# Patient Record
Sex: Male | Born: 1985 | Race: Black or African American | Hispanic: No
Health system: Southern US, Academic
[De-identification: ages and names within clinical notes are randomized; demographics above are authoritative.]

## PROBLEM LIST (undated history)

## (undated) ENCOUNTER — Encounter

## (undated) ENCOUNTER — Encounter: Attending: Nephrology | Primary: Nephrology

## (undated) ENCOUNTER — Ambulatory Visit

## (undated) ENCOUNTER — Telehealth

## (undated) ENCOUNTER — Ambulatory Visit: Payer: MEDICARE

## (undated) ENCOUNTER — Institutional Professional Consult (permissible substitution): Payer: MEDICARE

## (undated) ENCOUNTER — Ambulatory Visit: Payer: MEDICARE | Attending: Nephrology | Primary: Nephrology

## (undated) ENCOUNTER — Encounter: Attending: Internal Medicine | Primary: Internal Medicine

## (undated) ENCOUNTER — Ambulatory Visit: Payer: Medicare (Managed Care) | Attending: Nephrology | Primary: Nephrology

## (undated) DIAGNOSIS — Q8781 Alport syndrome: Secondary | ICD-10-CM

## (undated) DIAGNOSIS — D573 Sickle-cell trait: Secondary | ICD-10-CM

## (undated) DIAGNOSIS — R519 Headache, unspecified: Secondary | ICD-10-CM

## (undated) DIAGNOSIS — D649 Anemia, unspecified: Secondary | ICD-10-CM

## (undated) DIAGNOSIS — N19 Unspecified kidney failure: Secondary | ICD-10-CM

## (undated) DIAGNOSIS — D759 Disease of blood and blood-forming organs, unspecified: Secondary | ICD-10-CM

## (undated) DIAGNOSIS — R011 Cardiac murmur, unspecified: Secondary | ICD-10-CM

## (undated) DIAGNOSIS — K219 Gastro-esophageal reflux disease without esophagitis: Secondary | ICD-10-CM

## (undated) DIAGNOSIS — R51 Headache: Secondary | ICD-10-CM

## (undated) HISTORY — PX: KIDNEY TRANSPLANT: SHX239

## (undated) HISTORY — PX: NEPHRECTOMY TRANSPLANTED ORGAN: SUR880

## (undated) HISTORY — PX: AV FISTULA PLACEMENT: SHX1204

## (undated) MED ORDER — SODIUM BICARBONATE 650 MG TABLET: 1300 mg | tablet | Freq: Two times a day (BID) | 11 refills | 30 days | Status: CN

## (undated) MED ORDER — ACETAMINOPHEN 500 MG TABLET: tablet | Freq: Four times a day (QID) | 11 refills | 13 days | Status: CN

---
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
## 2005-11-04 ENCOUNTER — Encounter: Admission: RE | Admit: 2005-11-04 | Discharge: 2005-11-04 | Payer: Self-pay | Admitting: Nephrology

## 2006-11-18 ENCOUNTER — Emergency Department (HOSPITAL_COMMUNITY): Admission: EM | Admit: 2006-11-18 | Discharge: 2006-11-18 | Payer: Self-pay | Admitting: Emergency Medicine

## 2007-08-19 ENCOUNTER — Emergency Department (HOSPITAL_COMMUNITY): Admission: EM | Admit: 2007-08-19 | Discharge: 2007-08-19 | Payer: Self-pay | Admitting: Family Medicine

## 2007-08-26 ENCOUNTER — Encounter: Admission: RE | Admit: 2007-08-26 | Discharge: 2007-08-26 | Payer: Self-pay | Admitting: Nephrology

## 2007-10-21 ENCOUNTER — Emergency Department (HOSPITAL_COMMUNITY): Admission: EM | Admit: 2007-10-21 | Discharge: 2007-10-21 | Payer: Self-pay | Admitting: Family Medicine

## 2007-10-27 ENCOUNTER — Emergency Department (HOSPITAL_COMMUNITY): Admission: EM | Admit: 2007-10-27 | Discharge: 2007-10-27 | Payer: Self-pay | Admitting: Emergency Medicine

## 2009-05-27 ENCOUNTER — Emergency Department (HOSPITAL_COMMUNITY): Admission: EM | Admit: 2009-05-27 | Discharge: 2009-05-27 | Payer: Self-pay | Admitting: Emergency Medicine

## 2009-09-29 ENCOUNTER — Emergency Department (HOSPITAL_COMMUNITY): Admission: EM | Admit: 2009-09-29 | Discharge: 2009-09-30 | Payer: Self-pay | Admitting: Emergency Medicine

## 2009-11-11 ENCOUNTER — Emergency Department (HOSPITAL_COMMUNITY): Admission: EM | Admit: 2009-11-11 | Discharge: 2009-11-11 | Payer: Self-pay | Admitting: Emergency Medicine

## 2010-08-24 ENCOUNTER — Emergency Department (HOSPITAL_COMMUNITY)
Admission: EM | Admit: 2010-08-24 | Discharge: 2010-08-24 | Disposition: A | Discharge status: Home / Self Care | Payer: Medicare Other | Attending: Emergency Medicine | Admitting: Emergency Medicine

## 2010-08-24 DIAGNOSIS — Q898 Other specified congenital malformations: Secondary | ICD-10-CM | POA: Insufficient documentation

## 2010-08-24 DIAGNOSIS — M79609 Pain in unspecified limb: Secondary | ICD-10-CM | POA: Insufficient documentation

## 2010-08-24 DIAGNOSIS — Z940 Kidney transplant status: Secondary | ICD-10-CM | POA: Insufficient documentation

## 2010-08-24 DIAGNOSIS — M7989 Other specified soft tissue disorders: Secondary | ICD-10-CM | POA: Insufficient documentation

## 2010-08-24 LAB — URINALYSIS, ROUTINE W REFLEX MICROSCOPIC
Bilirubin Urine: NEGATIVE
Hgb urine dipstick: NEGATIVE
Ketones, ur: NEGATIVE mg/dL
Protein, ur: NEGATIVE mg/dL
Specific Gravity, Urine: 1.012 (ref 1.005–1.030)
Urobilinogen, UA: 0.2 mg/dL (ref 0.0–1.0)

## 2010-08-24 LAB — BASIC METABOLIC PANEL
CO2: 26 mEq/L (ref 19–32)
Calcium: 9.6 mg/dL (ref 8.4–10.5)
Creatinine, Ser: 1.63 mg/dL — ABNORMAL HIGH (ref 0.4–1.5)
GFR calc Af Amer: >60 mL/min (ref >60)
Glucose, Bld: 73 mg/dL (ref 70–99)
Sodium: 137 mEq/L (ref 135–145)

## 2010-09-03 LAB — DIFFERENTIAL
Basophils Absolute: 0.1 10*3/uL (ref 0.0–0.1)
Eosinophils Absolute: 0.4 10*3/uL (ref 0.0–0.7)
Lymphocytes Relative: 27 % (ref 12–46)
Monocytes Absolute: 0.6 10*3/uL (ref 0.1–1.0)
Neutro Abs: 2.8 10*3/uL (ref 1.7–7.7)
Neutrophils Relative %: 53 % (ref 43–77)

## 2010-09-03 LAB — BASIC METABOLIC PANEL
CO2: 27 mEq/L (ref 19–32)
Calcium: 9.7 mg/dL (ref 8.4–10.5)
Creatinine, Ser: 1.4 mg/dL (ref 0.4–1.5)
Glucose, Bld: 102 mg/dL — ABNORMAL HIGH (ref 70–99)

## 2010-09-03 LAB — CBC
MCHC: 33.8 g/dL (ref 30.0–36.0)
RDW: 13.5 % (ref 11.5–15.5)

## 2010-09-17 LAB — CBC
Hemoglobin: 13.7 g/dL (ref 13.0–17.0)
MCHC: 33.2 g/dL (ref 30.0–36.0)
RBC: 4.92 MIL/uL (ref 4.22–5.81)
WBC: 12.6 10*3/uL — ABNORMAL HIGH (ref 4.0–10.5)

## 2010-09-17 LAB — URINE CULTURE: Culture: NO GROWTH

## 2010-09-17 LAB — COMPREHENSIVE METABOLIC PANEL
ALT: 17 U/L (ref 0–53)
AST: 17 U/L (ref 0–37)
Alkaline Phosphatase: 155 U/L — ABNORMAL HIGH (ref 39–117)
CO2: 27 mEq/L (ref 19–32)
Chloride: 105 mEq/L (ref 96–112)
GFR calc non Af Amer: 58 mL/min — ABNORMAL LOW (ref >60)
Glucose, Bld: 107 mg/dL — ABNORMAL HIGH (ref 70–99)
Potassium: 4.1 mEq/L (ref 3.5–5.1)
Sodium: 139 mEq/L (ref 135–145)
Total Bilirubin: 0.5 mg/dL (ref 0.3–1.2)

## 2010-09-17 LAB — CULTURE, BLOOD (ROUTINE X 2): Culture: NO GROWTH

## 2010-09-17 LAB — DIFFERENTIAL
Basophils Relative: 0 % (ref 0–1)
Eosinophils Absolute: 0.1 10*3/uL (ref 0.0–0.7)
Eosinophils Relative: 1 % (ref 0–5)
Neutrophils Relative %: 88 % — ABNORMAL HIGH (ref 43–77)

## 2010-09-17 LAB — URINALYSIS, ROUTINE W REFLEX MICROSCOPIC
Bilirubin Urine: NEGATIVE
Glucose, UA: NEGATIVE mg/dL
Ketones, ur: NEGATIVE mg/dL
Protein, ur: NEGATIVE mg/dL

## 2010-09-17 LAB — LIPASE, BLOOD: Lipase: 18 U/L (ref 11–59)

## 2010-09-21 ENCOUNTER — Emergency Department (HOSPITAL_COMMUNITY)
Admission: EM | Admit: 2010-09-21 | Discharge: 2010-09-21 | Disposition: A | Discharge status: Home / Self Care | Payer: Worker's Compensation | Attending: Emergency Medicine | Admitting: Emergency Medicine

## 2010-09-21 ENCOUNTER — Emergency Department (HOSPITAL_COMMUNITY): Payer: Worker's Compensation

## 2010-09-21 DIAGNOSIS — Y929 Unspecified place or not applicable: Secondary | ICD-10-CM | POA: Insufficient documentation

## 2010-09-21 DIAGNOSIS — M25569 Pain in unspecified knee: Secondary | ICD-10-CM | POA: Insufficient documentation

## 2010-09-21 DIAGNOSIS — R220 Localized swelling, mass and lump, head: Secondary | ICD-10-CM | POA: Insufficient documentation

## 2010-09-21 DIAGNOSIS — Q898 Other specified congenital malformations: Secondary | ICD-10-CM | POA: Insufficient documentation

## 2010-09-21 DIAGNOSIS — R42 Dizziness and giddiness: Secondary | ICD-10-CM | POA: Insufficient documentation

## 2010-09-21 DIAGNOSIS — S060X0A Concussion without loss of consciousness, initial encounter: Secondary | ICD-10-CM | POA: Insufficient documentation

## 2010-09-21 DIAGNOSIS — R221 Localized swelling, mass and lump, neck: Secondary | ICD-10-CM | POA: Insufficient documentation

## 2010-09-21 DIAGNOSIS — Z79899 Other long term (current) drug therapy: Secondary | ICD-10-CM | POA: Insufficient documentation

## 2010-09-21 DIAGNOSIS — Y990 Civilian activity done for income or pay: Secondary | ICD-10-CM | POA: Insufficient documentation

## 2010-09-21 DIAGNOSIS — H538 Other visual disturbances: Secondary | ICD-10-CM | POA: Insufficient documentation

## 2010-09-21 DIAGNOSIS — R51 Headache: Secondary | ICD-10-CM | POA: Insufficient documentation

## 2010-09-21 DIAGNOSIS — X58XXXA Exposure to other specified factors, initial encounter: Secondary | ICD-10-CM | POA: Insufficient documentation

## 2010-10-15 ENCOUNTER — Ambulatory Visit: Payer: Medicare Other | Attending: Orthopedic Surgery | Admitting: Rehabilitative and Restorative Service Providers"

## 2010-10-15 DIAGNOSIS — IMO0001 Reserved for inherently not codable concepts without codable children: Secondary | ICD-10-CM | POA: Insufficient documentation

## 2010-10-15 DIAGNOSIS — M542 Cervicalgia: Secondary | ICD-10-CM | POA: Insufficient documentation

## 2010-10-15 DIAGNOSIS — M25569 Pain in unspecified knee: Secondary | ICD-10-CM | POA: Insufficient documentation

## 2010-10-21 ENCOUNTER — Ambulatory Visit: Payer: Medicare Other | Admitting: Physical Therapy

## 2010-10-23 ENCOUNTER — Ambulatory Visit: Payer: Medicare Other | Admitting: Physical Therapy

## 2010-10-28 ENCOUNTER — Encounter: Payer: Medicare Other | Admitting: Rehabilitative and Restorative Service Providers"

## 2010-10-30 ENCOUNTER — Ambulatory Visit: Payer: Medicare Other | Admitting: Physical Therapy

## 2010-11-01 NOTE — Op Note (Signed)
   NAME:  [name], [name]                         ACCOUNT NO.:  [id number]   MEDICAL RECORD NO.:  [id number]                   PATIENT TYPE:  OIB   LOCATION:  2899                                 FACILITY:  [facility]   PHYSICIAN:  Rosetta Posner, M.D.                 DATE OF BIRTH:  Jan 06, 1986   DATE OF PROCEDURE:  01/27/2003  DATE OF DISCHARGE:                                 OPERATIVE REPORT   PREOPERATIVE DIAGNOSIS:  End stage renal disease.   POSTOPERATIVE DIAGNOSIS:  End stage renal disease.   PROCEDURE:  Creation of left wrist Cimino arteriovenous fistula.   SURGEON:  Rosetta Posner, M.D.   ASSISTANT:  Suzzanne Cloud, P.A.   ANESTHESIA:  LMA.   COMPLICATIONS:  None.   DISPOSITION:  To the recovery room stable.   INDICATIONS FOR PROCEDURE:  The patient is a 25 year old male who has  progressive renal insufficiency.  Preoperative vein mapping revealed small  to moderate size vein throughout the cephalic vein in his forearm and his  upper arm.  The plan had been for exploration of his antecubital space and  creation of a left upper arm fistula but more likely a forearm loop graft.   On induction of anesthesia, the patient was found to have better appearing  vein on the surface at the level of the forearm, therefore, the incision was  made between the level of the cephalic vein and the radial artery at the  wrist.  The cephalic vein was ligated distally and gently dilated and was of  better caliber.  A 3 mm probe passed very easily through the vein.  The  radial artery was exposed through the same incision and was occluded  proximally and distally and opened using Potts scissors.  The vein was cut  to the appropriate length, spatulated, and sewn end-to-side to the radial  artery.  #2 dilator passed easily through the radial artery.  There was  intense spasm in the vein itself and in the radial artery.  There was a  tributary branch somewhat higher on the wrist that was taken a  large amount  of the flow and this was clipped with  hemoclip.  The wounds were irrigated  with saline.  Hemostasis was with electrocautery.  The wounds were closed  with 3-0 Vicryl in the subcutaneous and subcuticular tissue.  Benzoin and  Steri-Strips were applied.                                               Rosetta Posner, M.D.    TFE/MEDQ  D:  01/27/2003  T:  01/27/2003  Job:  [id number]

## 2010-11-04 ENCOUNTER — Ambulatory Visit: Payer: Medicare Other | Admitting: Physical Therapy

## 2010-11-06 ENCOUNTER — Encounter: Payer: Medicare Other | Admitting: Physical Therapy

## 2010-12-05 ENCOUNTER — Emergency Department (HOSPITAL_COMMUNITY)
Admission: EM | Admit: 2010-12-05 | Discharge: 2010-12-05 | Disposition: A | Discharge status: Home / Self Care | Payer: Medicare Other | Attending: Emergency Medicine | Admitting: Emergency Medicine

## 2010-12-05 DIAGNOSIS — N19 Unspecified kidney failure: Secondary | ICD-10-CM | POA: Insufficient documentation

## 2010-12-05 DIAGNOSIS — Z79899 Other long term (current) drug therapy: Secondary | ICD-10-CM | POA: Insufficient documentation

## 2010-12-05 DIAGNOSIS — Q898 Other specified congenital malformations: Secondary | ICD-10-CM | POA: Insufficient documentation

## 2010-12-05 DIAGNOSIS — G43909 Migraine, unspecified, not intractable, without status migrainosus: Secondary | ICD-10-CM | POA: Insufficient documentation

## 2010-12-05 LAB — POCT I-STAT, CHEM 8
BUN: 20 mg/dL (ref 6–23)
Calcium, Ion: 1.26 mmol/L (ref 1.12–1.32)
Chloride: 109 mEq/L (ref 96–112)
HCT: 38 % — ABNORMAL LOW (ref 39.0–52.0)
Sodium: 140 mEq/L (ref 135–145)
TCO2: 23 mmol/L (ref 0–100)

## 2011-03-12 LAB — DIFFERENTIAL
Eosinophils Relative: 5
Lymphocytes Relative: 7 — ABNORMAL LOW
Lymphs Abs: 0.6 — ABNORMAL LOW
Monocytes Absolute: 0.8
Monocytes Relative: 8

## 2011-03-12 LAB — CBC
Hemoglobin: 13.9
MCHC: 32.5
RBC: 5.19
WBC: 9.1

## 2011-03-12 LAB — URINALYSIS, ROUTINE W REFLEX MICROSCOPIC
Bilirubin Urine: NEGATIVE
Glucose, UA: NEGATIVE
Ketones, ur: NEGATIVE
Specific Gravity, Urine: 1.006
pH: 7.5

## 2011-03-12 LAB — POCT I-STAT, CHEM 8
Creatinine, Ser: 9.9 — ABNORMAL HIGH
Hemoglobin: 15.3
Potassium: 3.9
Sodium: 139
TCO2: 26

## 2011-03-12 LAB — URINE MICROSCOPIC-ADD ON

## 2011-04-03 LAB — URINALYSIS, ROUTINE W REFLEX MICROSCOPIC
Glucose, UA: NEGATIVE
Ketones, ur: NEGATIVE
Protein, ur: >300 — AB
Urobilinogen, UA: 0.2

## 2011-04-03 LAB — I-STAT 8, (EC8 V) (CONVERTED LAB)
Acid-Base Excess: 5 — ABNORMAL HIGH
BUN: 25 — ABNORMAL HIGH
Bicarbonate: 30.8 — ABNORMAL HIGH
Chloride: 102
HCT: 38 — ABNORMAL LOW
Hemoglobin: 12.9 — ABNORMAL LOW
Operator id: 279831
Sodium: 137

## 2011-04-03 LAB — URINE MICROSCOPIC-ADD ON

## 2011-04-17 ENCOUNTER — Other Ambulatory Visit: Payer: Self-pay | Admitting: Neurology

## 2011-04-17 DIAGNOSIS — H9190 Unspecified hearing loss, unspecified ear: Secondary | ICD-10-CM

## 2011-04-17 DIAGNOSIS — I7789 Other specified disorders of arteries and arterioles: Secondary | ICD-10-CM

## 2011-04-17 DIAGNOSIS — R51 Headache: Secondary | ICD-10-CM

## 2011-04-17 DIAGNOSIS — Z940 Kidney transplant status: Secondary | ICD-10-CM

## 2011-04-21 ENCOUNTER — Ambulatory Visit
Admission: RE | Admit: 2011-04-21 | Discharge: 2011-04-21 | Disposition: A | Discharge status: Home / Self Care | Payer: Medicare Other | Source: Ambulatory Visit | Attending: Neurology | Admitting: Neurology

## 2011-04-21 DIAGNOSIS — R51 Headache: Secondary | ICD-10-CM

## 2011-04-21 DIAGNOSIS — I7789 Other specified disorders of arteries and arterioles: Secondary | ICD-10-CM

## 2011-04-21 DIAGNOSIS — Z940 Kidney transplant status: Secondary | ICD-10-CM

## 2011-04-21 DIAGNOSIS — H9190 Unspecified hearing loss, unspecified ear: Secondary | ICD-10-CM

## 2011-05-27 ENCOUNTER — Other Ambulatory Visit: Payer: Self-pay

## 2011-05-27 ENCOUNTER — Emergency Department (HOSPITAL_COMMUNITY)
Admission: EM | Admit: 2011-05-27 | Discharge: 2011-05-27 | Disposition: A | Discharge status: Home / Self Care | Payer: Medicare Other | Attending: Emergency Medicine | Admitting: Emergency Medicine

## 2011-05-27 DIAGNOSIS — R0989 Other specified symptoms and signs involving the circulatory and respiratory systems: Secondary | ICD-10-CM | POA: Insufficient documentation

## 2011-05-27 DIAGNOSIS — R059 Cough, unspecified: Secondary | ICD-10-CM | POA: Insufficient documentation

## 2011-05-27 DIAGNOSIS — R070 Pain in throat: Secondary | ICD-10-CM | POA: Insufficient documentation

## 2011-05-27 DIAGNOSIS — IMO0001 Reserved for inherently not codable concepts without codable children: Secondary | ICD-10-CM | POA: Insufficient documentation

## 2011-05-27 DIAGNOSIS — Z79899 Other long term (current) drug therapy: Secondary | ICD-10-CM | POA: Insufficient documentation

## 2011-05-27 DIAGNOSIS — R0682 Tachypnea, not elsewhere classified: Secondary | ICD-10-CM | POA: Insufficient documentation

## 2011-05-27 DIAGNOSIS — R05 Cough: Secondary | ICD-10-CM | POA: Insufficient documentation

## 2011-05-27 DIAGNOSIS — J3489 Other specified disorders of nose and nasal sinuses: Secondary | ICD-10-CM | POA: Insufficient documentation

## 2011-05-27 DIAGNOSIS — R197 Diarrhea, unspecified: Secondary | ICD-10-CM | POA: Insufficient documentation

## 2011-05-27 DIAGNOSIS — R079 Chest pain, unspecified: Secondary | ICD-10-CM | POA: Insufficient documentation

## 2011-05-27 DIAGNOSIS — R0609 Other forms of dyspnea: Secondary | ICD-10-CM | POA: Insufficient documentation

## 2011-05-27 DIAGNOSIS — F17200 Nicotine dependence, unspecified, uncomplicated: Secondary | ICD-10-CM | POA: Insufficient documentation

## 2011-05-27 DIAGNOSIS — M2550 Pain in unspecified joint: Secondary | ICD-10-CM | POA: Insufficient documentation

## 2011-05-27 DIAGNOSIS — R112 Nausea with vomiting, unspecified: Secondary | ICD-10-CM | POA: Insufficient documentation

## 2011-05-27 DIAGNOSIS — J111 Influenza due to unidentified influenza virus with other respiratory manifestations: Secondary | ICD-10-CM | POA: Insufficient documentation

## 2011-05-27 DIAGNOSIS — Q898 Other specified congenital malformations: Secondary | ICD-10-CM | POA: Insufficient documentation

## 2011-05-27 DIAGNOSIS — R509 Fever, unspecified: Secondary | ICD-10-CM | POA: Insufficient documentation

## 2011-05-27 DIAGNOSIS — R000 Tachycardia, unspecified: Secondary | ICD-10-CM | POA: Insufficient documentation

## 2011-05-27 HISTORY — DX: Alport syndrome: Q87.81

## 2011-05-27 MED ORDER — IPRATROPIUM BROMIDE 0.02 % IN SOLN
0.5000 mg | Freq: Once | RESPIRATORY_TRACT | Status: AC
Start: 2011-05-27 — End: 2011-05-27
  Administered 2011-05-27: 0.5 mg via RESPIRATORY_TRACT
  Filled 2011-05-27: qty 2.5

## 2011-05-27 MED ORDER — ALBUTEROL SULFATE (5 MG/ML) 0.5% IN NEBU
2.5000 mg | INHALATION_SOLUTION | Freq: Once | RESPIRATORY_TRACT | Status: AC
Start: 2011-05-27 — End: 2011-05-27
  Administered 2011-05-27: 5 mg via RESPIRATORY_TRACT
  Filled 2011-05-27: qty 1

## 2011-05-27 MED ORDER — HYDROCODONE-HOMATROPINE 5-1.5 MG/5ML PO SYRP: 5.0000 mL | ORAL_SOLUTION | Freq: Four times a day (QID) | ORAL | Status: AC | PRN

## 2011-05-27 MED ORDER — ACETAMINOPHEN 325 MG PO TABS
650.0000 mg | ORAL_TABLET | Freq: Once | ORAL | Status: AC
Start: 2011-05-27 — End: 2011-05-27
  Administered 2011-05-27: 650 mg via ORAL
  Filled 2011-05-27: qty 1

## 2011-05-27 NOTE — ED Provider Notes (Signed)
History     CSN: [id number] Arrival date & time: 05/27/2011  2:37 PM   First MD Initiated Contact with Patient 05/27/11 1735      Chief Complaint  Patient presents with  . Chest Pain    (Consider location/radiation/quality/duration/timing/severity/associated sxs/prior treatment) HPI 25 year old male comes in because of a tightness in his chest and difficulty breathing today. He has been sick for the last 3 days with rhinorrhea, sore throat, nausea, vomiting, diarrhea, arthralgias and myalgias. He had sick contacts prior to getting sick. He's had a subjective fever along with chills but no sweats. He's had a nonproductive cough. He is a cigarette smoker smoking half pack of cigarettes a day. He has not done anything for his symptoms other than taking over-the-counter Robitussin which has not helped. Nothing makes his symptoms better, nothing makes the symptoms worse. Symptoms are moderate. Past Medical History  Diagnosis Date  . Alport syndrome     Past Surgical History  Procedure Date  . Kidney transplant     No family history on file.  History  Substance Use Topics  . Smoking status: Current Everyday Smoker  . Smokeless tobacco: Not on file  . Alcohol Use: No      Review of Systems  Allergies  Review of patient's allergies indicates no known allergies.  Home Medications   Current Outpatient Rx  Name Route Sig Dispense Refill  . AMLODIPINE BESYLATE 10 MG PO TABS Oral Take 10 mg by mouth daily.      Marland Kitchen CALCITRIOL 0.25 MCG PO CAPS Oral Take 0.25 mcg by mouth daily.      Marland Kitchen ESZOPICLONE 2 MG PO TABS Oral Take 2 mg by mouth at bedtime. Take immediately before bedtime     . MYCOPHENOLATE SODIUM 180 MG PO TBEC Oral Take 540 mg by mouth 2 (two) times daily.      Marland Kitchen NORTRIPTYLINE HCL 10 MG PO CAPS Oral Take 10 mg by mouth at bedtime. Only took for 3 days last dose on Thursday     . OMEPRAZOLE 20 MG PO CPDR Oral Take 20 mg by mouth daily.      Marland Kitchen PREDNISONE 10 MG PO TABS Oral  Take 10 mg by mouth daily.      Marland Kitchen TACROLIMUS 1 MG PO CAPS Oral Take 5 mg by mouth 2 (two) times daily.      Marland Kitchen TACROLIMUS 5 MG PO CAPS Oral Take 5 mg by mouth 2 (two) times daily.        BP 150/85  Pulse 112  Temp(Src) 100.6 F (38.1 C) (Oral)  Resp 21  SpO2 100%  Physical Exam 25 year old male who is awake, alert, oriented and in no acute distress. Vital signs show he is febrile with temperature 100.6 and tachycardic with heart rate 112 and tachypnea with respiratory rate of 21. Oxygen saturation is an excellent 100% on room air. Head is normocephalic and atraumatic. PERRLA, EOMI. Oropharynx is clear. There is no sinus tenderness. Neck is supple without adenopathy or JVD. Back is nontender. There's no CVA tenderness. Lungs have a prolonged exhalation phase and slight wheezing is noted with cough. There is no chest wall tenderness. Heart has regular rate and rhythm without murmur. Abdomen is soft, flat, nontender without masses or hepatosplenomegaly. Extremities have no cyanosis or edema, full range of motion present. Skin is warm and moist without rash. Neurologic: He is awake, alert, oriented x3. Mental status is normal. Cranial nerves are intact. There no focal motor or sensory deficits. Psychiatric:  No abnormalities of mood or affect. ED Course  Procedures (including critical care time)  Labs Reviewed - No data to display No results found.   No diagnosis found.   Date: 05/27/2011  Rate: 113  Rhythm: sinus tachycardia  QRS Axis: right  Intervals: normal  ST/T Wave abnormalities: normal  Conduction Disutrbances:Incomplete right bundle-branch block  Narrative Interpretation: Sinus tachycardia with incomplete right bundle branch block. No old ECG available for comparison.  Old EKG Reviewed: none available  He was given an albuterol with Atrovent nebulizer treatment with minimal subjective improvement. He clinically has influenza. He will be treated symptomatically as an  outpatient.  MDM  Clinical influenza with secondary bronchospasm. He will be given an albuterol nebulizer treatment and also given acetaminophen for his fever. He is outside the window which would allow antiviral treatment for influenza.        Delora Fuel, MD 16/38/46 6599

## 2011-05-27 NOTE — ED Notes (Signed)
Pt c/o CP, cough, migraine, that began today

## 2011-06-08 ENCOUNTER — Emergency Department (HOSPITAL_COMMUNITY)
Admission: EM | Admit: 2011-06-08 | Discharge: 2011-06-09 | Disposition: A | Discharge status: Home / Self Care | Payer: Medicare Other | Attending: Emergency Medicine | Admitting: Emergency Medicine

## 2011-06-08 ENCOUNTER — Encounter (HOSPITAL_COMMUNITY): Payer: Self-pay | Admitting: Emergency Medicine

## 2011-06-08 DIAGNOSIS — Q898 Other specified congenital malformations: Secondary | ICD-10-CM | POA: Insufficient documentation

## 2011-06-08 DIAGNOSIS — K029 Dental caries, unspecified: Secondary | ICD-10-CM | POA: Insufficient documentation

## 2011-06-08 DIAGNOSIS — R220 Localized swelling, mass and lump, head: Secondary | ICD-10-CM | POA: Insufficient documentation

## 2011-06-08 DIAGNOSIS — Z79899 Other long term (current) drug therapy: Secondary | ICD-10-CM | POA: Insufficient documentation

## 2011-06-08 DIAGNOSIS — B370 Candidal stomatitis: Secondary | ICD-10-CM

## 2011-06-08 DIAGNOSIS — K0510 Chronic gingivitis, plaque induced: Secondary | ICD-10-CM

## 2011-06-08 MED ORDER — NYSTATIN 100000 UNIT/ML MT SUSP: 500000.0000 [IU] | Freq: Four times a day (QID) | OROMUCOSAL | Status: AC

## 2011-06-08 MED ORDER — CHLORHEXIDINE GLUCONATE 0.12 % MT SOLN: 15.0000 mL | Freq: Two times a day (BID) | OROMUCOSAL | Status: AC

## 2011-06-08 MED ORDER — CLINDAMYCIN HCL 150 MG PO CAPS: ORAL_CAPSULE | ORAL | Status: AC

## 2011-06-08 NOTE — ED Provider Notes (Signed)
History     CSN: [id number]  Arrival date & time 06/08/11  2159    Chief Complaint  Patient presents with  . Dental Problem    HPI Pt was seen at 2325.  Per pt, c/o gradual onset and persistence of constant widespread gums "swelling," sensitivity, and "bleeding" when he brushes his teeth x1-2 weeks.  Pt states he stopped brushing his teeth due to symptoms.  Also c/o gradual onset and persistence of constant "white coating on tongue" for the past several weeks.  Denies fevers, no rash, no facial swelling, no dysphagia, no neck pain.         Past Medical History  Diagnosis Date  . Alport syndrome   . Alport syndrome     Past Surgical History  Procedure Date  . Kidney transplant   . Kidney transplant   . Nephrectomy transplanted organ     Family History  Problem Relation Age of Onset  . Cancer Mother     History  Substance Use Topics  . Smoking status: Current Everyday Smoker  . Smokeless tobacco: Not on file  . Alcohol Use: No    Review of Systems ROS: Statement: All systems negative except as marked or noted in the HPI; Constitutional: Negative for fever and chills. ; ; Eyes: Negative for eye pain and discharge. ; ; ENMT: Positive for dental hygiene poor, gums swelling/bleeding/sensitivity, +thrush.  Negative for specific dental pain, ear pain, dental injury, facial deformity, facial swelling, hoarseness, nasal congestion, sinus pressure, sore throat, throat swelling and tongue swollen. ; ; Cardiovascular: Negative for chest pain, palpitations, diaphoresis, dyspnea and peripheral edema. ; ; Respiratory: Negative for cough, wheezing and stridor. ; ; Gastrointestinal: Negative for nausea, vomiting, diarrhea and abdominal pain. ; ; Genitourinary: Negative for dysuria, flank pain and hematuria. ; ; Musculoskeletal: Negative for back pain and neck pain. ; ; Skin: Negative for rash and skin lesion. ; ; Neuro: Negative for headache, lightheadedness and neck stiffness. ;     Allergies  Review of patient's allergies indicates no known allergies.  Home Medications   Current Outpatient Rx  Name Route Sig Dispense Refill  . AMLODIPINE BESYLATE 10 MG PO TABS Oral Take 10 mg by mouth daily.      Marland Kitchen CALCITRIOL 0.25 MCG PO CAPS Oral Take 0.25 mcg by mouth daily.      Marland Kitchen ESZOPICLONE 2 MG PO TABS Oral Take 2 mg by mouth at bedtime as needed. Take immediately before bedtime; for sleep    . MYCOPHENOLATE SODIUM 180 MG PO TBEC Oral Take 540 mg by mouth 2 (two) times daily.     Marland Kitchen OMEPRAZOLE 20 MG PO CPDR Oral Take 20 mg by mouth daily.      Marland Kitchen PREDNISONE 10 MG PO TABS Oral Take 10 mg by mouth daily.      Marland Kitchen TACROLIMUS 1 MG PO CAPS Oral Take 5 mg by mouth 2 (two) times daily. With $RemoveBef'5mg'uTpOuaasSq$  capsule for total dose of $Remov'10mg'PGdqpe$  twice a day    . TACROLIMUS 5 MG PO CAPS Oral Take 5 mg by mouth 2 (two) times daily.      . CHLORHEXIDINE GLUCONATE 0.12 % MT SOLN Mouth/Throat Use as directed 15 mLs in the mouth or throat 2 (two) times daily. Swish and spit 240 mL 0  . CLINDAMYCIN HCL 150 MG PO CAPS  3 tabs PO TID x 10 days 90 capsule 0  . NYSTATIN 100000 UNIT/ML MT SUSP Oral Take 5 mLs (500,000 Units total) by mouth  4 (four) times daily. For 14 days (Swish and swallow over 20 minutes) 480 mL 0    BP 128/76  Pulse 101  Temp(Src) 98.5 F (36.9 C) (Oral)  Resp 16  SpO2 99%  Physical Exam 2330: Physical examination: Vital signs and O2 SAT: Reviewed; Constitutional: Well developed, Well nourished, Well hydrated, In no acute distress; Head and Face: Normocephalic, Atraumatic; Eyes: EOMI, PERRL, No scleral icterus; ENMT: Mouth and pharynx normal, Poor dentition, Widespread dental decay, Left TM normal, Right TM normal, Mucous membranes moist, +thrush on tongue, +gums mildly inflamed and erythemetous.  No active bleeding.  No gingival fluctuance or drainage.  No hoarse voice, no drooling, no stridor, no trismus.; Neck: Supple, Full range of motion, No lymphadenopathy; Cardiovascular: Regular rate  and rhythm, No murmur, rub, or gallop; Respiratory: Breath sounds clear & equal bilaterally, No rales, rhonchi, wheezes, or rub, Normal respiratory effort/excursion; Chest: Nontender, Movement normal; Extremities: Pulses normal, No tenderness, No edema; Neuro: AA&Ox3, Major CN grossly intact.  No gross focal motor or sensory deficits in extremities.; Skin: Color normal, No rash, No petechiae, Warm, Dry.    ED Course  Procedures    MDM  MDM Reviewed: nursing note, vitals and previous chart    No fevers in ED, VS stable.  Resps easy, wants to go home now.  Has a dentist appt on Friday.     [place], DO 06/11/11 1929

## 2011-06-08 NOTE — ED Notes (Signed)
Pt stated about a week ago his gums started swelling and were sensitive.  Pt's tongue is white and lesions are present on pt's bottom lip.  Pt stated he had blood clots on his gums when he woke up.

## 2011-07-14 DIAGNOSIS — Z940 Kidney transplant status: Secondary | ICD-10-CM | POA: Diagnosis not present

## 2011-07-14 DIAGNOSIS — D649 Anemia, unspecified: Secondary | ICD-10-CM | POA: Diagnosis not present

## 2011-07-14 DIAGNOSIS — I10 Essential (primary) hypertension: Secondary | ICD-10-CM | POA: Diagnosis not present

## 2011-07-14 DIAGNOSIS — N2581 Secondary hyperparathyroidism of renal origin: Secondary | ICD-10-CM | POA: Diagnosis not present

## 2011-10-01 DIAGNOSIS — Z79899 Other long term (current) drug therapy: Secondary | ICD-10-CM | POA: Diagnosis not present

## 2011-10-01 DIAGNOSIS — Z940 Kidney transplant status: Secondary | ICD-10-CM | POA: Diagnosis not present

## 2011-10-02 DIAGNOSIS — Z79899 Other long term (current) drug therapy: Secondary | ICD-10-CM | POA: Diagnosis not present

## 2011-10-02 DIAGNOSIS — Z940 Kidney transplant status: Secondary | ICD-10-CM | POA: Diagnosis not present

## 2011-10-07 DIAGNOSIS — Z79899 Other long term (current) drug therapy: Secondary | ICD-10-CM | POA: Diagnosis not present

## 2011-10-07 DIAGNOSIS — Z940 Kidney transplant status: Secondary | ICD-10-CM | POA: Diagnosis not present

## 2011-10-07 DIAGNOSIS — Z09 Encounter for follow-up examination after completed treatment for conditions other than malignant neoplasm: Secondary | ICD-10-CM | POA: Diagnosis not present

## 2011-10-07 DIAGNOSIS — E559 Vitamin D deficiency, unspecified: Secondary | ICD-10-CM | POA: Diagnosis not present

## 2011-10-30 DIAGNOSIS — E213 Hyperparathyroidism, unspecified: Secondary | ICD-10-CM | POA: Diagnosis not present

## 2011-10-30 DIAGNOSIS — N2581 Secondary hyperparathyroidism of renal origin: Secondary | ICD-10-CM | POA: Diagnosis not present

## 2011-10-30 DIAGNOSIS — Z940 Kidney transplant status: Secondary | ICD-10-CM | POA: Diagnosis not present

## 2011-10-30 DIAGNOSIS — I10 Essential (primary) hypertension: Secondary | ICD-10-CM | POA: Diagnosis not present

## 2012-03-29 DIAGNOSIS — Z23 Encounter for immunization: Secondary | ICD-10-CM | POA: Diagnosis not present

## 2012-03-29 DIAGNOSIS — I10 Essential (primary) hypertension: Secondary | ICD-10-CM | POA: Diagnosis not present

## 2012-03-29 DIAGNOSIS — N2581 Secondary hyperparathyroidism of renal origin: Secondary | ICD-10-CM | POA: Diagnosis not present

## 2012-03-29 DIAGNOSIS — E213 Hyperparathyroidism, unspecified: Secondary | ICD-10-CM | POA: Diagnosis not present

## 2012-03-29 DIAGNOSIS — Z940 Kidney transplant status: Secondary | ICD-10-CM | POA: Diagnosis not present

## 2012-03-29 DIAGNOSIS — K279 Peptic ulcer, site unspecified, unspecified as acute or chronic, without hemorrhage or perforation: Secondary | ICD-10-CM | POA: Diagnosis not present

## 2012-04-13 DIAGNOSIS — Z940 Kidney transplant status: Secondary | ICD-10-CM | POA: Diagnosis not present

## 2012-05-06 ENCOUNTER — Encounter (HOSPITAL_COMMUNITY): Payer: Self-pay | Admitting: Physical Medicine and Rehabilitation

## 2012-05-06 ENCOUNTER — Emergency Department (HOSPITAL_COMMUNITY): Payer: Medicare Other

## 2012-05-06 ENCOUNTER — Emergency Department (HOSPITAL_COMMUNITY)
Admission: EM | Admit: 2012-05-06 | Discharge: 2012-05-06 | Disposition: A | Discharge status: Home / Self Care | Payer: Medicare Other | Attending: Emergency Medicine | Admitting: Emergency Medicine

## 2012-05-06 DIAGNOSIS — Z79899 Other long term (current) drug therapy: Secondary | ICD-10-CM | POA: Insufficient documentation

## 2012-05-06 DIAGNOSIS — F17200 Nicotine dependence, unspecified, uncomplicated: Secondary | ICD-10-CM | POA: Diagnosis not present

## 2012-05-06 DIAGNOSIS — R079 Chest pain, unspecified: Secondary | ICD-10-CM | POA: Diagnosis not present

## 2012-05-06 DIAGNOSIS — M25519 Pain in unspecified shoulder: Secondary | ICD-10-CM | POA: Diagnosis not present

## 2012-05-06 DIAGNOSIS — S199XXA Unspecified injury of neck, initial encounter: Secondary | ICD-10-CM | POA: Diagnosis not present

## 2012-05-06 DIAGNOSIS — S0993XA Unspecified injury of face, initial encounter: Secondary | ICD-10-CM | POA: Diagnosis not present

## 2012-05-06 DIAGNOSIS — M25569 Pain in unspecified knee: Secondary | ICD-10-CM | POA: Diagnosis not present

## 2012-05-06 DIAGNOSIS — M542 Cervicalgia: Secondary | ICD-10-CM | POA: Diagnosis not present

## 2012-05-06 DIAGNOSIS — S298XXA Other specified injuries of thorax, initial encounter: Secondary | ICD-10-CM | POA: Diagnosis not present

## 2012-05-06 DIAGNOSIS — S8990XA Unspecified injury of unspecified lower leg, initial encounter: Secondary | ICD-10-CM | POA: Diagnosis not present

## 2012-05-06 DIAGNOSIS — Q898 Other specified congenital malformations: Secondary | ICD-10-CM | POA: Diagnosis not present

## 2012-05-06 DIAGNOSIS — S4980XA Other specified injuries of shoulder and upper arm, unspecified arm, initial encounter: Secondary | ICD-10-CM | POA: Diagnosis not present

## 2012-05-06 DIAGNOSIS — Y9389 Activity, other specified: Secondary | ICD-10-CM | POA: Insufficient documentation

## 2012-05-06 DIAGNOSIS — M79609 Pain in unspecified limb: Secondary | ICD-10-CM | POA: Diagnosis not present

## 2012-05-06 MED ORDER — OXYCODONE-ACETAMINOPHEN 5-325 MG PO TABS: 2.0000 | ORAL_TABLET | ORAL | Status: DC | PRN

## 2012-05-06 MED ORDER — OXYCODONE-ACETAMINOPHEN 5-325 MG PO TABS
2.0000 | ORAL_TABLET | Freq: Once | ORAL | Status: AC
  Administered 2012-05-06: 2 via ORAL
  Filled 2012-05-06: qty 2

## 2012-05-06 NOTE — ED Notes (Signed)
Patient transported to X-ray 

## 2012-05-06 NOTE — ED Provider Notes (Signed)
All xrays negative.  Results discussed w/ pt.  Ortho tech placed in shoulder sling.  Pt d/c'd home.    Remer Macho, [place] 05/06/12 2119
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
## 2012-05-06 NOTE — ED Notes (Signed)
Ortho notified for sling immobilizer
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
## 2012-05-06 NOTE — Progress Notes (Signed)
Orthopedic Tech Progress Note Patient Details:  [name] 10-30-1985 [id number]  Ortho Devices Type of Ortho Device: Arm foam sling Ortho Device/Splint Location: (L) UE Ortho Device/Splint Interventions: Application   Braulio Bosch 05/06/2012, 9:31 PM

## 2012-05-07 NOTE — ED Provider Notes (Signed)
Medical screening examination/treatment/procedure(s) were conducted as a shared visit with non-physician practitioner(s) and myself.  I personally evaluated the patient during the encounter  Barbara Cower, MD 05/07/12 [phone]

## 2012-06-21 DIAGNOSIS — D649 Anemia, unspecified: Secondary | ICD-10-CM | POA: Diagnosis not present

## 2012-06-21 DIAGNOSIS — I10 Essential (primary) hypertension: Secondary | ICD-10-CM | POA: Diagnosis not present

## 2012-06-21 DIAGNOSIS — E213 Hyperparathyroidism, unspecified: Secondary | ICD-10-CM | POA: Diagnosis not present

## 2012-06-21 DIAGNOSIS — D509 Iron deficiency anemia, unspecified: Secondary | ICD-10-CM | POA: Diagnosis not present

## 2012-06-21 DIAGNOSIS — N2581 Secondary hyperparathyroidism of renal origin: Secondary | ICD-10-CM | POA: Diagnosis not present

## 2012-06-21 DIAGNOSIS — Z940 Kidney transplant status: Secondary | ICD-10-CM | POA: Diagnosis not present

## 2012-06-22 DIAGNOSIS — B360 Pityriasis versicolor: Secondary | ICD-10-CM | POA: Diagnosis not present

## 2012-06-24 DIAGNOSIS — T86899 Unspecified complication of other transplanted tissue: Secondary | ICD-10-CM | POA: Diagnosis not present

## 2012-06-24 DIAGNOSIS — Z79899 Other long term (current) drug therapy: Secondary | ICD-10-CM | POA: Diagnosis not present

## 2012-06-24 DIAGNOSIS — Z940 Kidney transplant status: Secondary | ICD-10-CM | POA: Diagnosis not present

## 2012-06-24 DIAGNOSIS — Z09 Encounter for follow-up examination after completed treatment for conditions other than malignant neoplasm: Secondary | ICD-10-CM | POA: Diagnosis not present

## 2012-06-24 DIAGNOSIS — E559 Vitamin D deficiency, unspecified: Secondary | ICD-10-CM | POA: Diagnosis not present

## 2012-09-22 DIAGNOSIS — Z79899 Other long term (current) drug therapy: Secondary | ICD-10-CM | POA: Diagnosis not present

## 2012-09-22 DIAGNOSIS — Z940 Kidney transplant status: Secondary | ICD-10-CM | POA: Diagnosis not present

## 2012-09-22 DIAGNOSIS — I10 Essential (primary) hypertension: Secondary | ICD-10-CM | POA: Diagnosis not present

## 2012-11-15 ENCOUNTER — Encounter (HOSPITAL_COMMUNITY): Payer: Self-pay | Admitting: *Deleted

## 2012-11-15 ENCOUNTER — Emergency Department (HOSPITAL_COMMUNITY): Payer: Worker's Compensation

## 2012-11-15 ENCOUNTER — Emergency Department (HOSPITAL_COMMUNITY)
Admission: EM | Admit: 2012-11-15 | Discharge: 2012-11-15 | Disposition: A | Discharge status: Home / Self Care | Payer: Worker's Compensation | Attending: Emergency Medicine | Admitting: Emergency Medicine

## 2012-11-15 DIAGNOSIS — IMO0002 Reserved for concepts with insufficient information to code with codable children: Secondary | ICD-10-CM | POA: Insufficient documentation

## 2012-11-15 DIAGNOSIS — Z940 Kidney transplant status: Secondary | ICD-10-CM | POA: Insufficient documentation

## 2012-11-15 DIAGNOSIS — S6000XA Contusion of unspecified finger without damage to nail, initial encounter: Secondary | ICD-10-CM | POA: Insufficient documentation

## 2012-11-15 DIAGNOSIS — S6991XA Unspecified injury of right wrist, hand and finger(s), initial encounter: Secondary | ICD-10-CM

## 2012-11-15 DIAGNOSIS — S6990XA Unspecified injury of unspecified wrist, hand and finger(s), initial encounter: Secondary | ICD-10-CM | POA: Insufficient documentation

## 2012-11-15 DIAGNOSIS — S6010XA Contusion of unspecified finger with damage to nail, initial encounter: Secondary | ICD-10-CM

## 2012-11-15 DIAGNOSIS — Y9389 Activity, other specified: Secondary | ICD-10-CM | POA: Insufficient documentation

## 2012-11-15 DIAGNOSIS — Z79899 Other long term (current) drug therapy: Secondary | ICD-10-CM | POA: Insufficient documentation

## 2012-11-15 DIAGNOSIS — W2209XA Striking against other stationary object, initial encounter: Secondary | ICD-10-CM | POA: Insufficient documentation

## 2012-11-15 DIAGNOSIS — Y929 Unspecified place or not applicable: Secondary | ICD-10-CM | POA: Insufficient documentation

## 2012-11-15 DIAGNOSIS — F17200 Nicotine dependence, unspecified, uncomplicated: Secondary | ICD-10-CM | POA: Insufficient documentation

## 2012-11-15 DIAGNOSIS — R209 Unspecified disturbances of skin sensation: Secondary | ICD-10-CM | POA: Insufficient documentation

## 2012-11-15 DIAGNOSIS — Q898 Other specified congenital malformations: Secondary | ICD-10-CM | POA: Insufficient documentation
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
## 2012-11-15 NOTE — ED Notes (Signed)
Pt slammed right thumb in door and now with pain in his palm, skin intact

## 2012-11-16 NOTE — ED Provider Notes (Signed)
Medical screening examination/treatment/procedure(s) were performed by non-physician practitioner and as supervising physician I was immediately available for consultation/collaboration. Rolland Porter, MD, Abram Sander   [name], MD 11/16/12 [phone]

## 2012-11-23 NOTE — ED Provider Notes (Signed)
Medical screening examination/treatment/procedure(s) were performed by non-physician practitioner and as supervising physician I was immediately available for consultation/collaboration. Rolland Porter, MD, Abram Sander   [name], MD 11/23/12 [phone]

## 2013-01-08 DIAGNOSIS — D638 Anemia in other chronic diseases classified elsewhere: Secondary | ICD-10-CM | POA: Diagnosis not present

## 2013-01-08 DIAGNOSIS — R197 Diarrhea, unspecified: Secondary | ICD-10-CM | POA: Diagnosis not present

## 2013-01-08 DIAGNOSIS — R079 Chest pain, unspecified: Secondary | ICD-10-CM | POA: Diagnosis not present

## 2013-01-08 DIAGNOSIS — Z79899 Other long term (current) drug therapy: Secondary | ICD-10-CM | POA: Diagnosis not present

## 2013-01-08 DIAGNOSIS — N179 Acute kidney failure, unspecified: Secondary | ICD-10-CM | POA: Diagnosis not present

## 2013-01-08 DIAGNOSIS — R5381 Other malaise: Secondary | ICD-10-CM | POA: Diagnosis not present

## 2013-01-08 DIAGNOSIS — I959 Hypotension, unspecified: Secondary | ICD-10-CM | POA: Diagnosis not present

## 2013-01-08 DIAGNOSIS — E872 Acidosis, unspecified: Secondary | ICD-10-CM | POA: Diagnosis not present

## 2013-01-08 DIAGNOSIS — S3981XA Other specified injuries of abdomen, initial encounter: Secondary | ICD-10-CM | POA: Diagnosis not present

## 2013-01-08 DIAGNOSIS — K219 Gastro-esophageal reflux disease without esophagitis: Secondary | ICD-10-CM | POA: Diagnosis not present

## 2013-01-08 DIAGNOSIS — T8610 Unspecified complication of kidney transplant: Secondary | ICD-10-CM | POA: Diagnosis not present

## 2013-01-08 DIAGNOSIS — Z940 Kidney transplant status: Secondary | ICD-10-CM | POA: Diagnosis not present

## 2013-01-08 DIAGNOSIS — Z9119 Patient's noncompliance with other medical treatment and regimen: Secondary | ICD-10-CM | POA: Diagnosis not present

## 2013-01-08 DIAGNOSIS — Q898 Other specified congenital malformations: Secondary | ICD-10-CM | POA: Diagnosis not present

## 2013-01-08 DIAGNOSIS — I499 Cardiac arrhythmia, unspecified: Secondary | ICD-10-CM | POA: Diagnosis not present

## 2013-01-08 DIAGNOSIS — I10 Essential (primary) hypertension: Secondary | ICD-10-CM | POA: Diagnosis not present

## 2013-01-08 DIAGNOSIS — D649 Anemia, unspecified: Secondary | ICD-10-CM | POA: Diagnosis not present

## 2013-01-08 DIAGNOSIS — IMO0002 Reserved for concepts with insufficient information to code with codable children: Secondary | ICD-10-CM | POA: Diagnosis not present

## 2013-01-08 DIAGNOSIS — N2889 Other specified disorders of kidney and ureter: Secondary | ICD-10-CM | POA: Diagnosis not present

## 2013-01-08 DIAGNOSIS — Z87891 Personal history of nicotine dependence: Secondary | ICD-10-CM | POA: Diagnosis not present

## 2013-01-08 DIAGNOSIS — E875 Hyperkalemia: Secondary | ICD-10-CM | POA: Diagnosis not present

## 2013-01-08 DIAGNOSIS — R319 Hematuria, unspecified: Secondary | ICD-10-CM | POA: Diagnosis not present

## 2013-01-08 DIAGNOSIS — N170 Acute kidney failure with tubular necrosis: Secondary | ICD-10-CM | POA: Diagnosis not present

## 2013-01-08 DIAGNOSIS — I2129 ST elevation (STEMI) myocardial infarction involving other sites: Secondary | ICD-10-CM | POA: Diagnosis not present

## 2013-01-27 DIAGNOSIS — Z79899 Other long term (current) drug therapy: Secondary | ICD-10-CM | POA: Diagnosis not present

## 2013-01-27 DIAGNOSIS — Z940 Kidney transplant status: Secondary | ICD-10-CM | POA: Diagnosis not present

## 2013-01-27 DIAGNOSIS — Z48298 Encounter for aftercare following other organ transplant: Secondary | ICD-10-CM | POA: Diagnosis not present

## 2013-01-27 DIAGNOSIS — Z5181 Encounter for therapeutic drug level monitoring: Secondary | ICD-10-CM | POA: Diagnosis not present

## 2013-02-01 DIAGNOSIS — Z940 Kidney transplant status: Secondary | ICD-10-CM | POA: Diagnosis not present

## 2013-02-01 DIAGNOSIS — R948 Abnormal results of function studies of other organs and systems: Secondary | ICD-10-CM | POA: Diagnosis not present

## 2013-02-01 DIAGNOSIS — N270 Small kidney, unilateral: Secondary | ICD-10-CM | POA: Diagnosis not present

## 2013-02-01 DIAGNOSIS — Z0000 Encounter for general adult medical examination without abnormal findings: Secondary | ICD-10-CM | POA: Diagnosis not present

## 2013-02-01 DIAGNOSIS — N189 Chronic kidney disease, unspecified: Secondary | ICD-10-CM | POA: Diagnosis not present

## 2013-02-01 DIAGNOSIS — M899 Disorder of bone, unspecified: Secondary | ICD-10-CM | POA: Diagnosis not present

## 2013-02-01 DIAGNOSIS — IMO0002 Reserved for concepts with insufficient information to code with codable children: Secondary | ICD-10-CM | POA: Diagnosis not present

## 2013-02-01 DIAGNOSIS — Z48298 Encounter for aftercare following other organ transplant: Secondary | ICD-10-CM | POA: Diagnosis not present

## 2013-02-07 DIAGNOSIS — Z79899 Other long term (current) drug therapy: Secondary | ICD-10-CM | POA: Diagnosis not present

## 2013-02-07 DIAGNOSIS — T86899 Unspecified complication of other transplanted tissue: Secondary | ICD-10-CM | POA: Diagnosis not present

## 2013-02-07 DIAGNOSIS — Z09 Encounter for follow-up examination after completed treatment for conditions other than malignant neoplasm: Secondary | ICD-10-CM | POA: Diagnosis not present

## 2013-02-07 DIAGNOSIS — Z940 Kidney transplant status: Secondary | ICD-10-CM | POA: Diagnosis not present

## 2013-02-07 DIAGNOSIS — E559 Vitamin D deficiency, unspecified: Secondary | ICD-10-CM | POA: Diagnosis not present

## 2013-02-08 DIAGNOSIS — Z79899 Other long term (current) drug therapy: Secondary | ICD-10-CM | POA: Diagnosis not present

## 2013-02-08 DIAGNOSIS — D649 Anemia, unspecified: Secondary | ICD-10-CM | POA: Diagnosis not present

## 2013-02-08 DIAGNOSIS — Z940 Kidney transplant status: Secondary | ICD-10-CM | POA: Diagnosis not present

## 2013-02-08 NOTE — Unmapped (Signed)
Per [employer] no urine is needed for this visit even though orders are in the system. The patient is only here for lab results.

## 2013-02-16 DIAGNOSIS — Z48298 Encounter for aftercare following other organ transplant: Secondary | ICD-10-CM | POA: Diagnosis not present

## 2013-02-16 DIAGNOSIS — Z940 Kidney transplant status: Secondary | ICD-10-CM | POA: Diagnosis not present

## 2013-02-16 DIAGNOSIS — N179 Acute kidney failure, unspecified: Secondary | ICD-10-CM | POA: Diagnosis not present

## 2013-02-16 DIAGNOSIS — N19 Unspecified kidney failure: Secondary | ICD-10-CM | POA: Diagnosis not present

## 2013-02-17 DIAGNOSIS — T8610 Unspecified complication of kidney transplant: Secondary | ICD-10-CM | POA: Diagnosis not present

## 2013-02-17 DIAGNOSIS — Z940 Kidney transplant status: Secondary | ICD-10-CM | POA: Diagnosis not present

## 2013-02-17 DIAGNOSIS — I10 Essential (primary) hypertension: Secondary | ICD-10-CM | POA: Diagnosis not present

## 2013-02-17 DIAGNOSIS — D649 Anemia, unspecified: Secondary | ICD-10-CM | POA: Diagnosis not present

## 2013-02-18 DIAGNOSIS — I129 Hypertensive chronic kidney disease with stage 1 through stage 4 chronic kidney disease, or unspecified chronic kidney disease: Secondary | ICD-10-CM | POA: Diagnosis present

## 2013-02-18 DIAGNOSIS — B360 Pityriasis versicolor: Secondary | ICD-10-CM | POA: Diagnosis present

## 2013-02-18 DIAGNOSIS — IMO0002 Reserved for concepts with insufficient information to code with codable children: Secondary | ICD-10-CM | POA: Diagnosis not present

## 2013-02-18 DIAGNOSIS — R7989 Other specified abnormal findings of blood chemistry: Secondary | ICD-10-CM | POA: Diagnosis not present

## 2013-02-18 DIAGNOSIS — K219 Gastro-esophageal reflux disease without esophagitis: Secondary | ICD-10-CM | POA: Diagnosis present

## 2013-02-18 DIAGNOSIS — E875 Hyperkalemia: Secondary | ICD-10-CM | POA: Diagnosis not present

## 2013-02-18 DIAGNOSIS — R9431 Abnormal electrocardiogram [ECG] [EKG]: Secondary | ICD-10-CM | POA: Diagnosis not present

## 2013-02-18 DIAGNOSIS — Z452 Encounter for adjustment and management of vascular access device: Secondary | ICD-10-CM | POA: Diagnosis not present

## 2013-02-18 DIAGNOSIS — T8610 Unspecified complication of kidney transplant: Secondary | ICD-10-CM | POA: Diagnosis not present

## 2013-02-18 DIAGNOSIS — N189 Chronic kidney disease, unspecified: Secondary | ICD-10-CM | POA: Diagnosis not present

## 2013-02-18 DIAGNOSIS — D649 Anemia, unspecified: Secondary | ICD-10-CM | POA: Diagnosis present

## 2013-02-18 DIAGNOSIS — Q898 Other specified congenital malformations: Secondary | ICD-10-CM | POA: Diagnosis not present

## 2013-02-18 DIAGNOSIS — D72819 Decreased white blood cell count, unspecified: Secondary | ICD-10-CM | POA: Diagnosis not present

## 2013-02-18 DIAGNOSIS — Z87891 Personal history of nicotine dependence: Secondary | ICD-10-CM | POA: Diagnosis not present

## 2013-02-18 DIAGNOSIS — N179 Acute kidney failure, unspecified: Secondary | ICD-10-CM | POA: Diagnosis not present

## 2013-02-18 DIAGNOSIS — Z79899 Other long term (current) drug therapy: Secondary | ICD-10-CM | POA: Diagnosis not present

## 2013-02-18 DIAGNOSIS — I10 Essential (primary) hypertension: Secondary | ICD-10-CM | POA: Diagnosis not present

## 2013-02-18 DIAGNOSIS — Z940 Kidney transplant status: Secondary | ICD-10-CM | POA: Diagnosis not present

## 2013-03-03 DIAGNOSIS — Z09 Encounter for follow-up examination after completed treatment for conditions other than malignant neoplasm: Secondary | ICD-10-CM | POA: Diagnosis not present

## 2013-03-03 DIAGNOSIS — E559 Vitamin D deficiency, unspecified: Secondary | ICD-10-CM | POA: Diagnosis not present

## 2013-03-03 DIAGNOSIS — T86899 Unspecified complication of other transplanted tissue: Secondary | ICD-10-CM | POA: Diagnosis not present

## 2013-03-03 DIAGNOSIS — Z940 Kidney transplant status: Secondary | ICD-10-CM | POA: Diagnosis not present

## 2013-03-03 DIAGNOSIS — Z79899 Other long term (current) drug therapy: Secondary | ICD-10-CM | POA: Diagnosis not present

## 2013-03-04 DIAGNOSIS — D649 Anemia, unspecified: Secondary | ICD-10-CM | POA: Diagnosis not present

## 2013-03-04 DIAGNOSIS — I10 Essential (primary) hypertension: Secondary | ICD-10-CM | POA: Diagnosis not present

## 2013-03-04 DIAGNOSIS — R51 Headache: Secondary | ICD-10-CM | POA: Diagnosis not present

## 2013-03-04 DIAGNOSIS — E875 Hyperkalemia: Secondary | ICD-10-CM | POA: Diagnosis not present

## 2013-03-04 DIAGNOSIS — M545 Low back pain: Secondary | ICD-10-CM | POA: Diagnosis not present

## 2013-03-04 DIAGNOSIS — R0989 Other specified symptoms and signs involving the circulatory and respiratory systems: Secondary | ICD-10-CM | POA: Diagnosis not present

## 2013-03-04 DIAGNOSIS — F17200 Nicotine dependence, unspecified, uncomplicated: Secondary | ICD-10-CM | POA: Diagnosis not present

## 2013-03-04 DIAGNOSIS — R000 Tachycardia, unspecified: Secondary | ICD-10-CM | POA: Diagnosis not present

## 2013-03-04 DIAGNOSIS — R6883 Chills (without fever): Secondary | ICD-10-CM | POA: Diagnosis not present

## 2013-03-04 DIAGNOSIS — H9190 Unspecified hearing loss, unspecified ear: Secondary | ICD-10-CM | POA: Diagnosis not present

## 2013-03-04 DIAGNOSIS — N179 Acute kidney failure, unspecified: Secondary | ICD-10-CM | POA: Diagnosis not present

## 2013-03-04 DIAGNOSIS — T8610 Unspecified complication of kidney transplant: Secondary | ICD-10-CM | POA: Diagnosis not present

## 2013-03-04 DIAGNOSIS — R0602 Shortness of breath: Secondary | ICD-10-CM | POA: Diagnosis not present

## 2013-03-04 DIAGNOSIS — R718 Other abnormality of red blood cells: Secondary | ICD-10-CM | POA: Diagnosis not present

## 2013-03-04 DIAGNOSIS — Q898 Other specified congenital malformations: Secondary | ICD-10-CM | POA: Diagnosis not present

## 2013-03-04 DIAGNOSIS — R002 Palpitations: Secondary | ICD-10-CM | POA: Diagnosis not present

## 2013-03-04 DIAGNOSIS — Z79899 Other long term (current) drug therapy: Secondary | ICD-10-CM | POA: Diagnosis not present

## 2013-03-04 DIAGNOSIS — R0609 Other forms of dyspnea: Secondary | ICD-10-CM | POA: Diagnosis not present

## 2013-03-05 DIAGNOSIS — D649 Anemia, unspecified: Secondary | ICD-10-CM | POA: Diagnosis not present

## 2013-03-05 DIAGNOSIS — I10 Essential (primary) hypertension: Secondary | ICD-10-CM | POA: Diagnosis not present

## 2013-03-05 DIAGNOSIS — E875 Hyperkalemia: Secondary | ICD-10-CM | POA: Diagnosis not present

## 2013-03-05 DIAGNOSIS — N179 Acute kidney failure, unspecified: Secondary | ICD-10-CM | POA: Diagnosis not present

## 2013-03-08 DIAGNOSIS — R52 Pain, unspecified: Secondary | ICD-10-CM | POA: Diagnosis not present

## 2013-03-08 DIAGNOSIS — G4700 Insomnia, unspecified: Secondary | ICD-10-CM | POA: Diagnosis not present

## 2013-03-08 DIAGNOSIS — Z0000 Encounter for general adult medical examination without abnormal findings: Secondary | ICD-10-CM | POA: Diagnosis not present

## 2013-03-08 DIAGNOSIS — Q898 Other specified congenital malformations: Secondary | ICD-10-CM | POA: Diagnosis not present

## 2013-03-08 DIAGNOSIS — N189 Chronic kidney disease, unspecified: Secondary | ICD-10-CM | POA: Diagnosis not present

## 2013-03-08 DIAGNOSIS — Z940 Kidney transplant status: Secondary | ICD-10-CM | POA: Diagnosis not present

## 2013-03-08 DIAGNOSIS — I129 Hypertensive chronic kidney disease with stage 1 through stage 4 chronic kidney disease, or unspecified chronic kidney disease: Secondary | ICD-10-CM | POA: Diagnosis not present

## 2013-03-08 DIAGNOSIS — D649 Anemia, unspecified: Secondary | ICD-10-CM | POA: Diagnosis not present

## 2013-03-08 DIAGNOSIS — Z79899 Other long term (current) drug therapy: Secondary | ICD-10-CM | POA: Diagnosis not present

## 2013-03-16 DIAGNOSIS — D509 Iron deficiency anemia, unspecified: Secondary | ICD-10-CM | POA: Diagnosis not present

## 2013-03-16 DIAGNOSIS — Z940 Kidney transplant status: Secondary | ICD-10-CM | POA: Diagnosis not present

## 2013-03-17 DIAGNOSIS — Q898 Other specified congenital malformations: Secondary | ICD-10-CM | POA: Diagnosis not present

## 2013-03-17 DIAGNOSIS — H903 Sensorineural hearing loss, bilateral: Secondary | ICD-10-CM | POA: Diagnosis not present

## 2013-03-23 ENCOUNTER — Other Ambulatory Visit (HOSPITAL_COMMUNITY): Payer: Self-pay | Admitting: *Deleted

## 2013-03-23 DIAGNOSIS — Z48298 Encounter for aftercare following other organ transplant: Secondary | ICD-10-CM | POA: Diagnosis not present

## 2013-03-23 DIAGNOSIS — N170 Acute kidney failure with tubular necrosis: Secondary | ICD-10-CM | POA: Diagnosis not present

## 2013-03-23 DIAGNOSIS — F17200 Nicotine dependence, unspecified, uncomplicated: Secondary | ICD-10-CM | POA: Diagnosis not present

## 2013-03-23 DIAGNOSIS — Z9119 Patient's noncompliance with other medical treatment and regimen: Secondary | ICD-10-CM | POA: Diagnosis not present

## 2013-03-23 DIAGNOSIS — Z940 Kidney transplant status: Secondary | ICD-10-CM | POA: Diagnosis not present

## 2013-03-23 DIAGNOSIS — D631 Anemia in chronic kidney disease: Secondary | ICD-10-CM | POA: Diagnosis not present

## 2013-03-23 DIAGNOSIS — H9190 Unspecified hearing loss, unspecified ear: Secondary | ICD-10-CM | POA: Diagnosis not present

## 2013-03-23 DIAGNOSIS — Z79899 Other long term (current) drug therapy: Secondary | ICD-10-CM | POA: Diagnosis not present

## 2013-03-23 DIAGNOSIS — Q898 Other specified congenital malformations: Secondary | ICD-10-CM | POA: Diagnosis not present

## 2013-03-23 DIAGNOSIS — T8610 Unspecified complication of kidney transplant: Secondary | ICD-10-CM | POA: Diagnosis not present

## 2013-03-23 DIAGNOSIS — I10 Essential (primary) hypertension: Secondary | ICD-10-CM | POA: Diagnosis not present

## 2013-03-23 DIAGNOSIS — Z131 Encounter for screening for diabetes mellitus: Secondary | ICD-10-CM | POA: Diagnosis not present

## 2013-03-23 DIAGNOSIS — N179 Acute kidney failure, unspecified: Secondary | ICD-10-CM | POA: Diagnosis not present

## 2013-03-23 DIAGNOSIS — T8690 Unspecified complication of unspecified transplanted organ and tissue: Secondary | ICD-10-CM | POA: Diagnosis not present

## 2013-03-23 DIAGNOSIS — IMO0002 Reserved for concepts with insufficient information to code with codable children: Secondary | ICD-10-CM | POA: Diagnosis not present

## 2013-03-23 DIAGNOSIS — N186 End stage renal disease: Secondary | ICD-10-CM | POA: Diagnosis not present

## 2013-03-23 DIAGNOSIS — N189 Chronic kidney disease, unspecified: Secondary | ICD-10-CM | POA: Diagnosis not present

## 2013-03-23 DIAGNOSIS — K219 Gastro-esophageal reflux disease without esophagitis: Secondary | ICD-10-CM | POA: Diagnosis not present

## 2013-03-23 DIAGNOSIS — I129 Hypertensive chronic kidney disease with stage 1 through stage 4 chronic kidney disease, or unspecified chronic kidney disease: Secondary | ICD-10-CM | POA: Diagnosis not present

## 2013-03-24 ENCOUNTER — Encounter (HOSPITAL_COMMUNITY): Payer: Self-pay

## 2013-03-24 DIAGNOSIS — I10 Essential (primary) hypertension: Secondary | ICD-10-CM | POA: Diagnosis not present

## 2013-03-24 DIAGNOSIS — D649 Anemia, unspecified: Secondary | ICD-10-CM | POA: Diagnosis not present

## 2013-03-24 DIAGNOSIS — T8610 Unspecified complication of kidney transplant: Secondary | ICD-10-CM | POA: Diagnosis not present

## 2013-03-24 DIAGNOSIS — K219 Gastro-esophageal reflux disease without esophagitis: Secondary | ICD-10-CM | POA: Diagnosis not present

## 2013-03-24 DIAGNOSIS — Q898 Other specified congenital malformations: Secondary | ICD-10-CM | POA: Diagnosis not present

## 2013-03-24 DIAGNOSIS — F17200 Nicotine dependence, unspecified, uncomplicated: Secondary | ICD-10-CM | POA: Diagnosis not present

## 2013-03-24 DIAGNOSIS — I129 Hypertensive chronic kidney disease with stage 1 through stage 4 chronic kidney disease, or unspecified chronic kidney disease: Secondary | ICD-10-CM | POA: Diagnosis not present

## 2013-03-24 DIAGNOSIS — D631 Anemia in chronic kidney disease: Secondary | ICD-10-CM | POA: Diagnosis not present

## 2013-03-24 DIAGNOSIS — Q8989 Other specified congenital malformations: Secondary | ICD-10-CM | POA: Diagnosis not present

## 2013-03-29 DIAGNOSIS — T8610 Unspecified complication of kidney transplant: Secondary | ICD-10-CM | POA: Diagnosis not present

## 2013-03-29 DIAGNOSIS — Z940 Kidney transplant status: Secondary | ICD-10-CM | POA: Diagnosis not present

## 2013-03-29 DIAGNOSIS — I10 Essential (primary) hypertension: Secondary | ICD-10-CM | POA: Diagnosis not present

## 2013-03-29 DIAGNOSIS — N179 Acute kidney failure, unspecified: Secondary | ICD-10-CM | POA: Diagnosis not present

## 2013-03-29 DIAGNOSIS — Z79899 Other long term (current) drug therapy: Secondary | ICD-10-CM | POA: Diagnosis not present

## 2013-03-29 DIAGNOSIS — Z0000 Encounter for general adult medical examination without abnormal findings: Secondary | ICD-10-CM | POA: Diagnosis not present

## 2013-04-05 DIAGNOSIS — Z09 Encounter for follow-up examination after completed treatment for conditions other than malignant neoplasm: Secondary | ICD-10-CM | POA: Diagnosis not present

## 2013-04-05 DIAGNOSIS — E559 Vitamin D deficiency, unspecified: Secondary | ICD-10-CM | POA: Diagnosis not present

## 2013-04-05 DIAGNOSIS — Z79899 Other long term (current) drug therapy: Secondary | ICD-10-CM | POA: Diagnosis not present

## 2013-04-05 DIAGNOSIS — Z940 Kidney transplant status: Secondary | ICD-10-CM | POA: Diagnosis not present

## 2013-04-05 DIAGNOSIS — T86899 Unspecified complication of other transplanted tissue: Secondary | ICD-10-CM | POA: Diagnosis not present

## 2013-05-19 DIAGNOSIS — D631 Anemia in chronic kidney disease: Secondary | ICD-10-CM | POA: Diagnosis not present

## 2013-05-19 DIAGNOSIS — N184 Chronic kidney disease, stage 4 (severe): Secondary | ICD-10-CM | POA: Diagnosis not present

## 2013-05-19 DIAGNOSIS — Z940 Kidney transplant status: Secondary | ICD-10-CM | POA: Diagnosis not present

## 2013-05-19 DIAGNOSIS — N2581 Secondary hyperparathyroidism of renal origin: Secondary | ICD-10-CM | POA: Diagnosis not present

## 2013-05-19 DIAGNOSIS — I129 Hypertensive chronic kidney disease with stage 1 through stage 4 chronic kidney disease, or unspecified chronic kidney disease: Secondary | ICD-10-CM | POA: Diagnosis not present

## 2013-05-23 ENCOUNTER — Other Ambulatory Visit (HOSPITAL_COMMUNITY): Payer: Self-pay

## 2013-05-24 ENCOUNTER — Encounter (HOSPITAL_COMMUNITY)
Admission: RE | Admit: 2013-05-24 | Discharge: 2013-05-24 | Disposition: A | Discharge status: Home / Self Care | Payer: Medicare Other | Source: Ambulatory Visit | Attending: Nephrology | Admitting: Nephrology

## 2013-05-24 DIAGNOSIS — N184 Chronic kidney disease, stage 4 (severe): Secondary | ICD-10-CM | POA: Diagnosis not present

## 2013-05-24 DIAGNOSIS — D638 Anemia in other chronic diseases classified elsewhere: Secondary | ICD-10-CM | POA: Insufficient documentation

## 2013-05-24 MED ORDER — EPOETIN ALFA 40000 UNIT/ML IJ SOLN
INTRAMUSCULAR | Status: AC
  Filled 2013-05-24: qty 1

## 2013-05-24 MED ORDER — EPOETIN ALFA 40000 UNIT/ML IJ SOLN
40000.0000 [IU] | INTRAMUSCULAR | Status: DC
  Administered 2013-05-24: 40000 [IU] via SUBCUTANEOUS

## 2013-06-07 ENCOUNTER — Encounter (HOSPITAL_COMMUNITY)
Admission: RE | Admit: 2013-06-07 | Discharge: 2013-06-07 | Disposition: A | Discharge status: Home / Self Care | Payer: Medicare Other | Source: Ambulatory Visit | Attending: Nephrology | Admitting: Nephrology

## 2013-06-07 DIAGNOSIS — D638 Anemia in other chronic diseases classified elsewhere: Secondary | ICD-10-CM | POA: Diagnosis not present

## 2013-06-07 DIAGNOSIS — N184 Chronic kidney disease, stage 4 (severe): Secondary | ICD-10-CM | POA: Diagnosis not present

## 2013-06-07 LAB — POCT HEMOGLOBIN-HEMACUE: Hemoglobin: 8 g/dL — ABNORMAL LOW (ref 13.0–17.0)

## 2013-06-07 MED ORDER — EPOETIN ALFA 40000 UNIT/ML IJ SOLN
40000.0000 [IU] | INTRAMUSCULAR | Status: DC
  Administered 2013-06-07: 40000 [IU] via SUBCUTANEOUS

## 2013-06-07 MED ORDER — EPOETIN ALFA 40000 UNIT/ML IJ SOLN
INTRAMUSCULAR | Status: AC
  Filled 2013-06-07: qty 1

## 2013-06-21 ENCOUNTER — Encounter (HOSPITAL_COMMUNITY): Payer: Self-pay

## 2013-07-08 ENCOUNTER — Encounter (HOSPITAL_COMMUNITY): Payer: Self-pay

## 2013-08-24 DIAGNOSIS — I129 Hypertensive chronic kidney disease with stage 1 through stage 4 chronic kidney disease, or unspecified chronic kidney disease: Secondary | ICD-10-CM | POA: Diagnosis not present

## 2013-08-24 DIAGNOSIS — D631 Anemia in chronic kidney disease: Secondary | ICD-10-CM | POA: Diagnosis not present

## 2013-08-24 DIAGNOSIS — Z940 Kidney transplant status: Secondary | ICD-10-CM | POA: Diagnosis not present

## 2013-08-24 DIAGNOSIS — N184 Chronic kidney disease, stage 4 (severe): Secondary | ICD-10-CM | POA: Diagnosis not present

## 2013-08-24 DIAGNOSIS — N2581 Secondary hyperparathyroidism of renal origin: Secondary | ICD-10-CM | POA: Diagnosis not present

## 2013-09-07 DIAGNOSIS — I129 Hypertensive chronic kidney disease with stage 1 through stage 4 chronic kidney disease, or unspecified chronic kidney disease: Secondary | ICD-10-CM | POA: Diagnosis not present

## 2013-09-15 ENCOUNTER — Other Ambulatory Visit (HOSPITAL_COMMUNITY): Payer: Self-pay | Admitting: *Deleted

## 2013-09-16 ENCOUNTER — Encounter (HOSPITAL_COMMUNITY)
Admission: RE | Admit: 2013-09-16 | Discharge: 2013-09-16 | Disposition: A | Discharge status: Home / Self Care | Payer: Medicare Other | Source: Ambulatory Visit | Attending: Nephrology | Admitting: Nephrology

## 2013-09-16 DIAGNOSIS — N184 Chronic kidney disease, stage 4 (severe): Secondary | ICD-10-CM | POA: Insufficient documentation

## 2013-09-16 DIAGNOSIS — D638 Anemia in other chronic diseases classified elsewhere: Secondary | ICD-10-CM | POA: Diagnosis not present

## 2013-09-16 DIAGNOSIS — Z5181 Encounter for therapeutic drug level monitoring: Secondary | ICD-10-CM | POA: Diagnosis not present

## 2013-09-16 LAB — FERRITIN: FERRITIN: 2484 ng/mL — AB (ref 22–322)

## 2013-09-16 LAB — IRON AND TIBC
IRON: 85 ug/dL (ref 42–135)
Saturation Ratios: 33 % (ref 20–55)
TIBC: 259 ug/dL (ref 215–435)
UIBC: 174 ug/dL (ref 125–400)

## 2013-09-16 LAB — POCT HEMOGLOBIN-HEMACUE: HEMOGLOBIN: 7.5 g/dL — AB (ref 13.0–17.0)

## 2013-09-16 MED ORDER — EPOETIN ALFA 40000 UNIT/ML IJ SOLN
40000.0000 [IU] | INTRAMUSCULAR | Status: DC
  Administered 2013-09-16: 40000 [IU] via SUBCUTANEOUS

## 2013-09-16 MED ORDER — EPOETIN ALFA 40000 UNIT/ML IJ SOLN
INTRAMUSCULAR | Status: AC
  Filled 2013-09-16: qty 1

## 2013-09-16 NOTE — Progress Notes (Signed)
Dr Lorrene Reid advised of hemoglobin.  No further orders recieved

## 2013-09-30 ENCOUNTER — Encounter (HOSPITAL_COMMUNITY): Payer: Self-pay

## 2013-11-23 ENCOUNTER — Encounter (HOSPITAL_COMMUNITY): Payer: Self-pay | Admitting: Emergency Medicine

## 2013-11-23 DIAGNOSIS — E872 Acidosis, unspecified: Secondary | ICD-10-CM | POA: Diagnosis not present

## 2013-11-23 DIAGNOSIS — Y830 Surgical operation with transplant of whole organ as the cause of abnormal reaction of the patient, or of later complication, without mention of misadventure at the time of the procedure: Secondary | ICD-10-CM | POA: Insufficient documentation

## 2013-11-23 DIAGNOSIS — Z792 Long term (current) use of antibiotics: Secondary | ICD-10-CM | POA: Insufficient documentation

## 2013-11-23 DIAGNOSIS — R112 Nausea with vomiting, unspecified: Secondary | ICD-10-CM | POA: Diagnosis present

## 2013-11-23 DIAGNOSIS — R498 Other voice and resonance disorders: Secondary | ICD-10-CM | POA: Insufficient documentation

## 2013-11-23 DIAGNOSIS — Z87798 Personal history of other (corrected) congenital malformations: Secondary | ICD-10-CM | POA: Diagnosis not present

## 2013-11-23 DIAGNOSIS — T8610 Unspecified complication of kidney transplant: Secondary | ICD-10-CM | POA: Diagnosis not present

## 2013-11-23 DIAGNOSIS — D649 Anemia, unspecified: Secondary | ICD-10-CM | POA: Diagnosis not present

## 2013-11-23 DIAGNOSIS — N19 Unspecified kidney failure: Secondary | ICD-10-CM | POA: Insufficient documentation

## 2013-11-23 DIAGNOSIS — F17200 Nicotine dependence, unspecified, uncomplicated: Secondary | ICD-10-CM | POA: Insufficient documentation

## 2013-11-23 DIAGNOSIS — Z79899 Other long term (current) drug therapy: Secondary | ICD-10-CM | POA: Diagnosis not present

## 2013-11-23 DIAGNOSIS — IMO0002 Reserved for concepts with insufficient information to code with codable children: Secondary | ICD-10-CM | POA: Diagnosis not present

## 2013-11-23 LAB — CBC WITH DIFFERENTIAL/PLATELET
Basophils Absolute: 0 10*3/uL (ref 0.0–0.1)
Basophils Relative: 0 % (ref 0–1)
Eosinophils Absolute: 0.2 10*3/uL (ref 0.0–0.7)
Eosinophils Relative: 2 % (ref 0–5)
HEMATOCRIT: 21.3 % — AB (ref 39.0–52.0)
HEMOGLOBIN: 7.1 g/dL — AB (ref 13.0–17.0)
LYMPHS ABS: 0.6 10*3/uL — AB (ref 0.7–4.0)
Lymphocytes Relative: 7 % — ABNORMAL LOW (ref 12–46)
MCH: 26.2 pg (ref 26.0–34.0)
MCHC: 33.3 g/dL (ref 30.0–36.0)
MCV: 78.6 fL (ref 78.0–100.0)
MONO ABS: 0.3 10*3/uL (ref 0.1–1.0)
MONOS PCT: 4 % (ref 3–12)
NEUTROS ABS: 6.9 10*3/uL (ref 1.7–7.7)
NEUTROS PCT: 87 % — AB (ref 43–77)
Platelets: 236 10*3/uL (ref 150–400)
RBC: 2.71 MIL/uL — AB (ref 4.22–5.81)
RDW: 14.8 % (ref 11.5–15.5)
WBC: 8 10*3/uL (ref 4.0–10.5)

## 2013-11-23 LAB — COMPREHENSIVE METABOLIC PANEL
ALT: 22 U/L (ref 0–53)
AST: 22 U/L (ref 0–37)
Albumin: 3.8 g/dL (ref 3.5–5.2)
Alkaline Phosphatase: 111 U/L (ref 39–117)
BILIRUBIN TOTAL: 0.3 mg/dL (ref 0.3–1.2)
BUN: 134 mg/dL — ABNORMAL HIGH (ref 6–23)
CHLORIDE: 104 meq/L (ref 96–112)
CO2: 8 meq/L — AB (ref 19–32)
CREATININE: 31.85 mg/dL — AB (ref 0.50–1.35)
Calcium: 9 mg/dL (ref 8.4–10.5)
GFR, EST AFRICAN AMERICAN: 2 mL/min — AB (ref >90)
GFR, EST NON AFRICAN AMERICAN: 2 mL/min — AB (ref >90)
GLUCOSE: 127 mg/dL — AB (ref 70–99)
Potassium: 5.6 mEq/L — ABNORMAL HIGH (ref 3.7–5.3)
Sodium: 140 mEq/L (ref 137–147)
Total Protein: 8.3 g/dL (ref 6.0–8.3)

## 2013-11-23 MED ORDER — ONDANSETRON 4 MG PO TBDP
8.0000 mg | ORAL_TABLET | Freq: Once | ORAL | Status: AC
  Administered 2013-11-23: 8 mg via ORAL
  Filled 2013-11-23: qty 2

## 2013-11-23 NOTE — ED Notes (Signed)
Patient arrives with complaint of headache, nausea, and vomiting starting today. Patient had a kidney transplant in 2009.

## 2013-11-23 NOTE — ED Notes (Signed)
Patient admits that he has been without his transplant drugs for about a month.

## 2013-11-24 ENCOUNTER — Emergency Department (HOSPITAL_COMMUNITY)
Admission: EM | Admit: 2013-11-24 | Discharge: 2013-11-24 | Disposition: A | Discharge status: Other Acute Inpatient Hospital | Payer: Medicare Other | Attending: Emergency Medicine | Admitting: Emergency Medicine

## 2013-11-24 DIAGNOSIS — T8690 Unspecified complication of unspecified transplanted organ and tissue: Secondary | ICD-10-CM | POA: Diagnosis not present

## 2013-11-24 DIAGNOSIS — E872 Acidosis, unspecified: Secondary | ICD-10-CM | POA: Diagnosis present

## 2013-11-24 DIAGNOSIS — Z79899 Other long term (current) drug therapy: Secondary | ICD-10-CM | POA: Diagnosis not present

## 2013-11-24 DIAGNOSIS — Z940 Kidney transplant status: Secondary | ICD-10-CM | POA: Diagnosis not present

## 2013-11-24 DIAGNOSIS — R0989 Other specified symptoms and signs involving the circulatory and respiratory systems: Secondary | ICD-10-CM | POA: Diagnosis not present

## 2013-11-24 DIAGNOSIS — IMO0002 Reserved for concepts with insufficient information to code with codable children: Secondary | ICD-10-CM | POA: Diagnosis not present

## 2013-11-24 DIAGNOSIS — N186 End stage renal disease: Secondary | ICD-10-CM | POA: Diagnosis not present

## 2013-11-24 DIAGNOSIS — T8610 Unspecified complication of kidney transplant: Secondary | ICD-10-CM | POA: Diagnosis present

## 2013-11-24 DIAGNOSIS — Z9119 Patient's noncompliance with other medical treatment and regimen: Secondary | ICD-10-CM | POA: Diagnosis not present

## 2013-11-24 DIAGNOSIS — N19 Unspecified kidney failure: Secondary | ICD-10-CM | POA: Diagnosis not present

## 2013-11-24 DIAGNOSIS — R498 Other voice and resonance disorders: Secondary | ICD-10-CM

## 2013-11-24 DIAGNOSIS — Z87798 Personal history of other (corrected) congenital malformations: Secondary | ICD-10-CM | POA: Diagnosis not present

## 2013-11-24 DIAGNOSIS — N179 Acute kidney failure, unspecified: Secondary | ICD-10-CM | POA: Diagnosis not present

## 2013-11-24 DIAGNOSIS — F17200 Nicotine dependence, unspecified, uncomplicated: Secondary | ICD-10-CM | POA: Diagnosis not present

## 2013-11-24 DIAGNOSIS — E875 Hyperkalemia: Secondary | ICD-10-CM | POA: Diagnosis not present

## 2013-11-24 DIAGNOSIS — D631 Anemia in chronic kidney disease: Secondary | ICD-10-CM | POA: Diagnosis present

## 2013-11-24 DIAGNOSIS — Z792 Long term (current) use of antibiotics: Secondary | ICD-10-CM | POA: Diagnosis not present

## 2013-11-24 DIAGNOSIS — Q898 Other specified congenital malformations: Secondary | ICD-10-CM | POA: Diagnosis not present

## 2013-11-24 DIAGNOSIS — D649 Anemia, unspecified: Secondary | ICD-10-CM

## 2013-11-24 DIAGNOSIS — K219 Gastro-esophageal reflux disease without esophagitis: Secondary | ICD-10-CM | POA: Diagnosis present

## 2013-11-24 DIAGNOSIS — T8612 Kidney transplant failure: Secondary | ICD-10-CM

## 2013-11-24 DIAGNOSIS — N189 Chronic kidney disease, unspecified: Secondary | ICD-10-CM | POA: Diagnosis not present

## 2013-11-24 DIAGNOSIS — H9190 Unspecified hearing loss, unspecified ear: Secondary | ICD-10-CM | POA: Diagnosis present

## 2013-11-24 DIAGNOSIS — Y830 Surgical operation with transplant of whole organ as the cause of abnormal reaction of the patient, or of later complication, without mention of misadventure at the time of the procedure: Secondary | ICD-10-CM | POA: Diagnosis not present

## 2013-11-24 DIAGNOSIS — Z87441 Personal history of nephrotic syndrome: Secondary | ICD-10-CM | POA: Diagnosis not present

## 2013-11-24 DIAGNOSIS — Z91199 Patient's noncompliance with other medical treatment and regimen due to unspecified reason: Secondary | ICD-10-CM | POA: Diagnosis not present

## 2013-11-24 DIAGNOSIS — I10 Essential (primary) hypertension: Secondary | ICD-10-CM | POA: Diagnosis not present

## 2013-11-24 DIAGNOSIS — R0609 Other forms of dyspnea: Secondary | ICD-10-CM | POA: Diagnosis not present

## 2013-11-24 DIAGNOSIS — M7989 Other specified soft tissue disorders: Secondary | ICD-10-CM | POA: Diagnosis not present

## 2013-11-24 DIAGNOSIS — E860 Dehydration: Secondary | ICD-10-CM | POA: Diagnosis not present

## 2013-11-24 DIAGNOSIS — R1110 Vomiting, unspecified: Secondary | ICD-10-CM | POA: Diagnosis not present

## 2013-11-24 DIAGNOSIS — J90 Pleural effusion, not elsewhere classified: Secondary | ICD-10-CM | POA: Diagnosis not present

## 2013-11-24 DIAGNOSIS — R112 Nausea with vomiting, unspecified: Secondary | ICD-10-CM | POA: Diagnosis present

## 2013-11-24 DIAGNOSIS — Z87891 Personal history of nicotine dependence: Secondary | ICD-10-CM | POA: Diagnosis not present

## 2013-11-24 DIAGNOSIS — I120 Hypertensive chronic kidney disease with stage 5 chronic kidney disease or end stage renal disease: Secondary | ICD-10-CM | POA: Diagnosis not present

## 2013-11-24 MED ORDER — ACETAMINOPHEN 325 MG PO TABS
650.0000 mg | ORAL_TABLET | Freq: Once | ORAL | Status: AC
Start: 2013-11-24 — End: 2013-11-24
  Administered 2013-11-24: 650 mg via ORAL
  Filled 2013-11-24: qty 2

## 2013-11-24 NOTE — ED Notes (Signed)
Patient states still no able to void at this time.  Will continue to monitor.

## 2013-11-24 NOTE — ED Notes (Signed)
TRANSPORTED TO [facility] [place] [facility] BY CARE LINK IN STABLE CONDITION , PERSONAL BELONGINGS BAGGED GIVEN TO FAMILY MEMBER AT TRANSFER,.

## 2013-11-24 NOTE — ED Notes (Signed)
Advised patient of need for urine spec.

## 2013-11-24 NOTE — ED Notes (Signed)
Patient resting, states unable to void, will continue to monitor.

## 2013-11-24 NOTE — ED Provider Notes (Signed)
CSN: [id number]     Arrival date & time 11/23/13  2141 History   First MD Initiated Contact with Patient 11/24/13 0302     Chief Complaint  Patient presents with  . Emesis  . Headache  . Abdominal Pain     (Consider location/radiation/quality/duration/timing/severity/associated sxs/prior Treatment) Patient is a 28 y.o. male presenting with vomiting, headaches, and abdominal pain. The history is provided by the patient.  Emesis Associated symptoms: abdominal pain and headaches   Headache Associated symptoms: abdominal pain and vomiting   Abdominal Pain Associated symptoms: vomiting   He had onset and history of unit of crampy left lower quadrant pain and nausea and vomiting. He he denies fever or chills. He denies diarrhea. He is also complaining of mild headache. He rates pain at 5/10. 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  Past Medical History  Diagnosis Date  . Alport syndrome   . Alport syndrome    Past Surgical History  Procedure Laterality Date  . Kidney transplant    . Kidney transplant    . Nephrectomy transplanted organ     Family History  Problem Relation Age of Onset  . Cancer Mother    History  Substance Use Topics  . Smoking status: Current Every Day Smoker    Types: Cigarettes  . Smokeless tobacco: Not on file  . Alcohol Use: No    Review of Systems  Gastrointestinal: Positive for vomiting and abdominal pain.  Neurological: Positive for headaches.  All other systems reviewed and are negative.     Allergies  Review of patient's allergies indicates no known allergies.  Home Medications   Prior to Admission medications   Medication Sig Start Date End Date Taking? Authorizing Provider  amLODipine (NORVASC) 10 MG tablet Take 10 mg by mouth daily.    Yes Historical Provider, MD  calcitRIOL (ROCALTROL) 0.25 MCG capsule Take 0.75 mcg by mouth daily.    Yes Historical Provider, MD  magnesium oxide (MAG-OX) 400 MG tablet Take 400 mg by mouth daily.   Yes Historical Provider, MD  mycophenolate (MYFORTIC) 180 MG EC tablet Take 540 mg by mouth 2 (two) times daily.    Yes Historical Provider, MD  omeprazole (PRILOSEC) 20 MG capsule Take 20 mg by mouth daily.     Yes Historical Provider, MD  predniSONE (DELTASONE) 10 MG tablet Take 10 mg by mouth daily.     Yes Historical Provider, MD  sodium bicarbonate 325 MG tablet Take 325 mg by mouth 3 (three) times daily.   Yes Historical Provider, MD  sulfamethoxazole-trimethoprim (BACTRIM,SEPTRA) 400-80 MG per tablet Take 1 tablet by mouth 3 (three) times a week. Take on Tuesday, Thursday and Saturday.   Yes Historical Provider, MD  tacrolimus (PROGRAF) 5 MG capsule Take 10 mg by mouth daily.   Yes Historical Provider, MD  valGANciclovir (VALCYTE) 450 MG tablet Take 450 mg by mouth 2 (two) times a week. Take on Tuesday and Saturday   Yes Historical Provider, MD   BP 151/58  Pulse 67  Temp(Src) 98 F (36.7 C) (Oral)  Resp 18  Ht $R'5\' 9"'KM$  (1.753 m)  Wt 125 lb (56.7 kg)  BMI 18.45 kg/m2  SpO2 100% Physical Exam  Nursing note and vitals reviewed.  28 year old male, resting comfortably and in no acute distress. Vital signs are significant for hypertension with blood pressure 151/58. Oxygen saturation is 100%, which  is normal. Head is normocephalic and atraumatic. PERRLA, EOMI. Oropharynx is clear. Neck is nontender and supple without adenopathy or JVD. Back is nontender and there is no CVA tenderness. Lungs are clear without rales, wheezes, or rhonchi. Chest is nontender. Heart has regular rate and rhythm without murmur. Abdomen is soft, flat, with mild tenderness in the left lower cautery. Renal transplant is present in the right lower quadrant and is nontender. There is no hepatosplenomegaly and peristalsis is hypoactive. Extremities have no cyanosis or edema, full range of motion is present. AV fistula is  present in the left forearm with strong thrill. Skin is warm and dry without rash. Neurologic: Mental status is normal, cranial nerves are intact, there are no motor or sensory deficits.  ED Course  Procedures (including critical care time) Labs Review Results for orders placed during the hospital encounter of 11/24/13  CBC WITH DIFFERENTIAL      Result Value Ref Range   WBC 8.0  4.0 - 10.5 K/uL   RBC 2.71 (*) 4.22 - 5.81 MIL/uL   Hemoglobin 7.1 (*) 13.0 - 17.0 g/dL   HCT 14.2 (*) 76.7 - 01.1 %   MCV 78.6  78.0 - 100.0 fL   MCH 26.2  26.0 - 34.0 pg   MCHC 33.3  30.0 - 36.0 g/dL   RDW 00.3  49.6 - 11.6 %   Platelets 236  150 - 400 K/uL   Neutrophils Relative % 87 (*) 43 - 77 %   Neutro Abs 6.9  1.7 - 7.7 K/uL   Lymphocytes Relative 7 (*) 12 - 46 %   Lymphs Abs 0.6 (*) 0.7 - 4.0 K/uL   Monocytes Relative 4  3 - 12 %   Monocytes Absolute 0.3  0.1 - 1.0 K/uL   Eosinophils Relative 2  0 - 5 %   Eosinophils Absolute 0.2  0.0 - 0.7 K/uL   Basophils Relative 0  0 - 1 %   Basophils Absolute 0.0  0.0 - 0.1 K/uL  COMPREHENSIVE METABOLIC PANEL      Result Value Ref Range   Sodium 140  137 - 147 mEq/L   Potassium 5.6 (*) 3.7 - 5.3 mEq/L   Chloride 104  96 - 112 mEq/L   CO2 8 (*) 19 - 32 mEq/L   Glucose, Bld 127 (*) 70 - 99 mg/dL   BUN 435 (*) 6 - 23 mg/dL   Creatinine, Ser 39.12 (*) 0.50 - 1.35 mg/dL   Calcium 9.0  8.4 - 25.8 mg/dL   Total Protein 8.3  6.0 - 8.3 g/dL   Albumin 3.8  3.5 - 5.2 g/dL   AST 22  0 - 37 U/L   ALT 22  0 - 53 U/L   Alkaline Phosphatase 111  39 - 117 U/L   Total Bilirubin 0.3  0.3 - 1.2 mg/dL   GFR calc non Af Amer 2 (*) >90 mL/min   GFR calc Af Amer 2 (*) >90 mL/min    CRITICAL CARE Performed by: TMMIT,VIFXG Total critical care time: 40 minutes Critical care time was exclusive of separately billable procedures and treating other patients. Critical care was necessary to treat or prevent imminent or life-threatening deterioration. Critical care was time  spent personally by me on the following activities: development of treatment plan with patient and/or surrogate as well as nursing, discussions with consultants, evaluation of patient's response to treatment, examination of patient, obtaining history from patient or surrogate, ordering and performing treatments and interventions, ordering and review  of laboratory studies, ordering and review of radiographic studies, pulse oximetry and re-evaluation of patient's condition.  MDM   Final diagnoses:  Uremia  Kidney transplant failure  Anemia  Metabolic acidosis  Nasal voice    Nausea and vomiting secondary to acute transplant failure. BUN and creatinine her massively elevated and CO2 is low with elevated anion gap consistent with acute uremia. Hemoglobin is a little changed from baseline.  Case is discussed with Dr. Justin Mend of [facility] who feels that the patient needs to return to the transplant team at [facility]. Case is discussed with Dr. Cletus Gash at [facility] agrees to accept the patient in transfer.  Delora Fuel, MD 08/14/29 [id number]

## 2013-12-03 DIAGNOSIS — N039 Chronic nephritic syndrome with unspecified morphologic changes: Secondary | ICD-10-CM | POA: Diagnosis not present

## 2013-12-03 DIAGNOSIS — D631 Anemia in chronic kidney disease: Secondary | ICD-10-CM | POA: Diagnosis not present

## 2013-12-03 DIAGNOSIS — N186 End stage renal disease: Secondary | ICD-10-CM | POA: Diagnosis not present

## 2013-12-03 DIAGNOSIS — N2581 Secondary hyperparathyroidism of renal origin: Secondary | ICD-10-CM | POA: Diagnosis not present

## 2013-12-04 ENCOUNTER — Encounter (HOSPITAL_COMMUNITY): Payer: Self-pay | Admitting: Emergency Medicine

## 2013-12-04 ENCOUNTER — Emergency Department (HOSPITAL_COMMUNITY)
Admission: EM | Admit: 2013-12-04 | Discharge: 2013-12-04 | Disposition: A | Discharge status: Home / Self Care | Payer: Medicare Other | Attending: Emergency Medicine | Admitting: Emergency Medicine

## 2013-12-04 DIAGNOSIS — IMO0002 Reserved for concepts with insufficient information to code with codable children: Secondary | ICD-10-CM | POA: Insufficient documentation

## 2013-12-04 DIAGNOSIS — Z940 Kidney transplant status: Secondary | ICD-10-CM | POA: Diagnosis not present

## 2013-12-04 DIAGNOSIS — R319 Hematuria, unspecified: Secondary | ICD-10-CM | POA: Diagnosis not present

## 2013-12-04 DIAGNOSIS — Q898 Other specified congenital malformations: Secondary | ICD-10-CM | POA: Diagnosis not present

## 2013-12-04 DIAGNOSIS — Z79899 Other long term (current) drug therapy: Secondary | ICD-10-CM | POA: Insufficient documentation

## 2013-12-04 DIAGNOSIS — Z87891 Personal history of nicotine dependence: Secondary | ICD-10-CM | POA: Insufficient documentation

## 2013-12-04 LAB — BASIC METABOLIC PANEL
BUN: 28 mg/dL — ABNORMAL HIGH (ref 6–23)
CALCIUM: 9.3 mg/dL (ref 8.4–10.5)
CO2: 30 mEq/L (ref 19–32)
Chloride: 99 mEq/L (ref 96–112)
Creatinine, Ser: 9.12 mg/dL — ABNORMAL HIGH (ref 0.50–1.35)
GFR, EST AFRICAN AMERICAN: 8 mL/min — AB (ref >90)
GFR, EST NON AFRICAN AMERICAN: 7 mL/min — AB (ref >90)
Glucose, Bld: 99 mg/dL (ref 70–99)
Potassium: 4.6 mEq/L (ref 3.7–5.3)
SODIUM: 141 meq/L (ref 137–147)

## 2013-12-04 LAB — CBC WITH DIFFERENTIAL/PLATELET
BASOS ABS: 0 10*3/uL (ref 0.0–0.1)
Basophils Relative: 0 % (ref 0–1)
EOS PCT: 8 % — AB (ref 0–5)
Eosinophils Absolute: 0.4 10*3/uL (ref 0.0–0.7)
HCT: 22.2 % — ABNORMAL LOW (ref 39.0–52.0)
Hemoglobin: 7 g/dL — ABNORMAL LOW (ref 13.0–17.0)
Lymphocytes Relative: 8 % — ABNORMAL LOW (ref 12–46)
Lymphs Abs: 0.5 10*3/uL — ABNORMAL LOW (ref 0.7–4.0)
MCH: 25.7 pg — ABNORMAL LOW (ref 26.0–34.0)
MCHC: 31.5 g/dL (ref 30.0–36.0)
MCV: 81.6 fL (ref 78.0–100.0)
Monocytes Absolute: 0.7 10*3/uL (ref 0.1–1.0)
Monocytes Relative: 12 % (ref 3–12)
Neutro Abs: 4.1 10*3/uL (ref 1.7–7.7)
Neutrophils Relative %: 72 % (ref 43–77)
PLATELETS: 219 10*3/uL (ref 150–400)
RBC: 2.72 MIL/uL — ABNORMAL LOW (ref 4.22–5.81)
RDW: 14.7 % (ref 11.5–15.5)
WBC: 5.8 10*3/uL (ref 4.0–10.5)

## 2013-12-04 LAB — URINALYSIS, ROUTINE W REFLEX MICROSCOPIC
Glucose, UA: 100 mg/dL — AB
Hgb urine dipstick: NEGATIVE
Ketones, ur: 40 mg/dL — AB
Nitrite: POSITIVE — AB
Specific Gravity, Urine: 1.026 (ref 1.005–1.030)
pH: 6.5 (ref 5.0–8.0)

## 2013-12-04 LAB — URINE MICROSCOPIC-ADD ON

## 2013-12-04 LAB — APTT: APTT: 34 s (ref 24–37)

## 2013-12-04 MED ORDER — CEFTRIAXONE SODIUM 1 G IJ SOLR
1.0000 g | Freq: Once | INTRAMUSCULAR | Status: AC
  Administered 2013-12-04: 1 g via INTRAVENOUS
  Filled 2013-12-04: qty 10

## 2013-12-04 MED ORDER — CEPHALEXIN 500 MG PO CAPS: ORAL_CAPSULE | ORAL | Status: DC

## 2013-12-04 NOTE — Unmapped (Signed)
updated

## 2013-12-04 NOTE — ED Notes (Signed)
Pt reports receiving heparin injections for blood clots. Sts had an episode of swelling in R hand x 1 month ago; PCP concerned for clots and started heparin as a prophylaxis. Patient reports ultrasound study was negative for clots.

## 2013-12-04 NOTE — ED Notes (Signed)
[name], [place] [place] at bedside

## 2013-12-04 NOTE — ED Provider Notes (Signed)
CSN: [id number]     Arrival date & time 12/04/13  0847 History   First MD Initiated Contact with Patient 12/04/13 [phone]     Chief Complaint  Patient presents with  . Hematuria     (Consider location/radiation/quality/duration/timing/severity/associated sxs/prior Treatment) HPI Comments: Patient is a 28 year old male with history of all port syndrome who presents today with hematuria. He reports that he had a kidney transplant in 2009. He had gradually worsening crampy abdominal pain, nausea, vomiting. He was found to have a creatinine of 31.85. He was admitted at [facility]. He was diagnosed with kidney transplant rejection. He was stopped on his immunosuppressive medication. He began dialysis and dialyzes Tuesday, Thursday, Saturday. He last dialyzed yesterday. He noticed blood clots in his urine today. He denies any pain, burning, frequency, urgency. He is not having any nausea or vomiting today. No fevers, chills. His nephrologist is Dr. Jimmy Footman.   The history is provided by the patient. No language interpreter was used.    Past Medical History  Diagnosis Date  . Alport syndrome   . Alport syndrome    Past Surgical History  Procedure Laterality Date  . Kidney transplant    . Kidney transplant    . Nephrectomy transplanted organ     Family History  Problem Relation Age of Onset  . Cancer Mother    History  Substance Use Topics  . Smoking status: Former Smoker    Types: Cigarettes    Quit date: 12/04/2012  . Smokeless tobacco: Not on file  . Alcohol Use: No    Review of Systems  Constitutional: Negative for fever and chills.  Respiratory: Negative for shortness of breath.   Cardiovascular: Negative for chest pain.  Gastrointestinal: Negative for nausea, vomiting, abdominal pain and diarrhea.  Genitourinary: Positive for hematuria. Negative for dysuria, frequency, flank pain, difficulty urinating, penile pain and testicular pain.  All other systems reviewed and are  negative.     Allergies  Review of patient's allergies indicates no known allergies.  Home Medications   Prior to Admission medications   Medication Sig Start Date End Date Taking? Authorizing Provider  acetaminophen (TYLENOL) 325 MG tablet Take 325 mg by mouth daily as needed.   Yes Historical Provider, MD  acetaminophen (TYLENOL) 500 MG tablet Take 1,000 mg by mouth every 6 (six) hours as needed.   Yes Historical Provider, MD  amLODipine (NORVASC) 10 MG tablet Take 10 mg by mouth daily.    Yes Historical Provider, MD  calcitRIOL (ROCALTROL) 0.25 MCG capsule Take 0.5 mcg by mouth daily.    Yes Historical Provider, MD  calcium carbonate (TUMS) 500 MG chewable tablet Chew 500 mg by mouth daily as needed. 01/25/13 01/25/14 Yes Historical Provider, MD  cloNIDine (CATAPRES) 0.1 MG tablet Take 0.1 mg by mouth daily. 12/01/13  Yes Historical Provider, MD  diltiazem (CARDIZEM CD) 120 MG 24 hr capsule Take 120 mg by mouth daily. 10/04/13 10/04/14 Yes Historical Provider, MD  epoetin alfa (EPOGEN,PROCRIT) 2000 UNIT/ML injection Inject 4,000 Units into the skin 3 (three) times a week. 12/02/13  Yes Historical Provider, MD  predniSONE (DELTASONE) 2.5 MG tablet Take 5 mg by mouth daily. 12/02/13 12/09/13 Yes Historical Provider, MD  sevelamer carbonate (RENVELA) 800 MG tablet Take 800 mg by mouth 3 (three) times daily. 12/01/13 12/01/14 Yes Historical Provider, MD  omeprazole (PRILOSEC) 20 MG capsule Take 20 mg by mouth daily as needed (for acid reflux).     Historical Provider, MD   BP 120/60  Pulse 75  Temp(Src) 99.7 F (37.6 C) (Oral)  Resp 18  SpO2 99% Physical Exam  Nursing note and vitals reviewed. Constitutional: He is oriented to person, place, and time. He appears well-developed and well-nourished. He does not appear ill. No distress.  Patient appears well and comfortable.   HENT:  Head: Normocephalic and atraumatic.  Right Ear: External ear normal.  Left Ear: External ear normal.  Nose:  Nose normal.  Eyes: Conjunctivae are normal.  Neck: Normal range of motion. No tracheal deviation present.  Cardiovascular: Normal rate, regular rhythm and normal heart sounds.   Pulmonary/Chest: Effort normal and breath sounds normal. No stridor.  Abdominal: Soft. He exhibits no distension. There is no tenderness.  Musculoskeletal: Normal range of motion.  Neurological: He is alert and oriented to person, place, and time.  Skin: Skin is warm and dry. He is not diaphoretic.  Psychiatric: He has a normal mood and affect. His behavior is normal.    ED Course  Procedures (including critical care time) Labs Review Labs Reviewed  URINALYSIS, ROUTINE W REFLEX MICROSCOPIC - Abnormal; Notable for the following:    Color, Urine RED (*)    APPearance TURBID (*)    Glucose, UA 100 (*)    Bilirubin Urine LARGE (*)    Ketones, ur 40 (*)    Protein, ur >300 (*)    Urobilinogen, UA >8.0 (*)    Nitrite POSITIVE (*)    Leukocytes, UA LARGE (*)    All other components within normal limits  CBC WITH DIFFERENTIAL - Abnormal; Notable for the following:    RBC 2.72 (*)    Hemoglobin 7.0 (*)    HCT 22.2 (*)    MCH 25.7 (*)    Lymphocytes Relative 8 (*)    Lymphs Abs 0.5 (*)    Eosinophils Relative 8 (*)    All other components within normal limits  BASIC METABOLIC PANEL - Abnormal; Notable for the following:    BUN 28 (*)    Creatinine, Ser 9.12 (*)    GFR calc non Af Amer 7 (*)    GFR calc Af Amer 8 (*)    All other components within normal limits  URINE CULTURE  APTT  URINE MICROSCOPIC-ADD ON    Imaging Review No results found.   EKG Interpretation None      MDM   Final diagnoses:  Hematuria   12:06 PM Discussed with Dr. Domingo Mend who feels patient needs further evaluation, but can likely do this as an outpatient. Will discuss strict reasons to return to ED immediately. If patient feels he can make it to [facility] emergency department I will encourage him to go there. Dr.  Domingo Mend feels we should be concerned if patient can not urinate.   Patient is hemodynamically stable, well appearing with hematuria. Patient is normotensive with regular heart rate. He is not longer passing clots in his urine, but continues to pass gross blood. UA shows 11-20 WBC and too numerous to count RBC. Will treat for UTI. IV rocephin in ED and rx for Keflex. Discussed with patient that he needs further evaluation and he will call his transplant coordinator tomorrow to be seen in the clinic. Patient is anemic at 7.0 which appears stable. Patient denies pain or difficulty urinating. Gave strict return instructions. Patient is reasonable for follow up. Discussed case with Dr. Stevie Kern who agrees with plan. Vital signs stable for discharge. Patient / Family / Caregiver informed of clinical course, understand medical decision-making process, and  agree with plan.   Elwyn Lade, PA-C 12/04/13 1553

## 2013-12-04 NOTE — ED Notes (Signed)
Pt reports hematuria and small blood clots since yesterday. Pt discharged from hospital on 5/18 for kidney failure. States while in hospital he was on heparin. Last received dialysis yesterday. Denies pain. Denies dysuria. Denies fever/chills.

## 2013-12-04 NOTE — Discharge Instructions (Signed)
Please call your transplant coordinator tomorrow morning to been seen in the clinic. If you begin to feel worse, develop a fever, are unable to urinate, or have any symptoms concerning for you please return to the [facility] Emergency Department or closest emergency department.   Hematuria, Adult Hematuria is blood in your urine. It can be caused by a bladder infection, kidney infection, prostate infection, kidney stone, or cancer of your urinary tract. Infections can usually be treated with medicine, and a kidney stone usually will pass through your urine. If neither of these is the cause of your hematuria, further workup to find out the reason may be needed. It is very important that you tell your health care provider about any blood you see in your urine, even if the blood stops without treatment or happens without causing pain. Blood in your urine that happens and then stops and then happens again can be a symptom of a very serious condition. Also, pain is not a symptom in the initial stages of many urinary cancers. HOME CARE INSTRUCTIONS   Drink lots of fluid, 3-4 quarts a day. If you have been diagnosed with an infection, cranberry juice is especially recommended, in addition to large amounts of water.  Avoid caffeine, tea, and carbonated beverages, because they tend to irritate the bladder.  Avoid alcohol because it may irritate the prostate.  Only take over-the-counter or prescription medicines for pain, discomfort, or fever as directed by your health care provider.  If you have been diagnosed with a kidney stone, follow your health care provider's instructions regarding straining your urine to catch the stone.  Empty your bladder often. Avoid holding urine for long periods of time.  After a bowel movement, women should cleanse front to back. Use each tissue only once.  Empty your bladder before and after sexual intercourse if you are a male. SEEK MEDICAL CARE IF: You develop back pain,  fever, a feeling of sickness in your stomach (nausea), or vomiting or if your symptoms are not better in 3 days. Return sooner if you are getting worse. SEEK IMMEDIATE MEDICAL CARE IF:   You have a persistent fever, with a temperature of 101.72F (38.8C) or greater.  You develop severe vomiting and are unable to keep the medicine down.  You develop severe back or abdominal pain despite taking your medicines.  You begin passing a large amount of blood or clots in your urine.  You feel extremely weak or faint, or you pass out. MAKE SURE YOU:   Understand these instructions.  Will watch your condition.  Will get help right away if you are not doing well or get worse. Document Released: 06/02/2005 Document Revised: 03/23/2013 Document Reviewed: 01/31/2013 [facility] Patient Information 2015 [place], [place]. This information is not intended to replace advice given to you by your health care provider. Make sure you discuss any questions you have with your health care provider.

## 2013-12-05 LAB — URINE CULTURE: Colony Count: 1000

## 2013-12-05 NOTE — ED Provider Notes (Signed)
Medical screening examination/treatment/procedure(s) were performed by non-physician practitioner and as supervising physician I was immediately available for consultation/collaboration.   EKG Interpretation None       Babette Relic, MD 12/05/13 1420

## 2013-12-06 DIAGNOSIS — D631 Anemia in chronic kidney disease: Secondary | ICD-10-CM | POA: Diagnosis not present

## 2013-12-06 DIAGNOSIS — N186 End stage renal disease: Secondary | ICD-10-CM | POA: Diagnosis not present

## 2013-12-06 DIAGNOSIS — N2581 Secondary hyperparathyroidism of renal origin: Secondary | ICD-10-CM | POA: Diagnosis not present

## 2013-12-08 DIAGNOSIS — D631 Anemia in chronic kidney disease: Secondary | ICD-10-CM | POA: Diagnosis not present

## 2013-12-08 DIAGNOSIS — N2581 Secondary hyperparathyroidism of renal origin: Secondary | ICD-10-CM | POA: Diagnosis not present

## 2013-12-08 DIAGNOSIS — N186 End stage renal disease: Secondary | ICD-10-CM | POA: Diagnosis not present

## 2013-12-10 DIAGNOSIS — N2581 Secondary hyperparathyroidism of renal origin: Secondary | ICD-10-CM | POA: Diagnosis not present

## 2013-12-10 DIAGNOSIS — N186 End stage renal disease: Secondary | ICD-10-CM | POA: Diagnosis not present

## 2013-12-10 DIAGNOSIS — D631 Anemia in chronic kidney disease: Secondary | ICD-10-CM | POA: Diagnosis not present

## 2013-12-13 DIAGNOSIS — N039 Chronic nephritic syndrome with unspecified morphologic changes: Secondary | ICD-10-CM | POA: Diagnosis not present

## 2013-12-13 DIAGNOSIS — D631 Anemia in chronic kidney disease: Secondary | ICD-10-CM | POA: Diagnosis not present

## 2013-12-13 DIAGNOSIS — N186 End stage renal disease: Secondary | ICD-10-CM | POA: Diagnosis not present

## 2013-12-13 DIAGNOSIS — N2581 Secondary hyperparathyroidism of renal origin: Secondary | ICD-10-CM | POA: Diagnosis not present

## 2013-12-15 DIAGNOSIS — D509 Iron deficiency anemia, unspecified: Secondary | ICD-10-CM | POA: Diagnosis not present

## 2013-12-15 DIAGNOSIS — N186 End stage renal disease: Secondary | ICD-10-CM | POA: Diagnosis not present

## 2013-12-15 DIAGNOSIS — N2581 Secondary hyperparathyroidism of renal origin: Secondary | ICD-10-CM | POA: Diagnosis not present

## 2013-12-15 DIAGNOSIS — D631 Anemia in chronic kidney disease: Secondary | ICD-10-CM | POA: Diagnosis not present

## 2014-01-02 DIAGNOSIS — F39 Unspecified mood [affective] disorder: Secondary | ICD-10-CM | POA: Diagnosis not present

## 2014-01-09 DIAGNOSIS — F39 Unspecified mood [affective] disorder: Secondary | ICD-10-CM | POA: Diagnosis not present

## 2014-01-13 DIAGNOSIS — N186 End stage renal disease: Secondary | ICD-10-CM | POA: Diagnosis not present

## 2014-01-14 DIAGNOSIS — D631 Anemia in chronic kidney disease: Secondary | ICD-10-CM | POA: Diagnosis not present

## 2014-01-14 DIAGNOSIS — N2581 Secondary hyperparathyroidism of renal origin: Secondary | ICD-10-CM | POA: Diagnosis not present

## 2014-01-14 DIAGNOSIS — D509 Iron deficiency anemia, unspecified: Secondary | ICD-10-CM | POA: Diagnosis not present

## 2014-01-14 DIAGNOSIS — N186 End stage renal disease: Secondary | ICD-10-CM | POA: Diagnosis not present

## 2014-02-13 DIAGNOSIS — N186 End stage renal disease: Secondary | ICD-10-CM | POA: Diagnosis not present

## 2014-02-14 DIAGNOSIS — F39 Unspecified mood [affective] disorder: Secondary | ICD-10-CM | POA: Diagnosis not present

## 2014-02-14 DIAGNOSIS — N2581 Secondary hyperparathyroidism of renal origin: Secondary | ICD-10-CM | POA: Diagnosis not present

## 2014-02-14 DIAGNOSIS — D631 Anemia in chronic kidney disease: Secondary | ICD-10-CM | POA: Diagnosis not present

## 2014-02-14 DIAGNOSIS — D509 Iron deficiency anemia, unspecified: Secondary | ICD-10-CM | POA: Diagnosis not present

## 2014-02-14 DIAGNOSIS — N186 End stage renal disease: Secondary | ICD-10-CM | POA: Diagnosis not present

## 2014-03-01 DIAGNOSIS — Z91199 Patient's noncompliance with other medical treatment and regimen due to unspecified reason: Secondary | ICD-10-CM | POA: Diagnosis not present

## 2014-03-01 DIAGNOSIS — R319 Hematuria, unspecified: Secondary | ICD-10-CM | POA: Diagnosis not present

## 2014-03-01 DIAGNOSIS — Z992 Dependence on renal dialysis: Secondary | ICD-10-CM | POA: Diagnosis not present

## 2014-03-01 DIAGNOSIS — Z9119 Patient's noncompliance with other medical treatment and regimen: Secondary | ICD-10-CM | POA: Diagnosis not present

## 2014-03-01 DIAGNOSIS — I10 Essential (primary) hypertension: Secondary | ICD-10-CM | POA: Diagnosis not present

## 2014-03-01 DIAGNOSIS — T8610 Unspecified complication of kidney transplant: Secondary | ICD-10-CM | POA: Diagnosis not present

## 2014-03-01 DIAGNOSIS — N186 End stage renal disease: Secondary | ICD-10-CM | POA: Diagnosis not present

## 2014-03-15 DIAGNOSIS — N186 End stage renal disease: Secondary | ICD-10-CM | POA: Diagnosis not present

## 2014-03-16 DIAGNOSIS — T451X6A Underdosing of antineoplastic and immunosuppressive drugs, initial encounter: Secondary | ICD-10-CM | POA: Diagnosis present

## 2014-03-16 DIAGNOSIS — N179 Acute kidney failure, unspecified: Secondary | ICD-10-CM | POA: Diagnosis present

## 2014-03-16 DIAGNOSIS — I499 Cardiac arrhythmia, unspecified: Secondary | ICD-10-CM | POA: Diagnosis not present

## 2014-03-16 DIAGNOSIS — Z992 Dependence on renal dialysis: Secondary | ICD-10-CM | POA: Diagnosis not present

## 2014-03-16 DIAGNOSIS — E875 Hyperkalemia: Secondary | ICD-10-CM | POA: Diagnosis not present

## 2014-03-16 DIAGNOSIS — I120 Hypertensive chronic kidney disease with stage 5 chronic kidney disease or end stage renal disease: Secondary | ICD-10-CM | POA: Diagnosis present

## 2014-03-16 DIAGNOSIS — Z9114 Patient's other noncompliance with medication regimen: Secondary | ICD-10-CM | POA: Diagnosis present

## 2014-03-16 DIAGNOSIS — Z23 Encounter for immunization: Secondary | ICD-10-CM | POA: Diagnosis not present

## 2014-03-16 DIAGNOSIS — D62 Acute posthemorrhagic anemia: Secondary | ICD-10-CM | POA: Diagnosis present

## 2014-03-16 DIAGNOSIS — N261 Atrophy of kidney (terminal): Secondary | ICD-10-CM | POA: Diagnosis not present

## 2014-03-16 DIAGNOSIS — T8611 Kidney transplant rejection: Secondary | ICD-10-CM | POA: Diagnosis not present

## 2014-03-16 DIAGNOSIS — T8612 Kidney transplant failure: Secondary | ICD-10-CM | POA: Diagnosis not present

## 2014-03-16 DIAGNOSIS — R319 Hematuria, unspecified: Secondary | ICD-10-CM | POA: Diagnosis present

## 2014-03-16 DIAGNOSIS — N2581 Secondary hyperparathyroidism of renal origin: Secondary | ICD-10-CM | POA: Diagnosis not present

## 2014-03-16 DIAGNOSIS — T8610 Unspecified complication of kidney transplant: Secondary | ICD-10-CM | POA: Diagnosis not present

## 2014-03-16 DIAGNOSIS — N1330 Unspecified hydronephrosis: Secondary | ICD-10-CM | POA: Diagnosis not present

## 2014-03-16 DIAGNOSIS — D631 Anemia in chronic kidney disease: Secondary | ICD-10-CM | POA: Diagnosis present

## 2014-03-16 DIAGNOSIS — Z01818 Encounter for other preprocedural examination: Secondary | ICD-10-CM | POA: Diagnosis not present

## 2014-03-16 DIAGNOSIS — N186 End stage renal disease: Secondary | ICD-10-CM | POA: Diagnosis not present

## 2014-03-16 DIAGNOSIS — D509 Iron deficiency anemia, unspecified: Secondary | ICD-10-CM | POA: Diagnosis not present

## 2014-03-16 DIAGNOSIS — H9190 Unspecified hearing loss, unspecified ear: Secondary | ICD-10-CM | POA: Diagnosis present

## 2014-03-16 DIAGNOSIS — Q8781 Alport syndrome: Secondary | ICD-10-CM | POA: Diagnosis not present

## 2014-03-16 DIAGNOSIS — Z87891 Personal history of nicotine dependence: Secondary | ICD-10-CM | POA: Diagnosis not present

## 2014-03-20 HISTORY — PX: OTHER SURGICAL HISTORY: SHX169

## 2014-04-03 DIAGNOSIS — Z79899 Other long term (current) drug therapy: Secondary | ICD-10-CM | POA: Diagnosis not present

## 2014-04-03 DIAGNOSIS — Z940 Kidney transplant status: Secondary | ICD-10-CM | POA: Diagnosis not present

## 2014-04-03 DIAGNOSIS — Z4822 Encounter for aftercare following kidney transplant: Secondary | ICD-10-CM | POA: Diagnosis not present

## 2014-04-03 DIAGNOSIS — Z905 Acquired absence of kidney: Secondary | ICD-10-CM | POA: Diagnosis not present

## 2014-04-06 ENCOUNTER — Ambulatory Visit (HOSPITAL_COMMUNITY)
Admission: RE | Admit: 2014-04-06 | Discharge: 2014-04-06 | Disposition: A | Discharge status: Home / Self Care | Payer: Medicare Other | Source: Ambulatory Visit | Attending: Vascular Surgery | Admitting: Vascular Surgery

## 2014-04-06 ENCOUNTER — Other Ambulatory Visit (HOSPITAL_COMMUNITY): Payer: Self-pay | Admitting: Nephrology

## 2014-04-06 DIAGNOSIS — M7989 Other specified soft tissue disorders: Secondary | ICD-10-CM | POA: Insufficient documentation

## 2014-04-06 DIAGNOSIS — M79601 Pain in right arm: Secondary | ICD-10-CM | POA: Diagnosis not present

## 2014-04-06 DIAGNOSIS — M79604 Pain in right leg: Secondary | ICD-10-CM | POA: Diagnosis not present

## 2014-04-14 ENCOUNTER — Ambulatory Visit (HOSPITAL_COMMUNITY)
Admission: RE | Admit: 2014-04-14 | Discharge: 2014-04-14 | Disposition: A | Discharge status: Home / Self Care | Payer: Medicare Other | Source: Ambulatory Visit | Attending: Nephrology | Admitting: Nephrology

## 2014-04-14 DIAGNOSIS — I10 Essential (primary) hypertension: Secondary | ICD-10-CM | POA: Diagnosis not present

## 2014-04-14 DIAGNOSIS — Z905 Acquired absence of kidney: Secondary | ICD-10-CM | POA: Diagnosis not present

## 2014-04-14 DIAGNOSIS — D631 Anemia in chronic kidney disease: Secondary | ICD-10-CM | POA: Diagnosis not present

## 2014-04-14 DIAGNOSIS — Z940 Kidney transplant status: Secondary | ICD-10-CM | POA: Diagnosis not present

## 2014-04-14 DIAGNOSIS — N186 End stage renal disease: Secondary | ICD-10-CM | POA: Insufficient documentation

## 2014-04-14 LAB — ABO/RH: ABO/RH(D): A POS

## 2014-04-15 DIAGNOSIS — N186 End stage renal disease: Secondary | ICD-10-CM | POA: Diagnosis not present

## 2014-04-15 DIAGNOSIS — Z992 Dependence on renal dialysis: Secondary | ICD-10-CM | POA: Diagnosis not present

## 2014-04-17 ENCOUNTER — Encounter (HOSPITAL_COMMUNITY)
Admission: RE | Admit: 2014-04-17 | Discharge: 2014-04-17 | Disposition: A | Discharge status: Home / Self Care | Payer: Medicare Other | Source: Ambulatory Visit | Attending: Nephrology | Admitting: Nephrology

## 2014-04-17 ENCOUNTER — Other Ambulatory Visit (HOSPITAL_COMMUNITY): Payer: Self-pay | Admitting: Nephrology

## 2014-04-17 ENCOUNTER — Encounter (HOSPITAL_COMMUNITY): Payer: Self-pay

## 2014-04-17 DIAGNOSIS — D631 Anemia in chronic kidney disease: Secondary | ICD-10-CM | POA: Diagnosis not present

## 2014-04-17 HISTORY — DX: Unspecified kidney failure: N19

## 2014-04-17 HISTORY — DX: Gastro-esophageal reflux disease without esophagitis: K21.9

## 2014-04-17 MED ORDER — ACETAMINOPHEN 325 MG PO TABS
650.0000 mg | ORAL_TABLET | Freq: Once | ORAL | Status: AC
  Administered 2014-04-17: 650 mg via ORAL
  Filled 2014-04-17: qty 2

## 2014-04-17 MED ORDER — DIPHENHYDRAMINE HCL 25 MG PO CAPS
25.0000 mg | ORAL_CAPSULE | Freq: Once | ORAL | Status: AC
  Administered 2014-04-17: 25 mg via ORAL
  Filled 2014-04-17: qty 1

## 2014-04-17 MED ORDER — SODIUM CHLORIDE 0.9 % IV SOLN
Freq: Once | INTRAVENOUS | Status: AC
  Administered 2014-04-17: 500 mL via INTRAVENOUS

## 2014-04-17 NOTE — Discharge Instructions (Signed)
Anemia, Nonspecific Anemia is a condition in which the concentration of red blood cells or hemoglobin in the blood is below normal. Hemoglobin is a substance in red blood cells that carries oxygen to the tissues of the body. Anemia results in not enough oxygen reaching these tissues.  CAUSES  Common causes of anemia include:   Excessive bleeding. Bleeding may be internal or external. This includes excessive bleeding from periods (in women) or from the intestine.   Poor nutrition.   Chronic kidney, thyroid, and liver disease.  Bone marrow disorders that decrease red blood cell production.  Cancer and treatments for cancer.  HIV, AIDS, and their treatments.  Spleen problems that increase red blood cell destruction.  Blood disorders.  Excess destruction of red blood cells due to infection, medicines, and autoimmune disorders. SIGNS AND SYMPTOMS   Minor weakness.   Dizziness.   Headache.  Palpitations.   Shortness of breath, especially with exercise.   Paleness.  Cold sensitivity.  Indigestion.  Nausea.  Difficulty sleeping.  Difficulty concentrating. Symptoms may occur suddenly or they may develop slowly.  DIAGNOSIS  Additional blood tests are often needed. These help your health care provider determine the best treatment. Your health care provider will check your stool for blood and look for other causes of blood loss.  TREATMENT  Treatment varies depending on the cause of the anemia. Treatment can include:   Supplements of iron, vitamin V94, or folic acid.   Hormone medicines.   A blood transfusion. This may be needed if blood loss is severe.   Hospitalization. This may be needed if there is significant continual blood loss.   Dietary changes.  Spleen removal. HOME CARE INSTRUCTIONS Keep all follow-up appointments. It often takes many weeks to correct anemia, and having your health care provider check on your condition and your response to  treatment is very important. SEEK IMMEDIATE MEDICAL CARE IF:   You develop extreme weakness, shortness of breath, or chest pain.   You become dizzy or have trouble concentrating.  You develop heavy vaginal bleeding.   You develop a rash.   You have bloody or black, tarry stools.   You faint.   You vomit up blood.   You vomit repeatedly.   You have abdominal pain.  You have a fever or persistent symptoms for more than 2-3 days.   You have a fever and your symptoms suddenly get worse.   You are dehydrated.  MAKE SURE YOU:  Understand these instructions.  Will watch your condition.  Will get help right away if you are not doing well or get worse. Document Released: 07/10/2004 Document Revised: 02/02/2013 Document Reviewed: 11/26/2012 [facility] Patient Information 2015 [place], [place]. This information is not intended to replace advice given to you by your health care provider. Make sure you discuss any questions you have with your health care provider. Blood Transfusion Information WHAT IS A BLOOD TRANSFUSION? A transfusion is the replacement of blood or some of its parts. Blood is made up of multiple cells which provide different functions.  Red blood cells carry oxygen and are used for blood loss replacement.  White blood cells fight against infection.  Platelets control bleeding.  Plasma helps clot blood.  Other blood products are available for specialized needs, such as hemophilia or other clotting disorders. BEFORE THE TRANSFUSION  Who gives blood for transfusions?   You may be able to donate blood to be used at a later date on yourself (autologous donation).  Relatives can be  asked to donate blood. This is generally not any safer than if you have received blood from a stranger. The same precautions are taken to ensure safety when a relative's blood is donated.  Healthy volunteers who are fully evaluated to make sure their blood is safe. This is blood  bank blood. Transfusion therapy is the safest it has ever been in the practice of medicine. Before blood is taken from a donor, a complete history is taken to make sure that person has no history of diseases nor engages in risky social behavior (examples are intravenous drug use or sexual activity with multiple partners). The donor's travel history is screened to minimize risk of transmitting infections, such as malaria. The donated blood is tested for signs of infectious diseases, such as HIV and hepatitis. The blood is then tested to be sure it is compatible with you in order to minimize the chance of a transfusion reaction. If you or a relative donates blood, this is often done in anticipation of surgery and is not appropriate for emergency situations. It takes many days to process the donated blood. RISKS AND COMPLICATIONS Although transfusion therapy is very safe and saves many lives, the main dangers of transfusion include:   Getting an infectious disease.  Developing a transfusion reaction. This is an allergic reaction to something in the blood you were given. Every precaution is taken to prevent this. The decision to have a blood transfusion has been considered carefully by your caregiver before blood is given. Blood is not given unless the benefits outweigh the risks. AFTER THE TRANSFUSION  Right after receiving a blood transfusion, you will usually feel much better and more energetic. This is especially true if your red blood cells have gotten low (anemic). The transfusion raises the level of the red blood cells which carry oxygen, and this usually causes an energy increase.  The nurse administering the transfusion will monitor you carefully for complications. HOME CARE INSTRUCTIONS  No special instructions are needed after a transfusion. You may find your energy is better. Speak with your caregiver about any limitations on activity for underlying diseases you may have. SEEK MEDICAL CARE  IF:   Your condition is not improving after your transfusion.  You develop redness or irritation at the intravenous (IV) site. SEEK IMMEDIATE MEDICAL CARE IF:  Any of the following symptoms occur over the next 12 hours:  Shaking chills.  You have a temperature by mouth above 102 F (38.9 C), not controlled by medicine.  Chest, back, or muscle pain.  People around you feel you are not acting correctly or are confused.  Shortness of breath or difficulty breathing.  Dizziness and fainting.  You get a rash or develop hives.  You have a decrease in urine output.  Your urine turns a dark color or changes to pink, red, or brown. Any of the following symptoms occur over the next 10 days:  You have a temperature by mouth above 102 F (38.9 C), not controlled by medicine.  Shortness of breath.  Weakness after normal activity.  The white part of the eye turns yellow (jaundice).  You have a decrease in the amount of urine or are urinating less often.  Your urine turns a dark color or changes to pink, red, or brown. Document Released: 05/30/2000 Document Revised: 08/25/2011 Document Reviewed: 01/17/2008 [facility] Patient Information 2015 [place], [place]. This information is not intended to replace advice given to you by your health care provider. Make sure you discuss any questions  you have with your health care provider.

## 2014-04-18 DIAGNOSIS — D631 Anemia in chronic kidney disease: Secondary | ICD-10-CM | POA: Diagnosis not present

## 2014-04-18 DIAGNOSIS — D509 Iron deficiency anemia, unspecified: Secondary | ICD-10-CM | POA: Diagnosis not present

## 2014-04-18 DIAGNOSIS — N186 End stage renal disease: Secondary | ICD-10-CM | POA: Diagnosis not present

## 2014-04-18 DIAGNOSIS — N2581 Secondary hyperparathyroidism of renal origin: Secondary | ICD-10-CM | POA: Diagnosis not present

## 2014-04-18 LAB — TYPE AND SCREEN
ABO/RH(D): A POS
ANTIBODY SCREEN: NEGATIVE
UNIT DIVISION: 0

## 2014-04-24 DIAGNOSIS — F99 Mental disorder, not otherwise specified: Secondary | ICD-10-CM | POA: Diagnosis not present

## 2014-04-25 ENCOUNTER — Emergency Department (HOSPITAL_COMMUNITY): Payer: Medicare Other

## 2014-04-25 ENCOUNTER — Inpatient Hospital Stay (HOSPITAL_COMMUNITY)
Admission: EM | Admit: 2014-04-25 | Discharge: 2014-04-27 | DRG: 291 | Disposition: A | Discharge status: Home / Self Care | Payer: Medicare Other | Attending: Infectious Diseases | Admitting: Infectious Diseases

## 2014-04-25 ENCOUNTER — Other Ambulatory Visit: Payer: Self-pay

## 2014-04-25 ENCOUNTER — Encounter (HOSPITAL_COMMUNITY): Payer: Self-pay

## 2014-04-25 DIAGNOSIS — I5021 Acute systolic (congestive) heart failure: Principal | ICD-10-CM | POA: Diagnosis present

## 2014-04-25 DIAGNOSIS — N2581 Secondary hyperparathyroidism of renal origin: Secondary | ICD-10-CM | POA: Diagnosis not present

## 2014-04-25 DIAGNOSIS — J189 Pneumonia, unspecified organism: Secondary | ICD-10-CM | POA: Diagnosis not present

## 2014-04-25 DIAGNOSIS — R079 Chest pain, unspecified: Secondary | ICD-10-CM

## 2014-04-25 DIAGNOSIS — N186 End stage renal disease: Secondary | ICD-10-CM | POA: Diagnosis not present

## 2014-04-25 DIAGNOSIS — Q8781 Alport syndrome: Secondary | ICD-10-CM | POA: Diagnosis not present

## 2014-04-25 DIAGNOSIS — K219 Gastro-esophageal reflux disease without esophagitis: Secondary | ICD-10-CM | POA: Diagnosis present

## 2014-04-25 DIAGNOSIS — J80 Acute respiratory distress syndrome: Secondary | ICD-10-CM | POA: Diagnosis present

## 2014-04-25 DIAGNOSIS — E8779 Other fluid overload: Secondary | ICD-10-CM | POA: Diagnosis not present

## 2014-04-25 DIAGNOSIS — D631 Anemia in chronic kidney disease: Secondary | ICD-10-CM | POA: Diagnosis present

## 2014-04-25 DIAGNOSIS — J984 Other disorders of lung: Secondary | ICD-10-CM | POA: Diagnosis not present

## 2014-04-25 DIAGNOSIS — J182 Hypostatic pneumonia, unspecified organism: Secondary | ICD-10-CM | POA: Diagnosis not present

## 2014-04-25 DIAGNOSIS — J810 Acute pulmonary edema: Secondary | ICD-10-CM | POA: Diagnosis present

## 2014-04-25 DIAGNOSIS — R0600 Dyspnea, unspecified: Secondary | ICD-10-CM

## 2014-04-25 DIAGNOSIS — I319 Disease of pericardium, unspecified: Secondary | ICD-10-CM | POA: Diagnosis not present

## 2014-04-25 DIAGNOSIS — R072 Precordial pain: Secondary | ICD-10-CM | POA: Diagnosis not present

## 2014-04-25 DIAGNOSIS — Z905 Acquired absence of kidney: Secondary | ICD-10-CM | POA: Diagnosis present

## 2014-04-25 DIAGNOSIS — J811 Chronic pulmonary edema: Secondary | ICD-10-CM

## 2014-04-25 DIAGNOSIS — I10 Essential (primary) hypertension: Secondary | ICD-10-CM | POA: Diagnosis present

## 2014-04-25 DIAGNOSIS — G43909 Migraine, unspecified, not intractable, without status migrainosus: Secondary | ICD-10-CM | POA: Diagnosis present

## 2014-04-25 DIAGNOSIS — I120 Hypertensive chronic kidney disease with stage 5 chronic kidney disease or end stage renal disease: Secondary | ICD-10-CM | POA: Diagnosis present

## 2014-04-25 DIAGNOSIS — Z992 Dependence on renal dialysis: Secondary | ICD-10-CM | POA: Diagnosis not present

## 2014-04-25 DIAGNOSIS — D649 Anemia, unspecified: Secondary | ICD-10-CM | POA: Diagnosis not present

## 2014-04-25 DIAGNOSIS — Z940 Kidney transplant status: Secondary | ICD-10-CM

## 2014-04-25 DIAGNOSIS — Z87891 Personal history of nicotine dependence: Secondary | ICD-10-CM

## 2014-04-25 LAB — BASIC METABOLIC PANEL
ANION GAP: 19 — AB (ref 5–15)
BUN: 52 mg/dL — ABNORMAL HIGH (ref 6–23)
CO2: 25 mEq/L (ref 19–32)
Calcium: 9.6 mg/dL (ref 8.4–10.5)
Chloride: 99 mEq/L (ref 96–112)
Creatinine, Ser: 13.39 mg/dL — ABNORMAL HIGH (ref 0.50–1.35)
GFR calc non Af Amer: 4 mL/min — ABNORMAL LOW (ref >90)
GFR, EST AFRICAN AMERICAN: 5 mL/min — AB (ref >90)
Glucose, Bld: 92 mg/dL (ref 70–99)
POTASSIUM: 4.2 meq/L (ref 3.7–5.3)
SODIUM: 143 meq/L (ref 137–147)

## 2014-04-25 LAB — I-STAT ARTERIAL BLOOD GAS, ED
Acid-Base Excess: 2 mmol/L (ref 0.0–2.0)
Bicarbonate: 24.3 meq/L — ABNORMAL HIGH (ref 20.0–24.0)
O2 Saturation: 93 %
Patient temperature: 98.6
TCO2: 25 mmol/L (ref 0–100)
pCO2 arterial: 29.6 mmHg — ABNORMAL LOW (ref 35.0–45.0)
pH, Arterial: 7.523 — ABNORMAL HIGH (ref 7.350–7.450)
pO2, Arterial: 57 mmHg — ABNORMAL LOW (ref 80.0–100.0)

## 2014-04-25 LAB — CBC
HCT: 28.5 % — ABNORMAL LOW (ref 39.0–52.0)
Hemoglobin: 9 g/dL — ABNORMAL LOW (ref 13.0–17.0)
MCH: 23.1 pg — ABNORMAL LOW (ref 26.0–34.0)
MCHC: 31.6 g/dL (ref 30.0–36.0)
MCV: 73.3 fL — ABNORMAL LOW (ref 78.0–100.0)
PLATELETS: 285 10*3/uL (ref 150–400)
RBC: 3.89 MIL/uL — ABNORMAL LOW (ref 4.22–5.81)
RDW: 25.9 % — AB (ref 11.5–15.5)
WBC: 11.4 10*3/uL — AB (ref 4.0–10.5)

## 2014-04-25 LAB — I-STAT CHEM 8, ED
BUN: 49 mg/dL — ABNORMAL HIGH (ref 6–23)
CALCIUM ION: 1.13 mmol/L (ref 1.12–1.23)
Chloride: 103 mEq/L (ref 96–112)
Creatinine, Ser: 14.2 mg/dL — ABNORMAL HIGH (ref 0.50–1.35)
Glucose, Bld: 90 mg/dL (ref 70–99)
HEMATOCRIT: 31 % — AB (ref 39.0–52.0)
Hemoglobin: 10.5 g/dL — ABNORMAL LOW (ref 13.0–17.0)
Potassium: 4 mEq/L (ref 3.7–5.3)
Sodium: 142 mEq/L (ref 137–147)
TCO2: 24 mmol/L (ref 0–100)

## 2014-04-25 LAB — I-STAT TROPONIN, ED: TROPONIN I, POC: 0.03 ng/mL (ref 0.00–0.08)

## 2014-04-25 LAB — TROPONIN I
Troponin I: <0.3 ng/mL (ref <0.30)
Troponin I: <0.3 ng/mL
Troponin I: <0.3 ng/mL (ref <0.30)

## 2014-04-25 LAB — MRSA PCR SCREENING: MRSA BY PCR: NEGATIVE

## 2014-04-25 LAB — I-STAT CG4 LACTIC ACID, ED: LACTIC ACID, VENOUS: 1.09 mmol/L (ref 0.5–2.2)

## 2014-04-25 LAB — PHOSPHORUS: PHOSPHORUS: 2.7 mg/dL (ref 2.3–4.6)

## 2014-04-25 MED ORDER — VANCOMYCIN HCL IN DEXTROSE 750-5 MG/150ML-% IV SOLN: 750.0000 mg | INTRAVENOUS | Status: DC

## 2014-04-25 MED ORDER — PANTOPRAZOLE SODIUM 40 MG PO TBEC
40.0000 mg | DELAYED_RELEASE_TABLET | Freq: Every day | ORAL | Status: DC
  Administered 2014-04-25 – 2014-04-27 (×3): 40 mg via ORAL
  Filled 2014-04-25 (×3): qty 1

## 2014-04-25 MED ORDER — VANCOMYCIN HCL IN DEXTROSE 750-5 MG/150ML-% IV SOLN
750.0000 mg | INTRAVENOUS | Status: DC
  Administered 2014-04-25: 750 mg via INTRAVENOUS
  Filled 2014-04-25 (×2): qty 150

## 2014-04-25 MED ORDER — DARBEPOETIN ALFA 200 MCG/0.4ML IJ SOSY
200.0000 ug | PREFILLED_SYRINGE | INTRAMUSCULAR | Status: DC
  Administered 2014-04-25: 200 ug via INTRAVENOUS
  Filled 2014-04-25: qty 0.4

## 2014-04-25 MED ORDER — AMLODIPINE BESYLATE 5 MG PO TABS
5.0000 mg | ORAL_TABLET | Freq: Every day | ORAL | Status: DC
  Administered 2014-04-25: 5 mg via ORAL
  Filled 2014-04-25: qty 1

## 2014-04-25 MED ORDER — VANCOMYCIN HCL 10 G IV SOLR
1250.0000 mg | Freq: Once | INTRAVENOUS | Status: AC
  Administered 2014-04-25: 1250 mg via INTRAVENOUS
  Filled 2014-04-25: qty 1250

## 2014-04-25 MED ORDER — ACETAMINOPHEN 325 MG PO TABS
ORAL_TABLET | ORAL | Status: AC
  Filled 2014-04-25: qty 2

## 2014-04-25 MED ORDER — ASPIRIN-ACETAMINOPHEN-CAFFEINE 250-250-65 MG PO TABS
1.0000 | ORAL_TABLET | Freq: Three times a day (TID) | ORAL | Status: DC | PRN
  Administered 2014-04-25: 1 via ORAL
  Filled 2014-04-25 (×3): qty 1

## 2014-04-25 MED ORDER — DEXTROSE 5 % IV SOLN
2.0000 g | INTRAVENOUS | Status: DC
  Administered 2014-04-25: 2 g via INTRAVENOUS
  Filled 2014-04-25: qty 2

## 2014-04-25 MED ORDER — PNEUMOCOCCAL VAC POLYVALENT 25 MCG/0.5ML IJ INJ: 0.5000 mL | INJECTION | INTRAMUSCULAR | Status: DC | PRN

## 2014-04-25 MED ORDER — HYDROCOD POLST-CHLORPHEN POLST 10-8 MG/5ML PO LQCR
5.0000 mL | Freq: Once | ORAL | Status: AC
  Administered 2014-04-25: 5 mL via ORAL
  Filled 2014-04-25: qty 5

## 2014-04-25 MED ORDER — CINACALCET HCL 30 MG PO TABS
30.0000 mg | ORAL_TABLET | Freq: Every day | ORAL | Status: DC
  Administered 2014-04-26: 30 mg via ORAL
  Filled 2014-04-25 (×3): qty 1

## 2014-04-25 MED ORDER — ACETAMINOPHEN 325 MG PO TABS
650.0000 mg | ORAL_TABLET | Freq: Once | ORAL | Status: AC
  Administered 2014-04-25: 650 mg via ORAL

## 2014-04-25 MED ORDER — ALBUTEROL SULFATE (2.5 MG/3ML) 0.083% IN NEBU
2.5000 mg | INHALATION_SOLUTION | Freq: Once | RESPIRATORY_TRACT | Status: AC
  Administered 2014-04-25: 2.5 mg via RESPIRATORY_TRACT
  Filled 2014-04-25: qty 3

## 2014-04-25 MED ORDER — HYDRALAZINE HCL 20 MG/ML IJ SOLN: 10.0000 mg | Freq: Three times a day (TID) | INTRAMUSCULAR | Status: DC | PRN

## 2014-04-25 MED ORDER — SEVELAMER CARBONATE 800 MG PO TABS
3200.0000 mg | ORAL_TABLET | Freq: Three times a day (TID) | ORAL | Status: DC
  Filled 2014-04-25: qty 4

## 2014-04-25 MED ORDER — RENA-VITE PO TABS
1.0000 | ORAL_TABLET | Freq: Every day | ORAL | Status: DC
  Administered 2014-04-25 – 2014-04-27 (×2): 1 via ORAL
  Filled 2014-04-25 (×3): qty 1

## 2014-04-25 MED ORDER — ONDANSETRON HCL 4 MG/2ML IJ SOLN
4.0000 mg | Freq: Once | INTRAMUSCULAR | Status: AC
  Administered 2014-04-25: 4 mg via INTRAVENOUS
  Filled 2014-04-25: qty 2

## 2014-04-25 MED ORDER — SEVELAMER CARBONATE 800 MG PO TABS
3200.0000 mg | ORAL_TABLET | Freq: Three times a day (TID) | ORAL | Status: DC
  Administered 2014-04-25: 3200 mg via ORAL
  Filled 2014-04-25 (×4): qty 4

## 2014-04-25 MED ORDER — DOCUSATE SODIUM 100 MG PO CAPS
100.0000 mg | ORAL_CAPSULE | Freq: Two times a day (BID) | ORAL | Status: DC
  Administered 2014-04-25 – 2014-04-27 (×4): 100 mg via ORAL
  Filled 2014-04-25 (×5): qty 1

## 2014-04-25 MED ORDER — HYDROCODONE-HOMATROPINE 5-1.5 MG/5ML PO SYRP: 5.0000 mL | ORAL_SOLUTION | Freq: Once | ORAL | Status: DC

## 2014-04-25 MED ORDER — AMLODIPINE BESYLATE 10 MG PO TABS
10.0000 mg | ORAL_TABLET | Freq: Every day | ORAL | Status: DC
  Administered 2014-04-26 – 2014-04-27 (×2): 10 mg via ORAL
  Filled 2014-04-25 (×2): qty 1

## 2014-04-25 MED ORDER — HEPARIN SODIUM (PORCINE) 5000 UNIT/ML IJ SOLN
5000.0000 [IU] | Freq: Three times a day (TID) | INTRAMUSCULAR | Status: DC
  Administered 2014-04-25 – 2014-04-27 (×5): 5000 [IU] via SUBCUTANEOUS
  Filled 2014-04-25 (×7): qty 1

## 2014-04-25 MED ORDER — CINACALCET HCL 30 MG PO TABS
30.0000 mg | ORAL_TABLET | Freq: Every day | ORAL | Status: DC
  Filled 2014-04-25: qty 1

## 2014-04-25 MED ORDER — DARBEPOETIN ALFA 200 MCG/0.4ML IJ SOSY
PREFILLED_SYRINGE | INTRAMUSCULAR | Status: AC
  Administered 2014-04-25: 200 ug via INTRAVENOUS
  Filled 2014-04-25: qty 0.4

## 2014-04-25 MED ORDER — CALCITRIOL 0.25 MCG PO CAPS
1.2500 ug | ORAL_CAPSULE | ORAL | Status: DC
  Administered 2014-04-25 – 2014-04-27 (×2): 1.25 ug via ORAL
  Filled 2014-04-25 (×2): qty 1

## 2014-04-25 MED ORDER — FUROSEMIDE 10 MG/ML IJ SOLN: 80.0000 mg | Freq: Once | INTRAMUSCULAR | Status: DC

## 2014-04-25 MED ORDER — SODIUM CHLORIDE 0.9 % IJ SOLN
3.0000 mL | Freq: Two times a day (BID) | INTRAMUSCULAR | Status: DC
  Administered 2014-04-25 – 2014-04-27 (×3): 3 mL via INTRAVENOUS

## 2014-04-25 MED ORDER — FUROSEMIDE 10 MG/ML IJ SOLN: 40.0000 mg | Freq: Once | INTRAMUSCULAR | Status: DC

## 2014-04-25 MED ORDER — SODIUM CHLORIDE 0.9 % IJ SOLN
3.0000 mL | INTRAMUSCULAR | Status: DC | PRN
Start: 2014-04-25 — End: 2014-04-27

## 2014-04-25 MED ORDER — DEXTROSE 5 % IV SOLN: 1.0000 g | Freq: Three times a day (TID) | INTRAVENOUS | Status: DC

## 2014-04-25 MED ORDER — PIPERACILLIN-TAZOBACTAM IN DEX 2-0.25 GM/50ML IV SOLN
2.2500 g | Freq: Once | INTRAVENOUS | Status: AC
  Administered 2014-04-25: 2.25 g via INTRAVENOUS
  Filled 2014-04-25: qty 50

## 2014-04-25 MED ORDER — FUROSEMIDE 10 MG/ML IJ SOLN
40.0000 mg | Freq: Once | INTRAMUSCULAR | Status: AC
  Administered 2014-04-25: 40 mg via INTRAVENOUS

## 2014-04-25 MED ORDER — FUROSEMIDE 10 MG/ML IJ SOLN
40.0000 mg | Freq: Once | INTRAMUSCULAR | Status: DC
  Filled 2014-04-25: qty 4

## 2014-04-25 MED ORDER — SEVELAMER CARBONATE 800 MG PO TABS
2400.0000 mg | ORAL_TABLET | Freq: Three times a day (TID) | ORAL | Status: DC
  Filled 2014-04-25 (×3): qty 3

## 2014-04-25 MED ORDER — SODIUM CHLORIDE 0.9 % IV SOLN: 250.0000 mL | INTRAVENOUS | Status: DC | PRN

## 2014-04-25 NOTE — ED Notes (Addendum)
Admitting dr notified of pt's temp and resp status -- Shanon Brow, [place] for [facility] notified-- will see.

## 2014-04-25 NOTE — ED Notes (Signed)
BIPAP being applied.

## 2014-04-25 NOTE — Consult Note (Signed)
[name] is an 28 y.o. 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  CXR in Er shows multilobar PNA vs pulm edema vs both.  In Er his resp status worsened requiring BiPAP.  He is on HD at [facility], TTS and last HD was Sat.  Past Medical History  Diagnosis Date  . Alport syndrome   . GERD (gastroesophageal reflux disease)   . Kidney failure as a baby    went on dialysis age 39  . GERD (gastroesophageal reflux disease)     Past Surgical History  Procedure Laterality Date  . Kidney transplant    . Kidney transplant    . Nephrectomy transplanted organ    . 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 Cancer Mother    Social History:  reports that he quit smoking about 16 months ago. His smoking use included Cigarettes. He smoked 0.00 packs per day. He does not have any smokeless tobacco history on file. He reports that he does not drink alcohol or use illicit drugs.  Allergies: No Known Allergies   (Not in a hospital admission)   Lab Results: UA: ND   Recent Labs  04/25/14 0655 04/25/14 0732  WBC 11.4*  --   HGB 9.0* 10.5*  HCT 28.5* 31.0*  PLT 285  --    BMET  Recent Labs  04/25/14 0655 04/25/14 0732  NA 143 142  K 4.2 4.0  CL 99 103  CO2 25  --   GLUCOSE 92 90  BUN 52* 49*  CREATININE 13.39* 14.20*  CALCIUM 9.6  --    LFT No results for input(s): PROT, ALBUMIN, AST, ALT, ALKPHOS, BILITOT, BILIDIR, IBILI in the last 72 hours. Dg Chest Port 1 View  04/25/2014   CLINICAL DATA:  Shortness of breath and cough. Last dialysis Saturday.  EXAM: PORTABLE CHEST - 1 VIEW  COMPARISON:  05/06/2012  FINDINGS: There is cardiopericardial enlargement which is new from 2013. Pericardial fluid could contribute to the size, although the morphology is very similar to prior. Negative aortic and hilar  contours. Bilateral airspace disease, fairly symmetric. No interlobular septal thickening. No effusion or pneumothorax.  IMPRESSION: 1. Bilateral airspace disease which could reflect alveolar edema or multi lobar pneumonia. 2. Cardiopericardial enlargement, new from 2013.   Electronically Signed   By: Jorje Guild M.D.   On: 04/25/2014 07:07    ROS: No change in vision + SOB + cough productive of pink frothy sputum No CP currently but has had it with coughing No dysuria No new arthritic CO's No new neuropathic CO's  PHYSICAL EXAM: Blood pressure 161/114, pulse 122, temperature 100.8 F (38.2 C), temperature source Rectal, resp. rate 23, SpO2 100 %. HEENT: PERRLA EOMI  On BiPAP NECK:No JVD LUNGS:Decreased BS bases with few crackles Rt>Lt CARDIAC:Tachy, reg no rub ABD:+ BS NTND  Well heal nephrectomy scar in RLQ EXT:No edema  Lt AVF + bruit NEURO:CNI Ox3  M&SI  No asterixis  TTS 4hr, BFR 400, DW 60kg, 2K2Ca Assessment: 1. Resp Distress sec PNA +/- pulm edema 2. Anemia on aranesp.  Heparin had been held because of gross hematuria but now Tx removed and no further hematuria 3. Sec HPTH 4. HTN PLAN: 1. HD now and try to remove 4L 2. Antibiotics 3. Renal meds  ordered 4. Check PO4 5. Increase amlodipine to $RemoveBefor'10mg'[id number]$  as this was recently increased to this dose 6. Renal diet 7. Re check CXR in AM   Nicholis Stepanek T 04/25/2014, 11:22 AM

## 2014-04-25 NOTE — ED Notes (Signed)
Admitting dr in to see.

## 2014-04-25 NOTE — ED Notes (Signed)
Pt assisted to bedside commode

## 2014-04-25 NOTE — Procedures (Signed)
Pt seen on HD.  BFR 400 Ap 150 Vp 150.  SBP 157/103.  Will try for 4 liters.  Tolerating HD and Bipap well so far.  O2 sats 100%.

## 2014-04-25 NOTE — Progress Notes (Signed)
Pt transported to dialysis on Bipap, RT aware.

## 2014-04-25 NOTE — ED Notes (Signed)
Second attempt at calling report.

## 2014-04-25 NOTE — ED Notes (Signed)
Pt vomiting from coughing; PA aware.

## 2014-04-25 NOTE — Progress Notes (Signed)
ANTIBIOTIC CONSULT NOTE - INITIAL  Pharmacy Consult for Vanco/Cefepime Indication: pneumonia  No Known Allergies  Patient Measurements:   Adjusted Body Weight:    Vital Signs: Temp: 98.7 F (37.1 C) (11/10 0752) Temp Source: Oral (11/10 0752) BP: 168/106 mmHg (11/10 0752) Pulse Rate: 135 (11/10 0752) Intake/Output from previous day:   Intake/Output from this shift:    Labs:  Recent Labs  04/25/14 0655 04/25/14 0732  WBC 11.4*  --   HGB 9.0* 10.5*  PLT 285  --   CREATININE 13.39* 14.20*   Estimated Creatinine Clearance: 6.7 mL/min (by C-G formula based on Cr of 14.2). No results for input(s): VANCOTROUGH, VANCOPEAK, VANCORANDOM, GENTTROUGH, GENTPEAK, GENTRANDOM, TOBRATROUGH, TOBRAPEAK, TOBRARND, AMIKACINPEAK, AMIKACINTROU, AMIKACIN in the last 72 hours.   Microbiology: No results found for this or any previous visit (from the past 720 hour(s)).  Medical History: Past Medical History  Diagnosis Date  . Alport syndrome   . GERD (gastroesophageal reflux disease)   . Kidney failure as a baby    went on dialysis age 58  . GERD (gastroesophageal reflux disease)     Medications:  See med rec  Assessment: CP 28 y/o M presents with CP. ESRD noted TTS. Start abx for PNA on CXR. Scr 14.2, but troponins negative, LA 1.09 ok, WBC 11.4 slightly elevated, Hgb 10.5 with anemia of chronic dz.  Goal of Therapy:  Vanco level <20 after HD prior to redosing  Plan:  Vancomycin $RemoveBef'1250mg'tUVisIUWjv$  IV x 1 given in ED. Then start $RemoveBe'750mg'CTMyEKbfr$  TTS with HD Adjusted Cefepime to 2g IV qHD   Malikhi Ogan S. Alford Highland, PharmD, [place] Clinical Staff Pharmacist Pager [phone]  [place], [place] 04/25/2014,9:33 AM

## 2014-04-25 NOTE — ED Notes (Signed)
Attempted report 

## 2014-04-25 NOTE — ED Notes (Signed)
Pt continues to cough consistently, bringing up blood tinged sputum.

## 2014-04-25 NOTE — Care Management Note (Signed)
    Page 1 of 1   04/25/2014     3:35:11 PM CARE MANAGEMENT NOTE 04/25/2014  Patient:  [name], [name]   Account Number:  [id number]  Date Initiated:  04/25/2014  Documentation initiated by:  Elissa Hefty  Subjective/Objective Assessment:   adm w pneumonia     Action/Plan:   pt from home   Anticipated DC Date:     Anticipated DC Plan:           Choice offered to / List presented to:             Status of service:   Medicare Important Message given?   (If response is "NO", the following Medicare IM given date fields will be blank) Date Medicare IM given:   Medicare IM given by:   Date Additional Medicare IM given:   Additional Medicare IM given by:    Discharge Disposition:    Per UR Regulation:  Reviewed for med. necessity/level of care/duration of stay  If discussed at [place] of Stay Meetings, dates discussed:    Comments:

## 2014-04-25 NOTE — ED Notes (Signed)
Pt from home, reports generalized, non-radiating chest pain that started this morning at 3am. Pt is a dialysis patient, T/Th/Sat. Pt also reports dry cough that started this morning.  HR-127

## 2014-04-25 NOTE — Progress Notes (Signed)
Pt taken off Bipap per pt request, Pt in no distress vitals are stable as noted

## 2014-04-25 NOTE — ED Notes (Addendum)
Resident called and at bedside; pt's sats remain low; nonrebreather placed.

## 2014-04-25 NOTE — ED Provider Notes (Addendum)
CSN: [id number]     Arrival date & time 04/25/14  3382 History   First MD Initiated Contact with Patient 04/25/14 [id number]     Chief Complaint  Patient presents with  . Chest Pain     (Consider location/radiation/quality/duration/timing/severity/associated sxs/prior Treatment) Patient is a 28 y.o. male presenting with chest pain. The history is provided by the patient. No language interpreter was used.  Chest Pain Pain location:  Substernal area Pain quality: aching and tightness   Pain radiates to:  Does not radiate Pain radiates to the back: no   Pain severity:  Moderate Onset quality:  Gradual Duration:  2 hours Timing:  Constant Progression:  Worsening Chronicity:  New Context: breathing   Relieved by:  Nothing Worsened by:  Nothing tried Ineffective treatments:  None tried Associated symptoms: shortness of breath   Risk factors: no smoking   Pt is a dialysis patient.  He had a recent transplant failure  Past Medical History  Diagnosis Date  . Alport syndrome   . GERD (gastroesophageal reflux disease)   . Kidney failure as a baby    went on dialysis age 84  . GERD (gastroesophageal reflux disease)    Past Surgical History  Procedure Laterality Date  . Kidney transplant    . Kidney transplant    . Nephrectomy transplanted organ    . 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 Cancer Mother    History  Substance Use Topics  . Smoking status: Former Smoker    Types: Cigarettes    Quit date: 12/04/2012  . Smokeless tobacco: Not on file  . Alcohol Use: No    Review of Systems  Respiratory: Positive for shortness of breath.   Cardiovascular: Positive for chest pain.      Allergies  Review of patient's allergies indicates no known allergies.  Home Medications   Prior to Admission medications   Medication Sig Start Date End Date Taking? Authorizing Provider  acetaminophen (TYLENOL) 325 MG tablet Take 325  mg by mouth daily as needed.    Historical Provider, MD  acetaminophen (TYLENOL) 500 MG tablet Take 1,000 mg by mouth every 6 (six) hours as needed.    Historical Provider, MD  amLODipine (NORVASC) 10 MG tablet Take 5 mg by mouth daily.     Historical Provider, MD  calcitRIOL (ROCALTROL) 0.25 MCG capsule Take 0.5 mcg by mouth daily.     Historical Provider, MD  cephALEXin (KEFLEX) 500 MG capsule 2 caps po bid x 7 days 12/04/13   Elwyn Lade, PA-C  cinacalcet (SENSIPAR) 30 MG tablet Take 30 mg by mouth daily.    Historical Provider, MD  cloNIDine (CATAPRES) 0.1 MG tablet Take 0.1 mg by mouth daily. 12/01/13   Historical Provider, MD  diltiazem (CARDIZEM CD) 120 MG 24 hr capsule Take 120 mg by mouth daily. 10/04/13 10/04/14  Historical Provider, MD  diphenhydrAMINE (SOMINEX) 25 MG tablet Take 25 mg by mouth at bedtime as needed for itching or sleep.    Historical Provider, MD  epoetin alfa (EPOGEN,PROCRIT) 2000 UNIT/ML injection Inject 4,000 Units into the skin 3 (three) times a week. 12/02/13   Historical Provider, MD  omeprazole (PRILOSEC) 20 MG capsule Take 20 mg by mouth daily as needed (for acid reflux).     Historical Provider, MD  sevelamer carbonate (RENVELA) 800 MG tablet Take 800 mg by mouth 3 (three) times daily. 12/01/13 12/01/14  Historical Provider, MD  vitamin  A 10000 UNIT capsule Take 7,500 Units by mouth daily. Pt is unsure of this dose    Historical Provider, MD   BP 178/119 mmHg  Pulse 123  Temp(Src) 98.9 F (37.2 C) (Oral)  Resp 20  SpO2 100% Physical Exam  Constitutional: He is oriented to person, place, and time. He appears well-developed and well-nourished.  HENT:  Head: Normocephalic and atraumatic.  Eyes: Conjunctivae and EOM are normal. Pupils are equal, round, and reactive to light.  Neck: Normal range of motion.  Cardiovascular:  tacky  Pulmonary/Chest: Breath sounds normal.  Abdominal: Soft. He exhibits no distension.  Musculoskeletal: Normal range of motion.   Neurological: He is alert and oriented to person, place, and time.  Psychiatric: He has a normal mood and affect.  Nursing note and vitals reviewed.   ED Course  Procedures (including critical care time) Labs Review Labs Reviewed  CBC  BASIC METABOLIC PANEL  TROPONIN I  I-STAT TROPOININ, ED  I-STAT CG4 LACTIC ACID, ED  I-STAT CG4 LACTIC ACID, ED    Imaging Review No results found.   EKG Interpretation None      Results for orders placed or performed during the hospital encounter of 04/25/14  CBC  Result Value Ref Range   WBC 11.4 (H) 4.0 - 10.5 K/uL   RBC 3.89 (L) 4.22 - 5.81 MIL/uL   Hemoglobin 9.0 (L) 13.0 - 17.0 g/dL   HCT 28.5 (L) 39.0 - 52.0 %   MCV 73.3 (L) 78.0 - 100.0 fL   MCH 23.1 (L) 26.0 - 34.0 pg   MCHC 31.6 30.0 - 36.0 g/dL   RDW 25.9 (H) 11.5 - 15.5 %   Platelets 285 150 - 400 K/uL  Basic metabolic panel  Result Value Ref Range   Sodium 143 137 - 147 mEq/L   Potassium 4.2 3.7 - 5.3 mEq/L   Chloride 99 96 - 112 mEq/L   CO2 25 19 - 32 mEq/L   Glucose, Bld 92 70 - 99 mg/dL   BUN 52 (H) 6 - 23 mg/dL   Creatinine, Ser 13.39 (H) 0.50 - 1.35 mg/dL   Calcium 9.6 8.4 - 10.5 mg/dL   GFR calc non Af Amer 4 (L) >90 mL/min   GFR calc Af Amer 5 (L) >90 mL/min   Anion gap 19 (H) 5 - 15  Troponin I  Result Value Ref Range   Troponin I <0.30 <0.30 ng/mL  I-stat troponin, ED (not at [facility])  Result Value Ref Range   Troponin i, poc 0.03 0.00 - 0.08 ng/mL   Comment 3          I-Stat CG4 Lactic Acid, ED  Result Value Ref Range   Lactic Acid, Venous 1.09 0.5 - 2.2 mmol/L  I-stat chem 8, ed  Result Value Ref Range   Sodium 142 137 - 147 mEq/L   Potassium 4.0 3.7 - 5.3 mEq/L   Chloride 103 96 - 112 mEq/L   BUN 49 (H) 6 - 23 mg/dL   Creatinine, Ser 14.20 (H) 0.50 - 1.35 mg/dL   Glucose, Bld 90 70 - 99 mg/dL   Calcium, Ion 1.13 1.12 - 1.23 mmol/L   TCO2 24 0 - 100 mmol/L   Hemoglobin 10.5 (L) 13.0 - 17.0 g/dL   HCT 31.0 (L) 39.0 - 52.0 %   Dg Chest Port 1  View  04/25/2014   CLINICAL DATA:  Shortness of breath and cough. Last dialysis Saturday.  EXAM: PORTABLE CHEST - 1 VIEW  COMPARISON:  05/06/2012  FINDINGS: There is cardiopericardial enlargement which is new from 2013. Pericardial fluid could contribute to the size, although the morphology is very similar to prior. Negative aortic and hilar contours. Bilateral airspace disease, fairly symmetric. No interlobular septal thickening. No effusion or pneumothorax.  IMPRESSION: 1. Bilateral airspace disease which could reflect alveolar edema or multi lobar pneumonia. 2. Cardiopericardial enlargement, new from 2013.   Electronically Signed   By: Jorje Guild M.D.   On: 04/25/2014 07:07   Results for orders placed or performed during the hospital encounter of 04/25/14  CBC  Result Value Ref Range   WBC 11.4 (H) 4.0 - 10.5 K/uL   RBC 3.89 (L) 4.22 - 5.81 MIL/uL   Hemoglobin 9.0 (L) 13.0 - 17.0 g/dL   HCT 28.5 (L) 39.0 - 52.0 %   MCV 73.3 (L) 78.0 - 100.0 fL   MCH 23.1 (L) 26.0 - 34.0 pg   MCHC 31.6 30.0 - 36.0 g/dL   RDW 25.9 (H) 11.5 - 15.5 %   Platelets 285 150 - 400 K/uL  Basic metabolic panel  Result Value Ref Range   Sodium 143 137 - 147 mEq/L   Potassium 4.2 3.7 - 5.3 mEq/L   Chloride 99 96 - 112 mEq/L   CO2 25 19 - 32 mEq/L   Glucose, Bld 92 70 - 99 mg/dL   BUN 52 (H) 6 - 23 mg/dL   Creatinine, Ser 13.39 (H) 0.50 - 1.35 mg/dL   Calcium 9.6 8.4 - 10.5 mg/dL   GFR calc non Af Amer 4 (L) >90 mL/min   GFR calc Af Amer 5 (L) >90 mL/min   Anion gap 19 (H) 5 - 15  Troponin I  Result Value Ref Range   Troponin I <0.30 <0.30 ng/mL  I-stat troponin, ED (not at [facility])  Result Value Ref Range   Troponin i, poc 0.03 0.00 - 0.08 ng/mL   Comment 3          I-Stat CG4 Lactic Acid, ED  Result Value Ref Range   Lactic Acid, Venous 1.09 0.5 - 2.2 mmol/L  I-stat chem 8, ed  Result Value Ref Range   Sodium 142 137 - 147 mEq/L   Potassium 4.0 3.7 - 5.3 mEq/L   Chloride 103 96 - 112 mEq/L   BUN  49 (H) 6 - 23 mg/dL   Creatinine, Ser 14.20 (H) 0.50 - 1.35 mg/dL   Glucose, Bld 90 70 - 99 mg/dL   Calcium, Ion 1.13 1.12 - 1.23 mmol/L   TCO2 24 0 - 100 mmol/L   Hemoglobin 10.5 (L) 13.0 - 17.0 g/dL   HCT 31.0 (L) 39.0 - 52.0 %   Dg Chest Port 1 View  04/25/2014   CLINICAL DATA:  Shortness of breath and cough. Last dialysis Saturday.  EXAM: PORTABLE CHEST - 1 VIEW  COMPARISON:  05/06/2012  FINDINGS: There is cardiopericardial enlargement which is new from 2013. Pericardial fluid could contribute to the size, although the morphology is very similar to prior. Negative aortic and hilar contours. Bilateral airspace disease, fairly symmetric. No interlobular septal thickening. No effusion or pneumothorax.  IMPRESSION: 1. Bilateral airspace disease which could reflect alveolar edema or multi lobar pneumonia. 2. Cardiopericardial enlargement, new from 2013.   Electronically Signed   By: Jorje Guild M.D.   On: 04/25/2014 07:07     MDM  I spoke to Internal Medicine Resident who will see pt for admission.   Pt given zosyn and vancomycin   Final diagnoses:  HCAP (healthcare-associated pneumonia)        Fransico Meadow, PA-C 04/25/14 0840  Wandra Arthurs, MD 04/25/14 [place], PA-C 05/08/14 [id number]  Wandra Arthurs, MD 05/09/14 [phone]

## 2014-04-25 NOTE — H&P (Signed)
Date: 04/25/2014               Patient Name:  [name] MRN: [id number]  DOB: 06/09/1986 Age / Sex: 28 y.o., male   PCP: No Pcp Per Patient         Medical Service: Internal Medicine Teaching Service         Attending Physician: Dr. Campbell Riches, MD    First Contact: Dr. Genene Churn Pager: [phone]  Second Contact: Dr. Ronnald Ramp Pager: [phone]       After Hours (After 5p/  First Contact Pager: [phone]  weekends / holidays): Second Contact Pager: [phone]   Chief Complaint: dyspnea and chest pain  History of Present Illness: Mr. 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 Pt states that at about 11 pm he woke up from sleep with extreme dyspnea and some left sided chest pain. The patient states the pain is sharp and worsened with the cough. The cough is productive of white and blood tinged sputum. He denies any sick contacts but works as a Presenter, broadcasting at [employer] and has small children that go to school at home. He does feel warm and have intermittent chills but no night sweats. He has had some increased fatigue and anorexia over the past 2-3 days as well. He denied any radiating chest pain, HA, syncope, LE edema, rashes, blurry vision, leg weakness, nausea/vomiting/diarrhea, or abdominal pain. He is not a smoker and denied any alcohol or drugs. He did receive his flu shot at HD and completed his last HD w/o complications or concerns. The pt only intermittently urinates and denies any pyuria with those episodes. He reports no other changes in travel, foods, or medications between this time period.   Meds: Current Facility-Administered Medications  Medication Dose Route Frequency Provider Last Rate Last Dose  . amLODipine (NORVASC) tablet 5 mg  5 mg Oral Daily Clinton Gallant, MD   5 mg at 04/25/14 0945  . ceFEPIme  (MAXIPIME) 2 g in dextrose 5 % 50 mL IVPB  2 g Intravenous Q T,Th,Sa-HD Crystal Baring, [facility]      . vancomycin (VANCOCIN) 1,250 mg in sodium chloride 0.9 % 250 mL IVPB  1,250 mg Intravenous Once Rogue Bussing, RPH 166.7 mL/hr at 04/25/14 0922 1,250 mg at 04/25/14 6237  . vancomycin (VANCOCIN) IVPB 750 mg/150 ml premix  750 mg Intravenous Q T,Th,Sa-HD Crystal Avanell Shackleton, [facility]       Current Outpatient Prescriptions  Medication Sig Dispense Refill  . acetaminophen (TYLENOL) 500 MG tablet Take 1,000 mg by mouth every 6 (six) hours as needed.    Marland Kitchen amLODipine (NORVASC) 10 MG tablet Take 5 mg by mouth daily.     . calcitRIOL (ROCALTROL) 0.25 MCG capsule Take 0.5 mcg by mouth daily.     . cinacalcet (SENSIPAR) 30 MG tablet Take 30 mg by mouth daily.    Marland Kitchen docusate sodium (COLACE) 100 MG capsule Take 100 mg by mouth 2 (two) times daily.     Marland Kitchen epoetin alfa (EPOGEN,PROCRIT) 2000 UNIT/ML injection Inject 4,000 Units into the skin 3 (three) times a week.    . sevelamer carbonate (RENVELA) 800 MG tablet Take 3,200 mg by mouth 3 (three) times daily.     . vitamin A 10000 UNIT capsule Take 7,500 Units by mouth daily.  Pt is unsure of this dose    . acetaminophen (TYLENOL) 325 MG tablet Take 325 mg by mouth daily as needed.    . cephALEXin (KEFLEX) 500 MG capsule 2 caps po bid x 7 days 28 capsule 0  . cloNIDine (CATAPRES) 0.1 MG tablet Take 0.1 mg by mouth daily.    Marland Kitchen diltiazem (CARDIZEM CD) 120 MG 24 hr capsule Take 120 mg by mouth daily.    . diphenhydrAMINE (SOMINEX) 25 MG tablet Take 25 mg by mouth at bedtime as needed for itching or sleep.    Marland Kitchen omeprazole (PRILOSEC) 20 MG capsule Take 20 mg by mouth daily as needed (for acid reflux).       Allergies: Allergies as of 04/25/2014  . (No Known Allergies)   Past Medical History  Diagnosis Date  . Alport syndrome   . GERD (gastroesophageal reflux disease)   . Kidney failure as a baby    went on dialysis age 75  . GERD  (gastroesophageal reflux disease)    Past Surgical History  Procedure Laterality Date  . Kidney transplant    . Kidney transplant    . Nephrectomy transplanted organ    . 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 Cancer Mother    History   Social History  . Marital Status: Single    Spouse Name: N/A    Number of Children: N/A  . Years of Education: N/A   Occupational History  . Not on file.   Social History Main Topics  . Smoking status: Former Smoker    Types: Cigarettes    Quit date: 12/04/2012  . Smokeless tobacco: Not on file  . Alcohol Use: No  . Drug Use: No  . Sexual Activity: Not on file   Other Topics Concern  . Not on file   Social History Narrative    Review of Systems: Pertinent items are noted in HPI.  Physical Exam: Blood pressure 156/97, pulse 125, temperature 100.8 F (38.2 C), temperature source Rectal, resp. rate 41, SpO2 94 %. General: resting in bed, uncomfortable, tachypneic  HEENT: PERRL, EOMI, no scleral icterus Cardiac: tachy, RR, no rubs, murmurs or gallops Pulm: decreased BS in right lung base, expiratory wheezes throughout, no rhonchi, moving decreaed volumes of air Abd: soft, nontender, nondistended, BS present, large RLQ well healed scar w/o erythema but slight ttp with deep palpation Ext: warm and well perfused, no pedal edema, no rashes or lesions, left forearm AV fistula good palpable thrill and bruit heard Neuro: alert and oriented X3, cranial nerves II-XII grossly intact   Lab results: Basic Metabolic Panel:  Recent Labs  04/25/14 0655 04/25/14 0732  NA 143 142  K 4.2 4.0  CL 99 103  CO2 25  --   GLUCOSE 92 90  BUN 52* 49*  CREATININE 13.39* 14.20*  CALCIUM 9.6  --    CBC:  Recent Labs  04/25/14 0655 04/25/14 0732  WBC 11.4*  --   HGB 9.0* 10.5*  HCT 28.5* 31.0*  MCV 73.3*  --   PLT 285  --    Cardiac Enzymes:  Recent Labs  04/25/14 0655    TROPONINI <0.30   Imaging results:  Dg Chest Port 1 View  04/25/2014   CLINICAL DATA:  Shortness of breath and cough. Last dialysis Saturday.  EXAM: PORTABLE CHEST - 1 VIEW  COMPARISON:  05/06/2012  FINDINGS: There is cardiopericardial enlargement which is new from 2013. Pericardial  fluid could contribute to the size, although the morphology is very similar to prior. Negative aortic and hilar contours. Bilateral airspace disease, fairly symmetric. No interlobular septal thickening. No effusion or pneumothorax.  IMPRESSION: 1. Bilateral airspace disease which could reflect alveolar edema or multi lobar pneumonia. 2. Cardiopericardial enlargement, new from 2013.   Electronically Signed   By: Jorje Guild M.D.   On: 04/25/2014 07:07    Other results: EKG: unchanged from previous tracings, sinus tachycardia, some J point elevations likely early repolarization.  Assessment & Plan by Problem: Mr. Couey 28 yo M pmh alport syndrome with ESRD on HD and HTN p/w dyspnea.   1. Dyspnea in setting of Pulmonary edema and possible PNA: pt is tachypneic and in some respiratory distress on exam. originally desats in low 80s on RA and that responded to O2, CXR likely fluid overload but some concern for PNA given pt symptoms of cough and fever/chills. Pt was empirically treated in ED with HCAP coverage of Vanc/zosyn. DDx is wide but leans more towards pulmonary edema as an etiology. Cardiac etiology seems less likely initial troponin was negative and EKG unchanged besides tachycardia from previous. Unlikely aortic dissection although pt is at increased risk with Alport syndrome and age.  -renal consulted for HD  -stat ABG as tachypnea wasn't resolving -BCx pending  -repeat CXR after HD -cont trops x2 with repeat EKG in AM -changed Abx to cefepime and vanc for HCAP may consider D/c after repeat assessment made post HD  2. Alport syndrome with ESRD: pt has had rejected transplant removed in 11/2014 at [facility] and has since been weaned off of all immunosuppressant therapy.  -renal consulted and greatly appreciated  3. HTN: pt has HTN at baseline and didn't take meds this AM. This is also likely elevated in the setting of pulmonary edema.  -cont amlodipine -HD today  Dispo: Disposition is deferred at this time, awaiting improvement of current medical problems. Anticipated discharge in approximately 2-3 day(s).   The patient does not have a current PCP (No Pcp Per Patient) and does need an [facility] hospital follow-up appointment after discharge.  The patient does not have transportation limitations that hinder transportation to clinic appointments.  Signed: Clinton Gallant, MD 04/25/2014, 9:52 AM

## 2014-04-26 ENCOUNTER — Inpatient Hospital Stay (HOSPITAL_COMMUNITY): Payer: Medicare Other

## 2014-04-26 DIAGNOSIS — E8779 Other fluid overload: Secondary | ICD-10-CM

## 2014-04-26 DIAGNOSIS — J189 Pneumonia, unspecified organism: Secondary | ICD-10-CM

## 2014-04-26 DIAGNOSIS — D649 Anemia, unspecified: Secondary | ICD-10-CM

## 2014-04-26 DIAGNOSIS — I319 Disease of pericardium, unspecified: Secondary | ICD-10-CM

## 2014-04-26 DIAGNOSIS — Z992 Dependence on renal dialysis: Secondary | ICD-10-CM

## 2014-04-26 LAB — CBC WITH DIFFERENTIAL/PLATELET
BASOS ABS: 0.1 10*3/uL (ref 0.0–0.1)
Basophils Relative: 1 % (ref 0–1)
EOS ABS: 0.8 10*3/uL — AB (ref 0.0–0.7)
Eosinophils Relative: 8 % — ABNORMAL HIGH (ref 0–5)
HCT: 31.3 % — ABNORMAL LOW (ref 39.0–52.0)
Hemoglobin: 9.9 g/dL — ABNORMAL LOW (ref 13.0–17.0)
Lymphocytes Relative: 17 % (ref 12–46)
Lymphs Abs: 1.7 10*3/uL (ref 0.7–4.0)
MCH: 22.3 pg — ABNORMAL LOW (ref 26.0–34.0)
MCHC: 31.6 g/dL (ref 30.0–36.0)
MCV: 70.7 fL — AB (ref 78.0–100.0)
MONO ABS: 0.9 10*3/uL (ref 0.1–1.0)
MONOS PCT: 9 % (ref 3–12)
NEUTROS ABS: 6.3 10*3/uL (ref 1.7–7.7)
Neutrophils Relative %: 65 % (ref 43–77)
PLATELETS: 277 10*3/uL (ref 150–400)
RBC: 4.43 MIL/uL (ref 4.22–5.81)
RDW: 25.7 % — AB (ref 11.5–15.5)
WBC: 9.8 10*3/uL (ref 4.0–10.5)

## 2014-04-26 LAB — BASIC METABOLIC PANEL
ANION GAP: 15 (ref 5–15)
BUN: 27 mg/dL — AB (ref 6–23)
CALCIUM: 9.8 mg/dL (ref 8.4–10.5)
CHLORIDE: 96 meq/L (ref 96–112)
CO2: 28 mEq/L (ref 19–32)
CREATININE: 8.22 mg/dL — AB (ref 0.50–1.35)
GFR calc Af Amer: 9 mL/min — ABNORMAL LOW (ref >90)
GFR, EST NON AFRICAN AMERICAN: 8 mL/min — AB (ref >90)
Glucose, Bld: 87 mg/dL (ref 70–99)
Potassium: 4.3 mEq/L (ref 3.7–5.3)
Sodium: 139 mEq/L (ref 137–147)

## 2014-04-26 LAB — RESPIRATORY VIRUS PANEL
ADENOVIRUS: NOT DETECTED
Influenza A H1: NOT DETECTED
Influenza A H3: NOT DETECTED
Influenza A: NOT DETECTED
Influenza B: NOT DETECTED
METAPNEUMOVIRUS: NOT DETECTED
Parainfluenza 1: NOT DETECTED
Parainfluenza 2: NOT DETECTED
Parainfluenza 3: NOT DETECTED
RESPIRATORY SYNCYTIAL VIRUS A: NOT DETECTED
RESPIRATORY SYNCYTIAL VIRUS B: NOT DETECTED
Rhinovirus: NOT DETECTED

## 2014-04-26 LAB — HIV ANTIBODY (ROUTINE TESTING W REFLEX): HIV: NONREACTIVE

## 2014-04-26 MED ORDER — DILTIAZEM HCL ER COATED BEADS 120 MG PO CP24
120.0000 mg | ORAL_CAPSULE | Freq: Every day | ORAL | Status: DC
  Administered 2014-04-26 – 2014-04-27 (×2): 120 mg via ORAL
  Filled 2014-04-26 (×2): qty 1

## 2014-04-26 MED ORDER — SEVELAMER CARBONATE 800 MG PO TABS
2400.0000 mg | ORAL_TABLET | Freq: Three times a day (TID) | ORAL | Status: DC
  Administered 2014-04-26 – 2014-04-27 (×3): 2400 mg via ORAL
  Filled 2014-04-26 (×6): qty 3

## 2014-04-26 NOTE — Progress Notes (Signed)
Subjective: Patient states that he feels much better today. He had a migraine headache overnight which was treated successfully. He denies any cp, shob. He reports some mild throat irritation which he likely attributes to coughing.   Objective: Vital signs in last 24 hours: Filed Vitals:   04/26/14 0005 04/26/14 0329 04/26/14 0740 04/26/14 1135  BP: 148/107  145/101 146/97  Pulse: 104 104 109 91  Temp: 98.7 F (37.1 C) 98.6 F (37 C) 98.5 F (36.9 C) 98.4 F (36.9 C)  TempSrc: Oral Oral Oral Oral  Resp: $Remo'22  20 22  'PtkNS$ Height:      Weight:      SpO2: 98% 96% 100% 97%   Weight change:   Intake/Output Summary (Last 24 hours) at 04/26/14 1148 Last data filed at 04/25/14 2100  Gross per 24 hour  Intake    440 ml  Output   3700 ml  Net  -3260 ml   BP 146/97 mmHg  Pulse 91  Temp(Src) 98.4 F (36.9 C) (Oral)  Resp 22  Ht $R'5\' 9"'xD$  (1.753 m)  Wt 59.4 kg (130 lb 15.3 oz)  BMI 19.33 kg/m2  SpO2 97%  General Appearance:    Alert, cooperative, no distress, appears stated age  Head:    Normocephalic, without obvious abnormality, atraumatic  Eyes:    PERRL, conjunctiva/corneas clear, EOM's intact, fundi    benign, both eyes       Ears:    Normal TM's and external ear canals, both ears  Nose:   Nares normal, septum midline, mucosa normal, no drainage    or sinus tenderness  Throat:   Lips, mucosa, and tongue normal; teeth and gums normal  Neck:   Supple, symmetrical, trachea midline, no adenopathy;       thyroid:  No enlargement/tenderness/nodules; no carotid   bruit or JVD  Back:     Symmetric, no curvature, ROM normal, no CVA tenderness  Lungs:     Clear to auscultation bilaterally, respirations unlabored  Chest wall:    No tenderness or deformity  Heart:    Regular rate and rhythm, S1 and S2 normal, no murmur, rub   or gallop  Abdomen:     Soft, non-tender, bowel sounds active all four quadrants,    no masses, no organomegaly        Extremities:   Extremities normal,  atraumatic, no cyanosis or edema  Pulses:   2+ and symmetric all extremities  Skin:   Skin color, texture, turgor normal, no rashes or lesions  Lymph nodes:   Cervical, supraclavicular, and axillary nodes normal  Neurologic:   CNII-XII intact. Normal strength, sensation and reflexes      throughout   Lab Results:  Basic Metabolic Panel:  Recent Labs Lab 04/25/14 0655 04/25/14 0732 04/25/14 1645 04/26/14 0347  NA 143 142  --  139  K 4.2 4.0  --  4.3  CL 99 103  --  96  CO2 25  --   --  28  GLUCOSE 92 90  --  87  BUN 52* 49*  --  27*  CREATININE 13.39* 14.20*  --  8.22*  CALCIUM 9.6  --   --  9.8  PHOS  --   --  2.7  --    CBC:  Recent Labs Lab 04/25/14 0655 04/25/14 0732 04/26/14 0347  WBC 11.4*  --  9.8  NEUTROABS  --   --  6.3  HGB 9.0* 10.5* 9.9*  HCT 28.5* 31.0* 31.3*  MCV 73.3*  --  70.7*  PLT 285  --  277   Cardiac Enzymes:  Recent Labs Lab 04/25/14 0655 04/25/14 1645 04/25/14 2015  TROPONINI <0.30 <0.30 <0.30    Micro Results: Recent Results (from the past 240 hour(s))  Blood culture (routine x 2)     Status: None (Preliminary result)   Collection Time: 04/25/14  8:19 AM  Result Value Ref Range Status   Specimen Description BLOOD RIGHT ANTECUBITAL  Final   Special Requests BOTTLES DRAWN AEROBIC AND ANAEROBIC 5MLS  Final   Culture  Setup Time   Final    04/25/2014 13:55 Performed at [facility]    Culture   Final           BLOOD CULTURE RECEIVED NO GROWTH TO DATE CULTURE WILL BE HELD FOR 5 DAYS BEFORE ISSUING A FINAL NEGATIVE REPORT Performed at [facility]    Report Status PENDING  Incomplete  Blood culture (routine x 2)     Status: None (Preliminary result)   Collection Time: 04/25/14  8:19 AM  Result Value Ref Range Status   Specimen Description BLOOD RIGHT HAND  Final   Special Requests BOTTLES DRAWN AEROBIC AND ANAEROBIC 5MLS  Final   Culture  Setup Time   Final    04/25/2014 13:56 Performed at [facility]    Culture   Final           BLOOD CULTURE RECEIVED NO GROWTH TO DATE CULTURE WILL BE HELD FOR 5 DAYS BEFORE ISSUING A FINAL NEGATIVE REPORT Performed at [facility]    Report Status PENDING  Incomplete  MRSA PCR Screening     Status: None   Collection Time: 04/25/14  4:27 PM  Result Value Ref Range Status   MRSA by PCR NEGATIVE NEGATIVE Final    Comment:        The GeneXpert MRSA Assay (FDA approved for NASAL specimens only), is one component of a comprehensive MRSA colonization surveillance program. It is not intended to diagnose MRSA infection nor to guide or monitor treatment for MRSA infections.    Studies/Results: Dg Chest 2 View  04/26/2014   CLINICAL DATA:  Healthcare associated pneumonia  EXAM: CHEST  2 VIEW  COMPARISON:  04/25/2014  FINDINGS: Cardiomediastinal silhouette is stable. There is improvement in aeration. Residual mild interstitial prominence right perihilar region probable residual edema or pneumonitis. No confluent infiltrates. No pleural effusion.  IMPRESSION: Improvement in aeration. Mild interstitial prominence right perihilar and infrahilar region probable residual edema or pneumonitis. No confluent infiltrates.   Electronically Signed   By: Lahoma Crocker M.D.   On: 04/26/2014 08:10   Dg Chest Port 1 View  04/25/2014   CLINICAL DATA:  Shortness of breath and cough. Last dialysis Saturday.  EXAM: PORTABLE CHEST - 1 VIEW  COMPARISON:  05/06/2012  FINDINGS: There is cardiopericardial enlargement which is new from 2013. Pericardial fluid could contribute to the size, although the morphology is very similar to prior. Negative aortic and hilar contours. Bilateral airspace disease, fairly symmetric. No interlobular septal thickening. No effusion or pneumothorax.  IMPRESSION: 1. Bilateral airspace disease which could reflect alveolar edema or multi lobar pneumonia. 2. Cardiopericardial enlargement, new from 2013.   Electronically Signed   By: Jorje Guild M.D.   On: 04/25/2014 07:07   Medications: I have reviewed the patient's current medications.  Scheduled Meds: . amLODipine  10 mg Oral Daily  . calcitRIOL  1.25 mcg Oral Q T,Th,Sa-HD  .  cinacalcet  30 mg Oral Q breakfast  . darbepoetin (ARANESP) injection - DIALYSIS  200 mcg Intravenous Q Tue-HD  . docusate sodium  100 mg Oral BID  . heparin  5,000 Units Subcutaneous 3 times per day  . multivitamin  1 tablet Oral QHS  . pantoprazole  40 mg Oral Daily  . sevelamer carbonate  2,400 mg Oral TID WC  . sodium chloride  3 mL Intravenous Q12H   PRN Meds:.sodium chloride, aspirin-acetaminophen-caffeine, hydrALAZINE, pneumococcal 23 valent vaccine, sodium chloride   Assessment/Plan: Principal Problem:   Acute dyspnea Active Problems:   HCAP (healthcare-associated pneumonia)   Alport syndrome   Essential (primary) hypertension   H/O kidney transplant   Flash pulmonary edema  [name] is a 27 y.o. M w/ PMHx of Alport syndrome, ESRD on HD (THS) and HTN. Originally p/w new onset cp, shob, and cough likely 2/2 volume overload.  1. Pulmonary edema - was likely d/t volume overload. Pt is much improved today. Unlabored breathing at 100% O2 sat on RA.  - To assess possible cardiologic nature of the pulmonary edema, an ECHO is pending, especially in the setting of suggested LVH on EKG. - Pt will also undergo another round of HD today  - Abx d/c'ed as PNA is unlikely. WBC is down today  2. HTN - BP better today not that he was restarted on his amlodipine.  - Has order in for prn Hydralazine if BP > 160/110.  3. Alport syndrome with ESRD - pt being followed at [facility] for transplant rejection  Dispo: Patient to be transferred to the floor today. Anticipated discharge in approximately 1-2 day(s).   The patient does not have a current PCP. The patient does need an [facility] hospital follow-up appointment after discharge.  The patient does not have transportation limitations that hinder  transportation to clinic appointments.  .Services Needed at time of discharge: Y = Yes, Blank = No PT:   OT:   RN:   Equipment:   Other:     LOS: 1 day   Farrell Ours, Med Student 04/26/2014, 11:48 AM

## 2014-04-26 NOTE — Plan of Care (Signed)
Problem: Phase I Progression Outcomes Goal: O2 sats > or equal 90% or at baseline Outcome: Completed/Met Date Met:  04/26/14 Goal: Dyspnea controlled at rest Outcome: Completed/Met Date Met:  04/26/14

## 2014-04-26 NOTE — Progress Notes (Signed)
  Date: 04/26/2014  Patient name: [name]  Medical record number: [id number]  Date of birth: 16-Jul-1985   This patient has been seen and the plan of care was discussed with the house staff. Please see their note for complete details. I concur with their findings with the following additions/corrections:  He feels better. No SOB on RA.   Blood pressure 145/101, pulse 109, temperature 98.5 F (36.9 C), temperature source Oral, resp. rate 20, height $RemoveBe'5\' 9"'OmtQRPGJw$  (1.753 m), weight 59.4 kg (130 lb 15.3 oz), SpO2 100 %. cv- rrr Chest- cta abd- bs+, soft, non-tender extr- no edema.   Labs-  CXR: Improvement in aeration. Mild interstitial prominence right perihilar and infrahilar region probable residual edema or pneumonitis. No confluent infiltrates.  Troponin (-)  A/P Alport's ESRD Fluid overload  Will get repeat HD today.  Will f/u his previous TTE at [facility] and assess need for repeat.  Out of 2h today to regular floor.   John Riches, MD 04/26/2014, 10:02 AM

## 2014-04-26 NOTE — Progress Notes (Signed)
Echo Lab  2D Echocardiogram completed.  [place], RDCS 04/26/2014 10:39 AM

## 2014-04-26 NOTE — Progress Notes (Signed)
S: Feels much better and breathing much better.  No cough O:BP 145/101 mmHg  Pulse 109  Temp(Src) 98.5 F (36.9 C) (Oral)  Resp 20  Ht $R'5\' 9"'vW$  (1.753 m)  Wt 59.4 kg (130 lb 15.3 oz)  BMI 19.33 kg/m2  SpO2 100%  Intake/Output Summary (Last 24 hours) at 04/26/14 3704 Last data filed at 04/25/14 2100  Gross per 24 hour  Intake    440 ml  Output   3700 ml  Net  -3260 ml   Weight change:  UGQ:BVQXI and alert CVS:Sl tachy, reg Resp:clear Abd:+ BS NTND Ext:no edema  Lt AVF + bruit NEURO:CNI Ox3 no asterixis   . amLODipine  10 mg Oral Daily  . calcitRIOL  1.25 mcg Oral Q T,Th,Sa-HD  . cinacalcet  30 mg Oral Q breakfast  . darbepoetin (ARANESP) injection - DIALYSIS  200 mcg Intravenous Q Tue-HD  . docusate sodium  100 mg Oral BID  . heparin  5,000 Units Subcutaneous 3 times per day  . multivitamin  1 tablet Oral QHS  . pantoprazole  40 mg Oral Daily  . sevelamer carbonate  3,200 mg Oral TID WC  . sodium chloride  3 mL Intravenous Q12H   Dg Chest Port 1 View  04/25/2014   CLINICAL DATA:  Shortness of breath and cough. Last dialysis Saturday.  EXAM: PORTABLE CHEST - 1 VIEW  COMPARISON:  05/06/2012  FINDINGS: There is cardiopericardial enlargement which is new from 2013. Pericardial fluid could contribute to the size, although the morphology is very similar to prior. Negative aortic and hilar contours. Bilateral airspace disease, fairly symmetric. No interlobular septal thickening. No effusion or pneumothorax.  IMPRESSION: 1. Bilateral airspace disease which could reflect alveolar edema or multi lobar pneumonia. 2. Cardiopericardial enlargement, new from 2013.   Electronically Signed   By: Jorje Guild M.D.   On: 04/25/2014 07:07   BMET    Component Value Date/Time   NA 139 04/26/2014 0347   K 4.3 04/26/2014 0347   CL 96 04/26/2014 0347   CO2 28 04/26/2014 0347   GLUCOSE 87 04/26/2014 0347   BUN 27* 04/26/2014 0347   CREATININE 8.22* 04/26/2014 0347   CALCIUM 9.8 04/26/2014  0347   GFRNONAA 8* 04/26/2014 0347   GFRAA 9* 04/26/2014 0347   CBC    Component Value Date/Time   WBC 9.8 04/26/2014 0347   RBC 4.43 04/26/2014 0347   HGB 9.9* 04/26/2014 0347   HCT 31.3* 04/26/2014 0347   PLT 277 04/26/2014 0347   MCV 70.7* 04/26/2014 0347   MCH 22.3* 04/26/2014 0347   MCHC 31.6 04/26/2014 0347   RDW 25.7* 04/26/2014 0347   LYMPHSABS 1.7 04/26/2014 0347   MONOABS 0.9 04/26/2014 0347   EOSABS 0.8* 04/26/2014 0347   BASOSABS 0.1 04/26/2014 0347     Assessment: 1. Resp distress, CXR shows improvement which suggest more pulm edema than PNA. 2. HTN 3. 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 Anemia on aranesp 5. ESRD Plan: 1.  Not clear why he went into pulm edema.  Says fluid intake was not excessive and DW had been decreased following recent nephrectomy.  Will plan HD in AM and try to lower DW further. 2.  Consider getting an Echo to assess EF and if any diastolic dysfunction to explain pulm edema 3. Could transfer out of unit   [place] T

## 2014-04-26 NOTE — Progress Notes (Signed)
Subjective:  Doing well. No sob, cp/n/v. Will go for HD today again. Will go for ECHO too. No dysuria. Has low urine output at baseline.  Objective: Vital signs in last 24 hours: Filed Vitals:   04/26/14 0329 04/26/14 0740 04/26/14 1135 04/26/14 1338  BP:  145/101 146/97 146/95  Pulse: 104 109 91 98  Temp: 98.6 F (37 C) 98.5 F (36.9 C) 98.4 F (36.9 C) 98 F (36.7 C)  TempSrc: Oral Oral Oral Oral  Resp:  $Remo'20 22 20  'DRYck$ Height:      Weight:      SpO2: 96% 100% 97% 100%   Weight change:   Intake/Output Summary (Last 24 hours) at 04/26/14 1411 Last data filed at 04/25/14 2100  Gross per 24 hour  Intake    440 ml  Output   3700 ml  Net  -3260 ml   Vitals reviewed. General: resting in bed, NAD HEENT: PERRL, EOMI, no scleral icterus Cardiac: RRR, no rubs, murmurs or gallops Pulm: clear to auscultation bilaterally, no wheezes, rales, or rhonchi Abd: soft, nontender, nondistended, BS present. Well healing RLQ surgical site. Ext: warm and well perfused, no pedal edema Neuro: alert and oriented X3, cranial nerves II-XII grossly intact, strength and sensation to light touch equal in bilateral upper and lower extremities  Lab Results: Basic Metabolic Panel:  Recent Labs Lab 04/25/14 0655 04/25/14 0732 04/25/14 1645 04/26/14 0347  NA 143 142  --  139  K 4.2 4.0  --  4.3  CL 99 103  --  96  CO2 25  --   --  28  GLUCOSE 92 90  --  87  BUN 52* 49*  --  27*  CREATININE 13.39* 14.20*  --  8.22*  CALCIUM 9.6  --   --  9.8  PHOS  --   --  2.7  --    Liver Function Tests: No results for input(s): AST, ALT, ALKPHOS, BILITOT, PROT, ALBUMIN in the last 168 hours. No results for input(s): LIPASE, AMYLASE in the last 168 hours. No results for input(s): AMMONIA in the last 168 hours. CBC:  Recent Labs Lab 04/25/14 0655 04/25/14 0732 04/26/14 0347  WBC 11.4*  --  9.8  NEUTROABS  --   --  6.3  HGB 9.0* 10.5* 9.9*  HCT 28.5* 31.0* 31.3*  MCV 73.3*  --  70.7*  PLT 285  --   277   Cardiac Enzymes:  Recent Labs Lab 04/25/14 0655 04/25/14 1645 04/25/14 2015  TROPONINI <0.30 <0.30 <0.30   BNP: No results for input(s): PROBNP in the last 168 hours. D-Dimer: No results for input(s): DDIMER in the last 168 hours. CBG: No results for input(s): GLUCAP in the last 168 hours. Hemoglobin A1C: No results for input(s): HGBA1C in the last 168 hours. Fasting Lipid Panel: No results for input(s): CHOL, HDL, LDLCALC, TRIG, CHOLHDL, LDLDIRECT in the last 168 hours. Thyroid Function Tests: No results for input(s): TSH, T4TOTAL, FREET4, T3FREE, THYROIDAB in the last 168 hours. Coagulation: No results for input(s): LABPROT, INR in the last 168 hours. Anemia Panel: No results for input(s): VITAMINB12, FOLATE, FERRITIN, TIBC, IRON, RETICCTPCT in the last 168 hours. Urine Drug Screen: Drugs of Abuse  No results found for: LABOPIA, COCAINSCRNUR, LABBENZ, AMPHETMU, THCU, LABBARB  Alcohol Level: No results for input(s): ETH in the last 168 hours. Urinalysis: No results for input(s): COLORURINE, LABSPEC, PHURINE, GLUCOSEU, HGBUR, BILIRUBINUR, KETONESUR, PROTEINUR, UROBILINOGEN, NITRITE, LEUKOCYTESUR in the last 168 hours.  Invalid input(s): APPERANCEUR  Misc. Labs:  Micro Results: Recent Results (from the past 240 hour(s))  Blood culture (routine x 2)     Status: None (Preliminary result)   Collection Time: 04/25/14  8:19 AM  Result Value Ref Range Status   Specimen Description BLOOD RIGHT ANTECUBITAL  Final   Special Requests BOTTLES DRAWN AEROBIC AND ANAEROBIC 5MLS  Final   Culture  Setup Time   Final    04/25/2014 13:55 Performed at [facility]    Culture   Final           BLOOD CULTURE RECEIVED NO GROWTH TO DATE CULTURE WILL BE HELD FOR 5 DAYS BEFORE ISSUING A FINAL NEGATIVE REPORT Performed at [facility]    Report Status PENDING  Incomplete  Blood culture (routine x 2)     Status: None (Preliminary result)   Collection Time: 04/25/14   8:19 AM  Result Value Ref Range Status   Specimen Description BLOOD RIGHT HAND  Final   Special Requests BOTTLES DRAWN AEROBIC AND ANAEROBIC 5MLS  Final   Culture  Setup Time   Final    04/25/2014 13:56 Performed at [facility]    Culture   Final           BLOOD CULTURE RECEIVED NO GROWTH TO DATE CULTURE WILL BE HELD FOR 5 DAYS BEFORE ISSUING A FINAL NEGATIVE REPORT Performed at [facility]    Report Status PENDING  Incomplete  MRSA PCR Screening     Status: None   Collection Time: 04/25/14  4:27 PM  Result Value Ref Range Status   MRSA by PCR NEGATIVE NEGATIVE Final    Comment:        The GeneXpert MRSA Assay (FDA approved for NASAL specimens only), is one component of a comprehensive MRSA colonization surveillance program. It is not intended to diagnose MRSA infection nor to guide or monitor treatment for MRSA infections.    Studies/Results: Dg Chest 2 View  04/26/2014   CLINICAL DATA:  Healthcare associated pneumonia  EXAM: CHEST  2 VIEW  COMPARISON:  04/25/2014  FINDINGS: Cardiomediastinal silhouette is stable. There is improvement in aeration. Residual mild interstitial prominence right perihilar region probable residual edema or pneumonitis. No confluent infiltrates. No pleural effusion.  IMPRESSION: Improvement in aeration. Mild interstitial prominence right perihilar and infrahilar region probable residual edema or pneumonitis. No confluent infiltrates.   Electronically Signed   By: Lahoma Crocker M.D.   On: 04/26/2014 08:10   Dg Chest Port 1 View  04/25/2014   CLINICAL DATA:  Shortness of breath and cough. Last dialysis Saturday.  EXAM: PORTABLE CHEST - 1 VIEW  COMPARISON:  05/06/2012  FINDINGS: There is cardiopericardial enlargement which is new from 2013. Pericardial fluid could contribute to the size, although the morphology is very similar to prior. Negative aortic and hilar contours. Bilateral airspace disease, fairly symmetric. No interlobular septal  thickening. No effusion or pneumothorax.  IMPRESSION: 1. Bilateral airspace disease which could reflect alveolar edema or multi lobar pneumonia. 2. Cardiopericardial enlargement, new from 2013.   Electronically Signed   By: Jorje Guild M.D.   On: 04/25/2014 07:07   Medications: I have reviewed the patient's current medications. Scheduled Meds: . amLODipine  10 mg Oral Daily  . calcitRIOL  1.25 mcg Oral Q T,Th,Sa-HD  . cinacalcet  30 mg Oral Q breakfast  . darbepoetin (ARANESP) injection - DIALYSIS  200 mcg Intravenous Q Tue-HD  . docusate sodium  100 mg Oral BID  . heparin  5,000 Units Subcutaneous 3 times per day  . multivitamin  1 tablet Oral QHS  . pantoprazole  40 mg Oral Daily  . sevelamer carbonate  2,400 mg Oral TID WC  . 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  Acute resp distress - 2/2 Volume overload - with pulmonary edema. Improved. unclear cause. Could be from ESRD but did receive his scheduled HD without any problem. Didn't have problem like this usually in the past. No recent changes with medications or diet. We need to evaluate for CHF as the cause of volume overload. Patient has LVH likely 2/2 to chronic HTN evidence on EKG.   In the ED he received HCAP coverage but infection is very unlikely as CXR is more indicative of flash pulm edema and patient improved significantly with HD. No abx.  - CXR shows improvement of volume status after dialysis. Still has some vascular congestion/edema. He is  feeling much better after yesterday's HD.  - will get HD again today per nephro. Appreciate recs. - get ECHO. Couldn't find one in Care Everywhere.  ESRD on T,Th,Sat dialysis - 2/2 to Alport's syndrome. - got dialyzed last 04/22/14. Usually gets HD without any complication. Had kidney tx on 2009. Had failure and had transplant taken out Oct 2015, was having hematuria that time. Got 1unit transfusion 2 weeks ago.   - with sec HPTH. cont calcitriol and sensipar. - HD today. Cont outpatient HD once discharge.   Anemia - likely 2/2 to CKD - cont aranesp. Con  HTN - in 140's. - takes amlodopine 5mg  daily, clonidine 0.1mg  daily, and dilt 120mg  24hr cap daily - will restart dilt 120mg  24hr and amlodopine. Will hold off clonidine and restart if needed.  Dispo: Disposition is deferred at this time, awaiting improvement of current medical problems.  Anticipated discharge in approximately 1-2 day(s).   The patient does have a current PCP (No Pcp Per Patient) and does need an [facility] hospital follow-up appointment after discharge.  The patient does have transportation limitations that hinder transportation to clinic appointments.  .Services Needed at time of discharge: Y = Yes, Blank = No PT:   OT:   RN:   Equipment:   Other:     LOS: 1 day   Dellia Nims, MD 04/26/2014, 2:11 PM

## 2014-04-26 NOTE — Progress Notes (Signed)
Pt transferred to room 5W10 via wheelchair.. Pt is alert and oriented x4, able to follow commands, denies any pain at this time. VSS, no s/s of respiratory distress on room air. Standard hospital orientation completed, bed in lowest position, call bell is within reach. Will continue to monitor pt closely. Report received from [facility].

## 2014-04-26 NOTE — Plan of Care (Signed)
Problem: ICU Phase Progression Outcomes Goal: Dyspnea controlled at rest Outcome: Progressing     

## 2014-04-27 DIAGNOSIS — I5020 Unspecified systolic (congestive) heart failure: Secondary | ICD-10-CM

## 2014-04-27 LAB — BASIC METABOLIC PANEL
ANION GAP: 19 — AB (ref 5–15)
BUN: 44 mg/dL — ABNORMAL HIGH (ref 6–23)
CHLORIDE: 97 meq/L (ref 96–112)
CO2: 25 mEq/L (ref 19–32)
Calcium: 9.4 mg/dL (ref 8.4–10.5)
Creatinine, Ser: 11.74 mg/dL — ABNORMAL HIGH (ref 0.50–1.35)
GFR calc non Af Amer: 5 mL/min — ABNORMAL LOW (ref >90)
GFR, EST AFRICAN AMERICAN: 6 mL/min — AB (ref >90)
Glucose, Bld: 69 mg/dL — ABNORMAL LOW (ref 70–99)
POTASSIUM: 4.4 meq/L (ref 3.7–5.3)
Sodium: 141 mEq/L (ref 137–147)

## 2014-04-27 MED ORDER — RENA-VITE PO TABS: 1.0000 | ORAL_TABLET | Freq: Every day | ORAL | Status: DC

## 2014-04-27 MED ORDER — LISINOPRIL 40 MG PO TABS: 40.0000 mg | ORAL_TABLET | Freq: Every day | ORAL | Status: DC

## 2014-04-27 MED ORDER — CARVEDILOL 6.25 MG PO TABS: 6.2500 mg | ORAL_TABLET | Freq: Two times a day (BID) | ORAL | Status: DC

## 2014-04-27 MED ORDER — ASPIRIN EC 81 MG PO TBEC: 81.0000 mg | DELAYED_RELEASE_TABLET | Freq: Every day | ORAL | Status: DC

## 2014-04-27 MED ORDER — ATORVASTATIN CALCIUM 20 MG PO TABS: 20.0000 mg | ORAL_TABLET | Freq: Every day | ORAL | Status: DC

## 2014-04-27 NOTE — Discharge Instructions (Addendum)
It was a pleasure taking care of you. You were admitted for respiratory distress likely from volume overload. You are doing well after dialysis. You should continue your same outpatient dialysis schedule. We have made some changes to your medications to help with your Congestive Heart Failure. Please make sure you follow up with your PCP within next week.

## 2014-04-27 NOTE — Procedures (Addendum)
Pt seen on HD.  BFR 400  Ap 140 Vp 180.  Will use his post HD weight as his new DW.  He is not requiring O2 and lungs are clear.  He can be DC'd after HD from my standpoint.  Not sure I understand addition of cardizem to amlodipine.  I think carvedilol would be more appropriate.

## 2014-04-27 NOTE — Progress Notes (Signed)
  Date: 04/27/2014  Patient name: [name]  Medical record number: [id number]  Date of birth: May 14, 1986   This patient has been seen and the plan of care was discussed with the house staff. Please see their note for complete details. I concur with their findings with the following additions/corrections:  Pt feels well, seen in HD.   Blood pressure 142/90, pulse 119, temperature 98.9 F (37.2 C), temperature source Oral, resp. rate 20, height $RemoveBe'5\' 9"'sOzYWqLRC$  (1.753 m), weight 60.2 kg (132 lb 11.5 oz), SpO2 99 %.  cv- rrr Chest- cta abd- bs+, soft, nontender  Labs-  K 4.4  Alport's ESRD CHF  He is improved.  Will discuss with CV and renal regarding appropriate home meds (beta blocker vs ACE/ARB)  He will f/u at [facility], MD 04/27/2014, 12:01 PM

## 2014-04-27 NOTE — Progress Notes (Signed)
[name] to be D/C'd Home per MD order.  Discussed with the patient and all questions fully answered.    Medication List    STOP taking these medications        amLODipine 10 MG tablet  Commonly known as:  NORVASC     CARDIZEM CD 120 MG 24 hr capsule  Generic drug:  diltiazem     cephALEXin 500 MG capsule  Commonly known as:  KEFLEX     cloNIDine 0.1 MG tablet  Commonly known as:  CATAPRES      TAKE these medications        acetaminophen 325 MG tablet  Commonly known as:  TYLENOL  Take 325 mg by mouth daily as needed.     aspirin EC 81 MG tablet  Take 1 tablet (81 mg total) by mouth daily.     atorvastatin 20 MG tablet  Commonly known as:  LIPITOR  Take 1 tablet (20 mg total) by mouth daily.     calcitRIOL 0.25 MCG capsule  Commonly known as:  ROCALTROL  Take 0.5 mcg by mouth daily.     carvedilol 6.25 MG tablet  Commonly known as:  COREG  Take 1 tablet (6.25 mg total) by mouth 2 (two) times daily with a meal.     diphenhydrAMINE 25 MG tablet  Commonly known as:  SOMINEX  Take 25 mg by mouth at bedtime as needed for itching or sleep.     docusate sodium 100 MG capsule  Commonly known as:  COLACE  Take 100 mg by mouth 2 (two) times daily.     epoetin alfa 2000 UNIT/ML injection  Commonly known as:  EPOGEN,PROCRIT  Inject 4,000 Units into the skin 3 (three) times a week.     lisinopril 40 MG tablet  Commonly known as:  PRINIVIL,ZESTRIL  Take 1 tablet (40 mg total) by mouth daily.     multivitamin Tabs tablet  Take 1 tablet by mouth at bedtime.     omeprazole 20 MG capsule  Commonly known as:  PRILOSEC  Take 20 mg by mouth daily as needed (for acid reflux).     RENVELA 800 MG tablet  Generic drug:  sevelamer carbonate  Take 3,200 mg by mouth 3 (three) times daily.     SENSIPAR 30 MG tablet  Generic drug:  cinacalcet  Take 30 mg by mouth daily.     vitamin A 10000 UNIT capsule  Take 7,500 Units by mouth daily. Pt is unsure of this dose        VVS, Skin clean, dry and intact without evidence of skin break down, no evidence of skin tears noted. IV catheter discontinued intact. Site without signs and symptoms of complications. Dressing and pressure applied.  An After Visit Summary was printed and given to the patient.  D/c education completed with patient/family including follow up instructions, medication list, d/c activities limitations if indicated, with other d/c instructions as indicated by MD - patient able to verbalize understanding, all questions fully answered.   Patient instructed to return to ED, call 911, or call MD for any changes in condition.   Patient escorted via [place], and D/C home via private auto.  Geoffry Bannister D 04/27/2014 3:07 PM

## 2014-04-27 NOTE — Progress Notes (Signed)
Subjective:  Doing well. No sob, cp/n/v. Will go for HD today again. No dysuria. Has low urine output at baseline.   Objective: Vital signs in last 24 hours: Filed Vitals:   04/26/14 1135 04/26/14 1338 04/26/14 2240 04/27/14 0454  BP: 146/97 146/95 146/92 139/91  Pulse: 91 98 96 100  Temp: 98.4 F (36.9 C) 98 F (36.7 C) 98.6 F (37 C) 98.8 F (37.1 C)  TempSrc: Oral Oral Oral Oral  Resp: _0 Height:      Weight:      SpO2: 97% 100% 100% 99%   Weight change:   Intake/Output Summary (Last 24 hours) at 04/27/14 0710 Last data filed at 04/27/14 0125  Gross per 24 hour  Intake    720 ml  Output      0 ml  Net    720 ml   Vitals reviewed. General: resting in bed, NAD HEENT: PERRL, EOMI, no scleral icterus Cardiac: RRR, no rubs, murmurs or gallops Pulm: clear to auscultation bilaterally, no wheezes, rales, or rhonchi Abd: soft, nontender, nondistended, BS present. Well healing RLQ surgical site. Ext: warm and well perfused, no pedal edema Neuro: alert and oriented X3, cranial nerves II-XII grossly intact, strength and sensation to light touch equal in bilateral upper and lower extremities  Lab Results: Basic Metabolic Panel:  Recent Labs Lab 04/25/14 1645 04/26/14 0347 04/27/14 0348  NA  --  139 141  K  --  4.3 4.4  CL  --  96 97  CO2  --  28 25  GLUCOSE  --  87 69*  BUN  --  27* 44*  CREATININE  --  8.22* 11.74*  CALCIUM  --  9.8 9.4  PHOS 2.7  --   --    Liver Function Tests: No results for input(s): AST, ALT, ALKPHOS, BILITOT, PROT, ALBUMIN in the last 168 hours. No results for input(s): LIPASE, AMYLASE in the last 168 hours. No results for input(s): AMMONIA in the last 168 hours. CBC:  Recent Labs Lab 04/25/14 0655 04/25/14 0732 04/26/14 0347  WBC 11.4*  --  9.8  NEUTROABS  --   --  6.3  HGB 9.0* 10.5* 9.9*  HCT 28.5* 31.0* 31.3*  MCV 73.3*  --  70.7*  PLT 285  --  277   Cardiac Enzymes:  Recent Labs Lab 04/25/14 0655  04/25/14 1645 04/25/14 2015  TROPONINI <0.30 <0.30 <0.30   BNP: No results for input(s): PROBNP in the last 168 hours. D-Dimer: No results for input(s): DDIMER in the last 168 hours. CBG: No results for input(s): GLUCAP in the last 168 hours. Hemoglobin A1C: No results for input(s): HGBA1C in the last 168 hours. Fasting Lipid Panel: No results for input(s): CHOL, HDL, LDLCALC, TRIG, CHOLHDL, LDLDIRECT in the last 168 hours. Thyroid Function Tests: No results for input(s): TSH, T4TOTAL, FREET4, T3FREE, THYROIDAB in the last 168 hours. Coagulation: No results for input(s): LABPROT, INR in the last 168 hours. Anemia Panel: No results for input(s): VITAMINB12, FOLATE, FERRITIN, TIBC, IRON, RETICCTPCT in the last 168 hours. Urine Drug Screen: Drugs of Abuse  No results found for: LABOPIA, COCAINSCRNUR, LABBENZ, AMPHETMU, THCU, LABBARB  Alcohol Level: No results for input(s): ETH in the last 168 hours. Urinalysis: No results for input(s): COLORURINE, LABSPEC, PHURINE, GLUCOSEU, HGBUR, BILIRUBINUR, KETONESUR, PROTEINUR, UROBILINOGEN, NITRITE, LEUKOCYTESUR in the last 168 hours.  Invalid input(s): APPERANCEUR Misc. Labs:  Micro Results: Recent Results (from the past 240 hour(s))  Blood culture (routine  x 2)     Status: None (Preliminary result)   Collection Time: 04/25/14  8:19 AM  Result Value Ref Range Status   Specimen Description BLOOD RIGHT ANTECUBITAL  Final   Special Requests BOTTLES DRAWN AEROBIC AND ANAEROBIC 5MLS  Final   Culture  Setup Time   Final    04/25/2014 13:55 Performed at [facility]    Culture   Final           BLOOD CULTURE RECEIVED NO GROWTH TO DATE CULTURE WILL BE HELD FOR 5 DAYS BEFORE ISSUING A FINAL NEGATIVE REPORT Performed at [facility]    Report Status PENDING  Incomplete  Blood culture (routine x 2)     Status: None (Preliminary result)   Collection Time: 04/25/14  8:19 AM  Result Value Ref Range Status   Specimen  Description BLOOD RIGHT HAND  Final   Special Requests BOTTLES DRAWN AEROBIC AND ANAEROBIC 5MLS  Final   Culture  Setup Time   Final    04/25/2014 13:56 Performed at [facility]    Culture   Final           BLOOD CULTURE RECEIVED NO GROWTH TO DATE CULTURE WILL BE HELD FOR 5 DAYS BEFORE ISSUING A FINAL NEGATIVE REPORT Performed at [facility]    Report Status PENDING  Incomplete  MRSA PCR Screening     Status: None   Collection Time: 04/25/14  4:27 PM  Result Value Ref Range Status   MRSA by PCR NEGATIVE NEGATIVE Final    Comment:        The GeneXpert MRSA Assay (FDA approved for NASAL specimens only), is one component of a comprehensive MRSA colonization surveillance program. It is not intended to diagnose MRSA infection nor to guide or monitor treatment for MRSA infections.   Respiratory virus antigens panel     Status: None   Collection Time: 04/25/14  8:37 PM  Result Value Ref Range Status   Source - RVPAN NASAL SWAB  Corrected    Comment: CORRECTED ON 11/11 AT 2132: PREVIOUSLY REPORTED AS NASAL SWAB   Respiratory Syncytial Virus A NOT DETECTED  Final   Respiratory Syncytial Virus B NOT DETECTED  Final   Influenza A NOT DETECTED  Final   Influenza B NOT DETECTED  Final   Parainfluenza 1 NOT DETECTED  Final   Parainfluenza 2 NOT DETECTED  Final   Parainfluenza 3 NOT DETECTED  Final   Metapneumovirus NOT DETECTED  Final   Rhinovirus NOT DETECTED  Final   Adenovirus NOT DETECTED  Final   Influenza A H1 NOT DETECTED  Final   Influenza A H3 NOT DETECTED  Final    Comment: (NOTE)       Normal Reference Range for each Analyte: NOT DETECTED Testing performed using the Luminex xTAG Respiratory Viral Panel test kit. The analytical performance characteristics of this assay have been determined by [facility].  The modifications have not been cleared or approved by the FDA. This assay has been validated pursuant to the CLIA regulations and is used  for clinical purposes. Performed at [facility]    Studies/Results: Dg Chest 2 View  04/26/2014   CLINICAL DATA:  Healthcare associated pneumonia  EXAM: CHEST  2 VIEW  COMPARISON:  04/25/2014  FINDINGS: Cardiomediastinal silhouette is stable. There is improvement in aeration. Residual mild interstitial prominence right perihilar region probable residual edema or pneumonitis. No confluent infiltrates. No pleural effusion.  IMPRESSION: Improvement in  aeration. Mild interstitial prominence right perihilar and infrahilar region probable residual edema or pneumonitis. No confluent infiltrates.   Electronically Signed   By: Lahoma Crocker M.D.   On: 04/26/2014 08:10   Medications: I have reviewed the patient's current medications. Scheduled Meds: . amLODipine  10 mg Oral Daily  . calcitRIOL  1.25 mcg Oral Q T,Th,Sa-HD  . cinacalcet  30 mg Oral Q breakfast  . darbepoetin (ARANESP) injection - DIALYSIS  200 mcg Intravenous Q Tue-HD  . diltiazem  120 mg Oral Daily  . docusate sodium  100 mg Oral BID  . heparin  5,000 Units Subcutaneous 3 times per day  . multivitamin  1 tablet Oral QHS  . pantoprazole  40 mg Oral Daily  . sevelamer carbonate  2,400 mg Oral TID WC  . 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  Acute resp distress - 2/2 Volume overload - with pulmonary edema. Either 2/2 to CKD or CHF. Improved. unclear cause. Could be from ESRD but did receive his scheduled HD without any problem. Patient also has CHF  on new echo.  In the ED he received HCAP coverage but infection is very unlikely as CXR is more indicative of flash pulm edema and patient improved significantly with HD. No abx.  - CXR shows improvement of volume status after dialysis. He is feeling much better. Will likely send him home today after HD.  ESRD on T,Th,Sat dialysis - 2/2 to Alport's syndrome. - got dialyzed last 04/22/14. Usually gets HD without any complication. Had kidney tx on 2009. Had failure and had transplant taken out Oct 2015, was having hematuria that time. Got 1unit transfusion 2 weeks ago.  -HD again today  - with sec HPTH. cont calcitriol and sensipar. - Cont outpatient HD once discharge.   Systolic CHF - echo 34/94/94 LVH, 35-40% EF. Will start asa, beta blocker, statin. - will do lisinopril 5m daily + coreg 3.1239mBID + 81 asa, 1051mipitor.   Anemia - likely 2/2 to CKD - cont aranesp. Con  HTN - in 140's. - takes amlodopine 5mg73mily, clonidine 0.1mg 23mly, and dilt 120mg 35m cap daily - will stop amlodopine and dilt. Will do lisinopril 20mg d33m + coreg 3.125mg BI48mr cardiac protection for CHF.   Dispo: Disposition is deferred at this time, awaiting improvement of current medical problems.  Anticipated discharge in approximately 1-2 day(s).   The patient does have a current PCP (No Pcp Per Patient) [facility]  The patient does have transportation limitations that hinder transportation to clinic appointments.  .Services Needed at time of discharge: Y = Yes, Blank = No PT:   OT:   RN:   Equipment:   Other:     LOS: 2 days   Jonathandavid Marlett ADellia Nims12/2015, 7:10 AM

## 2014-04-27 NOTE — Progress Notes (Signed)
Subjective:  Patient states that he is still feeling much improved this morning. No acute events overnight. He denies any shob, cp, or headaches. He states that he very rarely will have to cough. He underwent HD this AM with no complications. He feels that he is ready to go home.   Objective: Vital signs in last 24 hours: Filed Vitals:   04/27/14 0930 04/27/14 1000 04/27/14 1030 04/27/14 1100  BP: 139/98 147/98 150/99 139/104  Pulse: 100 106 112 114  Temp:      TempSrc:      Resp: $Remo'20 20 19 19  'OIYUG$ Height:      Weight:      SpO2:        Intake/Output Summary (Last 24 hours) at 04/27/14 1144 Last data filed at 04/27/14 0125  Gross per 24 hour  Intake    720 ml  Output      0 ml  Net    720 ml   BP 139/104 mmHg  Pulse 114  Temp(Src) 98.9 F (37.2 C) (Oral)  Resp 19  Ht $R'5\' 9"'Fo$  (1.753 m)  Wt 60.2 kg (132 lb 11.5 oz)  BMI 19.59 kg/m2  SpO2 99%   Vitals reviewed and are stable. General: well-appearing, resting comfortably in bed HEENT: PERRL, EOMI, no scleral icterus CV: RRR, no m/r/g Pulm: Lungs CTAB. No w/r/r Abd: soft, NT/ND. +BS. Well-healing RLQ incision site Ext: No edema noted. WWP. Neuro: Alert and oriented x3. CN II-XII grossly intact.  Lab Results:  Basic Metabolic Panel:  Recent Labs Lab 04/25/14 1645 04/26/14 0347 04/27/14 0348  NA  --  139 141  K  --  4.3 4.4  CL  --  96 97  CO2  --  28 25  GLUCOSE  --  87 69*  BUN  --  27* 44*  CREATININE  --  8.22* 11.74*  CALCIUM  --  9.8 9.4  PHOS 2.7  --   --    CBC:  Recent Labs Lab 04/25/14 0655 04/25/14 0732 04/26/14 0347  WBC 11.4*  --  9.8  NEUTROABS  --   --  6.3  HGB 9.0* 10.5* 9.9*  HCT 28.5* 31.0* 31.3*  MCV 73.3*  --  70.7*  PLT 285  --  277   Cardiac Enzymes:  Recent Labs Lab 04/25/14 0655 04/25/14 1645 04/25/14 2015  TROPONINI <0.30 <0.30 <0.30    Micro Results: Recent Results (from the past 240 hour(s))  Blood culture (routine x 2)     Status: None (Preliminary result)   Collection Time: 04/25/14  8:19 AM  Result Value Ref Range Status   Specimen Description BLOOD RIGHT ANTECUBITAL  Final   Special Requests BOTTLES DRAWN AEROBIC AND ANAEROBIC 5MLS  Final   Culture  Setup Time   Final    04/25/2014 13:55 Performed at [facility]    Culture   Final           BLOOD CULTURE RECEIVED NO GROWTH TO DATE CULTURE WILL BE HELD FOR 5 DAYS BEFORE ISSUING A FINAL NEGATIVE REPORT Performed at [facility]    Report Status PENDING  Incomplete  Blood culture (routine x 2)     Status: None (Preliminary result)   Collection Time: 04/25/14  8:19 AM  Result Value Ref Range Status   Specimen Description BLOOD RIGHT HAND  Final   Special Requests BOTTLES DRAWN AEROBIC AND ANAEROBIC 5MLS  Final   Culture  Setup Time   Final  04/25/2014 13:56 Performed at [facility]    Culture   Final           BLOOD CULTURE RECEIVED NO GROWTH TO DATE CULTURE WILL BE HELD FOR 5 DAYS BEFORE ISSUING A FINAL NEGATIVE REPORT Performed at [facility]    Report Status PENDING  Incomplete  MRSA PCR Screening     Status: None   Collection Time: 04/25/14  4:27 PM  Result Value Ref Range Status   MRSA by PCR NEGATIVE NEGATIVE Final    Comment:        The GeneXpert MRSA Assay (FDA approved for NASAL specimens only), is one component of a comprehensive MRSA colonization surveillance program. It is not intended to diagnose MRSA infection nor to guide or monitor treatment for MRSA infections.   Respiratory virus antigens panel     Status: None   Collection Time: 04/25/14  8:37 PM  Result Value Ref Range Status   Source - RVPAN NASAL SWAB  Corrected    Comment: CORRECTED ON 11/11 AT 2132: PREVIOUSLY REPORTED AS NASAL SWAB   Respiratory Syncytial Virus A NOT DETECTED  Final   Respiratory Syncytial Virus B NOT DETECTED  Final   Influenza A NOT DETECTED  Final   Influenza B NOT DETECTED  Final   Parainfluenza 1 NOT DETECTED  Final   Parainfluenza 2 NOT  DETECTED  Final   Parainfluenza 3 NOT DETECTED  Final   Metapneumovirus NOT DETECTED  Final   Rhinovirus NOT DETECTED  Final   Adenovirus NOT DETECTED  Final   Influenza A H1 NOT DETECTED  Final   Influenza A H3 NOT DETECTED  Final    Comment: (NOTE)       Normal Reference Range for each Analyte: NOT DETECTED Testing performed using the Luminex xTAG Respiratory Viral Panel test kit. The analytical performance characteristics of this assay have been determined by [facility].  The modifications have not been cleared or approved by the FDA. This assay has been validated pursuant to the CLIA regulations and is used for clinical purposes. Performed at [facility]    Studies/Results: Dg Chest 2 View  04/26/2014   CLINICAL DATA:  Healthcare associated pneumonia  EXAM: CHEST  2 VIEW  COMPARISON:  04/25/2014  FINDINGS: Cardiomediastinal silhouette is stable. There is improvement in aeration. Residual mild interstitial prominence right perihilar region probable residual edema or pneumonitis. No confluent infiltrates. No pleural effusion.  IMPRESSION: Improvement in aeration. Mild interstitial prominence right perihilar and infrahilar region probable residual edema or pneumonitis. No confluent infiltrates.   Electronically Signed   By: Lahoma Crocker M.D.   On: 04/26/2014 08:10   Echocardiogram (04/26/14)  Impressions:  - Normal LV size with moderate LV hypertrophy, EF 35-40%. Diffuse hypokinesis. Small circumferential pericardial effusion. Normal RV size and systolic function. The appearance of the myocardium raises concern for cardiac amyloidosis.  Medications: I have reviewed the patient's current medications. Scheduled Meds: . amLODipine  10 mg Oral Daily  . calcitRIOL  1.25 mcg Oral Q T,Th,Sa-HD  . cinacalcet  30 mg Oral Q breakfast  . darbepoetin (ARANESP) injection - DIALYSIS  200 mcg Intravenous Q Tue-HD  . diltiazem  120 mg Oral Daily  . docusate sodium  100  mg Oral BID  . heparin  5,000 Units Subcutaneous 3 times per day  . multivitamin  1 tablet Oral QHS  . pantoprazole  40 mg Oral Daily  . sevelamer carbonate  2,400 mg Oral TID WC  .  sodium chloride  3 mL Intravenous Q12H   PRN Meds:.sodium chloride, aspirin-acetaminophen-caffeine, hydrALAZINE, pneumococcal 23 valent vaccine, sodium chloride   Assessment/Plan: Principal Problem:   Acute dyspnea Active Problems:   HCAP (healthcare-associated pneumonia)   Alport syndrome   Essential (primary) hypertension   H/O kidney transplant   Flash pulmonary edema  28 y.o. 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 Presented to the ED 2 nights ago with new onset shob, cp, and cough likely 2/2 volume overload. Much improved. Appears to have returned to his baseline.  1.) Acute Respiratory Distress - was likely 2/2 volume overload/CKD or previously undiagnosed CHF. Much improved. Originally received coverage for HCAP on presentation to the ED. Abx d/c'ed as CXR showed flash pulmonary edema and pt improved significantly after HD.  2.) Systolic CHF -Newly diagnosed with ECHO as above. EF 35-40%. Plan to start on $Remove'81mg'xTOQXEv$  ASA qd, lisinopril $RemoveBefore'20mg'duiQQLMgfNcVM$  qd, coreg 3.$RemoveBef'125mg'ywMpdXalNP$  BID, and $Remove'10mg'WHfJzkm$  Lipitor qd.  3.) ESRD - 2/2 Alport's syndrome. On T,H,S Dialysis. Has been dialyzed daily during hospital course. Kidney tx in 2009, subsequent failure and removal Oct 2015.   - HD again today before D/C.   - Continue outpt HD schedule.  4.) HTN - hovering around 140/90. Takes amlodipine $RemoveBeforeDE'5mg'GgvPtyPcwtzOeGS$  qd, clonidine 0.$RemoveBeforeD'1mg'uCUpLadsxWmUYK$  qd, and dilt $Remove'120mg'PgoRGui$  24hr cap qd.  - plan to d/c amlodipine and dilt. Starting on lisinopril $RemoveBefor'20mg'zpItHGpqkGnn$  qd + coreg 3.$RemoveB'125mg'qKJXPcpR$  BID d/t newly diagnosed CHF.  5.) Anemia - likely 2/2 CKD. Continue aranesp. Received 1 u RBCs 2 weeks ago.  Dispo: Anticipating discharge of the patient home today, pending med reconciliation given new diagnosis of CHF.  The patient does not have a current PCP (No Pcp Per Patient) and does  need an [facility] hospital follow-up appointment after discharge.  The patient does not have transportation limitations that hinder transportation to clinic appointments.  .Services Needed at time of discharge: Y = Yes, Blank = No PT:   OT:   RN:   Equipment:   Other:     LOS: 2 days   Farrell Ours, Med Student 04/27/2014, 11:44 AM

## 2014-04-27 NOTE — Discharge Summary (Addendum)
Name: [name] MRN: [id number] DOB: [birth year] 28 y.o. PCP: No Pcp Per Patient  Date of Admission: 04/25/2014  6:24 AM Date of Discharge: 04/27/2014 Attending Physician: Campbell Riches, MD  Discharge Diagnosis: 1.  Principal Problem:   Acute dyspnea Active Problems:   Alport syndrome   Essential (primary) hypertension   H/O kidney transplant   Flash pulmonary edema  Discharge Medications:   Medication List    STOP taking these medications        amLODipine 10 MG tablet  Commonly known as:  NORVASC     CARDIZEM CD 120 MG 24 hr capsule  Generic drug:  diltiazem     cephALEXin 500 MG capsule  Commonly known as:  KEFLEX     cloNIDine 0.1 MG tablet  Commonly known as:  CATAPRES      TAKE these medications        acetaminophen 325 MG tablet  Commonly known as:  TYLENOL  Take 325 mg by mouth daily as needed.     aspirin EC 81 MG tablet  Take 1 tablet (81 mg total) by mouth daily.     atorvastatin 20 MG tablet  Commonly known as:  LIPITOR  Take 1 tablet (20 mg total) by mouth daily.     calcitRIOL 0.25 MCG capsule  Commonly known as:  ROCALTROL  Take 0.5 mcg by mouth daily.     carvedilol 6.25 MG tablet  Commonly known as:  COREG  Take 1 tablet (6.25 mg total) by mouth 2 (two) times daily with a meal.     diphenhydrAMINE 25 MG tablet  Commonly known as:  SOMINEX  Take 25 mg by mouth at bedtime as needed for itching or sleep.     docusate sodium 100 MG capsule  Commonly known as:  COLACE  Take 100 mg by mouth 2 (two) times daily.     epoetin alfa 2000 UNIT/ML injection  Commonly known as:  EPOGEN,PROCRIT  Inject 4,000 Units into the skin 3 (three) times a week.     lisinopril 40 MG tablet  Commonly known as:  PRINIVIL,ZESTRIL  Take 1 tablet (40 mg total) by mouth daily.     multivitamin Tabs tablet  Take 1 tablet by mouth at bedtime.     omeprazole 20 MG capsule  Commonly known as:  PRILOSEC  Take 20 mg by mouth daily as needed (for  acid reflux).     RENVELA 800 MG tablet  Generic drug:  sevelamer carbonate  Take 3,200 mg by mouth 3 (three) times daily.     SENSIPAR 30 MG tablet  Generic drug:  cinacalcet  Take 30 mg by mouth daily.     vitamin A 10000 UNIT capsule  Take 7,500 Units by mouth daily. Pt is unsure of this dose        Disposition and follow-up:   Mr.[name] was discharged from [facility] in Stable condition.  At the hospital follow up visit please address:  1.  We changed his bp meds. Stopped amlodipine and dilt. Started coreg & lisinopril since he has combined CHF on echo. Please assess how he is doing on this meds. Also started asa 81 and lipitor.  2.  Labs / imaging needed at time of follow-up: BMP  3.  Pending labs/ test needing follow-up:   Follow-up Appointments: Follow-up Information    Follow up with No PCP Per Patient.   Specialty:  General Practice  Discharge Instructions:   Consultations: Treatment Team:  Windy Kalata, MD  Procedures Performed:  Dg Chest 2 View  04/26/2014   CLINICAL DATA:  Healthcare associated pneumonia  EXAM: CHEST  2 VIEW  COMPARISON:  04/25/2014  FINDINGS: Cardiomediastinal silhouette is stable. There is improvement in aeration. Residual mild interstitial prominence right perihilar region probable residual edema or pneumonitis. No confluent infiltrates. No pleural effusion.  IMPRESSION: Improvement in aeration. Mild interstitial prominence right perihilar and infrahilar region probable residual edema or pneumonitis. No confluent infiltrates.   Electronically Signed   By: Lahoma Crocker M.D.   On: 04/26/2014 08:10   Dg Chest Port 1 View  04/25/2014   CLINICAL DATA:  Shortness of breath and cough. Last dialysis Saturday.  EXAM: PORTABLE CHEST - 1 VIEW  COMPARISON:  05/06/2012  FINDINGS: There is cardiopericardial enlargement which is new from 2013. Pericardial fluid could contribute to the size, although the morphology is  very similar to prior. Negative aortic and hilar contours. Bilateral airspace disease, fairly symmetric. No interlobular septal thickening. No effusion or pneumothorax.  IMPRESSION: 1. Bilateral airspace disease which could reflect alveolar edema or multi lobar pneumonia. 2. Cardiopericardial enlargement, new from 2013.   Electronically Signed   By: Jorje Guild M.D.   On: 04/25/2014 07:07    2D Echo:   Normal LV size with moderate LV hypertrophy, EF 35-40%. Diffuse hypokinesis. Small circumferential pericardial effusion. Normal RV size and systolic function. The appearance of the myocardium raises concern for cardiac amyloidosis.  Cardiac Cath:   Admission HPI:   Mr. [name] is a 27 yo man pmh Alport syndrome with renal failure s/p removed transplanted kidney in 6/15 off of all immunosuppressive therapy, HTN presents with worsening cough, dyspnea, and chest pain for the past 12 hours. Pt states that at about 11 pm he woke up from sleep with extreme dyspnea and some left sided chest pain. The patient states the pain is sharp and worsened with the cough. The cough is productive of white and blood tinged sputum. He denies any sick contacts but works as a Presenter, broadcasting at [employer] and has small children that go to school at home. He does feel warm and have intermittent chills but no night sweats. He has had some increased fatigue and anorexia over the past 2-3 days as well. He denied any radiating chest pain, HA, syncope, LE edema, rashes, blurry vision, leg weakness, nausea/vomiting/diarrhea, or abdominal pain. He is not a smoker and denied any alcohol or drugs. He did receive his flu shot at HD and completed his last HD w/o complications or concerns. The pt only intermittently urinates and denies any pyuria with those episodes. He reports no other changes in travel, foods, or medications between this time period.   Hospital Course by problem list: Principal Problem:   Acute dyspnea Active  Problems:   Alport syndrome   Essential (primary) hypertension   H/O kidney transplant   Flash pulmonary edema   28 yo male with ESRD on HD from Alport's disease here with resp distress likely from volume overload due to unclear cause.  Acute resp distress - 2/2 Volume overload - with pulmonary edema. Either 2/2 to CKD or CHF. Improved. unclear cause. Could be from ESRD but did receive his scheduled HD without any problem. Patient also has CHF on new echo.   In the ED he received HCAP coverage but infection is very unlikely as CXR is more indicative of flash pulm edema and patient improved significantly  with HD. No abx.  - CXR shows improvement of volume status after dialysis. He is feeling much better. Stable for discharge.   ESRD on T,Th,Sat dialysis - 2/2 to Alport's syndrome. - got dialyzed last 04/22/14. Usually gets HD without any complication. Had kidney tx on 2009. Had failure and had transplant taken out Oct 2015, was having hematuria that time. Got 1unit transfusion 2 weeks ago.  -HD again today. - with sec HPTH. cont calcitriol and sensipar. - Cont outpatient HD once discharge.   Acute systolic CHF - echo 16/10/96 LVH, 35-40% EF. Will start asa, beta blocker, statin. - will do lisinopril 40mg  daily + coreg 6.25mg  BID + 81 asa, 20mg  lipitor.   Anemia - likely 2/2 to CKD - cont aranesp. Con  HTN - in 140's. - takes amlodopine 5mg  daily, clonidine 0.1mg  daily, and dilt 120mg  24hr cap daily - will stop amlodopine and dilt. Will do lisinopril 40mg  daily + coreg 6.25mg  BID for cardiac protection for CHF.    Discharge Vitals:   BP 133/96 mmHg  Pulse 105  Temp(Src) 98.4 F (36.9 C) (Oral)  Resp 27  Ht 5\' 9"  (1.753 m)  Wt 57.5 kg (126 lb 12.2 oz)  BMI 18.71 kg/m2  SpO2 99%  Discharge Labs:  Results for orders placed or performed during the hospital encounter of 04/25/14 (from the past 24 hour(s))  Basic metabolic panel     Status: Abnormal   Collection Time: 04/27/14  3:48  AM  Result Value Ref Range   Sodium 141 137 - 147 mEq/L   Potassium 4.4 3.7 - 5.3 mEq/L   Chloride 97 96 - 112 mEq/L   CO2 25 19 - 32 mEq/L   Glucose, Bld 69 (L) 70 - 99 mg/dL   BUN 44 (H) 6 - 23 mg/dL   Creatinine, Ser 11.74 (H) 0.50 - 1.35 mg/dL   Calcium 9.4 8.4 - 10.5 mg/dL   GFR calc non Af Amer 5 (L) >90 mL/min   GFR calc Af Amer 6 (L) >90 mL/min   Anion gap 19 (H) 5 - 15    Signed: Dellia Nims, MD 04/27/2014, 1:59 PM    Services Ordered on Discharge:  Equipment Ordered on Discharge:

## 2014-05-01 LAB — CULTURE, BLOOD (ROUTINE X 2)
CULTURE: NO GROWTH
Culture: NO GROWTH

## 2014-05-15 DIAGNOSIS — Z992 Dependence on renal dialysis: Secondary | ICD-10-CM | POA: Diagnosis not present

## 2014-05-15 DIAGNOSIS — N186 End stage renal disease: Secondary | ICD-10-CM | POA: Diagnosis not present

## 2014-05-16 DIAGNOSIS — N2581 Secondary hyperparathyroidism of renal origin: Secondary | ICD-10-CM | POA: Diagnosis not present

## 2014-05-16 DIAGNOSIS — D509 Iron deficiency anemia, unspecified: Secondary | ICD-10-CM | POA: Diagnosis not present

## 2014-05-16 DIAGNOSIS — N186 End stage renal disease: Secondary | ICD-10-CM | POA: Diagnosis not present

## 2014-05-16 DIAGNOSIS — D631 Anemia in chronic kidney disease: Secondary | ICD-10-CM | POA: Diagnosis not present

## 2014-06-15 DIAGNOSIS — Z992 Dependence on renal dialysis: Secondary | ICD-10-CM | POA: Diagnosis not present

## 2014-06-15 DIAGNOSIS — N186 End stage renal disease: Secondary | ICD-10-CM | POA: Diagnosis not present

## 2014-06-18 DIAGNOSIS — N186 End stage renal disease: Secondary | ICD-10-CM | POA: Diagnosis not present

## 2014-06-18 DIAGNOSIS — D509 Iron deficiency anemia, unspecified: Secondary | ICD-10-CM | POA: Diagnosis not present

## 2014-06-18 DIAGNOSIS — N2581 Secondary hyperparathyroidism of renal origin: Secondary | ICD-10-CM | POA: Diagnosis not present

## 2014-06-18 DIAGNOSIS — D631 Anemia in chronic kidney disease: Secondary | ICD-10-CM | POA: Diagnosis not present

## 2014-06-20 DIAGNOSIS — N186 End stage renal disease: Secondary | ICD-10-CM | POA: Diagnosis not present

## 2014-06-20 DIAGNOSIS — D631 Anemia in chronic kidney disease: Secondary | ICD-10-CM | POA: Diagnosis not present

## 2014-06-20 DIAGNOSIS — D509 Iron deficiency anemia, unspecified: Secondary | ICD-10-CM | POA: Diagnosis not present

## 2014-06-20 DIAGNOSIS — N2581 Secondary hyperparathyroidism of renal origin: Secondary | ICD-10-CM | POA: Diagnosis not present

## 2014-06-22 DIAGNOSIS — N186 End stage renal disease: Secondary | ICD-10-CM | POA: Diagnosis not present

## 2014-06-22 DIAGNOSIS — D631 Anemia in chronic kidney disease: Secondary | ICD-10-CM | POA: Diagnosis not present

## 2014-06-22 DIAGNOSIS — N2581 Secondary hyperparathyroidism of renal origin: Secondary | ICD-10-CM | POA: Diagnosis not present

## 2014-06-22 DIAGNOSIS — D509 Iron deficiency anemia, unspecified: Secondary | ICD-10-CM | POA: Diagnosis not present

## 2014-06-24 DIAGNOSIS — D631 Anemia in chronic kidney disease: Secondary | ICD-10-CM | POA: Diagnosis not present

## 2014-06-24 DIAGNOSIS — N2581 Secondary hyperparathyroidism of renal origin: Secondary | ICD-10-CM | POA: Diagnosis not present

## 2014-06-24 DIAGNOSIS — D509 Iron deficiency anemia, unspecified: Secondary | ICD-10-CM | POA: Diagnosis not present

## 2014-06-24 DIAGNOSIS — N186 End stage renal disease: Secondary | ICD-10-CM | POA: Diagnosis not present

## 2014-06-27 DIAGNOSIS — N2581 Secondary hyperparathyroidism of renal origin: Secondary | ICD-10-CM | POA: Diagnosis not present

## 2014-06-27 DIAGNOSIS — N186 End stage renal disease: Secondary | ICD-10-CM | POA: Diagnosis not present

## 2014-06-27 DIAGNOSIS — D631 Anemia in chronic kidney disease: Secondary | ICD-10-CM | POA: Diagnosis not present

## 2014-06-27 DIAGNOSIS — D509 Iron deficiency anemia, unspecified: Secondary | ICD-10-CM | POA: Diagnosis not present

## 2014-06-29 DIAGNOSIS — N186 End stage renal disease: Secondary | ICD-10-CM | POA: Diagnosis not present

## 2014-06-29 DIAGNOSIS — D509 Iron deficiency anemia, unspecified: Secondary | ICD-10-CM | POA: Diagnosis not present

## 2014-06-29 DIAGNOSIS — N2581 Secondary hyperparathyroidism of renal origin: Secondary | ICD-10-CM | POA: Diagnosis not present

## 2014-06-29 DIAGNOSIS — D631 Anemia in chronic kidney disease: Secondary | ICD-10-CM | POA: Diagnosis not present

## 2014-07-01 DIAGNOSIS — D509 Iron deficiency anemia, unspecified: Secondary | ICD-10-CM | POA: Diagnosis not present

## 2014-07-01 DIAGNOSIS — D631 Anemia in chronic kidney disease: Secondary | ICD-10-CM | POA: Diagnosis not present

## 2014-07-01 DIAGNOSIS — N186 End stage renal disease: Secondary | ICD-10-CM | POA: Diagnosis not present

## 2014-07-01 DIAGNOSIS — N2581 Secondary hyperparathyroidism of renal origin: Secondary | ICD-10-CM | POA: Diagnosis not present

## 2014-07-04 DIAGNOSIS — N2581 Secondary hyperparathyroidism of renal origin: Secondary | ICD-10-CM | POA: Diagnosis not present

## 2014-07-04 DIAGNOSIS — D509 Iron deficiency anemia, unspecified: Secondary | ICD-10-CM | POA: Diagnosis not present

## 2014-07-04 DIAGNOSIS — D631 Anemia in chronic kidney disease: Secondary | ICD-10-CM | POA: Diagnosis not present

## 2014-07-04 DIAGNOSIS — N186 End stage renal disease: Secondary | ICD-10-CM | POA: Diagnosis not present

## 2014-07-06 DIAGNOSIS — D631 Anemia in chronic kidney disease: Secondary | ICD-10-CM | POA: Diagnosis not present

## 2014-07-06 DIAGNOSIS — N186 End stage renal disease: Secondary | ICD-10-CM | POA: Diagnosis not present

## 2014-07-06 DIAGNOSIS — N2581 Secondary hyperparathyroidism of renal origin: Secondary | ICD-10-CM | POA: Diagnosis not present

## 2014-07-06 DIAGNOSIS — D509 Iron deficiency anemia, unspecified: Secondary | ICD-10-CM | POA: Diagnosis not present

## 2014-07-08 DIAGNOSIS — N186 End stage renal disease: Secondary | ICD-10-CM | POA: Diagnosis not present

## 2014-07-08 DIAGNOSIS — D509 Iron deficiency anemia, unspecified: Secondary | ICD-10-CM | POA: Diagnosis not present

## 2014-07-08 DIAGNOSIS — N2581 Secondary hyperparathyroidism of renal origin: Secondary | ICD-10-CM | POA: Diagnosis not present

## 2014-07-08 DIAGNOSIS — D631 Anemia in chronic kidney disease: Secondary | ICD-10-CM | POA: Diagnosis not present

## 2014-07-11 DIAGNOSIS — D509 Iron deficiency anemia, unspecified: Secondary | ICD-10-CM | POA: Diagnosis not present

## 2014-07-11 DIAGNOSIS — N2581 Secondary hyperparathyroidism of renal origin: Secondary | ICD-10-CM | POA: Diagnosis not present

## 2014-07-11 DIAGNOSIS — D631 Anemia in chronic kidney disease: Secondary | ICD-10-CM | POA: Diagnosis not present

## 2014-07-11 DIAGNOSIS — N186 End stage renal disease: Secondary | ICD-10-CM | POA: Diagnosis not present

## 2014-07-13 DIAGNOSIS — N2581 Secondary hyperparathyroidism of renal origin: Secondary | ICD-10-CM | POA: Diagnosis not present

## 2014-07-13 DIAGNOSIS — D509 Iron deficiency anemia, unspecified: Secondary | ICD-10-CM | POA: Diagnosis not present

## 2014-07-13 DIAGNOSIS — D631 Anemia in chronic kidney disease: Secondary | ICD-10-CM | POA: Diagnosis not present

## 2014-07-13 DIAGNOSIS — N186 End stage renal disease: Secondary | ICD-10-CM | POA: Diagnosis not present

## 2014-07-15 DIAGNOSIS — N2581 Secondary hyperparathyroidism of renal origin: Secondary | ICD-10-CM | POA: Diagnosis not present

## 2014-07-15 DIAGNOSIS — N186 End stage renal disease: Secondary | ICD-10-CM | POA: Diagnosis not present

## 2014-07-15 DIAGNOSIS — D631 Anemia in chronic kidney disease: Secondary | ICD-10-CM | POA: Diagnosis not present

## 2014-07-15 DIAGNOSIS — D509 Iron deficiency anemia, unspecified: Secondary | ICD-10-CM | POA: Diagnosis not present

## 2014-07-16 DIAGNOSIS — N186 End stage renal disease: Secondary | ICD-10-CM | POA: Diagnosis not present

## 2014-07-16 DIAGNOSIS — Z992 Dependence on renal dialysis: Secondary | ICD-10-CM | POA: Diagnosis not present

## 2014-07-18 DIAGNOSIS — D509 Iron deficiency anemia, unspecified: Secondary | ICD-10-CM | POA: Diagnosis not present

## 2014-07-18 DIAGNOSIS — D631 Anemia in chronic kidney disease: Secondary | ICD-10-CM | POA: Diagnosis not present

## 2014-07-18 DIAGNOSIS — N2581 Secondary hyperparathyroidism of renal origin: Secondary | ICD-10-CM | POA: Diagnosis not present

## 2014-07-18 DIAGNOSIS — N186 End stage renal disease: Secondary | ICD-10-CM | POA: Diagnosis not present

## 2014-07-20 DIAGNOSIS — D509 Iron deficiency anemia, unspecified: Secondary | ICD-10-CM | POA: Diagnosis not present

## 2014-07-20 DIAGNOSIS — N186 End stage renal disease: Secondary | ICD-10-CM | POA: Diagnosis not present

## 2014-07-20 DIAGNOSIS — N2581 Secondary hyperparathyroidism of renal origin: Secondary | ICD-10-CM | POA: Diagnosis not present

## 2014-07-20 DIAGNOSIS — D631 Anemia in chronic kidney disease: Secondary | ICD-10-CM | POA: Diagnosis not present

## 2014-07-22 DIAGNOSIS — D509 Iron deficiency anemia, unspecified: Secondary | ICD-10-CM | POA: Diagnosis not present

## 2014-07-22 DIAGNOSIS — N186 End stage renal disease: Secondary | ICD-10-CM | POA: Diagnosis not present

## 2014-07-22 DIAGNOSIS — N2581 Secondary hyperparathyroidism of renal origin: Secondary | ICD-10-CM | POA: Diagnosis not present

## 2014-07-22 DIAGNOSIS — D631 Anemia in chronic kidney disease: Secondary | ICD-10-CM | POA: Diagnosis not present

## 2014-07-25 DIAGNOSIS — D631 Anemia in chronic kidney disease: Secondary | ICD-10-CM | POA: Diagnosis not present

## 2014-07-25 DIAGNOSIS — N2581 Secondary hyperparathyroidism of renal origin: Secondary | ICD-10-CM | POA: Diagnosis not present

## 2014-07-25 DIAGNOSIS — D509 Iron deficiency anemia, unspecified: Secondary | ICD-10-CM | POA: Diagnosis not present

## 2014-07-25 DIAGNOSIS — N186 End stage renal disease: Secondary | ICD-10-CM | POA: Diagnosis not present

## 2014-07-27 DIAGNOSIS — N186 End stage renal disease: Secondary | ICD-10-CM | POA: Diagnosis not present

## 2014-07-27 DIAGNOSIS — D509 Iron deficiency anemia, unspecified: Secondary | ICD-10-CM | POA: Diagnosis not present

## 2014-07-27 DIAGNOSIS — N2581 Secondary hyperparathyroidism of renal origin: Secondary | ICD-10-CM | POA: Diagnosis not present

## 2014-07-27 DIAGNOSIS — D631 Anemia in chronic kidney disease: Secondary | ICD-10-CM | POA: Diagnosis not present

## 2014-07-29 DIAGNOSIS — N2581 Secondary hyperparathyroidism of renal origin: Secondary | ICD-10-CM | POA: Diagnosis not present

## 2014-07-29 DIAGNOSIS — N186 End stage renal disease: Secondary | ICD-10-CM | POA: Diagnosis not present

## 2014-07-29 DIAGNOSIS — D631 Anemia in chronic kidney disease: Secondary | ICD-10-CM | POA: Diagnosis not present

## 2014-07-29 DIAGNOSIS — D509 Iron deficiency anemia, unspecified: Secondary | ICD-10-CM | POA: Diagnosis not present

## 2014-08-01 DIAGNOSIS — N2581 Secondary hyperparathyroidism of renal origin: Secondary | ICD-10-CM | POA: Diagnosis not present

## 2014-08-01 DIAGNOSIS — D631 Anemia in chronic kidney disease: Secondary | ICD-10-CM | POA: Diagnosis not present

## 2014-08-01 DIAGNOSIS — N186 End stage renal disease: Secondary | ICD-10-CM | POA: Diagnosis not present

## 2014-08-01 DIAGNOSIS — D509 Iron deficiency anemia, unspecified: Secondary | ICD-10-CM | POA: Diagnosis not present

## 2014-08-03 DIAGNOSIS — D509 Iron deficiency anemia, unspecified: Secondary | ICD-10-CM | POA: Diagnosis not present

## 2014-08-03 DIAGNOSIS — D631 Anemia in chronic kidney disease: Secondary | ICD-10-CM | POA: Diagnosis not present

## 2014-08-03 DIAGNOSIS — N186 End stage renal disease: Secondary | ICD-10-CM | POA: Diagnosis not present

## 2014-08-03 DIAGNOSIS — N2581 Secondary hyperparathyroidism of renal origin: Secondary | ICD-10-CM | POA: Diagnosis not present

## 2014-08-05 DIAGNOSIS — N186 End stage renal disease: Secondary | ICD-10-CM | POA: Diagnosis not present

## 2014-08-05 DIAGNOSIS — D631 Anemia in chronic kidney disease: Secondary | ICD-10-CM | POA: Diagnosis not present

## 2014-08-05 DIAGNOSIS — D509 Iron deficiency anemia, unspecified: Secondary | ICD-10-CM | POA: Diagnosis not present

## 2014-08-05 DIAGNOSIS — N2581 Secondary hyperparathyroidism of renal origin: Secondary | ICD-10-CM | POA: Diagnosis not present

## 2014-08-08 DIAGNOSIS — D509 Iron deficiency anemia, unspecified: Secondary | ICD-10-CM | POA: Diagnosis not present

## 2014-08-08 DIAGNOSIS — D631 Anemia in chronic kidney disease: Secondary | ICD-10-CM | POA: Diagnosis not present

## 2014-08-08 DIAGNOSIS — N186 End stage renal disease: Secondary | ICD-10-CM | POA: Diagnosis not present

## 2014-08-08 DIAGNOSIS — N2581 Secondary hyperparathyroidism of renal origin: Secondary | ICD-10-CM | POA: Diagnosis not present

## 2014-08-10 DIAGNOSIS — D631 Anemia in chronic kidney disease: Secondary | ICD-10-CM | POA: Diagnosis not present

## 2014-08-10 DIAGNOSIS — N2581 Secondary hyperparathyroidism of renal origin: Secondary | ICD-10-CM | POA: Diagnosis not present

## 2014-08-10 DIAGNOSIS — D509 Iron deficiency anemia, unspecified: Secondary | ICD-10-CM | POA: Diagnosis not present

## 2014-08-10 DIAGNOSIS — N186 End stage renal disease: Secondary | ICD-10-CM | POA: Diagnosis not present

## 2014-08-12 DIAGNOSIS — N186 End stage renal disease: Secondary | ICD-10-CM | POA: Diagnosis not present

## 2014-08-12 DIAGNOSIS — D631 Anemia in chronic kidney disease: Secondary | ICD-10-CM | POA: Diagnosis not present

## 2014-08-12 DIAGNOSIS — D509 Iron deficiency anemia, unspecified: Secondary | ICD-10-CM | POA: Diagnosis not present

## 2014-08-12 DIAGNOSIS — N2581 Secondary hyperparathyroidism of renal origin: Secondary | ICD-10-CM | POA: Diagnosis not present

## 2014-08-14 DIAGNOSIS — N186 End stage renal disease: Secondary | ICD-10-CM | POA: Diagnosis not present

## 2014-08-14 DIAGNOSIS — Z992 Dependence on renal dialysis: Secondary | ICD-10-CM | POA: Diagnosis not present

## 2014-08-15 DIAGNOSIS — N2581 Secondary hyperparathyroidism of renal origin: Secondary | ICD-10-CM | POA: Diagnosis not present

## 2014-08-15 DIAGNOSIS — D509 Iron deficiency anemia, unspecified: Secondary | ICD-10-CM | POA: Diagnosis not present

## 2014-08-15 DIAGNOSIS — D631 Anemia in chronic kidney disease: Secondary | ICD-10-CM | POA: Diagnosis not present

## 2014-08-15 DIAGNOSIS — N186 End stage renal disease: Secondary | ICD-10-CM | POA: Diagnosis not present

## 2014-08-15 DIAGNOSIS — Z23 Encounter for immunization: Secondary | ICD-10-CM | POA: Diagnosis not present

## 2014-08-17 DIAGNOSIS — D631 Anemia in chronic kidney disease: Secondary | ICD-10-CM | POA: Diagnosis not present

## 2014-08-17 DIAGNOSIS — N2581 Secondary hyperparathyroidism of renal origin: Secondary | ICD-10-CM | POA: Diagnosis not present

## 2014-08-17 DIAGNOSIS — N186 End stage renal disease: Secondary | ICD-10-CM | POA: Diagnosis not present

## 2014-08-17 DIAGNOSIS — Z23 Encounter for immunization: Secondary | ICD-10-CM | POA: Diagnosis not present

## 2014-08-19 DIAGNOSIS — N186 End stage renal disease: Secondary | ICD-10-CM | POA: Diagnosis not present

## 2014-08-19 DIAGNOSIS — D631 Anemia in chronic kidney disease: Secondary | ICD-10-CM | POA: Diagnosis not present

## 2014-08-19 DIAGNOSIS — Z23 Encounter for immunization: Secondary | ICD-10-CM | POA: Diagnosis not present

## 2014-08-19 DIAGNOSIS — N2581 Secondary hyperparathyroidism of renal origin: Secondary | ICD-10-CM | POA: Diagnosis not present

## 2014-08-22 DIAGNOSIS — Z23 Encounter for immunization: Secondary | ICD-10-CM | POA: Diagnosis not present

## 2014-08-22 DIAGNOSIS — N2581 Secondary hyperparathyroidism of renal origin: Secondary | ICD-10-CM | POA: Diagnosis not present

## 2014-08-22 DIAGNOSIS — N186 End stage renal disease: Secondary | ICD-10-CM | POA: Diagnosis not present

## 2014-08-22 DIAGNOSIS — D631 Anemia in chronic kidney disease: Secondary | ICD-10-CM | POA: Diagnosis not present

## 2014-08-24 DIAGNOSIS — N2581 Secondary hyperparathyroidism of renal origin: Secondary | ICD-10-CM | POA: Diagnosis not present

## 2014-08-24 DIAGNOSIS — Z23 Encounter for immunization: Secondary | ICD-10-CM | POA: Diagnosis not present

## 2014-08-24 DIAGNOSIS — N186 End stage renal disease: Secondary | ICD-10-CM | POA: Diagnosis not present

## 2014-08-24 DIAGNOSIS — D631 Anemia in chronic kidney disease: Secondary | ICD-10-CM | POA: Diagnosis not present

## 2014-08-26 DIAGNOSIS — Z23 Encounter for immunization: Secondary | ICD-10-CM | POA: Diagnosis not present

## 2014-08-26 DIAGNOSIS — D631 Anemia in chronic kidney disease: Secondary | ICD-10-CM | POA: Diagnosis not present

## 2014-08-26 DIAGNOSIS — N2581 Secondary hyperparathyroidism of renal origin: Secondary | ICD-10-CM | POA: Diagnosis not present

## 2014-08-26 DIAGNOSIS — N186 End stage renal disease: Secondary | ICD-10-CM | POA: Diagnosis not present

## 2014-08-29 DIAGNOSIS — N186 End stage renal disease: Secondary | ICD-10-CM | POA: Diagnosis not present

## 2014-08-29 DIAGNOSIS — Z23 Encounter for immunization: Secondary | ICD-10-CM | POA: Diagnosis not present

## 2014-08-29 DIAGNOSIS — N2581 Secondary hyperparathyroidism of renal origin: Secondary | ICD-10-CM | POA: Diagnosis not present

## 2014-08-29 DIAGNOSIS — D631 Anemia in chronic kidney disease: Secondary | ICD-10-CM | POA: Diagnosis not present

## 2014-08-30 DIAGNOSIS — Z23 Encounter for immunization: Secondary | ICD-10-CM | POA: Diagnosis not present

## 2014-08-30 DIAGNOSIS — D631 Anemia in chronic kidney disease: Secondary | ICD-10-CM | POA: Diagnosis not present

## 2014-08-30 DIAGNOSIS — N186 End stage renal disease: Secondary | ICD-10-CM | POA: Diagnosis not present

## 2014-08-30 DIAGNOSIS — N2581 Secondary hyperparathyroidism of renal origin: Secondary | ICD-10-CM | POA: Diagnosis not present

## 2014-09-02 DIAGNOSIS — N2581 Secondary hyperparathyroidism of renal origin: Secondary | ICD-10-CM | POA: Diagnosis not present

## 2014-09-02 DIAGNOSIS — D631 Anemia in chronic kidney disease: Secondary | ICD-10-CM | POA: Diagnosis not present

## 2014-09-02 DIAGNOSIS — Z23 Encounter for immunization: Secondary | ICD-10-CM | POA: Diagnosis not present

## 2014-09-02 DIAGNOSIS — N186 End stage renal disease: Secondary | ICD-10-CM | POA: Diagnosis not present

## 2014-09-05 DIAGNOSIS — N186 End stage renal disease: Secondary | ICD-10-CM | POA: Diagnosis not present

## 2014-09-05 DIAGNOSIS — Z23 Encounter for immunization: Secondary | ICD-10-CM | POA: Diagnosis not present

## 2014-09-05 DIAGNOSIS — N2581 Secondary hyperparathyroidism of renal origin: Secondary | ICD-10-CM | POA: Diagnosis not present

## 2014-09-05 DIAGNOSIS — D631 Anemia in chronic kidney disease: Secondary | ICD-10-CM | POA: Diagnosis not present

## 2014-09-07 DIAGNOSIS — D631 Anemia in chronic kidney disease: Secondary | ICD-10-CM | POA: Diagnosis not present

## 2014-09-07 DIAGNOSIS — N2581 Secondary hyperparathyroidism of renal origin: Secondary | ICD-10-CM | POA: Diagnosis not present

## 2014-09-07 DIAGNOSIS — Z23 Encounter for immunization: Secondary | ICD-10-CM | POA: Diagnosis not present

## 2014-09-07 DIAGNOSIS — N186 End stage renal disease: Secondary | ICD-10-CM | POA: Diagnosis not present

## 2014-09-09 DIAGNOSIS — Z23 Encounter for immunization: Secondary | ICD-10-CM | POA: Diagnosis not present

## 2014-09-09 DIAGNOSIS — N2581 Secondary hyperparathyroidism of renal origin: Secondary | ICD-10-CM | POA: Diagnosis not present

## 2014-09-09 DIAGNOSIS — N186 End stage renal disease: Secondary | ICD-10-CM | POA: Diagnosis not present

## 2014-09-09 DIAGNOSIS — D631 Anemia in chronic kidney disease: Secondary | ICD-10-CM | POA: Diagnosis not present

## 2014-09-12 DIAGNOSIS — D631 Anemia in chronic kidney disease: Secondary | ICD-10-CM | POA: Diagnosis not present

## 2014-09-12 DIAGNOSIS — N2581 Secondary hyperparathyroidism of renal origin: Secondary | ICD-10-CM | POA: Diagnosis not present

## 2014-09-12 DIAGNOSIS — Z23 Encounter for immunization: Secondary | ICD-10-CM | POA: Diagnosis not present

## 2014-09-12 DIAGNOSIS — N186 End stage renal disease: Secondary | ICD-10-CM | POA: Diagnosis not present

## 2014-09-14 DIAGNOSIS — N186 End stage renal disease: Secondary | ICD-10-CM | POA: Diagnosis not present

## 2014-09-14 DIAGNOSIS — T8612 Kidney transplant failure: Secondary | ICD-10-CM | POA: Diagnosis not present

## 2014-09-14 DIAGNOSIS — D631 Anemia in chronic kidney disease: Secondary | ICD-10-CM | POA: Diagnosis not present

## 2014-09-14 DIAGNOSIS — N2581 Secondary hyperparathyroidism of renal origin: Secondary | ICD-10-CM | POA: Diagnosis not present

## 2014-09-14 DIAGNOSIS — Z992 Dependence on renal dialysis: Secondary | ICD-10-CM | POA: Diagnosis not present

## 2014-09-14 DIAGNOSIS — Z23 Encounter for immunization: Secondary | ICD-10-CM | POA: Diagnosis not present

## 2014-09-16 DIAGNOSIS — D509 Iron deficiency anemia, unspecified: Secondary | ICD-10-CM | POA: Diagnosis not present

## 2014-09-16 DIAGNOSIS — N186 End stage renal disease: Secondary | ICD-10-CM | POA: Diagnosis not present

## 2014-09-16 DIAGNOSIS — Z23 Encounter for immunization: Secondary | ICD-10-CM | POA: Diagnosis not present

## 2014-09-16 DIAGNOSIS — D631 Anemia in chronic kidney disease: Secondary | ICD-10-CM | POA: Diagnosis not present

## 2014-09-19 DIAGNOSIS — D509 Iron deficiency anemia, unspecified: Secondary | ICD-10-CM | POA: Diagnosis not present

## 2014-09-19 DIAGNOSIS — D631 Anemia in chronic kidney disease: Secondary | ICD-10-CM | POA: Diagnosis not present

## 2014-09-19 DIAGNOSIS — N186 End stage renal disease: Secondary | ICD-10-CM | POA: Diagnosis not present

## 2014-09-19 DIAGNOSIS — Z23 Encounter for immunization: Secondary | ICD-10-CM | POA: Diagnosis not present

## 2014-09-21 DIAGNOSIS — N186 End stage renal disease: Secondary | ICD-10-CM | POA: Diagnosis not present

## 2014-09-21 DIAGNOSIS — Z23 Encounter for immunization: Secondary | ICD-10-CM | POA: Diagnosis not present

## 2014-09-21 DIAGNOSIS — D631 Anemia in chronic kidney disease: Secondary | ICD-10-CM | POA: Diagnosis not present

## 2014-09-21 DIAGNOSIS — D509 Iron deficiency anemia, unspecified: Secondary | ICD-10-CM | POA: Diagnosis not present

## 2014-09-23 DIAGNOSIS — D509 Iron deficiency anemia, unspecified: Secondary | ICD-10-CM | POA: Diagnosis not present

## 2014-09-23 DIAGNOSIS — D631 Anemia in chronic kidney disease: Secondary | ICD-10-CM | POA: Diagnosis not present

## 2014-09-23 DIAGNOSIS — N186 End stage renal disease: Secondary | ICD-10-CM | POA: Diagnosis not present

## 2014-09-23 DIAGNOSIS — Z23 Encounter for immunization: Secondary | ICD-10-CM | POA: Diagnosis not present

## 2014-09-26 DIAGNOSIS — N186 End stage renal disease: Secondary | ICD-10-CM | POA: Diagnosis not present

## 2014-09-26 DIAGNOSIS — D631 Anemia in chronic kidney disease: Secondary | ICD-10-CM | POA: Diagnosis not present

## 2014-09-26 DIAGNOSIS — D509 Iron deficiency anemia, unspecified: Secondary | ICD-10-CM | POA: Diagnosis not present

## 2014-09-26 DIAGNOSIS — Z23 Encounter for immunization: Secondary | ICD-10-CM | POA: Diagnosis not present

## 2014-09-28 DIAGNOSIS — Z23 Encounter for immunization: Secondary | ICD-10-CM | POA: Diagnosis not present

## 2014-09-28 DIAGNOSIS — D509 Iron deficiency anemia, unspecified: Secondary | ICD-10-CM | POA: Diagnosis not present

## 2014-09-28 DIAGNOSIS — N186 End stage renal disease: Secondary | ICD-10-CM | POA: Diagnosis not present

## 2014-09-28 DIAGNOSIS — D631 Anemia in chronic kidney disease: Secondary | ICD-10-CM | POA: Diagnosis not present

## 2014-09-30 DIAGNOSIS — N186 End stage renal disease: Secondary | ICD-10-CM | POA: Diagnosis not present

## 2014-09-30 DIAGNOSIS — Z23 Encounter for immunization: Secondary | ICD-10-CM | POA: Diagnosis not present

## 2014-09-30 DIAGNOSIS — D509 Iron deficiency anemia, unspecified: Secondary | ICD-10-CM | POA: Diagnosis not present

## 2014-09-30 DIAGNOSIS — D631 Anemia in chronic kidney disease: Secondary | ICD-10-CM | POA: Diagnosis not present

## 2014-10-03 DIAGNOSIS — Z23 Encounter for immunization: Secondary | ICD-10-CM | POA: Diagnosis not present

## 2014-10-03 DIAGNOSIS — D631 Anemia in chronic kidney disease: Secondary | ICD-10-CM | POA: Diagnosis not present

## 2014-10-03 DIAGNOSIS — D509 Iron deficiency anemia, unspecified: Secondary | ICD-10-CM | POA: Diagnosis not present

## 2014-10-03 DIAGNOSIS — N186 End stage renal disease: Secondary | ICD-10-CM | POA: Diagnosis not present

## 2014-10-05 DIAGNOSIS — Z23 Encounter for immunization: Secondary | ICD-10-CM | POA: Diagnosis not present

## 2014-10-05 DIAGNOSIS — D509 Iron deficiency anemia, unspecified: Secondary | ICD-10-CM | POA: Diagnosis not present

## 2014-10-05 DIAGNOSIS — D631 Anemia in chronic kidney disease: Secondary | ICD-10-CM | POA: Diagnosis not present

## 2014-10-05 DIAGNOSIS — N186 End stage renal disease: Secondary | ICD-10-CM | POA: Diagnosis not present

## 2014-10-07 DIAGNOSIS — D631 Anemia in chronic kidney disease: Secondary | ICD-10-CM | POA: Diagnosis not present

## 2014-10-07 DIAGNOSIS — D509 Iron deficiency anemia, unspecified: Secondary | ICD-10-CM | POA: Diagnosis not present

## 2014-10-07 DIAGNOSIS — Z23 Encounter for immunization: Secondary | ICD-10-CM | POA: Diagnosis not present

## 2014-10-07 DIAGNOSIS — N186 End stage renal disease: Secondary | ICD-10-CM | POA: Diagnosis not present

## 2014-10-10 DIAGNOSIS — Z23 Encounter for immunization: Secondary | ICD-10-CM | POA: Diagnosis not present

## 2014-10-10 DIAGNOSIS — N186 End stage renal disease: Secondary | ICD-10-CM | POA: Diagnosis not present

## 2014-10-10 DIAGNOSIS — D631 Anemia in chronic kidney disease: Secondary | ICD-10-CM | POA: Diagnosis not present

## 2014-10-10 DIAGNOSIS — D509 Iron deficiency anemia, unspecified: Secondary | ICD-10-CM | POA: Diagnosis not present

## 2014-10-12 DIAGNOSIS — D509 Iron deficiency anemia, unspecified: Secondary | ICD-10-CM | POA: Diagnosis not present

## 2014-10-12 DIAGNOSIS — D631 Anemia in chronic kidney disease: Secondary | ICD-10-CM | POA: Diagnosis not present

## 2014-10-12 DIAGNOSIS — Z23 Encounter for immunization: Secondary | ICD-10-CM | POA: Diagnosis not present

## 2014-10-12 DIAGNOSIS — N186 End stage renal disease: Secondary | ICD-10-CM | POA: Diagnosis not present

## 2014-10-14 DIAGNOSIS — D509 Iron deficiency anemia, unspecified: Secondary | ICD-10-CM | POA: Diagnosis not present

## 2014-10-14 DIAGNOSIS — D631 Anemia in chronic kidney disease: Secondary | ICD-10-CM | POA: Diagnosis not present

## 2014-10-14 DIAGNOSIS — Z992 Dependence on renal dialysis: Secondary | ICD-10-CM | POA: Diagnosis not present

## 2014-10-14 DIAGNOSIS — Z23 Encounter for immunization: Secondary | ICD-10-CM | POA: Diagnosis not present

## 2014-10-14 DIAGNOSIS — T8612 Kidney transplant failure: Secondary | ICD-10-CM | POA: Diagnosis not present

## 2014-10-14 DIAGNOSIS — N186 End stage renal disease: Secondary | ICD-10-CM | POA: Diagnosis not present

## 2014-10-17 DIAGNOSIS — N186 End stage renal disease: Secondary | ICD-10-CM | POA: Diagnosis not present

## 2014-10-17 DIAGNOSIS — D509 Iron deficiency anemia, unspecified: Secondary | ICD-10-CM | POA: Diagnosis not present

## 2014-10-17 DIAGNOSIS — D631 Anemia in chronic kidney disease: Secondary | ICD-10-CM | POA: Diagnosis not present

## 2014-10-17 DIAGNOSIS — N2581 Secondary hyperparathyroidism of renal origin: Secondary | ICD-10-CM | POA: Diagnosis not present

## 2014-10-19 DIAGNOSIS — D631 Anemia in chronic kidney disease: Secondary | ICD-10-CM | POA: Diagnosis not present

## 2014-10-19 DIAGNOSIS — N186 End stage renal disease: Secondary | ICD-10-CM | POA: Diagnosis not present

## 2014-10-19 DIAGNOSIS — N2581 Secondary hyperparathyroidism of renal origin: Secondary | ICD-10-CM | POA: Diagnosis not present

## 2014-10-19 DIAGNOSIS — D509 Iron deficiency anemia, unspecified: Secondary | ICD-10-CM | POA: Diagnosis not present

## 2014-10-21 DIAGNOSIS — N2581 Secondary hyperparathyroidism of renal origin: Secondary | ICD-10-CM | POA: Diagnosis not present

## 2014-10-21 DIAGNOSIS — D631 Anemia in chronic kidney disease: Secondary | ICD-10-CM | POA: Diagnosis not present

## 2014-10-21 DIAGNOSIS — D509 Iron deficiency anemia, unspecified: Secondary | ICD-10-CM | POA: Diagnosis not present

## 2014-10-21 DIAGNOSIS — N186 End stage renal disease: Secondary | ICD-10-CM | POA: Diagnosis not present

## 2014-10-24 DIAGNOSIS — D631 Anemia in chronic kidney disease: Secondary | ICD-10-CM | POA: Diagnosis not present

## 2014-10-24 DIAGNOSIS — N186 End stage renal disease: Secondary | ICD-10-CM | POA: Diagnosis not present

## 2014-10-24 DIAGNOSIS — N2581 Secondary hyperparathyroidism of renal origin: Secondary | ICD-10-CM | POA: Diagnosis not present

## 2014-10-24 DIAGNOSIS — D509 Iron deficiency anemia, unspecified: Secondary | ICD-10-CM | POA: Diagnosis not present

## 2014-10-26 DIAGNOSIS — N186 End stage renal disease: Secondary | ICD-10-CM | POA: Diagnosis not present

## 2014-10-26 DIAGNOSIS — N2581 Secondary hyperparathyroidism of renal origin: Secondary | ICD-10-CM | POA: Diagnosis not present

## 2014-10-26 DIAGNOSIS — D509 Iron deficiency anemia, unspecified: Secondary | ICD-10-CM | POA: Diagnosis not present

## 2014-10-26 DIAGNOSIS — D631 Anemia in chronic kidney disease: Secondary | ICD-10-CM | POA: Diagnosis not present

## 2014-10-28 DIAGNOSIS — N2581 Secondary hyperparathyroidism of renal origin: Secondary | ICD-10-CM | POA: Diagnosis not present

## 2014-10-28 DIAGNOSIS — N186 End stage renal disease: Secondary | ICD-10-CM | POA: Diagnosis not present

## 2014-10-28 DIAGNOSIS — D509 Iron deficiency anemia, unspecified: Secondary | ICD-10-CM | POA: Diagnosis not present

## 2014-10-28 DIAGNOSIS — D631 Anemia in chronic kidney disease: Secondary | ICD-10-CM | POA: Diagnosis not present

## 2014-10-31 DIAGNOSIS — D509 Iron deficiency anemia, unspecified: Secondary | ICD-10-CM | POA: Diagnosis not present

## 2014-10-31 DIAGNOSIS — N2581 Secondary hyperparathyroidism of renal origin: Secondary | ICD-10-CM | POA: Diagnosis not present

## 2014-10-31 DIAGNOSIS — D631 Anemia in chronic kidney disease: Secondary | ICD-10-CM | POA: Diagnosis not present

## 2014-10-31 DIAGNOSIS — N186 End stage renal disease: Secondary | ICD-10-CM | POA: Diagnosis not present

## 2014-11-02 DIAGNOSIS — D509 Iron deficiency anemia, unspecified: Secondary | ICD-10-CM | POA: Diagnosis not present

## 2014-11-02 DIAGNOSIS — N186 End stage renal disease: Secondary | ICD-10-CM | POA: Diagnosis not present

## 2014-11-02 DIAGNOSIS — N2581 Secondary hyperparathyroidism of renal origin: Secondary | ICD-10-CM | POA: Diagnosis not present

## 2014-11-02 DIAGNOSIS — D631 Anemia in chronic kidney disease: Secondary | ICD-10-CM | POA: Diagnosis not present

## 2014-11-04 DIAGNOSIS — D631 Anemia in chronic kidney disease: Secondary | ICD-10-CM | POA: Diagnosis not present

## 2014-11-04 DIAGNOSIS — D509 Iron deficiency anemia, unspecified: Secondary | ICD-10-CM | POA: Diagnosis not present

## 2014-11-04 DIAGNOSIS — N186 End stage renal disease: Secondary | ICD-10-CM | POA: Diagnosis not present

## 2014-11-04 DIAGNOSIS — N2581 Secondary hyperparathyroidism of renal origin: Secondary | ICD-10-CM | POA: Diagnosis not present

## 2014-11-07 DIAGNOSIS — D631 Anemia in chronic kidney disease: Secondary | ICD-10-CM | POA: Diagnosis not present

## 2014-11-07 DIAGNOSIS — N186 End stage renal disease: Secondary | ICD-10-CM | POA: Diagnosis not present

## 2014-11-07 DIAGNOSIS — D509 Iron deficiency anemia, unspecified: Secondary | ICD-10-CM | POA: Diagnosis not present

## 2014-11-07 DIAGNOSIS — N2581 Secondary hyperparathyroidism of renal origin: Secondary | ICD-10-CM | POA: Diagnosis not present

## 2014-11-09 DIAGNOSIS — N2581 Secondary hyperparathyroidism of renal origin: Secondary | ICD-10-CM | POA: Diagnosis not present

## 2014-11-09 DIAGNOSIS — N186 End stage renal disease: Secondary | ICD-10-CM | POA: Diagnosis not present

## 2014-11-09 DIAGNOSIS — D631 Anemia in chronic kidney disease: Secondary | ICD-10-CM | POA: Diagnosis not present

## 2014-11-09 DIAGNOSIS — D509 Iron deficiency anemia, unspecified: Secondary | ICD-10-CM | POA: Diagnosis not present

## 2014-11-11 DIAGNOSIS — N2581 Secondary hyperparathyroidism of renal origin: Secondary | ICD-10-CM | POA: Diagnosis not present

## 2014-11-11 DIAGNOSIS — D509 Iron deficiency anemia, unspecified: Secondary | ICD-10-CM | POA: Diagnosis not present

## 2014-11-11 DIAGNOSIS — D631 Anemia in chronic kidney disease: Secondary | ICD-10-CM | POA: Diagnosis not present

## 2014-11-11 DIAGNOSIS — N186 End stage renal disease: Secondary | ICD-10-CM | POA: Diagnosis not present

## 2014-11-14 DIAGNOSIS — N2581 Secondary hyperparathyroidism of renal origin: Secondary | ICD-10-CM | POA: Diagnosis not present

## 2014-11-14 DIAGNOSIS — D509 Iron deficiency anemia, unspecified: Secondary | ICD-10-CM | POA: Diagnosis not present

## 2014-11-14 DIAGNOSIS — Z992 Dependence on renal dialysis: Secondary | ICD-10-CM | POA: Diagnosis not present

## 2014-11-14 DIAGNOSIS — T8612 Kidney transplant failure: Secondary | ICD-10-CM | POA: Diagnosis not present

## 2014-11-14 DIAGNOSIS — D631 Anemia in chronic kidney disease: Secondary | ICD-10-CM | POA: Diagnosis not present

## 2014-11-14 DIAGNOSIS — N186 End stage renal disease: Secondary | ICD-10-CM | POA: Diagnosis not present

## 2014-11-16 DIAGNOSIS — D631 Anemia in chronic kidney disease: Secondary | ICD-10-CM | POA: Diagnosis not present

## 2014-11-16 DIAGNOSIS — D509 Iron deficiency anemia, unspecified: Secondary | ICD-10-CM | POA: Diagnosis not present

## 2014-11-16 DIAGNOSIS — N2581 Secondary hyperparathyroidism of renal origin: Secondary | ICD-10-CM | POA: Diagnosis not present

## 2014-11-16 DIAGNOSIS — N186 End stage renal disease: Secondary | ICD-10-CM | POA: Diagnosis not present

## 2014-11-18 DIAGNOSIS — D631 Anemia in chronic kidney disease: Secondary | ICD-10-CM | POA: Diagnosis not present

## 2014-11-18 DIAGNOSIS — D509 Iron deficiency anemia, unspecified: Secondary | ICD-10-CM | POA: Diagnosis not present

## 2014-11-18 DIAGNOSIS — N186 End stage renal disease: Secondary | ICD-10-CM | POA: Diagnosis not present

## 2014-11-18 DIAGNOSIS — N2581 Secondary hyperparathyroidism of renal origin: Secondary | ICD-10-CM | POA: Diagnosis not present

## 2014-11-21 DIAGNOSIS — N2581 Secondary hyperparathyroidism of renal origin: Secondary | ICD-10-CM | POA: Diagnosis not present

## 2014-11-21 DIAGNOSIS — D631 Anemia in chronic kidney disease: Secondary | ICD-10-CM | POA: Diagnosis not present

## 2014-11-21 DIAGNOSIS — N186 End stage renal disease: Secondary | ICD-10-CM | POA: Diagnosis not present

## 2014-11-21 DIAGNOSIS — D509 Iron deficiency anemia, unspecified: Secondary | ICD-10-CM | POA: Diagnosis not present

## 2014-11-23 DIAGNOSIS — N2581 Secondary hyperparathyroidism of renal origin: Secondary | ICD-10-CM | POA: Diagnosis not present

## 2014-11-23 DIAGNOSIS — D509 Iron deficiency anemia, unspecified: Secondary | ICD-10-CM | POA: Diagnosis not present

## 2014-11-23 DIAGNOSIS — D631 Anemia in chronic kidney disease: Secondary | ICD-10-CM | POA: Diagnosis not present

## 2014-11-23 DIAGNOSIS — N186 End stage renal disease: Secondary | ICD-10-CM | POA: Diagnosis not present

## 2014-11-25 DIAGNOSIS — N2581 Secondary hyperparathyroidism of renal origin: Secondary | ICD-10-CM | POA: Diagnosis not present

## 2014-11-25 DIAGNOSIS — D631 Anemia in chronic kidney disease: Secondary | ICD-10-CM | POA: Diagnosis not present

## 2014-11-25 DIAGNOSIS — N186 End stage renal disease: Secondary | ICD-10-CM | POA: Diagnosis not present

## 2014-11-25 DIAGNOSIS — D509 Iron deficiency anemia, unspecified: Secondary | ICD-10-CM | POA: Diagnosis not present

## 2014-11-28 DIAGNOSIS — N2581 Secondary hyperparathyroidism of renal origin: Secondary | ICD-10-CM | POA: Diagnosis not present

## 2014-11-28 DIAGNOSIS — D631 Anemia in chronic kidney disease: Secondary | ICD-10-CM | POA: Diagnosis not present

## 2014-11-28 DIAGNOSIS — N186 End stage renal disease: Secondary | ICD-10-CM | POA: Diagnosis not present

## 2014-11-28 DIAGNOSIS — D509 Iron deficiency anemia, unspecified: Secondary | ICD-10-CM | POA: Diagnosis not present

## 2014-11-30 DIAGNOSIS — N2581 Secondary hyperparathyroidism of renal origin: Secondary | ICD-10-CM | POA: Diagnosis not present

## 2014-11-30 DIAGNOSIS — N186 End stage renal disease: Secondary | ICD-10-CM | POA: Diagnosis not present

## 2014-11-30 DIAGNOSIS — D509 Iron deficiency anemia, unspecified: Secondary | ICD-10-CM | POA: Diagnosis not present

## 2014-11-30 DIAGNOSIS — D631 Anemia in chronic kidney disease: Secondary | ICD-10-CM | POA: Diagnosis not present

## 2014-12-02 DIAGNOSIS — D509 Iron deficiency anemia, unspecified: Secondary | ICD-10-CM | POA: Diagnosis not present

## 2014-12-02 DIAGNOSIS — N2581 Secondary hyperparathyroidism of renal origin: Secondary | ICD-10-CM | POA: Diagnosis not present

## 2014-12-02 DIAGNOSIS — N186 End stage renal disease: Secondary | ICD-10-CM | POA: Diagnosis not present

## 2014-12-02 DIAGNOSIS — D631 Anemia in chronic kidney disease: Secondary | ICD-10-CM | POA: Diagnosis not present

## 2014-12-05 DIAGNOSIS — D631 Anemia in chronic kidney disease: Secondary | ICD-10-CM | POA: Diagnosis not present

## 2014-12-05 DIAGNOSIS — N2581 Secondary hyperparathyroidism of renal origin: Secondary | ICD-10-CM | POA: Diagnosis not present

## 2014-12-05 DIAGNOSIS — N186 End stage renal disease: Secondary | ICD-10-CM | POA: Diagnosis not present

## 2014-12-05 DIAGNOSIS — D509 Iron deficiency anemia, unspecified: Secondary | ICD-10-CM | POA: Diagnosis not present

## 2014-12-07 DIAGNOSIS — D631 Anemia in chronic kidney disease: Secondary | ICD-10-CM | POA: Diagnosis not present

## 2014-12-07 DIAGNOSIS — N186 End stage renal disease: Secondary | ICD-10-CM | POA: Diagnosis not present

## 2014-12-07 DIAGNOSIS — N2581 Secondary hyperparathyroidism of renal origin: Secondary | ICD-10-CM | POA: Diagnosis not present

## 2014-12-07 DIAGNOSIS — D509 Iron deficiency anemia, unspecified: Secondary | ICD-10-CM | POA: Diagnosis not present

## 2014-12-09 DIAGNOSIS — D631 Anemia in chronic kidney disease: Secondary | ICD-10-CM | POA: Diagnosis not present

## 2014-12-09 DIAGNOSIS — N2581 Secondary hyperparathyroidism of renal origin: Secondary | ICD-10-CM | POA: Diagnosis not present

## 2014-12-09 DIAGNOSIS — D509 Iron deficiency anemia, unspecified: Secondary | ICD-10-CM | POA: Diagnosis not present

## 2014-12-09 DIAGNOSIS — N186 End stage renal disease: Secondary | ICD-10-CM | POA: Diagnosis not present

## 2014-12-12 DIAGNOSIS — D509 Iron deficiency anemia, unspecified: Secondary | ICD-10-CM | POA: Diagnosis not present

## 2014-12-12 DIAGNOSIS — D631 Anemia in chronic kidney disease: Secondary | ICD-10-CM | POA: Diagnosis not present

## 2014-12-12 DIAGNOSIS — N186 End stage renal disease: Secondary | ICD-10-CM | POA: Diagnosis not present

## 2014-12-12 DIAGNOSIS — N2581 Secondary hyperparathyroidism of renal origin: Secondary | ICD-10-CM | POA: Diagnosis not present

## 2014-12-14 DIAGNOSIS — N2581 Secondary hyperparathyroidism of renal origin: Secondary | ICD-10-CM | POA: Diagnosis not present

## 2014-12-14 DIAGNOSIS — N186 End stage renal disease: Secondary | ICD-10-CM | POA: Diagnosis not present

## 2014-12-14 DIAGNOSIS — T8612 Kidney transplant failure: Secondary | ICD-10-CM | POA: Diagnosis not present

## 2014-12-14 DIAGNOSIS — D631 Anemia in chronic kidney disease: Secondary | ICD-10-CM | POA: Diagnosis not present

## 2014-12-14 DIAGNOSIS — Z992 Dependence on renal dialysis: Secondary | ICD-10-CM | POA: Diagnosis not present

## 2014-12-14 DIAGNOSIS — D509 Iron deficiency anemia, unspecified: Secondary | ICD-10-CM | POA: Diagnosis not present

## 2014-12-16 DIAGNOSIS — N186 End stage renal disease: Secondary | ICD-10-CM | POA: Diagnosis not present

## 2014-12-16 DIAGNOSIS — D509 Iron deficiency anemia, unspecified: Secondary | ICD-10-CM | POA: Diagnosis not present

## 2014-12-16 DIAGNOSIS — D631 Anemia in chronic kidney disease: Secondary | ICD-10-CM | POA: Diagnosis not present

## 2014-12-16 DIAGNOSIS — N2581 Secondary hyperparathyroidism of renal origin: Secondary | ICD-10-CM | POA: Diagnosis not present

## 2014-12-19 DIAGNOSIS — D631 Anemia in chronic kidney disease: Secondary | ICD-10-CM | POA: Diagnosis not present

## 2014-12-19 DIAGNOSIS — N186 End stage renal disease: Secondary | ICD-10-CM | POA: Diagnosis not present

## 2014-12-19 DIAGNOSIS — D509 Iron deficiency anemia, unspecified: Secondary | ICD-10-CM | POA: Diagnosis not present

## 2014-12-19 DIAGNOSIS — N2581 Secondary hyperparathyroidism of renal origin: Secondary | ICD-10-CM | POA: Diagnosis not present

## 2014-12-21 DIAGNOSIS — D509 Iron deficiency anemia, unspecified: Secondary | ICD-10-CM | POA: Diagnosis not present

## 2014-12-21 DIAGNOSIS — D631 Anemia in chronic kidney disease: Secondary | ICD-10-CM | POA: Diagnosis not present

## 2014-12-21 DIAGNOSIS — N186 End stage renal disease: Secondary | ICD-10-CM | POA: Diagnosis not present

## 2014-12-21 DIAGNOSIS — N2581 Secondary hyperparathyroidism of renal origin: Secondary | ICD-10-CM | POA: Diagnosis not present

## 2014-12-23 DIAGNOSIS — N2581 Secondary hyperparathyroidism of renal origin: Secondary | ICD-10-CM | POA: Diagnosis not present

## 2014-12-23 DIAGNOSIS — D509 Iron deficiency anemia, unspecified: Secondary | ICD-10-CM | POA: Diagnosis not present

## 2014-12-23 DIAGNOSIS — D631 Anemia in chronic kidney disease: Secondary | ICD-10-CM | POA: Diagnosis not present

## 2014-12-23 DIAGNOSIS — N186 End stage renal disease: Secondary | ICD-10-CM | POA: Diagnosis not present

## 2014-12-26 DIAGNOSIS — D631 Anemia in chronic kidney disease: Secondary | ICD-10-CM | POA: Diagnosis not present

## 2014-12-26 DIAGNOSIS — N2581 Secondary hyperparathyroidism of renal origin: Secondary | ICD-10-CM | POA: Diagnosis not present

## 2014-12-26 DIAGNOSIS — N186 End stage renal disease: Secondary | ICD-10-CM | POA: Diagnosis not present

## 2014-12-26 DIAGNOSIS — D509 Iron deficiency anemia, unspecified: Secondary | ICD-10-CM | POA: Diagnosis not present

## 2014-12-28 DIAGNOSIS — N186 End stage renal disease: Secondary | ICD-10-CM | POA: Diagnosis not present

## 2014-12-28 DIAGNOSIS — D631 Anemia in chronic kidney disease: Secondary | ICD-10-CM | POA: Diagnosis not present

## 2014-12-28 DIAGNOSIS — D509 Iron deficiency anemia, unspecified: Secondary | ICD-10-CM | POA: Diagnosis not present

## 2014-12-28 DIAGNOSIS — N2581 Secondary hyperparathyroidism of renal origin: Secondary | ICD-10-CM | POA: Diagnosis not present

## 2014-12-30 DIAGNOSIS — N186 End stage renal disease: Secondary | ICD-10-CM | POA: Diagnosis not present

## 2014-12-30 DIAGNOSIS — D631 Anemia in chronic kidney disease: Secondary | ICD-10-CM | POA: Diagnosis not present

## 2014-12-30 DIAGNOSIS — N2581 Secondary hyperparathyroidism of renal origin: Secondary | ICD-10-CM | POA: Diagnosis not present

## 2014-12-30 DIAGNOSIS — D509 Iron deficiency anemia, unspecified: Secondary | ICD-10-CM | POA: Diagnosis not present

## 2015-01-02 DIAGNOSIS — N186 End stage renal disease: Secondary | ICD-10-CM | POA: Diagnosis not present

## 2015-01-02 DIAGNOSIS — D509 Iron deficiency anemia, unspecified: Secondary | ICD-10-CM | POA: Diagnosis not present

## 2015-01-02 DIAGNOSIS — N2581 Secondary hyperparathyroidism of renal origin: Secondary | ICD-10-CM | POA: Diagnosis not present

## 2015-01-02 DIAGNOSIS — D631 Anemia in chronic kidney disease: Secondary | ICD-10-CM | POA: Diagnosis not present

## 2015-01-04 DIAGNOSIS — N2581 Secondary hyperparathyroidism of renal origin: Secondary | ICD-10-CM | POA: Diagnosis not present

## 2015-01-04 DIAGNOSIS — N186 End stage renal disease: Secondary | ICD-10-CM | POA: Diagnosis not present

## 2015-01-04 DIAGNOSIS — D509 Iron deficiency anemia, unspecified: Secondary | ICD-10-CM | POA: Diagnosis not present

## 2015-01-04 DIAGNOSIS — D631 Anemia in chronic kidney disease: Secondary | ICD-10-CM | POA: Diagnosis not present

## 2015-01-06 DIAGNOSIS — N2581 Secondary hyperparathyroidism of renal origin: Secondary | ICD-10-CM | POA: Diagnosis not present

## 2015-01-06 DIAGNOSIS — N186 End stage renal disease: Secondary | ICD-10-CM | POA: Diagnosis not present

## 2015-01-06 DIAGNOSIS — D631 Anemia in chronic kidney disease: Secondary | ICD-10-CM | POA: Diagnosis not present

## 2015-01-06 DIAGNOSIS — D509 Iron deficiency anemia, unspecified: Secondary | ICD-10-CM | POA: Diagnosis not present

## 2015-01-09 DIAGNOSIS — N2581 Secondary hyperparathyroidism of renal origin: Secondary | ICD-10-CM | POA: Diagnosis not present

## 2015-01-09 DIAGNOSIS — N186 End stage renal disease: Secondary | ICD-10-CM | POA: Diagnosis not present

## 2015-01-09 DIAGNOSIS — D631 Anemia in chronic kidney disease: Secondary | ICD-10-CM | POA: Diagnosis not present

## 2015-01-09 DIAGNOSIS — D509 Iron deficiency anemia, unspecified: Secondary | ICD-10-CM | POA: Diagnosis not present

## 2015-01-11 DIAGNOSIS — D631 Anemia in chronic kidney disease: Secondary | ICD-10-CM | POA: Diagnosis not present

## 2015-01-11 DIAGNOSIS — D509 Iron deficiency anemia, unspecified: Secondary | ICD-10-CM | POA: Diagnosis not present

## 2015-01-11 DIAGNOSIS — N186 End stage renal disease: Secondary | ICD-10-CM | POA: Diagnosis not present

## 2015-01-11 DIAGNOSIS — N2581 Secondary hyperparathyroidism of renal origin: Secondary | ICD-10-CM | POA: Diagnosis not present

## 2015-01-13 DIAGNOSIS — N2581 Secondary hyperparathyroidism of renal origin: Secondary | ICD-10-CM | POA: Diagnosis not present

## 2015-01-13 DIAGNOSIS — D631 Anemia in chronic kidney disease: Secondary | ICD-10-CM | POA: Diagnosis not present

## 2015-01-13 DIAGNOSIS — D509 Iron deficiency anemia, unspecified: Secondary | ICD-10-CM | POA: Diagnosis not present

## 2015-01-13 DIAGNOSIS — N186 End stage renal disease: Secondary | ICD-10-CM | POA: Diagnosis not present

## 2015-01-14 DIAGNOSIS — Z992 Dependence on renal dialysis: Secondary | ICD-10-CM | POA: Diagnosis not present

## 2015-01-14 DIAGNOSIS — T8612 Kidney transplant failure: Secondary | ICD-10-CM | POA: Diagnosis not present

## 2015-01-14 DIAGNOSIS — N186 End stage renal disease: Secondary | ICD-10-CM | POA: Diagnosis not present

## 2015-01-16 DIAGNOSIS — D631 Anemia in chronic kidney disease: Secondary | ICD-10-CM | POA: Diagnosis not present

## 2015-01-16 DIAGNOSIS — N186 End stage renal disease: Secondary | ICD-10-CM | POA: Diagnosis not present

## 2015-01-16 DIAGNOSIS — D509 Iron deficiency anemia, unspecified: Secondary | ICD-10-CM | POA: Diagnosis not present

## 2015-01-16 DIAGNOSIS — N2581 Secondary hyperparathyroidism of renal origin: Secondary | ICD-10-CM | POA: Diagnosis not present

## 2015-01-18 DIAGNOSIS — D631 Anemia in chronic kidney disease: Secondary | ICD-10-CM | POA: Diagnosis not present

## 2015-01-18 DIAGNOSIS — N2581 Secondary hyperparathyroidism of renal origin: Secondary | ICD-10-CM | POA: Diagnosis not present

## 2015-01-18 DIAGNOSIS — N186 End stage renal disease: Secondary | ICD-10-CM | POA: Diagnosis not present

## 2015-01-18 DIAGNOSIS — D509 Iron deficiency anemia, unspecified: Secondary | ICD-10-CM | POA: Diagnosis not present

## 2015-01-20 DIAGNOSIS — D509 Iron deficiency anemia, unspecified: Secondary | ICD-10-CM | POA: Diagnosis not present

## 2015-01-20 DIAGNOSIS — N186 End stage renal disease: Secondary | ICD-10-CM | POA: Diagnosis not present

## 2015-01-20 DIAGNOSIS — D631 Anemia in chronic kidney disease: Secondary | ICD-10-CM | POA: Diagnosis not present

## 2015-01-20 DIAGNOSIS — N2581 Secondary hyperparathyroidism of renal origin: Secondary | ICD-10-CM | POA: Diagnosis not present

## 2015-01-23 DIAGNOSIS — N186 End stage renal disease: Secondary | ICD-10-CM | POA: Diagnosis not present

## 2015-01-23 DIAGNOSIS — N2581 Secondary hyperparathyroidism of renal origin: Secondary | ICD-10-CM | POA: Diagnosis not present

## 2015-01-23 DIAGNOSIS — D509 Iron deficiency anemia, unspecified: Secondary | ICD-10-CM | POA: Diagnosis not present

## 2015-01-23 DIAGNOSIS — D631 Anemia in chronic kidney disease: Secondary | ICD-10-CM | POA: Diagnosis not present

## 2015-01-25 DIAGNOSIS — D509 Iron deficiency anemia, unspecified: Secondary | ICD-10-CM | POA: Diagnosis not present

## 2015-01-25 DIAGNOSIS — N186 End stage renal disease: Secondary | ICD-10-CM | POA: Diagnosis not present

## 2015-01-25 DIAGNOSIS — N2581 Secondary hyperparathyroidism of renal origin: Secondary | ICD-10-CM | POA: Diagnosis not present

## 2015-01-25 DIAGNOSIS — D631 Anemia in chronic kidney disease: Secondary | ICD-10-CM | POA: Diagnosis not present

## 2015-01-27 DIAGNOSIS — D509 Iron deficiency anemia, unspecified: Secondary | ICD-10-CM | POA: Diagnosis not present

## 2015-01-27 DIAGNOSIS — N2581 Secondary hyperparathyroidism of renal origin: Secondary | ICD-10-CM | POA: Diagnosis not present

## 2015-01-27 DIAGNOSIS — N186 End stage renal disease: Secondary | ICD-10-CM | POA: Diagnosis not present

## 2015-01-27 DIAGNOSIS — D631 Anemia in chronic kidney disease: Secondary | ICD-10-CM | POA: Diagnosis not present

## 2015-01-30 DIAGNOSIS — N2581 Secondary hyperparathyroidism of renal origin: Secondary | ICD-10-CM | POA: Diagnosis not present

## 2015-01-30 DIAGNOSIS — D509 Iron deficiency anemia, unspecified: Secondary | ICD-10-CM | POA: Diagnosis not present

## 2015-01-30 DIAGNOSIS — D631 Anemia in chronic kidney disease: Secondary | ICD-10-CM | POA: Diagnosis not present

## 2015-01-30 DIAGNOSIS — N186 End stage renal disease: Secondary | ICD-10-CM | POA: Diagnosis not present

## 2015-02-01 DIAGNOSIS — N186 End stage renal disease: Secondary | ICD-10-CM | POA: Diagnosis not present

## 2015-02-01 DIAGNOSIS — N2581 Secondary hyperparathyroidism of renal origin: Secondary | ICD-10-CM | POA: Diagnosis not present

## 2015-02-01 DIAGNOSIS — D509 Iron deficiency anemia, unspecified: Secondary | ICD-10-CM | POA: Diagnosis not present

## 2015-02-01 DIAGNOSIS — D631 Anemia in chronic kidney disease: Secondary | ICD-10-CM | POA: Diagnosis not present

## 2015-02-03 DIAGNOSIS — D509 Iron deficiency anemia, unspecified: Secondary | ICD-10-CM | POA: Diagnosis not present

## 2015-02-03 DIAGNOSIS — N2581 Secondary hyperparathyroidism of renal origin: Secondary | ICD-10-CM | POA: Diagnosis not present

## 2015-02-03 DIAGNOSIS — N186 End stage renal disease: Secondary | ICD-10-CM | POA: Diagnosis not present

## 2015-02-03 DIAGNOSIS — D631 Anemia in chronic kidney disease: Secondary | ICD-10-CM | POA: Diagnosis not present

## 2015-02-06 DIAGNOSIS — D631 Anemia in chronic kidney disease: Secondary | ICD-10-CM | POA: Diagnosis not present

## 2015-02-06 DIAGNOSIS — D509 Iron deficiency anemia, unspecified: Secondary | ICD-10-CM | POA: Diagnosis not present

## 2015-02-06 DIAGNOSIS — N186 End stage renal disease: Secondary | ICD-10-CM | POA: Diagnosis not present

## 2015-02-06 DIAGNOSIS — N2581 Secondary hyperparathyroidism of renal origin: Secondary | ICD-10-CM | POA: Diagnosis not present

## 2015-02-08 DIAGNOSIS — D631 Anemia in chronic kidney disease: Secondary | ICD-10-CM | POA: Diagnosis not present

## 2015-02-08 DIAGNOSIS — D509 Iron deficiency anemia, unspecified: Secondary | ICD-10-CM | POA: Diagnosis not present

## 2015-02-08 DIAGNOSIS — N2581 Secondary hyperparathyroidism of renal origin: Secondary | ICD-10-CM | POA: Diagnosis not present

## 2015-02-08 DIAGNOSIS — N186 End stage renal disease: Secondary | ICD-10-CM | POA: Diagnosis not present

## 2015-02-10 DIAGNOSIS — N186 End stage renal disease: Secondary | ICD-10-CM | POA: Diagnosis not present

## 2015-02-10 DIAGNOSIS — D631 Anemia in chronic kidney disease: Secondary | ICD-10-CM | POA: Diagnosis not present

## 2015-02-10 DIAGNOSIS — N2581 Secondary hyperparathyroidism of renal origin: Secondary | ICD-10-CM | POA: Diagnosis not present

## 2015-02-10 DIAGNOSIS — D509 Iron deficiency anemia, unspecified: Secondary | ICD-10-CM | POA: Diagnosis not present

## 2015-02-13 DIAGNOSIS — D509 Iron deficiency anemia, unspecified: Secondary | ICD-10-CM | POA: Diagnosis not present

## 2015-02-13 DIAGNOSIS — D631 Anemia in chronic kidney disease: Secondary | ICD-10-CM | POA: Diagnosis not present

## 2015-02-13 DIAGNOSIS — N186 End stage renal disease: Secondary | ICD-10-CM | POA: Diagnosis not present

## 2015-02-13 DIAGNOSIS — N2581 Secondary hyperparathyroidism of renal origin: Secondary | ICD-10-CM | POA: Diagnosis not present

## 2015-02-14 DIAGNOSIS — Z992 Dependence on renal dialysis: Secondary | ICD-10-CM | POA: Diagnosis not present

## 2015-02-14 DIAGNOSIS — T8612 Kidney transplant failure: Secondary | ICD-10-CM | POA: Diagnosis not present

## 2015-02-14 DIAGNOSIS — N186 End stage renal disease: Secondary | ICD-10-CM | POA: Diagnosis not present

## 2015-02-15 DIAGNOSIS — N186 End stage renal disease: Secondary | ICD-10-CM | POA: Diagnosis not present

## 2015-02-15 DIAGNOSIS — D509 Iron deficiency anemia, unspecified: Secondary | ICD-10-CM | POA: Diagnosis not present

## 2015-02-15 DIAGNOSIS — N2581 Secondary hyperparathyroidism of renal origin: Secondary | ICD-10-CM | POA: Diagnosis not present

## 2015-02-15 DIAGNOSIS — D631 Anemia in chronic kidney disease: Secondary | ICD-10-CM | POA: Diagnosis not present

## 2015-02-17 DIAGNOSIS — D509 Iron deficiency anemia, unspecified: Secondary | ICD-10-CM | POA: Diagnosis not present

## 2015-02-17 DIAGNOSIS — D631 Anemia in chronic kidney disease: Secondary | ICD-10-CM | POA: Diagnosis not present

## 2015-02-17 DIAGNOSIS — N186 End stage renal disease: Secondary | ICD-10-CM | POA: Diagnosis not present

## 2015-02-17 DIAGNOSIS — N2581 Secondary hyperparathyroidism of renal origin: Secondary | ICD-10-CM | POA: Diagnosis not present

## 2015-02-20 DIAGNOSIS — N2581 Secondary hyperparathyroidism of renal origin: Secondary | ICD-10-CM | POA: Diagnosis not present

## 2015-02-20 DIAGNOSIS — D509 Iron deficiency anemia, unspecified: Secondary | ICD-10-CM | POA: Diagnosis not present

## 2015-02-20 DIAGNOSIS — N186 End stage renal disease: Secondary | ICD-10-CM | POA: Diagnosis not present

## 2015-02-20 DIAGNOSIS — D631 Anemia in chronic kidney disease: Secondary | ICD-10-CM | POA: Diagnosis not present

## 2015-02-22 DIAGNOSIS — D631 Anemia in chronic kidney disease: Secondary | ICD-10-CM | POA: Diagnosis not present

## 2015-02-22 DIAGNOSIS — N2581 Secondary hyperparathyroidism of renal origin: Secondary | ICD-10-CM | POA: Diagnosis not present

## 2015-02-22 DIAGNOSIS — N186 End stage renal disease: Secondary | ICD-10-CM | POA: Diagnosis not present

## 2015-02-22 DIAGNOSIS — D509 Iron deficiency anemia, unspecified: Secondary | ICD-10-CM | POA: Diagnosis not present

## 2015-02-24 DIAGNOSIS — N186 End stage renal disease: Secondary | ICD-10-CM | POA: Diagnosis not present

## 2015-02-24 DIAGNOSIS — N2581 Secondary hyperparathyroidism of renal origin: Secondary | ICD-10-CM | POA: Diagnosis not present

## 2015-02-24 DIAGNOSIS — D509 Iron deficiency anemia, unspecified: Secondary | ICD-10-CM | POA: Diagnosis not present

## 2015-02-24 DIAGNOSIS — D631 Anemia in chronic kidney disease: Secondary | ICD-10-CM | POA: Diagnosis not present

## 2015-02-27 DIAGNOSIS — N2581 Secondary hyperparathyroidism of renal origin: Secondary | ICD-10-CM | POA: Diagnosis not present

## 2015-02-27 DIAGNOSIS — D631 Anemia in chronic kidney disease: Secondary | ICD-10-CM | POA: Diagnosis not present

## 2015-02-27 DIAGNOSIS — N186 End stage renal disease: Secondary | ICD-10-CM | POA: Diagnosis not present

## 2015-02-27 DIAGNOSIS — D509 Iron deficiency anemia, unspecified: Secondary | ICD-10-CM | POA: Diagnosis not present

## 2015-03-01 DIAGNOSIS — D509 Iron deficiency anemia, unspecified: Secondary | ICD-10-CM | POA: Diagnosis not present

## 2015-03-01 DIAGNOSIS — N186 End stage renal disease: Secondary | ICD-10-CM | POA: Diagnosis not present

## 2015-03-01 DIAGNOSIS — D631 Anemia in chronic kidney disease: Secondary | ICD-10-CM | POA: Diagnosis not present

## 2015-03-01 DIAGNOSIS — N2581 Secondary hyperparathyroidism of renal origin: Secondary | ICD-10-CM | POA: Diagnosis not present

## 2015-03-03 DIAGNOSIS — N2581 Secondary hyperparathyroidism of renal origin: Secondary | ICD-10-CM | POA: Diagnosis not present

## 2015-03-03 DIAGNOSIS — D509 Iron deficiency anemia, unspecified: Secondary | ICD-10-CM | POA: Diagnosis not present

## 2015-03-03 DIAGNOSIS — D631 Anemia in chronic kidney disease: Secondary | ICD-10-CM | POA: Diagnosis not present

## 2015-03-03 DIAGNOSIS — N186 End stage renal disease: Secondary | ICD-10-CM | POA: Diagnosis not present

## 2015-03-06 DIAGNOSIS — D509 Iron deficiency anemia, unspecified: Secondary | ICD-10-CM | POA: Diagnosis not present

## 2015-03-06 DIAGNOSIS — N186 End stage renal disease: Secondary | ICD-10-CM | POA: Diagnosis not present

## 2015-03-06 DIAGNOSIS — D631 Anemia in chronic kidney disease: Secondary | ICD-10-CM | POA: Diagnosis not present

## 2015-03-06 DIAGNOSIS — N2581 Secondary hyperparathyroidism of renal origin: Secondary | ICD-10-CM | POA: Diagnosis not present

## 2015-03-08 DIAGNOSIS — N186 End stage renal disease: Secondary | ICD-10-CM | POA: Diagnosis not present

## 2015-03-08 DIAGNOSIS — N2581 Secondary hyperparathyroidism of renal origin: Secondary | ICD-10-CM | POA: Diagnosis not present

## 2015-03-08 DIAGNOSIS — D509 Iron deficiency anemia, unspecified: Secondary | ICD-10-CM | POA: Diagnosis not present

## 2015-03-08 DIAGNOSIS — D631 Anemia in chronic kidney disease: Secondary | ICD-10-CM | POA: Diagnosis not present

## 2015-03-10 DIAGNOSIS — N186 End stage renal disease: Secondary | ICD-10-CM | POA: Diagnosis not present

## 2015-03-10 DIAGNOSIS — D631 Anemia in chronic kidney disease: Secondary | ICD-10-CM | POA: Diagnosis not present

## 2015-03-10 DIAGNOSIS — D509 Iron deficiency anemia, unspecified: Secondary | ICD-10-CM | POA: Diagnosis not present

## 2015-03-10 DIAGNOSIS — N2581 Secondary hyperparathyroidism of renal origin: Secondary | ICD-10-CM | POA: Diagnosis not present

## 2015-03-13 DIAGNOSIS — D509 Iron deficiency anemia, unspecified: Secondary | ICD-10-CM | POA: Diagnosis not present

## 2015-03-13 DIAGNOSIS — N186 End stage renal disease: Secondary | ICD-10-CM | POA: Diagnosis not present

## 2015-03-13 DIAGNOSIS — D631 Anemia in chronic kidney disease: Secondary | ICD-10-CM | POA: Diagnosis not present

## 2015-03-13 DIAGNOSIS — N2581 Secondary hyperparathyroidism of renal origin: Secondary | ICD-10-CM | POA: Diagnosis not present

## 2015-03-15 DIAGNOSIS — D631 Anemia in chronic kidney disease: Secondary | ICD-10-CM | POA: Diagnosis not present

## 2015-03-15 DIAGNOSIS — N186 End stage renal disease: Secondary | ICD-10-CM | POA: Diagnosis not present

## 2015-03-15 DIAGNOSIS — N2581 Secondary hyperparathyroidism of renal origin: Secondary | ICD-10-CM | POA: Diagnosis not present

## 2015-03-15 DIAGNOSIS — D509 Iron deficiency anemia, unspecified: Secondary | ICD-10-CM | POA: Diagnosis not present

## 2015-03-16 DIAGNOSIS — T8612 Kidney transplant failure: Secondary | ICD-10-CM | POA: Diagnosis not present

## 2015-03-16 DIAGNOSIS — Z992 Dependence on renal dialysis: Secondary | ICD-10-CM | POA: Diagnosis not present

## 2015-03-16 DIAGNOSIS — N186 End stage renal disease: Secondary | ICD-10-CM | POA: Diagnosis not present

## 2015-03-17 DIAGNOSIS — N186 End stage renal disease: Secondary | ICD-10-CM | POA: Diagnosis not present

## 2015-03-17 DIAGNOSIS — D509 Iron deficiency anemia, unspecified: Secondary | ICD-10-CM | POA: Diagnosis not present

## 2015-03-17 DIAGNOSIS — D631 Anemia in chronic kidney disease: Secondary | ICD-10-CM | POA: Diagnosis not present

## 2015-03-17 DIAGNOSIS — N2581 Secondary hyperparathyroidism of renal origin: Secondary | ICD-10-CM | POA: Diagnosis not present

## 2015-03-17 DIAGNOSIS — Z23 Encounter for immunization: Secondary | ICD-10-CM | POA: Diagnosis not present

## 2015-03-20 DIAGNOSIS — N186 End stage renal disease: Secondary | ICD-10-CM | POA: Diagnosis not present

## 2015-03-20 DIAGNOSIS — N2581 Secondary hyperparathyroidism of renal origin: Secondary | ICD-10-CM | POA: Diagnosis not present

## 2015-03-20 DIAGNOSIS — Z23 Encounter for immunization: Secondary | ICD-10-CM | POA: Diagnosis not present

## 2015-03-20 DIAGNOSIS — D631 Anemia in chronic kidney disease: Secondary | ICD-10-CM | POA: Diagnosis not present

## 2015-03-20 DIAGNOSIS — D509 Iron deficiency anemia, unspecified: Secondary | ICD-10-CM | POA: Diagnosis not present

## 2015-03-23 DIAGNOSIS — D509 Iron deficiency anemia, unspecified: Secondary | ICD-10-CM | POA: Diagnosis not present

## 2015-03-23 DIAGNOSIS — Z23 Encounter for immunization: Secondary | ICD-10-CM | POA: Diagnosis not present

## 2015-03-23 DIAGNOSIS — D631 Anemia in chronic kidney disease: Secondary | ICD-10-CM | POA: Diagnosis not present

## 2015-03-23 DIAGNOSIS — N2581 Secondary hyperparathyroidism of renal origin: Secondary | ICD-10-CM | POA: Diagnosis not present

## 2015-03-23 DIAGNOSIS — N186 End stage renal disease: Secondary | ICD-10-CM | POA: Diagnosis not present

## 2015-03-24 DIAGNOSIS — N2581 Secondary hyperparathyroidism of renal origin: Secondary | ICD-10-CM | POA: Diagnosis not present

## 2015-03-24 DIAGNOSIS — D631 Anemia in chronic kidney disease: Secondary | ICD-10-CM | POA: Diagnosis not present

## 2015-03-24 DIAGNOSIS — Z23 Encounter for immunization: Secondary | ICD-10-CM | POA: Diagnosis not present

## 2015-03-24 DIAGNOSIS — N186 End stage renal disease: Secondary | ICD-10-CM | POA: Diagnosis not present

## 2015-03-24 DIAGNOSIS — D509 Iron deficiency anemia, unspecified: Secondary | ICD-10-CM | POA: Diagnosis not present

## 2015-03-27 DIAGNOSIS — N2581 Secondary hyperparathyroidism of renal origin: Secondary | ICD-10-CM | POA: Diagnosis not present

## 2015-03-27 DIAGNOSIS — Z23 Encounter for immunization: Secondary | ICD-10-CM | POA: Diagnosis not present

## 2015-03-27 DIAGNOSIS — N186 End stage renal disease: Secondary | ICD-10-CM | POA: Diagnosis not present

## 2015-03-27 DIAGNOSIS — D509 Iron deficiency anemia, unspecified: Secondary | ICD-10-CM | POA: Diagnosis not present

## 2015-03-27 DIAGNOSIS — D631 Anemia in chronic kidney disease: Secondary | ICD-10-CM | POA: Diagnosis not present

## 2015-03-29 DIAGNOSIS — D631 Anemia in chronic kidney disease: Secondary | ICD-10-CM | POA: Diagnosis not present

## 2015-03-29 DIAGNOSIS — N186 End stage renal disease: Secondary | ICD-10-CM | POA: Diagnosis not present

## 2015-03-29 DIAGNOSIS — N2581 Secondary hyperparathyroidism of renal origin: Secondary | ICD-10-CM | POA: Diagnosis not present

## 2015-03-29 DIAGNOSIS — D509 Iron deficiency anemia, unspecified: Secondary | ICD-10-CM | POA: Diagnosis not present

## 2015-03-29 DIAGNOSIS — Z23 Encounter for immunization: Secondary | ICD-10-CM | POA: Diagnosis not present

## 2015-03-31 DIAGNOSIS — D631 Anemia in chronic kidney disease: Secondary | ICD-10-CM | POA: Diagnosis not present

## 2015-03-31 DIAGNOSIS — D509 Iron deficiency anemia, unspecified: Secondary | ICD-10-CM | POA: Diagnosis not present

## 2015-03-31 DIAGNOSIS — N186 End stage renal disease: Secondary | ICD-10-CM | POA: Diagnosis not present

## 2015-03-31 DIAGNOSIS — N2581 Secondary hyperparathyroidism of renal origin: Secondary | ICD-10-CM | POA: Diagnosis not present

## 2015-03-31 DIAGNOSIS — Z23 Encounter for immunization: Secondary | ICD-10-CM | POA: Diagnosis not present

## 2015-04-03 DIAGNOSIS — D509 Iron deficiency anemia, unspecified: Secondary | ICD-10-CM | POA: Diagnosis not present

## 2015-04-03 DIAGNOSIS — N186 End stage renal disease: Secondary | ICD-10-CM | POA: Diagnosis not present

## 2015-04-03 DIAGNOSIS — Z23 Encounter for immunization: Secondary | ICD-10-CM | POA: Diagnosis not present

## 2015-04-03 DIAGNOSIS — D631 Anemia in chronic kidney disease: Secondary | ICD-10-CM | POA: Diagnosis not present

## 2015-04-03 DIAGNOSIS — N2581 Secondary hyperparathyroidism of renal origin: Secondary | ICD-10-CM | POA: Diagnosis not present

## 2015-04-05 DIAGNOSIS — D631 Anemia in chronic kidney disease: Secondary | ICD-10-CM | POA: Diagnosis not present

## 2015-04-05 DIAGNOSIS — N2581 Secondary hyperparathyroidism of renal origin: Secondary | ICD-10-CM | POA: Diagnosis not present

## 2015-04-05 DIAGNOSIS — Z23 Encounter for immunization: Secondary | ICD-10-CM | POA: Diagnosis not present

## 2015-04-05 DIAGNOSIS — N186 End stage renal disease: Secondary | ICD-10-CM | POA: Diagnosis not present

## 2015-04-05 DIAGNOSIS — D509 Iron deficiency anemia, unspecified: Secondary | ICD-10-CM | POA: Diagnosis not present

## 2015-04-07 DIAGNOSIS — Z23 Encounter for immunization: Secondary | ICD-10-CM | POA: Diagnosis not present

## 2015-04-07 DIAGNOSIS — N186 End stage renal disease: Secondary | ICD-10-CM | POA: Diagnosis not present

## 2015-04-07 DIAGNOSIS — N2581 Secondary hyperparathyroidism of renal origin: Secondary | ICD-10-CM | POA: Diagnosis not present

## 2015-04-07 DIAGNOSIS — D509 Iron deficiency anemia, unspecified: Secondary | ICD-10-CM | POA: Diagnosis not present

## 2015-04-07 DIAGNOSIS — D631 Anemia in chronic kidney disease: Secondary | ICD-10-CM | POA: Diagnosis not present

## 2015-04-10 DIAGNOSIS — Z23 Encounter for immunization: Secondary | ICD-10-CM | POA: Diagnosis not present

## 2015-04-10 DIAGNOSIS — D509 Iron deficiency anemia, unspecified: Secondary | ICD-10-CM | POA: Diagnosis not present

## 2015-04-10 DIAGNOSIS — D631 Anemia in chronic kidney disease: Secondary | ICD-10-CM | POA: Diagnosis not present

## 2015-04-10 DIAGNOSIS — N2581 Secondary hyperparathyroidism of renal origin: Secondary | ICD-10-CM | POA: Diagnosis not present

## 2015-04-10 DIAGNOSIS — N186 End stage renal disease: Secondary | ICD-10-CM | POA: Diagnosis not present

## 2015-04-12 DIAGNOSIS — N186 End stage renal disease: Secondary | ICD-10-CM | POA: Diagnosis not present

## 2015-04-12 DIAGNOSIS — D509 Iron deficiency anemia, unspecified: Secondary | ICD-10-CM | POA: Diagnosis not present

## 2015-04-12 DIAGNOSIS — N2581 Secondary hyperparathyroidism of renal origin: Secondary | ICD-10-CM | POA: Diagnosis not present

## 2015-04-12 DIAGNOSIS — D631 Anemia in chronic kidney disease: Secondary | ICD-10-CM | POA: Diagnosis not present

## 2015-04-12 DIAGNOSIS — Z23 Encounter for immunization: Secondary | ICD-10-CM | POA: Diagnosis not present

## 2015-04-14 DIAGNOSIS — D631 Anemia in chronic kidney disease: Secondary | ICD-10-CM | POA: Diagnosis not present

## 2015-04-14 DIAGNOSIS — N2581 Secondary hyperparathyroidism of renal origin: Secondary | ICD-10-CM | POA: Diagnosis not present

## 2015-04-14 DIAGNOSIS — D509 Iron deficiency anemia, unspecified: Secondary | ICD-10-CM | POA: Diagnosis not present

## 2015-04-14 DIAGNOSIS — Z23 Encounter for immunization: Secondary | ICD-10-CM | POA: Diagnosis not present

## 2015-04-14 DIAGNOSIS — N186 End stage renal disease: Secondary | ICD-10-CM | POA: Diagnosis not present

## 2015-04-16 DIAGNOSIS — Z992 Dependence on renal dialysis: Secondary | ICD-10-CM | POA: Diagnosis not present

## 2015-04-16 DIAGNOSIS — T8612 Kidney transplant failure: Secondary | ICD-10-CM | POA: Diagnosis not present

## 2015-04-16 DIAGNOSIS — N186 End stage renal disease: Secondary | ICD-10-CM | POA: Diagnosis not present

## 2015-04-17 DIAGNOSIS — D631 Anemia in chronic kidney disease: Secondary | ICD-10-CM | POA: Diagnosis not present

## 2015-04-17 DIAGNOSIS — D509 Iron deficiency anemia, unspecified: Secondary | ICD-10-CM | POA: Diagnosis not present

## 2015-04-17 DIAGNOSIS — N2581 Secondary hyperparathyroidism of renal origin: Secondary | ICD-10-CM | POA: Diagnosis not present

## 2015-04-17 DIAGNOSIS — N186 End stage renal disease: Secondary | ICD-10-CM | POA: Diagnosis not present

## 2015-04-19 DIAGNOSIS — D631 Anemia in chronic kidney disease: Secondary | ICD-10-CM | POA: Diagnosis not present

## 2015-04-19 DIAGNOSIS — N2581 Secondary hyperparathyroidism of renal origin: Secondary | ICD-10-CM | POA: Diagnosis not present

## 2015-04-19 DIAGNOSIS — N186 End stage renal disease: Secondary | ICD-10-CM | POA: Diagnosis not present

## 2015-04-19 DIAGNOSIS — D509 Iron deficiency anemia, unspecified: Secondary | ICD-10-CM | POA: Diagnosis not present

## 2015-04-21 DIAGNOSIS — N186 End stage renal disease: Secondary | ICD-10-CM | POA: Diagnosis not present

## 2015-04-21 DIAGNOSIS — N2581 Secondary hyperparathyroidism of renal origin: Secondary | ICD-10-CM | POA: Diagnosis not present

## 2015-04-21 DIAGNOSIS — D631 Anemia in chronic kidney disease: Secondary | ICD-10-CM | POA: Diagnosis not present

## 2015-04-21 DIAGNOSIS — D509 Iron deficiency anemia, unspecified: Secondary | ICD-10-CM | POA: Diagnosis not present

## 2015-04-24 DIAGNOSIS — N2581 Secondary hyperparathyroidism of renal origin: Secondary | ICD-10-CM | POA: Diagnosis not present

## 2015-04-24 DIAGNOSIS — D631 Anemia in chronic kidney disease: Secondary | ICD-10-CM | POA: Diagnosis not present

## 2015-04-24 DIAGNOSIS — N186 End stage renal disease: Secondary | ICD-10-CM | POA: Diagnosis not present

## 2015-04-24 DIAGNOSIS — D509 Iron deficiency anemia, unspecified: Secondary | ICD-10-CM | POA: Diagnosis not present

## 2015-04-26 DIAGNOSIS — D631 Anemia in chronic kidney disease: Secondary | ICD-10-CM | POA: Diagnosis not present

## 2015-04-26 DIAGNOSIS — N186 End stage renal disease: Secondary | ICD-10-CM | POA: Diagnosis not present

## 2015-04-26 DIAGNOSIS — D509 Iron deficiency anemia, unspecified: Secondary | ICD-10-CM | POA: Diagnosis not present

## 2015-04-26 DIAGNOSIS — N2581 Secondary hyperparathyroidism of renal origin: Secondary | ICD-10-CM | POA: Diagnosis not present

## 2015-04-28 DIAGNOSIS — N186 End stage renal disease: Secondary | ICD-10-CM | POA: Diagnosis not present

## 2015-04-28 DIAGNOSIS — N2581 Secondary hyperparathyroidism of renal origin: Secondary | ICD-10-CM | POA: Diagnosis not present

## 2015-04-28 DIAGNOSIS — D509 Iron deficiency anemia, unspecified: Secondary | ICD-10-CM | POA: Diagnosis not present

## 2015-04-28 DIAGNOSIS — D631 Anemia in chronic kidney disease: Secondary | ICD-10-CM | POA: Diagnosis not present

## 2015-05-01 DIAGNOSIS — D509 Iron deficiency anemia, unspecified: Secondary | ICD-10-CM | POA: Diagnosis not present

## 2015-05-01 DIAGNOSIS — N2581 Secondary hyperparathyroidism of renal origin: Secondary | ICD-10-CM | POA: Diagnosis not present

## 2015-05-01 DIAGNOSIS — D631 Anemia in chronic kidney disease: Secondary | ICD-10-CM | POA: Diagnosis not present

## 2015-05-01 DIAGNOSIS — N186 End stage renal disease: Secondary | ICD-10-CM | POA: Diagnosis not present

## 2015-05-03 DIAGNOSIS — D509 Iron deficiency anemia, unspecified: Secondary | ICD-10-CM | POA: Diagnosis not present

## 2015-05-03 DIAGNOSIS — N2581 Secondary hyperparathyroidism of renal origin: Secondary | ICD-10-CM | POA: Diagnosis not present

## 2015-05-03 DIAGNOSIS — N186 End stage renal disease: Secondary | ICD-10-CM | POA: Diagnosis not present

## 2015-05-03 DIAGNOSIS — D631 Anemia in chronic kidney disease: Secondary | ICD-10-CM | POA: Diagnosis not present

## 2015-05-05 DIAGNOSIS — D509 Iron deficiency anemia, unspecified: Secondary | ICD-10-CM | POA: Diagnosis not present

## 2015-05-05 DIAGNOSIS — D631 Anemia in chronic kidney disease: Secondary | ICD-10-CM | POA: Diagnosis not present

## 2015-05-05 DIAGNOSIS — N2581 Secondary hyperparathyroidism of renal origin: Secondary | ICD-10-CM | POA: Diagnosis not present

## 2015-05-05 DIAGNOSIS — N186 End stage renal disease: Secondary | ICD-10-CM | POA: Diagnosis not present

## 2015-05-08 DIAGNOSIS — D509 Iron deficiency anemia, unspecified: Secondary | ICD-10-CM | POA: Diagnosis not present

## 2015-05-08 DIAGNOSIS — N2581 Secondary hyperparathyroidism of renal origin: Secondary | ICD-10-CM | POA: Diagnosis not present

## 2015-05-08 DIAGNOSIS — D631 Anemia in chronic kidney disease: Secondary | ICD-10-CM | POA: Diagnosis not present

## 2015-05-08 DIAGNOSIS — N186 End stage renal disease: Secondary | ICD-10-CM | POA: Diagnosis not present

## 2015-05-11 DIAGNOSIS — N186 End stage renal disease: Secondary | ICD-10-CM | POA: Diagnosis not present

## 2015-05-11 DIAGNOSIS — N2581 Secondary hyperparathyroidism of renal origin: Secondary | ICD-10-CM | POA: Diagnosis not present

## 2015-05-11 DIAGNOSIS — D631 Anemia in chronic kidney disease: Secondary | ICD-10-CM | POA: Diagnosis not present

## 2015-05-11 DIAGNOSIS — D509 Iron deficiency anemia, unspecified: Secondary | ICD-10-CM | POA: Diagnosis not present

## 2015-05-13 DIAGNOSIS — D631 Anemia in chronic kidney disease: Secondary | ICD-10-CM | POA: Diagnosis not present

## 2015-05-13 DIAGNOSIS — N186 End stage renal disease: Secondary | ICD-10-CM | POA: Diagnosis not present

## 2015-05-13 DIAGNOSIS — D509 Iron deficiency anemia, unspecified: Secondary | ICD-10-CM | POA: Diagnosis not present

## 2015-05-13 DIAGNOSIS — N2581 Secondary hyperparathyroidism of renal origin: Secondary | ICD-10-CM | POA: Diagnosis not present

## 2015-05-15 DIAGNOSIS — N186 End stage renal disease: Secondary | ICD-10-CM | POA: Diagnosis not present

## 2015-05-15 DIAGNOSIS — D509 Iron deficiency anemia, unspecified: Secondary | ICD-10-CM | POA: Diagnosis not present

## 2015-05-15 DIAGNOSIS — N2581 Secondary hyperparathyroidism of renal origin: Secondary | ICD-10-CM | POA: Diagnosis not present

## 2015-05-15 DIAGNOSIS — D631 Anemia in chronic kidney disease: Secondary | ICD-10-CM | POA: Diagnosis not present

## 2015-05-16 DIAGNOSIS — Z992 Dependence on renal dialysis: Secondary | ICD-10-CM | POA: Diagnosis not present

## 2015-05-16 DIAGNOSIS — N186 End stage renal disease: Secondary | ICD-10-CM | POA: Diagnosis not present

## 2015-05-16 DIAGNOSIS — T8612 Kidney transplant failure: Secondary | ICD-10-CM | POA: Diagnosis not present

## 2015-05-17 DIAGNOSIS — D509 Iron deficiency anemia, unspecified: Secondary | ICD-10-CM | POA: Diagnosis not present

## 2015-05-17 DIAGNOSIS — N186 End stage renal disease: Secondary | ICD-10-CM | POA: Diagnosis not present

## 2015-05-17 DIAGNOSIS — D631 Anemia in chronic kidney disease: Secondary | ICD-10-CM | POA: Diagnosis not present

## 2015-05-17 DIAGNOSIS — N2581 Secondary hyperparathyroidism of renal origin: Secondary | ICD-10-CM | POA: Diagnosis not present

## 2015-05-19 DIAGNOSIS — N186 End stage renal disease: Secondary | ICD-10-CM | POA: Diagnosis not present

## 2015-05-19 DIAGNOSIS — D509 Iron deficiency anemia, unspecified: Secondary | ICD-10-CM | POA: Diagnosis not present

## 2015-05-19 DIAGNOSIS — N2581 Secondary hyperparathyroidism of renal origin: Secondary | ICD-10-CM | POA: Diagnosis not present

## 2015-05-19 DIAGNOSIS — D631 Anemia in chronic kidney disease: Secondary | ICD-10-CM | POA: Diagnosis not present

## 2015-05-22 DIAGNOSIS — N2581 Secondary hyperparathyroidism of renal origin: Secondary | ICD-10-CM | POA: Diagnosis not present

## 2015-05-22 DIAGNOSIS — D631 Anemia in chronic kidney disease: Secondary | ICD-10-CM | POA: Diagnosis not present

## 2015-05-22 DIAGNOSIS — D509 Iron deficiency anemia, unspecified: Secondary | ICD-10-CM | POA: Diagnosis not present

## 2015-05-22 DIAGNOSIS — N186 End stage renal disease: Secondary | ICD-10-CM | POA: Diagnosis not present

## 2015-05-24 DIAGNOSIS — N2581 Secondary hyperparathyroidism of renal origin: Secondary | ICD-10-CM | POA: Diagnosis not present

## 2015-05-24 DIAGNOSIS — D631 Anemia in chronic kidney disease: Secondary | ICD-10-CM | POA: Diagnosis not present

## 2015-05-24 DIAGNOSIS — N186 End stage renal disease: Secondary | ICD-10-CM | POA: Diagnosis not present

## 2015-05-24 DIAGNOSIS — D509 Iron deficiency anemia, unspecified: Secondary | ICD-10-CM | POA: Diagnosis not present

## 2015-05-26 DIAGNOSIS — D631 Anemia in chronic kidney disease: Secondary | ICD-10-CM | POA: Diagnosis not present

## 2015-05-26 DIAGNOSIS — N2581 Secondary hyperparathyroidism of renal origin: Secondary | ICD-10-CM | POA: Diagnosis not present

## 2015-05-26 DIAGNOSIS — D509 Iron deficiency anemia, unspecified: Secondary | ICD-10-CM | POA: Diagnosis not present

## 2015-05-26 DIAGNOSIS — N186 End stage renal disease: Secondary | ICD-10-CM | POA: Diagnosis not present

## 2015-05-29 DIAGNOSIS — D631 Anemia in chronic kidney disease: Secondary | ICD-10-CM | POA: Diagnosis not present

## 2015-05-29 DIAGNOSIS — N186 End stage renal disease: Secondary | ICD-10-CM | POA: Diagnosis not present

## 2015-05-29 DIAGNOSIS — D509 Iron deficiency anemia, unspecified: Secondary | ICD-10-CM | POA: Diagnosis not present

## 2015-05-29 DIAGNOSIS — N2581 Secondary hyperparathyroidism of renal origin: Secondary | ICD-10-CM | POA: Diagnosis not present

## 2015-05-31 DIAGNOSIS — D509 Iron deficiency anemia, unspecified: Secondary | ICD-10-CM | POA: Diagnosis not present

## 2015-05-31 DIAGNOSIS — D631 Anemia in chronic kidney disease: Secondary | ICD-10-CM | POA: Diagnosis not present

## 2015-05-31 DIAGNOSIS — N186 End stage renal disease: Secondary | ICD-10-CM | POA: Diagnosis not present

## 2015-05-31 DIAGNOSIS — N2581 Secondary hyperparathyroidism of renal origin: Secondary | ICD-10-CM | POA: Diagnosis not present

## 2015-06-02 DIAGNOSIS — N186 End stage renal disease: Secondary | ICD-10-CM | POA: Diagnosis not present

## 2015-06-02 DIAGNOSIS — D631 Anemia in chronic kidney disease: Secondary | ICD-10-CM | POA: Diagnosis not present

## 2015-06-02 DIAGNOSIS — N2581 Secondary hyperparathyroidism of renal origin: Secondary | ICD-10-CM | POA: Diagnosis not present

## 2015-06-02 DIAGNOSIS — D509 Iron deficiency anemia, unspecified: Secondary | ICD-10-CM | POA: Diagnosis not present

## 2015-06-05 DIAGNOSIS — N186 End stage renal disease: Secondary | ICD-10-CM | POA: Diagnosis not present

## 2015-06-05 DIAGNOSIS — D631 Anemia in chronic kidney disease: Secondary | ICD-10-CM | POA: Diagnosis not present

## 2015-06-05 DIAGNOSIS — N2581 Secondary hyperparathyroidism of renal origin: Secondary | ICD-10-CM | POA: Diagnosis not present

## 2015-06-05 DIAGNOSIS — D509 Iron deficiency anemia, unspecified: Secondary | ICD-10-CM | POA: Diagnosis not present

## 2015-06-07 DIAGNOSIS — D509 Iron deficiency anemia, unspecified: Secondary | ICD-10-CM | POA: Diagnosis not present

## 2015-06-07 DIAGNOSIS — N2581 Secondary hyperparathyroidism of renal origin: Secondary | ICD-10-CM | POA: Diagnosis not present

## 2015-06-07 DIAGNOSIS — D631 Anemia in chronic kidney disease: Secondary | ICD-10-CM | POA: Diagnosis not present

## 2015-06-07 DIAGNOSIS — N186 End stage renal disease: Secondary | ICD-10-CM | POA: Diagnosis not present

## 2015-06-09 DIAGNOSIS — D509 Iron deficiency anemia, unspecified: Secondary | ICD-10-CM | POA: Diagnosis not present

## 2015-06-09 DIAGNOSIS — N186 End stage renal disease: Secondary | ICD-10-CM | POA: Diagnosis not present

## 2015-06-09 DIAGNOSIS — D631 Anemia in chronic kidney disease: Secondary | ICD-10-CM | POA: Diagnosis not present

## 2015-06-09 DIAGNOSIS — N2581 Secondary hyperparathyroidism of renal origin: Secondary | ICD-10-CM | POA: Diagnosis not present

## 2015-06-12 DIAGNOSIS — D631 Anemia in chronic kidney disease: Secondary | ICD-10-CM | POA: Diagnosis not present

## 2015-06-12 DIAGNOSIS — D509 Iron deficiency anemia, unspecified: Secondary | ICD-10-CM | POA: Diagnosis not present

## 2015-06-12 DIAGNOSIS — N2581 Secondary hyperparathyroidism of renal origin: Secondary | ICD-10-CM | POA: Diagnosis not present

## 2015-06-12 DIAGNOSIS — N186 End stage renal disease: Secondary | ICD-10-CM | POA: Diagnosis not present

## 2015-06-14 DIAGNOSIS — N2581 Secondary hyperparathyroidism of renal origin: Secondary | ICD-10-CM | POA: Diagnosis not present

## 2015-06-14 DIAGNOSIS — D631 Anemia in chronic kidney disease: Secondary | ICD-10-CM | POA: Diagnosis not present

## 2015-06-14 DIAGNOSIS — N186 End stage renal disease: Secondary | ICD-10-CM | POA: Diagnosis not present

## 2015-06-14 DIAGNOSIS — D509 Iron deficiency anemia, unspecified: Secondary | ICD-10-CM | POA: Diagnosis not present

## 2015-06-16 DIAGNOSIS — N2581 Secondary hyperparathyroidism of renal origin: Secondary | ICD-10-CM | POA: Diagnosis not present

## 2015-06-16 DIAGNOSIS — D509 Iron deficiency anemia, unspecified: Secondary | ICD-10-CM | POA: Diagnosis not present

## 2015-06-16 DIAGNOSIS — N186 End stage renal disease: Secondary | ICD-10-CM | POA: Diagnosis not present

## 2015-06-16 DIAGNOSIS — T8612 Kidney transplant failure: Secondary | ICD-10-CM | POA: Diagnosis not present

## 2015-06-16 DIAGNOSIS — D631 Anemia in chronic kidney disease: Secondary | ICD-10-CM | POA: Diagnosis not present

## 2015-06-16 DIAGNOSIS — Z992 Dependence on renal dialysis: Secondary | ICD-10-CM | POA: Diagnosis not present

## 2015-06-19 DIAGNOSIS — D631 Anemia in chronic kidney disease: Secondary | ICD-10-CM | POA: Diagnosis not present

## 2015-06-19 DIAGNOSIS — N186 End stage renal disease: Secondary | ICD-10-CM | POA: Diagnosis not present

## 2015-06-19 DIAGNOSIS — N2581 Secondary hyperparathyroidism of renal origin: Secondary | ICD-10-CM | POA: Diagnosis not present

## 2015-06-19 DIAGNOSIS — D509 Iron deficiency anemia, unspecified: Secondary | ICD-10-CM | POA: Diagnosis not present

## 2015-06-21 DIAGNOSIS — D631 Anemia in chronic kidney disease: Secondary | ICD-10-CM | POA: Diagnosis not present

## 2015-06-21 DIAGNOSIS — N186 End stage renal disease: Secondary | ICD-10-CM | POA: Diagnosis not present

## 2015-06-21 DIAGNOSIS — D509 Iron deficiency anemia, unspecified: Secondary | ICD-10-CM | POA: Diagnosis not present

## 2015-06-21 DIAGNOSIS — N2581 Secondary hyperparathyroidism of renal origin: Secondary | ICD-10-CM | POA: Diagnosis not present

## 2015-06-23 DIAGNOSIS — N2581 Secondary hyperparathyroidism of renal origin: Secondary | ICD-10-CM | POA: Diagnosis not present

## 2015-06-23 DIAGNOSIS — D631 Anemia in chronic kidney disease: Secondary | ICD-10-CM | POA: Diagnosis not present

## 2015-06-23 DIAGNOSIS — N186 End stage renal disease: Secondary | ICD-10-CM | POA: Diagnosis not present

## 2015-06-23 DIAGNOSIS — D509 Iron deficiency anemia, unspecified: Secondary | ICD-10-CM | POA: Diagnosis not present

## 2015-06-26 DIAGNOSIS — N186 End stage renal disease: Secondary | ICD-10-CM | POA: Diagnosis not present

## 2015-06-26 DIAGNOSIS — D631 Anemia in chronic kidney disease: Secondary | ICD-10-CM | POA: Diagnosis not present

## 2015-06-26 DIAGNOSIS — D509 Iron deficiency anemia, unspecified: Secondary | ICD-10-CM | POA: Diagnosis not present

## 2015-06-26 DIAGNOSIS — N2581 Secondary hyperparathyroidism of renal origin: Secondary | ICD-10-CM | POA: Diagnosis not present

## 2015-06-28 DIAGNOSIS — N2581 Secondary hyperparathyroidism of renal origin: Secondary | ICD-10-CM | POA: Diagnosis not present

## 2015-06-28 DIAGNOSIS — D631 Anemia in chronic kidney disease: Secondary | ICD-10-CM | POA: Diagnosis not present

## 2015-06-28 DIAGNOSIS — D509 Iron deficiency anemia, unspecified: Secondary | ICD-10-CM | POA: Diagnosis not present

## 2015-06-28 DIAGNOSIS — N186 End stage renal disease: Secondary | ICD-10-CM | POA: Diagnosis not present

## 2015-06-30 DIAGNOSIS — D509 Iron deficiency anemia, unspecified: Secondary | ICD-10-CM | POA: Diagnosis not present

## 2015-06-30 DIAGNOSIS — N186 End stage renal disease: Secondary | ICD-10-CM | POA: Diagnosis not present

## 2015-06-30 DIAGNOSIS — N2581 Secondary hyperparathyroidism of renal origin: Secondary | ICD-10-CM | POA: Diagnosis not present

## 2015-06-30 DIAGNOSIS — D631 Anemia in chronic kidney disease: Secondary | ICD-10-CM | POA: Diagnosis not present

## 2015-07-03 DIAGNOSIS — N2581 Secondary hyperparathyroidism of renal origin: Secondary | ICD-10-CM | POA: Diagnosis not present

## 2015-07-03 DIAGNOSIS — N186 End stage renal disease: Secondary | ICD-10-CM | POA: Diagnosis not present

## 2015-07-03 DIAGNOSIS — D509 Iron deficiency anemia, unspecified: Secondary | ICD-10-CM | POA: Diagnosis not present

## 2015-07-03 DIAGNOSIS — D631 Anemia in chronic kidney disease: Secondary | ICD-10-CM | POA: Diagnosis not present

## 2015-07-05 DIAGNOSIS — D509 Iron deficiency anemia, unspecified: Secondary | ICD-10-CM | POA: Diagnosis not present

## 2015-07-05 DIAGNOSIS — N2581 Secondary hyperparathyroidism of renal origin: Secondary | ICD-10-CM | POA: Diagnosis not present

## 2015-07-05 DIAGNOSIS — N186 End stage renal disease: Secondary | ICD-10-CM | POA: Diagnosis not present

## 2015-07-05 DIAGNOSIS — D631 Anemia in chronic kidney disease: Secondary | ICD-10-CM | POA: Diagnosis not present

## 2015-07-07 DIAGNOSIS — N186 End stage renal disease: Secondary | ICD-10-CM | POA: Diagnosis not present

## 2015-07-07 DIAGNOSIS — D631 Anemia in chronic kidney disease: Secondary | ICD-10-CM | POA: Diagnosis not present

## 2015-07-07 DIAGNOSIS — D509 Iron deficiency anemia, unspecified: Secondary | ICD-10-CM | POA: Diagnosis not present

## 2015-07-07 DIAGNOSIS — N2581 Secondary hyperparathyroidism of renal origin: Secondary | ICD-10-CM | POA: Diagnosis not present

## 2015-07-10 DIAGNOSIS — N2581 Secondary hyperparathyroidism of renal origin: Secondary | ICD-10-CM | POA: Diagnosis not present

## 2015-07-10 DIAGNOSIS — N186 End stage renal disease: Secondary | ICD-10-CM | POA: Diagnosis not present

## 2015-07-10 DIAGNOSIS — D509 Iron deficiency anemia, unspecified: Secondary | ICD-10-CM | POA: Diagnosis not present

## 2015-07-10 DIAGNOSIS — D631 Anemia in chronic kidney disease: Secondary | ICD-10-CM | POA: Diagnosis not present

## 2015-07-12 DIAGNOSIS — D509 Iron deficiency anemia, unspecified: Secondary | ICD-10-CM | POA: Diagnosis not present

## 2015-07-12 DIAGNOSIS — N186 End stage renal disease: Secondary | ICD-10-CM | POA: Diagnosis not present

## 2015-07-12 DIAGNOSIS — D631 Anemia in chronic kidney disease: Secondary | ICD-10-CM | POA: Diagnosis not present

## 2015-07-12 DIAGNOSIS — N2581 Secondary hyperparathyroidism of renal origin: Secondary | ICD-10-CM | POA: Diagnosis not present

## 2015-07-14 DIAGNOSIS — N186 End stage renal disease: Secondary | ICD-10-CM | POA: Diagnosis not present

## 2015-07-14 DIAGNOSIS — N2581 Secondary hyperparathyroidism of renal origin: Secondary | ICD-10-CM | POA: Diagnosis not present

## 2015-07-14 DIAGNOSIS — D631 Anemia in chronic kidney disease: Secondary | ICD-10-CM | POA: Diagnosis not present

## 2015-07-14 DIAGNOSIS — D509 Iron deficiency anemia, unspecified: Secondary | ICD-10-CM | POA: Diagnosis not present

## 2015-07-17 DIAGNOSIS — D509 Iron deficiency anemia, unspecified: Secondary | ICD-10-CM | POA: Diagnosis not present

## 2015-07-17 DIAGNOSIS — N186 End stage renal disease: Secondary | ICD-10-CM | POA: Diagnosis not present

## 2015-07-17 DIAGNOSIS — Z992 Dependence on renal dialysis: Secondary | ICD-10-CM | POA: Diagnosis not present

## 2015-07-17 DIAGNOSIS — T8612 Kidney transplant failure: Secondary | ICD-10-CM | POA: Diagnosis not present

## 2015-07-17 DIAGNOSIS — N2581 Secondary hyperparathyroidism of renal origin: Secondary | ICD-10-CM | POA: Diagnosis not present

## 2015-07-17 DIAGNOSIS — D631 Anemia in chronic kidney disease: Secondary | ICD-10-CM | POA: Diagnosis not present

## 2015-07-19 DIAGNOSIS — N186 End stage renal disease: Secondary | ICD-10-CM | POA: Diagnosis not present

## 2015-07-19 DIAGNOSIS — D509 Iron deficiency anemia, unspecified: Secondary | ICD-10-CM | POA: Diagnosis not present

## 2015-07-19 DIAGNOSIS — D631 Anemia in chronic kidney disease: Secondary | ICD-10-CM | POA: Diagnosis not present

## 2015-07-19 DIAGNOSIS — N2581 Secondary hyperparathyroidism of renal origin: Secondary | ICD-10-CM | POA: Diagnosis not present

## 2015-07-21 DIAGNOSIS — N2581 Secondary hyperparathyroidism of renal origin: Secondary | ICD-10-CM | POA: Diagnosis not present

## 2015-07-21 DIAGNOSIS — N186 End stage renal disease: Secondary | ICD-10-CM | POA: Diagnosis not present

## 2015-07-21 DIAGNOSIS — D509 Iron deficiency anemia, unspecified: Secondary | ICD-10-CM | POA: Diagnosis not present

## 2015-07-21 DIAGNOSIS — D631 Anemia in chronic kidney disease: Secondary | ICD-10-CM | POA: Diagnosis not present

## 2015-07-24 DIAGNOSIS — D631 Anemia in chronic kidney disease: Secondary | ICD-10-CM | POA: Diagnosis not present

## 2015-07-24 DIAGNOSIS — N186 End stage renal disease: Secondary | ICD-10-CM | POA: Diagnosis not present

## 2015-07-24 DIAGNOSIS — N2581 Secondary hyperparathyroidism of renal origin: Secondary | ICD-10-CM | POA: Diagnosis not present

## 2015-07-24 DIAGNOSIS — D509 Iron deficiency anemia, unspecified: Secondary | ICD-10-CM | POA: Diagnosis not present

## 2015-07-26 DIAGNOSIS — N186 End stage renal disease: Secondary | ICD-10-CM | POA: Diagnosis not present

## 2015-07-26 DIAGNOSIS — N2581 Secondary hyperparathyroidism of renal origin: Secondary | ICD-10-CM | POA: Diagnosis not present

## 2015-07-26 DIAGNOSIS — D509 Iron deficiency anemia, unspecified: Secondary | ICD-10-CM | POA: Diagnosis not present

## 2015-07-26 DIAGNOSIS — D631 Anemia in chronic kidney disease: Secondary | ICD-10-CM | POA: Diagnosis not present

## 2015-07-28 DIAGNOSIS — D509 Iron deficiency anemia, unspecified: Secondary | ICD-10-CM | POA: Diagnosis not present

## 2015-07-28 DIAGNOSIS — N2581 Secondary hyperparathyroidism of renal origin: Secondary | ICD-10-CM | POA: Diagnosis not present

## 2015-07-28 DIAGNOSIS — D631 Anemia in chronic kidney disease: Secondary | ICD-10-CM | POA: Diagnosis not present

## 2015-07-28 DIAGNOSIS — N186 End stage renal disease: Secondary | ICD-10-CM | POA: Diagnosis not present

## 2015-07-31 DIAGNOSIS — N2581 Secondary hyperparathyroidism of renal origin: Secondary | ICD-10-CM | POA: Diagnosis not present

## 2015-07-31 DIAGNOSIS — D631 Anemia in chronic kidney disease: Secondary | ICD-10-CM | POA: Diagnosis not present

## 2015-07-31 DIAGNOSIS — N186 End stage renal disease: Secondary | ICD-10-CM | POA: Diagnosis not present

## 2015-07-31 DIAGNOSIS — D509 Iron deficiency anemia, unspecified: Secondary | ICD-10-CM | POA: Diagnosis not present

## 2015-08-02 DIAGNOSIS — N2581 Secondary hyperparathyroidism of renal origin: Secondary | ICD-10-CM | POA: Diagnosis not present

## 2015-08-02 DIAGNOSIS — D631 Anemia in chronic kidney disease: Secondary | ICD-10-CM | POA: Diagnosis not present

## 2015-08-02 DIAGNOSIS — D509 Iron deficiency anemia, unspecified: Secondary | ICD-10-CM | POA: Diagnosis not present

## 2015-08-02 DIAGNOSIS — N186 End stage renal disease: Secondary | ICD-10-CM | POA: Diagnosis not present

## 2015-08-04 DIAGNOSIS — N2581 Secondary hyperparathyroidism of renal origin: Secondary | ICD-10-CM | POA: Diagnosis not present

## 2015-08-04 DIAGNOSIS — N186 End stage renal disease: Secondary | ICD-10-CM | POA: Diagnosis not present

## 2015-08-04 DIAGNOSIS — D631 Anemia in chronic kidney disease: Secondary | ICD-10-CM | POA: Diagnosis not present

## 2015-08-04 DIAGNOSIS — D509 Iron deficiency anemia, unspecified: Secondary | ICD-10-CM | POA: Diagnosis not present

## 2015-08-07 DIAGNOSIS — N2581 Secondary hyperparathyroidism of renal origin: Secondary | ICD-10-CM | POA: Diagnosis not present

## 2015-08-07 DIAGNOSIS — D509 Iron deficiency anemia, unspecified: Secondary | ICD-10-CM | POA: Diagnosis not present

## 2015-08-07 DIAGNOSIS — D631 Anemia in chronic kidney disease: Secondary | ICD-10-CM | POA: Diagnosis not present

## 2015-08-07 DIAGNOSIS — N186 End stage renal disease: Secondary | ICD-10-CM | POA: Diagnosis not present

## 2015-08-09 DIAGNOSIS — D631 Anemia in chronic kidney disease: Secondary | ICD-10-CM | POA: Diagnosis not present

## 2015-08-09 DIAGNOSIS — N2581 Secondary hyperparathyroidism of renal origin: Secondary | ICD-10-CM | POA: Diagnosis not present

## 2015-08-09 DIAGNOSIS — D509 Iron deficiency anemia, unspecified: Secondary | ICD-10-CM | POA: Diagnosis not present

## 2015-08-09 DIAGNOSIS — N186 End stage renal disease: Secondary | ICD-10-CM | POA: Diagnosis not present

## 2015-08-11 DIAGNOSIS — N2581 Secondary hyperparathyroidism of renal origin: Secondary | ICD-10-CM | POA: Diagnosis not present

## 2015-08-11 DIAGNOSIS — D631 Anemia in chronic kidney disease: Secondary | ICD-10-CM | POA: Diagnosis not present

## 2015-08-11 DIAGNOSIS — N186 End stage renal disease: Secondary | ICD-10-CM | POA: Diagnosis not present

## 2015-08-11 DIAGNOSIS — D509 Iron deficiency anemia, unspecified: Secondary | ICD-10-CM | POA: Diagnosis not present

## 2015-08-13 DIAGNOSIS — M25551 Pain in right hip: Secondary | ICD-10-CM | POA: Diagnosis not present

## 2015-08-14 DIAGNOSIS — Z992 Dependence on renal dialysis: Secondary | ICD-10-CM | POA: Diagnosis not present

## 2015-08-14 DIAGNOSIS — D509 Iron deficiency anemia, unspecified: Secondary | ICD-10-CM | POA: Diagnosis not present

## 2015-08-14 DIAGNOSIS — N2581 Secondary hyperparathyroidism of renal origin: Secondary | ICD-10-CM | POA: Diagnosis not present

## 2015-08-14 DIAGNOSIS — N186 End stage renal disease: Secondary | ICD-10-CM | POA: Diagnosis not present

## 2015-08-14 DIAGNOSIS — D631 Anemia in chronic kidney disease: Secondary | ICD-10-CM | POA: Diagnosis not present

## 2015-08-14 DIAGNOSIS — T8612 Kidney transplant failure: Secondary | ICD-10-CM | POA: Diagnosis not present

## 2015-08-16 DIAGNOSIS — N186 End stage renal disease: Secondary | ICD-10-CM | POA: Diagnosis not present

## 2015-08-16 DIAGNOSIS — D509 Iron deficiency anemia, unspecified: Secondary | ICD-10-CM | POA: Diagnosis not present

## 2015-08-16 DIAGNOSIS — D631 Anemia in chronic kidney disease: Secondary | ICD-10-CM | POA: Diagnosis not present

## 2015-08-16 DIAGNOSIS — N2581 Secondary hyperparathyroidism of renal origin: Secondary | ICD-10-CM | POA: Diagnosis not present

## 2015-08-18 DIAGNOSIS — N186 End stage renal disease: Secondary | ICD-10-CM | POA: Diagnosis not present

## 2015-08-18 DIAGNOSIS — D631 Anemia in chronic kidney disease: Secondary | ICD-10-CM | POA: Diagnosis not present

## 2015-08-18 DIAGNOSIS — N2581 Secondary hyperparathyroidism of renal origin: Secondary | ICD-10-CM | POA: Diagnosis not present

## 2015-08-18 DIAGNOSIS — D509 Iron deficiency anemia, unspecified: Secondary | ICD-10-CM | POA: Diagnosis not present

## 2015-08-21 DIAGNOSIS — N2581 Secondary hyperparathyroidism of renal origin: Secondary | ICD-10-CM | POA: Diagnosis not present

## 2015-08-21 DIAGNOSIS — D631 Anemia in chronic kidney disease: Secondary | ICD-10-CM | POA: Diagnosis not present

## 2015-08-21 DIAGNOSIS — D509 Iron deficiency anemia, unspecified: Secondary | ICD-10-CM | POA: Diagnosis not present

## 2015-08-21 DIAGNOSIS — N186 End stage renal disease: Secondary | ICD-10-CM | POA: Diagnosis not present

## 2015-08-23 DIAGNOSIS — N2581 Secondary hyperparathyroidism of renal origin: Secondary | ICD-10-CM | POA: Diagnosis not present

## 2015-08-23 DIAGNOSIS — D631 Anemia in chronic kidney disease: Secondary | ICD-10-CM | POA: Diagnosis not present

## 2015-08-23 DIAGNOSIS — N186 End stage renal disease: Secondary | ICD-10-CM | POA: Diagnosis not present

## 2015-08-23 DIAGNOSIS — D509 Iron deficiency anemia, unspecified: Secondary | ICD-10-CM | POA: Diagnosis not present

## 2015-08-25 DIAGNOSIS — N2581 Secondary hyperparathyroidism of renal origin: Secondary | ICD-10-CM | POA: Diagnosis not present

## 2015-08-25 DIAGNOSIS — N186 End stage renal disease: Secondary | ICD-10-CM | POA: Diagnosis not present

## 2015-08-25 DIAGNOSIS — D631 Anemia in chronic kidney disease: Secondary | ICD-10-CM | POA: Diagnosis not present

## 2015-08-25 DIAGNOSIS — D509 Iron deficiency anemia, unspecified: Secondary | ICD-10-CM | POA: Diagnosis not present

## 2015-08-28 DIAGNOSIS — N186 End stage renal disease: Secondary | ICD-10-CM | POA: Diagnosis not present

## 2015-08-28 DIAGNOSIS — N2581 Secondary hyperparathyroidism of renal origin: Secondary | ICD-10-CM | POA: Diagnosis not present

## 2015-08-28 DIAGNOSIS — D509 Iron deficiency anemia, unspecified: Secondary | ICD-10-CM | POA: Diagnosis not present

## 2015-08-28 DIAGNOSIS — D631 Anemia in chronic kidney disease: Secondary | ICD-10-CM | POA: Diagnosis not present

## 2015-08-30 DIAGNOSIS — N186 End stage renal disease: Secondary | ICD-10-CM | POA: Diagnosis not present

## 2015-08-30 DIAGNOSIS — D509 Iron deficiency anemia, unspecified: Secondary | ICD-10-CM | POA: Diagnosis not present

## 2015-08-30 DIAGNOSIS — N2581 Secondary hyperparathyroidism of renal origin: Secondary | ICD-10-CM | POA: Diagnosis not present

## 2015-08-30 DIAGNOSIS — D631 Anemia in chronic kidney disease: Secondary | ICD-10-CM | POA: Diagnosis not present

## 2015-09-01 DIAGNOSIS — N2581 Secondary hyperparathyroidism of renal origin: Secondary | ICD-10-CM | POA: Diagnosis not present

## 2015-09-01 DIAGNOSIS — N186 End stage renal disease: Secondary | ICD-10-CM | POA: Diagnosis not present

## 2015-09-01 DIAGNOSIS — D509 Iron deficiency anemia, unspecified: Secondary | ICD-10-CM | POA: Diagnosis not present

## 2015-09-01 DIAGNOSIS — D631 Anemia in chronic kidney disease: Secondary | ICD-10-CM | POA: Diagnosis not present

## 2015-09-04 DIAGNOSIS — D509 Iron deficiency anemia, unspecified: Secondary | ICD-10-CM | POA: Diagnosis not present

## 2015-09-04 DIAGNOSIS — D631 Anemia in chronic kidney disease: Secondary | ICD-10-CM | POA: Diagnosis not present

## 2015-09-04 DIAGNOSIS — N186 End stage renal disease: Secondary | ICD-10-CM | POA: Diagnosis not present

## 2015-09-04 DIAGNOSIS — N2581 Secondary hyperparathyroidism of renal origin: Secondary | ICD-10-CM | POA: Diagnosis not present

## 2015-09-06 DIAGNOSIS — D631 Anemia in chronic kidney disease: Secondary | ICD-10-CM | POA: Diagnosis not present

## 2015-09-06 DIAGNOSIS — D509 Iron deficiency anemia, unspecified: Secondary | ICD-10-CM | POA: Diagnosis not present

## 2015-09-06 DIAGNOSIS — N2581 Secondary hyperparathyroidism of renal origin: Secondary | ICD-10-CM | POA: Diagnosis not present

## 2015-09-06 DIAGNOSIS — N186 End stage renal disease: Secondary | ICD-10-CM | POA: Diagnosis not present

## 2015-09-08 DIAGNOSIS — N186 End stage renal disease: Secondary | ICD-10-CM | POA: Diagnosis not present

## 2015-09-08 DIAGNOSIS — D509 Iron deficiency anemia, unspecified: Secondary | ICD-10-CM | POA: Diagnosis not present

## 2015-09-08 DIAGNOSIS — D631 Anemia in chronic kidney disease: Secondary | ICD-10-CM | POA: Diagnosis not present

## 2015-09-08 DIAGNOSIS — N2581 Secondary hyperparathyroidism of renal origin: Secondary | ICD-10-CM | POA: Diagnosis not present

## 2015-09-11 DIAGNOSIS — D631 Anemia in chronic kidney disease: Secondary | ICD-10-CM | POA: Diagnosis not present

## 2015-09-11 DIAGNOSIS — N186 End stage renal disease: Secondary | ICD-10-CM | POA: Diagnosis not present

## 2015-09-11 DIAGNOSIS — N2581 Secondary hyperparathyroidism of renal origin: Secondary | ICD-10-CM | POA: Diagnosis not present

## 2015-09-11 DIAGNOSIS — D509 Iron deficiency anemia, unspecified: Secondary | ICD-10-CM | POA: Diagnosis not present

## 2015-09-13 DIAGNOSIS — D509 Iron deficiency anemia, unspecified: Secondary | ICD-10-CM | POA: Diagnosis not present

## 2015-09-13 DIAGNOSIS — N2581 Secondary hyperparathyroidism of renal origin: Secondary | ICD-10-CM | POA: Diagnosis not present

## 2015-09-13 DIAGNOSIS — N186 End stage renal disease: Secondary | ICD-10-CM | POA: Diagnosis not present

## 2015-09-13 DIAGNOSIS — D631 Anemia in chronic kidney disease: Secondary | ICD-10-CM | POA: Diagnosis not present

## 2015-09-14 DIAGNOSIS — N186 End stage renal disease: Secondary | ICD-10-CM | POA: Diagnosis not present

## 2015-09-14 DIAGNOSIS — T8612 Kidney transplant failure: Secondary | ICD-10-CM | POA: Diagnosis not present

## 2015-09-14 DIAGNOSIS — Z992 Dependence on renal dialysis: Secondary | ICD-10-CM | POA: Diagnosis not present

## 2015-09-15 DIAGNOSIS — D631 Anemia in chronic kidney disease: Secondary | ICD-10-CM | POA: Diagnosis not present

## 2015-09-15 DIAGNOSIS — N186 End stage renal disease: Secondary | ICD-10-CM | POA: Diagnosis not present

## 2015-09-15 DIAGNOSIS — N2581 Secondary hyperparathyroidism of renal origin: Secondary | ICD-10-CM | POA: Diagnosis not present

## 2015-09-15 DIAGNOSIS — D509 Iron deficiency anemia, unspecified: Secondary | ICD-10-CM | POA: Diagnosis not present

## 2015-09-18 DIAGNOSIS — N186 End stage renal disease: Secondary | ICD-10-CM | POA: Diagnosis not present

## 2015-09-18 DIAGNOSIS — D509 Iron deficiency anemia, unspecified: Secondary | ICD-10-CM | POA: Diagnosis not present

## 2015-09-18 DIAGNOSIS — D631 Anemia in chronic kidney disease: Secondary | ICD-10-CM | POA: Diagnosis not present

## 2015-09-18 DIAGNOSIS — N2581 Secondary hyperparathyroidism of renal origin: Secondary | ICD-10-CM | POA: Diagnosis not present

## 2015-09-20 DIAGNOSIS — D509 Iron deficiency anemia, unspecified: Secondary | ICD-10-CM | POA: Diagnosis not present

## 2015-09-20 DIAGNOSIS — D631 Anemia in chronic kidney disease: Secondary | ICD-10-CM | POA: Diagnosis not present

## 2015-09-20 DIAGNOSIS — N2581 Secondary hyperparathyroidism of renal origin: Secondary | ICD-10-CM | POA: Diagnosis not present

## 2015-09-20 DIAGNOSIS — N186 End stage renal disease: Secondary | ICD-10-CM | POA: Diagnosis not present

## 2015-09-22 DIAGNOSIS — N186 End stage renal disease: Secondary | ICD-10-CM | POA: Diagnosis not present

## 2015-09-22 DIAGNOSIS — N2581 Secondary hyperparathyroidism of renal origin: Secondary | ICD-10-CM | POA: Diagnosis not present

## 2015-09-22 DIAGNOSIS — D509 Iron deficiency anemia, unspecified: Secondary | ICD-10-CM | POA: Diagnosis not present

## 2015-09-22 DIAGNOSIS — D631 Anemia in chronic kidney disease: Secondary | ICD-10-CM | POA: Diagnosis not present

## 2015-09-25 DIAGNOSIS — N186 End stage renal disease: Secondary | ICD-10-CM | POA: Diagnosis not present

## 2015-09-25 DIAGNOSIS — D631 Anemia in chronic kidney disease: Secondary | ICD-10-CM | POA: Diagnosis not present

## 2015-09-25 DIAGNOSIS — D509 Iron deficiency anemia, unspecified: Secondary | ICD-10-CM | POA: Diagnosis not present

## 2015-09-25 DIAGNOSIS — N2581 Secondary hyperparathyroidism of renal origin: Secondary | ICD-10-CM | POA: Diagnosis not present

## 2015-09-27 DIAGNOSIS — N186 End stage renal disease: Secondary | ICD-10-CM | POA: Diagnosis not present

## 2015-09-27 DIAGNOSIS — N2581 Secondary hyperparathyroidism of renal origin: Secondary | ICD-10-CM | POA: Diagnosis not present

## 2015-09-27 DIAGNOSIS — D631 Anemia in chronic kidney disease: Secondary | ICD-10-CM | POA: Diagnosis not present

## 2015-09-27 DIAGNOSIS — D509 Iron deficiency anemia, unspecified: Secondary | ICD-10-CM | POA: Diagnosis not present

## 2015-09-29 DIAGNOSIS — N186 End stage renal disease: Secondary | ICD-10-CM | POA: Diagnosis not present

## 2015-09-29 DIAGNOSIS — D631 Anemia in chronic kidney disease: Secondary | ICD-10-CM | POA: Diagnosis not present

## 2015-09-29 DIAGNOSIS — D509 Iron deficiency anemia, unspecified: Secondary | ICD-10-CM | POA: Diagnosis not present

## 2015-09-29 DIAGNOSIS — N2581 Secondary hyperparathyroidism of renal origin: Secondary | ICD-10-CM | POA: Diagnosis not present

## 2015-10-02 DIAGNOSIS — N2581 Secondary hyperparathyroidism of renal origin: Secondary | ICD-10-CM | POA: Diagnosis not present

## 2015-10-02 DIAGNOSIS — D509 Iron deficiency anemia, unspecified: Secondary | ICD-10-CM | POA: Diagnosis not present

## 2015-10-02 DIAGNOSIS — D631 Anemia in chronic kidney disease: Secondary | ICD-10-CM | POA: Diagnosis not present

## 2015-10-02 DIAGNOSIS — N186 End stage renal disease: Secondary | ICD-10-CM | POA: Diagnosis not present

## 2015-10-04 DIAGNOSIS — D509 Iron deficiency anemia, unspecified: Secondary | ICD-10-CM | POA: Diagnosis not present

## 2015-10-04 DIAGNOSIS — N186 End stage renal disease: Secondary | ICD-10-CM | POA: Diagnosis not present

## 2015-10-04 DIAGNOSIS — D631 Anemia in chronic kidney disease: Secondary | ICD-10-CM | POA: Diagnosis not present

## 2015-10-04 DIAGNOSIS — N2581 Secondary hyperparathyroidism of renal origin: Secondary | ICD-10-CM | POA: Diagnosis not present

## 2015-10-05 DIAGNOSIS — M25561 Pain in right knee: Secondary | ICD-10-CM | POA: Diagnosis not present

## 2015-10-05 DIAGNOSIS — R262 Difficulty in walking, not elsewhere classified: Secondary | ICD-10-CM | POA: Diagnosis not present

## 2015-10-06 DIAGNOSIS — D509 Iron deficiency anemia, unspecified: Secondary | ICD-10-CM | POA: Diagnosis not present

## 2015-10-06 DIAGNOSIS — N2581 Secondary hyperparathyroidism of renal origin: Secondary | ICD-10-CM | POA: Diagnosis not present

## 2015-10-06 DIAGNOSIS — N186 End stage renal disease: Secondary | ICD-10-CM | POA: Diagnosis not present

## 2015-10-06 DIAGNOSIS — D631 Anemia in chronic kidney disease: Secondary | ICD-10-CM | POA: Diagnosis not present

## 2015-10-09 DIAGNOSIS — N186 End stage renal disease: Secondary | ICD-10-CM | POA: Diagnosis not present

## 2015-10-09 DIAGNOSIS — D509 Iron deficiency anemia, unspecified: Secondary | ICD-10-CM | POA: Diagnosis not present

## 2015-10-09 DIAGNOSIS — N2581 Secondary hyperparathyroidism of renal origin: Secondary | ICD-10-CM | POA: Diagnosis not present

## 2015-10-09 DIAGNOSIS — D631 Anemia in chronic kidney disease: Secondary | ICD-10-CM | POA: Diagnosis not present

## 2015-10-11 DIAGNOSIS — D509 Iron deficiency anemia, unspecified: Secondary | ICD-10-CM | POA: Diagnosis not present

## 2015-10-11 DIAGNOSIS — D631 Anemia in chronic kidney disease: Secondary | ICD-10-CM | POA: Diagnosis not present

## 2015-10-11 DIAGNOSIS — N2581 Secondary hyperparathyroidism of renal origin: Secondary | ICD-10-CM | POA: Diagnosis not present

## 2015-10-11 DIAGNOSIS — N186 End stage renal disease: Secondary | ICD-10-CM | POA: Diagnosis not present

## 2015-10-13 DIAGNOSIS — D631 Anemia in chronic kidney disease: Secondary | ICD-10-CM | POA: Diagnosis not present

## 2015-10-13 DIAGNOSIS — D509 Iron deficiency anemia, unspecified: Secondary | ICD-10-CM | POA: Diagnosis not present

## 2015-10-13 DIAGNOSIS — N186 End stage renal disease: Secondary | ICD-10-CM | POA: Diagnosis not present

## 2015-10-13 DIAGNOSIS — N2581 Secondary hyperparathyroidism of renal origin: Secondary | ICD-10-CM | POA: Diagnosis not present

## 2015-10-14 DIAGNOSIS — T8612 Kidney transplant failure: Secondary | ICD-10-CM | POA: Diagnosis not present

## 2015-10-14 DIAGNOSIS — N186 End stage renal disease: Secondary | ICD-10-CM | POA: Diagnosis not present

## 2015-10-14 DIAGNOSIS — Z992 Dependence on renal dialysis: Secondary | ICD-10-CM | POA: Diagnosis not present

## 2015-10-16 DIAGNOSIS — D631 Anemia in chronic kidney disease: Secondary | ICD-10-CM | POA: Diagnosis not present

## 2015-10-16 DIAGNOSIS — N2581 Secondary hyperparathyroidism of renal origin: Secondary | ICD-10-CM | POA: Diagnosis not present

## 2015-10-16 DIAGNOSIS — N186 End stage renal disease: Secondary | ICD-10-CM | POA: Diagnosis not present

## 2015-10-16 DIAGNOSIS — D509 Iron deficiency anemia, unspecified: Secondary | ICD-10-CM | POA: Diagnosis not present

## 2015-10-17 ENCOUNTER — Ambulatory Visit: Payer: Self-pay | Admitting: Surgery

## 2015-10-17 DIAGNOSIS — N2581 Secondary hyperparathyroidism of renal origin: Secondary | ICD-10-CM | POA: Diagnosis not present

## 2015-10-18 DIAGNOSIS — N186 End stage renal disease: Secondary | ICD-10-CM | POA: Diagnosis not present

## 2015-10-22 DIAGNOSIS — N186 End stage renal disease: Secondary | ICD-10-CM | POA: Diagnosis not present

## 2015-10-25 DIAGNOSIS — N186 End stage renal disease: Secondary | ICD-10-CM | POA: Diagnosis not present

## 2015-10-25 DIAGNOSIS — D509 Iron deficiency anemia, unspecified: Secondary | ICD-10-CM | POA: Diagnosis not present

## 2015-10-25 DIAGNOSIS — D631 Anemia in chronic kidney disease: Secondary | ICD-10-CM | POA: Diagnosis not present

## 2015-10-25 DIAGNOSIS — N2581 Secondary hyperparathyroidism of renal origin: Secondary | ICD-10-CM | POA: Diagnosis not present

## 2015-10-27 DIAGNOSIS — D631 Anemia in chronic kidney disease: Secondary | ICD-10-CM | POA: Diagnosis not present

## 2015-10-27 DIAGNOSIS — D509 Iron deficiency anemia, unspecified: Secondary | ICD-10-CM | POA: Diagnosis not present

## 2015-10-27 DIAGNOSIS — N2581 Secondary hyperparathyroidism of renal origin: Secondary | ICD-10-CM | POA: Diagnosis not present

## 2015-10-27 DIAGNOSIS — N186 End stage renal disease: Secondary | ICD-10-CM | POA: Diagnosis not present

## 2015-10-30 DIAGNOSIS — N186 End stage renal disease: Secondary | ICD-10-CM | POA: Diagnosis not present

## 2015-10-30 DIAGNOSIS — D509 Iron deficiency anemia, unspecified: Secondary | ICD-10-CM | POA: Diagnosis not present

## 2015-10-30 DIAGNOSIS — N2581 Secondary hyperparathyroidism of renal origin: Secondary | ICD-10-CM | POA: Diagnosis not present

## 2015-10-30 DIAGNOSIS — D631 Anemia in chronic kidney disease: Secondary | ICD-10-CM | POA: Diagnosis not present

## 2015-11-01 DIAGNOSIS — N186 End stage renal disease: Secondary | ICD-10-CM | POA: Diagnosis not present

## 2015-11-01 DIAGNOSIS — N2581 Secondary hyperparathyroidism of renal origin: Secondary | ICD-10-CM | POA: Diagnosis not present

## 2015-11-01 DIAGNOSIS — D631 Anemia in chronic kidney disease: Secondary | ICD-10-CM | POA: Diagnosis not present

## 2015-11-01 DIAGNOSIS — D509 Iron deficiency anemia, unspecified: Secondary | ICD-10-CM | POA: Diagnosis not present

## 2015-11-03 DIAGNOSIS — D509 Iron deficiency anemia, unspecified: Secondary | ICD-10-CM | POA: Diagnosis not present

## 2015-11-03 DIAGNOSIS — D631 Anemia in chronic kidney disease: Secondary | ICD-10-CM | POA: Diagnosis not present

## 2015-11-03 DIAGNOSIS — N2581 Secondary hyperparathyroidism of renal origin: Secondary | ICD-10-CM | POA: Diagnosis not present

## 2015-11-03 DIAGNOSIS — N186 End stage renal disease: Secondary | ICD-10-CM | POA: Diagnosis not present

## 2015-11-06 DIAGNOSIS — N2581 Secondary hyperparathyroidism of renal origin: Secondary | ICD-10-CM | POA: Diagnosis not present

## 2015-11-06 DIAGNOSIS — N186 End stage renal disease: Secondary | ICD-10-CM | POA: Diagnosis not present

## 2015-11-06 DIAGNOSIS — D631 Anemia in chronic kidney disease: Secondary | ICD-10-CM | POA: Diagnosis not present

## 2015-11-06 DIAGNOSIS — D509 Iron deficiency anemia, unspecified: Secondary | ICD-10-CM | POA: Diagnosis not present

## 2015-11-08 DIAGNOSIS — N186 End stage renal disease: Secondary | ICD-10-CM | POA: Diagnosis not present

## 2015-11-08 DIAGNOSIS — N2581 Secondary hyperparathyroidism of renal origin: Secondary | ICD-10-CM | POA: Diagnosis not present

## 2015-11-08 DIAGNOSIS — D509 Iron deficiency anemia, unspecified: Secondary | ICD-10-CM | POA: Diagnosis not present

## 2015-11-08 DIAGNOSIS — D631 Anemia in chronic kidney disease: Secondary | ICD-10-CM | POA: Diagnosis not present

## 2015-11-10 DIAGNOSIS — N2581 Secondary hyperparathyroidism of renal origin: Secondary | ICD-10-CM | POA: Diagnosis not present

## 2015-11-10 DIAGNOSIS — D509 Iron deficiency anemia, unspecified: Secondary | ICD-10-CM | POA: Diagnosis not present

## 2015-11-10 DIAGNOSIS — N186 End stage renal disease: Secondary | ICD-10-CM | POA: Diagnosis not present

## 2015-11-10 DIAGNOSIS — D631 Anemia in chronic kidney disease: Secondary | ICD-10-CM | POA: Diagnosis not present

## 2015-11-13 DIAGNOSIS — N186 End stage renal disease: Secondary | ICD-10-CM | POA: Diagnosis not present

## 2015-11-13 DIAGNOSIS — N2581 Secondary hyperparathyroidism of renal origin: Secondary | ICD-10-CM | POA: Diagnosis not present

## 2015-11-13 DIAGNOSIS — D509 Iron deficiency anemia, unspecified: Secondary | ICD-10-CM | POA: Diagnosis not present

## 2015-11-13 DIAGNOSIS — D631 Anemia in chronic kidney disease: Secondary | ICD-10-CM | POA: Diagnosis not present

## 2015-11-14 DIAGNOSIS — T8612 Kidney transplant failure: Secondary | ICD-10-CM | POA: Diagnosis not present

## 2015-11-14 DIAGNOSIS — Z992 Dependence on renal dialysis: Secondary | ICD-10-CM | POA: Diagnosis not present

## 2015-11-14 DIAGNOSIS — N186 End stage renal disease: Secondary | ICD-10-CM | POA: Diagnosis not present

## 2015-11-15 DIAGNOSIS — D509 Iron deficiency anemia, unspecified: Secondary | ICD-10-CM | POA: Diagnosis not present

## 2015-11-15 DIAGNOSIS — N186 End stage renal disease: Secondary | ICD-10-CM | POA: Diagnosis not present

## 2015-11-15 DIAGNOSIS — N2581 Secondary hyperparathyroidism of renal origin: Secondary | ICD-10-CM | POA: Diagnosis not present

## 2015-11-15 DIAGNOSIS — D631 Anemia in chronic kidney disease: Secondary | ICD-10-CM | POA: Diagnosis not present

## 2015-11-17 DIAGNOSIS — N2581 Secondary hyperparathyroidism of renal origin: Secondary | ICD-10-CM | POA: Diagnosis not present

## 2015-11-17 DIAGNOSIS — N186 End stage renal disease: Secondary | ICD-10-CM | POA: Diagnosis not present

## 2015-11-17 DIAGNOSIS — D509 Iron deficiency anemia, unspecified: Secondary | ICD-10-CM | POA: Diagnosis not present

## 2015-11-17 DIAGNOSIS — D631 Anemia in chronic kidney disease: Secondary | ICD-10-CM | POA: Diagnosis not present

## 2015-11-20 DIAGNOSIS — D509 Iron deficiency anemia, unspecified: Secondary | ICD-10-CM | POA: Diagnosis not present

## 2015-11-20 DIAGNOSIS — N2581 Secondary hyperparathyroidism of renal origin: Secondary | ICD-10-CM | POA: Diagnosis not present

## 2015-11-20 DIAGNOSIS — N186 End stage renal disease: Secondary | ICD-10-CM | POA: Diagnosis not present

## 2015-11-20 DIAGNOSIS — D631 Anemia in chronic kidney disease: Secondary | ICD-10-CM | POA: Diagnosis not present

## 2015-11-21 ENCOUNTER — Encounter (HOSPITAL_COMMUNITY): Payer: Self-pay

## 2015-11-21 ENCOUNTER — Encounter (HOSPITAL_COMMUNITY)
Admission: RE | Admit: 2015-11-21 | Discharge: 2015-11-21 | Disposition: A | Discharge status: Home / Self Care | Payer: Medicare Other | Source: Ambulatory Visit | Attending: Surgery | Admitting: Surgery

## 2015-11-21 DIAGNOSIS — N2581 Secondary hyperparathyroidism of renal origin: Secondary | ICD-10-CM | POA: Diagnosis not present

## 2015-11-21 DIAGNOSIS — Z01818 Encounter for other preprocedural examination: Secondary | ICD-10-CM | POA: Insufficient documentation

## 2015-11-21 DIAGNOSIS — Z01812 Encounter for preprocedural laboratory examination: Secondary | ICD-10-CM | POA: Diagnosis not present

## 2015-11-21 DIAGNOSIS — I10 Essential (primary) hypertension: Secondary | ICD-10-CM | POA: Insufficient documentation

## 2015-11-21 HISTORY — DX: Alport syndrome: Q87.81

## 2015-11-21 HISTORY — DX: Cardiac murmur, unspecified: R01.1

## 2015-11-21 HISTORY — DX: Disease of blood and blood-forming organs, unspecified: D75.9

## 2015-11-21 HISTORY — DX: Sickle-cell trait: D57.3

## 2015-11-21 HISTORY — DX: Anemia, unspecified: D64.9

## 2015-11-21 HISTORY — DX: Headache, unspecified: R51.9

## 2015-11-21 HISTORY — DX: Headache: R51

## 2015-11-21 LAB — BASIC METABOLIC PANEL
Anion gap: 14 (ref 5–15)
BUN: 32 mg/dL — AB (ref 6–20)
CALCIUM: 10.6 mg/dL — AB (ref 8.9–10.3)
CO2: 29 mmol/L (ref 22–32)
CREATININE: 11.2 mg/dL — AB (ref 0.61–1.24)
Chloride: 94 mmol/L — ABNORMAL LOW (ref 101–111)
GFR calc non Af Amer: 5 mL/min — ABNORMAL LOW (ref >60)
GFR, EST AFRICAN AMERICAN: 6 mL/min — AB (ref >60)
Glucose, Bld: 78 mg/dL (ref 65–99)
Potassium: 4.7 mmol/L (ref 3.5–5.1)
SODIUM: 137 mmol/L (ref 135–145)

## 2015-11-21 LAB — CBC
HEMATOCRIT: 36.9 % — AB (ref 39.0–52.0)
Hemoglobin: 11.9 g/dL — ABNORMAL LOW (ref 13.0–17.0)
MCH: 26 pg (ref 26.0–34.0)
MCHC: 32.2 g/dL (ref 30.0–36.0)
MCV: 80.6 fL (ref 78.0–100.0)
Platelets: 182 10*3/uL (ref 150–400)
RBC: 4.58 MIL/uL (ref 4.22–5.81)
RDW: 17.9 % — AB (ref 11.5–15.5)
WBC: 5.6 10*3/uL (ref 4.0–10.5)

## 2015-11-22 DIAGNOSIS — D631 Anemia in chronic kidney disease: Secondary | ICD-10-CM | POA: Diagnosis not present

## 2015-11-22 DIAGNOSIS — N2581 Secondary hyperparathyroidism of renal origin: Secondary | ICD-10-CM | POA: Diagnosis not present

## 2015-11-22 DIAGNOSIS — D509 Iron deficiency anemia, unspecified: Secondary | ICD-10-CM | POA: Diagnosis not present

## 2015-11-22 DIAGNOSIS — N186 End stage renal disease: Secondary | ICD-10-CM | POA: Diagnosis not present

## 2015-11-24 DIAGNOSIS — N186 End stage renal disease: Secondary | ICD-10-CM | POA: Diagnosis not present

## 2015-11-24 DIAGNOSIS — D509 Iron deficiency anemia, unspecified: Secondary | ICD-10-CM | POA: Diagnosis not present

## 2015-11-24 DIAGNOSIS — N2581 Secondary hyperparathyroidism of renal origin: Secondary | ICD-10-CM | POA: Diagnosis not present

## 2015-11-24 DIAGNOSIS — D631 Anemia in chronic kidney disease: Secondary | ICD-10-CM | POA: Diagnosis not present

## 2015-11-27 DIAGNOSIS — N2581 Secondary hyperparathyroidism of renal origin: Secondary | ICD-10-CM | POA: Diagnosis not present

## 2015-11-27 DIAGNOSIS — D631 Anemia in chronic kidney disease: Secondary | ICD-10-CM | POA: Diagnosis not present

## 2015-11-27 DIAGNOSIS — D509 Iron deficiency anemia, unspecified: Secondary | ICD-10-CM | POA: Diagnosis not present

## 2015-11-27 DIAGNOSIS — N186 End stage renal disease: Secondary | ICD-10-CM | POA: Diagnosis not present

## 2015-11-29 DIAGNOSIS — N186 End stage renal disease: Secondary | ICD-10-CM | POA: Diagnosis not present

## 2015-11-29 DIAGNOSIS — N2581 Secondary hyperparathyroidism of renal origin: Secondary | ICD-10-CM | POA: Diagnosis not present

## 2015-11-29 DIAGNOSIS — D509 Iron deficiency anemia, unspecified: Secondary | ICD-10-CM | POA: Diagnosis not present

## 2015-11-29 DIAGNOSIS — D631 Anemia in chronic kidney disease: Secondary | ICD-10-CM | POA: Diagnosis not present

## 2015-12-01 DIAGNOSIS — N186 End stage renal disease: Secondary | ICD-10-CM | POA: Diagnosis not present

## 2015-12-01 DIAGNOSIS — D631 Anemia in chronic kidney disease: Secondary | ICD-10-CM | POA: Diagnosis not present

## 2015-12-01 DIAGNOSIS — D509 Iron deficiency anemia, unspecified: Secondary | ICD-10-CM | POA: Diagnosis not present

## 2015-12-01 DIAGNOSIS — N2581 Secondary hyperparathyroidism of renal origin: Secondary | ICD-10-CM | POA: Diagnosis not present

## 2015-12-02 ENCOUNTER — Encounter (HOSPITAL_COMMUNITY): Payer: Self-pay | Admitting: Surgery

## 2015-12-02 DIAGNOSIS — N186 End stage renal disease: Secondary | ICD-10-CM

## 2015-12-02 DIAGNOSIS — Z992 Dependence on renal dialysis: Secondary | ICD-10-CM

## 2015-12-02 DIAGNOSIS — N2581 Secondary hyperparathyroidism of renal origin: Secondary | ICD-10-CM | POA: Diagnosis present

## 2015-12-02 NOTE — H&P (Signed)
General Surgery [facility]  Heinz Knuckles DOB: Dec 31, 1985 Married / Language: English / Race: Black or African American Male  History of Present Illness   The patient is a 30 year old male who presents with secondary hyperparathyroidism.  Patient is referred by Dr. Jeneen Rinks Deterding from nephrology for surgical management of secondary hyperparathyroidism. Patient has end-stage renal disease. He has Alport syndrome. He was on hemodialysis for 4-5 years before undergoing renal transplantation at [facility] in 2009. Graft failed 2 years ago and he has been on hemodialysis since that time. He has an access in the left forearm. He dialyzes on Tuesdays Thursdays and Saturdays at the [employer]. Patient has developed secondary hyperparathyroidism with markedly elevated intact PTH levels. Laboratory studies show a PTH of 2737 in January 2017. Phosphorus is elevated at 8.1 and calcium times phosphorous product was elevated at 78. Alkaline phosphatase is elevated at 211. Patient has had no prior surgery on the neck. He now presents for consideration for total parathyroidectomy with autotransplantation. He is accompanied today by his wife. Patient works as a Animal nutritionist at [employer]. Patient has been noncompliant with treatment with Sensipar due to gastrointestinal side effects.  Other Problems Chronic Renal Failure Syndrome Gastroesophageal Reflux Disease  Past Surgical History Dialysis Shunt / Fistula Oral Surgery  Diagnostic Studies History Colonoscopy never  Allergies No Known Drug Allergies05/08/2015  Medication History Renvela ($RemoveBeforeDE'800MG'ioCplQaEBvtVpQf$  Tablet, Oral) Active. Sensipar ($RemoveBeforeDEI'90MG'hjAlpPBnjmyFeFJR$  Tablet, Oral) Active. Medications Reconciled  Social History Caffeine use Carbonated beverages, Coffee, Tea. No alcohol use No drug use Tobacco use Former smoker.  Family History Alcohol Abuse Father. Arthritis Mother. Bleeding disorder Mother. Breast  Cancer Mother. Colon Cancer Family Members In General. Diabetes Mellitus Family Members In General. Hypertension Family Members In General, Mother. Kidney Disease Family Members In General.  Review of Systems HEENT Present- Hearing Loss, Seasonal Allergies and Wears glasses/contact lenses. Not Present- Earache, Hoarseness, Nose Bleed, Oral Ulcers, Ringing in the Ears, Sinus Pain, Sore Throat, Visual Disturbances and Yellow Eyes. Respiratory Not Present- Bloody sputum, Chronic Cough, Difficulty Breathing, Snoring and Wheezing. Breast Not Present- Breast Mass, Breast Pain, Nipple Discharge and Skin Changes. Cardiovascular Not Present- Chest Pain, Difficulty Breathing Lying Down, Leg Cramps, Palpitations, Rapid Heart Rate, Shortness of Breath and Swelling of Extremities. Gastrointestinal Not Present- Abdominal Pain, Bloating, Bloody Stool, Change in Bowel Habits, Chronic diarrhea, Constipation, Difficulty Swallowing, Excessive gas, Gets full quickly at meals, Hemorrhoids, Indigestion, Nausea, Rectal Pain and Vomiting. Male Genitourinary Not Present- Blood in Urine, Change in Urinary Stream, Frequency, Impotence, Nocturia, Painful Urination, Urgency and Urine Leakage. Musculoskeletal Present- Joint Pain, Joint Stiffness and Swelling of Extremities. Not Present- Back Pain, Muscle Pain and Muscle Weakness. Neurological Not Present- Decreased Memory, Fainting, Headaches, Numbness, Seizures, Tingling, Tremor, Trouble walking and Weakness. Psychiatric Not Present- Anxiety, Bipolar, Change in Sleep Pattern, Depression, Fearful and Frequent crying. Hematology Not Present- Easy Bruising, Excessive bleeding, Gland problems, HIV and Persistent Infections.  Vitals Weight: 142.2 lb Height: 69in Body Surface Area: 1.79 m Body Mass Index: 21 kg/m  Temp.: 98.3F(Oral)  Pulse: 68 (Regular)  BP: 138/82 (Sitting, Left Arm, Standard)  Physical Exam   General - appears comfortable, no distress;  not diaphorectic  HEENT - normocephalic; sclerae clear, gaze conjugate; mucous membranes moist, dentition good; voice normal  Neck - symmetric on extension; no palpable anterior or posterior cervical adenopathy; no palpable masses in the thyroid bed  Chest - clear bilaterally without rhonchi, rales, or wheeze  Cor - regular rhythm with  normal rate; no significant murmur  Ext - non-tender without significant edema or lymphedema; AV fistula left forearm with palpable thrill  Neuro - grossly intact; no tremor   Assessment & Plan  SECONDARY HYPERPARATHYROIDISM, 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 Patient has been noncompliant with Sensipar due to gastrointestinal side effects.  Patient will require total parathyroidectomy with autotransplantation to the right forearm. We have discussed alternative procedures. We discussed the risk and benefits of surgery including the potential for recurrent laryngeal nerve injury and the potential for ectopic or extranumery parathyroid glands. We discussed the hospital stay to be anticipated. We discussed his postoperative recovery and return to work. We discussed the possibility of recurrent disease. He and his wife understand and wish to proceed with surgery in the near future.  The risks and benefits of the procedure have been discussed at length with the patient. The patient understands the proposed procedure, potential alternative treatments, and the course of recovery to be expected. All of the patient's questions have been answered at this time. The patient wishes to proceed with surgery.  Earnstine Regal, MD, [facility] Office: [phone]

## 2015-12-03 ENCOUNTER — Inpatient Hospital Stay (HOSPITAL_COMMUNITY): Payer: Medicare Other | Admitting: Certified Registered"

## 2015-12-03 ENCOUNTER — Encounter (HOSPITAL_COMMUNITY)
Admission: RE | Disposition: A | Discharge status: Home / Self Care | Payer: Self-pay | Source: Ambulatory Visit | Attending: Surgery

## 2015-12-03 ENCOUNTER — Inpatient Hospital Stay (HOSPITAL_COMMUNITY): Payer: Medicare Other

## 2015-12-03 ENCOUNTER — Inpatient Hospital Stay (HOSPITAL_COMMUNITY)
Admission: RE | Admit: 2015-12-03 | Discharge: 2015-12-05 | DRG: 625 | Disposition: A | Discharge status: Home / Self Care | Payer: Medicare Other | Source: Ambulatory Visit | Attending: Surgery | Admitting: Surgery

## 2015-12-03 ENCOUNTER — Other Ambulatory Visit: Payer: Self-pay

## 2015-12-03 ENCOUNTER — Encounter (HOSPITAL_COMMUNITY): Payer: Self-pay | Admitting: *Deleted

## 2015-12-03 DIAGNOSIS — Z87891 Personal history of nicotine dependence: Secondary | ICD-10-CM

## 2015-12-03 DIAGNOSIS — N186 End stage renal disease: Secondary | ICD-10-CM

## 2015-12-03 DIAGNOSIS — I120 Hypertensive chronic kidney disease with stage 5 chronic kidney disease or end stage renal disease: Secondary | ICD-10-CM | POA: Diagnosis present

## 2015-12-03 DIAGNOSIS — Z992 Dependence on renal dialysis: Secondary | ICD-10-CM

## 2015-12-03 DIAGNOSIS — N2581 Secondary hyperparathyroidism of renal origin: Secondary | ICD-10-CM | POA: Diagnosis present

## 2015-12-03 DIAGNOSIS — Z9119 Patient's noncompliance with other medical treatment and regimen: Secondary | ICD-10-CM | POA: Diagnosis not present

## 2015-12-03 DIAGNOSIS — T8612 Kidney transplant failure: Secondary | ICD-10-CM | POA: Diagnosis present

## 2015-12-03 DIAGNOSIS — D573 Sickle-cell trait: Secondary | ICD-10-CM | POA: Diagnosis present

## 2015-12-03 DIAGNOSIS — E210 Primary hyperparathyroidism: Secondary | ICD-10-CM | POA: Diagnosis not present

## 2015-12-03 DIAGNOSIS — Q8781 Alport syndrome: Secondary | ICD-10-CM | POA: Diagnosis not present

## 2015-12-03 DIAGNOSIS — R071 Chest pain on breathing: Secondary | ICD-10-CM

## 2015-12-03 DIAGNOSIS — E211 Secondary hyperparathyroidism, not elsewhere classified: Principal | ICD-10-CM | POA: Diagnosis present

## 2015-12-03 DIAGNOSIS — K219 Gastro-esophageal reflux disease without esophagitis: Secondary | ICD-10-CM | POA: Diagnosis present

## 2015-12-03 DIAGNOSIS — R079 Chest pain, unspecified: Secondary | ICD-10-CM | POA: Diagnosis not present

## 2015-12-03 DIAGNOSIS — Z79899 Other long term (current) drug therapy: Secondary | ICD-10-CM | POA: Diagnosis not present

## 2015-12-03 DIAGNOSIS — D631 Anemia in chronic kidney disease: Secondary | ICD-10-CM | POA: Diagnosis not present

## 2015-12-03 DIAGNOSIS — D649 Anemia, unspecified: Secondary | ICD-10-CM | POA: Diagnosis not present

## 2015-12-03 HISTORY — PX: PARATHYROIDECTOMY: SHX19

## 2015-12-03 LAB — POCT I-STAT 4, (NA,K, GLUC, HGB,HCT)
GLUCOSE: 77 mg/dL (ref 65–99)
HCT: 34 % — ABNORMAL LOW (ref 39.0–52.0)
Hemoglobin: 11.6 g/dL — ABNORMAL LOW (ref 13.0–17.0)
Potassium: 3.9 mmol/L (ref 3.5–5.1)
Sodium: 139 mmol/L (ref 135–145)

## 2015-12-03 LAB — MRSA PCR SCREENING: MRSA BY PCR: NEGATIVE

## 2015-12-03 SURGERY — PARATHYROIDECTOMY, WITH AUTOGRAFT TRANSPLANT
Anesthesia: General | Site: Neck

## 2015-12-03 MED ORDER — DEXAMETHASONE SODIUM PHOSPHATE 10 MG/ML IJ SOLN
INTRAMUSCULAR | Status: AC
  Filled 2015-12-03: qty 1

## 2015-12-03 MED ORDER — ACETAMINOPHEN 650 MG RE SUPP: 650.0000 mg | Freq: Four times a day (QID) | RECTAL | Status: DC | PRN

## 2015-12-03 MED ORDER — FENTANYL CITRATE (PF) 100 MCG/2ML IJ SOLN
INTRAMUSCULAR | Status: DC | PRN
  Administered 2015-12-03 (×2): 50 ug via INTRAVENOUS
  Administered 2015-12-03: 100 ug via INTRAVENOUS
  Administered 2015-12-03 (×6): 50 ug via INTRAVENOUS
  Administered 2015-12-03: 100 ug via INTRAVENOUS

## 2015-12-03 MED ORDER — PROMETHAZINE HCL 25 MG/ML IJ SOLN: 6.2500 mg | INTRAMUSCULAR | Status: DC | PRN

## 2015-12-03 MED ORDER — PROPOFOL 10 MG/ML IV BOLUS
INTRAVENOUS | Status: AC
  Filled 2015-12-03: qty 20

## 2015-12-03 MED ORDER — PHENYLEPHRINE HCL 10 MG/ML IJ SOLN
INTRAMUSCULAR | Status: DC | PRN
  Administered 2015-12-03 (×2): 40 ug via INTRAVENOUS

## 2015-12-03 MED ORDER — GLYCOPYRROLATE 0.2 MG/ML IV SOSY
PREFILLED_SYRINGE | INTRAVENOUS | Status: AC
  Filled 2015-12-03: qty 3

## 2015-12-03 MED ORDER — MENTHOL 3 MG MT LOZG
1.0000 | LOZENGE | OROMUCOSAL | Status: DC | PRN
  Administered 2015-12-03: 3 mg via ORAL
  Filled 2015-12-03: qty 9

## 2015-12-03 MED ORDER — HYDROCODONE-ACETAMINOPHEN 5-325 MG PO TABS
1.0000 | ORAL_TABLET | ORAL | Status: DC | PRN
  Administered 2015-12-04 – 2015-12-05 (×3): 2 via ORAL
  Filled 2015-12-03 (×2): qty 2
  Filled 2015-12-03: qty 1

## 2015-12-03 MED ORDER — PROPOFOL 10 MG/ML IV BOLUS
INTRAVENOUS | Status: DC | PRN
  Administered 2015-12-03: 20 mg via INTRAVENOUS
  Administered 2015-12-03: 180 mg via INTRAVENOUS

## 2015-12-03 MED ORDER — GI COCKTAIL ~~LOC~~
30.0000 mL | Freq: Once | ORAL | Status: AC
  Administered 2015-12-03: 30 mL via ORAL
  Filled 2015-12-03: qty 30

## 2015-12-03 MED ORDER — CALCIUM CARBONATE 1250 (500 CA) MG PO TABS
3.0000 | ORAL_TABLET | Freq: Three times a day (TID) | ORAL | Status: DC
  Administered 2015-12-04: 1500 mg via ORAL
  Filled 2015-12-03 (×2): qty 2

## 2015-12-03 MED ORDER — MIDAZOLAM HCL 5 MG/5ML IJ SOLN
INTRAMUSCULAR | Status: DC | PRN
  Administered 2015-12-03: 2 mg via INTRAVENOUS

## 2015-12-03 MED ORDER — PHENOL 1.4 % MT LIQD: 1.0000 | OROMUCOSAL | Status: DC | PRN

## 2015-12-03 MED ORDER — HEMOSTATIC AGENTS (NO CHARGE) OPTIME
TOPICAL | Status: DC | PRN
  Administered 2015-12-03: 1 via TOPICAL

## 2015-12-03 MED ORDER — CEFAZOLIN SODIUM-DEXTROSE 2-4 GM/100ML-% IV SOLN
2.0000 g | INTRAVENOUS | Status: AC
  Administered 2015-12-03: 2 g via INTRAVENOUS
  Filled 2015-12-03: qty 100

## 2015-12-03 MED ORDER — FENTANYL CITRATE (PF) 250 MCG/5ML IJ SOLN
INTRAMUSCULAR | Status: AC
  Filled 2015-12-03: qty 5

## 2015-12-03 MED ORDER — FENTANYL CITRATE (PF) 250 MCG/5ML IJ SOLN
INTRAMUSCULAR | Status: AC
Start: 2015-12-03 — End: 2015-12-03
  Filled 2015-12-03: qty 5

## 2015-12-03 MED ORDER — ROCURONIUM BROMIDE 50 MG/5ML IV SOLN
INTRAVENOUS | Status: AC
  Filled 2015-12-03: qty 1

## 2015-12-03 MED ORDER — HYDROMORPHONE HCL 1 MG/ML IJ SOLN: 0.2500 mg | INTRAMUSCULAR | Status: DC | PRN

## 2015-12-03 MED ORDER — SEVELAMER CARBONATE 800 MG PO TABS: 3200.0000 mg | ORAL_TABLET | Freq: Three times a day (TID) | ORAL | Status: DC

## 2015-12-03 MED ORDER — ACETAMINOPHEN 325 MG PO TABS
650.0000 mg | ORAL_TABLET | Freq: Four times a day (QID) | ORAL | Status: DC | PRN
  Administered 2015-12-04: 650 mg via ORAL
  Filled 2015-12-03: qty 2

## 2015-12-03 MED ORDER — NEOSTIGMINE METHYLSULFATE 10 MG/10ML IV SOLN
INTRAVENOUS | Status: DC | PRN
  Administered 2015-12-03: 3 mg via INTRAVENOUS

## 2015-12-03 MED ORDER — ONDANSETRON HCL 4 MG/2ML IJ SOLN
INTRAMUSCULAR | Status: AC
  Filled 2015-12-03: qty 2

## 2015-12-03 MED ORDER — ONDANSETRON HCL 4 MG/2ML IJ SOLN
INTRAMUSCULAR | Status: DC | PRN
  Administered 2015-12-03: 4 mg via INTRAVENOUS

## 2015-12-03 MED ORDER — HYDROMORPHONE HCL 1 MG/ML IJ SOLN
1.0000 mg | INTRAMUSCULAR | Status: DC | PRN
  Administered 2015-12-03 – 2015-12-04 (×3): 1 mg via INTRAVENOUS
  Filled 2015-12-03 (×3): qty 1

## 2015-12-03 MED ORDER — LISINOPRIL 20 MG PO TABS
20.0000 mg | ORAL_TABLET | Freq: Every day | ORAL | Status: DC
Start: 2015-12-03 — End: 2015-12-05
  Administered 2015-12-03 – 2015-12-04 (×2): 20 mg via ORAL
  Filled 2015-12-03 (×2): qty 1

## 2015-12-03 MED ORDER — GLYCOPYRROLATE 0.2 MG/ML IJ SOLN
INTRAMUSCULAR | Status: DC | PRN
  Administered 2015-12-03: .6 mg via INTRAVENOUS

## 2015-12-03 MED ORDER — LIDOCAINE 2% (20 MG/ML) 5 ML SYRINGE
INTRAMUSCULAR | Status: AC
  Filled 2015-12-03: qty 5

## 2015-12-03 MED ORDER — PHENYLEPHRINE 40 MCG/ML (10ML) SYRINGE FOR IV PUSH (FOR BLOOD PRESSURE SUPPORT)
PREFILLED_SYRINGE | INTRAVENOUS | Status: AC
Start: 2015-12-03 — End: 2015-12-03
  Filled 2015-12-03: qty 10

## 2015-12-03 MED ORDER — MIDAZOLAM HCL 2 MG/2ML IJ SOLN
INTRAMUSCULAR | Status: AC
  Filled 2015-12-03: qty 2

## 2015-12-03 MED ORDER — 0.9 % SODIUM CHLORIDE (POUR BTL) OPTIME
TOPICAL | Status: DC | PRN
  Administered 2015-12-03: 1000 mL

## 2015-12-03 MED ORDER — DEXAMETHASONE SODIUM PHOSPHATE 10 MG/ML IJ SOLN
INTRAMUSCULAR | Status: DC | PRN
  Administered 2015-12-03: 5 mg via INTRAVENOUS

## 2015-12-03 MED ORDER — DIPHENHYDRAMINE HCL 25 MG PO CAPS
25.0000 mg | ORAL_CAPSULE | Freq: Three times a day (TID) | ORAL | Status: DC | PRN
  Administered 2015-12-03 – 2015-12-05 (×3): 25 mg via ORAL
  Filled 2015-12-03 (×2): qty 1

## 2015-12-03 MED ORDER — ROCURONIUM BROMIDE 100 MG/10ML IV SOLN
INTRAVENOUS | Status: DC | PRN
  Administered 2015-12-03: 10 mg via INTRAVENOUS
  Administered 2015-12-03: 30 mg via INTRAVENOUS
  Administered 2015-12-03: 20 mg via INTRAVENOUS

## 2015-12-03 MED ORDER — SODIUM CHLORIDE 0.9 % IV SOLN
INTRAVENOUS | Status: DC
  Administered 2015-12-03 (×2): via INTRAVENOUS

## 2015-12-03 MED ORDER — NEOSTIGMINE METHYLSULFATE 5 MG/5ML IV SOSY
PREFILLED_SYRINGE | INTRAVENOUS | Status: AC
  Filled 2015-12-03: qty 5

## 2015-12-03 MED ORDER — ONDANSETRON HCL 4 MG/2ML IJ SOLN
4.0000 mg | Freq: Four times a day (QID) | INTRAMUSCULAR | Status: DC | PRN
  Administered 2015-12-03: 4 mg via INTRAVENOUS
  Filled 2015-12-03: qty 2

## 2015-12-03 MED ORDER — ONDANSETRON 4 MG PO TBDP: 4.0000 mg | ORAL_TABLET | Freq: Four times a day (QID) | ORAL | Status: DC | PRN

## 2015-12-03 MED ORDER — LIDOCAINE HCL (CARDIAC) 20 MG/ML IV SOLN
INTRAVENOUS | Status: DC | PRN
  Administered 2015-12-03: 60 mg via INTRAVENOUS

## 2015-12-03 MED ORDER — CALCITRIOL 0.5 MCG PO CAPS
0.5000 ug | ORAL_CAPSULE | Freq: Every morning | ORAL | Status: DC
  Administered 2015-12-04: 0.5 ug via ORAL

## 2015-12-03 SURGICAL SUPPLY — 59 items
APL SKNCLS STERI-STRIP NONHPOA (GAUZE/BANDAGES/DRESSINGS) ×1
ATTRACTOMAT 16X20 MAGNETIC DRP (DRAPES) ×3 IMPLANT
BANDAGE ACE 4X5 VEL STRL LF (GAUZE/BANDAGES/DRESSINGS) ×2 IMPLANT
BANDAGE ELASTIC 4 VELCRO ST LF (GAUZE/BANDAGES/DRESSINGS) ×3 IMPLANT
BENZOIN TINCTURE PRP APPL 2/3 (GAUZE/BANDAGES/DRESSINGS) ×4 IMPLANT
BLADE SURG 10 STRL SS (BLADE) ×3 IMPLANT
BLADE SURG 15 STRL LF DISP TIS (BLADE) ×2 IMPLANT
BLADE SURG 15 STRL SS (BLADE) ×6
BLADE SURG ROTATE 9660 (MISCELLANEOUS) IMPLANT
BNDG GAUZE ELAST 4 BULKY (GAUZE/BANDAGES/DRESSINGS) ×3 IMPLANT
CANISTER SUCTION 2500CC (MISCELLANEOUS) ×3 IMPLANT
CHLORAPREP W/TINT 10.5 ML (MISCELLANEOUS) ×6 IMPLANT
CLIP TI MEDIUM 24 (CLIP) ×3 IMPLANT
CLIP TI WIDE RED SMALL 24 (CLIP) ×3 IMPLANT
CLOSURE WOUND 1/2 X4 (GAUZE/BANDAGES/DRESSINGS) ×1
CONT SPEC 4OZ CLIKSEAL STRL BL (MISCELLANEOUS) ×24 IMPLANT
COVER SURGICAL LIGHT HANDLE (MISCELLANEOUS) ×3 IMPLANT
CRADLE DONUT ADULT HEAD (MISCELLANEOUS) ×3 IMPLANT
DRAPE LAPAROTOMY T 98X78 PEDS (DRAPES) ×6 IMPLANT
DRAPE SLUSH/WARMER DISC (DRAPES) ×2 IMPLANT
DRAPE UTILITY XL STRL (DRAPES) ×8 IMPLANT
ELECT CAUTERY BLADE 6.4 (BLADE) ×3 IMPLANT
ELECT REM PT RETURN 9FT ADLT (ELECTROSURGICAL) ×3
ELECTRODE REM PT RTRN 9FT ADLT (ELECTROSURGICAL) ×1 IMPLANT
GAUZE SPONGE 4X4 12PLY STRL (GAUZE/BANDAGES/DRESSINGS) ×1 IMPLANT
GAUZE SPONGE 4X4 16PLY XRAY LF (GAUZE/BANDAGES/DRESSINGS) ×3 IMPLANT
GLOVE BIOGEL PI IND STRL 8 (GLOVE) IMPLANT
GLOVE BIOGEL PI INDICATOR 8 (GLOVE) ×2
GLOVE ECLIPSE 8.0 STRL XLNG CF (GLOVE) ×2 IMPLANT
GLOVE SURG ORTHO 8.0 STRL STRW (GLOVE) ×6 IMPLANT
GOWN STRL REUS W/ TWL LRG LVL3 (GOWN DISPOSABLE) ×2 IMPLANT
GOWN STRL REUS W/ TWL XL LVL3 (GOWN DISPOSABLE) ×1 IMPLANT
GOWN STRL REUS W/TWL LRG LVL3 (GOWN DISPOSABLE) ×12
GOWN STRL REUS W/TWL XL LVL3 (GOWN DISPOSABLE) ×3
HEMOSTAT SURGICEL 2X4 FIBR (HEMOSTASIS) ×3 IMPLANT
IV SODIUM CHL 0.9 SLUSH (IV SOLUTION) ×1 IMPLANT
KIT BASIN OR (CUSTOM PROCEDURE TRAY) ×3 IMPLANT
KIT ROOM TURNOVER OR (KITS) ×3 IMPLANT
NS IRRIG 1000ML POUR BTL (IV SOLUTION) ×3 IMPLANT
PACK SURGICAL SETUP 50X90 (CUSTOM PROCEDURE TRAY) ×3 IMPLANT
PAD ARMBOARD 7.5X6 YLW CONV (MISCELLANEOUS) ×3 IMPLANT
PENCIL BUTTON HOLSTER BLD 10FT (ELECTRODE) ×3 IMPLANT
SPECIMEN JAR SMALL (MISCELLANEOUS) ×12 IMPLANT
SPONGE GAUZE 4X4 12PLY STER LF (GAUZE/BANDAGES/DRESSINGS) ×2 IMPLANT
SPONGE INTESTINAL PEANUT (DISPOSABLE) ×3 IMPLANT
STRIP CLOSURE SKIN 1/2X4 (GAUZE/BANDAGES/DRESSINGS) ×3 IMPLANT
SUT MNCRL AB 4-0 PS2 18 (SUTURE) ×6 IMPLANT
SUT PROLENE 4 0 RB 1 (SUTURE) ×3
SUT PROLENE 4-0 RB1 .5 CRCL 36 (SUTURE) ×1 IMPLANT
SUT SILK 2 0 (SUTURE) ×3
SUT SILK 2-0 18XBRD TIE 12 (SUTURE) ×1 IMPLANT
SUT VIC AB 3-0 SH 18 (SUTURE) ×6 IMPLANT
SYR BULB 3OZ (MISCELLANEOUS) ×3 IMPLANT
TAPE CLOTH SURG 4X10 WHT LF (GAUZE/BANDAGES/DRESSINGS) ×2 IMPLANT
TOWEL OR 17X24 6PK STRL BLUE (TOWEL DISPOSABLE) ×3 IMPLANT
TOWEL OR 17X26 10 PK STRL BLUE (TOWEL DISPOSABLE) ×3 IMPLANT
TUBE CONNECTING 12'X1/4 (SUCTIONS) ×1
TUBE CONNECTING 12X1/4 (SUCTIONS) ×2 IMPLANT
UNDERPAD 30X30 INCONTINENT (UNDERPADS AND DIAPERS) ×3 IMPLANT

## 2015-12-03 NOTE — Anesthesia Procedure Notes (Signed)
Procedure Name: Intubation Date/Time: 12/03/2015 11:48 AM Performed by: Freddie Breech Pre-anesthesia Checklist: Patient identified, Emergency Drugs available, Suction available, Patient being monitored and Timeout performed Patient Re-evaluated:Patient Re-evaluated prior to inductionOxygen Delivery Method: Circle system utilized Preoxygenation: Pre-oxygenation with 100% oxygen Intubation Type: IV induction Ventilation: Mask ventilation without difficulty and Oral airway inserted - appropriate to patient size Laryngoscope Size: Mac and 4 Grade View: Grade I Tube type: Oral Tube size: 7.0 mm Number of attempts: 1 Airway Equipment and Method: Stylet and Patient positioned with wedge pillow Placement Confirmation: ETT inserted through vocal cords under direct vision,  positive ETCO2,  CO2 detector and breath sounds checked- equal and bilateral Secured at: 23 cm Tube secured with: Tape Dental Injury: Teeth and Oropharynx as per pre-operative assessment

## 2015-12-03 NOTE — Interval H&P Note (Signed)
History and Physical Interval Note:  12/03/2015 10:32 AM  [name]  has presented today for surgery, with the diagnosis of Secondary hyperparathyroidism.   The various methods of treatment have been discussed with the patient and family. After consideration of risks, benefits and other options for treatment, the patient has consented to    Procedure(s): TOTAL PARATHYROIDECTOMY WITH  AUTOTRANSPLANT TO RIGHT FOREARM  (N/A) as a surgical intervention .    The patient's history has been reviewed, patient examined, no change in status, stable for surgery.  I have reviewed the patient's chart and labs.  Questions were answered to the patient's satisfaction.    Earnstine Regal, MD, [facility] Office: [place]

## 2015-12-03 NOTE — Anesthesia Postprocedure Evaluation (Signed)
Anesthesia Post Note  Patient: [name]  Procedure(s) Performed: Procedure(s) (LRB): TOTAL PARATHYROIDECTOMY WITH  AUTOTRANSPLANT TO RIGHT FOREARM  (N/A)  Patient location during evaluation: PACU Anesthesia Type: General Level of consciousness: awake Pain management: pain level controlled Vital Signs Assessment: post-procedure vital signs reviewed and stable Respiratory status: spontaneous breathing Cardiovascular status: stable Anesthetic complications: no    Last Vitals:  Filed Vitals:   12/03/15 1523 12/03/15 1548  BP: 174/88 165/92  Pulse: 91 83  Temp:  36.5 C  Resp: 23 19    Last Pain:  Filed Vitals:   12/03/15 1605  PainSc: 7                  EDWARDS,Zianne Schubring

## 2015-12-03 NOTE — Progress Notes (Signed)
Patient c/o chest pain on breathing. Dilaudid IV given. Called and notified Dr. Donne Hazel. Telephone orders given to do EKG and CXR stat.

## 2015-12-03 NOTE — Brief Op Note (Signed)
12/03/2015  2:17 PM  PATIENT:  [name]  30 y.o. male  PRE-OPERATIVE DIAGNOSIS:  Secondary hyperparathyroidism, ESRD  POST-OPERATIVE DIAGNOSIS:  Same  PROCEDURE:  Procedure(s): TOTAL PARATHYROIDECTOMY WITH  AUTOTRANSPLANT TO RIGHT FOREARM  (N/A)  (3 glands identified)  SURGEON:  Surgeon(s) and Role:    * Armandina Gemma, MD - Primary    * Jackolyn Confer, MD - Assisting  ANESTHESIA:   general  EBL:  Total I/O In: 500 [I.V.:500] Out: 75 [Blood:75]  BLOOD ADMINISTERED:none  DRAINS: none   LOCAL MEDICATIONS USED:  NONE  SPECIMEN:  Excision  DISPOSITION OF SPECIMEN:  PATHOLOGY  COUNTS:  YES  TOURNIQUET:  * No tourniquets in log *  DICTATION: .Other Dictation: Dictation Number [id number]  PLAN OF CARE: Admit to inpatient   PATIENT DISPOSITION:  PACU - hemodynamically stable.   Delay start of Pharmacological VTE agent (>24hrs) due to surgical blood loss or risk of bleeding: yes  Earnstine Regal, MD, [facility] Office: [phone]

## 2015-12-03 NOTE — Consult Note (Signed)
[facility] Renal Consultation Note  Indication for Consultation:  Management of ESRD/hemodialysis; anemia, hypertension/volume and secondary hyperparathyroidism  HPI: [name] is a 30 y.o. 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  Dr Harlow Asa reports only 3 glands found .  Now s/p surgery with wife in room ,c/o s of  discomfort as expected and hoarse voice. Post op renal panel pending.   Past Medical History  Diagnosis Date  . GERD (gastroesophageal reflux disease)   . GERD (gastroesophageal reflux disease)   . Heart murmur     ASYMPTOMATIC  . Blood dyscrasia   . Sickle cell trait ([place])   . Alport syndrome   . Kidney failure as a baby    went on dialysis age 70      T40  . Alport's syndrome     EFFECT KIDNEYS+ HEARING  . Headache   . Anemia     Past Surgical History  Procedure Laterality Date  . Kidney transplant    . Kidney transplant    . Nephrectomy transplanted organ    . Transplant kidney removed Right 03/20/2014  . Av fistula placement      LEFT ARM      Family History  Problem Relation Age of Onset  . Cancer Mother    Social History   reports that he quit smoking about 2 years ago. His smoking use included Cigarettes. He does not have any smokeless tobacco history on file. He reports that he does not drink alcohol or use illicit drugs.  No Known Allergies   ROS: no positives on ROS  expect for some mild itching and joint pain      Physical Exam: Filed Vitals:   12/03/15 1523 12/03/15 1548  BP: 174/88 165/92  Pulse: 91 83  Temp:  97.7 F (36.5 C)  Resp: 23 19     General:  thin AAM /post op nad /OX3 /sl lethargic as expected / soft slightly hoarse speech HEENT: [place] EOMI, MM dry  Neck:  surgical dressing midline dry  And clean ice pack in place / no jvd Heart: RRR, no mmur , rub or gallop Lungs: CTA / nonlabored breathing Abdomen: soft , NT, ND Extremities: no pedal edema Skin: no overt rash , warm dry Neuro: OX3, moves all extrem.  No overt focal deficits noted  Bulky dressing R arm over FA implant Dialysis Access:  Pos bruit LFA AVF   Results for orders placed or performed during the hospital encounter of 12/03/15 (from the past 48 hour(s))  I-STAT 4, (NA,K, GLUC, HGB,HCT)     Status: Abnormal   Collection Time: 12/03/15 10:00 AM  Result Value Ref Range   Sodium 139 135 - 145 mmol/L   Potassium 3.9 3.5 - 5.1 mmol/L   Glucose, Bld 77 65 - 99 mg/dL   HCT 34.0 (L) 39.0 - 52.0 %   Hemoglobin 11.6 (L) 13.0 - 17.0 g/dL   .   Dialysis Orders: Center: [facility] on TTS  . EDW 62.5 HD Bath 2.0 k, 2.0 ca   Time 4hrs Heparin 5800. Access LFA AVF     Calcitriol 3.0  mcg PO /HD  Mircera 50 mcg  q 2 weeks on hd     Other op  labs  HGB 11.5  Last mircera 11/22/15  Last  Ca 9.3  Phos 11.02   PTH 2939   Assessment/Plan 1. Severe secondary Hyperparathyroidism  S/p parathyroidectomy = f/u Calcium trend/  Using Renvela as binder  With last phos 11 change to po Oscal 3 ac  / use added ca bath  3.5 with hd /incr Calcitriol 3 ->24mcg  With HD  Anticipating Ca drop with last 3 PTH >2500 as op / Dr. Harlow Asa rx  2. ESRD -  HD TTS 3. Hypertension/volume  - was leaving sl below edw as op and tolerating / lower edw at dc / on Lisinopril $RemoveBefor'20mg'jzdsllpnISck$  q day as op ?? Compliance so monitor BP in hosp. May be able to taper off bp med in hosp. 4. Anemia  - hgb .> 11 last op and in hosp / no esa for now monitor  hgb  5. Nutrition - Renal diet and vit   Ernest Haber, PA-C [facility] Beeper [phone] 12/03/2015, 4:12 PM   I have seen and examined this patient and agree with plan and assessment in the above note with highlighted additions.  Pt with severe hyperparathyroidism, s/p PTH'ect - only 3  glands found (did get R FA autotransplant). No post op labs yet but renal panel ordered for Ca and K. Has HD orders for tomorrow. (could be done tonight if post op K high).  Planning to use added Ca bath, changing binder to ca carbonate. Increase calcitriol 3->5. Q12H labs until stable Abdul Beirne B,MD 12/03/2015 5:45 PM

## 2015-12-03 NOTE — Transfer of Care (Signed)
Immediate Anesthesia Transfer of Care Note  Patient: [name]  Procedure(s) Performed: Procedure(s): TOTAL PARATHYROIDECTOMY WITH  AUTOTRANSPLANT TO RIGHT FOREARM  (N/A)  Patient Location: PACU  Anesthesia Type:General  Level of Consciousness:  sedated, patient cooperative and responds to stimulation  Airway & Oxygen Therapy:Patient Spontanous Breathing and Patient connected to face mask oxgen  Post-op Assessment:  Report given to PACU RN and Post -op Vital signs reviewed and stable  Post vital signs:  Reviewed and stable  Last Vitals:  Filed Vitals:   12/03/15 0954 12/03/15 1414  BP: 187/117 185/98  Pulse: 91   Temp: 37.2 C 36.6 C  Resp: 18 19    Complications: No apparent anesthesia complications

## 2015-12-03 NOTE — Anesthesia Preprocedure Evaluation (Addendum)
Anesthesia Evaluation  Patient identified by MRN, date of birth, ID band Patient awake    Reviewed: Allergy & Precautions, NPO status , Patient's Chart, lab work & pertinent test results  Airway Mallampati: II  TM Distance: >3 FB Neck ROM: Full    Dental   Pulmonary shortness of breath, former smoker,    breath sounds clear to auscultation       Cardiovascular hypertension,  Rhythm:Regular Rate:Normal     Neuro/Psych    GI/Hepatic GERD  ,  Endo/Other  negative endocrine ROS  Renal/GU Renal disease     Musculoskeletal   Abdominal   Peds  Hematology   Anesthesia Other Findings   Reproductive/Obstetrics                            Anesthesia Physical Anesthesia Plan  ASA: III  Anesthesia Plan: General   Post-op Pain Management:    Induction: Intravenous  Airway Management Planned: Oral ETT  Additional Equipment:   Intra-op Plan:   Post-operative Plan: Possible Post-op intubation/ventilation  Informed Consent: I have reviewed the patients History and Physical, chart, labs and discussed the procedure including the risks, benefits and alternatives for the proposed anesthesia with the patient or authorized representative who has indicated his/her understanding and acceptance.   Dental advisory given  Plan Discussed with: CRNA and Anesthesiologist  Anesthesia Plan Comments:         Anesthesia Quick Evaluation

## 2015-12-04 ENCOUNTER — Encounter (HOSPITAL_COMMUNITY): Payer: Self-pay | Admitting: Surgery

## 2015-12-04 LAB — RENAL FUNCTION PANEL
ALBUMIN: 3.8 g/dL (ref 3.5–5.0)
ANION GAP: 15 (ref 5–15)
Albumin: 3.8 g/dL (ref 3.5–5.0)
Albumin: 3.7 g/dL (ref 3.5–5.0)
Anion gap: 16 — ABNORMAL HIGH (ref 5–15)
Anion gap: 14 (ref 5–15)
BUN: 17 mg/dL (ref 6–20)
BUN: 42 mg/dL — AB (ref 6–20)
BUN: 45 mg/dL — AB (ref 6–20)
CALCIUM: 7.9 mg/dL — AB (ref 8.9–10.3)
CHLORIDE: 97 mmol/L — AB (ref 101–111)
CHLORIDE: 94 mmol/L — AB (ref 101–111)
CO2: 20 mmol/L — AB (ref 22–32)
CO2: 25 mmol/L (ref 22–32)
CO2: 26 mmol/L (ref 22–32)
CREATININE: 13.9 mg/dL — AB (ref 0.61–1.24)
CREATININE: 14.88 mg/dL — AB (ref 0.61–1.24)
Calcium: 8 mg/dL — ABNORMAL LOW (ref 8.9–10.3)
Calcium: 9.3 mg/dL (ref 8.9–10.3)
Chloride: 101 mmol/L (ref 101–111)
Creatinine, Ser: 8.44 mg/dL — ABNORMAL HIGH (ref 0.61–1.24)
GFR calc Af Amer: 5 mL/min — ABNORMAL LOW (ref >60)
GFR calc Af Amer: 4 mL/min — ABNORMAL LOW (ref >60)
GFR calc Af Amer: 9 mL/min — ABNORMAL LOW (ref >60)
GFR calc non Af Amer: 4 mL/min — ABNORMAL LOW (ref >60)
GFR calc non Af Amer: 4 mL/min — ABNORMAL LOW (ref >60)
GFR, EST NON AFRICAN AMERICAN: 8 mL/min — AB (ref >60)
GLUCOSE: 98 mg/dL (ref 65–99)
GLUCOSE: 96 mg/dL (ref 65–99)
Glucose, Bld: 93 mg/dL (ref 65–99)
PHOSPHORUS: 5 mg/dL — AB (ref 2.5–4.6)
POTASSIUM: 4.8 mmol/L (ref 3.5–5.1)
POTASSIUM: 4.1 mmol/L (ref 3.5–5.1)
Phosphorus: 5.6 mg/dL — ABNORMAL HIGH (ref 2.5–4.6)
Phosphorus: 4.6 mg/dL (ref 2.5–4.6)
Potassium: 6.1 mmol/L (ref 3.5–5.1)
SODIUM: 136 mmol/L (ref 135–145)
Sodium: 139 mmol/L (ref 135–145)
Sodium: 133 mmol/L — ABNORMAL LOW (ref 135–145)

## 2015-12-04 LAB — CBC
HEMATOCRIT: 30.4 % — AB (ref 39.0–52.0)
Hemoglobin: 9.9 g/dL — ABNORMAL LOW (ref 13.0–17.0)
MCH: 26 pg (ref 26.0–34.0)
MCHC: 32.6 g/dL (ref 30.0–36.0)
MCV: 79.8 fL (ref 78.0–100.0)
PLATELETS: 118 10*3/uL — AB (ref 150–400)
RBC: 3.81 MIL/uL — ABNORMAL LOW (ref 4.22–5.81)
RDW: 18.2 % — AB (ref 11.5–15.5)
WBC: 7.8 10*3/uL (ref 4.0–10.5)

## 2015-12-04 MED ORDER — CALCITRIOL 0.25 MCG PO CAPS
ORAL_CAPSULE | ORAL | Status: AC
  Filled 2015-12-04: qty 2

## 2015-12-04 MED ORDER — CALCIUM CARBONATE 1250 (500 CA) MG PO TABS
1500.0000 mg | ORAL_TABLET | Freq: Three times a day (TID) | ORAL | Status: DC
  Administered 2015-12-04 – 2015-12-05 (×3): 1500 mg via ORAL
  Filled 2015-12-04 (×4): qty 2

## 2015-12-04 MED ORDER — DIPHENHYDRAMINE HCL 25 MG PO CAPS
ORAL_CAPSULE | ORAL | Status: AC
  Filled 2015-12-04: qty 1

## 2015-12-04 MED ORDER — HYDROCODONE-ACETAMINOPHEN 5-325 MG PO TABS
ORAL_TABLET | ORAL | Status: AC
  Filled 2015-12-04: qty 2

## 2015-12-04 MED ORDER — CALCITRIOL 0.5 MCG PO CAPS
5.0000 ug | ORAL_CAPSULE | ORAL | Status: DC
Start: 2015-12-04 — End: 2015-12-05
  Administered 2015-12-04: 5 ug via ORAL
  Filled 2015-12-04 (×2): qty 10

## 2015-12-04 NOTE — Procedures (Signed)
I have personally attended this patient's dialysis session.   No heparin Ca 8 - on added Ca bath Attempting UF to 62 kg  Jamal Maes, MD [place] Pager 12/04/2015, 11:23 AM

## 2015-12-04 NOTE — Op Note (Signed)
[name], [name] NO.:  [id number]  MEDICAL RECORD NO.:  [id number]  LOCATION:  [id number]                        FACILITY:  [facility]  PHYSICIAN:  Earnstine Regal, MD      DATE OF BIRTH:  June 28, 1985  DATE OF PROCEDURE:  12/03/2015                              OPERATIVE REPORT   PREOPERATIVE DIAGNOSES:  Secondary hyperparathyroidism, end-stage renal disease.  POSTOPERATIVE DIAGNOSES:  Secondary hyperparathyroidism, end-stage renal disease.  PROCEDURES: 1. Neck exploration with total parathyroidectomy (three glands). 2. Autotransplantation parathyroid tissue, right brachioradialis     muscle.  SURGEON:  Earnstine Regal, MD, FACS  ASSISTANT:  Odis Hollingshead, MD, FACS  ANESTHESIA:  General.  ESTIMATED BLOOD LOSS:  Minimal.  PREPARATION:  ChloraPrep.  COMPLICATIONS:  None.  INDICATIONS:  The patient is a 30 year old male referred by Dr. Jeneen Rinks Deterding, with secondary hyperparathyroidism and end-stage renal disease.  The patient had been on hemodialysis for 4-5 years before undergoing renal transplantation at [facility] in 2009.  The patient had graft failure 2 years ago and has been back on hemodialysis for the past 2 years.  He has an access in the left forearm.  He dialyzes on Tuesdays, Thursdays, and Saturdays at the [facility].  The patient has developed secondary hyperparathyroidism with a PTH level of 2737 and a phosphorus level elevated at 8.1 with a calcium times phosphorus product of 78.  The patient now comes to Surgery for parathyroidectomy.  BODY OF REPORT:  Procedure was done in OR #9 at the [facility].  The patient was brought to the operating room and placed in supine position on the operating room table.  Following administration of general anesthesia, the patient was positioned and then prepped and draped in the usual aseptic fashion.  After ascertaining that an adequate level of anesthesia had been achieved,  a Kocher incision was made with a #15 blade.  Dissection was carried through the subcutaneous tissues and platysma.  Hemostasis was achieved with electrocautery.  Skin flaps were elevated cephalad and caudad and a Mahorner self-retaining retractor was placed for exposure.  Strap muscles were incised in the midline.  Dissection was begun on the left side.  Strap muscles were reflected laterally exposing a normal left thyroid lobe.  Just inferior to the left lobe of the thyroid, was a 1.5-cm nodular mass.  This was gently dissected out.  Vascular tributaries were divided between small Ligaclips.  Care was taken to avoid the recurrent nerve.  The mass was excised in its entirety.  Biopsy was submitted to Pathology, which confirmed hypercellular parathyroid tissue.  Remainder of the gland was placed in iced saline on the back table.  Further dissection on the left revealed a nodular mass, which was relatively small just above the level of the inferior thyroid artery. This was gently dissected out and excised in its entirety.  The vascular pedicle was divided between small Ligaclips.  The gland measured approximately 5 mm in diameter.  A fragment was sent for frozen section, which confirmed hypercellular parathyroid tissue.  Remainder of the gland appeared grossly normal and was placed on iced saline on the back table.  Dry pack  was placed in the left neck.  Next, we turned our attention to the right side of the neck.  Strap muscles were reflected laterally.  Right thyroid lobe was mobilized and appeared normal.  Palpation revealed no abnormal masses on first exam. Right lobe was then mobilized by dividing the middle thyroid vein and rolling the lobe further medially.  Palpation revealed a nodular mass alongside the esophagus in the inferior position.  However, this mass was quite deep in the neck.  It was gently mobilized.  Small arterial branch was divided between Ligaclips.  Recurrent  laryngeal nerve was identified and dissected off the mass and preserved medially.  The mass was excised.  It measured approximately 2 cm in diameter.  Biopsy was submitted to Pathology, which confirmed hypercellular parathyroid tissue.  Remainder of the gland was placed in iced saline on the back table.  Further dissection was then performed in the right neck with hopes of identifying an enlarged parathyroid gland.  The entire superior pole was dissected out.  There was no evidence of a superior parathyroid gland visualized.  Dissection was carried back to the hypopharynx.  Dissection was carried down to the precervical fascia.  The retroesophageal space was widely opened.  The retroesophageal space was explored and no evidence of an enlarged parathyroid was identified.  Therefore, the area around the inferior thyroid artery was completely dissected out.  The recurrent laryngeal nerve was identified and dissected out for approximately 2-3 cm along its length on the esophagus.  No further evidence of an enlarged parathyroid was found.  Dissection was then carried distally, encased the gland identified, represented a descended superior gland.  Inferiorly, again, no sign of parathyroid tissue was identified.  The entire right thyrothymic tract was excised.  The right carotid sheath was opened and explored.  There was no obvious enlarged parathyroid tissue identified.  Decision was made at this point to discontinue further dissection.  We discussed right thyroid lobectomy, but opted not to perform that procedure in this young patient.  Neck was irrigated with warm saline and good hemostasis was noted.  Fibrillar was placed throughout the operative field.  Strap muscles were reapproximated in the midline with interrupted 3-0 Vicryl sutures. Platysma was closed with interrupted 3-0 Vicryl sutures.  Skin was closed with a running 4-0 Monocryl subcuticular suture.  Wound was washed and dried,  and benzoin and Steri-Strips were applied.  Sterile dressings were applied.  Next, the right arm was positioned on an armboard.  It was prepped and draped in the usual aseptic fashion.  After ascertaining that an adequate level of anesthesia had been maintained, the left superior parathyroid gland, which was the most normal of the three glands removed was selected.  It was prepared by cutting it into eight 1-mm fragments. This essentially took all of the remaining gland.  These fragments were kept in iced saline in preparation for transplantation.  Next, an incision was made over the right brachioradialis muscle. Dissection was carried through subcutaneous tissues.  Skin flaps were developed circumferentially and a small Weitlaner retractor placed for exposure.  The eight fragments of parathyroid tissue were then implanted into the brachioradialis muscle individually making an incision with a #15 blade, dissecting a muscular pocket with a mosquito hemostat, inserting a 1-mm fragment, and closing the overlying fascia with an interrupted 4-0 Prolene suture.  This exercise was repeated eight times.  Good hemostasis was noted.  Subcutaneous tissues were closed with interrupted 3-0 Vicryl sutures.  Skin was closed  with a running 4-0 Monocryl subcuticular suture.  Wound was washed and dried, and benzoin and Steri-Strips were applied.  Sterile dressings were applied.  The patient was awakened from anesthesia and brought to the recovery room. The patient tolerated the procedure well.   Earnstine Regal, MD, [facility] Office: [phone]   TMG/MEDQ  D:  12/03/2015  T:  12/04/2015  Job:  [id number]  cc:   Jeneen Rinks L. Deterding, M.D.

## 2015-12-04 NOTE — Progress Notes (Signed)
Patient ID: [name], male   DOB: 07-16-1985, 30 y.o.   MRN: [id number]  [facility]  Subjective: POD#1 - patient in HD.  Comfortable. No complaints - mild chest wall pain.  Taking limited po liquids.  Objective: Vital signs in last 24 hours: Temp:  [97.7 F (36.5 C)-99 F (37.2 C)] 98.2 F (36.8 C) (06/20 0645) Pulse Rate:  [75-101] 79 (06/20 0730) Resp:  [12-24] 17 (06/20 0730) BP: (149-187)/(76-117) 166/104 mmHg (06/20 0730) SpO2:  [97 %-100 %] 98 % (06/20 0645) Weight:  [63.05 kg (139 lb)] 63.05 kg (139 lb) (06/19 1006) Last BM Date: 12/03/15  Intake/Output from previous day: 06/19 0701 - 06/20 0700 In: 900 [I.V.:900] Out: 75 [Blood:75] Intake/Output this shift:    Physical Exam: HEENT - sclerae clear, mucous membranes moist Neck - dressing dry and intact; voice soft by normal Chest - clear bilaterally Cor - RRR Ext - no edema, non-tender; dressing right forearm dry and intact Neuro - alert & oriented, no focal deficits  Lab Results:   Recent Labs  12/03/15 1000 12/04/15 0724  WBC  --  7.8  HGB 11.6* 9.9*  HCT 34.0* 30.4*  PLT  --  PENDING   BMET  Recent Labs  12/03/15 2352 12/04/15 0540  NA 136 139  K 6.1* 4.8  CL 101 97*  CO2 20* 26  GLUCOSE 98 96  BUN 42* 45*  CREATININE 13.90* 14.88*  CALCIUM 7.9* 8.0*   PT/INR No results for input(s): LABPROT, INR in the last 72 hours. Comprehensive Metabolic Panel:    Component Value Date/Time   NA 139 12/04/2015 0540   NA 136 12/03/2015 2352   K 4.8 12/04/2015 0540   K 6.1* 12/03/2015 2352   CL 97* 12/04/2015 0540   CL 101 12/03/2015 2352   CO2 26 12/04/2015 0540   CO2 20* 12/03/2015 2352   BUN 45* 12/04/2015 0540   BUN 42* 12/03/2015 2352   CREATININE 14.88* 12/04/2015 0540   CREATININE 13.90* 12/03/2015 2352   GLUCOSE 96 12/04/2015 0540   GLUCOSE 98 12/03/2015 2352   CALCIUM 8.0* 12/04/2015 0540   CALCIUM 7.9* 12/03/2015 2352   AST 22 11/23/2013  2153   AST 17 05/27/2009 0234   ALT 22 11/23/2013 2153   ALT 17 05/27/2009 0234   ALKPHOS 111 11/23/2013 2153   ALKPHOS 155* 05/27/2009 0234   BILITOT 0.3 11/23/2013 2153   BILITOT 0.5 05/27/2009 0234   PROT 8.3 11/23/2013 2153   PROT 7.6 05/27/2009 0234   ALBUMIN 3.8 12/04/2015 0540   ALBUMIN 3.8 12/03/2015 2352    Studies/Results: Dg Chest 2 View  12/03/2015  CLINICAL DATA:  Acute onset of generalized chest pain, status post recent total parathyroidectomy with autotransplant to right forearm. Initial encounter. EXAM: CHEST  2 VIEW COMPARISON:  Chest radiograph performed 04/26/2014 FINDINGS: The lungs are well-aerated and clear. There is no evidence of focal opacification, pleural effusion or pneumothorax. The heart is borderline enlarged. No acute osseous abnormalities are seen. IMPRESSION: Borderline cardiomegaly.  Lungs remain grossly clear. Electronically Signed   By: Garald Balding M.D.   On: 12/03/2015 20:11    Assessment & Plans: Status post total parathyroidectomy with autotransplant (3 glands)  Fall in calcium to 7.9 (expected greater), fall in phos level  Pain controlled  Advance diet as tolerated  Per renal regarding timing of discharge - likely Thursday  Earnstine Regal, MD, [facility] Office: [place]  12/04/2015    

## 2015-12-04 NOTE — Progress Notes (Signed)
[facility] Rounding Note  Subjective/Interval Events  Objective Vital signs in last 24 hours: Filed Vitals:   12/04/15 0859 12/04/15 0930 12/04/15 1000 12/04/15 1030  BP: 157/85 175/82 156/80   Pulse: 64 74 65 82  Temp:      TempSrc:      Resp: $Remo'21 15 18 20  'JPOpq$ Height:      Weight:      SpO2:       Weight change:   Intake/Output Summary (Last 24 hours) at 12/04/15 1113 Last data filed at 12/04/15 0847  Gross per 24 hour  Intake    900 ml  Output     75 ml  Net    825 ml   Physical Exam:  BP 156/80 mmHg  Pulse 82  Temp(Src) 98.2 F (36.8 C) (Oral)  Resp 20  Ht $R'5\' 9"'pY$  (1.753 m)  Wt 65.5 kg (144 lb 6.4 oz)  BMI 21.31 kg/m2  SpO2 98% Small framed AAM - seen in HD Dressing over neck incision site Voice good Lungs clear S1S2 normal, no S3 Abd soft and not tender R FA wrapped (implant) No edema of LE's  Labs: Basic Metabolic Panel:  Recent Labs Lab 12/03/15 1000 12/03/15 2352 12/04/15 0540  NA 139 136 139  K 3.9 6.1* 4.8  CL  --  101 97*  CO2  --  20* 26  GLUCOSE 77 98 96  BUN  --  42* 45*  CREATININE  --  13.90* 14.88*  CALCIUM  --  7.9* 8.0*  PHOS  --  5.0* 5.6*     Recent Labs Lab 12/03/15 2352 12/04/15 0540  ALBUMIN 3.8 3.8   CBC:  Recent Labs Lab 12/03/15 1000 12/04/15 0724  WBC  --  7.8  HGB 11.6* 9.9*  HCT 34.0* 30.4*  MCV  --  79.8  PLT  --  118*   Studies/Results: Dg Chest 2 View  12/03/2015  CLINICAL DATA:  Acute onset of generalized chest pain, status post recent total parathyroidectomy with autotransplant to right forearm. Initial encounter. EXAM: CHEST  2 VIEW COMPARISON:  Chest radiograph performed 04/26/2014 FINDINGS: The lungs are well-aerated and clear. There is no evidence of focal opacification, pleural effusion or pneumothorax. The heart is borderline enlarged. No acute osseous abnormalities are seen. IMPRESSION: Borderline cardiomegaly.  Lungs remain grossly clear. Electronically Signed   By: Garald Balding M.D.   On: 12/03/2015  20:11   Medications: . sodium chloride 10 mL/hr at 12/03/15 1019   . calcitRIOL  0.5 mcg Oral q morning - 10a  . calcium carbonate  3 tablet Oral TID WC  . lisinopril  20 mg Oral QHS   Dialysis Orders: Center: [facility] on TTS . EDW 62.5 HD Bath 2.0 k, 2.0 ca Time 4hrs Heparin 5800. Access LFA AVF  Calcitriol 3.0 mcg PO /HD Mircera 50 mcg q 2 weeks on hd  Other op labs HGB 11.5 Last mircera 11/22/15 Last Ca 9.3 Phos 11.02 PTH 2939   Assessment/Plan 1. Severe hyperparathyroidism - s/p PTH'ect - only 3 glands found. Ca did drop but with pre-op PTH as high as 2900 would have expected more dramatic drop in serum Ca. Using added Ca bath with HD. Oscal 3 tabs tid with meals. Calcitriol dose with HD increased to 5 mcg (will stop the daily calcitriol) 2. ESRD - HD TTS 3. Hypertension/volume - pre weight 65.5 kg. EDW 62.3. Has been leaving slightly under EDW as outpt and toleraeting. Reassess while here. 4. Anemia - Hb 11.6->9.9.  On Mircera 5 Q2wks.   5. Nutrition - Renal diet and vit  Jamal Maes, MD [facility] [phone] pager 12/04/2015, 11:13 AM

## 2015-12-05 LAB — RENAL FUNCTION PANEL
Albumin: 3.4 g/dL — ABNORMAL LOW (ref 3.5–5.0)
Anion gap: 12 (ref 5–15)
BUN: 31 mg/dL — AB (ref 6–20)
CHLORIDE: 95 mmol/L — AB (ref 101–111)
CO2: 28 mmol/L (ref 22–32)
CREATININE: 9.93 mg/dL — AB (ref 0.61–1.24)
Calcium: 9.3 mg/dL (ref 8.9–10.3)
GFR calc non Af Amer: 6 mL/min — ABNORMAL LOW (ref >60)
GFR, EST AFRICAN AMERICAN: 7 mL/min — AB (ref >60)
Glucose, Bld: 76 mg/dL (ref 65–99)
Phosphorus: 6.4 mg/dL — ABNORMAL HIGH (ref 2.5–4.6)
Potassium: 5 mmol/L (ref 3.5–5.1)
Sodium: 135 mmol/L (ref 135–145)

## 2015-12-05 MED ORDER — DARBEPOETIN ALFA 25 MCG/0.42ML IJ SOSY
25.0000 ug | PREFILLED_SYRINGE | INTRAMUSCULAR | Status: DC
Start: 2015-12-06 — End: 2015-12-05

## 2015-12-05 MED ORDER — NEPRO/CARBSTEADY PO LIQD
237.0000 mL | Freq: Two times a day (BID) | ORAL | Status: DC
  Administered 2015-12-05: 237 mL via ORAL

## 2015-12-05 NOTE — Progress Notes (Signed)
Discharge instruction given to patient,verbalized understading

## 2015-12-05 NOTE — Progress Notes (Signed)
Patient ID: [name], male   DOB: 07-26-1985, 30 y.o.   MRN: [id number]  [facility]  Subjective: POD#2 - patient in bed, comfortable, wife at bedside.  No complaints.  Objective: Vital signs in last 24 hours: Temp:  [98 F (36.7 C)-98.6 F (37 C)] 98.6 F (37 C) (06/21 0401) Pulse Rate:  [64-82] 81 (06/21 0401) Resp:  [13-24] 20 (06/21 0401) BP: (156-187)/(80-99) 178/99 mmHg (06/21 0401) SpO2:  [99 %-100 %] 100 % (06/21 0401) Weight:  [62.2 kg (137 lb 2 oz)] 62.2 kg (137 lb 2 oz) (06/20 1110) Last BM Date: 12/03/15  Intake/Output from previous day: 06/20 0701 - 06/21 0700 In: 540 [P.O.:540] Out: 3000  Intake/Output this shift:    Physical Exam: HEENT - sclerae clear, mucous membranes moist Neck - wound dry and intact; normal voice; minimal STS Chest - clear bilaterally Cor - RRR Ext - no edema, non-tender; right forearm wound dry and intact Neuro - alert & oriented, no focal deficits  Lab Results:   Recent Labs  12/03/15 1000 12/04/15 0724  WBC  --  7.8  HGB 11.6* 9.9*  HCT 34.0* 30.4*  PLT  --  118*   BMET  Recent Labs  12/04/15 1832 12/05/15 0448  NA 133* 135  K 4.1 5.0  CL 94* 95*  CO2 25 28  GLUCOSE 93 76  BUN 17 31*  CREATININE 8.44* 9.93*  CALCIUM 9.3 9.3   PT/INR No results for input(s): LABPROT, INR in the last 72 hours. Comprehensive Metabolic Panel:    Component Value Date/Time   NA 135 12/05/2015 0448   NA 133* 12/04/2015 1832   K 5.0 12/05/2015 0448   K 4.1 12/04/2015 1832   CL 95* 12/05/2015 0448   CL 94* 12/04/2015 1832   CO2 28 12/05/2015 0448   CO2 25 12/04/2015 1832   BUN 31* 12/05/2015 0448   BUN 17 12/04/2015 1832   CREATININE 9.93* 12/05/2015 0448   CREATININE 8.44* 12/04/2015 1832   GLUCOSE 76 12/05/2015 0448   GLUCOSE 93 12/04/2015 1832   CALCIUM 9.3 12/05/2015 0448   CALCIUM 9.3 12/04/2015 1832   AST 22 11/23/2013 2153   AST 17 05/27/2009 0234   ALT 22 11/23/2013 2153    ALT 17 05/27/2009 0234   ALKPHOS 111 11/23/2013 2153   ALKPHOS 155* 05/27/2009 0234   BILITOT 0.3 11/23/2013 2153   BILITOT 0.5 05/27/2009 0234   PROT 8.3 11/23/2013 2153   PROT 7.6 05/27/2009 0234   ALBUMIN 3.4* 12/05/2015 0448   ALBUMIN 3.7 12/04/2015 1832    Studies/Results: Dg Chest 2 View  12/03/2015  CLINICAL DATA:  Acute onset of generalized chest pain, status post recent total parathyroidectomy with autotransplant to right forearm. Initial encounter. EXAM: CHEST  2 VIEW COMPARISON:  Chest radiograph performed 04/26/2014 FINDINGS: The lungs are well-aerated and clear. There is no evidence of focal opacification, pleural effusion or pneumothorax. The heart is borderline enlarged. No acute osseous abnormalities are seen. IMPRESSION: Borderline cardiomegaly.  Lungs remain grossly clear. Electronically Signed   By: Garald Balding M.D.   On: 12/03/2015 20:11    Assessment & Plans: Status post total parathyroidectomy with autotransplant (3 glands) Fall in calcium to 7.9 (expected greater), now 9.3 and stable Pain controlled Advance diet as tolerated Home per renal service - likely tomorrow after HD  Earnstine Regal, MD, [facility] Office: [place] 12/05/2015

## 2015-12-05 NOTE — Discharge Summary (Signed)
Physician Discharge Summary [facility]  Patient ID: [name] MRN: [id number] DOB/AGE: 30/30/1987 30 y.o.  Admit date: 12/03/2015 Discharge date: 12/05/2015  Admission Diagnoses:  Secondary hyperparathyroidism, ESRD  Discharge Diagnoses:  Principal Problem:   Hyperparathyroidism, secondary ([place]) Active Problems:   ESRD on dialysis [facility])   Secondary hyperparathyroidism of renal origin [facility])   Discharged Condition: good  Hospital Course: Patient was admitted for observation following parathyroid surgery.  Post op course was uncomplicated.  Pain was well controlled.  Tolerated diet.  Post op calcium level on morning following surgery was 7.9 mg/dl.  Patient was prepared for discharge home on POD#2.  Consults: nephrology  Treatments: surgery: total parathyroidectomy with autotransplantation  Discharge Exam: Blood pressure 169/103, pulse 85, temperature 98.2 F (36.8 C), temperature source Oral, resp. rate 18, height $RemoveBe'5\' 9"'uDQwKoeWg$  (1.753 m), weight 62.2 kg (137 lb 2 oz), SpO2 100 %. See progress note this AM.   Disposition: Home  Discharge Instructions    Diet - low sodium heart healthy    Complete by:  As directed      Discharge instructions    Complete by:  As directed   [facility]  THYROID & PARATHYROID SURGERY:  POST-OP INSTRUCTIONS  Always review your discharge instruction sheet from the facility where your surgery was performed.  A prescription for pain medication may be given to you upon discharge.  Take your pain medication as prescribed.  If narcotic pain medicine is not needed, then you may take acetaminophen (Tylenol) or ibuprofen (Advil) as needed.  Take your usually prescribed medications unless otherwise directed.  If you need a refill on your pain medication, please contact your pharmacy. They will contact our office to request authorization.  Prescriptions will not be processed by our office after 5 pm or on  weekends.  Start with a light diet upon arrival home, such as soup and crackers or toast.  Be sure to drink plenty of fluids daily.  Resume your normal diet the day after surgery.  Most patients will experience some swelling and bruising on the chest and neck area.  Ice packs will help.  Swelling and bruising can take several days to resolve.   It is common to experience some constipation after surgery.  Increasing fluid intake and taking a stool softener will usually help or prevent this problem.  A mild laxative (Milk of Magnesia or Miralax) should be taken according to package directions if there has been no bowel movement after 48 hours.  You have steri-strips and a gauze dressing over your incision.  You may remove the gauze bandage on the second day after surgery, and you may shower at that time.  Leave your steri-strips (small skin tapes) in place directly over the incision.  These strips should remain on the skin for 5-7 days and then be removed.  You may get them wet in the shower and pat them dry.  You may resume regular (light) daily activities beginning the next day - such as daily self-care, walking, climbing stairs - gradually increasing activities as tolerated.  You may have sexual intercourse when it is comfortable.  Refrain from any heavy lifting or straining until approved by your doctor.  You may drive when you no longer are taking prescription pain medication, you can comfortably wear a seatbelt, and you can safely maneuver your car and apply brakes.  You should see your doctor in the office for a follow-up appointment approximately two to three weeks  after your surgery.  Make sure that you call for this appointment within a day or two after you arrive home to insure a convenient appointment time.  WHEN TO CALL YOUR DOCTOR: -- Fever greater than 101.5 -- Inability to urinate -- Nausea and/or vomiting - persistent -- Extreme swelling or bruising -- Continued bleeding from  incision -- Increased pain, redness, or drainage from the incision -- Difficulty swallowing or breathing -- Muscle cramping or spasms -- Numbness or tingling in hands or around lips  The clinic staff is available to answer your questions during regular business hours.  Please don't hesitate to call and ask to speak to one of the nurses if you have concerns.  Earnstine Regal, MD, [facility] Office: [phone]  Website: [email or web address]     Increase activity slowly    Complete by:  As directed      No dressing needed    Complete by:  As directed             Medication List    TAKE these medications        acetaminophen 325 MG tablet  Commonly known as:  TYLENOL  Take 325 mg by mouth daily as needed.     calcitRIOL 0.25 MCG capsule  Commonly known as:  ROCALTROL  Take 0.5 mcg by mouth daily.     diphenhydrAMINE 25 MG tablet  Commonly known as:  SOMINEX  Take 25 mg by mouth at bedtime as needed for itching or sleep.     epoetin alfa 2000 UNIT/ML injection  Commonly known as:  EPOGEN,PROCRIT  Inject 4,000 Units into the skin 3 (three) times a week.     lisinopril 10 MG tablet  Commonly known as:  PRINIVIL,ZESTRIL  Take 20 mg by mouth at bedtime.     multivitamin Tabs tablet  Take 1 tablet by mouth at bedtime.     sevelamer carbonate 800 MG tablet  Commonly known as:  RENVELA  Take 3,200 mg by mouth 3 (three) times daily with meals.           Follow-up Information    Follow up with Earnstine Regal, MD. Schedule an appointment as soon as possible for a visit in 3 weeks.   Specialty:  General Surgery   Why:  For wound re-check   Contact information:   [place] [address] [phone]       Earnstine Regal, MD, [facility] Office: [phone]   Signed: Earnstine Regal 12/05/2015, 9:57 AM

## 2015-12-05 NOTE — Care Management Important Message (Signed)
Important Message  Patient Details  Name: [name] MRN: [id number] Date of Birth: Oct 14, 1985   Medicare Important Message Given:  Yes    Lillianah Swartzentruber, Leroy Sea 12/05/2015, 1:55 PM

## 2015-12-05 NOTE — Progress Notes (Signed)
[facility] Progress Note    Subjective: "The Dr. Rozell Searing there might be another gland they can't find..." Patient awake, cooperative and without C/O pain/discomfort. Wife at bedside.  Objective Filed Vitals:   12/04/15 1154 12/04/15 1635 12/04/15 2105 12/05/15 0401  BP: 161/82 156/92 187/90 178/99  Pulse: 77 80 80 81  Temp: 98.2 F (36.8 C) 98.3 F (36.8 C) 98.1 F (36.7 C) 98.6 F (37 C)  TempSrc: Oral Oral Oral Oral  Resp: $Remo'17 18 19 20  'gwoLS$ Height:      Weight:      SpO2: 100% 99% 100% 100%   Physical Exam General: Well nourished, pleasant, NAD Heart: S1,S2, RRR Lungs: BBS CTA Abdomen: Soft, nontender Skin: Warm, dry intact. Steri strips intact at base of neck and R forearm. Extremities: No LE edema Dialysis Access: LFA AVF + bruit   Additional Objective Labs: Basic Metabolic Panel:  Recent Labs Lab 12/04/15 0540 12/04/15 1832 12/05/15 0448  NA 139 133* 135  K 4.8 4.1 5.0  CL 97* 94* 95*  CO2 $Re'26 25 28  'Uem$ GLUCOSE 96 93 76  BUN 45* 17 31*  CREATININE 14.88* 8.44* 9.93*  CALCIUM 8.0* 9.3 9.3  PHOS 5.6* 4.6 6.4*   Liver Function Tests:  Recent Labs Lab 12/04/15 0540 12/04/15 1832 12/05/15 0448  ALBUMIN 3.8 3.7 3.4*   CBC:  Recent Labs Lab 12/03/15 1000 12/04/15 0724  WBC  --  7.8  HGB 11.6* 9.9*  HCT 34.0* 30.4*  MCV  --  79.8  PLT  --  118*   Blood Culture    Component Value Date/Time   SDES BLOOD RIGHT ANTECUBITAL 04/25/2014 0819   SDES BLOOD RIGHT HAND 04/25/2014 0819   SPECREQUEST BOTTLES DRAWN AEROBIC AND ANAEROBIC 5MLS 04/25/2014 0819   SPECREQUEST BOTTLES DRAWN AEROBIC AND ANAEROBIC 5MLS 04/25/2014 0819   CULT  04/25/2014 0819    NO GROWTH 5 DAYS Performed at [place]  04/25/2014 0819    NO GROWTH 5 DAYS Performed at [place] 05/01/2014 FINAL 04/25/2014 0819   REPTSTATUS 05/01/2014 FINAL 04/25/2014 0819    Studies/Results: Dg Chest 2 View  12/03/2015  CLINICAL  DATA:  Acute onset of generalized chest pain, status post recent total parathyroidectomy with autotransplant to right forearm. Initial encounter. EXAM: CHEST  2 VIEW COMPARISON:  Chest radiograph performed 04/26/2014 FINDINGS: The lungs are well-aerated and clear. There is no evidence of focal opacification, pleural effusion or pneumothorax. The heart is borderline enlarged. No acute osseous abnormalities are seen. IMPRESSION: Borderline cardiomegaly.  Lungs remain grossly clear. Electronically Signed   By: Garald Balding M.D.   On: 12/03/2015 20:11   Medications: . sodium chloride 10 mL/hr at 12/03/15 1019   . calcitRIOL  5 mcg Oral Q T,Th,Sa-HD  . calcium carbonate  1,500 mg of elemental calcium Oral TID WC  . lisinopril  20 mg Oral QHS    Dialysis Orders: Center: [place] on TTS . EDW 62.5 HD Bath 2.0 k, 2.0 ca Time 4hrs Heparin 5800. Access LFA AVF  Calcitriol 3.0 mcg PO /HD Mircera 50 mcg q 2 weeks on hd  Other op labs HGB 11.5 Last mircera 11/22/15 Last Ca 9.3 Phos 11.02 PTH 2939   Assessment/Plan 1. Severe hyperparathyroidism - s/p PTH'ect - only 3 glands found. Ca did drop but with pre-op PTH as high as 2900 would have expected more dramatic drop in serum Ca. Per Dr. Madie Reno DC today if cleared by  renal. Ca today 9.3, C Ca 9.78, Phos 6.4 Use 2.5 Ca bath tomorrow     with HD. Cont Oscal 3 tabs tid with meals. Decrease Calcitriol to 3 mcg PO q HD. Will check Phos/Ca q treatment x 3 as OP then weekly. Check PTH with HD tomorrow. Will ask patient not to discard sensipar as he may need this again . 2. ESRD - HD TTS For HD tomorrow  Hold heparin this week (then tight for 2 weeks, then back to usual.  3. Hypertension/volume - Patient has been hypertensive,on lisinopril 20 mg PO q hs. HD 12/04/15 on schedule. Pre wt 65.5 Net UF 3000 post wt 62.2 kg. Attempt to lower EDW to 62 kg-update orders when DC'd.  4. Anemia - Hb 11.6->9.9. On Mircera 73 Q2wks.Last ESA dose 06/07- Will  give ESA in HD tomorrow.  5. Nutrition - Albumin 3.4 soft  Change to Renal Diet if OK with primary. K+ 5.0, renal Vit add nepro.   Disposition: Spoke with Dr. Harlow Asa. He is fine with patient being [place] home today if clear from renal stand point. Will need Rx for Vicodin for pain control if dc'd today. He can pick up at our office.   Rita H. Brown NP-C 12/05/2015, 8:21 AM  [employer] [phone]  I have seen and examined this patient and agree with plan and assessment in the above note. Anticipate will still have sig PTH activity given only marginal fall in calcium and finding of only 3 glands. Medication plans as outlined above. I am fine with discharge to home.  Jamal Maes, MD [facility] [phone] Pager 12/05/2015, 11:16 AM

## 2015-12-06 DIAGNOSIS — D631 Anemia in chronic kidney disease: Secondary | ICD-10-CM | POA: Diagnosis not present

## 2015-12-06 DIAGNOSIS — N2581 Secondary hyperparathyroidism of renal origin: Secondary | ICD-10-CM | POA: Diagnosis not present

## 2015-12-06 DIAGNOSIS — D509 Iron deficiency anemia, unspecified: Secondary | ICD-10-CM | POA: Diagnosis not present

## 2015-12-06 DIAGNOSIS — N186 End stage renal disease: Secondary | ICD-10-CM | POA: Diagnosis not present

## 2015-12-08 DIAGNOSIS — N186 End stage renal disease: Secondary | ICD-10-CM | POA: Diagnosis not present

## 2015-12-08 DIAGNOSIS — N2581 Secondary hyperparathyroidism of renal origin: Secondary | ICD-10-CM | POA: Diagnosis not present

## 2015-12-08 DIAGNOSIS — D509 Iron deficiency anemia, unspecified: Secondary | ICD-10-CM | POA: Diagnosis not present

## 2015-12-08 DIAGNOSIS — D631 Anemia in chronic kidney disease: Secondary | ICD-10-CM | POA: Diagnosis not present

## 2015-12-11 DIAGNOSIS — D509 Iron deficiency anemia, unspecified: Secondary | ICD-10-CM | POA: Diagnosis not present

## 2015-12-11 DIAGNOSIS — N2581 Secondary hyperparathyroidism of renal origin: Secondary | ICD-10-CM | POA: Diagnosis not present

## 2015-12-11 DIAGNOSIS — N186 End stage renal disease: Secondary | ICD-10-CM | POA: Diagnosis not present

## 2015-12-11 DIAGNOSIS — D631 Anemia in chronic kidney disease: Secondary | ICD-10-CM | POA: Diagnosis not present

## 2015-12-13 DIAGNOSIS — N2581 Secondary hyperparathyroidism of renal origin: Secondary | ICD-10-CM | POA: Diagnosis not present

## 2015-12-13 DIAGNOSIS — D509 Iron deficiency anemia, unspecified: Secondary | ICD-10-CM | POA: Diagnosis not present

## 2015-12-13 DIAGNOSIS — D631 Anemia in chronic kidney disease: Secondary | ICD-10-CM | POA: Diagnosis not present

## 2015-12-13 DIAGNOSIS — N186 End stage renal disease: Secondary | ICD-10-CM | POA: Diagnosis not present

## 2015-12-14 DIAGNOSIS — Z992 Dependence on renal dialysis: Secondary | ICD-10-CM | POA: Diagnosis not present

## 2015-12-14 DIAGNOSIS — N186 End stage renal disease: Secondary | ICD-10-CM | POA: Diagnosis not present

## 2015-12-14 DIAGNOSIS — T8612 Kidney transplant failure: Secondary | ICD-10-CM | POA: Diagnosis not present

## 2015-12-15 DIAGNOSIS — N186 End stage renal disease: Secondary | ICD-10-CM | POA: Diagnosis not present

## 2015-12-15 DIAGNOSIS — D509 Iron deficiency anemia, unspecified: Secondary | ICD-10-CM | POA: Diagnosis not present

## 2015-12-15 DIAGNOSIS — D631 Anemia in chronic kidney disease: Secondary | ICD-10-CM | POA: Diagnosis not present

## 2015-12-15 DIAGNOSIS — N2581 Secondary hyperparathyroidism of renal origin: Secondary | ICD-10-CM | POA: Diagnosis not present

## 2015-12-18 DIAGNOSIS — D509 Iron deficiency anemia, unspecified: Secondary | ICD-10-CM | POA: Diagnosis not present

## 2015-12-18 DIAGNOSIS — N186 End stage renal disease: Secondary | ICD-10-CM | POA: Diagnosis not present

## 2015-12-18 DIAGNOSIS — D631 Anemia in chronic kidney disease: Secondary | ICD-10-CM | POA: Diagnosis not present

## 2015-12-18 DIAGNOSIS — N2581 Secondary hyperparathyroidism of renal origin: Secondary | ICD-10-CM | POA: Diagnosis not present

## 2015-12-20 DIAGNOSIS — N2581 Secondary hyperparathyroidism of renal origin: Secondary | ICD-10-CM | POA: Diagnosis not present

## 2015-12-20 DIAGNOSIS — D631 Anemia in chronic kidney disease: Secondary | ICD-10-CM | POA: Diagnosis not present

## 2015-12-20 DIAGNOSIS — D509 Iron deficiency anemia, unspecified: Secondary | ICD-10-CM | POA: Diagnosis not present

## 2015-12-20 DIAGNOSIS — N186 End stage renal disease: Secondary | ICD-10-CM | POA: Diagnosis not present

## 2015-12-22 DIAGNOSIS — N186 End stage renal disease: Secondary | ICD-10-CM | POA: Diagnosis not present

## 2015-12-22 DIAGNOSIS — D631 Anemia in chronic kidney disease: Secondary | ICD-10-CM | POA: Diagnosis not present

## 2015-12-22 DIAGNOSIS — N2581 Secondary hyperparathyroidism of renal origin: Secondary | ICD-10-CM | POA: Diagnosis not present

## 2015-12-22 DIAGNOSIS — D509 Iron deficiency anemia, unspecified: Secondary | ICD-10-CM | POA: Diagnosis not present

## 2015-12-25 DIAGNOSIS — N2581 Secondary hyperparathyroidism of renal origin: Secondary | ICD-10-CM | POA: Diagnosis not present

## 2015-12-25 DIAGNOSIS — D631 Anemia in chronic kidney disease: Secondary | ICD-10-CM | POA: Diagnosis not present

## 2015-12-25 DIAGNOSIS — D509 Iron deficiency anemia, unspecified: Secondary | ICD-10-CM | POA: Diagnosis not present

## 2015-12-25 DIAGNOSIS — N186 End stage renal disease: Secondary | ICD-10-CM | POA: Diagnosis not present

## 2015-12-27 DIAGNOSIS — D509 Iron deficiency anemia, unspecified: Secondary | ICD-10-CM | POA: Diagnosis not present

## 2015-12-27 DIAGNOSIS — N2581 Secondary hyperparathyroidism of renal origin: Secondary | ICD-10-CM | POA: Diagnosis not present

## 2015-12-27 DIAGNOSIS — D631 Anemia in chronic kidney disease: Secondary | ICD-10-CM | POA: Diagnosis not present

## 2015-12-27 DIAGNOSIS — N186 End stage renal disease: Secondary | ICD-10-CM | POA: Diagnosis not present

## 2015-12-29 DIAGNOSIS — D509 Iron deficiency anemia, unspecified: Secondary | ICD-10-CM | POA: Diagnosis not present

## 2015-12-29 DIAGNOSIS — N2581 Secondary hyperparathyroidism of renal origin: Secondary | ICD-10-CM | POA: Diagnosis not present

## 2015-12-29 DIAGNOSIS — D631 Anemia in chronic kidney disease: Secondary | ICD-10-CM | POA: Diagnosis not present

## 2015-12-29 DIAGNOSIS — N186 End stage renal disease: Secondary | ICD-10-CM | POA: Diagnosis not present

## 2016-01-01 DIAGNOSIS — N186 End stage renal disease: Secondary | ICD-10-CM | POA: Diagnosis not present

## 2016-01-01 DIAGNOSIS — D509 Iron deficiency anemia, unspecified: Secondary | ICD-10-CM | POA: Diagnosis not present

## 2016-01-01 DIAGNOSIS — D631 Anemia in chronic kidney disease: Secondary | ICD-10-CM | POA: Diagnosis not present

## 2016-01-01 DIAGNOSIS — N2581 Secondary hyperparathyroidism of renal origin: Secondary | ICD-10-CM | POA: Diagnosis not present

## 2016-01-03 DIAGNOSIS — N2581 Secondary hyperparathyroidism of renal origin: Secondary | ICD-10-CM | POA: Diagnosis not present

## 2016-01-03 DIAGNOSIS — D631 Anemia in chronic kidney disease: Secondary | ICD-10-CM | POA: Diagnosis not present

## 2016-01-03 DIAGNOSIS — N186 End stage renal disease: Secondary | ICD-10-CM | POA: Diagnosis not present

## 2016-01-03 DIAGNOSIS — D509 Iron deficiency anemia, unspecified: Secondary | ICD-10-CM | POA: Diagnosis not present

## 2016-01-05 DIAGNOSIS — N186 End stage renal disease: Secondary | ICD-10-CM | POA: Diagnosis not present

## 2016-01-05 DIAGNOSIS — D631 Anemia in chronic kidney disease: Secondary | ICD-10-CM | POA: Diagnosis not present

## 2016-01-05 DIAGNOSIS — D509 Iron deficiency anemia, unspecified: Secondary | ICD-10-CM | POA: Diagnosis not present

## 2016-01-05 DIAGNOSIS — N2581 Secondary hyperparathyroidism of renal origin: Secondary | ICD-10-CM | POA: Diagnosis not present

## 2016-01-08 DIAGNOSIS — D509 Iron deficiency anemia, unspecified: Secondary | ICD-10-CM | POA: Diagnosis not present

## 2016-01-08 DIAGNOSIS — N186 End stage renal disease: Secondary | ICD-10-CM | POA: Diagnosis not present

## 2016-01-08 DIAGNOSIS — D631 Anemia in chronic kidney disease: Secondary | ICD-10-CM | POA: Diagnosis not present

## 2016-01-08 DIAGNOSIS — N2581 Secondary hyperparathyroidism of renal origin: Secondary | ICD-10-CM | POA: Diagnosis not present

## 2016-01-10 DIAGNOSIS — N186 End stage renal disease: Secondary | ICD-10-CM | POA: Diagnosis not present

## 2016-01-10 DIAGNOSIS — N2581 Secondary hyperparathyroidism of renal origin: Secondary | ICD-10-CM | POA: Diagnosis not present

## 2016-01-10 DIAGNOSIS — D631 Anemia in chronic kidney disease: Secondary | ICD-10-CM | POA: Diagnosis not present

## 2016-01-10 DIAGNOSIS — D509 Iron deficiency anemia, unspecified: Secondary | ICD-10-CM | POA: Diagnosis not present

## 2016-01-12 DIAGNOSIS — D509 Iron deficiency anemia, unspecified: Secondary | ICD-10-CM | POA: Diagnosis not present

## 2016-01-12 DIAGNOSIS — N2581 Secondary hyperparathyroidism of renal origin: Secondary | ICD-10-CM | POA: Diagnosis not present

## 2016-01-12 DIAGNOSIS — N186 End stage renal disease: Secondary | ICD-10-CM | POA: Diagnosis not present

## 2016-01-12 DIAGNOSIS — D631 Anemia in chronic kidney disease: Secondary | ICD-10-CM | POA: Diagnosis not present

## 2016-01-14 DIAGNOSIS — N186 End stage renal disease: Secondary | ICD-10-CM | POA: Diagnosis not present

## 2016-01-14 DIAGNOSIS — T8612 Kidney transplant failure: Secondary | ICD-10-CM | POA: Diagnosis not present

## 2016-01-14 DIAGNOSIS — Z992 Dependence on renal dialysis: Secondary | ICD-10-CM | POA: Diagnosis not present

## 2016-01-15 DIAGNOSIS — N186 End stage renal disease: Secondary | ICD-10-CM | POA: Diagnosis not present

## 2016-01-15 DIAGNOSIS — N2581 Secondary hyperparathyroidism of renal origin: Secondary | ICD-10-CM | POA: Diagnosis not present

## 2016-01-15 DIAGNOSIS — D509 Iron deficiency anemia, unspecified: Secondary | ICD-10-CM | POA: Diagnosis not present

## 2016-01-15 DIAGNOSIS — D631 Anemia in chronic kidney disease: Secondary | ICD-10-CM | POA: Diagnosis not present

## 2016-01-17 DIAGNOSIS — N186 End stage renal disease: Secondary | ICD-10-CM | POA: Diagnosis not present

## 2016-01-17 DIAGNOSIS — N2581 Secondary hyperparathyroidism of renal origin: Secondary | ICD-10-CM | POA: Diagnosis not present

## 2016-01-17 DIAGNOSIS — D631 Anemia in chronic kidney disease: Secondary | ICD-10-CM | POA: Diagnosis not present

## 2016-01-17 DIAGNOSIS — D509 Iron deficiency anemia, unspecified: Secondary | ICD-10-CM | POA: Diagnosis not present

## 2016-01-19 DIAGNOSIS — N186 End stage renal disease: Secondary | ICD-10-CM | POA: Diagnosis not present

## 2016-01-19 DIAGNOSIS — D509 Iron deficiency anemia, unspecified: Secondary | ICD-10-CM | POA: Diagnosis not present

## 2016-01-19 DIAGNOSIS — N2581 Secondary hyperparathyroidism of renal origin: Secondary | ICD-10-CM | POA: Diagnosis not present

## 2016-01-19 DIAGNOSIS — D631 Anemia in chronic kidney disease: Secondary | ICD-10-CM | POA: Diagnosis not present

## 2016-01-22 DIAGNOSIS — N2581 Secondary hyperparathyroidism of renal origin: Secondary | ICD-10-CM | POA: Diagnosis not present

## 2016-01-22 DIAGNOSIS — D631 Anemia in chronic kidney disease: Secondary | ICD-10-CM | POA: Diagnosis not present

## 2016-01-22 DIAGNOSIS — N186 End stage renal disease: Secondary | ICD-10-CM | POA: Diagnosis not present

## 2016-01-22 DIAGNOSIS — D509 Iron deficiency anemia, unspecified: Secondary | ICD-10-CM | POA: Diagnosis not present

## 2016-01-24 DIAGNOSIS — N2581 Secondary hyperparathyroidism of renal origin: Secondary | ICD-10-CM | POA: Diagnosis not present

## 2016-01-24 DIAGNOSIS — D509 Iron deficiency anemia, unspecified: Secondary | ICD-10-CM | POA: Diagnosis not present

## 2016-01-24 DIAGNOSIS — N186 End stage renal disease: Secondary | ICD-10-CM | POA: Diagnosis not present

## 2016-01-24 DIAGNOSIS — D631 Anemia in chronic kidney disease: Secondary | ICD-10-CM | POA: Diagnosis not present

## 2016-01-26 DIAGNOSIS — N2581 Secondary hyperparathyroidism of renal origin: Secondary | ICD-10-CM | POA: Diagnosis not present

## 2016-01-26 DIAGNOSIS — D509 Iron deficiency anemia, unspecified: Secondary | ICD-10-CM | POA: Diagnosis not present

## 2016-01-26 DIAGNOSIS — N186 End stage renal disease: Secondary | ICD-10-CM | POA: Diagnosis not present

## 2016-01-26 DIAGNOSIS — D631 Anemia in chronic kidney disease: Secondary | ICD-10-CM | POA: Diagnosis not present

## 2016-01-29 DIAGNOSIS — N186 End stage renal disease: Secondary | ICD-10-CM | POA: Diagnosis not present

## 2016-01-29 DIAGNOSIS — D631 Anemia in chronic kidney disease: Secondary | ICD-10-CM | POA: Diagnosis not present

## 2016-01-29 DIAGNOSIS — D509 Iron deficiency anemia, unspecified: Secondary | ICD-10-CM | POA: Diagnosis not present

## 2016-01-29 DIAGNOSIS — N2581 Secondary hyperparathyroidism of renal origin: Secondary | ICD-10-CM | POA: Diagnosis not present

## 2016-01-31 DIAGNOSIS — N2581 Secondary hyperparathyroidism of renal origin: Secondary | ICD-10-CM | POA: Diagnosis not present

## 2016-01-31 DIAGNOSIS — N186 End stage renal disease: Secondary | ICD-10-CM | POA: Diagnosis not present

## 2016-01-31 DIAGNOSIS — D509 Iron deficiency anemia, unspecified: Secondary | ICD-10-CM | POA: Diagnosis not present

## 2016-01-31 DIAGNOSIS — D631 Anemia in chronic kidney disease: Secondary | ICD-10-CM | POA: Diagnosis not present

## 2016-02-02 DIAGNOSIS — N186 End stage renal disease: Secondary | ICD-10-CM | POA: Diagnosis not present

## 2016-02-02 DIAGNOSIS — D509 Iron deficiency anemia, unspecified: Secondary | ICD-10-CM | POA: Diagnosis not present

## 2016-02-02 DIAGNOSIS — D631 Anemia in chronic kidney disease: Secondary | ICD-10-CM | POA: Diagnosis not present

## 2016-02-02 DIAGNOSIS — N2581 Secondary hyperparathyroidism of renal origin: Secondary | ICD-10-CM | POA: Diagnosis not present

## 2016-02-05 DIAGNOSIS — D509 Iron deficiency anemia, unspecified: Secondary | ICD-10-CM | POA: Diagnosis not present

## 2016-02-05 DIAGNOSIS — N186 End stage renal disease: Secondary | ICD-10-CM | POA: Diagnosis not present

## 2016-02-05 DIAGNOSIS — D631 Anemia in chronic kidney disease: Secondary | ICD-10-CM | POA: Diagnosis not present

## 2016-02-05 DIAGNOSIS — N2581 Secondary hyperparathyroidism of renal origin: Secondary | ICD-10-CM | POA: Diagnosis not present

## 2016-02-07 DIAGNOSIS — D509 Iron deficiency anemia, unspecified: Secondary | ICD-10-CM | POA: Diagnosis not present

## 2016-02-07 DIAGNOSIS — D631 Anemia in chronic kidney disease: Secondary | ICD-10-CM | POA: Diagnosis not present

## 2016-02-07 DIAGNOSIS — N186 End stage renal disease: Secondary | ICD-10-CM | POA: Diagnosis not present

## 2016-02-07 DIAGNOSIS — N2581 Secondary hyperparathyroidism of renal origin: Secondary | ICD-10-CM | POA: Diagnosis not present

## 2016-02-09 DIAGNOSIS — D631 Anemia in chronic kidney disease: Secondary | ICD-10-CM | POA: Diagnosis not present

## 2016-02-09 DIAGNOSIS — N2581 Secondary hyperparathyroidism of renal origin: Secondary | ICD-10-CM | POA: Diagnosis not present

## 2016-02-09 DIAGNOSIS — D509 Iron deficiency anemia, unspecified: Secondary | ICD-10-CM | POA: Diagnosis not present

## 2016-02-09 DIAGNOSIS — N186 End stage renal disease: Secondary | ICD-10-CM | POA: Diagnosis not present

## 2016-02-12 DIAGNOSIS — D509 Iron deficiency anemia, unspecified: Secondary | ICD-10-CM | POA: Diagnosis not present

## 2016-02-12 DIAGNOSIS — N2581 Secondary hyperparathyroidism of renal origin: Secondary | ICD-10-CM | POA: Diagnosis not present

## 2016-02-12 DIAGNOSIS — D631 Anemia in chronic kidney disease: Secondary | ICD-10-CM | POA: Diagnosis not present

## 2016-02-12 DIAGNOSIS — N186 End stage renal disease: Secondary | ICD-10-CM | POA: Diagnosis not present

## 2016-02-13 ENCOUNTER — Encounter (HOSPITAL_COMMUNITY): Payer: Self-pay

## 2016-02-14 DIAGNOSIS — D509 Iron deficiency anemia, unspecified: Secondary | ICD-10-CM | POA: Diagnosis not present

## 2016-02-14 DIAGNOSIS — N186 End stage renal disease: Secondary | ICD-10-CM | POA: Diagnosis not present

## 2016-02-14 DIAGNOSIS — N2581 Secondary hyperparathyroidism of renal origin: Secondary | ICD-10-CM | POA: Diagnosis not present

## 2016-02-14 DIAGNOSIS — D631 Anemia in chronic kidney disease: Secondary | ICD-10-CM | POA: Diagnosis not present

## 2016-02-14 DIAGNOSIS — Z992 Dependence on renal dialysis: Secondary | ICD-10-CM | POA: Diagnosis not present

## 2016-02-14 DIAGNOSIS — T8612 Kidney transplant failure: Secondary | ICD-10-CM | POA: Diagnosis not present

## 2016-02-16 DIAGNOSIS — N186 End stage renal disease: Secondary | ICD-10-CM | POA: Diagnosis not present

## 2016-02-16 DIAGNOSIS — N2581 Secondary hyperparathyroidism of renal origin: Secondary | ICD-10-CM | POA: Diagnosis not present

## 2016-02-16 DIAGNOSIS — D631 Anemia in chronic kidney disease: Secondary | ICD-10-CM | POA: Diagnosis not present

## 2016-02-16 DIAGNOSIS — D509 Iron deficiency anemia, unspecified: Secondary | ICD-10-CM | POA: Diagnosis not present

## 2016-02-16 DIAGNOSIS — Z23 Encounter for immunization: Secondary | ICD-10-CM | POA: Diagnosis not present

## 2016-02-19 DIAGNOSIS — N186 End stage renal disease: Secondary | ICD-10-CM | POA: Diagnosis not present

## 2016-02-19 DIAGNOSIS — D509 Iron deficiency anemia, unspecified: Secondary | ICD-10-CM | POA: Diagnosis not present

## 2016-02-19 DIAGNOSIS — N2581 Secondary hyperparathyroidism of renal origin: Secondary | ICD-10-CM | POA: Diagnosis not present

## 2016-02-19 DIAGNOSIS — Z23 Encounter for immunization: Secondary | ICD-10-CM | POA: Diagnosis not present

## 2016-02-19 DIAGNOSIS — D631 Anemia in chronic kidney disease: Secondary | ICD-10-CM | POA: Diagnosis not present

## 2016-02-21 DIAGNOSIS — Z23 Encounter for immunization: Secondary | ICD-10-CM | POA: Diagnosis not present

## 2016-02-21 DIAGNOSIS — D631 Anemia in chronic kidney disease: Secondary | ICD-10-CM | POA: Diagnosis not present

## 2016-02-21 DIAGNOSIS — D509 Iron deficiency anemia, unspecified: Secondary | ICD-10-CM | POA: Diagnosis not present

## 2016-02-21 DIAGNOSIS — N186 End stage renal disease: Secondary | ICD-10-CM | POA: Diagnosis not present

## 2016-02-21 DIAGNOSIS — N2581 Secondary hyperparathyroidism of renal origin: Secondary | ICD-10-CM | POA: Diagnosis not present

## 2016-02-23 DIAGNOSIS — N2581 Secondary hyperparathyroidism of renal origin: Secondary | ICD-10-CM | POA: Diagnosis not present

## 2016-02-23 DIAGNOSIS — Z23 Encounter for immunization: Secondary | ICD-10-CM | POA: Diagnosis not present

## 2016-02-23 DIAGNOSIS — N186 End stage renal disease: Secondary | ICD-10-CM | POA: Diagnosis not present

## 2016-02-23 DIAGNOSIS — D509 Iron deficiency anemia, unspecified: Secondary | ICD-10-CM | POA: Diagnosis not present

## 2016-02-23 DIAGNOSIS — D631 Anemia in chronic kidney disease: Secondary | ICD-10-CM | POA: Diagnosis not present

## 2016-02-26 DIAGNOSIS — N2581 Secondary hyperparathyroidism of renal origin: Secondary | ICD-10-CM | POA: Diagnosis not present

## 2016-02-26 DIAGNOSIS — N186 End stage renal disease: Secondary | ICD-10-CM | POA: Diagnosis not present

## 2016-02-26 DIAGNOSIS — Z23 Encounter for immunization: Secondary | ICD-10-CM | POA: Diagnosis not present

## 2016-02-26 DIAGNOSIS — D631 Anemia in chronic kidney disease: Secondary | ICD-10-CM | POA: Diagnosis not present

## 2016-02-26 DIAGNOSIS — D509 Iron deficiency anemia, unspecified: Secondary | ICD-10-CM | POA: Diagnosis not present

## 2016-02-28 DIAGNOSIS — D631 Anemia in chronic kidney disease: Secondary | ICD-10-CM | POA: Diagnosis not present

## 2016-02-28 DIAGNOSIS — N2581 Secondary hyperparathyroidism of renal origin: Secondary | ICD-10-CM | POA: Diagnosis not present

## 2016-02-28 DIAGNOSIS — Z23 Encounter for immunization: Secondary | ICD-10-CM | POA: Diagnosis not present

## 2016-02-28 DIAGNOSIS — N186 End stage renal disease: Secondary | ICD-10-CM | POA: Diagnosis not present

## 2016-02-28 DIAGNOSIS — D509 Iron deficiency anemia, unspecified: Secondary | ICD-10-CM | POA: Diagnosis not present

## 2016-03-01 DIAGNOSIS — N2581 Secondary hyperparathyroidism of renal origin: Secondary | ICD-10-CM | POA: Diagnosis not present

## 2016-03-01 DIAGNOSIS — D631 Anemia in chronic kidney disease: Secondary | ICD-10-CM | POA: Diagnosis not present

## 2016-03-01 DIAGNOSIS — D509 Iron deficiency anemia, unspecified: Secondary | ICD-10-CM | POA: Diagnosis not present

## 2016-03-01 DIAGNOSIS — N186 End stage renal disease: Secondary | ICD-10-CM | POA: Diagnosis not present

## 2016-03-01 DIAGNOSIS — Z23 Encounter for immunization: Secondary | ICD-10-CM | POA: Diagnosis not present

## 2016-03-04 DIAGNOSIS — D631 Anemia in chronic kidney disease: Secondary | ICD-10-CM | POA: Diagnosis not present

## 2016-03-04 DIAGNOSIS — N2581 Secondary hyperparathyroidism of renal origin: Secondary | ICD-10-CM | POA: Diagnosis not present

## 2016-03-04 DIAGNOSIS — D509 Iron deficiency anemia, unspecified: Secondary | ICD-10-CM | POA: Diagnosis not present

## 2016-03-04 DIAGNOSIS — Z23 Encounter for immunization: Secondary | ICD-10-CM | POA: Diagnosis not present

## 2016-03-04 DIAGNOSIS — N186 End stage renal disease: Secondary | ICD-10-CM | POA: Diagnosis not present

## 2016-03-06 DIAGNOSIS — N186 End stage renal disease: Secondary | ICD-10-CM | POA: Diagnosis not present

## 2016-03-06 DIAGNOSIS — N2581 Secondary hyperparathyroidism of renal origin: Secondary | ICD-10-CM | POA: Diagnosis not present

## 2016-03-06 DIAGNOSIS — D631 Anemia in chronic kidney disease: Secondary | ICD-10-CM | POA: Diagnosis not present

## 2016-03-06 DIAGNOSIS — Z23 Encounter for immunization: Secondary | ICD-10-CM | POA: Diagnosis not present

## 2016-03-06 DIAGNOSIS — D509 Iron deficiency anemia, unspecified: Secondary | ICD-10-CM | POA: Diagnosis not present

## 2016-03-08 DIAGNOSIS — D509 Iron deficiency anemia, unspecified: Secondary | ICD-10-CM | POA: Diagnosis not present

## 2016-03-08 DIAGNOSIS — D631 Anemia in chronic kidney disease: Secondary | ICD-10-CM | POA: Diagnosis not present

## 2016-03-08 DIAGNOSIS — N2581 Secondary hyperparathyroidism of renal origin: Secondary | ICD-10-CM | POA: Diagnosis not present

## 2016-03-08 DIAGNOSIS — N186 End stage renal disease: Secondary | ICD-10-CM | POA: Diagnosis not present

## 2016-03-08 DIAGNOSIS — Z23 Encounter for immunization: Secondary | ICD-10-CM | POA: Diagnosis not present

## 2016-03-11 DIAGNOSIS — N2581 Secondary hyperparathyroidism of renal origin: Secondary | ICD-10-CM | POA: Diagnosis not present

## 2016-03-11 DIAGNOSIS — Z23 Encounter for immunization: Secondary | ICD-10-CM | POA: Diagnosis not present

## 2016-03-11 DIAGNOSIS — D631 Anemia in chronic kidney disease: Secondary | ICD-10-CM | POA: Diagnosis not present

## 2016-03-11 DIAGNOSIS — N186 End stage renal disease: Secondary | ICD-10-CM | POA: Diagnosis not present

## 2016-03-11 DIAGNOSIS — D509 Iron deficiency anemia, unspecified: Secondary | ICD-10-CM | POA: Diagnosis not present

## 2016-03-12 ENCOUNTER — Other Ambulatory Visit (HOSPITAL_COMMUNITY): Payer: Self-pay | Admitting: Surgery

## 2016-03-12 DIAGNOSIS — N2581 Secondary hyperparathyroidism of renal origin: Secondary | ICD-10-CM

## 2016-03-13 DIAGNOSIS — D631 Anemia in chronic kidney disease: Secondary | ICD-10-CM | POA: Diagnosis not present

## 2016-03-13 DIAGNOSIS — N186 End stage renal disease: Secondary | ICD-10-CM | POA: Diagnosis not present

## 2016-03-13 DIAGNOSIS — Z23 Encounter for immunization: Secondary | ICD-10-CM | POA: Diagnosis not present

## 2016-03-13 DIAGNOSIS — D509 Iron deficiency anemia, unspecified: Secondary | ICD-10-CM | POA: Diagnosis not present

## 2016-03-13 DIAGNOSIS — N2581 Secondary hyperparathyroidism of renal origin: Secondary | ICD-10-CM | POA: Diagnosis not present

## 2016-03-15 DIAGNOSIS — N2581 Secondary hyperparathyroidism of renal origin: Secondary | ICD-10-CM | POA: Diagnosis not present

## 2016-03-15 DIAGNOSIS — Z992 Dependence on renal dialysis: Secondary | ICD-10-CM | POA: Diagnosis not present

## 2016-03-15 DIAGNOSIS — N186 End stage renal disease: Secondary | ICD-10-CM | POA: Diagnosis not present

## 2016-03-15 DIAGNOSIS — D509 Iron deficiency anemia, unspecified: Secondary | ICD-10-CM | POA: Diagnosis not present

## 2016-03-15 DIAGNOSIS — T8612 Kidney transplant failure: Secondary | ICD-10-CM | POA: Diagnosis not present

## 2016-03-15 DIAGNOSIS — Z23 Encounter for immunization: Secondary | ICD-10-CM | POA: Diagnosis not present

## 2016-03-15 DIAGNOSIS — D631 Anemia in chronic kidney disease: Secondary | ICD-10-CM | POA: Diagnosis not present

## 2016-03-18 DIAGNOSIS — D509 Iron deficiency anemia, unspecified: Secondary | ICD-10-CM | POA: Diagnosis not present

## 2016-03-18 DIAGNOSIS — N186 End stage renal disease: Secondary | ICD-10-CM | POA: Diagnosis not present

## 2016-03-18 DIAGNOSIS — D631 Anemia in chronic kidney disease: Secondary | ICD-10-CM | POA: Diagnosis not present

## 2016-03-18 DIAGNOSIS — N2581 Secondary hyperparathyroidism of renal origin: Secondary | ICD-10-CM | POA: Diagnosis not present

## 2016-03-20 DIAGNOSIS — D509 Iron deficiency anemia, unspecified: Secondary | ICD-10-CM | POA: Diagnosis not present

## 2016-03-20 DIAGNOSIS — N2581 Secondary hyperparathyroidism of renal origin: Secondary | ICD-10-CM | POA: Diagnosis not present

## 2016-03-20 DIAGNOSIS — D631 Anemia in chronic kidney disease: Secondary | ICD-10-CM | POA: Diagnosis not present

## 2016-03-20 DIAGNOSIS — N186 End stage renal disease: Secondary | ICD-10-CM | POA: Diagnosis not present

## 2016-03-22 DIAGNOSIS — N2581 Secondary hyperparathyroidism of renal origin: Secondary | ICD-10-CM | POA: Diagnosis not present

## 2016-03-22 DIAGNOSIS — D509 Iron deficiency anemia, unspecified: Secondary | ICD-10-CM | POA: Diagnosis not present

## 2016-03-22 DIAGNOSIS — D631 Anemia in chronic kidney disease: Secondary | ICD-10-CM | POA: Diagnosis not present

## 2016-03-22 DIAGNOSIS — N186 End stage renal disease: Secondary | ICD-10-CM | POA: Diagnosis not present

## 2016-03-25 DIAGNOSIS — N186 End stage renal disease: Secondary | ICD-10-CM | POA: Diagnosis not present

## 2016-03-25 DIAGNOSIS — D509 Iron deficiency anemia, unspecified: Secondary | ICD-10-CM | POA: Diagnosis not present

## 2016-03-25 DIAGNOSIS — D631 Anemia in chronic kidney disease: Secondary | ICD-10-CM | POA: Diagnosis not present

## 2016-03-25 DIAGNOSIS — N2581 Secondary hyperparathyroidism of renal origin: Secondary | ICD-10-CM | POA: Diagnosis not present

## 2016-03-27 ENCOUNTER — Encounter (HOSPITAL_COMMUNITY): Payer: Medicare Other

## 2016-03-27 ENCOUNTER — Encounter (HOSPITAL_COMMUNITY): Admission: RE | Admit: 2016-03-27 | Payer: Medicare Other | Source: Ambulatory Visit

## 2016-03-27 DIAGNOSIS — D631 Anemia in chronic kidney disease: Secondary | ICD-10-CM | POA: Diagnosis not present

## 2016-03-27 DIAGNOSIS — D509 Iron deficiency anemia, unspecified: Secondary | ICD-10-CM | POA: Diagnosis not present

## 2016-03-27 DIAGNOSIS — N2581 Secondary hyperparathyroidism of renal origin: Secondary | ICD-10-CM | POA: Diagnosis not present

## 2016-03-27 DIAGNOSIS — N186 End stage renal disease: Secondary | ICD-10-CM | POA: Diagnosis not present

## 2016-03-29 DIAGNOSIS — N186 End stage renal disease: Secondary | ICD-10-CM | POA: Diagnosis not present

## 2016-03-29 DIAGNOSIS — D509 Iron deficiency anemia, unspecified: Secondary | ICD-10-CM | POA: Diagnosis not present

## 2016-03-29 DIAGNOSIS — D631 Anemia in chronic kidney disease: Secondary | ICD-10-CM | POA: Diagnosis not present

## 2016-03-29 DIAGNOSIS — N2581 Secondary hyperparathyroidism of renal origin: Secondary | ICD-10-CM | POA: Diagnosis not present

## 2016-04-01 DIAGNOSIS — D509 Iron deficiency anemia, unspecified: Secondary | ICD-10-CM | POA: Diagnosis not present

## 2016-04-01 DIAGNOSIS — D631 Anemia in chronic kidney disease: Secondary | ICD-10-CM | POA: Diagnosis not present

## 2016-04-01 DIAGNOSIS — N2581 Secondary hyperparathyroidism of renal origin: Secondary | ICD-10-CM | POA: Diagnosis not present

## 2016-04-01 DIAGNOSIS — N186 End stage renal disease: Secondary | ICD-10-CM | POA: Diagnosis not present

## 2016-04-03 DIAGNOSIS — D631 Anemia in chronic kidney disease: Secondary | ICD-10-CM | POA: Diagnosis not present

## 2016-04-03 DIAGNOSIS — N2581 Secondary hyperparathyroidism of renal origin: Secondary | ICD-10-CM | POA: Diagnosis not present

## 2016-04-03 DIAGNOSIS — D509 Iron deficiency anemia, unspecified: Secondary | ICD-10-CM | POA: Diagnosis not present

## 2016-04-03 DIAGNOSIS — N186 End stage renal disease: Secondary | ICD-10-CM | POA: Diagnosis not present

## 2016-04-05 DIAGNOSIS — D631 Anemia in chronic kidney disease: Secondary | ICD-10-CM | POA: Diagnosis not present

## 2016-04-05 DIAGNOSIS — D509 Iron deficiency anemia, unspecified: Secondary | ICD-10-CM | POA: Diagnosis not present

## 2016-04-05 DIAGNOSIS — N186 End stage renal disease: Secondary | ICD-10-CM | POA: Diagnosis not present

## 2016-04-05 DIAGNOSIS — N2581 Secondary hyperparathyroidism of renal origin: Secondary | ICD-10-CM | POA: Diagnosis not present

## 2016-04-08 DIAGNOSIS — D631 Anemia in chronic kidney disease: Secondary | ICD-10-CM | POA: Diagnosis not present

## 2016-04-08 DIAGNOSIS — D509 Iron deficiency anemia, unspecified: Secondary | ICD-10-CM | POA: Diagnosis not present

## 2016-04-08 DIAGNOSIS — N186 End stage renal disease: Secondary | ICD-10-CM | POA: Diagnosis not present

## 2016-04-08 DIAGNOSIS — N2581 Secondary hyperparathyroidism of renal origin: Secondary | ICD-10-CM | POA: Diagnosis not present

## 2016-04-10 DIAGNOSIS — D631 Anemia in chronic kidney disease: Secondary | ICD-10-CM | POA: Diagnosis not present

## 2016-04-10 DIAGNOSIS — N186 End stage renal disease: Secondary | ICD-10-CM | POA: Diagnosis not present

## 2016-04-10 DIAGNOSIS — N2581 Secondary hyperparathyroidism of renal origin: Secondary | ICD-10-CM | POA: Diagnosis not present

## 2016-04-10 DIAGNOSIS — D509 Iron deficiency anemia, unspecified: Secondary | ICD-10-CM | POA: Diagnosis not present

## 2016-04-12 DIAGNOSIS — N2581 Secondary hyperparathyroidism of renal origin: Secondary | ICD-10-CM | POA: Diagnosis not present

## 2016-04-12 DIAGNOSIS — D631 Anemia in chronic kidney disease: Secondary | ICD-10-CM | POA: Diagnosis not present

## 2016-04-12 DIAGNOSIS — N186 End stage renal disease: Secondary | ICD-10-CM | POA: Diagnosis not present

## 2016-04-12 DIAGNOSIS — D509 Iron deficiency anemia, unspecified: Secondary | ICD-10-CM | POA: Diagnosis not present

## 2016-04-15 DIAGNOSIS — D509 Iron deficiency anemia, unspecified: Secondary | ICD-10-CM | POA: Diagnosis not present

## 2016-04-15 DIAGNOSIS — T8612 Kidney transplant failure: Secondary | ICD-10-CM | POA: Diagnosis not present

## 2016-04-15 DIAGNOSIS — N186 End stage renal disease: Secondary | ICD-10-CM | POA: Diagnosis not present

## 2016-04-15 DIAGNOSIS — N2581 Secondary hyperparathyroidism of renal origin: Secondary | ICD-10-CM | POA: Diagnosis not present

## 2016-04-15 DIAGNOSIS — D631 Anemia in chronic kidney disease: Secondary | ICD-10-CM | POA: Diagnosis not present

## 2016-04-15 DIAGNOSIS — Z992 Dependence on renal dialysis: Secondary | ICD-10-CM | POA: Diagnosis not present

## 2016-04-17 DIAGNOSIS — N186 End stage renal disease: Secondary | ICD-10-CM | POA: Diagnosis not present

## 2016-04-17 DIAGNOSIS — D631 Anemia in chronic kidney disease: Secondary | ICD-10-CM | POA: Diagnosis not present

## 2016-04-17 DIAGNOSIS — D509 Iron deficiency anemia, unspecified: Secondary | ICD-10-CM | POA: Diagnosis not present

## 2016-04-17 DIAGNOSIS — N2581 Secondary hyperparathyroidism of renal origin: Secondary | ICD-10-CM | POA: Diagnosis not present

## 2016-04-19 DIAGNOSIS — D631 Anemia in chronic kidney disease: Secondary | ICD-10-CM | POA: Diagnosis not present

## 2016-04-19 DIAGNOSIS — N186 End stage renal disease: Secondary | ICD-10-CM | POA: Diagnosis not present

## 2016-04-19 DIAGNOSIS — N2581 Secondary hyperparathyroidism of renal origin: Secondary | ICD-10-CM | POA: Diagnosis not present

## 2016-04-19 DIAGNOSIS — D509 Iron deficiency anemia, unspecified: Secondary | ICD-10-CM | POA: Diagnosis not present

## 2016-04-22 DIAGNOSIS — D509 Iron deficiency anemia, unspecified: Secondary | ICD-10-CM | POA: Diagnosis not present

## 2016-04-22 DIAGNOSIS — N186 End stage renal disease: Secondary | ICD-10-CM | POA: Diagnosis not present

## 2016-04-22 DIAGNOSIS — D631 Anemia in chronic kidney disease: Secondary | ICD-10-CM | POA: Diagnosis not present

## 2016-04-22 DIAGNOSIS — N2581 Secondary hyperparathyroidism of renal origin: Secondary | ICD-10-CM | POA: Diagnosis not present

## 2016-04-24 DIAGNOSIS — D631 Anemia in chronic kidney disease: Secondary | ICD-10-CM | POA: Diagnosis not present

## 2016-04-24 DIAGNOSIS — N186 End stage renal disease: Secondary | ICD-10-CM | POA: Diagnosis not present

## 2016-04-24 DIAGNOSIS — D509 Iron deficiency anemia, unspecified: Secondary | ICD-10-CM | POA: Diagnosis not present

## 2016-04-24 DIAGNOSIS — N2581 Secondary hyperparathyroidism of renal origin: Secondary | ICD-10-CM | POA: Diagnosis not present

## 2016-04-26 DIAGNOSIS — N2581 Secondary hyperparathyroidism of renal origin: Secondary | ICD-10-CM | POA: Diagnosis not present

## 2016-04-26 DIAGNOSIS — N186 End stage renal disease: Secondary | ICD-10-CM | POA: Diagnosis not present

## 2016-04-26 DIAGNOSIS — D631 Anemia in chronic kidney disease: Secondary | ICD-10-CM | POA: Diagnosis not present

## 2016-04-26 DIAGNOSIS — D509 Iron deficiency anemia, unspecified: Secondary | ICD-10-CM | POA: Diagnosis not present

## 2016-04-29 DIAGNOSIS — N186 End stage renal disease: Secondary | ICD-10-CM | POA: Diagnosis not present

## 2016-04-29 DIAGNOSIS — D631 Anemia in chronic kidney disease: Secondary | ICD-10-CM | POA: Diagnosis not present

## 2016-04-29 DIAGNOSIS — D509 Iron deficiency anemia, unspecified: Secondary | ICD-10-CM | POA: Diagnosis not present

## 2016-04-29 DIAGNOSIS — N2581 Secondary hyperparathyroidism of renal origin: Secondary | ICD-10-CM | POA: Diagnosis not present

## 2016-05-01 DIAGNOSIS — N2581 Secondary hyperparathyroidism of renal origin: Secondary | ICD-10-CM | POA: Diagnosis not present

## 2016-05-01 DIAGNOSIS — N186 End stage renal disease: Secondary | ICD-10-CM | POA: Diagnosis not present

## 2016-05-01 DIAGNOSIS — D509 Iron deficiency anemia, unspecified: Secondary | ICD-10-CM | POA: Diagnosis not present

## 2016-05-01 DIAGNOSIS — D631 Anemia in chronic kidney disease: Secondary | ICD-10-CM | POA: Diagnosis not present

## 2016-05-03 DIAGNOSIS — N186 End stage renal disease: Secondary | ICD-10-CM | POA: Diagnosis not present

## 2016-05-03 DIAGNOSIS — D631 Anemia in chronic kidney disease: Secondary | ICD-10-CM | POA: Diagnosis not present

## 2016-05-03 DIAGNOSIS — D509 Iron deficiency anemia, unspecified: Secondary | ICD-10-CM | POA: Diagnosis not present

## 2016-05-03 DIAGNOSIS — N2581 Secondary hyperparathyroidism of renal origin: Secondary | ICD-10-CM | POA: Diagnosis not present

## 2016-05-05 DIAGNOSIS — N186 End stage renal disease: Secondary | ICD-10-CM | POA: Diagnosis not present

## 2016-05-05 DIAGNOSIS — N2581 Secondary hyperparathyroidism of renal origin: Secondary | ICD-10-CM | POA: Diagnosis not present

## 2016-05-05 DIAGNOSIS — D631 Anemia in chronic kidney disease: Secondary | ICD-10-CM | POA: Diagnosis not present

## 2016-05-05 DIAGNOSIS — D509 Iron deficiency anemia, unspecified: Secondary | ICD-10-CM | POA: Diagnosis not present

## 2016-05-07 DIAGNOSIS — N186 End stage renal disease: Secondary | ICD-10-CM | POA: Diagnosis not present

## 2016-05-07 DIAGNOSIS — D631 Anemia in chronic kidney disease: Secondary | ICD-10-CM | POA: Diagnosis not present

## 2016-05-07 DIAGNOSIS — N2581 Secondary hyperparathyroidism of renal origin: Secondary | ICD-10-CM | POA: Diagnosis not present

## 2016-05-07 DIAGNOSIS — D509 Iron deficiency anemia, unspecified: Secondary | ICD-10-CM | POA: Diagnosis not present

## 2016-05-10 DIAGNOSIS — D631 Anemia in chronic kidney disease: Secondary | ICD-10-CM | POA: Diagnosis not present

## 2016-05-10 DIAGNOSIS — D509 Iron deficiency anemia, unspecified: Secondary | ICD-10-CM | POA: Diagnosis not present

## 2016-05-10 DIAGNOSIS — N186 End stage renal disease: Secondary | ICD-10-CM | POA: Diagnosis not present

## 2016-05-10 DIAGNOSIS — N2581 Secondary hyperparathyroidism of renal origin: Secondary | ICD-10-CM | POA: Diagnosis not present

## 2016-05-13 DIAGNOSIS — D509 Iron deficiency anemia, unspecified: Secondary | ICD-10-CM | POA: Diagnosis not present

## 2016-05-13 DIAGNOSIS — N186 End stage renal disease: Secondary | ICD-10-CM | POA: Diagnosis not present

## 2016-05-13 DIAGNOSIS — N2581 Secondary hyperparathyroidism of renal origin: Secondary | ICD-10-CM | POA: Diagnosis not present

## 2016-05-13 DIAGNOSIS — D631 Anemia in chronic kidney disease: Secondary | ICD-10-CM | POA: Diagnosis not present

## 2016-05-15 DIAGNOSIS — D509 Iron deficiency anemia, unspecified: Secondary | ICD-10-CM | POA: Diagnosis not present

## 2016-05-15 DIAGNOSIS — N186 End stage renal disease: Secondary | ICD-10-CM | POA: Diagnosis not present

## 2016-05-15 DIAGNOSIS — T8612 Kidney transplant failure: Secondary | ICD-10-CM | POA: Diagnosis not present

## 2016-05-15 DIAGNOSIS — D631 Anemia in chronic kidney disease: Secondary | ICD-10-CM | POA: Diagnosis not present

## 2016-05-15 DIAGNOSIS — N2581 Secondary hyperparathyroidism of renal origin: Secondary | ICD-10-CM | POA: Diagnosis not present

## 2016-05-15 DIAGNOSIS — Z992 Dependence on renal dialysis: Secondary | ICD-10-CM | POA: Diagnosis not present

## 2016-05-17 DIAGNOSIS — N186 End stage renal disease: Secondary | ICD-10-CM | POA: Diagnosis not present

## 2016-05-17 DIAGNOSIS — N2581 Secondary hyperparathyroidism of renal origin: Secondary | ICD-10-CM | POA: Diagnosis not present

## 2016-05-17 DIAGNOSIS — D631 Anemia in chronic kidney disease: Secondary | ICD-10-CM | POA: Diagnosis not present

## 2016-05-17 DIAGNOSIS — D509 Iron deficiency anemia, unspecified: Secondary | ICD-10-CM | POA: Diagnosis not present

## 2016-05-20 DIAGNOSIS — N186 End stage renal disease: Secondary | ICD-10-CM | POA: Diagnosis not present

## 2016-05-20 DIAGNOSIS — D509 Iron deficiency anemia, unspecified: Secondary | ICD-10-CM | POA: Diagnosis not present

## 2016-05-20 DIAGNOSIS — D631 Anemia in chronic kidney disease: Secondary | ICD-10-CM | POA: Diagnosis not present

## 2016-05-20 DIAGNOSIS — N2581 Secondary hyperparathyroidism of renal origin: Secondary | ICD-10-CM | POA: Diagnosis not present

## 2016-05-22 DIAGNOSIS — N186 End stage renal disease: Secondary | ICD-10-CM | POA: Diagnosis not present

## 2016-05-22 DIAGNOSIS — N2581 Secondary hyperparathyroidism of renal origin: Secondary | ICD-10-CM | POA: Diagnosis not present

## 2016-05-22 DIAGNOSIS — D631 Anemia in chronic kidney disease: Secondary | ICD-10-CM | POA: Diagnosis not present

## 2016-05-22 DIAGNOSIS — D509 Iron deficiency anemia, unspecified: Secondary | ICD-10-CM | POA: Diagnosis not present

## 2016-05-24 DIAGNOSIS — D509 Iron deficiency anemia, unspecified: Secondary | ICD-10-CM | POA: Diagnosis not present

## 2016-05-24 DIAGNOSIS — N2581 Secondary hyperparathyroidism of renal origin: Secondary | ICD-10-CM | POA: Diagnosis not present

## 2016-05-24 DIAGNOSIS — D631 Anemia in chronic kidney disease: Secondary | ICD-10-CM | POA: Diagnosis not present

## 2016-05-24 DIAGNOSIS — N186 End stage renal disease: Secondary | ICD-10-CM | POA: Diagnosis not present

## 2016-05-27 DIAGNOSIS — N2581 Secondary hyperparathyroidism of renal origin: Secondary | ICD-10-CM | POA: Diagnosis not present

## 2016-05-27 DIAGNOSIS — N186 End stage renal disease: Secondary | ICD-10-CM | POA: Diagnosis not present

## 2016-05-27 DIAGNOSIS — D631 Anemia in chronic kidney disease: Secondary | ICD-10-CM | POA: Diagnosis not present

## 2016-05-27 DIAGNOSIS — D509 Iron deficiency anemia, unspecified: Secondary | ICD-10-CM | POA: Diagnosis not present

## 2016-05-29 DIAGNOSIS — N186 End stage renal disease: Secondary | ICD-10-CM | POA: Diagnosis not present

## 2016-05-29 DIAGNOSIS — N2581 Secondary hyperparathyroidism of renal origin: Secondary | ICD-10-CM | POA: Diagnosis not present

## 2016-05-29 DIAGNOSIS — D631 Anemia in chronic kidney disease: Secondary | ICD-10-CM | POA: Diagnosis not present

## 2016-05-29 DIAGNOSIS — D509 Iron deficiency anemia, unspecified: Secondary | ICD-10-CM | POA: Diagnosis not present

## 2016-05-31 DIAGNOSIS — D509 Iron deficiency anemia, unspecified: Secondary | ICD-10-CM | POA: Diagnosis not present

## 2016-05-31 DIAGNOSIS — D631 Anemia in chronic kidney disease: Secondary | ICD-10-CM | POA: Diagnosis not present

## 2016-05-31 DIAGNOSIS — N186 End stage renal disease: Secondary | ICD-10-CM | POA: Diagnosis not present

## 2016-05-31 DIAGNOSIS — N2581 Secondary hyperparathyroidism of renal origin: Secondary | ICD-10-CM | POA: Diagnosis not present

## 2016-06-03 DIAGNOSIS — N2581 Secondary hyperparathyroidism of renal origin: Secondary | ICD-10-CM | POA: Diagnosis not present

## 2016-06-03 DIAGNOSIS — D509 Iron deficiency anemia, unspecified: Secondary | ICD-10-CM | POA: Diagnosis not present

## 2016-06-03 DIAGNOSIS — N186 End stage renal disease: Secondary | ICD-10-CM | POA: Diagnosis not present

## 2016-06-03 DIAGNOSIS — D631 Anemia in chronic kidney disease: Secondary | ICD-10-CM | POA: Diagnosis not present

## 2016-06-05 DIAGNOSIS — N186 End stage renal disease: Secondary | ICD-10-CM | POA: Diagnosis not present

## 2016-06-05 DIAGNOSIS — D509 Iron deficiency anemia, unspecified: Secondary | ICD-10-CM | POA: Diagnosis not present

## 2016-06-05 DIAGNOSIS — D631 Anemia in chronic kidney disease: Secondary | ICD-10-CM | POA: Diagnosis not present

## 2016-06-05 DIAGNOSIS — N2581 Secondary hyperparathyroidism of renal origin: Secondary | ICD-10-CM | POA: Diagnosis not present

## 2016-06-07 DIAGNOSIS — D631 Anemia in chronic kidney disease: Secondary | ICD-10-CM | POA: Diagnosis not present

## 2016-06-07 DIAGNOSIS — N186 End stage renal disease: Secondary | ICD-10-CM | POA: Diagnosis not present

## 2016-06-07 DIAGNOSIS — N2581 Secondary hyperparathyroidism of renal origin: Secondary | ICD-10-CM | POA: Diagnosis not present

## 2016-06-07 DIAGNOSIS — D509 Iron deficiency anemia, unspecified: Secondary | ICD-10-CM | POA: Diagnosis not present

## 2016-06-10 DIAGNOSIS — N2581 Secondary hyperparathyroidism of renal origin: Secondary | ICD-10-CM | POA: Diagnosis not present

## 2016-06-10 DIAGNOSIS — D509 Iron deficiency anemia, unspecified: Secondary | ICD-10-CM | POA: Diagnosis not present

## 2016-06-10 DIAGNOSIS — N186 End stage renal disease: Secondary | ICD-10-CM | POA: Diagnosis not present

## 2016-06-10 DIAGNOSIS — D631 Anemia in chronic kidney disease: Secondary | ICD-10-CM | POA: Diagnosis not present

## 2016-06-12 DIAGNOSIS — D509 Iron deficiency anemia, unspecified: Secondary | ICD-10-CM | POA: Diagnosis not present

## 2016-06-12 DIAGNOSIS — N186 End stage renal disease: Secondary | ICD-10-CM | POA: Diagnosis not present

## 2016-06-12 DIAGNOSIS — N2581 Secondary hyperparathyroidism of renal origin: Secondary | ICD-10-CM | POA: Diagnosis not present

## 2016-06-12 DIAGNOSIS — D631 Anemia in chronic kidney disease: Secondary | ICD-10-CM | POA: Diagnosis not present

## 2016-06-14 DIAGNOSIS — D631 Anemia in chronic kidney disease: Secondary | ICD-10-CM | POA: Diagnosis not present

## 2016-06-14 DIAGNOSIS — N186 End stage renal disease: Secondary | ICD-10-CM | POA: Diagnosis not present

## 2016-06-14 DIAGNOSIS — N2581 Secondary hyperparathyroidism of renal origin: Secondary | ICD-10-CM | POA: Diagnosis not present

## 2016-06-14 DIAGNOSIS — D509 Iron deficiency anemia, unspecified: Secondary | ICD-10-CM | POA: Diagnosis not present

## 2016-06-15 DIAGNOSIS — T8612 Kidney transplant failure: Secondary | ICD-10-CM | POA: Diagnosis not present

## 2016-06-15 DIAGNOSIS — N186 End stage renal disease: Secondary | ICD-10-CM | POA: Diagnosis not present

## 2016-06-15 DIAGNOSIS — Z992 Dependence on renal dialysis: Secondary | ICD-10-CM | POA: Diagnosis not present

## 2016-06-17 DIAGNOSIS — R7989 Other specified abnormal findings of blood chemistry: Secondary | ICD-10-CM | POA: Diagnosis not present

## 2016-06-17 DIAGNOSIS — D631 Anemia in chronic kidney disease: Secondary | ICD-10-CM | POA: Diagnosis not present

## 2016-06-17 DIAGNOSIS — N2581 Secondary hyperparathyroidism of renal origin: Secondary | ICD-10-CM | POA: Diagnosis not present

## 2016-06-17 DIAGNOSIS — D509 Iron deficiency anemia, unspecified: Secondary | ICD-10-CM | POA: Diagnosis not present

## 2016-06-17 DIAGNOSIS — N186 End stage renal disease: Secondary | ICD-10-CM | POA: Diagnosis not present

## 2016-06-19 DIAGNOSIS — D509 Iron deficiency anemia, unspecified: Secondary | ICD-10-CM | POA: Diagnosis not present

## 2016-06-19 DIAGNOSIS — R7989 Other specified abnormal findings of blood chemistry: Secondary | ICD-10-CM | POA: Diagnosis not present

## 2016-06-19 DIAGNOSIS — N2581 Secondary hyperparathyroidism of renal origin: Secondary | ICD-10-CM | POA: Diagnosis not present

## 2016-06-19 DIAGNOSIS — D631 Anemia in chronic kidney disease: Secondary | ICD-10-CM | POA: Diagnosis not present

## 2016-06-19 DIAGNOSIS — N186 End stage renal disease: Secondary | ICD-10-CM | POA: Diagnosis not present

## 2016-06-21 DIAGNOSIS — N186 End stage renal disease: Secondary | ICD-10-CM | POA: Diagnosis not present

## 2016-06-21 DIAGNOSIS — D509 Iron deficiency anemia, unspecified: Secondary | ICD-10-CM | POA: Diagnosis not present

## 2016-06-21 DIAGNOSIS — R7989 Other specified abnormal findings of blood chemistry: Secondary | ICD-10-CM | POA: Diagnosis not present

## 2016-06-21 DIAGNOSIS — N2581 Secondary hyperparathyroidism of renal origin: Secondary | ICD-10-CM | POA: Diagnosis not present

## 2016-06-21 DIAGNOSIS — D631 Anemia in chronic kidney disease: Secondary | ICD-10-CM | POA: Diagnosis not present

## 2016-06-24 DIAGNOSIS — R7989 Other specified abnormal findings of blood chemistry: Secondary | ICD-10-CM | POA: Diagnosis not present

## 2016-06-24 DIAGNOSIS — N186 End stage renal disease: Secondary | ICD-10-CM | POA: Diagnosis not present

## 2016-06-24 DIAGNOSIS — D509 Iron deficiency anemia, unspecified: Secondary | ICD-10-CM | POA: Diagnosis not present

## 2016-06-24 DIAGNOSIS — N2581 Secondary hyperparathyroidism of renal origin: Secondary | ICD-10-CM | POA: Diagnosis not present

## 2016-06-24 DIAGNOSIS — D631 Anemia in chronic kidney disease: Secondary | ICD-10-CM | POA: Diagnosis not present

## 2016-06-26 DIAGNOSIS — N2581 Secondary hyperparathyroidism of renal origin: Secondary | ICD-10-CM | POA: Diagnosis not present

## 2016-06-26 DIAGNOSIS — N186 End stage renal disease: Secondary | ICD-10-CM | POA: Diagnosis not present

## 2016-06-26 DIAGNOSIS — R7989 Other specified abnormal findings of blood chemistry: Secondary | ICD-10-CM | POA: Diagnosis not present

## 2016-06-26 DIAGNOSIS — D631 Anemia in chronic kidney disease: Secondary | ICD-10-CM | POA: Diagnosis not present

## 2016-06-26 DIAGNOSIS — D509 Iron deficiency anemia, unspecified: Secondary | ICD-10-CM | POA: Diagnosis not present

## 2016-06-28 DIAGNOSIS — N2581 Secondary hyperparathyroidism of renal origin: Secondary | ICD-10-CM | POA: Diagnosis not present

## 2016-06-28 DIAGNOSIS — N186 End stage renal disease: Secondary | ICD-10-CM | POA: Diagnosis not present

## 2016-06-28 DIAGNOSIS — D509 Iron deficiency anemia, unspecified: Secondary | ICD-10-CM | POA: Diagnosis not present

## 2016-06-28 DIAGNOSIS — R7989 Other specified abnormal findings of blood chemistry: Secondary | ICD-10-CM | POA: Diagnosis not present

## 2016-06-28 DIAGNOSIS — D631 Anemia in chronic kidney disease: Secondary | ICD-10-CM | POA: Diagnosis not present

## 2016-07-01 DIAGNOSIS — D509 Iron deficiency anemia, unspecified: Secondary | ICD-10-CM | POA: Diagnosis not present

## 2016-07-01 DIAGNOSIS — N2581 Secondary hyperparathyroidism of renal origin: Secondary | ICD-10-CM | POA: Diagnosis not present

## 2016-07-01 DIAGNOSIS — N186 End stage renal disease: Secondary | ICD-10-CM | POA: Diagnosis not present

## 2016-07-01 DIAGNOSIS — R7989 Other specified abnormal findings of blood chemistry: Secondary | ICD-10-CM | POA: Diagnosis not present

## 2016-07-01 DIAGNOSIS — D631 Anemia in chronic kidney disease: Secondary | ICD-10-CM | POA: Diagnosis not present

## 2016-07-03 DIAGNOSIS — N2581 Secondary hyperparathyroidism of renal origin: Secondary | ICD-10-CM | POA: Diagnosis not present

## 2016-07-03 DIAGNOSIS — N186 End stage renal disease: Secondary | ICD-10-CM | POA: Diagnosis not present

## 2016-07-03 DIAGNOSIS — R7989 Other specified abnormal findings of blood chemistry: Secondary | ICD-10-CM | POA: Diagnosis not present

## 2016-07-03 DIAGNOSIS — D509 Iron deficiency anemia, unspecified: Secondary | ICD-10-CM | POA: Diagnosis not present

## 2016-07-03 DIAGNOSIS — D631 Anemia in chronic kidney disease: Secondary | ICD-10-CM | POA: Diagnosis not present

## 2016-07-05 DIAGNOSIS — D509 Iron deficiency anemia, unspecified: Secondary | ICD-10-CM | POA: Diagnosis not present

## 2016-07-05 DIAGNOSIS — R7989 Other specified abnormal findings of blood chemistry: Secondary | ICD-10-CM | POA: Diagnosis not present

## 2016-07-05 DIAGNOSIS — D631 Anemia in chronic kidney disease: Secondary | ICD-10-CM | POA: Diagnosis not present

## 2016-07-05 DIAGNOSIS — N186 End stage renal disease: Secondary | ICD-10-CM | POA: Diagnosis not present

## 2016-07-05 DIAGNOSIS — N2581 Secondary hyperparathyroidism of renal origin: Secondary | ICD-10-CM | POA: Diagnosis not present

## 2016-07-08 DIAGNOSIS — D631 Anemia in chronic kidney disease: Secondary | ICD-10-CM | POA: Diagnosis not present

## 2016-07-08 DIAGNOSIS — N2581 Secondary hyperparathyroidism of renal origin: Secondary | ICD-10-CM | POA: Diagnosis not present

## 2016-07-08 DIAGNOSIS — N186 End stage renal disease: Secondary | ICD-10-CM | POA: Diagnosis not present

## 2016-07-08 DIAGNOSIS — R7989 Other specified abnormal findings of blood chemistry: Secondary | ICD-10-CM | POA: Diagnosis not present

## 2016-07-08 DIAGNOSIS — D509 Iron deficiency anemia, unspecified: Secondary | ICD-10-CM | POA: Diagnosis not present

## 2016-07-10 DIAGNOSIS — R7989 Other specified abnormal findings of blood chemistry: Secondary | ICD-10-CM | POA: Diagnosis not present

## 2016-07-10 DIAGNOSIS — D509 Iron deficiency anemia, unspecified: Secondary | ICD-10-CM | POA: Diagnosis not present

## 2016-07-10 DIAGNOSIS — D631 Anemia in chronic kidney disease: Secondary | ICD-10-CM | POA: Diagnosis not present

## 2016-07-10 DIAGNOSIS — N186 End stage renal disease: Secondary | ICD-10-CM | POA: Diagnosis not present

## 2016-07-10 DIAGNOSIS — N2581 Secondary hyperparathyroidism of renal origin: Secondary | ICD-10-CM | POA: Diagnosis not present

## 2016-07-12 DIAGNOSIS — D509 Iron deficiency anemia, unspecified: Secondary | ICD-10-CM | POA: Diagnosis not present

## 2016-07-12 DIAGNOSIS — N186 End stage renal disease: Secondary | ICD-10-CM | POA: Diagnosis not present

## 2016-07-12 DIAGNOSIS — R7989 Other specified abnormal findings of blood chemistry: Secondary | ICD-10-CM | POA: Diagnosis not present

## 2016-07-12 DIAGNOSIS — N2581 Secondary hyperparathyroidism of renal origin: Secondary | ICD-10-CM | POA: Diagnosis not present

## 2016-07-12 DIAGNOSIS — D631 Anemia in chronic kidney disease: Secondary | ICD-10-CM | POA: Diagnosis not present

## 2016-07-15 DIAGNOSIS — R7989 Other specified abnormal findings of blood chemistry: Secondary | ICD-10-CM | POA: Diagnosis not present

## 2016-07-15 DIAGNOSIS — N186 End stage renal disease: Secondary | ICD-10-CM | POA: Diagnosis not present

## 2016-07-15 DIAGNOSIS — D509 Iron deficiency anemia, unspecified: Secondary | ICD-10-CM | POA: Diagnosis not present

## 2016-07-15 DIAGNOSIS — N2581 Secondary hyperparathyroidism of renal origin: Secondary | ICD-10-CM | POA: Diagnosis not present

## 2016-07-15 DIAGNOSIS — D631 Anemia in chronic kidney disease: Secondary | ICD-10-CM | POA: Diagnosis not present

## 2016-07-16 DIAGNOSIS — Z992 Dependence on renal dialysis: Secondary | ICD-10-CM | POA: Diagnosis not present

## 2016-07-16 DIAGNOSIS — N186 End stage renal disease: Secondary | ICD-10-CM | POA: Diagnosis not present

## 2016-07-16 DIAGNOSIS — T8612 Kidney transplant failure: Secondary | ICD-10-CM | POA: Diagnosis not present

## 2016-07-17 DIAGNOSIS — D631 Anemia in chronic kidney disease: Secondary | ICD-10-CM | POA: Diagnosis not present

## 2016-07-17 DIAGNOSIS — N186 End stage renal disease: Secondary | ICD-10-CM | POA: Diagnosis not present

## 2016-07-17 DIAGNOSIS — D509 Iron deficiency anemia, unspecified: Secondary | ICD-10-CM | POA: Diagnosis not present

## 2016-07-17 DIAGNOSIS — N2581 Secondary hyperparathyroidism of renal origin: Secondary | ICD-10-CM | POA: Diagnosis not present

## 2016-07-19 DIAGNOSIS — N186 End stage renal disease: Secondary | ICD-10-CM | POA: Diagnosis not present

## 2016-07-19 DIAGNOSIS — D631 Anemia in chronic kidney disease: Secondary | ICD-10-CM | POA: Diagnosis not present

## 2016-07-19 DIAGNOSIS — D509 Iron deficiency anemia, unspecified: Secondary | ICD-10-CM | POA: Diagnosis not present

## 2016-07-19 DIAGNOSIS — N2581 Secondary hyperparathyroidism of renal origin: Secondary | ICD-10-CM | POA: Diagnosis not present

## 2016-07-22 DIAGNOSIS — D509 Iron deficiency anemia, unspecified: Secondary | ICD-10-CM | POA: Diagnosis not present

## 2016-07-22 DIAGNOSIS — D631 Anemia in chronic kidney disease: Secondary | ICD-10-CM | POA: Diagnosis not present

## 2016-07-22 DIAGNOSIS — N2581 Secondary hyperparathyroidism of renal origin: Secondary | ICD-10-CM | POA: Diagnosis not present

## 2016-07-22 DIAGNOSIS — N186 End stage renal disease: Secondary | ICD-10-CM | POA: Diagnosis not present

## 2016-07-24 DIAGNOSIS — N186 End stage renal disease: Secondary | ICD-10-CM | POA: Diagnosis not present

## 2016-07-24 DIAGNOSIS — N2581 Secondary hyperparathyroidism of renal origin: Secondary | ICD-10-CM | POA: Diagnosis not present

## 2016-07-24 DIAGNOSIS — D509 Iron deficiency anemia, unspecified: Secondary | ICD-10-CM | POA: Diagnosis not present

## 2016-07-24 DIAGNOSIS — D631 Anemia in chronic kidney disease: Secondary | ICD-10-CM | POA: Diagnosis not present

## 2016-07-26 DIAGNOSIS — N186 End stage renal disease: Secondary | ICD-10-CM | POA: Diagnosis not present

## 2016-07-26 DIAGNOSIS — D631 Anemia in chronic kidney disease: Secondary | ICD-10-CM | POA: Diagnosis not present

## 2016-07-26 DIAGNOSIS — N2581 Secondary hyperparathyroidism of renal origin: Secondary | ICD-10-CM | POA: Diagnosis not present

## 2016-07-26 DIAGNOSIS — D509 Iron deficiency anemia, unspecified: Secondary | ICD-10-CM | POA: Diagnosis not present

## 2016-07-29 DIAGNOSIS — D631 Anemia in chronic kidney disease: Secondary | ICD-10-CM | POA: Diagnosis not present

## 2016-07-29 DIAGNOSIS — N2581 Secondary hyperparathyroidism of renal origin: Secondary | ICD-10-CM | POA: Diagnosis not present

## 2016-07-29 DIAGNOSIS — N186 End stage renal disease: Secondary | ICD-10-CM | POA: Diagnosis not present

## 2016-07-29 DIAGNOSIS — D509 Iron deficiency anemia, unspecified: Secondary | ICD-10-CM | POA: Diagnosis not present

## 2016-07-31 DIAGNOSIS — N2581 Secondary hyperparathyroidism of renal origin: Secondary | ICD-10-CM | POA: Diagnosis not present

## 2016-07-31 DIAGNOSIS — D509 Iron deficiency anemia, unspecified: Secondary | ICD-10-CM | POA: Diagnosis not present

## 2016-07-31 DIAGNOSIS — D631 Anemia in chronic kidney disease: Secondary | ICD-10-CM | POA: Diagnosis not present

## 2016-07-31 DIAGNOSIS — N186 End stage renal disease: Secondary | ICD-10-CM | POA: Diagnosis not present

## 2016-08-02 DIAGNOSIS — D631 Anemia in chronic kidney disease: Secondary | ICD-10-CM | POA: Diagnosis not present

## 2016-08-02 DIAGNOSIS — N186 End stage renal disease: Secondary | ICD-10-CM | POA: Diagnosis not present

## 2016-08-02 DIAGNOSIS — D509 Iron deficiency anemia, unspecified: Secondary | ICD-10-CM | POA: Diagnosis not present

## 2016-08-02 DIAGNOSIS — N2581 Secondary hyperparathyroidism of renal origin: Secondary | ICD-10-CM | POA: Diagnosis not present

## 2016-08-05 DIAGNOSIS — N2581 Secondary hyperparathyroidism of renal origin: Secondary | ICD-10-CM | POA: Diagnosis not present

## 2016-08-05 DIAGNOSIS — D509 Iron deficiency anemia, unspecified: Secondary | ICD-10-CM | POA: Diagnosis not present

## 2016-08-05 DIAGNOSIS — N186 End stage renal disease: Secondary | ICD-10-CM | POA: Diagnosis not present

## 2016-08-05 DIAGNOSIS — D631 Anemia in chronic kidney disease: Secondary | ICD-10-CM | POA: Diagnosis not present

## 2016-08-07 DIAGNOSIS — D631 Anemia in chronic kidney disease: Secondary | ICD-10-CM | POA: Diagnosis not present

## 2016-08-07 DIAGNOSIS — D509 Iron deficiency anemia, unspecified: Secondary | ICD-10-CM | POA: Diagnosis not present

## 2016-08-07 DIAGNOSIS — N2581 Secondary hyperparathyroidism of renal origin: Secondary | ICD-10-CM | POA: Diagnosis not present

## 2016-08-07 DIAGNOSIS — N186 End stage renal disease: Secondary | ICD-10-CM | POA: Diagnosis not present

## 2016-08-09 DIAGNOSIS — D631 Anemia in chronic kidney disease: Secondary | ICD-10-CM | POA: Diagnosis not present

## 2016-08-09 DIAGNOSIS — N186 End stage renal disease: Secondary | ICD-10-CM | POA: Diagnosis not present

## 2016-08-09 DIAGNOSIS — D509 Iron deficiency anemia, unspecified: Secondary | ICD-10-CM | POA: Diagnosis not present

## 2016-08-09 DIAGNOSIS — N2581 Secondary hyperparathyroidism of renal origin: Secondary | ICD-10-CM | POA: Diagnosis not present

## 2016-08-12 DIAGNOSIS — N2581 Secondary hyperparathyroidism of renal origin: Secondary | ICD-10-CM | POA: Diagnosis not present

## 2016-08-12 DIAGNOSIS — N186 End stage renal disease: Secondary | ICD-10-CM | POA: Diagnosis not present

## 2016-08-12 DIAGNOSIS — D631 Anemia in chronic kidney disease: Secondary | ICD-10-CM | POA: Diagnosis not present

## 2016-08-12 DIAGNOSIS — D509 Iron deficiency anemia, unspecified: Secondary | ICD-10-CM | POA: Diagnosis not present

## 2016-08-13 DIAGNOSIS — Z992 Dependence on renal dialysis: Secondary | ICD-10-CM | POA: Diagnosis not present

## 2016-08-13 DIAGNOSIS — N186 End stage renal disease: Secondary | ICD-10-CM | POA: Diagnosis not present

## 2016-08-13 DIAGNOSIS — T8612 Kidney transplant failure: Secondary | ICD-10-CM | POA: Diagnosis not present

## 2016-08-14 DIAGNOSIS — D631 Anemia in chronic kidney disease: Secondary | ICD-10-CM | POA: Diagnosis not present

## 2016-08-14 DIAGNOSIS — N2581 Secondary hyperparathyroidism of renal origin: Secondary | ICD-10-CM | POA: Diagnosis not present

## 2016-08-14 DIAGNOSIS — D509 Iron deficiency anemia, unspecified: Secondary | ICD-10-CM | POA: Diagnosis not present

## 2016-08-14 DIAGNOSIS — N186 End stage renal disease: Secondary | ICD-10-CM | POA: Diagnosis not present

## 2016-08-16 ENCOUNTER — Emergency Department (HOSPITAL_COMMUNITY): Payer: Medicare Other

## 2016-08-16 ENCOUNTER — Emergency Department (HOSPITAL_COMMUNITY)
Admission: EM | Admit: 2016-08-16 | Discharge: 2016-08-16 | Disposition: A | Discharge status: Home / Self Care | Payer: Medicare Other | Attending: Emergency Medicine | Admitting: Emergency Medicine

## 2016-08-16 ENCOUNTER — Encounter (HOSPITAL_COMMUNITY): Payer: Self-pay | Admitting: *Deleted

## 2016-08-16 DIAGNOSIS — Z79899 Other long term (current) drug therapy: Secondary | ICD-10-CM | POA: Insufficient documentation

## 2016-08-16 DIAGNOSIS — I120 Hypertensive chronic kidney disease with stage 5 chronic kidney disease or end stage renal disease: Secondary | ICD-10-CM | POA: Insufficient documentation

## 2016-08-16 DIAGNOSIS — N186 End stage renal disease: Secondary | ICD-10-CM | POA: Insufficient documentation

## 2016-08-16 DIAGNOSIS — Y929 Unspecified place or not applicable: Secondary | ICD-10-CM | POA: Diagnosis not present

## 2016-08-16 DIAGNOSIS — Z87891 Personal history of nicotine dependence: Secondary | ICD-10-CM | POA: Insufficient documentation

## 2016-08-16 DIAGNOSIS — D509 Iron deficiency anemia, unspecified: Secondary | ICD-10-CM | POA: Diagnosis not present

## 2016-08-16 DIAGNOSIS — Y990 Civilian activity done for income or pay: Secondary | ICD-10-CM | POA: Insufficient documentation

## 2016-08-16 DIAGNOSIS — N2581 Secondary hyperparathyroidism of renal origin: Secondary | ICD-10-CM | POA: Diagnosis not present

## 2016-08-16 DIAGNOSIS — Y9301 Activity, walking, marching and hiking: Secondary | ICD-10-CM | POA: Diagnosis not present

## 2016-08-16 DIAGNOSIS — Z992 Dependence on renal dialysis: Secondary | ICD-10-CM | POA: Insufficient documentation

## 2016-08-16 DIAGNOSIS — X501XXA Overexertion from prolonged static or awkward postures, initial encounter: Secondary | ICD-10-CM | POA: Insufficient documentation

## 2016-08-16 DIAGNOSIS — M25571 Pain in right ankle and joints of right foot: Secondary | ICD-10-CM | POA: Diagnosis not present

## 2016-08-16 DIAGNOSIS — M79671 Pain in right foot: Secondary | ICD-10-CM | POA: Diagnosis not present

## 2016-08-16 DIAGNOSIS — D631 Anemia in chronic kidney disease: Secondary | ICD-10-CM | POA: Diagnosis not present

## 2016-08-16 NOTE — ED Notes (Signed)
Declined W/C at D/C and was escorted to lobby by RN. 

## 2016-08-16 NOTE — Discharge Instructions (Signed)
Her x-rays were negative for fractures. This is likely an Achilles tendinitis. Have given you stretches to perform to help. Wouldn't suggest heel lifts. Have given a referral to orthopedics. Motrin and Tylenol for pain. Rest, ice, elevate the right foot. Weightbearing as tolerated. Return to the ED if your symptoms worsen.

## 2016-08-16 NOTE — ED Provider Notes (Signed)
[place] DEPT Provider Note   CSN: [id number] Arrival date & time: 08/16/16  1225  By signing my name below, I, [name], attest that this documentation has been prepared under the direction and in the presence of non-physician practitioner, Melina Schools, PA-C. Electronically Signed: Jeanell Mcintosh, Scribe. 08/16/2016. 2:02 PM.   History   Chief Complaint Chief Complaint  Patient presents with  . Leg Pain   The history is provided by the patient. No language interpreter was used.   HPI Comments: [name] is a 31 y.o. male who presents to the Emergency Department complaining of constant moderate right heel pain that started yesterday. 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 His pain is relieved by rest, unrelieved by applied ice, and exacerbated by weight-bearing. He describes the heel pain as sharp. He denies any prior hx of similar complaint or other complaints.  States that walking on the anterior portion of his foot improves the pain. Denies any redness, fevers, chills, nausea, emesis  Past Medical History:  Diagnosis Date  . Alport syndrome   . Alport's syndrome    EFFECT KIDNEYS+ HEARING  . Anemia   . Blood dyscrasia   . GERD (gastroesophageal reflux disease)   . GERD (gastroesophageal reflux disease)   . Headache   . Heart murmur    ASYMPTOMATIC  . Kidney failure as a baby   went on dialysis age 71      T66  . Sickle cell trait [facility])     Patient Active Problem List   Diagnosis Date Noted  . Secondary hyperparathyroidism of renal origin ([place]) 12/03/2015  . Hyperparathyroidism, secondary ([place]) 12/02/2015  . ESRD on dialysis ([place]) 12/02/2015  . Acute dyspnea 04/25/2014  . Flash pulmonary edema ([place]) 04/25/2014  . H/O kidney transplant 11/12/2007  . Alport syndrome 06/29/1995  . Essential (primary) hypertension 06/19/1995    Past Surgical History:  Procedure Laterality Date  . AV  FISTULA PLACEMENT     LEFT ARM  . KIDNEY TRANSPLANT    . KIDNEY TRANSPLANT    . NEPHRECTOMY TRANSPLANTED ORGAN    . PARATHYROIDECTOMY N/A 12/03/2015   Procedure: TOTAL PARATHYROIDECTOMY WITH  AUTOTRANSPLANT TO RIGHT FOREARM ;  Surgeon: Armandina Gemma, MD;  Location: [place];  Service: General;  Laterality: N/A;  . transplant kidney removed Right 03/20/2014       Home Medications    Prior to Admission medications   Medication Sig Start Date End Date Taking? Authorizing Provider  acetaminophen (TYLENOL) 325 MG tablet Take 325 mg by mouth daily as needed.    Historical Provider, MD  calcitRIOL (ROCALTROL) 0.25 MCG capsule Take 0.5 mcg by mouth daily.     Historical Provider, MD  diphenhydrAMINE (SOMINEX) 25 MG tablet Take 25 mg by mouth at bedtime as needed for itching or sleep.    Historical Provider, MD  epoetin alfa (EPOGEN,PROCRIT) 2000 UNIT/ML injection Inject 4,000 Units into the skin 3 (three) times a week. 12/02/13   Historical Provider, MD  lisinopril (PRINIVIL,ZESTRIL) 10 MG tablet Take 20 mg by mouth at bedtime.  11/04/15   Historical Provider, MD  multivitamin (RENA-VIT) TABS tablet Take 1 tablet by mouth at bedtime. 04/27/14   Dellia Nims, MD  sevelamer carbonate (RENVELA) 800 MG tablet Take 3,200 mg by mouth 3 (three) times daily with meals.    Historical Provider, MD    Family History Family History  Problem Relation Age of Onset  .  Cancer Mother     Social History Social History  Substance Use Topics  . Smoking status: Former Smoker    Types: Cigarettes    Quit date: 12/04/2012  . Smokeless tobacco: Not on file  . Alcohol use No     Allergies   Patient has no known allergies.   Review of Systems Review of Systems  Constitutional: Negative for fever.  Musculoskeletal: Positive for myalgias.  Skin: Negative for color change and wound.  Neurological: Negative for weakness and numbness.  All other systems reviewed and are negative.    Physical Exam Updated  Vital Signs BP 119/78 (BP Location: Right Arm)   Pulse 100   Temp 98.3 F (36.8 C) (Oral)   Resp 18   SpO2 100%   Physical Exam  Constitutional: He appears well-developed and well-nourished. No distress.  HENT:  Head: Normocephalic and atraumatic.  Eyes: Conjunctivae are normal.  Neck: Neck supple.  Cardiovascular: Normal rate and intact distal pulses.   Pulmonary/Chest: Effort normal.  Abdominal: Soft.  Musculoskeletal: Normal range of motion.  Mild edema to the back of achilles tendon at the insertion site. Pain is worse with dorsiflexion and relieved by plantar flexion. No erythema or warmth noted. DP pulse is +2. Strength is 5/5. Cap refill is normal. Sensation intact. Pt is ambulatory with mild limp. Normal calf squeeze test.   Neurological: He is alert.  Skin: Skin is warm and dry. Capillary refill takes less than 2 seconds.  Psychiatric: He has a normal mood and affect.  Nursing note and vitals reviewed.    ED Treatments / Results  DIAGNOSTIC STUDIES: Oxygen Saturation is 100% on RA, normal by my interpretation.    COORDINATION OF CARE: 2:06 PM- Pt advised of plan for treatment and pt agrees.  Labs (all labs ordered are listed, but only abnormal results are displayed) Labs Reviewed - No data to display  EKG  EKG Interpretation None       Radiology Dg Ankle Complete Right  Result Date: 08/16/2016 CLINICAL DATA:  Posterior right ankle pain for several days. No injury. EXAM: RIGHT ANKLE - COMPLETE 3+ VIEW COMPARISON:  None. FINDINGS: There is no evidence of fracture, dislocation, or joint effusion. There is no evidence of arthropathy or other focal bone abnormality. Soft tissues are unremarkable. IMPRESSION: Negative. Electronically Signed   By: Logan Bores M.D.   On: 08/16/2016 14:08    Procedures Procedures (including critical care time)  Medications Ordered in ED Medications - No data to display   Initial Impression / Assessment and Plan / ED Course  I  have reviewed the triage vital signs and the nursing notes.  Pertinent labs & imaging results that were available during my care of the patient were reviewed by me and considered in my medical decision making (see chart for details).     Pt presents to the ED with right heel pain. Exam and history is consistent with achilles tendonitis. Patient X-Ray negative for obvious fracture or dislocation. Pain managed in ED. Pt advised to follow up with orthopedics if symptoms persist for possibility of missed fracture diagnosis. Patient given brace while in ED, conservative therapy recommended and discussed. Patient will be dc home & is agreeable with above plan. Achilles tendon stretches dicussed.   Final Clinical Impressions(s) / ED Diagnoses   Final diagnoses:  Pain of right heel    New Prescriptions Discharge Medication List as of 08/16/2016  2:30 PM     I personally performed the services described  in this documentation, which was scribed in my presence. The recorded information has been reviewed and is accurate.    Doristine Devoid, PA-C 08/17/16 [id number]    Leo Grosser, MD 08/17/16 [phone]

## 2016-08-16 NOTE — ED Triage Notes (Signed)
Pt having pain to right posterior ankle for several days, no specific injury. Ambulatory at triage.

## 2016-08-16 NOTE — ED Notes (Signed)
Patient transported to X-ray 

## 2016-08-19 DIAGNOSIS — D509 Iron deficiency anemia, unspecified: Secondary | ICD-10-CM | POA: Diagnosis not present

## 2016-08-19 DIAGNOSIS — D631 Anemia in chronic kidney disease: Secondary | ICD-10-CM | POA: Diagnosis not present

## 2016-08-19 DIAGNOSIS — N186 End stage renal disease: Secondary | ICD-10-CM | POA: Diagnosis not present

## 2016-08-19 DIAGNOSIS — N2581 Secondary hyperparathyroidism of renal origin: Secondary | ICD-10-CM | POA: Diagnosis not present

## 2016-08-21 DIAGNOSIS — D631 Anemia in chronic kidney disease: Secondary | ICD-10-CM | POA: Diagnosis not present

## 2016-08-21 DIAGNOSIS — D509 Iron deficiency anemia, unspecified: Secondary | ICD-10-CM | POA: Diagnosis not present

## 2016-08-21 DIAGNOSIS — N186 End stage renal disease: Secondary | ICD-10-CM | POA: Diagnosis not present

## 2016-08-21 DIAGNOSIS — N2581 Secondary hyperparathyroidism of renal origin: Secondary | ICD-10-CM | POA: Diagnosis not present

## 2016-08-23 DIAGNOSIS — D509 Iron deficiency anemia, unspecified: Secondary | ICD-10-CM | POA: Diagnosis not present

## 2016-08-23 DIAGNOSIS — N186 End stage renal disease: Secondary | ICD-10-CM | POA: Diagnosis not present

## 2016-08-23 DIAGNOSIS — N2581 Secondary hyperparathyroidism of renal origin: Secondary | ICD-10-CM | POA: Diagnosis not present

## 2016-08-23 DIAGNOSIS — D631 Anemia in chronic kidney disease: Secondary | ICD-10-CM | POA: Diagnosis not present

## 2016-08-26 DIAGNOSIS — N186 End stage renal disease: Secondary | ICD-10-CM | POA: Diagnosis not present

## 2016-08-26 DIAGNOSIS — D509 Iron deficiency anemia, unspecified: Secondary | ICD-10-CM | POA: Diagnosis not present

## 2016-08-26 DIAGNOSIS — D631 Anemia in chronic kidney disease: Secondary | ICD-10-CM | POA: Diagnosis not present

## 2016-08-26 DIAGNOSIS — N2581 Secondary hyperparathyroidism of renal origin: Secondary | ICD-10-CM | POA: Diagnosis not present

## 2016-08-28 DIAGNOSIS — N186 End stage renal disease: Secondary | ICD-10-CM | POA: Diagnosis not present

## 2016-08-28 DIAGNOSIS — N2581 Secondary hyperparathyroidism of renal origin: Secondary | ICD-10-CM | POA: Diagnosis not present

## 2016-08-28 DIAGNOSIS — D509 Iron deficiency anemia, unspecified: Secondary | ICD-10-CM | POA: Diagnosis not present

## 2016-08-28 DIAGNOSIS — D631 Anemia in chronic kidney disease: Secondary | ICD-10-CM | POA: Diagnosis not present

## 2016-08-30 DIAGNOSIS — D509 Iron deficiency anemia, unspecified: Secondary | ICD-10-CM | POA: Diagnosis not present

## 2016-08-30 DIAGNOSIS — D631 Anemia in chronic kidney disease: Secondary | ICD-10-CM | POA: Diagnosis not present

## 2016-08-30 DIAGNOSIS — N186 End stage renal disease: Secondary | ICD-10-CM | POA: Diagnosis not present

## 2016-08-30 DIAGNOSIS — N2581 Secondary hyperparathyroidism of renal origin: Secondary | ICD-10-CM | POA: Diagnosis not present

## 2016-09-02 DIAGNOSIS — N186 End stage renal disease: Secondary | ICD-10-CM | POA: Diagnosis not present

## 2016-09-02 DIAGNOSIS — D631 Anemia in chronic kidney disease: Secondary | ICD-10-CM | POA: Diagnosis not present

## 2016-09-02 DIAGNOSIS — D509 Iron deficiency anemia, unspecified: Secondary | ICD-10-CM | POA: Diagnosis not present

## 2016-09-02 DIAGNOSIS — N2581 Secondary hyperparathyroidism of renal origin: Secondary | ICD-10-CM | POA: Diagnosis not present

## 2016-09-04 DIAGNOSIS — N186 End stage renal disease: Secondary | ICD-10-CM | POA: Diagnosis not present

## 2016-09-04 DIAGNOSIS — N2581 Secondary hyperparathyroidism of renal origin: Secondary | ICD-10-CM | POA: Diagnosis not present

## 2016-09-04 DIAGNOSIS — D631 Anemia in chronic kidney disease: Secondary | ICD-10-CM | POA: Diagnosis not present

## 2016-09-04 DIAGNOSIS — D509 Iron deficiency anemia, unspecified: Secondary | ICD-10-CM | POA: Diagnosis not present

## 2016-09-06 DIAGNOSIS — D509 Iron deficiency anemia, unspecified: Secondary | ICD-10-CM | POA: Diagnosis not present

## 2016-09-06 DIAGNOSIS — N186 End stage renal disease: Secondary | ICD-10-CM | POA: Diagnosis not present

## 2016-09-06 DIAGNOSIS — D631 Anemia in chronic kidney disease: Secondary | ICD-10-CM | POA: Diagnosis not present

## 2016-09-06 DIAGNOSIS — N2581 Secondary hyperparathyroidism of renal origin: Secondary | ICD-10-CM | POA: Diagnosis not present

## 2016-09-09 DIAGNOSIS — N2581 Secondary hyperparathyroidism of renal origin: Secondary | ICD-10-CM | POA: Diagnosis not present

## 2016-09-09 DIAGNOSIS — D509 Iron deficiency anemia, unspecified: Secondary | ICD-10-CM | POA: Diagnosis not present

## 2016-09-09 DIAGNOSIS — N186 End stage renal disease: Secondary | ICD-10-CM | POA: Diagnosis not present

## 2016-09-09 DIAGNOSIS — D631 Anemia in chronic kidney disease: Secondary | ICD-10-CM | POA: Diagnosis not present

## 2016-09-11 DIAGNOSIS — N2581 Secondary hyperparathyroidism of renal origin: Secondary | ICD-10-CM | POA: Diagnosis not present

## 2016-09-11 DIAGNOSIS — D509 Iron deficiency anemia, unspecified: Secondary | ICD-10-CM | POA: Diagnosis not present

## 2016-09-11 DIAGNOSIS — N186 End stage renal disease: Secondary | ICD-10-CM | POA: Diagnosis not present

## 2016-09-11 DIAGNOSIS — D631 Anemia in chronic kidney disease: Secondary | ICD-10-CM | POA: Diagnosis not present

## 2016-09-13 DIAGNOSIS — T8612 Kidney transplant failure: Secondary | ICD-10-CM | POA: Diagnosis not present

## 2016-09-13 DIAGNOSIS — Z992 Dependence on renal dialysis: Secondary | ICD-10-CM | POA: Diagnosis not present

## 2016-09-13 DIAGNOSIS — D631 Anemia in chronic kidney disease: Secondary | ICD-10-CM | POA: Diagnosis not present

## 2016-09-13 DIAGNOSIS — N186 End stage renal disease: Secondary | ICD-10-CM | POA: Diagnosis not present

## 2016-09-13 DIAGNOSIS — D509 Iron deficiency anemia, unspecified: Secondary | ICD-10-CM | POA: Diagnosis not present

## 2016-09-13 DIAGNOSIS — N2581 Secondary hyperparathyroidism of renal origin: Secondary | ICD-10-CM | POA: Diagnosis not present

## 2016-09-16 DIAGNOSIS — N186 End stage renal disease: Secondary | ICD-10-CM | POA: Diagnosis not present

## 2016-09-16 DIAGNOSIS — D509 Iron deficiency anemia, unspecified: Secondary | ICD-10-CM | POA: Diagnosis not present

## 2016-09-16 DIAGNOSIS — D631 Anemia in chronic kidney disease: Secondary | ICD-10-CM | POA: Diagnosis not present

## 2016-09-16 DIAGNOSIS — N2581 Secondary hyperparathyroidism of renal origin: Secondary | ICD-10-CM | POA: Diagnosis not present

## 2016-09-18 DIAGNOSIS — D509 Iron deficiency anemia, unspecified: Secondary | ICD-10-CM | POA: Diagnosis not present

## 2016-09-18 DIAGNOSIS — D631 Anemia in chronic kidney disease: Secondary | ICD-10-CM | POA: Diagnosis not present

## 2016-09-18 DIAGNOSIS — N186 End stage renal disease: Secondary | ICD-10-CM | POA: Diagnosis not present

## 2016-09-18 DIAGNOSIS — N2581 Secondary hyperparathyroidism of renal origin: Secondary | ICD-10-CM | POA: Diagnosis not present

## 2016-09-20 DIAGNOSIS — D509 Iron deficiency anemia, unspecified: Secondary | ICD-10-CM | POA: Diagnosis not present

## 2016-09-20 DIAGNOSIS — D631 Anemia in chronic kidney disease: Secondary | ICD-10-CM | POA: Diagnosis not present

## 2016-09-20 DIAGNOSIS — N186 End stage renal disease: Secondary | ICD-10-CM | POA: Diagnosis not present

## 2016-09-20 DIAGNOSIS — N2581 Secondary hyperparathyroidism of renal origin: Secondary | ICD-10-CM | POA: Diagnosis not present

## 2016-09-23 DIAGNOSIS — D631 Anemia in chronic kidney disease: Secondary | ICD-10-CM | POA: Diagnosis not present

## 2016-09-23 DIAGNOSIS — N186 End stage renal disease: Secondary | ICD-10-CM | POA: Diagnosis not present

## 2016-09-23 DIAGNOSIS — D509 Iron deficiency anemia, unspecified: Secondary | ICD-10-CM | POA: Diagnosis not present

## 2016-09-23 DIAGNOSIS — N2581 Secondary hyperparathyroidism of renal origin: Secondary | ICD-10-CM | POA: Diagnosis not present

## 2016-09-25 DIAGNOSIS — D509 Iron deficiency anemia, unspecified: Secondary | ICD-10-CM | POA: Diagnosis not present

## 2016-09-25 DIAGNOSIS — D631 Anemia in chronic kidney disease: Secondary | ICD-10-CM | POA: Diagnosis not present

## 2016-09-25 DIAGNOSIS — N186 End stage renal disease: Secondary | ICD-10-CM | POA: Diagnosis not present

## 2016-09-25 DIAGNOSIS — N2581 Secondary hyperparathyroidism of renal origin: Secondary | ICD-10-CM | POA: Diagnosis not present

## 2016-09-27 DIAGNOSIS — N186 End stage renal disease: Secondary | ICD-10-CM | POA: Diagnosis not present

## 2016-09-27 DIAGNOSIS — D631 Anemia in chronic kidney disease: Secondary | ICD-10-CM | POA: Diagnosis not present

## 2016-09-27 DIAGNOSIS — N2581 Secondary hyperparathyroidism of renal origin: Secondary | ICD-10-CM | POA: Diagnosis not present

## 2016-09-27 DIAGNOSIS — D509 Iron deficiency anemia, unspecified: Secondary | ICD-10-CM | POA: Diagnosis not present

## 2016-09-30 DIAGNOSIS — N2581 Secondary hyperparathyroidism of renal origin: Secondary | ICD-10-CM | POA: Diagnosis not present

## 2016-09-30 DIAGNOSIS — D509 Iron deficiency anemia, unspecified: Secondary | ICD-10-CM | POA: Diagnosis not present

## 2016-09-30 DIAGNOSIS — N186 End stage renal disease: Secondary | ICD-10-CM | POA: Diagnosis not present

## 2016-09-30 DIAGNOSIS — D631 Anemia in chronic kidney disease: Secondary | ICD-10-CM | POA: Diagnosis not present

## 2016-10-02 DIAGNOSIS — D631 Anemia in chronic kidney disease: Secondary | ICD-10-CM | POA: Diagnosis not present

## 2016-10-02 DIAGNOSIS — N2581 Secondary hyperparathyroidism of renal origin: Secondary | ICD-10-CM | POA: Diagnosis not present

## 2016-10-02 DIAGNOSIS — N186 End stage renal disease: Secondary | ICD-10-CM | POA: Diagnosis not present

## 2016-10-02 DIAGNOSIS — D509 Iron deficiency anemia, unspecified: Secondary | ICD-10-CM | POA: Diagnosis not present

## 2016-10-04 DIAGNOSIS — N186 End stage renal disease: Secondary | ICD-10-CM | POA: Diagnosis not present

## 2016-10-04 DIAGNOSIS — D509 Iron deficiency anemia, unspecified: Secondary | ICD-10-CM | POA: Diagnosis not present

## 2016-10-04 DIAGNOSIS — N2581 Secondary hyperparathyroidism of renal origin: Secondary | ICD-10-CM | POA: Diagnosis not present

## 2016-10-04 DIAGNOSIS — D631 Anemia in chronic kidney disease: Secondary | ICD-10-CM | POA: Diagnosis not present

## 2016-10-07 DIAGNOSIS — D509 Iron deficiency anemia, unspecified: Secondary | ICD-10-CM | POA: Diagnosis not present

## 2016-10-07 DIAGNOSIS — N186 End stage renal disease: Secondary | ICD-10-CM | POA: Diagnosis not present

## 2016-10-07 DIAGNOSIS — D631 Anemia in chronic kidney disease: Secondary | ICD-10-CM | POA: Diagnosis not present

## 2016-10-07 DIAGNOSIS — N2581 Secondary hyperparathyroidism of renal origin: Secondary | ICD-10-CM | POA: Diagnosis not present

## 2016-10-09 DIAGNOSIS — N2581 Secondary hyperparathyroidism of renal origin: Secondary | ICD-10-CM | POA: Diagnosis not present

## 2016-10-09 DIAGNOSIS — N186 End stage renal disease: Secondary | ICD-10-CM | POA: Diagnosis not present

## 2016-10-09 DIAGNOSIS — D509 Iron deficiency anemia, unspecified: Secondary | ICD-10-CM | POA: Diagnosis not present

## 2016-10-09 DIAGNOSIS — D631 Anemia in chronic kidney disease: Secondary | ICD-10-CM | POA: Diagnosis not present

## 2016-10-11 DIAGNOSIS — D509 Iron deficiency anemia, unspecified: Secondary | ICD-10-CM | POA: Diagnosis not present

## 2016-10-11 DIAGNOSIS — N186 End stage renal disease: Secondary | ICD-10-CM | POA: Diagnosis not present

## 2016-10-11 DIAGNOSIS — D631 Anemia in chronic kidney disease: Secondary | ICD-10-CM | POA: Diagnosis not present

## 2016-10-11 DIAGNOSIS — N2581 Secondary hyperparathyroidism of renal origin: Secondary | ICD-10-CM | POA: Diagnosis not present

## 2016-10-13 DIAGNOSIS — Z992 Dependence on renal dialysis: Secondary | ICD-10-CM | POA: Diagnosis not present

## 2016-10-13 DIAGNOSIS — T8612 Kidney transplant failure: Secondary | ICD-10-CM | POA: Diagnosis not present

## 2016-10-13 DIAGNOSIS — N186 End stage renal disease: Secondary | ICD-10-CM | POA: Diagnosis not present

## 2016-10-14 DIAGNOSIS — D509 Iron deficiency anemia, unspecified: Secondary | ICD-10-CM | POA: Diagnosis not present

## 2016-10-14 DIAGNOSIS — D631 Anemia in chronic kidney disease: Secondary | ICD-10-CM | POA: Diagnosis not present

## 2016-10-14 DIAGNOSIS — N186 End stage renal disease: Secondary | ICD-10-CM | POA: Diagnosis not present

## 2016-10-14 DIAGNOSIS — N2581 Secondary hyperparathyroidism of renal origin: Secondary | ICD-10-CM | POA: Diagnosis not present

## 2016-10-16 DIAGNOSIS — D631 Anemia in chronic kidney disease: Secondary | ICD-10-CM | POA: Diagnosis not present

## 2016-10-16 DIAGNOSIS — D509 Iron deficiency anemia, unspecified: Secondary | ICD-10-CM | POA: Diagnosis not present

## 2016-10-16 DIAGNOSIS — N2581 Secondary hyperparathyroidism of renal origin: Secondary | ICD-10-CM | POA: Diagnosis not present

## 2016-10-16 DIAGNOSIS — N186 End stage renal disease: Secondary | ICD-10-CM | POA: Diagnosis not present

## 2016-10-18 DIAGNOSIS — D509 Iron deficiency anemia, unspecified: Secondary | ICD-10-CM | POA: Diagnosis not present

## 2016-10-18 DIAGNOSIS — N186 End stage renal disease: Secondary | ICD-10-CM | POA: Diagnosis not present

## 2016-10-18 DIAGNOSIS — D631 Anemia in chronic kidney disease: Secondary | ICD-10-CM | POA: Diagnosis not present

## 2016-10-18 DIAGNOSIS — N2581 Secondary hyperparathyroidism of renal origin: Secondary | ICD-10-CM | POA: Diagnosis not present

## 2016-10-21 DIAGNOSIS — N186 End stage renal disease: Secondary | ICD-10-CM | POA: Diagnosis not present

## 2016-10-21 DIAGNOSIS — D509 Iron deficiency anemia, unspecified: Secondary | ICD-10-CM | POA: Diagnosis not present

## 2016-10-21 DIAGNOSIS — D631 Anemia in chronic kidney disease: Secondary | ICD-10-CM | POA: Diagnosis not present

## 2016-10-21 DIAGNOSIS — N2581 Secondary hyperparathyroidism of renal origin: Secondary | ICD-10-CM | POA: Diagnosis not present

## 2016-10-23 DIAGNOSIS — D509 Iron deficiency anemia, unspecified: Secondary | ICD-10-CM | POA: Diagnosis not present

## 2016-10-23 DIAGNOSIS — N2581 Secondary hyperparathyroidism of renal origin: Secondary | ICD-10-CM | POA: Diagnosis not present

## 2016-10-23 DIAGNOSIS — D631 Anemia in chronic kidney disease: Secondary | ICD-10-CM | POA: Diagnosis not present

## 2016-10-23 DIAGNOSIS — N186 End stage renal disease: Secondary | ICD-10-CM | POA: Diagnosis not present

## 2016-10-25 DIAGNOSIS — N186 End stage renal disease: Secondary | ICD-10-CM | POA: Diagnosis not present

## 2016-10-25 DIAGNOSIS — D631 Anemia in chronic kidney disease: Secondary | ICD-10-CM | POA: Diagnosis not present

## 2016-10-25 DIAGNOSIS — N2581 Secondary hyperparathyroidism of renal origin: Secondary | ICD-10-CM | POA: Diagnosis not present

## 2016-10-25 DIAGNOSIS — D509 Iron deficiency anemia, unspecified: Secondary | ICD-10-CM | POA: Diagnosis not present

## 2016-10-28 DIAGNOSIS — D631 Anemia in chronic kidney disease: Secondary | ICD-10-CM | POA: Diagnosis not present

## 2016-10-28 DIAGNOSIS — D509 Iron deficiency anemia, unspecified: Secondary | ICD-10-CM | POA: Diagnosis not present

## 2016-10-28 DIAGNOSIS — N186 End stage renal disease: Secondary | ICD-10-CM | POA: Diagnosis not present

## 2016-10-28 DIAGNOSIS — N2581 Secondary hyperparathyroidism of renal origin: Secondary | ICD-10-CM | POA: Diagnosis not present

## 2016-10-30 DIAGNOSIS — N2581 Secondary hyperparathyroidism of renal origin: Secondary | ICD-10-CM | POA: Diagnosis not present

## 2016-10-30 DIAGNOSIS — D631 Anemia in chronic kidney disease: Secondary | ICD-10-CM | POA: Diagnosis not present

## 2016-10-30 DIAGNOSIS — D509 Iron deficiency anemia, unspecified: Secondary | ICD-10-CM | POA: Diagnosis not present

## 2016-10-30 DIAGNOSIS — N186 End stage renal disease: Secondary | ICD-10-CM | POA: Diagnosis not present

## 2016-11-01 DIAGNOSIS — N186 End stage renal disease: Secondary | ICD-10-CM | POA: Diagnosis not present

## 2016-11-01 DIAGNOSIS — N2581 Secondary hyperparathyroidism of renal origin: Secondary | ICD-10-CM | POA: Diagnosis not present

## 2016-11-01 DIAGNOSIS — D509 Iron deficiency anemia, unspecified: Secondary | ICD-10-CM | POA: Diagnosis not present

## 2016-11-01 DIAGNOSIS — D631 Anemia in chronic kidney disease: Secondary | ICD-10-CM | POA: Diagnosis not present

## 2016-11-04 DIAGNOSIS — D631 Anemia in chronic kidney disease: Secondary | ICD-10-CM | POA: Diagnosis not present

## 2016-11-04 DIAGNOSIS — N2581 Secondary hyperparathyroidism of renal origin: Secondary | ICD-10-CM | POA: Diagnosis not present

## 2016-11-04 DIAGNOSIS — N186 End stage renal disease: Secondary | ICD-10-CM | POA: Diagnosis not present

## 2016-11-04 DIAGNOSIS — D509 Iron deficiency anemia, unspecified: Secondary | ICD-10-CM | POA: Diagnosis not present

## 2016-11-06 DIAGNOSIS — D631 Anemia in chronic kidney disease: Secondary | ICD-10-CM | POA: Diagnosis not present

## 2016-11-06 DIAGNOSIS — N2581 Secondary hyperparathyroidism of renal origin: Secondary | ICD-10-CM | POA: Diagnosis not present

## 2016-11-06 DIAGNOSIS — N186 End stage renal disease: Secondary | ICD-10-CM | POA: Diagnosis not present

## 2016-11-06 DIAGNOSIS — D509 Iron deficiency anemia, unspecified: Secondary | ICD-10-CM | POA: Diagnosis not present

## 2016-11-08 DIAGNOSIS — D509 Iron deficiency anemia, unspecified: Secondary | ICD-10-CM | POA: Diagnosis not present

## 2016-11-08 DIAGNOSIS — N186 End stage renal disease: Secondary | ICD-10-CM | POA: Diagnosis not present

## 2016-11-08 DIAGNOSIS — D631 Anemia in chronic kidney disease: Secondary | ICD-10-CM | POA: Diagnosis not present

## 2016-11-08 DIAGNOSIS — N2581 Secondary hyperparathyroidism of renal origin: Secondary | ICD-10-CM | POA: Diagnosis not present

## 2016-11-11 DIAGNOSIS — D631 Anemia in chronic kidney disease: Secondary | ICD-10-CM | POA: Diagnosis not present

## 2016-11-11 DIAGNOSIS — D509 Iron deficiency anemia, unspecified: Secondary | ICD-10-CM | POA: Diagnosis not present

## 2016-11-11 DIAGNOSIS — N2581 Secondary hyperparathyroidism of renal origin: Secondary | ICD-10-CM | POA: Diagnosis not present

## 2016-11-11 DIAGNOSIS — N186 End stage renal disease: Secondary | ICD-10-CM | POA: Diagnosis not present

## 2016-11-13 DIAGNOSIS — Z992 Dependence on renal dialysis: Secondary | ICD-10-CM | POA: Diagnosis not present

## 2016-11-13 DIAGNOSIS — D509 Iron deficiency anemia, unspecified: Secondary | ICD-10-CM | POA: Diagnosis not present

## 2016-11-13 DIAGNOSIS — T8612 Kidney transplant failure: Secondary | ICD-10-CM | POA: Diagnosis not present

## 2016-11-13 DIAGNOSIS — D631 Anemia in chronic kidney disease: Secondary | ICD-10-CM | POA: Diagnosis not present

## 2016-11-13 DIAGNOSIS — N186 End stage renal disease: Secondary | ICD-10-CM | POA: Diagnosis not present

## 2016-11-13 DIAGNOSIS — N2581 Secondary hyperparathyroidism of renal origin: Secondary | ICD-10-CM | POA: Diagnosis not present

## 2016-11-15 DIAGNOSIS — D509 Iron deficiency anemia, unspecified: Secondary | ICD-10-CM | POA: Diagnosis not present

## 2016-11-15 DIAGNOSIS — D631 Anemia in chronic kidney disease: Secondary | ICD-10-CM | POA: Diagnosis not present

## 2016-11-15 DIAGNOSIS — N2581 Secondary hyperparathyroidism of renal origin: Secondary | ICD-10-CM | POA: Diagnosis not present

## 2016-11-15 DIAGNOSIS — N186 End stage renal disease: Secondary | ICD-10-CM | POA: Diagnosis not present

## 2016-11-18 DIAGNOSIS — D631 Anemia in chronic kidney disease: Secondary | ICD-10-CM | POA: Diagnosis not present

## 2016-11-18 DIAGNOSIS — N2581 Secondary hyperparathyroidism of renal origin: Secondary | ICD-10-CM | POA: Diagnosis not present

## 2016-11-18 DIAGNOSIS — N186 End stage renal disease: Secondary | ICD-10-CM | POA: Diagnosis not present

## 2016-11-18 DIAGNOSIS — D509 Iron deficiency anemia, unspecified: Secondary | ICD-10-CM | POA: Diagnosis not present

## 2016-11-20 ENCOUNTER — Ambulatory Visit: Payer: Medicaid Other | Admitting: Podiatry

## 2016-11-20 DIAGNOSIS — N186 End stage renal disease: Secondary | ICD-10-CM | POA: Diagnosis not present

## 2016-11-20 DIAGNOSIS — D631 Anemia in chronic kidney disease: Secondary | ICD-10-CM | POA: Diagnosis not present

## 2016-11-20 DIAGNOSIS — F321 Major depressive disorder, single episode, moderate: Secondary | ICD-10-CM | POA: Diagnosis not present

## 2016-11-20 DIAGNOSIS — D509 Iron deficiency anemia, unspecified: Secondary | ICD-10-CM | POA: Diagnosis not present

## 2016-11-20 DIAGNOSIS — N2581 Secondary hyperparathyroidism of renal origin: Secondary | ICD-10-CM | POA: Diagnosis not present

## 2016-11-22 DIAGNOSIS — N2581 Secondary hyperparathyroidism of renal origin: Secondary | ICD-10-CM | POA: Diagnosis not present

## 2016-11-22 DIAGNOSIS — D631 Anemia in chronic kidney disease: Secondary | ICD-10-CM | POA: Diagnosis not present

## 2016-11-22 DIAGNOSIS — N186 End stage renal disease: Secondary | ICD-10-CM | POA: Diagnosis not present

## 2016-11-22 DIAGNOSIS — D509 Iron deficiency anemia, unspecified: Secondary | ICD-10-CM | POA: Diagnosis not present

## 2016-11-25 DIAGNOSIS — N186 End stage renal disease: Secondary | ICD-10-CM | POA: Diagnosis not present

## 2016-11-25 DIAGNOSIS — F321 Major depressive disorder, single episode, moderate: Secondary | ICD-10-CM | POA: Diagnosis not present

## 2016-11-25 DIAGNOSIS — N2581 Secondary hyperparathyroidism of renal origin: Secondary | ICD-10-CM | POA: Diagnosis not present

## 2016-11-25 DIAGNOSIS — D509 Iron deficiency anemia, unspecified: Secondary | ICD-10-CM | POA: Diagnosis not present

## 2016-11-25 DIAGNOSIS — D631 Anemia in chronic kidney disease: Secondary | ICD-10-CM | POA: Diagnosis not present

## 2016-11-27 DIAGNOSIS — N2581 Secondary hyperparathyroidism of renal origin: Secondary | ICD-10-CM | POA: Diagnosis not present

## 2016-11-27 DIAGNOSIS — N186 End stage renal disease: Secondary | ICD-10-CM | POA: Diagnosis not present

## 2016-11-27 DIAGNOSIS — D509 Iron deficiency anemia, unspecified: Secondary | ICD-10-CM | POA: Diagnosis not present

## 2016-11-27 DIAGNOSIS — D631 Anemia in chronic kidney disease: Secondary | ICD-10-CM | POA: Diagnosis not present

## 2016-11-29 DIAGNOSIS — D631 Anemia in chronic kidney disease: Secondary | ICD-10-CM | POA: Diagnosis not present

## 2016-11-29 DIAGNOSIS — N2581 Secondary hyperparathyroidism of renal origin: Secondary | ICD-10-CM | POA: Diagnosis not present

## 2016-11-29 DIAGNOSIS — D509 Iron deficiency anemia, unspecified: Secondary | ICD-10-CM | POA: Diagnosis not present

## 2016-11-29 DIAGNOSIS — N186 End stage renal disease: Secondary | ICD-10-CM | POA: Diagnosis not present

## 2016-11-30 ENCOUNTER — Emergency Department (HOSPITAL_COMMUNITY)
Admission: EM | Admit: 2016-11-30 | Discharge: 2016-11-30 | Disposition: A | Discharge status: Home / Self Care | Payer: Medicaid Other | Attending: Emergency Medicine | Admitting: Emergency Medicine

## 2016-11-30 ENCOUNTER — Encounter (HOSPITAL_COMMUNITY): Payer: Self-pay | Admitting: Emergency Medicine

## 2016-11-30 ENCOUNTER — Emergency Department (HOSPITAL_COMMUNITY): Payer: Medicaid Other

## 2016-11-30 DIAGNOSIS — M25532 Pain in left wrist: Secondary | ICD-10-CM

## 2016-11-30 DIAGNOSIS — Z79899 Other long term (current) drug therapy: Secondary | ICD-10-CM | POA: Diagnosis not present

## 2016-11-30 DIAGNOSIS — Z87891 Personal history of nicotine dependence: Secondary | ICD-10-CM | POA: Insufficient documentation

## 2016-11-30 DIAGNOSIS — I120 Hypertensive chronic kidney disease with stage 5 chronic kidney disease or end stage renal disease: Secondary | ICD-10-CM | POA: Insufficient documentation

## 2016-11-30 DIAGNOSIS — N186 End stage renal disease: Secondary | ICD-10-CM | POA: Insufficient documentation

## 2016-11-30 LAB — BASIC METABOLIC PANEL
ANION GAP: 13 (ref 5–15)
BUN: 26 mg/dL — ABNORMAL HIGH (ref 6–20)
CHLORIDE: 95 mmol/L — AB (ref 101–111)
CO2: 28 mmol/L (ref 22–32)
Calcium: 9.4 mg/dL (ref 8.9–10.3)
Creatinine, Ser: 10.52 mg/dL — ABNORMAL HIGH (ref 0.61–1.24)
GFR calc Af Amer: 7 mL/min — ABNORMAL LOW (ref >60)
GFR, EST NON AFRICAN AMERICAN: 6 mL/min — AB (ref >60)
GLUCOSE: 107 mg/dL — AB (ref 65–99)
POTASSIUM: 3.7 mmol/L (ref 3.5–5.1)
Sodium: 136 mmol/L (ref 135–145)

## 2016-11-30 LAB — CBC WITH DIFFERENTIAL/PLATELET
BASOS ABS: 0 10*3/uL (ref 0.0–0.1)
Basophils Relative: 0 %
Eosinophils Absolute: 0.2 10*3/uL (ref 0.0–0.7)
Eosinophils Relative: 2 %
HEMATOCRIT: 32.8 % — AB (ref 39.0–52.0)
HEMOGLOBIN: 10.8 g/dL — AB (ref 13.0–17.0)
Lymphocytes Relative: 17 %
Lymphs Abs: 1.3 10*3/uL (ref 0.7–4.0)
MCH: 27.1 pg (ref 26.0–34.0)
MCHC: 32.9 g/dL (ref 30.0–36.0)
MCV: 82.2 fL (ref 78.0–100.0)
Monocytes Absolute: 0.4 10*3/uL (ref 0.1–1.0)
Monocytes Relative: 5 %
NEUTROS ABS: 5.9 10*3/uL (ref 1.7–7.7)
NEUTROS PCT: 76 %
PLATELETS: 187 10*3/uL (ref 150–400)
RBC: 3.99 MIL/uL — AB (ref 4.22–5.81)
RDW: 19.4 % — ABNORMAL HIGH (ref 11.5–15.5)
WBC: 7.8 10*3/uL (ref 4.0–10.5)

## 2016-11-30 MED ORDER — IBUPROFEN 800 MG PO TABS
800.0000 mg | ORAL_TABLET | Freq: Once | ORAL | Status: AC
  Administered 2016-11-30: 800 mg via ORAL
  Filled 2016-11-30: qty 1

## 2016-11-30 MED ORDER — OXYCODONE-ACETAMINOPHEN 5-325 MG PO TABS
1.0000 | ORAL_TABLET | Freq: Once | ORAL | Status: AC
  Administered 2016-11-30: 1 via ORAL
  Filled 2016-11-30: qty 1

## 2016-11-30 NOTE — Discharge Instructions (Signed)
Please use splint Take ibuprofen for pain Call Dr. Angus Palms office tomorrow for recheck Return if worsening pain, swelling, or cold.

## 2016-11-30 NOTE — ED Provider Notes (Signed)
[facility] DEPT Provider Note   CSN: [id number] Arrival date & time: 11/30/16  1351     History   Chief Complaint Chief Complaint  Patient presents with  . Wrist Pain  . Arm Pain    HPI [name] is a 31 y.o. male.  HPI  A 31 year old male on dialysis Tuesday Thursday Saturday presents today complaining of left wrist pain. He has a fistula in the left forearm. The pain is just distal to the fistula. It began spontaneously last night. He has had some coolness to the fingers on that side. He was complaining of severe sharp pain in the lateral aspect of the wrist. He has not had any similar episodes in the past. He states it began after he got off work last night and has worsened. He has not taken any medication for this. He has noted some swelling of the thenar eminence.  Past Medical History:  Diagnosis Date  . Alport syndrome   . Alport's syndrome    EFFECT KIDNEYS+ HEARING  . Anemia   . Blood dyscrasia   . GERD (gastroesophageal reflux disease)   . GERD (gastroesophageal reflux disease)   . Headache   . Heart murmur    ASYMPTOMATIC  . Kidney failure as a baby   went on dialysis age 65      T62  . Sickle cell trait [facility])     Patient Active Problem List   Diagnosis Date Noted  . Secondary hyperparathyroidism of renal origin ([place]) 12/03/2015  . Hyperparathyroidism, secondary ([place]) 12/02/2015  . ESRD on dialysis ([place]) 12/02/2015  . Acute dyspnea 04/25/2014  . Flash pulmonary edema ([place]) 04/25/2014  . H/O kidney transplant 11/12/2007  . Alport syndrome 06/29/1995  . Essential (primary) hypertension 06/19/1995    Past Surgical History:  Procedure Laterality Date  . AV FISTULA PLACEMENT     LEFT ARM  . KIDNEY TRANSPLANT    . KIDNEY TRANSPLANT    . NEPHRECTOMY TRANSPLANTED ORGAN    . PARATHYROIDECTOMY N/A 12/03/2015   Procedure: TOTAL PARATHYROIDECTOMY WITH  AUTOTRANSPLANT TO RIGHT FOREARM ;  Surgeon: Armandina Gemma, MD;  Location: [facility];  Service:  General;  Laterality: N/A;  . transplant kidney removed Right 03/20/2014       Home Medications    Prior to Admission medications   Medication Sig Start Date End Date Taking? Authorizing Provider  acetaminophen (TYLENOL) 325 MG tablet Take 325 mg by mouth daily as needed.    [provider]  calcitRIOL (ROCALTROL) 0.25 MCG capsule Take 0.5 mcg by mouth daily.     [provider]  diphenhydrAMINE (SOMINEX) 25 MG tablet Take 25 mg by mouth at bedtime as needed for itching or sleep.    [provider]  epoetin alfa (EPOGEN,PROCRIT) 2000 UNIT/ML injection Inject 4,000 Units into the skin 3 (three) times a week. 12/02/13   [provider]  lisinopril (PRINIVIL,ZESTRIL) 10 MG tablet Take 20 mg by mouth at bedtime.  11/04/15   [provider]  multivitamin (RENA-VIT) TABS tablet Take 1 tablet by mouth at bedtime. 04/27/14   Dellia Nims, MD  sevelamer carbonate (RENVELA) 800 MG tablet Take 3,200 mg by mouth 3 (three) times daily with meals.    [provider]    Family History Family History  Problem Relation Age of Onset  . Cancer Mother     Social History Social History  Substance Use Topics  . Smoking status: Former Smoker    Types: Cigarettes  Quit date: 12/04/2012  . Smokeless tobacco: Former Systems developer  . Alcohol use No     Allergies   Patient has no known allergies.   Review of Systems Review of Systems  All other systems reviewed and are negative.    Physical Exam Updated Vital Signs BP (!) 171/109 (BP Location: Right Arm)   Pulse 89   Temp 98.3 F (36.8 C) (Oral)   Resp 20   Ht 1.753 m ($Remove'5\' 9"'uXDqtfr$ )   Wt 63.5 kg (140 lb)   SpO2 100%   BMI 20.67 kg/m   Physical Exam  Constitutional: He appears well-developed and well-nourished.  HENT:  Head: Normocephalic and atraumatic.  Eyes: EOM are normal. Pupils are equal, round, and reactive to light.  Neck: Normal range of motion. Neck supple.  Cardiovascular: Normal  rate.   Musculoskeletal:       Left wrist: He exhibits tenderness and swelling.       Arms: There is a good thrill over the AV fistula. There is no erythema, swelling, or tenderness to palpation of the AV fistula. The wrist is tender over the lateral aspect. He has discomfort with movement of his thumb although he has a full active range of motion movement including ABduction and adduction. Fingers are cool , radial pulse intact  Nursing note and vitals reviewed.    ED Treatments / Results  Labs (all labs ordered are listed, but only abnormal results are displayed) Labs Reviewed  CBC WITH DIFFERENTIAL/PLATELET  BASIC METABOLIC PANEL    EKG  EKG Interpretation None       Radiology Dg Wrist Complete Left  Result Date: 11/30/2016 CLINICAL DATA:  Left wrist pain. EXAM: LEFT WRIST - COMPLETE 3+ VIEW COMPARISON:  None. 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 No acute fracture or subluxation identified. No radio-opaque foreign bodies identified. IMPRESSION: 1. No acute bone abnormality. Electronically Signed   By: Kerby Moors M.D.   On: 11/30/2016 15:14    Procedures Procedures (including critical care time)  Medications Ordered in ED Medications  ibuprofen (ADVIL,MOTRIN) tablet 800 mg (not administered)  oxyCODONE-acetaminophen (PERCOCET/ROXICET) 5-325 MG per tablet 1 tablet (not administered)     Initial Impression / Assessment and Plan / ED Course  I have reviewed the triage vital signs and the nursing notes.  Pertinent labs & imaging results that were available during my care of the patient were reviewed by me and considered in my medical decision making (see chart for details).     31 year old male with left wrist pain which is just distal to his fistula. Pulses are intact distal to the fistula. There is a good thrill over the fistula. There are no signs of infection of fistula or the wrist. There is no erythema or warmth. There is tenderness and swelling. I  consulted with Dr. Tawni Millers who also saw and evaluated the patient. X-Johnwesley Lederman showed no evidence of acute fracture. Patient is placed in a splint and advised follow-up with hand surgery. He is advised of return precautions and voices understanding.  Final Clinical Impressions(s) / ED Diagnoses   Final diagnoses:  Left wrist pain    New Prescriptions Discharge Medication List as of 11/30/2016  4:25 PM       Pattricia Boss, MD 11/30/16 2045

## 2016-11-30 NOTE — ED Notes (Signed)
Declined W/C at D/C and was escorted to lobby by RN. 

## 2016-11-30 NOTE — ED Triage Notes (Signed)
Pt. Is on dialysis . His fistula is in the same place where the wrist pain is. No problem with dialysis yesterday.

## 2016-11-30 NOTE — Consult Note (Signed)
[facility]  Patient name: [name] MRN: [id number] DOB: 10/21/1985 Sex: male  REASON FOR CONSULT: Left hand and wrist pain  HPI: [name] is a 31 y.o. male, who is status post left radiocephalic fistula by myself and 2004. He has had outstanding use of this fistula since that time. He did have a transplant for 6 years and is now back on hemodialysis. He is quite active and works full-time in [place]. He reports that yesterday he was having some soreness in his wrist bilaterally. This became more significant through the night and presented to the emergency department today for further evaluation. He does have sensation that this is somewhat cool on the left versus the right. No pain specifically in his fingers. Discomfort in the wrist.  Past Medical History:  Diagnosis Date  . Alport syndrome   . Alport's syndrome    EFFECT KIDNEYS+ HEARING  . Anemia   . Blood dyscrasia   . GERD (gastroesophageal reflux disease)   . GERD (gastroesophageal reflux disease)   . Headache   . Heart murmur    ASYMPTOMATIC  . Kidney failure as a baby   went on dialysis age 68      T53  . Sickle cell trait ([facility])     Family History  Problem Relation Age of Onset  . Cancer Mother     SOCIAL HISTORY: Social History   Social History  . Marital status: Single    Spouse name: N/A  . Number of children: N/A  . Years of education: N/A   Occupational History  . Not on file.   Social History Main Topics  . Smoking status: Former Smoker    Types: Cigarettes    Quit date: 12/04/2012  . Smokeless tobacco: Former Systems developer  . Alcohol use No  . Drug use: No  . Sexual activity: Not on file   Other Topics Concern  . Not on file   Social History Narrative  . No narrative on file    No Known Allergies  No current facility-administered medications for this encounter.    Current Outpatient Prescriptions  Medication  Sig Dispense Refill  . acetaminophen (TYLENOL) 325 MG tablet Take 325 mg by mouth daily as needed.    . calcitRIOL (ROCALTROL) 0.25 MCG capsule Take 0.5 mcg by mouth daily.     . diphenhydrAMINE (SOMINEX) 25 MG tablet Take 25 mg by mouth at bedtime as needed for itching or sleep.    [place] Kitchen epoetin alfa (EPOGEN,PROCRIT) 2000 UNIT/ML injection Inject 4,000 Units into the skin 3 (three) times a week.    [place] Kitchen lisinopril (PRINIVIL,ZESTRIL) 10 MG tablet Take 20 mg by mouth at bedtime.   0  . multivitamin (RENA-VIT) TABS tablet Take 1 tablet by mouth at bedtime. 90 tablet 1  . sevelamer carbonate (RENVELA) 800 MG tablet Take 3,200 mg by mouth 3 (three) times daily with meals.    Review of systems: Nothing to add other than past medical history  PHYSICAL EXAM: Vitals:   11/30/16 1405 11/30/16 1407  BP: (!) 171/109   Pulse: 89   Resp: 20   Temp: 98.3 F (36.8 C)   TempSrc: Oral   SpO2: 100%   Weight:  140 lb (63.5 kg)  Height:  $Remove'5\' 9"'LaFIAeo$  (1.753 m)    GENERAL: The patient is a well-nourished male, in no acute distress. The vital signs are documented above. CARDIOVASCULAR: Easily palpable radial and ulnar pulse on the left.  Excellent maturation fistula throughout the forearm with distention into both the basilic and cephalic veins in the upper arm. No evidence of venous thrombosis in collateral branches. PULMONARY: There is good air exchange  ABDOMEN: Soft and non-tender  MUSCULOSKELETAL: There are no major deformities or cyanosis. NEUROLOGIC: No focal weakness or paresthesias are detected. SKIN: There are no ulcers or rashes noted. PSYCHIATRIC: The patient has a normal affect.  DATA:  I listened with hand-held Doppler. He does have an intact palmar arch in this is not affected by occlusion of his fistula. Does have normal flow at the radial artery below his AV fistula anastomosis and normal flow in the ulnar artery as well.  MEDICAL ISSUES: Unclear as to the etiology of his wrist discomfort. Do not  see any evidence of venous thrombosis or arterial thrombosis or any evidence of ischemia. He was reassured. Discussed with Dr. Jeanell Sparrow who will make appropriate referral to hand specialist   Rosetta Posner, MD [facility] Tel [phone] Pager [phone]

## 2016-11-30 NOTE — ED Triage Notes (Signed)
Pt. Stated, I started having left wrist pain last night and its gotten worse , it goes all the way to my shoulder. Denies any injury.

## 2016-12-02 DIAGNOSIS — F321 Major depressive disorder, single episode, moderate: Secondary | ICD-10-CM | POA: Diagnosis not present

## 2016-12-02 DIAGNOSIS — N186 End stage renal disease: Secondary | ICD-10-CM | POA: Diagnosis not present

## 2016-12-02 DIAGNOSIS — D631 Anemia in chronic kidney disease: Secondary | ICD-10-CM | POA: Diagnosis not present

## 2016-12-02 DIAGNOSIS — N2581 Secondary hyperparathyroidism of renal origin: Secondary | ICD-10-CM | POA: Diagnosis not present

## 2016-12-02 DIAGNOSIS — D509 Iron deficiency anemia, unspecified: Secondary | ICD-10-CM | POA: Diagnosis not present

## 2016-12-04 DIAGNOSIS — D509 Iron deficiency anemia, unspecified: Secondary | ICD-10-CM | POA: Diagnosis not present

## 2016-12-04 DIAGNOSIS — N186 End stage renal disease: Secondary | ICD-10-CM | POA: Diagnosis not present

## 2016-12-04 DIAGNOSIS — D631 Anemia in chronic kidney disease: Secondary | ICD-10-CM | POA: Diagnosis not present

## 2016-12-04 DIAGNOSIS — N2581 Secondary hyperparathyroidism of renal origin: Secondary | ICD-10-CM | POA: Diagnosis not present

## 2016-12-06 DIAGNOSIS — D509 Iron deficiency anemia, unspecified: Secondary | ICD-10-CM | POA: Diagnosis not present

## 2016-12-06 DIAGNOSIS — N186 End stage renal disease: Secondary | ICD-10-CM | POA: Diagnosis not present

## 2016-12-06 DIAGNOSIS — D631 Anemia in chronic kidney disease: Secondary | ICD-10-CM | POA: Diagnosis not present

## 2016-12-06 DIAGNOSIS — N2581 Secondary hyperparathyroidism of renal origin: Secondary | ICD-10-CM | POA: Diagnosis not present

## 2016-12-09 ENCOUNTER — Telehealth: Payer: Self-pay | Admitting: General Practice

## 2016-12-09 DIAGNOSIS — F321 Major depressive disorder, single episode, moderate: Secondary | ICD-10-CM | POA: Diagnosis not present

## 2016-12-09 DIAGNOSIS — N2581 Secondary hyperparathyroidism of renal origin: Secondary | ICD-10-CM | POA: Diagnosis not present

## 2016-12-09 DIAGNOSIS — N186 End stage renal disease: Secondary | ICD-10-CM | POA: Diagnosis not present

## 2016-12-09 DIAGNOSIS — D509 Iron deficiency anemia, unspecified: Secondary | ICD-10-CM | POA: Diagnosis not present

## 2016-12-09 DIAGNOSIS — D631 Anemia in chronic kidney disease: Secondary | ICD-10-CM | POA: Diagnosis not present

## 2016-12-09 NOTE — Telephone Encounter (Signed)
Called pt to discuss setting up NP appt w/PCP; Appt made for pt at [facility] for 12/30/16

## 2016-12-11 DIAGNOSIS — N2581 Secondary hyperparathyroidism of renal origin: Secondary | ICD-10-CM | POA: Diagnosis not present

## 2016-12-11 DIAGNOSIS — D509 Iron deficiency anemia, unspecified: Secondary | ICD-10-CM | POA: Diagnosis not present

## 2016-12-11 DIAGNOSIS — D631 Anemia in chronic kidney disease: Secondary | ICD-10-CM | POA: Diagnosis not present

## 2016-12-11 DIAGNOSIS — N186 End stage renal disease: Secondary | ICD-10-CM | POA: Diagnosis not present

## 2016-12-13 DIAGNOSIS — Z992 Dependence on renal dialysis: Secondary | ICD-10-CM | POA: Diagnosis not present

## 2016-12-13 DIAGNOSIS — T8612 Kidney transplant failure: Secondary | ICD-10-CM | POA: Diagnosis not present

## 2016-12-13 DIAGNOSIS — N186 End stage renal disease: Secondary | ICD-10-CM | POA: Diagnosis not present

## 2016-12-13 DIAGNOSIS — D509 Iron deficiency anemia, unspecified: Secondary | ICD-10-CM | POA: Diagnosis not present

## 2016-12-13 DIAGNOSIS — N2581 Secondary hyperparathyroidism of renal origin: Secondary | ICD-10-CM | POA: Diagnosis not present

## 2016-12-13 DIAGNOSIS — D631 Anemia in chronic kidney disease: Secondary | ICD-10-CM | POA: Diagnosis not present

## 2016-12-16 DIAGNOSIS — N2581 Secondary hyperparathyroidism of renal origin: Secondary | ICD-10-CM | POA: Diagnosis not present

## 2016-12-16 DIAGNOSIS — D509 Iron deficiency anemia, unspecified: Secondary | ICD-10-CM | POA: Diagnosis not present

## 2016-12-16 DIAGNOSIS — D631 Anemia in chronic kidney disease: Secondary | ICD-10-CM | POA: Diagnosis not present

## 2016-12-16 DIAGNOSIS — N186 End stage renal disease: Secondary | ICD-10-CM | POA: Diagnosis not present

## 2016-12-18 DIAGNOSIS — D631 Anemia in chronic kidney disease: Secondary | ICD-10-CM | POA: Diagnosis not present

## 2016-12-18 DIAGNOSIS — D509 Iron deficiency anemia, unspecified: Secondary | ICD-10-CM | POA: Diagnosis not present

## 2016-12-18 DIAGNOSIS — N2581 Secondary hyperparathyroidism of renal origin: Secondary | ICD-10-CM | POA: Diagnosis not present

## 2016-12-18 DIAGNOSIS — N186 End stage renal disease: Secondary | ICD-10-CM | POA: Diagnosis not present

## 2016-12-20 DIAGNOSIS — N186 End stage renal disease: Secondary | ICD-10-CM | POA: Diagnosis not present

## 2016-12-20 DIAGNOSIS — D509 Iron deficiency anemia, unspecified: Secondary | ICD-10-CM | POA: Diagnosis not present

## 2016-12-20 DIAGNOSIS — N2581 Secondary hyperparathyroidism of renal origin: Secondary | ICD-10-CM | POA: Diagnosis not present

## 2016-12-20 DIAGNOSIS — D631 Anemia in chronic kidney disease: Secondary | ICD-10-CM | POA: Diagnosis not present

## 2016-12-23 DIAGNOSIS — N186 End stage renal disease: Secondary | ICD-10-CM | POA: Diagnosis not present

## 2016-12-23 DIAGNOSIS — D509 Iron deficiency anemia, unspecified: Secondary | ICD-10-CM | POA: Diagnosis not present

## 2016-12-23 DIAGNOSIS — D631 Anemia in chronic kidney disease: Secondary | ICD-10-CM | POA: Diagnosis not present

## 2016-12-23 DIAGNOSIS — N2581 Secondary hyperparathyroidism of renal origin: Secondary | ICD-10-CM | POA: Diagnosis not present

## 2016-12-25 DIAGNOSIS — N2581 Secondary hyperparathyroidism of renal origin: Secondary | ICD-10-CM | POA: Diagnosis not present

## 2016-12-25 DIAGNOSIS — D509 Iron deficiency anemia, unspecified: Secondary | ICD-10-CM | POA: Diagnosis not present

## 2016-12-25 DIAGNOSIS — F321 Major depressive disorder, single episode, moderate: Secondary | ICD-10-CM | POA: Diagnosis not present

## 2016-12-25 DIAGNOSIS — N186 End stage renal disease: Secondary | ICD-10-CM | POA: Diagnosis not present

## 2016-12-25 DIAGNOSIS — D631 Anemia in chronic kidney disease: Secondary | ICD-10-CM | POA: Diagnosis not present

## 2016-12-27 DIAGNOSIS — D631 Anemia in chronic kidney disease: Secondary | ICD-10-CM | POA: Diagnosis not present

## 2016-12-27 DIAGNOSIS — N2581 Secondary hyperparathyroidism of renal origin: Secondary | ICD-10-CM | POA: Diagnosis not present

## 2016-12-27 DIAGNOSIS — D509 Iron deficiency anemia, unspecified: Secondary | ICD-10-CM | POA: Diagnosis not present

## 2016-12-27 DIAGNOSIS — N186 End stage renal disease: Secondary | ICD-10-CM | POA: Diagnosis not present

## 2016-12-29 ENCOUNTER — Ambulatory Visit
Admission: RE | Admit: 2016-12-29 | Discharge: 2016-12-29 | Disposition: A | Discharge status: Home / Self Care | Payer: MEDICAID

## 2016-12-29 ENCOUNTER — Ambulatory Visit: Admission: RE | Admit: 2016-12-29 | Discharge: 2016-12-29 | Disposition: A | Discharge status: Home / Self Care

## 2016-12-29 DIAGNOSIS — N186 End stage renal disease: Secondary | ICD-10-CM

## 2016-12-29 DIAGNOSIS — Z992 Dependence on renal dialysis: Secondary | ICD-10-CM

## 2016-12-29 DIAGNOSIS — Z01810 Encounter for preprocedural cardiovascular examination: Secondary | ICD-10-CM

## 2016-12-29 DIAGNOSIS — Z7682 Awaiting organ transplant status: Principal | ICD-10-CM

## 2016-12-29 DIAGNOSIS — Z01818 Encounter for other preprocedural examination: Principal | ICD-10-CM

## 2016-12-30 ENCOUNTER — Ambulatory Visit (INDEPENDENT_AMBULATORY_CARE_PROVIDER_SITE_OTHER): Payer: Self-pay | Admitting: Physician Assistant

## 2016-12-30 DIAGNOSIS — N186 End stage renal disease: Secondary | ICD-10-CM | POA: Diagnosis not present

## 2016-12-30 DIAGNOSIS — D631 Anemia in chronic kidney disease: Secondary | ICD-10-CM | POA: Diagnosis not present

## 2016-12-30 DIAGNOSIS — D509 Iron deficiency anemia, unspecified: Secondary | ICD-10-CM | POA: Diagnosis not present

## 2016-12-30 DIAGNOSIS — N2581 Secondary hyperparathyroidism of renal origin: Secondary | ICD-10-CM | POA: Diagnosis not present

## 2017-01-01 DIAGNOSIS — N2581 Secondary hyperparathyroidism of renal origin: Secondary | ICD-10-CM | POA: Diagnosis not present

## 2017-01-01 DIAGNOSIS — N186 End stage renal disease: Secondary | ICD-10-CM | POA: Diagnosis not present

## 2017-01-01 DIAGNOSIS — D631 Anemia in chronic kidney disease: Secondary | ICD-10-CM | POA: Diagnosis not present

## 2017-01-01 DIAGNOSIS — D509 Iron deficiency anemia, unspecified: Secondary | ICD-10-CM | POA: Diagnosis not present

## 2017-01-03 DIAGNOSIS — D509 Iron deficiency anemia, unspecified: Secondary | ICD-10-CM | POA: Diagnosis not present

## 2017-01-03 DIAGNOSIS — N2581 Secondary hyperparathyroidism of renal origin: Secondary | ICD-10-CM | POA: Diagnosis not present

## 2017-01-03 DIAGNOSIS — D631 Anemia in chronic kidney disease: Secondary | ICD-10-CM | POA: Diagnosis not present

## 2017-01-03 DIAGNOSIS — N186 End stage renal disease: Secondary | ICD-10-CM | POA: Diagnosis not present

## 2017-01-06 ENCOUNTER — Ambulatory Visit
Admission: RE | Admit: 2017-01-06 | Discharge: 2017-01-06 | Payer: MEDICAID | Attending: Nephrology | Admitting: Nephrology

## 2017-01-06 DIAGNOSIS — D631 Anemia in chronic kidney disease: Secondary | ICD-10-CM | POA: Diagnosis not present

## 2017-01-06 DIAGNOSIS — N2581 Secondary hyperparathyroidism of renal origin: Secondary | ICD-10-CM | POA: Diagnosis not present

## 2017-01-06 DIAGNOSIS — N186 End stage renal disease: Secondary | ICD-10-CM | POA: Diagnosis not present

## 2017-01-06 DIAGNOSIS — D509 Iron deficiency anemia, unspecified: Secondary | ICD-10-CM | POA: Diagnosis not present

## 2017-01-08 DIAGNOSIS — D509 Iron deficiency anemia, unspecified: Secondary | ICD-10-CM | POA: Diagnosis not present

## 2017-01-08 DIAGNOSIS — N2581 Secondary hyperparathyroidism of renal origin: Secondary | ICD-10-CM | POA: Diagnosis not present

## 2017-01-08 DIAGNOSIS — N186 End stage renal disease: Secondary | ICD-10-CM | POA: Diagnosis not present

## 2017-01-08 DIAGNOSIS — D631 Anemia in chronic kidney disease: Secondary | ICD-10-CM | POA: Diagnosis not present

## 2017-01-09 DIAGNOSIS — N186 End stage renal disease: Secondary | ICD-10-CM

## 2017-01-09 DIAGNOSIS — Z01818 Encounter for other preprocedural examination: Secondary | ICD-10-CM

## 2017-01-09 DIAGNOSIS — Z7682 Awaiting organ transplant status: Principal | ICD-10-CM

## 2017-01-10 DIAGNOSIS — D509 Iron deficiency anemia, unspecified: Secondary | ICD-10-CM | POA: Diagnosis not present

## 2017-01-10 DIAGNOSIS — N186 End stage renal disease: Secondary | ICD-10-CM | POA: Diagnosis not present

## 2017-01-10 DIAGNOSIS — D631 Anemia in chronic kidney disease: Secondary | ICD-10-CM | POA: Diagnosis not present

## 2017-01-10 DIAGNOSIS — N2581 Secondary hyperparathyroidism of renal origin: Secondary | ICD-10-CM | POA: Diagnosis not present

## 2017-01-13 DIAGNOSIS — N186 End stage renal disease: Secondary | ICD-10-CM | POA: Diagnosis not present

## 2017-01-13 DIAGNOSIS — T8612 Kidney transplant failure: Secondary | ICD-10-CM | POA: Diagnosis not present

## 2017-01-13 DIAGNOSIS — N2581 Secondary hyperparathyroidism of renal origin: Secondary | ICD-10-CM | POA: Diagnosis not present

## 2017-01-13 DIAGNOSIS — D509 Iron deficiency anemia, unspecified: Secondary | ICD-10-CM | POA: Diagnosis not present

## 2017-01-13 DIAGNOSIS — D631 Anemia in chronic kidney disease: Secondary | ICD-10-CM | POA: Diagnosis not present

## 2017-01-13 DIAGNOSIS — Z992 Dependence on renal dialysis: Secondary | ICD-10-CM | POA: Diagnosis not present

## 2017-01-15 DIAGNOSIS — D631 Anemia in chronic kidney disease: Secondary | ICD-10-CM | POA: Diagnosis not present

## 2017-01-15 DIAGNOSIS — N2581 Secondary hyperparathyroidism of renal origin: Secondary | ICD-10-CM | POA: Diagnosis not present

## 2017-01-15 DIAGNOSIS — D509 Iron deficiency anemia, unspecified: Secondary | ICD-10-CM | POA: Diagnosis not present

## 2017-01-15 DIAGNOSIS — N186 End stage renal disease: Secondary | ICD-10-CM | POA: Diagnosis not present

## 2017-01-17 DIAGNOSIS — N2581 Secondary hyperparathyroidism of renal origin: Secondary | ICD-10-CM | POA: Diagnosis not present

## 2017-01-17 DIAGNOSIS — D631 Anemia in chronic kidney disease: Secondary | ICD-10-CM | POA: Diagnosis not present

## 2017-01-17 DIAGNOSIS — D509 Iron deficiency anemia, unspecified: Secondary | ICD-10-CM | POA: Diagnosis not present

## 2017-01-17 DIAGNOSIS — N186 End stage renal disease: Secondary | ICD-10-CM | POA: Diagnosis not present

## 2017-01-20 DIAGNOSIS — N2581 Secondary hyperparathyroidism of renal origin: Secondary | ICD-10-CM | POA: Diagnosis not present

## 2017-01-20 DIAGNOSIS — N186 End stage renal disease: Secondary | ICD-10-CM | POA: Diagnosis not present

## 2017-01-20 DIAGNOSIS — D631 Anemia in chronic kidney disease: Secondary | ICD-10-CM | POA: Diagnosis not present

## 2017-01-20 DIAGNOSIS — D509 Iron deficiency anemia, unspecified: Secondary | ICD-10-CM | POA: Diagnosis not present

## 2017-01-22 DIAGNOSIS — D631 Anemia in chronic kidney disease: Secondary | ICD-10-CM | POA: Diagnosis not present

## 2017-01-22 DIAGNOSIS — D509 Iron deficiency anemia, unspecified: Secondary | ICD-10-CM | POA: Diagnosis not present

## 2017-01-22 DIAGNOSIS — N186 End stage renal disease: Secondary | ICD-10-CM | POA: Diagnosis not present

## 2017-01-22 DIAGNOSIS — N2581 Secondary hyperparathyroidism of renal origin: Secondary | ICD-10-CM | POA: Diagnosis not present

## 2017-01-24 DIAGNOSIS — D631 Anemia in chronic kidney disease: Secondary | ICD-10-CM | POA: Diagnosis not present

## 2017-01-24 DIAGNOSIS — N186 End stage renal disease: Secondary | ICD-10-CM | POA: Diagnosis not present

## 2017-01-24 DIAGNOSIS — D509 Iron deficiency anemia, unspecified: Secondary | ICD-10-CM | POA: Diagnosis not present

## 2017-01-24 DIAGNOSIS — N2581 Secondary hyperparathyroidism of renal origin: Secondary | ICD-10-CM | POA: Diagnosis not present

## 2017-01-27 ENCOUNTER — Ambulatory Visit
Admission: RE | Admit: 2017-01-27 | Discharge: 2017-01-27 | Payer: MEDICAID | Attending: Nephrology | Admitting: Nephrology

## 2017-01-27 DIAGNOSIS — D509 Iron deficiency anemia, unspecified: Secondary | ICD-10-CM | POA: Diagnosis not present

## 2017-01-27 DIAGNOSIS — D631 Anemia in chronic kidney disease: Secondary | ICD-10-CM | POA: Diagnosis not present

## 2017-01-27 DIAGNOSIS — N2581 Secondary hyperparathyroidism of renal origin: Secondary | ICD-10-CM | POA: Diagnosis not present

## 2017-01-27 DIAGNOSIS — N186 End stage renal disease: Secondary | ICD-10-CM | POA: Diagnosis not present

## 2017-01-29 DIAGNOSIS — N186 End stage renal disease: Secondary | ICD-10-CM | POA: Diagnosis not present

## 2017-01-29 DIAGNOSIS — D509 Iron deficiency anemia, unspecified: Secondary | ICD-10-CM | POA: Diagnosis not present

## 2017-01-29 DIAGNOSIS — D631 Anemia in chronic kidney disease: Secondary | ICD-10-CM | POA: Diagnosis not present

## 2017-01-29 DIAGNOSIS — N2581 Secondary hyperparathyroidism of renal origin: Secondary | ICD-10-CM | POA: Diagnosis not present

## 2017-01-30 DIAGNOSIS — N186 End stage renal disease: Secondary | ICD-10-CM

## 2017-01-30 DIAGNOSIS — Z7682 Awaiting organ transplant status: Principal | ICD-10-CM

## 2017-01-30 DIAGNOSIS — Z01818 Encounter for other preprocedural examination: Secondary | ICD-10-CM

## 2017-01-31 DIAGNOSIS — D631 Anemia in chronic kidney disease: Secondary | ICD-10-CM | POA: Diagnosis not present

## 2017-01-31 DIAGNOSIS — D509 Iron deficiency anemia, unspecified: Secondary | ICD-10-CM | POA: Diagnosis not present

## 2017-01-31 DIAGNOSIS — N186 End stage renal disease: Secondary | ICD-10-CM | POA: Diagnosis not present

## 2017-01-31 DIAGNOSIS — N2581 Secondary hyperparathyroidism of renal origin: Secondary | ICD-10-CM | POA: Diagnosis not present

## 2017-02-03 DIAGNOSIS — D509 Iron deficiency anemia, unspecified: Secondary | ICD-10-CM | POA: Diagnosis not present

## 2017-02-03 DIAGNOSIS — D631 Anemia in chronic kidney disease: Secondary | ICD-10-CM | POA: Diagnosis not present

## 2017-02-03 DIAGNOSIS — N2581 Secondary hyperparathyroidism of renal origin: Secondary | ICD-10-CM | POA: Diagnosis not present

## 2017-02-03 DIAGNOSIS — N186 End stage renal disease: Secondary | ICD-10-CM | POA: Diagnosis not present

## 2017-02-05 DIAGNOSIS — N186 End stage renal disease: Secondary | ICD-10-CM | POA: Diagnosis not present

## 2017-02-05 DIAGNOSIS — D631 Anemia in chronic kidney disease: Secondary | ICD-10-CM | POA: Diagnosis not present

## 2017-02-05 DIAGNOSIS — N2581 Secondary hyperparathyroidism of renal origin: Secondary | ICD-10-CM | POA: Diagnosis not present

## 2017-02-05 DIAGNOSIS — D509 Iron deficiency anemia, unspecified: Secondary | ICD-10-CM | POA: Diagnosis not present

## 2017-02-07 DIAGNOSIS — D509 Iron deficiency anemia, unspecified: Secondary | ICD-10-CM | POA: Diagnosis not present

## 2017-02-07 DIAGNOSIS — D631 Anemia in chronic kidney disease: Secondary | ICD-10-CM | POA: Diagnosis not present

## 2017-02-07 DIAGNOSIS — N2581 Secondary hyperparathyroidism of renal origin: Secondary | ICD-10-CM | POA: Diagnosis not present

## 2017-02-07 DIAGNOSIS — N186 End stage renal disease: Secondary | ICD-10-CM | POA: Diagnosis not present

## 2017-02-10 ENCOUNTER — Other Ambulatory Visit: Payer: Self-pay | Admitting: Nephrology

## 2017-02-10 ENCOUNTER — Ambulatory Visit
Admission: RE | Admit: 2017-02-10 | Discharge: 2017-02-10 | Disposition: A | Discharge status: Home / Self Care | Payer: Medicaid Other | Source: Ambulatory Visit | Attending: Nephrology | Admitting: Nephrology

## 2017-02-10 DIAGNOSIS — D631 Anemia in chronic kidney disease: Secondary | ICD-10-CM | POA: Diagnosis not present

## 2017-02-10 DIAGNOSIS — N2581 Secondary hyperparathyroidism of renal origin: Secondary | ICD-10-CM | POA: Diagnosis not present

## 2017-02-10 DIAGNOSIS — R7611 Nonspecific reaction to tuberculin skin test without active tuberculosis: Secondary | ICD-10-CM

## 2017-02-10 DIAGNOSIS — D509 Iron deficiency anemia, unspecified: Secondary | ICD-10-CM | POA: Diagnosis not present

## 2017-02-10 DIAGNOSIS — N186 End stage renal disease: Secondary | ICD-10-CM | POA: Diagnosis not present

## 2017-02-12 DIAGNOSIS — N186 End stage renal disease: Secondary | ICD-10-CM | POA: Diagnosis not present

## 2017-02-12 DIAGNOSIS — D631 Anemia in chronic kidney disease: Secondary | ICD-10-CM | POA: Diagnosis not present

## 2017-02-12 DIAGNOSIS — D509 Iron deficiency anemia, unspecified: Secondary | ICD-10-CM | POA: Diagnosis not present

## 2017-02-12 DIAGNOSIS — N2581 Secondary hyperparathyroidism of renal origin: Secondary | ICD-10-CM | POA: Diagnosis not present

## 2017-02-13 DIAGNOSIS — T8612 Kidney transplant failure: Secondary | ICD-10-CM | POA: Diagnosis not present

## 2017-02-13 DIAGNOSIS — N186 End stage renal disease: Secondary | ICD-10-CM | POA: Diagnosis not present

## 2017-02-13 DIAGNOSIS — Z992 Dependence on renal dialysis: Secondary | ICD-10-CM | POA: Diagnosis not present

## 2017-02-14 DIAGNOSIS — N2581 Secondary hyperparathyroidism of renal origin: Secondary | ICD-10-CM | POA: Diagnosis not present

## 2017-02-14 DIAGNOSIS — D509 Iron deficiency anemia, unspecified: Secondary | ICD-10-CM | POA: Diagnosis not present

## 2017-02-14 DIAGNOSIS — Z23 Encounter for immunization: Secondary | ICD-10-CM | POA: Diagnosis not present

## 2017-02-14 DIAGNOSIS — D631 Anemia in chronic kidney disease: Secondary | ICD-10-CM | POA: Diagnosis not present

## 2017-02-14 DIAGNOSIS — N186 End stage renal disease: Secondary | ICD-10-CM | POA: Diagnosis not present

## 2017-02-17 DIAGNOSIS — D631 Anemia in chronic kidney disease: Secondary | ICD-10-CM | POA: Diagnosis not present

## 2017-02-17 DIAGNOSIS — N2581 Secondary hyperparathyroidism of renal origin: Secondary | ICD-10-CM | POA: Diagnosis not present

## 2017-02-17 DIAGNOSIS — Z23 Encounter for immunization: Secondary | ICD-10-CM | POA: Diagnosis not present

## 2017-02-17 DIAGNOSIS — N186 End stage renal disease: Secondary | ICD-10-CM | POA: Diagnosis not present

## 2017-02-17 DIAGNOSIS — D509 Iron deficiency anemia, unspecified: Secondary | ICD-10-CM | POA: Diagnosis not present

## 2017-02-19 DIAGNOSIS — Z23 Encounter for immunization: Secondary | ICD-10-CM | POA: Diagnosis not present

## 2017-02-19 DIAGNOSIS — N186 End stage renal disease: Secondary | ICD-10-CM | POA: Diagnosis not present

## 2017-02-19 DIAGNOSIS — N2581 Secondary hyperparathyroidism of renal origin: Secondary | ICD-10-CM | POA: Diagnosis not present

## 2017-02-19 DIAGNOSIS — D509 Iron deficiency anemia, unspecified: Secondary | ICD-10-CM | POA: Diagnosis not present

## 2017-02-19 DIAGNOSIS — D631 Anemia in chronic kidney disease: Secondary | ICD-10-CM | POA: Diagnosis not present

## 2017-02-21 DIAGNOSIS — N186 End stage renal disease: Secondary | ICD-10-CM | POA: Diagnosis not present

## 2017-02-21 DIAGNOSIS — D631 Anemia in chronic kidney disease: Secondary | ICD-10-CM | POA: Diagnosis not present

## 2017-02-21 DIAGNOSIS — D509 Iron deficiency anemia, unspecified: Secondary | ICD-10-CM | POA: Diagnosis not present

## 2017-02-21 DIAGNOSIS — N2581 Secondary hyperparathyroidism of renal origin: Secondary | ICD-10-CM | POA: Diagnosis not present

## 2017-02-21 DIAGNOSIS — Z23 Encounter for immunization: Secondary | ICD-10-CM | POA: Diagnosis not present

## 2017-02-24 DIAGNOSIS — D509 Iron deficiency anemia, unspecified: Secondary | ICD-10-CM | POA: Diagnosis not present

## 2017-02-24 DIAGNOSIS — N186 End stage renal disease: Secondary | ICD-10-CM | POA: Diagnosis not present

## 2017-02-24 DIAGNOSIS — N2581 Secondary hyperparathyroidism of renal origin: Secondary | ICD-10-CM | POA: Diagnosis not present

## 2017-02-24 DIAGNOSIS — D631 Anemia in chronic kidney disease: Secondary | ICD-10-CM | POA: Diagnosis not present

## 2017-02-24 DIAGNOSIS — Z23 Encounter for immunization: Secondary | ICD-10-CM | POA: Diagnosis not present

## 2017-02-26 DIAGNOSIS — N186 End stage renal disease: Secondary | ICD-10-CM | POA: Diagnosis not present

## 2017-02-26 DIAGNOSIS — N2581 Secondary hyperparathyroidism of renal origin: Secondary | ICD-10-CM | POA: Diagnosis not present

## 2017-02-26 DIAGNOSIS — Z23 Encounter for immunization: Secondary | ICD-10-CM | POA: Diagnosis not present

## 2017-02-26 DIAGNOSIS — D509 Iron deficiency anemia, unspecified: Secondary | ICD-10-CM | POA: Diagnosis not present

## 2017-02-26 DIAGNOSIS — D631 Anemia in chronic kidney disease: Secondary | ICD-10-CM | POA: Diagnosis not present

## 2017-02-28 DIAGNOSIS — N2581 Secondary hyperparathyroidism of renal origin: Secondary | ICD-10-CM | POA: Diagnosis not present

## 2017-02-28 DIAGNOSIS — N186 End stage renal disease: Secondary | ICD-10-CM | POA: Diagnosis not present

## 2017-02-28 DIAGNOSIS — D631 Anemia in chronic kidney disease: Secondary | ICD-10-CM | POA: Diagnosis not present

## 2017-02-28 DIAGNOSIS — Z23 Encounter for immunization: Secondary | ICD-10-CM | POA: Diagnosis not present

## 2017-02-28 DIAGNOSIS — D509 Iron deficiency anemia, unspecified: Secondary | ICD-10-CM | POA: Diagnosis not present

## 2017-03-03 DIAGNOSIS — Z23 Encounter for immunization: Secondary | ICD-10-CM | POA: Diagnosis not present

## 2017-03-03 DIAGNOSIS — D631 Anemia in chronic kidney disease: Secondary | ICD-10-CM | POA: Diagnosis not present

## 2017-03-03 DIAGNOSIS — N186 End stage renal disease: Secondary | ICD-10-CM | POA: Diagnosis not present

## 2017-03-03 DIAGNOSIS — D509 Iron deficiency anemia, unspecified: Secondary | ICD-10-CM | POA: Diagnosis not present

## 2017-03-03 DIAGNOSIS — N2581 Secondary hyperparathyroidism of renal origin: Secondary | ICD-10-CM | POA: Diagnosis not present

## 2017-03-05 DIAGNOSIS — Z23 Encounter for immunization: Secondary | ICD-10-CM | POA: Diagnosis not present

## 2017-03-05 DIAGNOSIS — N186 End stage renal disease: Secondary | ICD-10-CM | POA: Diagnosis not present

## 2017-03-05 DIAGNOSIS — N2581 Secondary hyperparathyroidism of renal origin: Secondary | ICD-10-CM | POA: Diagnosis not present

## 2017-03-05 DIAGNOSIS — D509 Iron deficiency anemia, unspecified: Secondary | ICD-10-CM | POA: Diagnosis not present

## 2017-03-05 DIAGNOSIS — D631 Anemia in chronic kidney disease: Secondary | ICD-10-CM | POA: Diagnosis not present

## 2017-03-07 DIAGNOSIS — N186 End stage renal disease: Secondary | ICD-10-CM | POA: Diagnosis not present

## 2017-03-07 DIAGNOSIS — Z23 Encounter for immunization: Secondary | ICD-10-CM | POA: Diagnosis not present

## 2017-03-07 DIAGNOSIS — D631 Anemia in chronic kidney disease: Secondary | ICD-10-CM | POA: Diagnosis not present

## 2017-03-07 DIAGNOSIS — D509 Iron deficiency anemia, unspecified: Secondary | ICD-10-CM | POA: Diagnosis not present

## 2017-03-07 DIAGNOSIS — N2581 Secondary hyperparathyroidism of renal origin: Secondary | ICD-10-CM | POA: Diagnosis not present

## 2017-03-10 ENCOUNTER — Ambulatory Visit
Admission: RE | Admit: 2017-03-10 | Discharge: 2017-03-10 | Payer: MEDICAID | Attending: Nephrology | Admitting: Nephrology

## 2017-03-10 DIAGNOSIS — D631 Anemia in chronic kidney disease: Secondary | ICD-10-CM | POA: Diagnosis not present

## 2017-03-10 DIAGNOSIS — D509 Iron deficiency anemia, unspecified: Secondary | ICD-10-CM | POA: Diagnosis not present

## 2017-03-10 DIAGNOSIS — N2581 Secondary hyperparathyroidism of renal origin: Secondary | ICD-10-CM | POA: Diagnosis not present

## 2017-03-10 DIAGNOSIS — N186 End stage renal disease: Secondary | ICD-10-CM | POA: Diagnosis not present

## 2017-03-10 DIAGNOSIS — Z23 Encounter for immunization: Secondary | ICD-10-CM | POA: Diagnosis not present

## 2017-03-12 DIAGNOSIS — D631 Anemia in chronic kidney disease: Secondary | ICD-10-CM | POA: Diagnosis not present

## 2017-03-12 DIAGNOSIS — N2581 Secondary hyperparathyroidism of renal origin: Secondary | ICD-10-CM | POA: Diagnosis not present

## 2017-03-12 DIAGNOSIS — N186 End stage renal disease: Secondary | ICD-10-CM | POA: Diagnosis not present

## 2017-03-12 DIAGNOSIS — Z23 Encounter for immunization: Secondary | ICD-10-CM | POA: Diagnosis not present

## 2017-03-12 DIAGNOSIS — D509 Iron deficiency anemia, unspecified: Secondary | ICD-10-CM | POA: Diagnosis not present

## 2017-03-13 DIAGNOSIS — Z01818 Encounter for other preprocedural examination: Secondary | ICD-10-CM

## 2017-03-13 DIAGNOSIS — Z7682 Awaiting organ transplant status: Principal | ICD-10-CM

## 2017-03-13 DIAGNOSIS — N186 End stage renal disease: Secondary | ICD-10-CM

## 2017-03-14 DIAGNOSIS — D631 Anemia in chronic kidney disease: Secondary | ICD-10-CM | POA: Diagnosis not present

## 2017-03-14 DIAGNOSIS — N186 End stage renal disease: Secondary | ICD-10-CM | POA: Diagnosis not present

## 2017-03-14 DIAGNOSIS — D509 Iron deficiency anemia, unspecified: Secondary | ICD-10-CM | POA: Diagnosis not present

## 2017-03-14 DIAGNOSIS — N2581 Secondary hyperparathyroidism of renal origin: Secondary | ICD-10-CM | POA: Diagnosis not present

## 2017-03-14 DIAGNOSIS — Z23 Encounter for immunization: Secondary | ICD-10-CM | POA: Diagnosis not present

## 2017-03-15 DIAGNOSIS — N186 End stage renal disease: Secondary | ICD-10-CM | POA: Diagnosis not present

## 2017-03-15 DIAGNOSIS — Z992 Dependence on renal dialysis: Secondary | ICD-10-CM | POA: Diagnosis not present

## 2017-03-15 DIAGNOSIS — T8612 Kidney transplant failure: Secondary | ICD-10-CM | POA: Diagnosis not present

## 2017-03-17 DIAGNOSIS — N186 End stage renal disease: Secondary | ICD-10-CM | POA: Diagnosis not present

## 2017-03-17 DIAGNOSIS — N2581 Secondary hyperparathyroidism of renal origin: Secondary | ICD-10-CM | POA: Diagnosis not present

## 2017-03-17 DIAGNOSIS — D509 Iron deficiency anemia, unspecified: Secondary | ICD-10-CM | POA: Diagnosis not present

## 2017-03-17 DIAGNOSIS — D631 Anemia in chronic kidney disease: Secondary | ICD-10-CM | POA: Diagnosis not present

## 2017-03-19 DIAGNOSIS — N2581 Secondary hyperparathyroidism of renal origin: Secondary | ICD-10-CM | POA: Diagnosis not present

## 2017-03-19 DIAGNOSIS — D509 Iron deficiency anemia, unspecified: Secondary | ICD-10-CM | POA: Diagnosis not present

## 2017-03-19 DIAGNOSIS — N186 End stage renal disease: Secondary | ICD-10-CM | POA: Diagnosis not present

## 2017-03-19 DIAGNOSIS — D631 Anemia in chronic kidney disease: Secondary | ICD-10-CM | POA: Diagnosis not present

## 2017-03-21 DIAGNOSIS — D631 Anemia in chronic kidney disease: Secondary | ICD-10-CM | POA: Diagnosis not present

## 2017-03-21 DIAGNOSIS — N186 End stage renal disease: Secondary | ICD-10-CM | POA: Diagnosis not present

## 2017-03-21 DIAGNOSIS — D509 Iron deficiency anemia, unspecified: Secondary | ICD-10-CM | POA: Diagnosis not present

## 2017-03-21 DIAGNOSIS — N2581 Secondary hyperparathyroidism of renal origin: Secondary | ICD-10-CM | POA: Diagnosis not present

## 2017-03-24 DIAGNOSIS — N2581 Secondary hyperparathyroidism of renal origin: Secondary | ICD-10-CM | POA: Diagnosis not present

## 2017-03-24 DIAGNOSIS — N186 End stage renal disease: Secondary | ICD-10-CM | POA: Diagnosis not present

## 2017-03-24 DIAGNOSIS — D509 Iron deficiency anemia, unspecified: Secondary | ICD-10-CM | POA: Diagnosis not present

## 2017-03-24 DIAGNOSIS — D631 Anemia in chronic kidney disease: Secondary | ICD-10-CM | POA: Diagnosis not present

## 2017-03-26 DIAGNOSIS — D631 Anemia in chronic kidney disease: Secondary | ICD-10-CM | POA: Diagnosis not present

## 2017-03-26 DIAGNOSIS — N2581 Secondary hyperparathyroidism of renal origin: Secondary | ICD-10-CM | POA: Diagnosis not present

## 2017-03-26 DIAGNOSIS — D509 Iron deficiency anemia, unspecified: Secondary | ICD-10-CM | POA: Diagnosis not present

## 2017-03-26 DIAGNOSIS — N186 End stage renal disease: Secondary | ICD-10-CM | POA: Diagnosis not present

## 2017-03-28 DIAGNOSIS — D631 Anemia in chronic kidney disease: Secondary | ICD-10-CM | POA: Diagnosis not present

## 2017-03-28 DIAGNOSIS — D509 Iron deficiency anemia, unspecified: Secondary | ICD-10-CM | POA: Diagnosis not present

## 2017-03-28 DIAGNOSIS — N186 End stage renal disease: Secondary | ICD-10-CM | POA: Diagnosis not present

## 2017-03-28 DIAGNOSIS — N2581 Secondary hyperparathyroidism of renal origin: Secondary | ICD-10-CM | POA: Diagnosis not present

## 2017-03-31 ENCOUNTER — Ambulatory Visit: Admission: RE | Admit: 2017-03-31 | Discharge: 2017-03-31 | Attending: Nephrology | Admitting: Nephrology

## 2017-03-31 DIAGNOSIS — N2581 Secondary hyperparathyroidism of renal origin: Secondary | ICD-10-CM | POA: Diagnosis not present

## 2017-03-31 DIAGNOSIS — D509 Iron deficiency anemia, unspecified: Secondary | ICD-10-CM | POA: Diagnosis not present

## 2017-03-31 DIAGNOSIS — D631 Anemia in chronic kidney disease: Secondary | ICD-10-CM | POA: Diagnosis not present

## 2017-03-31 DIAGNOSIS — N186 End stage renal disease: Secondary | ICD-10-CM | POA: Diagnosis not present

## 2017-04-02 DIAGNOSIS — D631 Anemia in chronic kidney disease: Secondary | ICD-10-CM | POA: Diagnosis not present

## 2017-04-02 DIAGNOSIS — N186 End stage renal disease: Secondary | ICD-10-CM | POA: Diagnosis not present

## 2017-04-02 DIAGNOSIS — N2581 Secondary hyperparathyroidism of renal origin: Secondary | ICD-10-CM | POA: Diagnosis not present

## 2017-04-02 DIAGNOSIS — D509 Iron deficiency anemia, unspecified: Secondary | ICD-10-CM | POA: Diagnosis not present

## 2017-04-04 DIAGNOSIS — D631 Anemia in chronic kidney disease: Secondary | ICD-10-CM | POA: Diagnosis not present

## 2017-04-04 DIAGNOSIS — N186 End stage renal disease: Secondary | ICD-10-CM | POA: Diagnosis not present

## 2017-04-04 DIAGNOSIS — D509 Iron deficiency anemia, unspecified: Secondary | ICD-10-CM | POA: Diagnosis not present

## 2017-04-04 DIAGNOSIS — N2581 Secondary hyperparathyroidism of renal origin: Secondary | ICD-10-CM | POA: Diagnosis not present

## 2017-04-07 DIAGNOSIS — D631 Anemia in chronic kidney disease: Secondary | ICD-10-CM | POA: Diagnosis not present

## 2017-04-07 DIAGNOSIS — N2581 Secondary hyperparathyroidism of renal origin: Secondary | ICD-10-CM | POA: Diagnosis not present

## 2017-04-07 DIAGNOSIS — N186 End stage renal disease: Secondary | ICD-10-CM | POA: Diagnosis not present

## 2017-04-07 DIAGNOSIS — D509 Iron deficiency anemia, unspecified: Secondary | ICD-10-CM | POA: Diagnosis not present

## 2017-04-09 DIAGNOSIS — Z01818 Encounter for other preprocedural examination: Secondary | ICD-10-CM

## 2017-04-09 DIAGNOSIS — Z7682 Awaiting organ transplant status: Principal | ICD-10-CM

## 2017-04-09 DIAGNOSIS — N186 End stage renal disease: Secondary | ICD-10-CM

## 2017-04-09 DIAGNOSIS — D631 Anemia in chronic kidney disease: Secondary | ICD-10-CM | POA: Diagnosis not present

## 2017-04-09 DIAGNOSIS — N2581 Secondary hyperparathyroidism of renal origin: Secondary | ICD-10-CM | POA: Diagnosis not present

## 2017-04-09 DIAGNOSIS — D509 Iron deficiency anemia, unspecified: Secondary | ICD-10-CM | POA: Diagnosis not present

## 2017-04-11 DIAGNOSIS — D509 Iron deficiency anemia, unspecified: Secondary | ICD-10-CM | POA: Diagnosis not present

## 2017-04-11 DIAGNOSIS — N186 End stage renal disease: Secondary | ICD-10-CM | POA: Diagnosis not present

## 2017-04-11 DIAGNOSIS — D631 Anemia in chronic kidney disease: Secondary | ICD-10-CM | POA: Diagnosis not present

## 2017-04-11 DIAGNOSIS — N2581 Secondary hyperparathyroidism of renal origin: Secondary | ICD-10-CM | POA: Diagnosis not present

## 2017-04-14 DIAGNOSIS — D509 Iron deficiency anemia, unspecified: Secondary | ICD-10-CM | POA: Diagnosis not present

## 2017-04-14 DIAGNOSIS — N2581 Secondary hyperparathyroidism of renal origin: Secondary | ICD-10-CM | POA: Diagnosis not present

## 2017-04-14 DIAGNOSIS — N186 End stage renal disease: Secondary | ICD-10-CM | POA: Diagnosis not present

## 2017-04-14 DIAGNOSIS — D631 Anemia in chronic kidney disease: Secondary | ICD-10-CM | POA: Diagnosis not present

## 2017-04-15 DIAGNOSIS — N186 End stage renal disease: Secondary | ICD-10-CM | POA: Diagnosis not present

## 2017-04-15 DIAGNOSIS — Z992 Dependence on renal dialysis: Secondary | ICD-10-CM | POA: Diagnosis not present

## 2017-04-15 DIAGNOSIS — T8612 Kidney transplant failure: Secondary | ICD-10-CM | POA: Diagnosis not present

## 2017-04-16 DIAGNOSIS — N2581 Secondary hyperparathyroidism of renal origin: Secondary | ICD-10-CM | POA: Diagnosis not present

## 2017-04-16 DIAGNOSIS — D509 Iron deficiency anemia, unspecified: Secondary | ICD-10-CM | POA: Diagnosis not present

## 2017-04-16 DIAGNOSIS — D631 Anemia in chronic kidney disease: Secondary | ICD-10-CM | POA: Diagnosis not present

## 2017-04-16 DIAGNOSIS — N186 End stage renal disease: Secondary | ICD-10-CM | POA: Diagnosis not present

## 2017-04-18 DIAGNOSIS — D509 Iron deficiency anemia, unspecified: Secondary | ICD-10-CM | POA: Diagnosis not present

## 2017-04-18 DIAGNOSIS — N2581 Secondary hyperparathyroidism of renal origin: Secondary | ICD-10-CM | POA: Diagnosis not present

## 2017-04-18 DIAGNOSIS — D631 Anemia in chronic kidney disease: Secondary | ICD-10-CM | POA: Diagnosis not present

## 2017-04-18 DIAGNOSIS — N186 End stage renal disease: Secondary | ICD-10-CM | POA: Diagnosis not present

## 2017-04-21 DIAGNOSIS — D509 Iron deficiency anemia, unspecified: Secondary | ICD-10-CM | POA: Diagnosis not present

## 2017-04-21 DIAGNOSIS — D631 Anemia in chronic kidney disease: Secondary | ICD-10-CM | POA: Diagnosis not present

## 2017-04-21 DIAGNOSIS — N2581 Secondary hyperparathyroidism of renal origin: Secondary | ICD-10-CM | POA: Diagnosis not present

## 2017-04-21 DIAGNOSIS — N186 End stage renal disease: Secondary | ICD-10-CM | POA: Diagnosis not present

## 2017-04-23 DIAGNOSIS — D509 Iron deficiency anemia, unspecified: Secondary | ICD-10-CM | POA: Diagnosis not present

## 2017-04-23 DIAGNOSIS — D631 Anemia in chronic kidney disease: Secondary | ICD-10-CM | POA: Diagnosis not present

## 2017-04-23 DIAGNOSIS — N2581 Secondary hyperparathyroidism of renal origin: Secondary | ICD-10-CM | POA: Diagnosis not present

## 2017-04-23 DIAGNOSIS — N186 End stage renal disease: Secondary | ICD-10-CM | POA: Diagnosis not present

## 2017-04-25 DIAGNOSIS — N186 End stage renal disease: Secondary | ICD-10-CM | POA: Diagnosis not present

## 2017-04-25 DIAGNOSIS — N2581 Secondary hyperparathyroidism of renal origin: Secondary | ICD-10-CM | POA: Diagnosis not present

## 2017-04-25 DIAGNOSIS — D631 Anemia in chronic kidney disease: Secondary | ICD-10-CM | POA: Diagnosis not present

## 2017-04-25 DIAGNOSIS — D509 Iron deficiency anemia, unspecified: Secondary | ICD-10-CM | POA: Diagnosis not present

## 2017-04-28 ENCOUNTER — Ambulatory Visit
Admission: RE | Admit: 2017-04-28 | Discharge: 2017-04-28 | Payer: MEDICARE | Attending: Nephrology | Admitting: Nephrology

## 2017-04-28 DIAGNOSIS — D631 Anemia in chronic kidney disease: Secondary | ICD-10-CM | POA: Diagnosis not present

## 2017-04-28 DIAGNOSIS — D509 Iron deficiency anemia, unspecified: Secondary | ICD-10-CM | POA: Diagnosis not present

## 2017-04-28 DIAGNOSIS — N186 End stage renal disease: Secondary | ICD-10-CM | POA: Diagnosis not present

## 2017-04-28 DIAGNOSIS — N2581 Secondary hyperparathyroidism of renal origin: Secondary | ICD-10-CM | POA: Diagnosis not present

## 2017-04-30 DIAGNOSIS — D631 Anemia in chronic kidney disease: Secondary | ICD-10-CM | POA: Diagnosis not present

## 2017-04-30 DIAGNOSIS — N186 End stage renal disease: Secondary | ICD-10-CM | POA: Diagnosis not present

## 2017-04-30 DIAGNOSIS — N2581 Secondary hyperparathyroidism of renal origin: Secondary | ICD-10-CM | POA: Diagnosis not present

## 2017-04-30 DIAGNOSIS — D509 Iron deficiency anemia, unspecified: Secondary | ICD-10-CM | POA: Diagnosis not present

## 2017-05-01 DIAGNOSIS — N186 End stage renal disease: Secondary | ICD-10-CM

## 2017-05-01 DIAGNOSIS — Z7682 Awaiting organ transplant status: Principal | ICD-10-CM

## 2017-05-01 DIAGNOSIS — Z01818 Encounter for other preprocedural examination: Secondary | ICD-10-CM

## 2017-05-02 DIAGNOSIS — D509 Iron deficiency anemia, unspecified: Secondary | ICD-10-CM | POA: Diagnosis not present

## 2017-05-02 DIAGNOSIS — N186 End stage renal disease: Secondary | ICD-10-CM | POA: Diagnosis not present

## 2017-05-02 DIAGNOSIS — D631 Anemia in chronic kidney disease: Secondary | ICD-10-CM | POA: Diagnosis not present

## 2017-05-02 DIAGNOSIS — N2581 Secondary hyperparathyroidism of renal origin: Secondary | ICD-10-CM | POA: Diagnosis not present

## 2017-05-04 DIAGNOSIS — D509 Iron deficiency anemia, unspecified: Secondary | ICD-10-CM | POA: Diagnosis not present

## 2017-05-04 DIAGNOSIS — D631 Anemia in chronic kidney disease: Secondary | ICD-10-CM | POA: Diagnosis not present

## 2017-05-04 DIAGNOSIS — N186 End stage renal disease: Secondary | ICD-10-CM | POA: Diagnosis not present

## 2017-05-04 DIAGNOSIS — N2581 Secondary hyperparathyroidism of renal origin: Secondary | ICD-10-CM | POA: Diagnosis not present

## 2017-05-06 DIAGNOSIS — D631 Anemia in chronic kidney disease: Secondary | ICD-10-CM | POA: Diagnosis not present

## 2017-05-06 DIAGNOSIS — N2581 Secondary hyperparathyroidism of renal origin: Secondary | ICD-10-CM | POA: Diagnosis not present

## 2017-05-06 DIAGNOSIS — N186 End stage renal disease: Secondary | ICD-10-CM | POA: Diagnosis not present

## 2017-05-06 DIAGNOSIS — D509 Iron deficiency anemia, unspecified: Secondary | ICD-10-CM | POA: Diagnosis not present

## 2017-05-09 DIAGNOSIS — N2581 Secondary hyperparathyroidism of renal origin: Secondary | ICD-10-CM | POA: Diagnosis not present

## 2017-05-09 DIAGNOSIS — D631 Anemia in chronic kidney disease: Secondary | ICD-10-CM | POA: Diagnosis not present

## 2017-05-09 DIAGNOSIS — N186 End stage renal disease: Secondary | ICD-10-CM | POA: Diagnosis not present

## 2017-05-09 DIAGNOSIS — D509 Iron deficiency anemia, unspecified: Secondary | ICD-10-CM | POA: Diagnosis not present

## 2017-05-12 DIAGNOSIS — N2581 Secondary hyperparathyroidism of renal origin: Secondary | ICD-10-CM | POA: Diagnosis not present

## 2017-05-12 DIAGNOSIS — N186 End stage renal disease: Secondary | ICD-10-CM | POA: Diagnosis not present

## 2017-05-12 DIAGNOSIS — D631 Anemia in chronic kidney disease: Secondary | ICD-10-CM | POA: Diagnosis not present

## 2017-05-12 DIAGNOSIS — D509 Iron deficiency anemia, unspecified: Secondary | ICD-10-CM | POA: Diagnosis not present

## 2017-05-14 DIAGNOSIS — N186 End stage renal disease: Secondary | ICD-10-CM | POA: Diagnosis not present

## 2017-05-14 DIAGNOSIS — N2581 Secondary hyperparathyroidism of renal origin: Secondary | ICD-10-CM | POA: Diagnosis not present

## 2017-05-14 DIAGNOSIS — D509 Iron deficiency anemia, unspecified: Secondary | ICD-10-CM | POA: Diagnosis not present

## 2017-05-14 DIAGNOSIS — D631 Anemia in chronic kidney disease: Secondary | ICD-10-CM | POA: Diagnosis not present

## 2017-05-15 DIAGNOSIS — T8612 Kidney transplant failure: Secondary | ICD-10-CM | POA: Diagnosis not present

## 2017-05-15 DIAGNOSIS — Z992 Dependence on renal dialysis: Secondary | ICD-10-CM | POA: Diagnosis not present

## 2017-05-15 DIAGNOSIS — N186 End stage renal disease: Secondary | ICD-10-CM | POA: Diagnosis not present

## 2017-05-16 DIAGNOSIS — N186 End stage renal disease: Secondary | ICD-10-CM | POA: Diagnosis not present

## 2017-05-16 DIAGNOSIS — N2581 Secondary hyperparathyroidism of renal origin: Secondary | ICD-10-CM | POA: Diagnosis not present

## 2017-05-16 DIAGNOSIS — D631 Anemia in chronic kidney disease: Secondary | ICD-10-CM | POA: Diagnosis not present

## 2017-05-16 DIAGNOSIS — D509 Iron deficiency anemia, unspecified: Secondary | ICD-10-CM | POA: Diagnosis not present

## 2017-05-19 DIAGNOSIS — N186 End stage renal disease: Secondary | ICD-10-CM | POA: Diagnosis not present

## 2017-05-19 DIAGNOSIS — D509 Iron deficiency anemia, unspecified: Secondary | ICD-10-CM | POA: Diagnosis not present

## 2017-05-19 DIAGNOSIS — N2581 Secondary hyperparathyroidism of renal origin: Secondary | ICD-10-CM | POA: Diagnosis not present

## 2017-05-19 DIAGNOSIS — D631 Anemia in chronic kidney disease: Secondary | ICD-10-CM | POA: Diagnosis not present

## 2017-05-21 DIAGNOSIS — D509 Iron deficiency anemia, unspecified: Secondary | ICD-10-CM | POA: Diagnosis not present

## 2017-05-21 DIAGNOSIS — D631 Anemia in chronic kidney disease: Secondary | ICD-10-CM | POA: Diagnosis not present

## 2017-05-21 DIAGNOSIS — N186 End stage renal disease: Secondary | ICD-10-CM | POA: Diagnosis not present

## 2017-05-21 DIAGNOSIS — N2581 Secondary hyperparathyroidism of renal origin: Secondary | ICD-10-CM | POA: Diagnosis not present

## 2017-05-23 DIAGNOSIS — D631 Anemia in chronic kidney disease: Secondary | ICD-10-CM | POA: Diagnosis not present

## 2017-05-23 DIAGNOSIS — N186 End stage renal disease: Secondary | ICD-10-CM | POA: Diagnosis not present

## 2017-05-23 DIAGNOSIS — N2581 Secondary hyperparathyroidism of renal origin: Secondary | ICD-10-CM | POA: Diagnosis not present

## 2017-05-23 DIAGNOSIS — D509 Iron deficiency anemia, unspecified: Secondary | ICD-10-CM | POA: Diagnosis not present

## 2017-05-26 DIAGNOSIS — N186 End stage renal disease: Secondary | ICD-10-CM | POA: Diagnosis not present

## 2017-05-26 DIAGNOSIS — D509 Iron deficiency anemia, unspecified: Secondary | ICD-10-CM | POA: Diagnosis not present

## 2017-05-26 DIAGNOSIS — D631 Anemia in chronic kidney disease: Secondary | ICD-10-CM | POA: Diagnosis not present

## 2017-05-26 DIAGNOSIS — N2581 Secondary hyperparathyroidism of renal origin: Secondary | ICD-10-CM | POA: Diagnosis not present

## 2017-05-28 DIAGNOSIS — D509 Iron deficiency anemia, unspecified: Secondary | ICD-10-CM | POA: Diagnosis not present

## 2017-05-28 DIAGNOSIS — N186 End stage renal disease: Secondary | ICD-10-CM | POA: Diagnosis not present

## 2017-05-28 DIAGNOSIS — N2581 Secondary hyperparathyroidism of renal origin: Secondary | ICD-10-CM | POA: Diagnosis not present

## 2017-05-28 DIAGNOSIS — D631 Anemia in chronic kidney disease: Secondary | ICD-10-CM | POA: Diagnosis not present

## 2017-05-30 DIAGNOSIS — D509 Iron deficiency anemia, unspecified: Secondary | ICD-10-CM | POA: Diagnosis not present

## 2017-05-30 DIAGNOSIS — D631 Anemia in chronic kidney disease: Secondary | ICD-10-CM | POA: Diagnosis not present

## 2017-05-30 DIAGNOSIS — N2581 Secondary hyperparathyroidism of renal origin: Secondary | ICD-10-CM | POA: Diagnosis not present

## 2017-05-30 DIAGNOSIS — N186 End stage renal disease: Secondary | ICD-10-CM | POA: Diagnosis not present

## 2017-06-02 DIAGNOSIS — N2581 Secondary hyperparathyroidism of renal origin: Secondary | ICD-10-CM | POA: Diagnosis not present

## 2017-06-02 DIAGNOSIS — D631 Anemia in chronic kidney disease: Secondary | ICD-10-CM | POA: Diagnosis not present

## 2017-06-02 DIAGNOSIS — D509 Iron deficiency anemia, unspecified: Secondary | ICD-10-CM | POA: Diagnosis not present

## 2017-06-02 DIAGNOSIS — N186 End stage renal disease: Secondary | ICD-10-CM | POA: Diagnosis not present

## 2017-06-04 DIAGNOSIS — N2581 Secondary hyperparathyroidism of renal origin: Secondary | ICD-10-CM | POA: Diagnosis not present

## 2017-06-04 DIAGNOSIS — D509 Iron deficiency anemia, unspecified: Secondary | ICD-10-CM | POA: Diagnosis not present

## 2017-06-04 DIAGNOSIS — N186 End stage renal disease: Secondary | ICD-10-CM | POA: Diagnosis not present

## 2017-06-04 DIAGNOSIS — D631 Anemia in chronic kidney disease: Secondary | ICD-10-CM | POA: Diagnosis not present

## 2017-06-06 DIAGNOSIS — N186 End stage renal disease: Secondary | ICD-10-CM | POA: Diagnosis not present

## 2017-06-06 DIAGNOSIS — D631 Anemia in chronic kidney disease: Secondary | ICD-10-CM | POA: Diagnosis not present

## 2017-06-06 DIAGNOSIS — D509 Iron deficiency anemia, unspecified: Secondary | ICD-10-CM | POA: Diagnosis not present

## 2017-06-06 DIAGNOSIS — N2581 Secondary hyperparathyroidism of renal origin: Secondary | ICD-10-CM | POA: Diagnosis not present

## 2017-06-08 DIAGNOSIS — D509 Iron deficiency anemia, unspecified: Secondary | ICD-10-CM | POA: Diagnosis not present

## 2017-06-08 DIAGNOSIS — N2581 Secondary hyperparathyroidism of renal origin: Secondary | ICD-10-CM | POA: Diagnosis not present

## 2017-06-08 DIAGNOSIS — D631 Anemia in chronic kidney disease: Secondary | ICD-10-CM | POA: Diagnosis not present

## 2017-06-08 DIAGNOSIS — N186 End stage renal disease: Secondary | ICD-10-CM | POA: Diagnosis not present

## 2017-06-11 DIAGNOSIS — D509 Iron deficiency anemia, unspecified: Secondary | ICD-10-CM | POA: Diagnosis not present

## 2017-06-11 DIAGNOSIS — D631 Anemia in chronic kidney disease: Secondary | ICD-10-CM | POA: Diagnosis not present

## 2017-06-11 DIAGNOSIS — N186 End stage renal disease: Secondary | ICD-10-CM | POA: Diagnosis not present

## 2017-06-11 DIAGNOSIS — N2581 Secondary hyperparathyroidism of renal origin: Secondary | ICD-10-CM | POA: Diagnosis not present

## 2017-06-13 DIAGNOSIS — N2581 Secondary hyperparathyroidism of renal origin: Secondary | ICD-10-CM | POA: Diagnosis not present

## 2017-06-13 DIAGNOSIS — D631 Anemia in chronic kidney disease: Secondary | ICD-10-CM | POA: Diagnosis not present

## 2017-06-13 DIAGNOSIS — D509 Iron deficiency anemia, unspecified: Secondary | ICD-10-CM | POA: Diagnosis not present

## 2017-06-13 DIAGNOSIS — N186 End stage renal disease: Secondary | ICD-10-CM | POA: Diagnosis not present

## 2017-06-15 DIAGNOSIS — Z992 Dependence on renal dialysis: Secondary | ICD-10-CM | POA: Diagnosis not present

## 2017-06-15 DIAGNOSIS — D631 Anemia in chronic kidney disease: Secondary | ICD-10-CM | POA: Diagnosis not present

## 2017-06-15 DIAGNOSIS — D509 Iron deficiency anemia, unspecified: Secondary | ICD-10-CM | POA: Diagnosis not present

## 2017-06-15 DIAGNOSIS — T8612 Kidney transplant failure: Secondary | ICD-10-CM | POA: Diagnosis not present

## 2017-06-15 DIAGNOSIS — N2581 Secondary hyperparathyroidism of renal origin: Secondary | ICD-10-CM | POA: Diagnosis not present

## 2017-06-15 DIAGNOSIS — N186 End stage renal disease: Secondary | ICD-10-CM | POA: Diagnosis not present

## 2017-06-18 DIAGNOSIS — N186 End stage renal disease: Secondary | ICD-10-CM | POA: Diagnosis not present

## 2017-06-18 DIAGNOSIS — N2581 Secondary hyperparathyroidism of renal origin: Secondary | ICD-10-CM | POA: Diagnosis not present

## 2017-06-18 DIAGNOSIS — D509 Iron deficiency anemia, unspecified: Secondary | ICD-10-CM | POA: Diagnosis not present

## 2017-06-18 DIAGNOSIS — D631 Anemia in chronic kidney disease: Secondary | ICD-10-CM | POA: Diagnosis not present

## 2017-06-20 DIAGNOSIS — D631 Anemia in chronic kidney disease: Secondary | ICD-10-CM | POA: Diagnosis not present

## 2017-06-20 DIAGNOSIS — D509 Iron deficiency anemia, unspecified: Secondary | ICD-10-CM | POA: Diagnosis not present

## 2017-06-20 DIAGNOSIS — N186 End stage renal disease: Secondary | ICD-10-CM | POA: Diagnosis not present

## 2017-06-20 DIAGNOSIS — N2581 Secondary hyperparathyroidism of renal origin: Secondary | ICD-10-CM | POA: Diagnosis not present

## 2017-06-23 DIAGNOSIS — N2581 Secondary hyperparathyroidism of renal origin: Secondary | ICD-10-CM | POA: Diagnosis not present

## 2017-06-23 DIAGNOSIS — D631 Anemia in chronic kidney disease: Secondary | ICD-10-CM | POA: Diagnosis not present

## 2017-06-23 DIAGNOSIS — N186 End stage renal disease: Secondary | ICD-10-CM | POA: Diagnosis not present

## 2017-06-23 DIAGNOSIS — D509 Iron deficiency anemia, unspecified: Secondary | ICD-10-CM | POA: Diagnosis not present

## 2017-06-25 DIAGNOSIS — N2581 Secondary hyperparathyroidism of renal origin: Secondary | ICD-10-CM | POA: Diagnosis not present

## 2017-06-25 DIAGNOSIS — N186 End stage renal disease: Secondary | ICD-10-CM | POA: Diagnosis not present

## 2017-06-25 DIAGNOSIS — D631 Anemia in chronic kidney disease: Secondary | ICD-10-CM | POA: Diagnosis not present

## 2017-06-25 DIAGNOSIS — D509 Iron deficiency anemia, unspecified: Secondary | ICD-10-CM | POA: Diagnosis not present

## 2017-06-27 DIAGNOSIS — D509 Iron deficiency anemia, unspecified: Secondary | ICD-10-CM | POA: Diagnosis not present

## 2017-06-27 DIAGNOSIS — N2581 Secondary hyperparathyroidism of renal origin: Secondary | ICD-10-CM | POA: Diagnosis not present

## 2017-06-27 DIAGNOSIS — D631 Anemia in chronic kidney disease: Secondary | ICD-10-CM | POA: Diagnosis not present

## 2017-06-27 DIAGNOSIS — N186 End stage renal disease: Secondary | ICD-10-CM | POA: Diagnosis not present

## 2017-06-30 ENCOUNTER — Encounter: Admit: 2017-06-30 | Discharge: 2017-06-30 | Payer: MEDICARE | Attending: Nephrology | Primary: Nephrology

## 2017-06-30 DIAGNOSIS — N186 End stage renal disease: Secondary | ICD-10-CM | POA: Diagnosis not present

## 2017-06-30 DIAGNOSIS — D631 Anemia in chronic kidney disease: Secondary | ICD-10-CM | POA: Diagnosis not present

## 2017-06-30 DIAGNOSIS — D509 Iron deficiency anemia, unspecified: Secondary | ICD-10-CM | POA: Diagnosis not present

## 2017-06-30 DIAGNOSIS — N2581 Secondary hyperparathyroidism of renal origin: Secondary | ICD-10-CM | POA: Diagnosis not present

## 2017-07-02 DIAGNOSIS — N186 End stage renal disease: Secondary | ICD-10-CM | POA: Diagnosis not present

## 2017-07-02 DIAGNOSIS — N2581 Secondary hyperparathyroidism of renal origin: Secondary | ICD-10-CM | POA: Diagnosis not present

## 2017-07-02 DIAGNOSIS — D509 Iron deficiency anemia, unspecified: Secondary | ICD-10-CM | POA: Diagnosis not present

## 2017-07-02 DIAGNOSIS — D631 Anemia in chronic kidney disease: Secondary | ICD-10-CM | POA: Diagnosis not present

## 2017-07-04 DIAGNOSIS — N186 End stage renal disease: Secondary | ICD-10-CM | POA: Diagnosis not present

## 2017-07-04 DIAGNOSIS — D631 Anemia in chronic kidney disease: Secondary | ICD-10-CM | POA: Diagnosis not present

## 2017-07-04 DIAGNOSIS — D509 Iron deficiency anemia, unspecified: Secondary | ICD-10-CM | POA: Diagnosis not present

## 2017-07-04 DIAGNOSIS — N2581 Secondary hyperparathyroidism of renal origin: Secondary | ICD-10-CM | POA: Diagnosis not present

## 2017-07-07 DIAGNOSIS — N186 End stage renal disease: Secondary | ICD-10-CM

## 2017-07-07 DIAGNOSIS — Z7682 Awaiting organ transplant status: Principal | ICD-10-CM

## 2017-07-07 DIAGNOSIS — Z01818 Encounter for other preprocedural examination: Secondary | ICD-10-CM

## 2017-07-07 DIAGNOSIS — D509 Iron deficiency anemia, unspecified: Secondary | ICD-10-CM | POA: Diagnosis not present

## 2017-07-07 DIAGNOSIS — N2581 Secondary hyperparathyroidism of renal origin: Secondary | ICD-10-CM | POA: Diagnosis not present

## 2017-07-07 DIAGNOSIS — D631 Anemia in chronic kidney disease: Secondary | ICD-10-CM | POA: Diagnosis not present

## 2017-07-09 DIAGNOSIS — D509 Iron deficiency anemia, unspecified: Secondary | ICD-10-CM | POA: Diagnosis not present

## 2017-07-09 DIAGNOSIS — N2581 Secondary hyperparathyroidism of renal origin: Secondary | ICD-10-CM | POA: Diagnosis not present

## 2017-07-09 DIAGNOSIS — D631 Anemia in chronic kidney disease: Secondary | ICD-10-CM | POA: Diagnosis not present

## 2017-07-09 DIAGNOSIS — N186 End stage renal disease: Secondary | ICD-10-CM | POA: Diagnosis not present

## 2017-07-11 DIAGNOSIS — D509 Iron deficiency anemia, unspecified: Secondary | ICD-10-CM | POA: Diagnosis not present

## 2017-07-11 DIAGNOSIS — D631 Anemia in chronic kidney disease: Secondary | ICD-10-CM | POA: Diagnosis not present

## 2017-07-11 DIAGNOSIS — N2581 Secondary hyperparathyroidism of renal origin: Secondary | ICD-10-CM | POA: Diagnosis not present

## 2017-07-11 DIAGNOSIS — N186 End stage renal disease: Secondary | ICD-10-CM | POA: Diagnosis not present

## 2017-07-14 DIAGNOSIS — N186 End stage renal disease: Secondary | ICD-10-CM | POA: Diagnosis not present

## 2017-07-14 DIAGNOSIS — D631 Anemia in chronic kidney disease: Secondary | ICD-10-CM | POA: Diagnosis not present

## 2017-07-14 DIAGNOSIS — N2581 Secondary hyperparathyroidism of renal origin: Secondary | ICD-10-CM | POA: Diagnosis not present

## 2017-07-14 DIAGNOSIS — D509 Iron deficiency anemia, unspecified: Secondary | ICD-10-CM | POA: Diagnosis not present

## 2017-07-16 DIAGNOSIS — N186 End stage renal disease: Secondary | ICD-10-CM | POA: Diagnosis not present

## 2017-07-16 DIAGNOSIS — D631 Anemia in chronic kidney disease: Secondary | ICD-10-CM | POA: Diagnosis not present

## 2017-07-16 DIAGNOSIS — T8612 Kidney transplant failure: Secondary | ICD-10-CM | POA: Diagnosis not present

## 2017-07-16 DIAGNOSIS — N2581 Secondary hyperparathyroidism of renal origin: Secondary | ICD-10-CM | POA: Diagnosis not present

## 2017-07-16 DIAGNOSIS — D509 Iron deficiency anemia, unspecified: Secondary | ICD-10-CM | POA: Diagnosis not present

## 2017-07-16 DIAGNOSIS — Z992 Dependence on renal dialysis: Secondary | ICD-10-CM | POA: Diagnosis not present

## 2017-07-17 DIAGNOSIS — T8612 Kidney transplant failure: Secondary | ICD-10-CM | POA: Diagnosis not present

## 2017-07-17 DIAGNOSIS — Z992 Dependence on renal dialysis: Secondary | ICD-10-CM | POA: Diagnosis not present

## 2017-07-17 DIAGNOSIS — N186 End stage renal disease: Secondary | ICD-10-CM | POA: Diagnosis not present

## 2017-07-18 DIAGNOSIS — N186 End stage renal disease: Secondary | ICD-10-CM | POA: Diagnosis not present

## 2017-07-18 DIAGNOSIS — D631 Anemia in chronic kidney disease: Secondary | ICD-10-CM | POA: Diagnosis not present

## 2017-07-18 DIAGNOSIS — D509 Iron deficiency anemia, unspecified: Secondary | ICD-10-CM | POA: Diagnosis not present

## 2017-07-18 DIAGNOSIS — N2581 Secondary hyperparathyroidism of renal origin: Secondary | ICD-10-CM | POA: Diagnosis not present

## 2017-07-21 DIAGNOSIS — D631 Anemia in chronic kidney disease: Secondary | ICD-10-CM | POA: Diagnosis not present

## 2017-07-21 DIAGNOSIS — D509 Iron deficiency anemia, unspecified: Secondary | ICD-10-CM | POA: Diagnosis not present

## 2017-07-21 DIAGNOSIS — N186 End stage renal disease: Secondary | ICD-10-CM | POA: Diagnosis not present

## 2017-07-21 DIAGNOSIS — N2581 Secondary hyperparathyroidism of renal origin: Secondary | ICD-10-CM | POA: Diagnosis not present

## 2017-07-23 DIAGNOSIS — D631 Anemia in chronic kidney disease: Secondary | ICD-10-CM | POA: Diagnosis not present

## 2017-07-23 DIAGNOSIS — D509 Iron deficiency anemia, unspecified: Secondary | ICD-10-CM | POA: Diagnosis not present

## 2017-07-23 DIAGNOSIS — N2581 Secondary hyperparathyroidism of renal origin: Secondary | ICD-10-CM | POA: Diagnosis not present

## 2017-07-23 DIAGNOSIS — N186 End stage renal disease: Secondary | ICD-10-CM | POA: Diagnosis not present

## 2017-07-25 DIAGNOSIS — N2581 Secondary hyperparathyroidism of renal origin: Secondary | ICD-10-CM | POA: Diagnosis not present

## 2017-07-25 DIAGNOSIS — D631 Anemia in chronic kidney disease: Secondary | ICD-10-CM | POA: Diagnosis not present

## 2017-07-25 DIAGNOSIS — N186 End stage renal disease: Secondary | ICD-10-CM | POA: Diagnosis not present

## 2017-07-25 DIAGNOSIS — D509 Iron deficiency anemia, unspecified: Secondary | ICD-10-CM | POA: Diagnosis not present

## 2017-07-28 DIAGNOSIS — D509 Iron deficiency anemia, unspecified: Secondary | ICD-10-CM | POA: Diagnosis not present

## 2017-07-28 DIAGNOSIS — N2581 Secondary hyperparathyroidism of renal origin: Secondary | ICD-10-CM | POA: Diagnosis not present

## 2017-07-28 DIAGNOSIS — N186 End stage renal disease: Secondary | ICD-10-CM | POA: Diagnosis not present

## 2017-07-28 DIAGNOSIS — D631 Anemia in chronic kidney disease: Secondary | ICD-10-CM | POA: Diagnosis not present

## 2017-07-30 DIAGNOSIS — N2581 Secondary hyperparathyroidism of renal origin: Secondary | ICD-10-CM | POA: Diagnosis not present

## 2017-07-30 DIAGNOSIS — D631 Anemia in chronic kidney disease: Secondary | ICD-10-CM | POA: Diagnosis not present

## 2017-07-30 DIAGNOSIS — D509 Iron deficiency anemia, unspecified: Secondary | ICD-10-CM | POA: Diagnosis not present

## 2017-07-30 DIAGNOSIS — N186 End stage renal disease: Secondary | ICD-10-CM | POA: Diagnosis not present

## 2017-08-01 DIAGNOSIS — D509 Iron deficiency anemia, unspecified: Secondary | ICD-10-CM | POA: Diagnosis not present

## 2017-08-01 DIAGNOSIS — N186 End stage renal disease: Secondary | ICD-10-CM | POA: Diagnosis not present

## 2017-08-01 DIAGNOSIS — D631 Anemia in chronic kidney disease: Secondary | ICD-10-CM | POA: Diagnosis not present

## 2017-08-01 DIAGNOSIS — N2581 Secondary hyperparathyroidism of renal origin: Secondary | ICD-10-CM | POA: Diagnosis not present

## 2017-08-04 ENCOUNTER — Encounter: Admit: 2017-08-04 | Discharge: 2017-08-04 | Payer: MEDICARE | Attending: Nephrology | Primary: Nephrology

## 2017-08-04 DIAGNOSIS — D509 Iron deficiency anemia, unspecified: Secondary | ICD-10-CM | POA: Diagnosis not present

## 2017-08-04 DIAGNOSIS — N2581 Secondary hyperparathyroidism of renal origin: Secondary | ICD-10-CM | POA: Diagnosis not present

## 2017-08-04 DIAGNOSIS — D631 Anemia in chronic kidney disease: Secondary | ICD-10-CM | POA: Diagnosis not present

## 2017-08-04 DIAGNOSIS — N186 End stage renal disease: Secondary | ICD-10-CM | POA: Diagnosis not present

## 2017-08-06 DIAGNOSIS — N186 End stage renal disease: Secondary | ICD-10-CM | POA: Diagnosis not present

## 2017-08-06 DIAGNOSIS — D509 Iron deficiency anemia, unspecified: Secondary | ICD-10-CM | POA: Diagnosis not present

## 2017-08-06 DIAGNOSIS — D631 Anemia in chronic kidney disease: Secondary | ICD-10-CM | POA: Diagnosis not present

## 2017-08-06 DIAGNOSIS — N2581 Secondary hyperparathyroidism of renal origin: Secondary | ICD-10-CM | POA: Diagnosis not present

## 2017-08-08 DIAGNOSIS — N2581 Secondary hyperparathyroidism of renal origin: Secondary | ICD-10-CM | POA: Diagnosis not present

## 2017-08-08 DIAGNOSIS — D509 Iron deficiency anemia, unspecified: Secondary | ICD-10-CM | POA: Diagnosis not present

## 2017-08-08 DIAGNOSIS — D631 Anemia in chronic kidney disease: Secondary | ICD-10-CM | POA: Diagnosis not present

## 2017-08-08 DIAGNOSIS — N186 End stage renal disease: Secondary | ICD-10-CM | POA: Diagnosis not present

## 2017-08-11 DIAGNOSIS — Z7682 Awaiting organ transplant status: Principal | ICD-10-CM

## 2017-08-11 DIAGNOSIS — N186 End stage renal disease: Secondary | ICD-10-CM

## 2017-08-11 DIAGNOSIS — Z01818 Encounter for other preprocedural examination: Secondary | ICD-10-CM

## 2017-08-11 DIAGNOSIS — D509 Iron deficiency anemia, unspecified: Secondary | ICD-10-CM | POA: Diagnosis not present

## 2017-08-11 DIAGNOSIS — D631 Anemia in chronic kidney disease: Secondary | ICD-10-CM | POA: Diagnosis not present

## 2017-08-11 DIAGNOSIS — N2581 Secondary hyperparathyroidism of renal origin: Secondary | ICD-10-CM | POA: Diagnosis not present

## 2017-08-13 DIAGNOSIS — D509 Iron deficiency anemia, unspecified: Secondary | ICD-10-CM | POA: Diagnosis not present

## 2017-08-13 DIAGNOSIS — N186 End stage renal disease: Secondary | ICD-10-CM | POA: Diagnosis not present

## 2017-08-13 DIAGNOSIS — D631 Anemia in chronic kidney disease: Secondary | ICD-10-CM | POA: Diagnosis not present

## 2017-08-13 DIAGNOSIS — N2581 Secondary hyperparathyroidism of renal origin: Secondary | ICD-10-CM | POA: Diagnosis not present

## 2017-08-14 DIAGNOSIS — T8612 Kidney transplant failure: Secondary | ICD-10-CM | POA: Diagnosis not present

## 2017-08-14 DIAGNOSIS — N186 End stage renal disease: Secondary | ICD-10-CM | POA: Diagnosis not present

## 2017-08-14 DIAGNOSIS — Z992 Dependence on renal dialysis: Secondary | ICD-10-CM | POA: Diagnosis not present

## 2017-08-15 DIAGNOSIS — D631 Anemia in chronic kidney disease: Secondary | ICD-10-CM | POA: Diagnosis not present

## 2017-08-15 DIAGNOSIS — D509 Iron deficiency anemia, unspecified: Secondary | ICD-10-CM | POA: Diagnosis not present

## 2017-08-15 DIAGNOSIS — N2581 Secondary hyperparathyroidism of renal origin: Secondary | ICD-10-CM | POA: Diagnosis not present

## 2017-08-15 DIAGNOSIS — N186 End stage renal disease: Secondary | ICD-10-CM | POA: Diagnosis not present

## 2017-08-18 DIAGNOSIS — D631 Anemia in chronic kidney disease: Secondary | ICD-10-CM | POA: Diagnosis not present

## 2017-08-18 DIAGNOSIS — D509 Iron deficiency anemia, unspecified: Secondary | ICD-10-CM | POA: Diagnosis not present

## 2017-08-18 DIAGNOSIS — N186 End stage renal disease: Secondary | ICD-10-CM | POA: Diagnosis not present

## 2017-08-18 DIAGNOSIS — N2581 Secondary hyperparathyroidism of renal origin: Secondary | ICD-10-CM | POA: Diagnosis not present

## 2017-08-20 DIAGNOSIS — D631 Anemia in chronic kidney disease: Secondary | ICD-10-CM | POA: Diagnosis not present

## 2017-08-20 DIAGNOSIS — D509 Iron deficiency anemia, unspecified: Secondary | ICD-10-CM | POA: Diagnosis not present

## 2017-08-20 DIAGNOSIS — N2581 Secondary hyperparathyroidism of renal origin: Secondary | ICD-10-CM | POA: Diagnosis not present

## 2017-08-20 DIAGNOSIS — N186 End stage renal disease: Secondary | ICD-10-CM | POA: Diagnosis not present

## 2017-08-22 DIAGNOSIS — D509 Iron deficiency anemia, unspecified: Secondary | ICD-10-CM | POA: Diagnosis not present

## 2017-08-22 DIAGNOSIS — N2581 Secondary hyperparathyroidism of renal origin: Secondary | ICD-10-CM | POA: Diagnosis not present

## 2017-08-22 DIAGNOSIS — D631 Anemia in chronic kidney disease: Secondary | ICD-10-CM | POA: Diagnosis not present

## 2017-08-22 DIAGNOSIS — N186 End stage renal disease: Secondary | ICD-10-CM | POA: Diagnosis not present

## 2017-08-25 DIAGNOSIS — N2581 Secondary hyperparathyroidism of renal origin: Secondary | ICD-10-CM | POA: Diagnosis not present

## 2017-08-25 DIAGNOSIS — N186 End stage renal disease: Secondary | ICD-10-CM | POA: Diagnosis not present

## 2017-08-25 DIAGNOSIS — D631 Anemia in chronic kidney disease: Secondary | ICD-10-CM | POA: Diagnosis not present

## 2017-08-25 DIAGNOSIS — D509 Iron deficiency anemia, unspecified: Secondary | ICD-10-CM | POA: Diagnosis not present

## 2017-08-27 DIAGNOSIS — D509 Iron deficiency anemia, unspecified: Secondary | ICD-10-CM | POA: Diagnosis not present

## 2017-08-27 DIAGNOSIS — N186 End stage renal disease: Secondary | ICD-10-CM | POA: Diagnosis not present

## 2017-08-27 DIAGNOSIS — D631 Anemia in chronic kidney disease: Secondary | ICD-10-CM | POA: Diagnosis not present

## 2017-08-27 DIAGNOSIS — N2581 Secondary hyperparathyroidism of renal origin: Secondary | ICD-10-CM | POA: Diagnosis not present

## 2017-08-29 DIAGNOSIS — D509 Iron deficiency anemia, unspecified: Secondary | ICD-10-CM | POA: Diagnosis not present

## 2017-08-29 DIAGNOSIS — D631 Anemia in chronic kidney disease: Secondary | ICD-10-CM | POA: Diagnosis not present

## 2017-08-29 DIAGNOSIS — N186 End stage renal disease: Secondary | ICD-10-CM | POA: Diagnosis not present

## 2017-08-29 DIAGNOSIS — N2581 Secondary hyperparathyroidism of renal origin: Secondary | ICD-10-CM | POA: Diagnosis not present

## 2017-09-01 ENCOUNTER — Encounter: Admit: 2017-09-01 | Discharge: 2017-09-01 | Payer: MEDICARE | Attending: Nephrology | Primary: Nephrology

## 2017-09-01 DIAGNOSIS — D631 Anemia in chronic kidney disease: Secondary | ICD-10-CM | POA: Diagnosis not present

## 2017-09-01 DIAGNOSIS — D509 Iron deficiency anemia, unspecified: Secondary | ICD-10-CM | POA: Diagnosis not present

## 2017-09-01 DIAGNOSIS — N186 End stage renal disease: Secondary | ICD-10-CM | POA: Diagnosis not present

## 2017-09-01 DIAGNOSIS — N2581 Secondary hyperparathyroidism of renal origin: Secondary | ICD-10-CM | POA: Diagnosis not present

## 2017-09-03 DIAGNOSIS — D509 Iron deficiency anemia, unspecified: Secondary | ICD-10-CM | POA: Diagnosis not present

## 2017-09-03 DIAGNOSIS — N2581 Secondary hyperparathyroidism of renal origin: Secondary | ICD-10-CM | POA: Diagnosis not present

## 2017-09-03 DIAGNOSIS — N186 End stage renal disease: Secondary | ICD-10-CM | POA: Diagnosis not present

## 2017-09-03 DIAGNOSIS — D631 Anemia in chronic kidney disease: Secondary | ICD-10-CM | POA: Diagnosis not present

## 2017-09-04 DIAGNOSIS — Z01818 Encounter for other preprocedural examination: Secondary | ICD-10-CM

## 2017-09-04 DIAGNOSIS — Z7682 Awaiting organ transplant status: Principal | ICD-10-CM

## 2017-09-04 DIAGNOSIS — N186 End stage renal disease: Secondary | ICD-10-CM

## 2017-09-05 DIAGNOSIS — D631 Anemia in chronic kidney disease: Secondary | ICD-10-CM | POA: Diagnosis not present

## 2017-09-05 DIAGNOSIS — N186 End stage renal disease: Secondary | ICD-10-CM | POA: Diagnosis not present

## 2017-09-05 DIAGNOSIS — D509 Iron deficiency anemia, unspecified: Secondary | ICD-10-CM | POA: Diagnosis not present

## 2017-09-05 DIAGNOSIS — N2581 Secondary hyperparathyroidism of renal origin: Secondary | ICD-10-CM | POA: Diagnosis not present

## 2017-09-08 DIAGNOSIS — N2581 Secondary hyperparathyroidism of renal origin: Secondary | ICD-10-CM | POA: Diagnosis not present

## 2017-09-08 DIAGNOSIS — D631 Anemia in chronic kidney disease: Secondary | ICD-10-CM | POA: Diagnosis not present

## 2017-09-08 DIAGNOSIS — D509 Iron deficiency anemia, unspecified: Secondary | ICD-10-CM | POA: Diagnosis not present

## 2017-09-08 DIAGNOSIS — N186 End stage renal disease: Secondary | ICD-10-CM | POA: Diagnosis not present

## 2017-09-10 DIAGNOSIS — D631 Anemia in chronic kidney disease: Secondary | ICD-10-CM | POA: Diagnosis not present

## 2017-09-10 DIAGNOSIS — N2581 Secondary hyperparathyroidism of renal origin: Secondary | ICD-10-CM | POA: Diagnosis not present

## 2017-09-10 DIAGNOSIS — N186 End stage renal disease: Secondary | ICD-10-CM | POA: Diagnosis not present

## 2017-09-10 DIAGNOSIS — D509 Iron deficiency anemia, unspecified: Secondary | ICD-10-CM | POA: Diagnosis not present

## 2017-09-12 DIAGNOSIS — N186 End stage renal disease: Secondary | ICD-10-CM | POA: Diagnosis not present

## 2017-09-12 DIAGNOSIS — D631 Anemia in chronic kidney disease: Secondary | ICD-10-CM | POA: Diagnosis not present

## 2017-09-12 DIAGNOSIS — N2581 Secondary hyperparathyroidism of renal origin: Secondary | ICD-10-CM | POA: Diagnosis not present

## 2017-09-12 DIAGNOSIS — D509 Iron deficiency anemia, unspecified: Secondary | ICD-10-CM | POA: Diagnosis not present

## 2017-09-14 DIAGNOSIS — Z992 Dependence on renal dialysis: Secondary | ICD-10-CM | POA: Diagnosis not present

## 2017-09-14 DIAGNOSIS — N186 End stage renal disease: Secondary | ICD-10-CM | POA: Diagnosis not present

## 2017-09-14 DIAGNOSIS — T8612 Kidney transplant failure: Secondary | ICD-10-CM | POA: Diagnosis not present

## 2017-09-15 DIAGNOSIS — N2581 Secondary hyperparathyroidism of renal origin: Secondary | ICD-10-CM | POA: Diagnosis not present

## 2017-09-15 DIAGNOSIS — D509 Iron deficiency anemia, unspecified: Secondary | ICD-10-CM | POA: Diagnosis not present

## 2017-09-15 DIAGNOSIS — N186 End stage renal disease: Secondary | ICD-10-CM | POA: Diagnosis not present

## 2017-09-15 DIAGNOSIS — D631 Anemia in chronic kidney disease: Secondary | ICD-10-CM | POA: Diagnosis not present

## 2017-09-17 DIAGNOSIS — D631 Anemia in chronic kidney disease: Secondary | ICD-10-CM | POA: Diagnosis not present

## 2017-09-17 DIAGNOSIS — N2581 Secondary hyperparathyroidism of renal origin: Secondary | ICD-10-CM | POA: Diagnosis not present

## 2017-09-17 DIAGNOSIS — N186 End stage renal disease: Secondary | ICD-10-CM | POA: Diagnosis not present

## 2017-09-17 DIAGNOSIS — D509 Iron deficiency anemia, unspecified: Secondary | ICD-10-CM | POA: Diagnosis not present

## 2017-09-19 DIAGNOSIS — N186 End stage renal disease: Secondary | ICD-10-CM | POA: Diagnosis not present

## 2017-09-19 DIAGNOSIS — D631 Anemia in chronic kidney disease: Secondary | ICD-10-CM | POA: Diagnosis not present

## 2017-09-19 DIAGNOSIS — N2581 Secondary hyperparathyroidism of renal origin: Secondary | ICD-10-CM | POA: Diagnosis not present

## 2017-09-19 DIAGNOSIS — D509 Iron deficiency anemia, unspecified: Secondary | ICD-10-CM | POA: Diagnosis not present

## 2017-09-22 ENCOUNTER — Encounter: Admit: 2017-09-22 | Discharge: 2017-09-22 | Payer: MEDICARE | Attending: Nephrology | Primary: Nephrology

## 2017-09-22 DIAGNOSIS — D509 Iron deficiency anemia, unspecified: Secondary | ICD-10-CM | POA: Diagnosis not present

## 2017-09-22 DIAGNOSIS — D631 Anemia in chronic kidney disease: Secondary | ICD-10-CM | POA: Diagnosis not present

## 2017-09-22 DIAGNOSIS — N186 End stage renal disease: Secondary | ICD-10-CM | POA: Diagnosis not present

## 2017-09-22 DIAGNOSIS — N2581 Secondary hyperparathyroidism of renal origin: Secondary | ICD-10-CM | POA: Diagnosis not present

## 2017-09-24 DIAGNOSIS — N2581 Secondary hyperparathyroidism of renal origin: Secondary | ICD-10-CM | POA: Diagnosis not present

## 2017-09-24 DIAGNOSIS — D631 Anemia in chronic kidney disease: Secondary | ICD-10-CM | POA: Diagnosis not present

## 2017-09-24 DIAGNOSIS — N186 End stage renal disease: Secondary | ICD-10-CM | POA: Diagnosis not present

## 2017-09-24 DIAGNOSIS — D509 Iron deficiency anemia, unspecified: Secondary | ICD-10-CM | POA: Diagnosis not present

## 2017-09-25 DIAGNOSIS — Z01818 Encounter for other preprocedural examination: Secondary | ICD-10-CM

## 2017-09-25 DIAGNOSIS — Z7682 Awaiting organ transplant status: Principal | ICD-10-CM

## 2017-09-25 DIAGNOSIS — N186 End stage renal disease: Secondary | ICD-10-CM

## 2017-09-26 DIAGNOSIS — N186 End stage renal disease: Secondary | ICD-10-CM | POA: Diagnosis not present

## 2017-09-26 DIAGNOSIS — D631 Anemia in chronic kidney disease: Secondary | ICD-10-CM | POA: Diagnosis not present

## 2017-09-26 DIAGNOSIS — D509 Iron deficiency anemia, unspecified: Secondary | ICD-10-CM | POA: Diagnosis not present

## 2017-09-26 DIAGNOSIS — N2581 Secondary hyperparathyroidism of renal origin: Secondary | ICD-10-CM | POA: Diagnosis not present

## 2017-09-29 DIAGNOSIS — D509 Iron deficiency anemia, unspecified: Secondary | ICD-10-CM | POA: Diagnosis not present

## 2017-09-29 DIAGNOSIS — N2581 Secondary hyperparathyroidism of renal origin: Secondary | ICD-10-CM | POA: Diagnosis not present

## 2017-09-29 DIAGNOSIS — D631 Anemia in chronic kidney disease: Secondary | ICD-10-CM | POA: Diagnosis not present

## 2017-09-29 DIAGNOSIS — N186 End stage renal disease: Secondary | ICD-10-CM | POA: Diagnosis not present

## 2017-10-01 DIAGNOSIS — N2581 Secondary hyperparathyroidism of renal origin: Secondary | ICD-10-CM | POA: Diagnosis not present

## 2017-10-01 DIAGNOSIS — D631 Anemia in chronic kidney disease: Secondary | ICD-10-CM | POA: Diagnosis not present

## 2017-10-01 DIAGNOSIS — D509 Iron deficiency anemia, unspecified: Secondary | ICD-10-CM | POA: Diagnosis not present

## 2017-10-01 DIAGNOSIS — N186 End stage renal disease: Secondary | ICD-10-CM | POA: Diagnosis not present

## 2017-10-03 DIAGNOSIS — N2581 Secondary hyperparathyroidism of renal origin: Secondary | ICD-10-CM | POA: Diagnosis not present

## 2017-10-03 DIAGNOSIS — D631 Anemia in chronic kidney disease: Secondary | ICD-10-CM | POA: Diagnosis not present

## 2017-10-03 DIAGNOSIS — N186 End stage renal disease: Secondary | ICD-10-CM | POA: Diagnosis not present

## 2017-10-03 DIAGNOSIS — D509 Iron deficiency anemia, unspecified: Secondary | ICD-10-CM | POA: Diagnosis not present

## 2017-10-06 DIAGNOSIS — D509 Iron deficiency anemia, unspecified: Secondary | ICD-10-CM | POA: Diagnosis not present

## 2017-10-06 DIAGNOSIS — N186 End stage renal disease: Secondary | ICD-10-CM | POA: Diagnosis not present

## 2017-10-06 DIAGNOSIS — N2581 Secondary hyperparathyroidism of renal origin: Secondary | ICD-10-CM | POA: Diagnosis not present

## 2017-10-06 DIAGNOSIS — D631 Anemia in chronic kidney disease: Secondary | ICD-10-CM | POA: Diagnosis not present

## 2017-10-08 DIAGNOSIS — D631 Anemia in chronic kidney disease: Secondary | ICD-10-CM | POA: Diagnosis not present

## 2017-10-08 DIAGNOSIS — N2581 Secondary hyperparathyroidism of renal origin: Secondary | ICD-10-CM | POA: Diagnosis not present

## 2017-10-08 DIAGNOSIS — D509 Iron deficiency anemia, unspecified: Secondary | ICD-10-CM | POA: Diagnosis not present

## 2017-10-08 DIAGNOSIS — N186 End stage renal disease: Secondary | ICD-10-CM | POA: Diagnosis not present

## 2017-10-10 DIAGNOSIS — N186 End stage renal disease: Secondary | ICD-10-CM | POA: Diagnosis not present

## 2017-10-10 DIAGNOSIS — N2581 Secondary hyperparathyroidism of renal origin: Secondary | ICD-10-CM | POA: Diagnosis not present

## 2017-10-10 DIAGNOSIS — D509 Iron deficiency anemia, unspecified: Secondary | ICD-10-CM | POA: Diagnosis not present

## 2017-10-10 DIAGNOSIS — D631 Anemia in chronic kidney disease: Secondary | ICD-10-CM | POA: Diagnosis not present

## 2017-10-13 DIAGNOSIS — D509 Iron deficiency anemia, unspecified: Secondary | ICD-10-CM | POA: Diagnosis not present

## 2017-10-13 DIAGNOSIS — N186 End stage renal disease: Secondary | ICD-10-CM | POA: Diagnosis not present

## 2017-10-13 DIAGNOSIS — N2581 Secondary hyperparathyroidism of renal origin: Secondary | ICD-10-CM | POA: Diagnosis not present

## 2017-10-13 DIAGNOSIS — D631 Anemia in chronic kidney disease: Secondary | ICD-10-CM | POA: Diagnosis not present

## 2017-10-14 DIAGNOSIS — Z992 Dependence on renal dialysis: Secondary | ICD-10-CM | POA: Diagnosis not present

## 2017-10-14 DIAGNOSIS — N186 End stage renal disease: Secondary | ICD-10-CM | POA: Diagnosis not present

## 2017-10-14 DIAGNOSIS — T8612 Kidney transplant failure: Secondary | ICD-10-CM | POA: Diagnosis not present

## 2017-10-15 DIAGNOSIS — N2581 Secondary hyperparathyroidism of renal origin: Secondary | ICD-10-CM | POA: Diagnosis not present

## 2017-10-15 DIAGNOSIS — N186 End stage renal disease: Secondary | ICD-10-CM | POA: Diagnosis not present

## 2017-10-15 DIAGNOSIS — D509 Iron deficiency anemia, unspecified: Secondary | ICD-10-CM | POA: Diagnosis not present

## 2017-10-15 DIAGNOSIS — D631 Anemia in chronic kidney disease: Secondary | ICD-10-CM | POA: Diagnosis not present

## 2017-10-17 DIAGNOSIS — N186 End stage renal disease: Secondary | ICD-10-CM | POA: Diagnosis not present

## 2017-10-17 DIAGNOSIS — N2581 Secondary hyperparathyroidism of renal origin: Secondary | ICD-10-CM | POA: Diagnosis not present

## 2017-10-17 DIAGNOSIS — D631 Anemia in chronic kidney disease: Secondary | ICD-10-CM | POA: Diagnosis not present

## 2017-10-17 DIAGNOSIS — D509 Iron deficiency anemia, unspecified: Secondary | ICD-10-CM | POA: Diagnosis not present

## 2017-10-20 DIAGNOSIS — D631 Anemia in chronic kidney disease: Secondary | ICD-10-CM | POA: Diagnosis not present

## 2017-10-20 DIAGNOSIS — N2581 Secondary hyperparathyroidism of renal origin: Secondary | ICD-10-CM | POA: Diagnosis not present

## 2017-10-20 DIAGNOSIS — D509 Iron deficiency anemia, unspecified: Secondary | ICD-10-CM | POA: Diagnosis not present

## 2017-10-20 DIAGNOSIS — N186 End stage renal disease: Secondary | ICD-10-CM | POA: Diagnosis not present

## 2017-10-22 DIAGNOSIS — N2581 Secondary hyperparathyroidism of renal origin: Secondary | ICD-10-CM | POA: Diagnosis not present

## 2017-10-22 DIAGNOSIS — D631 Anemia in chronic kidney disease: Secondary | ICD-10-CM | POA: Diagnosis not present

## 2017-10-22 DIAGNOSIS — N186 End stage renal disease: Secondary | ICD-10-CM | POA: Diagnosis not present

## 2017-10-22 DIAGNOSIS — D509 Iron deficiency anemia, unspecified: Secondary | ICD-10-CM | POA: Diagnosis not present

## 2017-10-24 DIAGNOSIS — D509 Iron deficiency anemia, unspecified: Secondary | ICD-10-CM | POA: Diagnosis not present

## 2017-10-24 DIAGNOSIS — N186 End stage renal disease: Secondary | ICD-10-CM | POA: Diagnosis not present

## 2017-10-24 DIAGNOSIS — N2581 Secondary hyperparathyroidism of renal origin: Secondary | ICD-10-CM | POA: Diagnosis not present

## 2017-10-24 DIAGNOSIS — D631 Anemia in chronic kidney disease: Secondary | ICD-10-CM | POA: Diagnosis not present

## 2017-10-27 ENCOUNTER — Encounter: Admit: 2017-10-27 | Discharge: 2017-10-27 | Payer: MEDICARE | Attending: Nephrology | Primary: Nephrology

## 2017-10-27 DIAGNOSIS — N186 End stage renal disease: Secondary | ICD-10-CM | POA: Diagnosis not present

## 2017-10-27 DIAGNOSIS — D631 Anemia in chronic kidney disease: Secondary | ICD-10-CM | POA: Diagnosis not present

## 2017-10-27 DIAGNOSIS — N2581 Secondary hyperparathyroidism of renal origin: Secondary | ICD-10-CM | POA: Diagnosis not present

## 2017-10-27 DIAGNOSIS — D509 Iron deficiency anemia, unspecified: Secondary | ICD-10-CM | POA: Diagnosis not present

## 2017-10-29 DIAGNOSIS — N186 End stage renal disease: Secondary | ICD-10-CM | POA: Diagnosis not present

## 2017-10-29 DIAGNOSIS — N2581 Secondary hyperparathyroidism of renal origin: Secondary | ICD-10-CM | POA: Diagnosis not present

## 2017-10-29 DIAGNOSIS — D631 Anemia in chronic kidney disease: Secondary | ICD-10-CM | POA: Diagnosis not present

## 2017-10-29 DIAGNOSIS — D509 Iron deficiency anemia, unspecified: Secondary | ICD-10-CM | POA: Diagnosis not present

## 2017-10-31 DIAGNOSIS — N2581 Secondary hyperparathyroidism of renal origin: Secondary | ICD-10-CM | POA: Diagnosis not present

## 2017-10-31 DIAGNOSIS — D509 Iron deficiency anemia, unspecified: Secondary | ICD-10-CM | POA: Diagnosis not present

## 2017-10-31 DIAGNOSIS — N186 End stage renal disease: Secondary | ICD-10-CM | POA: Diagnosis not present

## 2017-10-31 DIAGNOSIS — D631 Anemia in chronic kidney disease: Secondary | ICD-10-CM | POA: Diagnosis not present

## 2017-11-02 DIAGNOSIS — Z7682 Awaiting organ transplant status: Principal | ICD-10-CM

## 2017-11-02 DIAGNOSIS — Z01818 Encounter for other preprocedural examination: Secondary | ICD-10-CM

## 2017-11-02 DIAGNOSIS — N186 End stage renal disease: Secondary | ICD-10-CM

## 2017-11-03 DIAGNOSIS — N186 End stage renal disease: Secondary | ICD-10-CM | POA: Diagnosis not present

## 2017-11-03 DIAGNOSIS — D631 Anemia in chronic kidney disease: Secondary | ICD-10-CM | POA: Diagnosis not present

## 2017-11-03 DIAGNOSIS — D509 Iron deficiency anemia, unspecified: Secondary | ICD-10-CM | POA: Diagnosis not present

## 2017-11-03 DIAGNOSIS — N2581 Secondary hyperparathyroidism of renal origin: Secondary | ICD-10-CM | POA: Diagnosis not present

## 2017-11-03 NOTE — Unmapped (Signed)
Encounter created in error. Appointment was cancelled.

## 2017-11-05 DIAGNOSIS — D509 Iron deficiency anemia, unspecified: Secondary | ICD-10-CM | POA: Diagnosis not present

## 2017-11-05 DIAGNOSIS — D631 Anemia in chronic kidney disease: Secondary | ICD-10-CM | POA: Diagnosis not present

## 2017-11-05 DIAGNOSIS — N186 End stage renal disease: Secondary | ICD-10-CM | POA: Diagnosis not present

## 2017-11-05 DIAGNOSIS — N2581 Secondary hyperparathyroidism of renal origin: Secondary | ICD-10-CM | POA: Diagnosis not present

## 2017-11-07 DIAGNOSIS — N186 End stage renal disease: Secondary | ICD-10-CM | POA: Diagnosis not present

## 2017-11-07 DIAGNOSIS — D509 Iron deficiency anemia, unspecified: Secondary | ICD-10-CM | POA: Diagnosis not present

## 2017-11-07 DIAGNOSIS — D631 Anemia in chronic kidney disease: Secondary | ICD-10-CM | POA: Diagnosis not present

## 2017-11-07 DIAGNOSIS — N2581 Secondary hyperparathyroidism of renal origin: Secondary | ICD-10-CM | POA: Diagnosis not present

## 2017-11-10 ENCOUNTER — Ambulatory Visit (INDEPENDENT_AMBULATORY_CARE_PROVIDER_SITE_OTHER): Payer: Medicare Other | Admitting: Nurse Practitioner

## 2017-11-10 ENCOUNTER — Encounter (INDEPENDENT_AMBULATORY_CARE_PROVIDER_SITE_OTHER): Payer: Self-pay | Admitting: Nurse Practitioner

## 2017-11-10 ENCOUNTER — Other Ambulatory Visit: Payer: Self-pay

## 2017-11-10 VITALS — BP 120/76 | HR 96 | Temp 97.9°F | Ht 69.0 in | Wt 142.0 lb

## 2017-11-10 DIAGNOSIS — N186 End stage renal disease: Secondary | ICD-10-CM | POA: Diagnosis not present

## 2017-11-10 DIAGNOSIS — Z992 Dependence on renal dialysis: Secondary | ICD-10-CM | POA: Diagnosis not present

## 2017-11-10 DIAGNOSIS — B360 Pityriasis versicolor: Secondary | ICD-10-CM | POA: Diagnosis not present

## 2017-11-10 DIAGNOSIS — D631 Anemia in chronic kidney disease: Secondary | ICD-10-CM | POA: Diagnosis not present

## 2017-11-10 DIAGNOSIS — I10 Essential (primary) hypertension: Secondary | ICD-10-CM | POA: Diagnosis not present

## 2017-11-10 DIAGNOSIS — N2581 Secondary hyperparathyroidism of renal origin: Secondary | ICD-10-CM | POA: Diagnosis not present

## 2017-11-10 DIAGNOSIS — D509 Iron deficiency anemia, unspecified: Secondary | ICD-10-CM | POA: Diagnosis not present

## 2017-11-10 MED ORDER — CICLOPIROX OLAMINE 0.77 % EX CREA: TOPICAL_CREAM | Freq: Two times a day (BID) | CUTANEOUS | 0 refills | Status: DC

## 2017-11-10 NOTE — Patient Instructions (Addendum)
Tinea Versicolor Tinea versicolor is a skin infection that is caused by a type of yeast. It causes a rash that shows up as light or dark patches on the skin. It often occurs on the chest, back, neck, or upper arms. The condition usually does not cause other problems. In most cases, it goes away in a few weeks with treatment. The infection cannot be spread by person to another person. Follow these instructions at home:  Take medicines only as told by your doctor.  Scrub your skin every day with a dandruff shampoo as told by your doctor.  Do not scratch your skin in the rash area.  Avoid places that are hot and humid.  Do not use tanning booths.  Try to avoid sweating a lot. Contact a doctor if:  Your symptoms get worse.  You have a fever.  You have redness, swelling, or pain in the area of your rash.  You have fluid, blood, or pus coming from your rash.  Your rash comes back after treatment. This information is not intended to replace advice given to you by your health care provider. Make sure you discuss any questions you have with your health care provider. Document Released: 05/15/2008 Document Revised: 02/03/2016 Document Reviewed: 03/14/2014 Elsevier Interactive Patient Education  2018 [employer].  Atopic Dermatitis Atopic dermatitis is a skin disorder that causes inflammation of the skin. This is the most common type of eczema. Eczema is a group of skin conditions that cause the skin to be itchy, red, and swollen. This condition is generally worse during the cooler winter months and often improves during the warm summer months. Symptoms can vary from person to person. Atopic dermatitis usually starts showing signs in infancy and can last through adulthood. This condition cannot be passed from one person to another (non-contagious), but it is more common in families. Atopic dermatitis may not always be present. When it is present, it is called a flare-up. What are the  causes? The exact cause of this condition is not known. Flare-ups of the condition may be triggered by:  Contact with something that you are sensitive or allergic to.  Stress.  Certain foods.  Extremely hot or cold weather.  Harsh chemicals and soaps.  Dry air.  Chlorine.  What increases the risk? This condition is more likely to develop in people who have a personal history or family history of eczema, allergies, asthma, or hay fever. What are the signs or symptoms? Symptoms of this condition include:  Dry, scaly skin.  Red, itchy rash.  Itchiness, which can be severe. This may occur before the skin rash. This can make sleeping difficult.  Skin thickening and cracking that can occur over time.  How is this diagnosed? This condition is diagnosed based on your symptoms, a medical history, and a physical exam. How is this treated? There is no cure for this condition, but symptoms can usually be controlled. Treatment focuses on:  Controlling the itchiness and scratching. You may be given medicines, such as antihistamines or steroid creams.  Limiting exposure to things that you are sensitive or allergic to (allergens).  Recognizing situations that cause stress and developing a plan to manage stress.  If your atopic dermatitis does not get better with medicines, or if it is all over your body (widespread), a treatment using a specific type of light (phototherapy) may be used. Follow these instructions at home: Skin care  Keep your skin well-moisturized. Doing this seals in moisture and helps to  prevent dryness. ? Use unscented lotions that have petroleum in them. ? Avoid lotions that contain alcohol or water. They can dry the skin.  Keep baths or showers short (less than 5 minutes) in warm water. Do not use hot water. ? Use mild, unscented cleansers for bathing. Avoid soap and bubble bath. ? Apply a moisturizer to your skin right after a bath or shower.  Do not apply  anything to your skin without checking with your health care provider. General instructions  Dress in clothes made of cotton or cotton blends. Dress lightly because heat increases itchiness.  When washing your clothes, rinse your clothes twice so all of the soap is removed.  Avoid any triggers that can cause a flare-up.  Try to manage your stress.  Keep your fingernails cut short.  Avoid scratching. Scratching makes the rash and itchiness worse. It may also result in a skin infection (impetigo) due to a break in the skin caused by scratching.  Take or apply over-the-counter and prescription medicines only as told by your health care provider.  Keep all follow-up visits as told by your health care provider. This is important.  Do not be around people who have cold sores or fever blisters. If you get the infection, it may cause your atopic dermatitis to worsen. Contact a health care provider if:  Your itchiness interferes with sleep.  Your rash gets worse or it is not better within one week of starting treatment.  You have a fever.  You have a rash flare-up after having contact with someone who has cold sores or fever blisters. Get help right away if:  You develop pus or soft yellow scabs in the rash area. Summary  This condition causes a red rash and itchy, dry, scaly skin.  Treatment focuses on controlling the itchiness and scratching, limiting exposure to things that you are sensitive or allergic to (allergens), recognizing situations that cause stress, and developing a plan to manage stress.  Keep your skin well-moisturized.  Keep baths or showers shorter than 5 minutes and use warm water. Do not use hot water. This information is not intended to replace advice given to you by your health care provider. Make sure you discuss any questions you have with your health care provider. Document Released: 05/30/2000 Document Revised: 07/04/2016 Document Reviewed:  07/04/2016 Elsevier Interactive Patient Education  [employer].

## 2017-11-10 NOTE — Progress Notes (Signed)
Assessment & Plan:  [name] was seen today for new patient (initial visit).  Diagnoses and all orders for this visit:  Essential (primary) hypertension Continue all antihypertensives as prescribed.  Remember to bring in your blood pressure log with you for your follow up appointment.  DASH/Mediterranean Diets are healthier choices for HTN.    ESRD on dialysis [facility]) Continue follow-up with nephrology at [facility] as well as the transplant team with [facility].  Tinea versicolor -     ciclopirox (LOPROX) 0.77 % cream; Apply topically 2 (two) times daily.  Patient has been counseled on age-appropriate routine health concerns for screening and prevention. These are reviewed and up-to-date. Referrals have been placed accordingly. Immunizations are up-to-date or declined.    Subjective:   Chief Complaint  Patient presents with  . New Patient (Initial Visit)   HPI [name] 32 y.o. male presents to office today to establish care. He has a history of ESRD 2/2 Alport's syndrome.  Started dialysis at the age of 84. S/P [place] transplant in 2009 with graft failure and eventual transplant nephrectomy in 2015. He has been back on HD since 2015. Anuric.  On the wait list for a transplant. Currently seeing Dr. Jimmy Footman with [facility]. He is alsofollowed by the transplant team at [facility].  Last eye exam was almost one year ago. He is planning on scheduling himself for his eye exam.   Essential Hypertension Well controlled today however he has just finished his dialysis treatment today as well.  He is not currently taking lisinopril $RemoveBeforeDEI'10mg'XPdnOPpxQuVpEuRj$ . Denies chest pain, shortness of breath, palpitations, lightheadedness, dizziness, headaches or BLE edema. Will not refill ACE at this time. Will continue to monitor.  BP Readings from Last 3 Encounters:  11/10/17 120/76  11/30/16 (!) 171/109  08/16/16 119/78    ESRD He has a dialysis on T-TH-Saturday. He sees nephrology once a  week. He has blood work drawn at least every 2 months.    Tinea Versicolor Patient complains of flat, abnormally colored skin lesions on the face and neck. Symptoms have been ongoing for about 5 years. Patient has not been treated in the past.   Review of Systems  Constitutional: Negative for fever, malaise/fatigue and weight loss.  HENT: Positive for hearing loss. Negative for nosebleeds and sore throat.   Eyes: Positive for blurred vision (Wears glasses). Negative for double vision and photophobia.  Respiratory: Negative.  Negative for cough and shortness of breath.   Cardiovascular: Negative.  Negative for chest pain, palpitations and leg swelling.  Gastrointestinal: Negative.  Negative for heartburn, nausea and vomiting.  Musculoskeletal: Negative.  Negative for myalgias.  Skin: Positive for rash.  Neurological: Negative.  Negative for dizziness, focal weakness, seizures and headaches.  Psychiatric/Behavioral: Negative.  Negative for suicidal ideas.    Past Medical History:  Diagnosis Date  . Alport syndrome   . Alport's syndrome    EFFECT KIDNEYS+ HEARING  . Anemia   . Blood dyscrasia   . GERD (gastroesophageal reflux disease)   . GERD (gastroesophageal reflux disease)   . Headache   . Heart murmur    ASYMPTOMATIC  . Kidney failure as a baby   went on dialysis age 59      T79  . Sickle cell trait [facility])     Past Surgical History:  Procedure Laterality Date  . AV FISTULA PLACEMENT     LEFT ARM  . KIDNEY TRANSPLANT    . KIDNEY TRANSPLANT    .  NEPHRECTOMY TRANSPLANTED ORGAN    . PARATHYROIDECTOMY N/A 12/03/2015   Procedure: TOTAL PARATHYROIDECTOMY WITH  AUTOTRANSPLANT TO RIGHT FOREARM ;  Surgeon: Armandina Gemma, MD;  Location: [place];  Service: General;  Laterality: N/A;  . transplant kidney removed Right 03/20/2014    Family History  Problem Relation Age of Onset  . Cancer Mother     Social History Reviewed with no changes to be made today.   Outpatient  Medications Prior to Visit  Medication Sig Dispense Refill  . acetaminophen (TYLENOL) 325 MG tablet Take 325 mg by mouth daily as needed.    . calcitRIOL (ROCALTROL) 0.25 MCG capsule Take 0.5 mcg by mouth daily.     [place] Kitchen epoetin alfa (EPOGEN,PROCRIT) 2000 UNIT/ML injection Inject 4,000 Units into the skin 3 (three) times a week.    . multivitamin (RENA-VIT) TABS tablet Take 1 tablet by mouth at bedtime. 90 tablet 1  . sevelamer carbonate (RENVELA) 800 MG tablet Take 3,200 mg by mouth 3 (three) times daily with meals.    . calcium acetate (PHOSLO) 667 MG capsule Take 667 mg by mouth daily.  12  . diphenhydrAMINE (SOMINEX) 25 MG tablet Take 25 mg by mouth at bedtime as needed for itching or sleep.    [place] Kitchen lisinopril (PRINIVIL,ZESTRIL) 10 MG tablet Take 20 mg by mouth at bedtime.   0   No facility-administered medications prior to visit.     No Known Allergies     Objective:    BP 120/76 (BP Location: Right Arm, Patient Position: Sitting, Cuff Size: Normal)   Pulse 96   Temp 97.9 F (36.6 C) (Oral)   Ht $R'5\' 9"'Rt$  (1.753 m)   Wt 142 lb (64.4 kg)   SpO2 97%   BMI 20.97 kg/m  Wt Readings from Last 3 Encounters:  11/10/17 142 lb (64.4 kg)  11/30/16 140 lb (63.5 kg)  12/04/15 137 lb 2 oz (62.2 kg)    Physical Exam  Constitutional: He is oriented to person, place, and time. He appears well-developed and well-nourished. He is cooperative.  HENT:  Head: Normocephalic and atraumatic.  Right Ear: Decreased hearing is noted.  Left Ear: Decreased hearing is noted.  Eyes: EOM are normal.  Neck: Normal range of motion.  Cardiovascular: Normal rate, regular rhythm and normal heart sounds. Exam reveals no gallop and no friction rub.  No murmur heard. Pulmonary/Chest: Effort normal and breath sounds normal. No tachypnea. No respiratory distress. He has no decreased breath sounds. He has no wheezes. He has no rhonchi. He has no rales. He exhibits no tenderness.  Abdominal: Soft. Bowel sounds are  normal.  Musculoskeletal: Normal range of motion. He exhibits no edema.       Arms: Neurological: He is alert and oriented to person, place, and time. Coordination normal.  Skin: Skin is warm and dry. Rash (Hypopigmentation of bilateral mandibular areas as well as anterior neck.) noted. Rash is macular.  Psychiatric: He has a normal mood and affect. His behavior is normal. Judgment and thought content normal.  Nursing note and vitals reviewed.      Patient has been counseled extensively about nutrition and exercise as well as the importance of adherence with medications and regular follow-up. The patient was given clear instructions to go to ER or return to medical center if symptoms don't improve, worsen or new problems develop. The patient verbalized understanding.   Follow-up: Return in about 1 month (around 12/08/2017) for Tinea Versicolor.   Gildardo Pounds, FNP-BC [facility]  Health and [place], [place]   11/12/2017, 7:46 PM

## 2017-11-12 ENCOUNTER — Encounter (INDEPENDENT_AMBULATORY_CARE_PROVIDER_SITE_OTHER): Payer: Self-pay | Admitting: Nurse Practitioner

## 2017-11-12 DIAGNOSIS — D631 Anemia in chronic kidney disease: Secondary | ICD-10-CM | POA: Diagnosis not present

## 2017-11-12 DIAGNOSIS — D509 Iron deficiency anemia, unspecified: Secondary | ICD-10-CM | POA: Diagnosis not present

## 2017-11-12 DIAGNOSIS — N186 End stage renal disease: Secondary | ICD-10-CM | POA: Diagnosis not present

## 2017-11-12 DIAGNOSIS — N2581 Secondary hyperparathyroidism of renal origin: Secondary | ICD-10-CM | POA: Diagnosis not present

## 2017-11-14 DIAGNOSIS — N186 End stage renal disease: Secondary | ICD-10-CM | POA: Diagnosis not present

## 2017-11-14 DIAGNOSIS — Z992 Dependence on renal dialysis: Secondary | ICD-10-CM | POA: Diagnosis not present

## 2017-11-14 DIAGNOSIS — D509 Iron deficiency anemia, unspecified: Secondary | ICD-10-CM | POA: Diagnosis not present

## 2017-11-14 DIAGNOSIS — D631 Anemia in chronic kidney disease: Secondary | ICD-10-CM | POA: Diagnosis not present

## 2017-11-14 DIAGNOSIS — T8612 Kidney transplant failure: Secondary | ICD-10-CM | POA: Diagnosis not present

## 2017-11-14 DIAGNOSIS — N2581 Secondary hyperparathyroidism of renal origin: Secondary | ICD-10-CM | POA: Diagnosis not present

## 2017-11-17 DIAGNOSIS — N2581 Secondary hyperparathyroidism of renal origin: Secondary | ICD-10-CM | POA: Diagnosis not present

## 2017-11-17 DIAGNOSIS — D631 Anemia in chronic kidney disease: Secondary | ICD-10-CM | POA: Diagnosis not present

## 2017-11-17 DIAGNOSIS — N186 End stage renal disease: Secondary | ICD-10-CM | POA: Diagnosis not present

## 2017-11-17 DIAGNOSIS — D509 Iron deficiency anemia, unspecified: Secondary | ICD-10-CM | POA: Diagnosis not present

## 2017-11-19 DIAGNOSIS — D509 Iron deficiency anemia, unspecified: Secondary | ICD-10-CM | POA: Diagnosis not present

## 2017-11-19 DIAGNOSIS — N2581 Secondary hyperparathyroidism of renal origin: Secondary | ICD-10-CM | POA: Diagnosis not present

## 2017-11-19 DIAGNOSIS — N186 End stage renal disease: Secondary | ICD-10-CM | POA: Diagnosis not present

## 2017-11-19 DIAGNOSIS — D631 Anemia in chronic kidney disease: Secondary | ICD-10-CM | POA: Diagnosis not present

## 2017-11-21 DIAGNOSIS — D631 Anemia in chronic kidney disease: Secondary | ICD-10-CM | POA: Diagnosis not present

## 2017-11-21 DIAGNOSIS — N2581 Secondary hyperparathyroidism of renal origin: Secondary | ICD-10-CM | POA: Diagnosis not present

## 2017-11-21 DIAGNOSIS — N186 End stage renal disease: Secondary | ICD-10-CM | POA: Diagnosis not present

## 2017-11-21 DIAGNOSIS — D509 Iron deficiency anemia, unspecified: Secondary | ICD-10-CM | POA: Diagnosis not present

## 2017-11-24 DIAGNOSIS — D509 Iron deficiency anemia, unspecified: Secondary | ICD-10-CM | POA: Diagnosis not present

## 2017-11-24 DIAGNOSIS — N186 End stage renal disease: Secondary | ICD-10-CM | POA: Diagnosis not present

## 2017-11-24 DIAGNOSIS — N2581 Secondary hyperparathyroidism of renal origin: Secondary | ICD-10-CM | POA: Diagnosis not present

## 2017-11-24 DIAGNOSIS — D631 Anemia in chronic kidney disease: Secondary | ICD-10-CM | POA: Diagnosis not present

## 2017-11-26 DIAGNOSIS — N2581 Secondary hyperparathyroidism of renal origin: Secondary | ICD-10-CM | POA: Diagnosis not present

## 2017-11-26 DIAGNOSIS — D631 Anemia in chronic kidney disease: Secondary | ICD-10-CM | POA: Diagnosis not present

## 2017-11-26 DIAGNOSIS — N186 End stage renal disease: Secondary | ICD-10-CM | POA: Diagnosis not present

## 2017-11-26 DIAGNOSIS — D509 Iron deficiency anemia, unspecified: Secondary | ICD-10-CM | POA: Diagnosis not present

## 2017-11-28 DIAGNOSIS — D509 Iron deficiency anemia, unspecified: Secondary | ICD-10-CM | POA: Diagnosis not present

## 2017-11-28 DIAGNOSIS — N186 End stage renal disease: Secondary | ICD-10-CM | POA: Diagnosis not present

## 2017-11-28 DIAGNOSIS — D631 Anemia in chronic kidney disease: Secondary | ICD-10-CM | POA: Diagnosis not present

## 2017-11-28 DIAGNOSIS — N2581 Secondary hyperparathyroidism of renal origin: Secondary | ICD-10-CM | POA: Diagnosis not present

## 2017-12-01 ENCOUNTER — Encounter: Admit: 2017-12-01 | Discharge: 2017-12-01 | Payer: MEDICARE | Attending: Nephrology | Primary: Nephrology

## 2017-12-01 DIAGNOSIS — N2581 Secondary hyperparathyroidism of renal origin: Secondary | ICD-10-CM | POA: Diagnosis not present

## 2017-12-01 DIAGNOSIS — D631 Anemia in chronic kidney disease: Secondary | ICD-10-CM | POA: Diagnosis not present

## 2017-12-01 DIAGNOSIS — D509 Iron deficiency anemia, unspecified: Secondary | ICD-10-CM | POA: Diagnosis not present

## 2017-12-01 DIAGNOSIS — N186 End stage renal disease: Secondary | ICD-10-CM | POA: Diagnosis not present

## 2017-12-03 DIAGNOSIS — D631 Anemia in chronic kidney disease: Secondary | ICD-10-CM | POA: Diagnosis not present

## 2017-12-03 DIAGNOSIS — N186 End stage renal disease: Secondary | ICD-10-CM | POA: Diagnosis not present

## 2017-12-03 DIAGNOSIS — N2581 Secondary hyperparathyroidism of renal origin: Secondary | ICD-10-CM | POA: Diagnosis not present

## 2017-12-03 DIAGNOSIS — D509 Iron deficiency anemia, unspecified: Secondary | ICD-10-CM | POA: Diagnosis not present

## 2017-12-05 DIAGNOSIS — N2581 Secondary hyperparathyroidism of renal origin: Secondary | ICD-10-CM | POA: Diagnosis not present

## 2017-12-05 DIAGNOSIS — D631 Anemia in chronic kidney disease: Secondary | ICD-10-CM | POA: Diagnosis not present

## 2017-12-05 DIAGNOSIS — D509 Iron deficiency anemia, unspecified: Secondary | ICD-10-CM | POA: Diagnosis not present

## 2017-12-05 DIAGNOSIS — N186 End stage renal disease: Secondary | ICD-10-CM | POA: Diagnosis not present

## 2017-12-08 ENCOUNTER — Ambulatory Visit (INDEPENDENT_AMBULATORY_CARE_PROVIDER_SITE_OTHER): Payer: Self-pay | Admitting: Physician Assistant

## 2017-12-08 DIAGNOSIS — N186 End stage renal disease: Secondary | ICD-10-CM

## 2017-12-08 DIAGNOSIS — Z01818 Encounter for other preprocedural examination: Secondary | ICD-10-CM

## 2017-12-08 DIAGNOSIS — Z7682 Awaiting organ transplant status: Principal | ICD-10-CM

## 2017-12-08 DIAGNOSIS — N2581 Secondary hyperparathyroidism of renal origin: Secondary | ICD-10-CM | POA: Diagnosis not present

## 2017-12-08 DIAGNOSIS — D631 Anemia in chronic kidney disease: Secondary | ICD-10-CM | POA: Diagnosis not present

## 2017-12-08 DIAGNOSIS — D509 Iron deficiency anemia, unspecified: Secondary | ICD-10-CM | POA: Diagnosis not present

## 2017-12-10 DIAGNOSIS — N2581 Secondary hyperparathyroidism of renal origin: Secondary | ICD-10-CM | POA: Diagnosis not present

## 2017-12-10 DIAGNOSIS — N186 End stage renal disease: Secondary | ICD-10-CM | POA: Diagnosis not present

## 2017-12-10 DIAGNOSIS — D631 Anemia in chronic kidney disease: Secondary | ICD-10-CM | POA: Diagnosis not present

## 2017-12-10 DIAGNOSIS — D509 Iron deficiency anemia, unspecified: Secondary | ICD-10-CM | POA: Diagnosis not present

## 2017-12-12 DIAGNOSIS — N186 End stage renal disease: Secondary | ICD-10-CM | POA: Diagnosis not present

## 2017-12-12 DIAGNOSIS — D509 Iron deficiency anemia, unspecified: Secondary | ICD-10-CM | POA: Diagnosis not present

## 2017-12-12 DIAGNOSIS — N2581 Secondary hyperparathyroidism of renal origin: Secondary | ICD-10-CM | POA: Diagnosis not present

## 2017-12-12 DIAGNOSIS — D631 Anemia in chronic kidney disease: Secondary | ICD-10-CM | POA: Diagnosis not present

## 2017-12-14 DIAGNOSIS — Z992 Dependence on renal dialysis: Secondary | ICD-10-CM | POA: Diagnosis not present

## 2017-12-14 DIAGNOSIS — N186 End stage renal disease: Secondary | ICD-10-CM | POA: Diagnosis not present

## 2017-12-14 DIAGNOSIS — T8612 Kidney transplant failure: Secondary | ICD-10-CM | POA: Diagnosis not present

## 2017-12-15 DIAGNOSIS — D631 Anemia in chronic kidney disease: Secondary | ICD-10-CM | POA: Diagnosis not present

## 2017-12-15 DIAGNOSIS — N2581 Secondary hyperparathyroidism of renal origin: Secondary | ICD-10-CM | POA: Diagnosis not present

## 2017-12-15 DIAGNOSIS — N186 End stage renal disease: Secondary | ICD-10-CM | POA: Diagnosis not present

## 2017-12-15 DIAGNOSIS — D509 Iron deficiency anemia, unspecified: Secondary | ICD-10-CM | POA: Diagnosis not present

## 2017-12-17 DIAGNOSIS — D509 Iron deficiency anemia, unspecified: Secondary | ICD-10-CM | POA: Diagnosis not present

## 2017-12-17 DIAGNOSIS — N186 End stage renal disease: Secondary | ICD-10-CM | POA: Diagnosis not present

## 2017-12-17 DIAGNOSIS — N2581 Secondary hyperparathyroidism of renal origin: Secondary | ICD-10-CM | POA: Diagnosis not present

## 2017-12-17 DIAGNOSIS — D631 Anemia in chronic kidney disease: Secondary | ICD-10-CM | POA: Diagnosis not present

## 2017-12-19 DIAGNOSIS — N186 End stage renal disease: Secondary | ICD-10-CM | POA: Diagnosis not present

## 2017-12-19 DIAGNOSIS — N2581 Secondary hyperparathyroidism of renal origin: Secondary | ICD-10-CM | POA: Diagnosis not present

## 2017-12-19 DIAGNOSIS — D509 Iron deficiency anemia, unspecified: Secondary | ICD-10-CM | POA: Diagnosis not present

## 2017-12-19 DIAGNOSIS — D631 Anemia in chronic kidney disease: Secondary | ICD-10-CM | POA: Diagnosis not present

## 2017-12-22 DIAGNOSIS — N2581 Secondary hyperparathyroidism of renal origin: Secondary | ICD-10-CM | POA: Diagnosis not present

## 2017-12-22 DIAGNOSIS — N186 End stage renal disease: Secondary | ICD-10-CM | POA: Diagnosis not present

## 2017-12-22 DIAGNOSIS — D631 Anemia in chronic kidney disease: Secondary | ICD-10-CM | POA: Diagnosis not present

## 2017-12-22 DIAGNOSIS — D509 Iron deficiency anemia, unspecified: Secondary | ICD-10-CM | POA: Diagnosis not present

## 2017-12-24 DIAGNOSIS — D509 Iron deficiency anemia, unspecified: Secondary | ICD-10-CM | POA: Diagnosis not present

## 2017-12-24 DIAGNOSIS — N186 End stage renal disease: Secondary | ICD-10-CM | POA: Diagnosis not present

## 2017-12-24 DIAGNOSIS — D631 Anemia in chronic kidney disease: Secondary | ICD-10-CM | POA: Diagnosis not present

## 2017-12-24 DIAGNOSIS — N2581 Secondary hyperparathyroidism of renal origin: Secondary | ICD-10-CM | POA: Diagnosis not present

## 2017-12-26 DIAGNOSIS — N2581 Secondary hyperparathyroidism of renal origin: Secondary | ICD-10-CM | POA: Diagnosis not present

## 2017-12-26 DIAGNOSIS — N186 End stage renal disease: Secondary | ICD-10-CM | POA: Diagnosis not present

## 2017-12-26 DIAGNOSIS — D631 Anemia in chronic kidney disease: Secondary | ICD-10-CM | POA: Diagnosis not present

## 2017-12-26 DIAGNOSIS — D509 Iron deficiency anemia, unspecified: Secondary | ICD-10-CM | POA: Diagnosis not present

## 2017-12-29 ENCOUNTER — Encounter: Admit: 2017-12-29 | Discharge: 2017-12-29 | Payer: MEDICARE | Attending: Nephrology | Primary: Nephrology

## 2017-12-29 DIAGNOSIS — D509 Iron deficiency anemia, unspecified: Secondary | ICD-10-CM | POA: Diagnosis not present

## 2017-12-29 DIAGNOSIS — N186 End stage renal disease: Secondary | ICD-10-CM | POA: Diagnosis not present

## 2017-12-29 DIAGNOSIS — D631 Anemia in chronic kidney disease: Secondary | ICD-10-CM | POA: Diagnosis not present

## 2017-12-29 DIAGNOSIS — N2581 Secondary hyperparathyroidism of renal origin: Secondary | ICD-10-CM | POA: Diagnosis not present

## 2017-12-29 IMAGING — DX DG CHEST 2V
2 series · 2 of 2 positions shown · non-contrast
Comparison: Chest radiograph performed 04/26/2014

CLINICAL DATA: Acute onset of generalized chest pain, status post
recent total parathyroidectomy with autotransplant to right forearm.
Initial encounter.

EXAM:
CHEST  2 VIEW

[chest lat]
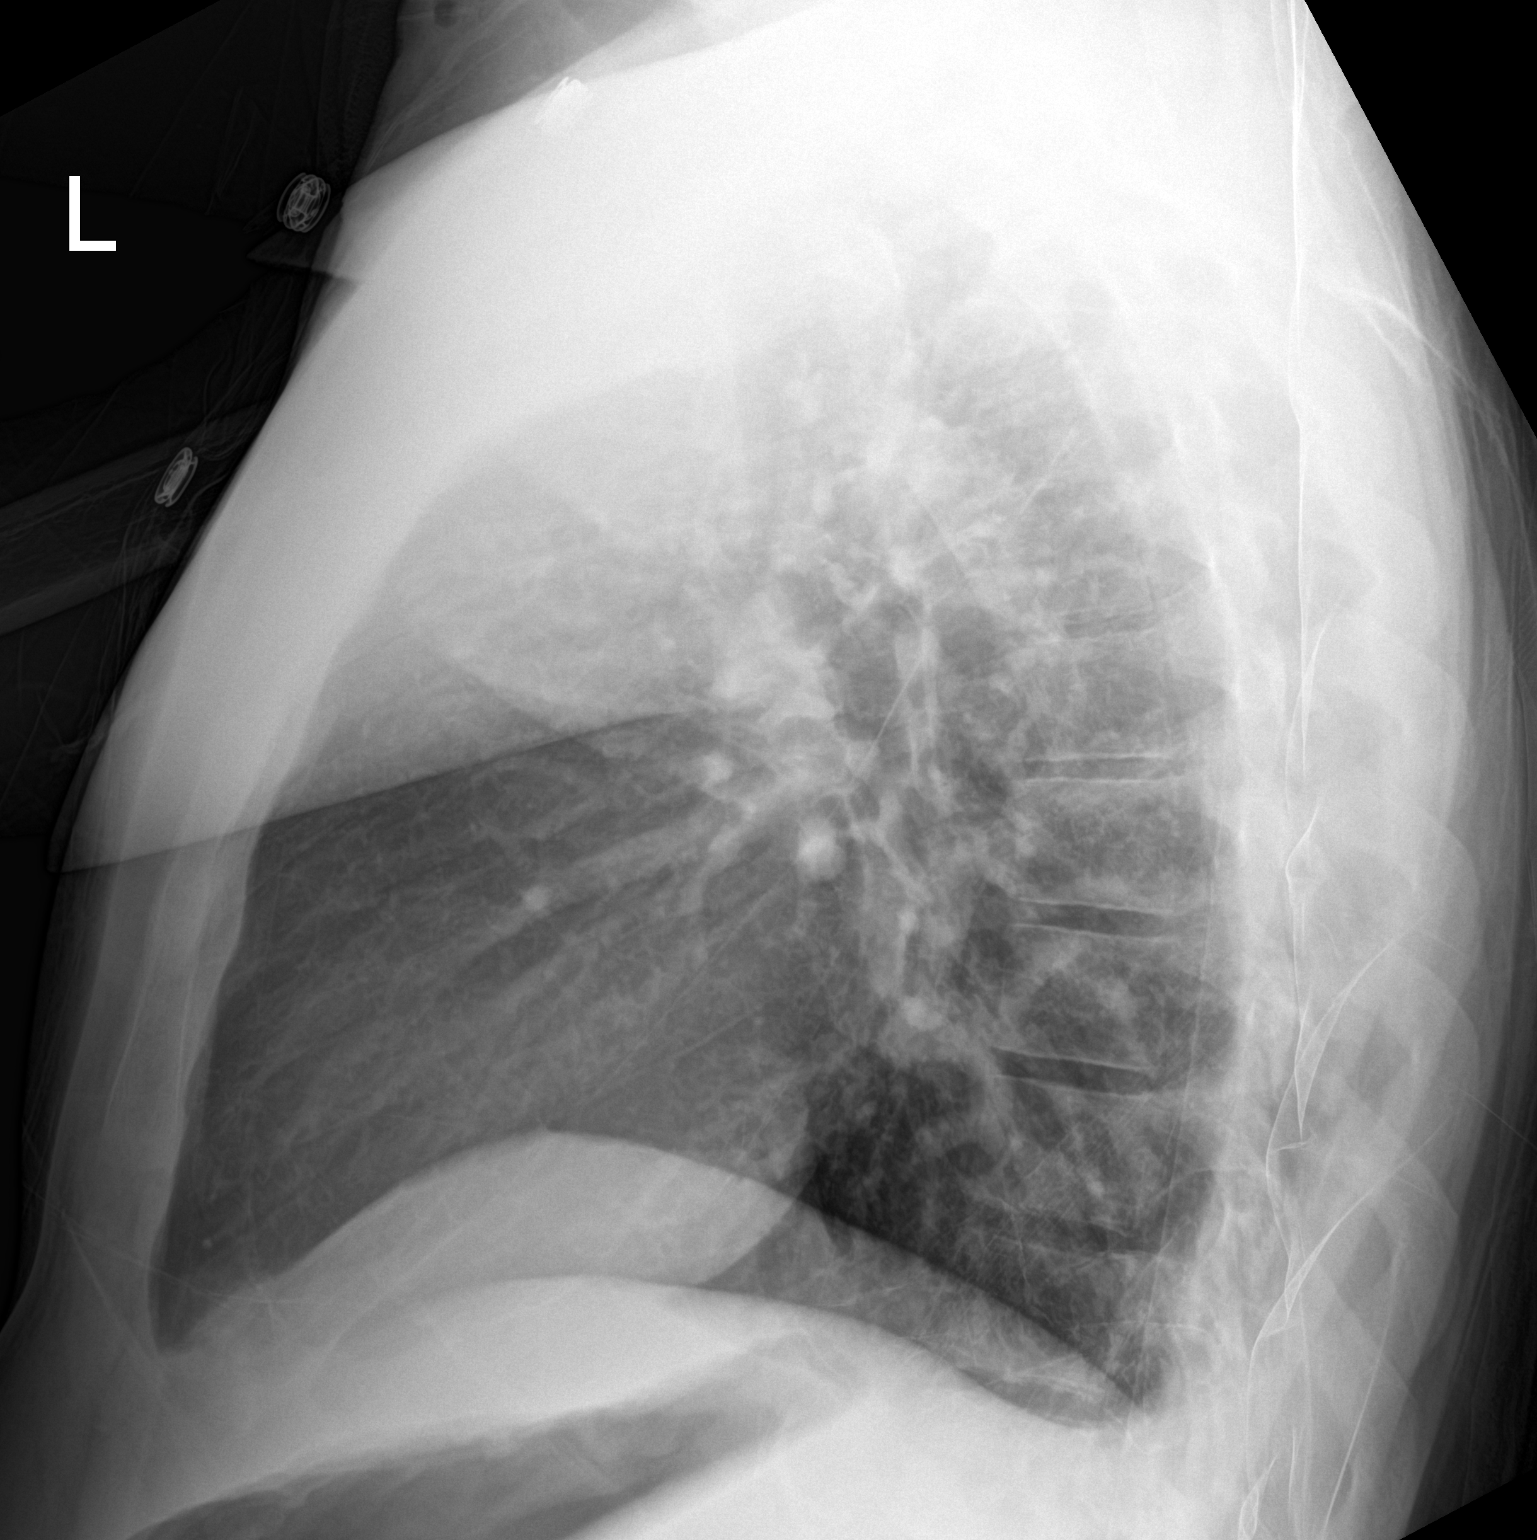

[chest ap]
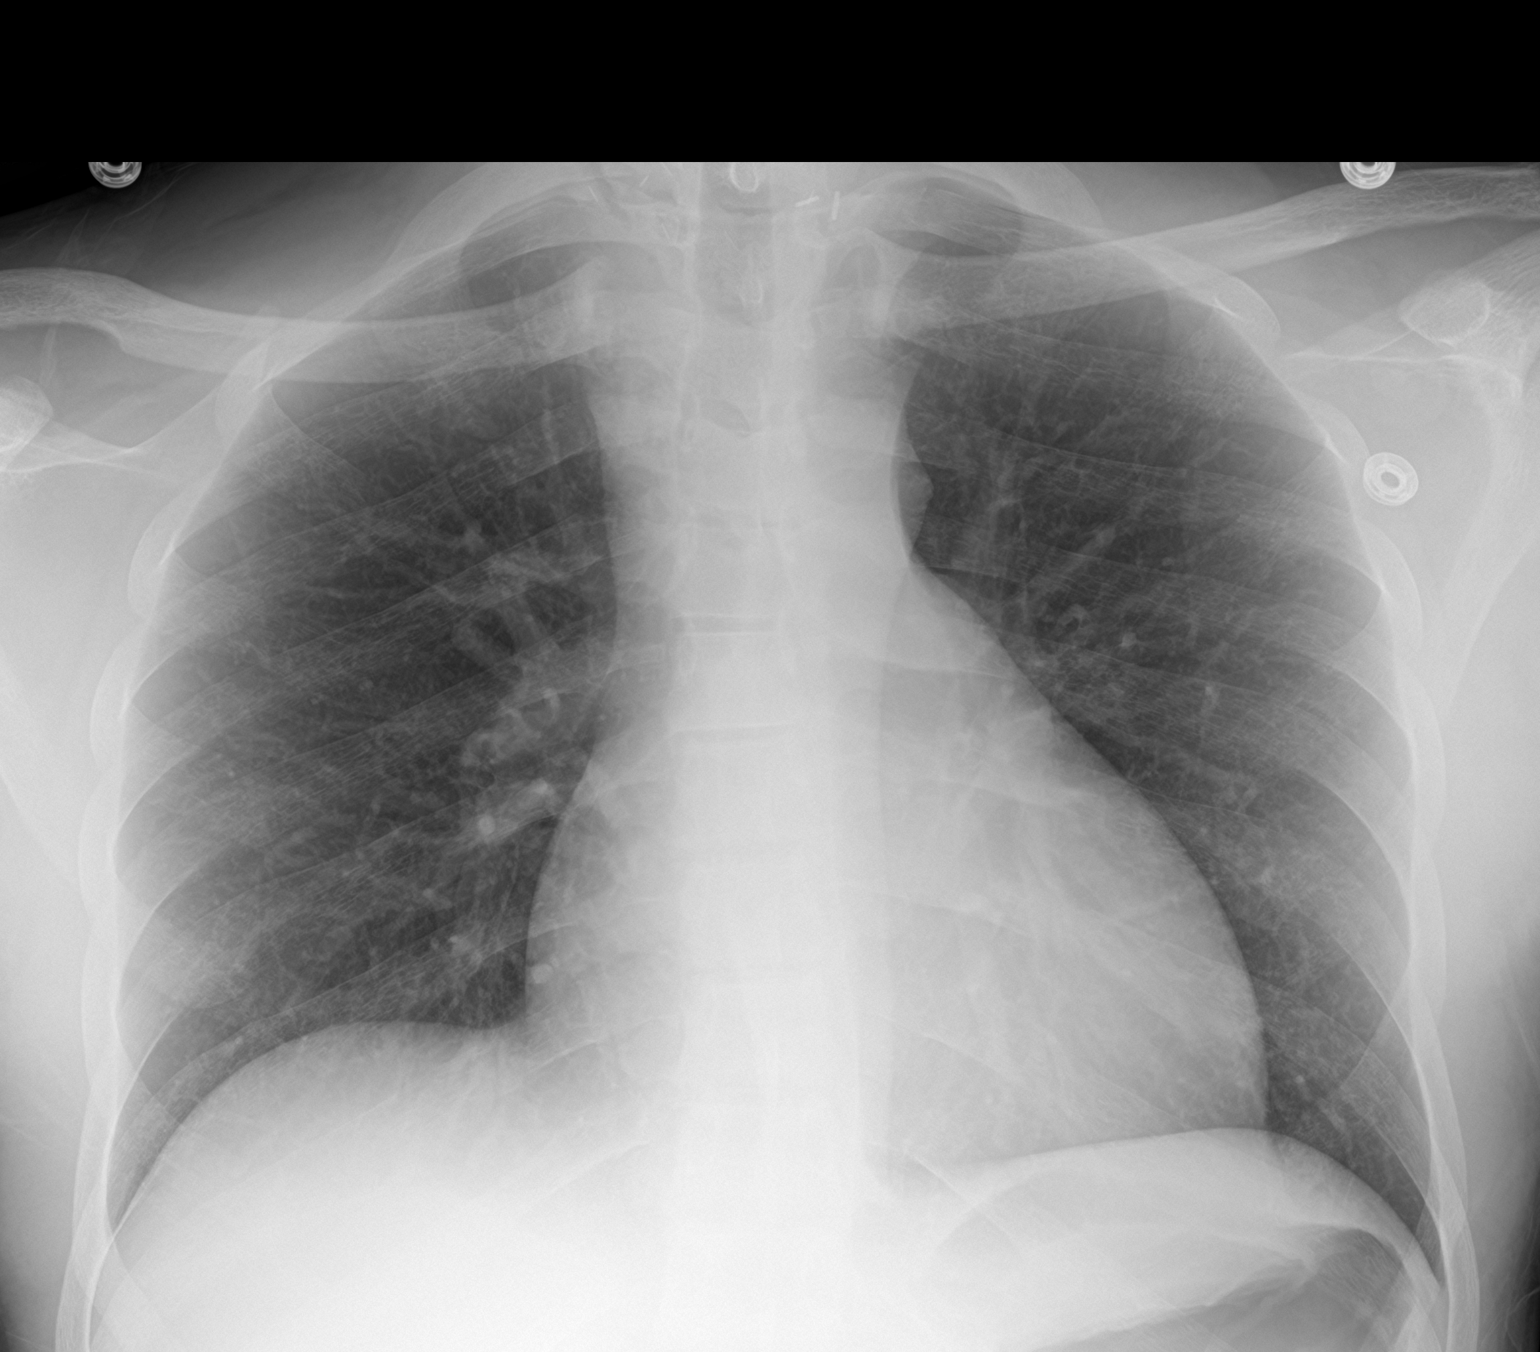

[2 of 2 positions shown; findings below may reference images not displayed]

FINDINGS: The lungs are well-aerated and clear. There is no evidence of focal
opacification, pleural effusion or pneumothorax.

The heart is borderline enlarged. No acute osseous abnormalities are
seen.
IMPRESSION: Borderline cardiomegaly.  Lungs remain grossly clear.

## 2017-12-31 DIAGNOSIS — N2581 Secondary hyperparathyroidism of renal origin: Secondary | ICD-10-CM | POA: Diagnosis not present

## 2017-12-31 DIAGNOSIS — N186 End stage renal disease: Secondary | ICD-10-CM | POA: Diagnosis not present

## 2017-12-31 DIAGNOSIS — D631 Anemia in chronic kidney disease: Secondary | ICD-10-CM | POA: Diagnosis not present

## 2017-12-31 DIAGNOSIS — D509 Iron deficiency anemia, unspecified: Secondary | ICD-10-CM | POA: Diagnosis not present

## 2018-01-02 DIAGNOSIS — D509 Iron deficiency anemia, unspecified: Secondary | ICD-10-CM | POA: Diagnosis not present

## 2018-01-02 DIAGNOSIS — N186 End stage renal disease: Secondary | ICD-10-CM | POA: Diagnosis not present

## 2018-01-02 DIAGNOSIS — N2581 Secondary hyperparathyroidism of renal origin: Secondary | ICD-10-CM | POA: Diagnosis not present

## 2018-01-02 DIAGNOSIS — D631 Anemia in chronic kidney disease: Secondary | ICD-10-CM | POA: Diagnosis not present

## 2018-01-05 DIAGNOSIS — N186 End stage renal disease: Secondary | ICD-10-CM

## 2018-01-05 DIAGNOSIS — Z01818 Encounter for other preprocedural examination: Secondary | ICD-10-CM

## 2018-01-05 DIAGNOSIS — Z7682 Awaiting organ transplant status: Principal | ICD-10-CM

## 2018-01-05 DIAGNOSIS — N2581 Secondary hyperparathyroidism of renal origin: Secondary | ICD-10-CM | POA: Diagnosis not present

## 2018-01-05 DIAGNOSIS — D631 Anemia in chronic kidney disease: Secondary | ICD-10-CM | POA: Diagnosis not present

## 2018-01-05 DIAGNOSIS — D509 Iron deficiency anemia, unspecified: Secondary | ICD-10-CM | POA: Diagnosis not present

## 2018-01-07 DIAGNOSIS — N2581 Secondary hyperparathyroidism of renal origin: Secondary | ICD-10-CM | POA: Diagnosis not present

## 2018-01-07 DIAGNOSIS — N186 End stage renal disease: Secondary | ICD-10-CM | POA: Diagnosis not present

## 2018-01-07 DIAGNOSIS — D631 Anemia in chronic kidney disease: Secondary | ICD-10-CM | POA: Diagnosis not present

## 2018-01-07 DIAGNOSIS — D509 Iron deficiency anemia, unspecified: Secondary | ICD-10-CM | POA: Diagnosis not present

## 2018-01-09 DIAGNOSIS — D509 Iron deficiency anemia, unspecified: Secondary | ICD-10-CM | POA: Diagnosis not present

## 2018-01-09 DIAGNOSIS — D631 Anemia in chronic kidney disease: Secondary | ICD-10-CM | POA: Diagnosis not present

## 2018-01-09 DIAGNOSIS — N186 End stage renal disease: Secondary | ICD-10-CM | POA: Diagnosis not present

## 2018-01-09 DIAGNOSIS — N2581 Secondary hyperparathyroidism of renal origin: Secondary | ICD-10-CM | POA: Diagnosis not present

## 2018-01-12 DIAGNOSIS — D509 Iron deficiency anemia, unspecified: Secondary | ICD-10-CM | POA: Diagnosis not present

## 2018-01-12 DIAGNOSIS — N2581 Secondary hyperparathyroidism of renal origin: Secondary | ICD-10-CM | POA: Diagnosis not present

## 2018-01-12 DIAGNOSIS — D631 Anemia in chronic kidney disease: Secondary | ICD-10-CM | POA: Diagnosis not present

## 2018-01-12 DIAGNOSIS — N186 End stage renal disease: Secondary | ICD-10-CM | POA: Diagnosis not present

## 2018-01-14 DIAGNOSIS — N2581 Secondary hyperparathyroidism of renal origin: Secondary | ICD-10-CM | POA: Diagnosis not present

## 2018-01-14 DIAGNOSIS — T8612 Kidney transplant failure: Secondary | ICD-10-CM | POA: Diagnosis not present

## 2018-01-14 DIAGNOSIS — Z992 Dependence on renal dialysis: Secondary | ICD-10-CM | POA: Diagnosis not present

## 2018-01-14 DIAGNOSIS — N186 End stage renal disease: Secondary | ICD-10-CM | POA: Diagnosis not present

## 2018-01-14 DIAGNOSIS — D509 Iron deficiency anemia, unspecified: Secondary | ICD-10-CM | POA: Diagnosis not present

## 2018-01-14 DIAGNOSIS — D631 Anemia in chronic kidney disease: Secondary | ICD-10-CM | POA: Diagnosis not present

## 2018-01-16 DIAGNOSIS — D509 Iron deficiency anemia, unspecified: Secondary | ICD-10-CM | POA: Diagnosis not present

## 2018-01-16 DIAGNOSIS — N186 End stage renal disease: Secondary | ICD-10-CM | POA: Diagnosis not present

## 2018-01-16 DIAGNOSIS — N2581 Secondary hyperparathyroidism of renal origin: Secondary | ICD-10-CM | POA: Diagnosis not present

## 2018-01-16 DIAGNOSIS — D631 Anemia in chronic kidney disease: Secondary | ICD-10-CM | POA: Diagnosis not present

## 2018-01-19 DIAGNOSIS — D631 Anemia in chronic kidney disease: Secondary | ICD-10-CM | POA: Diagnosis not present

## 2018-01-19 DIAGNOSIS — N186 End stage renal disease: Secondary | ICD-10-CM | POA: Diagnosis not present

## 2018-01-19 DIAGNOSIS — N2581 Secondary hyperparathyroidism of renal origin: Secondary | ICD-10-CM | POA: Diagnosis not present

## 2018-01-19 DIAGNOSIS — D509 Iron deficiency anemia, unspecified: Secondary | ICD-10-CM | POA: Diagnosis not present

## 2018-01-21 DIAGNOSIS — D509 Iron deficiency anemia, unspecified: Secondary | ICD-10-CM | POA: Diagnosis not present

## 2018-01-21 DIAGNOSIS — N186 End stage renal disease: Secondary | ICD-10-CM | POA: Diagnosis not present

## 2018-01-21 DIAGNOSIS — N2581 Secondary hyperparathyroidism of renal origin: Secondary | ICD-10-CM | POA: Diagnosis not present

## 2018-01-21 DIAGNOSIS — D631 Anemia in chronic kidney disease: Secondary | ICD-10-CM | POA: Diagnosis not present

## 2018-01-23 DIAGNOSIS — N2581 Secondary hyperparathyroidism of renal origin: Secondary | ICD-10-CM | POA: Diagnosis not present

## 2018-01-23 DIAGNOSIS — D631 Anemia in chronic kidney disease: Secondary | ICD-10-CM | POA: Diagnosis not present

## 2018-01-23 DIAGNOSIS — D509 Iron deficiency anemia, unspecified: Secondary | ICD-10-CM | POA: Diagnosis not present

## 2018-01-23 DIAGNOSIS — N186 End stage renal disease: Secondary | ICD-10-CM | POA: Diagnosis not present

## 2018-01-26 DIAGNOSIS — N2581 Secondary hyperparathyroidism of renal origin: Secondary | ICD-10-CM | POA: Diagnosis not present

## 2018-01-26 DIAGNOSIS — N186 End stage renal disease: Secondary | ICD-10-CM | POA: Diagnosis not present

## 2018-01-26 DIAGNOSIS — D509 Iron deficiency anemia, unspecified: Secondary | ICD-10-CM | POA: Diagnosis not present

## 2018-01-26 DIAGNOSIS — D631 Anemia in chronic kidney disease: Secondary | ICD-10-CM | POA: Diagnosis not present

## 2018-01-28 DIAGNOSIS — N2581 Secondary hyperparathyroidism of renal origin: Secondary | ICD-10-CM | POA: Diagnosis not present

## 2018-01-28 DIAGNOSIS — D631 Anemia in chronic kidney disease: Secondary | ICD-10-CM | POA: Diagnosis not present

## 2018-01-28 DIAGNOSIS — N186 End stage renal disease: Secondary | ICD-10-CM | POA: Diagnosis not present

## 2018-01-28 DIAGNOSIS — D509 Iron deficiency anemia, unspecified: Secondary | ICD-10-CM | POA: Diagnosis not present

## 2018-01-30 DIAGNOSIS — N186 End stage renal disease: Secondary | ICD-10-CM | POA: Diagnosis not present

## 2018-01-30 DIAGNOSIS — D509 Iron deficiency anemia, unspecified: Secondary | ICD-10-CM | POA: Diagnosis not present

## 2018-01-30 DIAGNOSIS — N2581 Secondary hyperparathyroidism of renal origin: Secondary | ICD-10-CM | POA: Diagnosis not present

## 2018-01-30 DIAGNOSIS — D631 Anemia in chronic kidney disease: Secondary | ICD-10-CM | POA: Diagnosis not present

## 2018-02-02 ENCOUNTER — Encounter: Admit: 2018-02-02 | Discharge: 2018-02-02 | Payer: MEDICARE | Attending: Nephrology | Primary: Nephrology

## 2018-02-02 DIAGNOSIS — N2581 Secondary hyperparathyroidism of renal origin: Secondary | ICD-10-CM | POA: Diagnosis not present

## 2018-02-02 DIAGNOSIS — N186 End stage renal disease: Secondary | ICD-10-CM | POA: Diagnosis not present

## 2018-02-02 DIAGNOSIS — D509 Iron deficiency anemia, unspecified: Secondary | ICD-10-CM | POA: Diagnosis not present

## 2018-02-02 DIAGNOSIS — D631 Anemia in chronic kidney disease: Secondary | ICD-10-CM | POA: Diagnosis not present

## 2018-02-04 DIAGNOSIS — N2581 Secondary hyperparathyroidism of renal origin: Secondary | ICD-10-CM | POA: Diagnosis not present

## 2018-02-04 DIAGNOSIS — D631 Anemia in chronic kidney disease: Secondary | ICD-10-CM | POA: Diagnosis not present

## 2018-02-04 DIAGNOSIS — N186 End stage renal disease: Secondary | ICD-10-CM | POA: Diagnosis not present

## 2018-02-04 DIAGNOSIS — D509 Iron deficiency anemia, unspecified: Secondary | ICD-10-CM | POA: Diagnosis not present

## 2018-02-06 DIAGNOSIS — D631 Anemia in chronic kidney disease: Secondary | ICD-10-CM | POA: Diagnosis not present

## 2018-02-06 DIAGNOSIS — N2581 Secondary hyperparathyroidism of renal origin: Secondary | ICD-10-CM | POA: Diagnosis not present

## 2018-02-06 DIAGNOSIS — N186 End stage renal disease: Secondary | ICD-10-CM | POA: Diagnosis not present

## 2018-02-06 DIAGNOSIS — D509 Iron deficiency anemia, unspecified: Secondary | ICD-10-CM | POA: Diagnosis not present

## 2018-02-09 DIAGNOSIS — D631 Anemia in chronic kidney disease: Secondary | ICD-10-CM | POA: Diagnosis not present

## 2018-02-09 DIAGNOSIS — D509 Iron deficiency anemia, unspecified: Secondary | ICD-10-CM | POA: Diagnosis not present

## 2018-02-09 DIAGNOSIS — N2581 Secondary hyperparathyroidism of renal origin: Secondary | ICD-10-CM | POA: Diagnosis not present

## 2018-02-09 DIAGNOSIS — N186 End stage renal disease: Secondary | ICD-10-CM | POA: Diagnosis not present

## 2018-02-11 DIAGNOSIS — N186 End stage renal disease: Secondary | ICD-10-CM | POA: Diagnosis not present

## 2018-02-11 DIAGNOSIS — N2581 Secondary hyperparathyroidism of renal origin: Secondary | ICD-10-CM | POA: Diagnosis not present

## 2018-02-11 DIAGNOSIS — D509 Iron deficiency anemia, unspecified: Secondary | ICD-10-CM | POA: Diagnosis not present

## 2018-02-11 DIAGNOSIS — D631 Anemia in chronic kidney disease: Secondary | ICD-10-CM | POA: Diagnosis not present

## 2018-02-12 DIAGNOSIS — N186 End stage renal disease: Secondary | ICD-10-CM

## 2018-02-12 DIAGNOSIS — Z01818 Encounter for other preprocedural examination: Secondary | ICD-10-CM

## 2018-02-12 DIAGNOSIS — Z7682 Awaiting organ transplant status: Principal | ICD-10-CM

## 2018-02-13 DIAGNOSIS — D631 Anemia in chronic kidney disease: Secondary | ICD-10-CM | POA: Diagnosis not present

## 2018-02-13 DIAGNOSIS — D509 Iron deficiency anemia, unspecified: Secondary | ICD-10-CM | POA: Diagnosis not present

## 2018-02-13 DIAGNOSIS — N2581 Secondary hyperparathyroidism of renal origin: Secondary | ICD-10-CM | POA: Diagnosis not present

## 2018-02-13 DIAGNOSIS — N186 End stage renal disease: Secondary | ICD-10-CM | POA: Diagnosis not present

## 2018-02-14 DIAGNOSIS — T8612 Kidney transplant failure: Secondary | ICD-10-CM | POA: Diagnosis not present

## 2018-02-14 DIAGNOSIS — N186 End stage renal disease: Secondary | ICD-10-CM | POA: Diagnosis not present

## 2018-02-14 DIAGNOSIS — Z992 Dependence on renal dialysis: Secondary | ICD-10-CM | POA: Diagnosis not present

## 2018-02-16 DIAGNOSIS — D509 Iron deficiency anemia, unspecified: Secondary | ICD-10-CM | POA: Diagnosis not present

## 2018-02-16 DIAGNOSIS — N186 End stage renal disease: Secondary | ICD-10-CM | POA: Diagnosis not present

## 2018-02-16 DIAGNOSIS — N2581 Secondary hyperparathyroidism of renal origin: Secondary | ICD-10-CM | POA: Diagnosis not present

## 2018-02-16 DIAGNOSIS — D631 Anemia in chronic kidney disease: Secondary | ICD-10-CM | POA: Diagnosis not present

## 2018-02-18 DIAGNOSIS — N186 End stage renal disease: Secondary | ICD-10-CM | POA: Diagnosis not present

## 2018-02-18 DIAGNOSIS — D509 Iron deficiency anemia, unspecified: Secondary | ICD-10-CM | POA: Diagnosis not present

## 2018-02-18 DIAGNOSIS — D631 Anemia in chronic kidney disease: Secondary | ICD-10-CM | POA: Diagnosis not present

## 2018-02-18 DIAGNOSIS — N2581 Secondary hyperparathyroidism of renal origin: Secondary | ICD-10-CM | POA: Diagnosis not present

## 2018-02-20 DIAGNOSIS — N2581 Secondary hyperparathyroidism of renal origin: Secondary | ICD-10-CM | POA: Diagnosis not present

## 2018-02-20 DIAGNOSIS — D509 Iron deficiency anemia, unspecified: Secondary | ICD-10-CM | POA: Diagnosis not present

## 2018-02-20 DIAGNOSIS — N186 End stage renal disease: Secondary | ICD-10-CM | POA: Diagnosis not present

## 2018-02-20 DIAGNOSIS — D631 Anemia in chronic kidney disease: Secondary | ICD-10-CM | POA: Diagnosis not present

## 2018-02-23 DIAGNOSIS — D631 Anemia in chronic kidney disease: Secondary | ICD-10-CM | POA: Diagnosis not present

## 2018-02-23 DIAGNOSIS — N2581 Secondary hyperparathyroidism of renal origin: Secondary | ICD-10-CM | POA: Diagnosis not present

## 2018-02-23 DIAGNOSIS — N186 End stage renal disease: Secondary | ICD-10-CM | POA: Diagnosis not present

## 2018-02-23 DIAGNOSIS — D509 Iron deficiency anemia, unspecified: Secondary | ICD-10-CM | POA: Diagnosis not present

## 2018-02-25 DIAGNOSIS — N186 End stage renal disease: Secondary | ICD-10-CM | POA: Diagnosis not present

## 2018-02-25 DIAGNOSIS — N2581 Secondary hyperparathyroidism of renal origin: Secondary | ICD-10-CM | POA: Diagnosis not present

## 2018-02-25 DIAGNOSIS — D509 Iron deficiency anemia, unspecified: Secondary | ICD-10-CM | POA: Diagnosis not present

## 2018-02-25 DIAGNOSIS — D631 Anemia in chronic kidney disease: Secondary | ICD-10-CM | POA: Diagnosis not present

## 2018-02-27 DIAGNOSIS — D509 Iron deficiency anemia, unspecified: Secondary | ICD-10-CM | POA: Diagnosis not present

## 2018-02-27 DIAGNOSIS — D631 Anemia in chronic kidney disease: Secondary | ICD-10-CM | POA: Diagnosis not present

## 2018-02-27 DIAGNOSIS — N186 End stage renal disease: Secondary | ICD-10-CM | POA: Diagnosis not present

## 2018-02-27 DIAGNOSIS — N2581 Secondary hyperparathyroidism of renal origin: Secondary | ICD-10-CM | POA: Diagnosis not present

## 2018-03-02 ENCOUNTER — Encounter: Admit: 2018-03-02 | Discharge: 2018-03-02 | Payer: MEDICARE | Attending: Nephrology | Primary: Nephrology

## 2018-03-02 DIAGNOSIS — N2581 Secondary hyperparathyroidism of renal origin: Secondary | ICD-10-CM | POA: Diagnosis not present

## 2018-03-02 DIAGNOSIS — N186 End stage renal disease: Secondary | ICD-10-CM | POA: Diagnosis not present

## 2018-03-02 DIAGNOSIS — D631 Anemia in chronic kidney disease: Secondary | ICD-10-CM | POA: Diagnosis not present

## 2018-03-02 DIAGNOSIS — D509 Iron deficiency anemia, unspecified: Secondary | ICD-10-CM | POA: Diagnosis not present

## 2018-03-04 DIAGNOSIS — N2581 Secondary hyperparathyroidism of renal origin: Secondary | ICD-10-CM | POA: Diagnosis not present

## 2018-03-04 DIAGNOSIS — N186 End stage renal disease: Secondary | ICD-10-CM | POA: Diagnosis not present

## 2018-03-04 DIAGNOSIS — D509 Iron deficiency anemia, unspecified: Secondary | ICD-10-CM | POA: Diagnosis not present

## 2018-03-04 DIAGNOSIS — D631 Anemia in chronic kidney disease: Secondary | ICD-10-CM | POA: Diagnosis not present

## 2018-03-06 DIAGNOSIS — D509 Iron deficiency anemia, unspecified: Secondary | ICD-10-CM | POA: Diagnosis not present

## 2018-03-06 DIAGNOSIS — N186 End stage renal disease: Secondary | ICD-10-CM | POA: Diagnosis not present

## 2018-03-06 DIAGNOSIS — N2581 Secondary hyperparathyroidism of renal origin: Secondary | ICD-10-CM | POA: Diagnosis not present

## 2018-03-06 DIAGNOSIS — D631 Anemia in chronic kidney disease: Secondary | ICD-10-CM | POA: Diagnosis not present

## 2018-03-08 DIAGNOSIS — N186 End stage renal disease: Secondary | ICD-10-CM

## 2018-03-08 DIAGNOSIS — Z01818 Encounter for other preprocedural examination: Secondary | ICD-10-CM

## 2018-03-08 DIAGNOSIS — Z7682 Awaiting organ transplant status: Principal | ICD-10-CM

## 2018-03-09 DIAGNOSIS — D509 Iron deficiency anemia, unspecified: Secondary | ICD-10-CM | POA: Diagnosis not present

## 2018-03-09 DIAGNOSIS — N2581 Secondary hyperparathyroidism of renal origin: Secondary | ICD-10-CM | POA: Diagnosis not present

## 2018-03-09 DIAGNOSIS — D631 Anemia in chronic kidney disease: Secondary | ICD-10-CM | POA: Diagnosis not present

## 2018-03-09 DIAGNOSIS — N186 End stage renal disease: Secondary | ICD-10-CM | POA: Diagnosis not present

## 2018-03-11 DIAGNOSIS — N186 End stage renal disease: Secondary | ICD-10-CM | POA: Diagnosis not present

## 2018-03-11 DIAGNOSIS — D631 Anemia in chronic kidney disease: Secondary | ICD-10-CM | POA: Diagnosis not present

## 2018-03-11 DIAGNOSIS — N2581 Secondary hyperparathyroidism of renal origin: Secondary | ICD-10-CM | POA: Diagnosis not present

## 2018-03-11 DIAGNOSIS — D509 Iron deficiency anemia, unspecified: Secondary | ICD-10-CM | POA: Diagnosis not present

## 2018-03-13 DIAGNOSIS — D509 Iron deficiency anemia, unspecified: Secondary | ICD-10-CM | POA: Diagnosis not present

## 2018-03-13 DIAGNOSIS — D631 Anemia in chronic kidney disease: Secondary | ICD-10-CM | POA: Diagnosis not present

## 2018-03-13 DIAGNOSIS — N2581 Secondary hyperparathyroidism of renal origin: Secondary | ICD-10-CM | POA: Diagnosis not present

## 2018-03-13 DIAGNOSIS — N186 End stage renal disease: Secondary | ICD-10-CM | POA: Diagnosis not present

## 2018-03-16 DIAGNOSIS — D631 Anemia in chronic kidney disease: Secondary | ICD-10-CM | POA: Diagnosis not present

## 2018-03-16 DIAGNOSIS — N186 End stage renal disease: Secondary | ICD-10-CM | POA: Diagnosis not present

## 2018-03-16 DIAGNOSIS — T8612 Kidney transplant failure: Secondary | ICD-10-CM | POA: Diagnosis not present

## 2018-03-16 DIAGNOSIS — N2581 Secondary hyperparathyroidism of renal origin: Secondary | ICD-10-CM | POA: Diagnosis not present

## 2018-03-16 DIAGNOSIS — Z23 Encounter for immunization: Secondary | ICD-10-CM | POA: Diagnosis not present

## 2018-03-16 DIAGNOSIS — Z992 Dependence on renal dialysis: Secondary | ICD-10-CM | POA: Diagnosis not present

## 2018-03-18 DIAGNOSIS — N186 End stage renal disease: Secondary | ICD-10-CM | POA: Diagnosis not present

## 2018-03-18 DIAGNOSIS — N2581 Secondary hyperparathyroidism of renal origin: Secondary | ICD-10-CM | POA: Diagnosis not present

## 2018-03-18 DIAGNOSIS — Z23 Encounter for immunization: Secondary | ICD-10-CM | POA: Diagnosis not present

## 2018-03-18 DIAGNOSIS — D631 Anemia in chronic kidney disease: Secondary | ICD-10-CM | POA: Diagnosis not present

## 2018-03-19 ENCOUNTER — Encounter: Admit: 2018-03-19 | Discharge: 2018-03-19 | Payer: MEDICARE | Attending: Surgery | Primary: Surgery

## 2018-03-19 DIAGNOSIS — Z992 Dependence on renal dialysis: Secondary | ICD-10-CM

## 2018-03-19 DIAGNOSIS — N186 End stage renal disease: Principal | ICD-10-CM

## 2018-03-20 DIAGNOSIS — N2581 Secondary hyperparathyroidism of renal origin: Secondary | ICD-10-CM | POA: Diagnosis not present

## 2018-03-20 DIAGNOSIS — Z23 Encounter for immunization: Secondary | ICD-10-CM | POA: Diagnosis not present

## 2018-03-20 DIAGNOSIS — D631 Anemia in chronic kidney disease: Secondary | ICD-10-CM | POA: Diagnosis not present

## 2018-03-20 DIAGNOSIS — N186 End stage renal disease: Secondary | ICD-10-CM | POA: Diagnosis not present

## 2018-03-22 ENCOUNTER — Ambulatory Visit: Admit: 2018-03-22 | Discharge: 2018-03-23 | Payer: MEDICARE

## 2018-03-22 ENCOUNTER — Ambulatory Visit: Admit: 2018-03-22 | Discharge: 2018-04-04 | Payer: MEDICARE

## 2018-03-22 ENCOUNTER — Ambulatory Visit: Admit: 2018-03-22 | Discharge: 2018-03-22 | Payer: MEDICARE

## 2018-03-22 DIAGNOSIS — Z01810 Encounter for preprocedural cardiovascular examination: Principal | ICD-10-CM

## 2018-03-22 DIAGNOSIS — N186 End stage renal disease: Secondary | ICD-10-CM

## 2018-03-22 DIAGNOSIS — Z7682 Awaiting organ transplant status: Secondary | ICD-10-CM

## 2018-03-22 DIAGNOSIS — Z992 Dependence on renal dialysis: Secondary | ICD-10-CM

## 2018-03-22 DIAGNOSIS — Z7289 Other problems related to lifestyle: Secondary | ICD-10-CM

## 2018-03-22 DIAGNOSIS — N189 Chronic kidney disease, unspecified: Secondary | ICD-10-CM | POA: Diagnosis not present

## 2018-03-22 DIAGNOSIS — I129 Hypertensive chronic kidney disease with stage 1 through stage 4 chronic kidney disease, or unspecified chronic kidney disease: Secondary | ICD-10-CM | POA: Diagnosis not present

## 2018-03-23 DIAGNOSIS — N186 End stage renal disease: Secondary | ICD-10-CM | POA: Diagnosis not present

## 2018-03-23 DIAGNOSIS — D631 Anemia in chronic kidney disease: Secondary | ICD-10-CM | POA: Diagnosis not present

## 2018-03-23 DIAGNOSIS — Z23 Encounter for immunization: Secondary | ICD-10-CM | POA: Diagnosis not present

## 2018-03-23 DIAGNOSIS — N2581 Secondary hyperparathyroidism of renal origin: Secondary | ICD-10-CM | POA: Diagnosis not present

## 2018-03-25 DIAGNOSIS — D631 Anemia in chronic kidney disease: Secondary | ICD-10-CM | POA: Diagnosis not present

## 2018-03-25 DIAGNOSIS — Z23 Encounter for immunization: Secondary | ICD-10-CM | POA: Diagnosis not present

## 2018-03-25 DIAGNOSIS — N186 End stage renal disease: Secondary | ICD-10-CM | POA: Diagnosis not present

## 2018-03-25 DIAGNOSIS — N2581 Secondary hyperparathyroidism of renal origin: Secondary | ICD-10-CM | POA: Diagnosis not present

## 2018-03-27 DIAGNOSIS — N186 End stage renal disease: Secondary | ICD-10-CM | POA: Diagnosis not present

## 2018-03-27 DIAGNOSIS — D631 Anemia in chronic kidney disease: Secondary | ICD-10-CM | POA: Diagnosis not present

## 2018-03-27 DIAGNOSIS — N2581 Secondary hyperparathyroidism of renal origin: Secondary | ICD-10-CM | POA: Diagnosis not present

## 2018-03-27 DIAGNOSIS — Z23 Encounter for immunization: Secondary | ICD-10-CM | POA: Diagnosis not present

## 2018-03-29 ENCOUNTER — Institutional Professional Consult (permissible substitution): Admit: 2018-03-29 | Discharge: 2018-03-30 | Payer: MEDICARE

## 2018-03-29 ENCOUNTER — Ambulatory Visit: Admit: 2018-03-29 | Discharge: 2018-03-30 | Payer: MEDICARE

## 2018-03-29 DIAGNOSIS — N186 End stage renal disease: Secondary | ICD-10-CM

## 2018-03-29 DIAGNOSIS — Z992 Dependence on renal dialysis: Secondary | ICD-10-CM

## 2018-03-29 DIAGNOSIS — T8612 Kidney transplant failure: Secondary | ICD-10-CM

## 2018-03-29 DIAGNOSIS — Z01818 Encounter for other preprocedural examination: Principal | ICD-10-CM

## 2018-03-30 DIAGNOSIS — D631 Anemia in chronic kidney disease: Secondary | ICD-10-CM | POA: Diagnosis not present

## 2018-03-30 DIAGNOSIS — N186 End stage renal disease: Secondary | ICD-10-CM | POA: Diagnosis not present

## 2018-03-30 DIAGNOSIS — N2581 Secondary hyperparathyroidism of renal origin: Secondary | ICD-10-CM | POA: Diagnosis not present

## 2018-03-30 DIAGNOSIS — Z23 Encounter for immunization: Secondary | ICD-10-CM | POA: Diagnosis not present

## 2018-04-01 DIAGNOSIS — N2581 Secondary hyperparathyroidism of renal origin: Secondary | ICD-10-CM | POA: Diagnosis not present

## 2018-04-01 DIAGNOSIS — Z23 Encounter for immunization: Secondary | ICD-10-CM | POA: Diagnosis not present

## 2018-04-01 DIAGNOSIS — D631 Anemia in chronic kidney disease: Secondary | ICD-10-CM | POA: Diagnosis not present

## 2018-04-01 DIAGNOSIS — N186 End stage renal disease: Secondary | ICD-10-CM | POA: Diagnosis not present

## 2018-04-03 DIAGNOSIS — N2581 Secondary hyperparathyroidism of renal origin: Secondary | ICD-10-CM | POA: Diagnosis not present

## 2018-04-03 DIAGNOSIS — Z23 Encounter for immunization: Secondary | ICD-10-CM | POA: Diagnosis not present

## 2018-04-03 DIAGNOSIS — D631 Anemia in chronic kidney disease: Secondary | ICD-10-CM | POA: Diagnosis not present

## 2018-04-03 DIAGNOSIS — N186 End stage renal disease: Secondary | ICD-10-CM | POA: Diagnosis not present

## 2018-04-06 ENCOUNTER — Ambulatory Visit: Admit: 2018-04-06 | Discharge: 2018-04-07 | Payer: MEDICARE | Attending: Gastroenterology | Primary: Gastroenterology

## 2018-04-06 ENCOUNTER — Encounter: Admit: 2018-04-06 | Discharge: 2018-04-06 | Payer: MEDICARE | Attending: Nephrology | Primary: Nephrology

## 2018-04-06 DIAGNOSIS — Z7682 Awaiting organ transplant status: Principal | ICD-10-CM

## 2018-04-06 DIAGNOSIS — D631 Anemia in chronic kidney disease: Secondary | ICD-10-CM | POA: Diagnosis not present

## 2018-04-06 DIAGNOSIS — N186 End stage renal disease: Secondary | ICD-10-CM | POA: Diagnosis not present

## 2018-04-06 DIAGNOSIS — N2581 Secondary hyperparathyroidism of renal origin: Secondary | ICD-10-CM | POA: Diagnosis not present

## 2018-04-06 DIAGNOSIS — Z23 Encounter for immunization: Secondary | ICD-10-CM | POA: Diagnosis not present

## 2018-04-07 DIAGNOSIS — N186 End stage renal disease: Secondary | ICD-10-CM | POA: Diagnosis not present

## 2018-04-07 DIAGNOSIS — Z7682 Awaiting organ transplant status: Secondary | ICD-10-CM | POA: Diagnosis not present

## 2018-04-08 DIAGNOSIS — N186 End stage renal disease: Secondary | ICD-10-CM | POA: Diagnosis not present

## 2018-04-08 DIAGNOSIS — Z23 Encounter for immunization: Secondary | ICD-10-CM | POA: Diagnosis not present

## 2018-04-08 DIAGNOSIS — D631 Anemia in chronic kidney disease: Secondary | ICD-10-CM | POA: Diagnosis not present

## 2018-04-08 DIAGNOSIS — N2581 Secondary hyperparathyroidism of renal origin: Secondary | ICD-10-CM | POA: Diagnosis not present

## 2018-04-09 DIAGNOSIS — N186 End stage renal disease: Secondary | ICD-10-CM

## 2018-04-09 DIAGNOSIS — Z7682 Awaiting organ transplant status: Principal | ICD-10-CM

## 2018-04-09 DIAGNOSIS — Z01818 Encounter for other preprocedural examination: Secondary | ICD-10-CM

## 2018-04-10 DIAGNOSIS — Z23 Encounter for immunization: Secondary | ICD-10-CM | POA: Diagnosis not present

## 2018-04-10 DIAGNOSIS — N2581 Secondary hyperparathyroidism of renal origin: Secondary | ICD-10-CM | POA: Diagnosis not present

## 2018-04-10 DIAGNOSIS — N186 End stage renal disease: Secondary | ICD-10-CM | POA: Diagnosis not present

## 2018-04-10 DIAGNOSIS — D631 Anemia in chronic kidney disease: Secondary | ICD-10-CM | POA: Diagnosis not present

## 2018-04-13 DIAGNOSIS — D631 Anemia in chronic kidney disease: Secondary | ICD-10-CM | POA: Diagnosis not present

## 2018-04-13 DIAGNOSIS — Z23 Encounter for immunization: Secondary | ICD-10-CM | POA: Diagnosis not present

## 2018-04-13 DIAGNOSIS — N186 End stage renal disease: Secondary | ICD-10-CM | POA: Diagnosis not present

## 2018-04-13 DIAGNOSIS — N2581 Secondary hyperparathyroidism of renal origin: Secondary | ICD-10-CM | POA: Diagnosis not present

## 2018-04-15 DIAGNOSIS — Z23 Encounter for immunization: Secondary | ICD-10-CM | POA: Diagnosis not present

## 2018-04-15 DIAGNOSIS — D631 Anemia in chronic kidney disease: Secondary | ICD-10-CM | POA: Diagnosis not present

## 2018-04-15 DIAGNOSIS — N2581 Secondary hyperparathyroidism of renal origin: Secondary | ICD-10-CM | POA: Diagnosis not present

## 2018-04-15 DIAGNOSIS — N186 End stage renal disease: Secondary | ICD-10-CM | POA: Diagnosis not present

## 2018-04-16 DIAGNOSIS — Z992 Dependence on renal dialysis: Secondary | ICD-10-CM | POA: Diagnosis not present

## 2018-04-16 DIAGNOSIS — N186 End stage renal disease: Secondary | ICD-10-CM | POA: Diagnosis not present

## 2018-04-16 DIAGNOSIS — T8612 Kidney transplant failure: Secondary | ICD-10-CM | POA: Diagnosis not present

## 2018-04-17 DIAGNOSIS — D509 Iron deficiency anemia, unspecified: Secondary | ICD-10-CM | POA: Diagnosis not present

## 2018-04-17 DIAGNOSIS — D631 Anemia in chronic kidney disease: Secondary | ICD-10-CM | POA: Diagnosis not present

## 2018-04-17 DIAGNOSIS — N2581 Secondary hyperparathyroidism of renal origin: Secondary | ICD-10-CM | POA: Diagnosis not present

## 2018-04-17 DIAGNOSIS — N186 End stage renal disease: Secondary | ICD-10-CM | POA: Diagnosis not present

## 2018-04-20 DIAGNOSIS — D509 Iron deficiency anemia, unspecified: Secondary | ICD-10-CM | POA: Diagnosis not present

## 2018-04-20 DIAGNOSIS — N186 End stage renal disease: Secondary | ICD-10-CM | POA: Diagnosis not present

## 2018-04-20 DIAGNOSIS — N2581 Secondary hyperparathyroidism of renal origin: Secondary | ICD-10-CM | POA: Diagnosis not present

## 2018-04-20 DIAGNOSIS — D631 Anemia in chronic kidney disease: Secondary | ICD-10-CM | POA: Diagnosis not present

## 2018-04-22 DIAGNOSIS — D509 Iron deficiency anemia, unspecified: Secondary | ICD-10-CM | POA: Diagnosis not present

## 2018-04-22 DIAGNOSIS — N2581 Secondary hyperparathyroidism of renal origin: Secondary | ICD-10-CM | POA: Diagnosis not present

## 2018-04-22 DIAGNOSIS — N186 End stage renal disease: Secondary | ICD-10-CM | POA: Diagnosis not present

## 2018-04-22 DIAGNOSIS — D631 Anemia in chronic kidney disease: Secondary | ICD-10-CM | POA: Diagnosis not present

## 2018-04-24 DIAGNOSIS — D509 Iron deficiency anemia, unspecified: Secondary | ICD-10-CM | POA: Diagnosis not present

## 2018-04-24 DIAGNOSIS — D631 Anemia in chronic kidney disease: Secondary | ICD-10-CM | POA: Diagnosis not present

## 2018-04-24 DIAGNOSIS — N2581 Secondary hyperparathyroidism of renal origin: Secondary | ICD-10-CM | POA: Diagnosis not present

## 2018-04-24 DIAGNOSIS — N186 End stage renal disease: Secondary | ICD-10-CM | POA: Diagnosis not present

## 2018-04-27 DIAGNOSIS — D509 Iron deficiency anemia, unspecified: Secondary | ICD-10-CM | POA: Diagnosis not present

## 2018-04-27 DIAGNOSIS — N186 End stage renal disease: Secondary | ICD-10-CM | POA: Diagnosis not present

## 2018-04-27 DIAGNOSIS — N2581 Secondary hyperparathyroidism of renal origin: Secondary | ICD-10-CM | POA: Diagnosis not present

## 2018-04-27 DIAGNOSIS — D631 Anemia in chronic kidney disease: Secondary | ICD-10-CM | POA: Diagnosis not present

## 2018-04-29 DIAGNOSIS — N2581 Secondary hyperparathyroidism of renal origin: Secondary | ICD-10-CM | POA: Diagnosis not present

## 2018-04-29 DIAGNOSIS — N186 End stage renal disease: Secondary | ICD-10-CM | POA: Diagnosis not present

## 2018-04-29 DIAGNOSIS — D631 Anemia in chronic kidney disease: Secondary | ICD-10-CM | POA: Diagnosis not present

## 2018-04-29 DIAGNOSIS — D509 Iron deficiency anemia, unspecified: Secondary | ICD-10-CM | POA: Diagnosis not present

## 2018-05-01 DIAGNOSIS — N186 End stage renal disease: Secondary | ICD-10-CM | POA: Diagnosis not present

## 2018-05-01 DIAGNOSIS — N2581 Secondary hyperparathyroidism of renal origin: Secondary | ICD-10-CM | POA: Diagnosis not present

## 2018-05-01 DIAGNOSIS — D509 Iron deficiency anemia, unspecified: Secondary | ICD-10-CM | POA: Diagnosis not present

## 2018-05-01 DIAGNOSIS — D631 Anemia in chronic kidney disease: Secondary | ICD-10-CM | POA: Diagnosis not present

## 2018-05-04 ENCOUNTER — Encounter: Admit: 2018-05-04 | Discharge: 2018-05-04 | Payer: MEDICARE | Attending: Nephrology | Primary: Nephrology

## 2018-05-04 DIAGNOSIS — D631 Anemia in chronic kidney disease: Secondary | ICD-10-CM | POA: Diagnosis not present

## 2018-05-04 DIAGNOSIS — N2581 Secondary hyperparathyroidism of renal origin: Secondary | ICD-10-CM | POA: Diagnosis not present

## 2018-05-04 DIAGNOSIS — N186 End stage renal disease: Secondary | ICD-10-CM | POA: Diagnosis not present

## 2018-05-04 DIAGNOSIS — D509 Iron deficiency anemia, unspecified: Secondary | ICD-10-CM | POA: Diagnosis not present

## 2018-05-06 DIAGNOSIS — D509 Iron deficiency anemia, unspecified: Secondary | ICD-10-CM | POA: Diagnosis not present

## 2018-05-06 DIAGNOSIS — N186 End stage renal disease: Secondary | ICD-10-CM | POA: Diagnosis not present

## 2018-05-06 DIAGNOSIS — D631 Anemia in chronic kidney disease: Secondary | ICD-10-CM | POA: Diagnosis not present

## 2018-05-06 DIAGNOSIS — N2581 Secondary hyperparathyroidism of renal origin: Secondary | ICD-10-CM | POA: Diagnosis not present

## 2018-05-07 DIAGNOSIS — Z01818 Encounter for other preprocedural examination: Principal | ICD-10-CM

## 2018-05-07 DIAGNOSIS — Z7682 Awaiting organ transplant status: Secondary | ICD-10-CM

## 2018-05-07 DIAGNOSIS — N186 End stage renal disease: Secondary | ICD-10-CM

## 2018-05-08 DIAGNOSIS — N186 End stage renal disease: Secondary | ICD-10-CM | POA: Diagnosis not present

## 2018-05-08 DIAGNOSIS — N2581 Secondary hyperparathyroidism of renal origin: Secondary | ICD-10-CM | POA: Diagnosis not present

## 2018-05-08 DIAGNOSIS — D631 Anemia in chronic kidney disease: Secondary | ICD-10-CM | POA: Diagnosis not present

## 2018-05-08 DIAGNOSIS — D509 Iron deficiency anemia, unspecified: Secondary | ICD-10-CM | POA: Diagnosis not present

## 2018-05-10 DIAGNOSIS — N2581 Secondary hyperparathyroidism of renal origin: Secondary | ICD-10-CM | POA: Diagnosis not present

## 2018-05-10 DIAGNOSIS — N186 End stage renal disease: Secondary | ICD-10-CM | POA: Diagnosis not present

## 2018-05-10 DIAGNOSIS — D509 Iron deficiency anemia, unspecified: Secondary | ICD-10-CM | POA: Diagnosis not present

## 2018-05-10 DIAGNOSIS — D631 Anemia in chronic kidney disease: Secondary | ICD-10-CM | POA: Diagnosis not present

## 2018-05-12 DIAGNOSIS — N2581 Secondary hyperparathyroidism of renal origin: Secondary | ICD-10-CM | POA: Diagnosis not present

## 2018-05-12 DIAGNOSIS — N186 End stage renal disease: Secondary | ICD-10-CM | POA: Diagnosis not present

## 2018-05-12 DIAGNOSIS — D509 Iron deficiency anemia, unspecified: Secondary | ICD-10-CM | POA: Diagnosis not present

## 2018-05-12 DIAGNOSIS — D631 Anemia in chronic kidney disease: Secondary | ICD-10-CM | POA: Diagnosis not present

## 2018-05-15 DIAGNOSIS — N186 End stage renal disease: Secondary | ICD-10-CM | POA: Diagnosis not present

## 2018-05-15 DIAGNOSIS — D631 Anemia in chronic kidney disease: Secondary | ICD-10-CM | POA: Diagnosis not present

## 2018-05-15 DIAGNOSIS — N2581 Secondary hyperparathyroidism of renal origin: Secondary | ICD-10-CM | POA: Diagnosis not present

## 2018-05-15 DIAGNOSIS — D509 Iron deficiency anemia, unspecified: Secondary | ICD-10-CM | POA: Diagnosis not present

## 2018-05-16 DIAGNOSIS — N186 End stage renal disease: Secondary | ICD-10-CM | POA: Diagnosis not present

## 2018-05-16 DIAGNOSIS — T8612 Kidney transplant failure: Secondary | ICD-10-CM | POA: Diagnosis not present

## 2018-05-16 DIAGNOSIS — Z992 Dependence on renal dialysis: Secondary | ICD-10-CM | POA: Diagnosis not present

## 2018-05-18 DIAGNOSIS — N2581 Secondary hyperparathyroidism of renal origin: Secondary | ICD-10-CM | POA: Diagnosis not present

## 2018-05-18 DIAGNOSIS — D509 Iron deficiency anemia, unspecified: Secondary | ICD-10-CM | POA: Diagnosis not present

## 2018-05-18 DIAGNOSIS — N186 End stage renal disease: Secondary | ICD-10-CM | POA: Diagnosis not present

## 2018-05-18 DIAGNOSIS — D631 Anemia in chronic kidney disease: Secondary | ICD-10-CM | POA: Diagnosis not present

## 2018-05-20 DIAGNOSIS — D509 Iron deficiency anemia, unspecified: Secondary | ICD-10-CM | POA: Diagnosis not present

## 2018-05-20 DIAGNOSIS — D631 Anemia in chronic kidney disease: Secondary | ICD-10-CM | POA: Diagnosis not present

## 2018-05-20 DIAGNOSIS — N2581 Secondary hyperparathyroidism of renal origin: Secondary | ICD-10-CM | POA: Diagnosis not present

## 2018-05-20 DIAGNOSIS — N186 End stage renal disease: Secondary | ICD-10-CM | POA: Diagnosis not present

## 2018-05-22 DIAGNOSIS — D509 Iron deficiency anemia, unspecified: Secondary | ICD-10-CM | POA: Diagnosis not present

## 2018-05-22 DIAGNOSIS — D631 Anemia in chronic kidney disease: Secondary | ICD-10-CM | POA: Diagnosis not present

## 2018-05-22 DIAGNOSIS — N2581 Secondary hyperparathyroidism of renal origin: Secondary | ICD-10-CM | POA: Diagnosis not present

## 2018-05-22 DIAGNOSIS — N186 End stage renal disease: Secondary | ICD-10-CM | POA: Diagnosis not present

## 2018-05-25 ENCOUNTER — Encounter: Admit: 2018-05-25 | Discharge: 2018-05-25 | Payer: MEDICARE | Attending: Nephrology | Primary: Nephrology

## 2018-05-25 DIAGNOSIS — D509 Iron deficiency anemia, unspecified: Secondary | ICD-10-CM | POA: Diagnosis not present

## 2018-05-25 DIAGNOSIS — N2581 Secondary hyperparathyroidism of renal origin: Secondary | ICD-10-CM | POA: Diagnosis not present

## 2018-05-25 DIAGNOSIS — D631 Anemia in chronic kidney disease: Secondary | ICD-10-CM | POA: Diagnosis not present

## 2018-05-25 DIAGNOSIS — N186 End stage renal disease: Secondary | ICD-10-CM | POA: Diagnosis not present

## 2018-05-27 DIAGNOSIS — D509 Iron deficiency anemia, unspecified: Secondary | ICD-10-CM | POA: Diagnosis not present

## 2018-05-27 DIAGNOSIS — N2581 Secondary hyperparathyroidism of renal origin: Secondary | ICD-10-CM | POA: Diagnosis not present

## 2018-05-27 DIAGNOSIS — N186 End stage renal disease: Secondary | ICD-10-CM | POA: Diagnosis not present

## 2018-05-27 DIAGNOSIS — D631 Anemia in chronic kidney disease: Secondary | ICD-10-CM | POA: Diagnosis not present

## 2018-05-28 DIAGNOSIS — Z01818 Encounter for other preprocedural examination: Secondary | ICD-10-CM

## 2018-05-28 DIAGNOSIS — N186 End stage renal disease: Secondary | ICD-10-CM

## 2018-05-28 DIAGNOSIS — Z7682 Awaiting organ transplant status: Principal | ICD-10-CM

## 2018-05-29 DIAGNOSIS — N2581 Secondary hyperparathyroidism of renal origin: Secondary | ICD-10-CM | POA: Diagnosis not present

## 2018-05-29 DIAGNOSIS — N186 End stage renal disease: Secondary | ICD-10-CM | POA: Diagnosis not present

## 2018-05-29 DIAGNOSIS — D509 Iron deficiency anemia, unspecified: Secondary | ICD-10-CM | POA: Diagnosis not present

## 2018-05-29 DIAGNOSIS — D631 Anemia in chronic kidney disease: Secondary | ICD-10-CM | POA: Diagnosis not present

## 2018-06-01 DIAGNOSIS — N186 End stage renal disease: Secondary | ICD-10-CM | POA: Diagnosis not present

## 2018-06-01 DIAGNOSIS — D631 Anemia in chronic kidney disease: Secondary | ICD-10-CM | POA: Diagnosis not present

## 2018-06-01 DIAGNOSIS — D509 Iron deficiency anemia, unspecified: Secondary | ICD-10-CM | POA: Diagnosis not present

## 2018-06-01 DIAGNOSIS — N2581 Secondary hyperparathyroidism of renal origin: Secondary | ICD-10-CM | POA: Diagnosis not present

## 2018-06-03 DIAGNOSIS — N186 End stage renal disease: Secondary | ICD-10-CM | POA: Diagnosis not present

## 2018-06-03 DIAGNOSIS — D509 Iron deficiency anemia, unspecified: Secondary | ICD-10-CM | POA: Diagnosis not present

## 2018-06-03 DIAGNOSIS — N2581 Secondary hyperparathyroidism of renal origin: Secondary | ICD-10-CM | POA: Diagnosis not present

## 2018-06-03 DIAGNOSIS — D631 Anemia in chronic kidney disease: Secondary | ICD-10-CM | POA: Diagnosis not present

## 2018-06-05 DIAGNOSIS — N186 End stage renal disease: Secondary | ICD-10-CM | POA: Diagnosis not present

## 2018-06-05 DIAGNOSIS — D509 Iron deficiency anemia, unspecified: Secondary | ICD-10-CM | POA: Diagnosis not present

## 2018-06-05 DIAGNOSIS — N2581 Secondary hyperparathyroidism of renal origin: Secondary | ICD-10-CM | POA: Diagnosis not present

## 2018-06-05 DIAGNOSIS — D631 Anemia in chronic kidney disease: Secondary | ICD-10-CM | POA: Diagnosis not present

## 2018-06-07 DIAGNOSIS — D631 Anemia in chronic kidney disease: Secondary | ICD-10-CM | POA: Diagnosis not present

## 2018-06-07 DIAGNOSIS — N186 End stage renal disease: Secondary | ICD-10-CM | POA: Diagnosis not present

## 2018-06-07 DIAGNOSIS — D509 Iron deficiency anemia, unspecified: Secondary | ICD-10-CM | POA: Diagnosis not present

## 2018-06-07 DIAGNOSIS — N2581 Secondary hyperparathyroidism of renal origin: Secondary | ICD-10-CM | POA: Diagnosis not present

## 2018-06-10 DIAGNOSIS — D631 Anemia in chronic kidney disease: Secondary | ICD-10-CM | POA: Diagnosis not present

## 2018-06-10 DIAGNOSIS — N186 End stage renal disease: Secondary | ICD-10-CM | POA: Diagnosis not present

## 2018-06-10 DIAGNOSIS — D509 Iron deficiency anemia, unspecified: Secondary | ICD-10-CM | POA: Diagnosis not present

## 2018-06-10 DIAGNOSIS — N2581 Secondary hyperparathyroidism of renal origin: Secondary | ICD-10-CM | POA: Diagnosis not present

## 2018-06-12 DIAGNOSIS — N186 End stage renal disease: Secondary | ICD-10-CM | POA: Diagnosis not present

## 2018-06-12 DIAGNOSIS — D509 Iron deficiency anemia, unspecified: Secondary | ICD-10-CM | POA: Diagnosis not present

## 2018-06-12 DIAGNOSIS — D631 Anemia in chronic kidney disease: Secondary | ICD-10-CM | POA: Diagnosis not present

## 2018-06-12 DIAGNOSIS — N2581 Secondary hyperparathyroidism of renal origin: Secondary | ICD-10-CM | POA: Diagnosis not present

## 2018-06-14 DIAGNOSIS — N186 End stage renal disease: Secondary | ICD-10-CM | POA: Diagnosis not present

## 2018-06-14 DIAGNOSIS — D631 Anemia in chronic kidney disease: Secondary | ICD-10-CM | POA: Diagnosis not present

## 2018-06-14 DIAGNOSIS — N2581 Secondary hyperparathyroidism of renal origin: Secondary | ICD-10-CM | POA: Diagnosis not present

## 2018-06-14 DIAGNOSIS — D509 Iron deficiency anemia, unspecified: Secondary | ICD-10-CM | POA: Diagnosis not present

## 2018-06-16 DIAGNOSIS — T8612 Kidney transplant failure: Secondary | ICD-10-CM | POA: Diagnosis not present

## 2018-06-16 DIAGNOSIS — N186 End stage renal disease: Secondary | ICD-10-CM | POA: Diagnosis not present

## 2018-06-16 DIAGNOSIS — Z992 Dependence on renal dialysis: Secondary | ICD-10-CM | POA: Diagnosis not present

## 2018-06-17 DIAGNOSIS — D509 Iron deficiency anemia, unspecified: Secondary | ICD-10-CM | POA: Diagnosis not present

## 2018-06-17 DIAGNOSIS — N2581 Secondary hyperparathyroidism of renal origin: Secondary | ICD-10-CM | POA: Diagnosis not present

## 2018-06-17 DIAGNOSIS — N186 End stage renal disease: Secondary | ICD-10-CM | POA: Diagnosis not present

## 2018-06-17 DIAGNOSIS — D631 Anemia in chronic kidney disease: Secondary | ICD-10-CM | POA: Diagnosis not present

## 2018-06-19 DIAGNOSIS — N186 End stage renal disease: Secondary | ICD-10-CM | POA: Diagnosis not present

## 2018-06-19 DIAGNOSIS — D631 Anemia in chronic kidney disease: Secondary | ICD-10-CM | POA: Diagnosis not present

## 2018-06-19 DIAGNOSIS — N2581 Secondary hyperparathyroidism of renal origin: Secondary | ICD-10-CM | POA: Diagnosis not present

## 2018-06-19 DIAGNOSIS — D509 Iron deficiency anemia, unspecified: Secondary | ICD-10-CM | POA: Diagnosis not present

## 2018-06-22 DIAGNOSIS — D631 Anemia in chronic kidney disease: Secondary | ICD-10-CM | POA: Diagnosis not present

## 2018-06-22 DIAGNOSIS — N2581 Secondary hyperparathyroidism of renal origin: Secondary | ICD-10-CM | POA: Diagnosis not present

## 2018-06-22 DIAGNOSIS — N186 End stage renal disease: Secondary | ICD-10-CM | POA: Diagnosis not present

## 2018-06-22 DIAGNOSIS — D509 Iron deficiency anemia, unspecified: Secondary | ICD-10-CM | POA: Diagnosis not present

## 2018-06-24 DIAGNOSIS — N186 End stage renal disease: Secondary | ICD-10-CM | POA: Diagnosis not present

## 2018-06-24 DIAGNOSIS — D509 Iron deficiency anemia, unspecified: Secondary | ICD-10-CM | POA: Diagnosis not present

## 2018-06-24 DIAGNOSIS — N2581 Secondary hyperparathyroidism of renal origin: Secondary | ICD-10-CM | POA: Diagnosis not present

## 2018-06-24 DIAGNOSIS — D631 Anemia in chronic kidney disease: Secondary | ICD-10-CM | POA: Diagnosis not present

## 2018-06-26 DIAGNOSIS — D509 Iron deficiency anemia, unspecified: Secondary | ICD-10-CM | POA: Diagnosis not present

## 2018-06-26 DIAGNOSIS — N2581 Secondary hyperparathyroidism of renal origin: Secondary | ICD-10-CM | POA: Diagnosis not present

## 2018-06-26 DIAGNOSIS — D631 Anemia in chronic kidney disease: Secondary | ICD-10-CM | POA: Diagnosis not present

## 2018-06-26 DIAGNOSIS — N186 End stage renal disease: Secondary | ICD-10-CM | POA: Diagnosis not present

## 2018-06-29 ENCOUNTER — Encounter: Admit: 2018-06-29 | Discharge: 2018-06-29 | Payer: MEDICARE | Attending: Nephrology | Primary: Nephrology

## 2018-06-29 DIAGNOSIS — D509 Iron deficiency anemia, unspecified: Secondary | ICD-10-CM | POA: Diagnosis not present

## 2018-06-29 DIAGNOSIS — D631 Anemia in chronic kidney disease: Secondary | ICD-10-CM | POA: Diagnosis not present

## 2018-06-29 DIAGNOSIS — N2581 Secondary hyperparathyroidism of renal origin: Secondary | ICD-10-CM | POA: Diagnosis not present

## 2018-06-29 DIAGNOSIS — N186 End stage renal disease: Secondary | ICD-10-CM | POA: Diagnosis not present

## 2018-07-01 DIAGNOSIS — D509 Iron deficiency anemia, unspecified: Secondary | ICD-10-CM | POA: Diagnosis not present

## 2018-07-01 DIAGNOSIS — D631 Anemia in chronic kidney disease: Secondary | ICD-10-CM | POA: Diagnosis not present

## 2018-07-01 DIAGNOSIS — N2581 Secondary hyperparathyroidism of renal origin: Secondary | ICD-10-CM | POA: Diagnosis not present

## 2018-07-01 DIAGNOSIS — N186 End stage renal disease: Secondary | ICD-10-CM | POA: Diagnosis not present

## 2018-07-03 DIAGNOSIS — N2581 Secondary hyperparathyroidism of renal origin: Secondary | ICD-10-CM | POA: Diagnosis not present

## 2018-07-03 DIAGNOSIS — D509 Iron deficiency anemia, unspecified: Secondary | ICD-10-CM | POA: Diagnosis not present

## 2018-07-03 DIAGNOSIS — D631 Anemia in chronic kidney disease: Secondary | ICD-10-CM | POA: Diagnosis not present

## 2018-07-03 DIAGNOSIS — N186 End stage renal disease: Secondary | ICD-10-CM | POA: Diagnosis not present

## 2018-07-06 DIAGNOSIS — D631 Anemia in chronic kidney disease: Secondary | ICD-10-CM | POA: Diagnosis not present

## 2018-07-06 DIAGNOSIS — N186 End stage renal disease: Secondary | ICD-10-CM | POA: Diagnosis not present

## 2018-07-06 DIAGNOSIS — D509 Iron deficiency anemia, unspecified: Secondary | ICD-10-CM | POA: Diagnosis not present

## 2018-07-06 DIAGNOSIS — N2581 Secondary hyperparathyroidism of renal origin: Secondary | ICD-10-CM | POA: Diagnosis not present

## 2018-07-08 DIAGNOSIS — N186 End stage renal disease: Secondary | ICD-10-CM

## 2018-07-08 DIAGNOSIS — Z7682 Awaiting organ transplant status: Principal | ICD-10-CM

## 2018-07-08 DIAGNOSIS — Z01818 Encounter for other preprocedural examination: Secondary | ICD-10-CM

## 2018-07-08 DIAGNOSIS — D509 Iron deficiency anemia, unspecified: Secondary | ICD-10-CM | POA: Diagnosis not present

## 2018-07-08 DIAGNOSIS — N2581 Secondary hyperparathyroidism of renal origin: Secondary | ICD-10-CM | POA: Diagnosis not present

## 2018-07-08 DIAGNOSIS — D631 Anemia in chronic kidney disease: Secondary | ICD-10-CM | POA: Diagnosis not present

## 2018-07-10 DIAGNOSIS — D631 Anemia in chronic kidney disease: Secondary | ICD-10-CM | POA: Diagnosis not present

## 2018-07-10 DIAGNOSIS — N2581 Secondary hyperparathyroidism of renal origin: Secondary | ICD-10-CM | POA: Diagnosis not present

## 2018-07-10 DIAGNOSIS — D509 Iron deficiency anemia, unspecified: Secondary | ICD-10-CM | POA: Diagnosis not present

## 2018-07-10 DIAGNOSIS — N186 End stage renal disease: Secondary | ICD-10-CM | POA: Diagnosis not present

## 2018-07-13 DIAGNOSIS — D631 Anemia in chronic kidney disease: Secondary | ICD-10-CM | POA: Diagnosis not present

## 2018-07-13 DIAGNOSIS — N186 End stage renal disease: Secondary | ICD-10-CM | POA: Diagnosis not present

## 2018-07-13 DIAGNOSIS — D509 Iron deficiency anemia, unspecified: Secondary | ICD-10-CM | POA: Diagnosis not present

## 2018-07-13 DIAGNOSIS — N2581 Secondary hyperparathyroidism of renal origin: Secondary | ICD-10-CM | POA: Diagnosis not present

## 2018-07-15 DIAGNOSIS — D509 Iron deficiency anemia, unspecified: Secondary | ICD-10-CM | POA: Diagnosis not present

## 2018-07-15 DIAGNOSIS — N2581 Secondary hyperparathyroidism of renal origin: Secondary | ICD-10-CM | POA: Diagnosis not present

## 2018-07-15 DIAGNOSIS — N186 End stage renal disease: Secondary | ICD-10-CM | POA: Diagnosis not present

## 2018-07-15 DIAGNOSIS — D631 Anemia in chronic kidney disease: Secondary | ICD-10-CM | POA: Diagnosis not present

## 2018-07-17 DIAGNOSIS — N2581 Secondary hyperparathyroidism of renal origin: Secondary | ICD-10-CM | POA: Diagnosis not present

## 2018-07-17 DIAGNOSIS — D509 Iron deficiency anemia, unspecified: Secondary | ICD-10-CM | POA: Diagnosis not present

## 2018-07-17 DIAGNOSIS — Z992 Dependence on renal dialysis: Secondary | ICD-10-CM | POA: Diagnosis not present

## 2018-07-17 DIAGNOSIS — T8612 Kidney transplant failure: Secondary | ICD-10-CM | POA: Diagnosis not present

## 2018-07-17 DIAGNOSIS — D631 Anemia in chronic kidney disease: Secondary | ICD-10-CM | POA: Diagnosis not present

## 2018-07-17 DIAGNOSIS — N186 End stage renal disease: Secondary | ICD-10-CM | POA: Diagnosis not present

## 2018-07-20 DIAGNOSIS — D631 Anemia in chronic kidney disease: Secondary | ICD-10-CM | POA: Diagnosis not present

## 2018-07-20 DIAGNOSIS — N186 End stage renal disease: Secondary | ICD-10-CM | POA: Diagnosis not present

## 2018-07-20 DIAGNOSIS — N2581 Secondary hyperparathyroidism of renal origin: Secondary | ICD-10-CM | POA: Diagnosis not present

## 2018-07-20 DIAGNOSIS — D509 Iron deficiency anemia, unspecified: Secondary | ICD-10-CM | POA: Diagnosis not present

## 2018-07-22 DIAGNOSIS — N186 End stage renal disease: Secondary | ICD-10-CM | POA: Diagnosis not present

## 2018-07-22 DIAGNOSIS — N2581 Secondary hyperparathyroidism of renal origin: Secondary | ICD-10-CM | POA: Diagnosis not present

## 2018-07-22 DIAGNOSIS — D631 Anemia in chronic kidney disease: Secondary | ICD-10-CM | POA: Diagnosis not present

## 2018-07-22 DIAGNOSIS — D509 Iron deficiency anemia, unspecified: Secondary | ICD-10-CM | POA: Diagnosis not present

## 2018-07-24 DIAGNOSIS — D509 Iron deficiency anemia, unspecified: Secondary | ICD-10-CM | POA: Diagnosis not present

## 2018-07-24 DIAGNOSIS — D631 Anemia in chronic kidney disease: Secondary | ICD-10-CM | POA: Diagnosis not present

## 2018-07-24 DIAGNOSIS — N2581 Secondary hyperparathyroidism of renal origin: Secondary | ICD-10-CM | POA: Diagnosis not present

## 2018-07-24 DIAGNOSIS — N186 End stage renal disease: Secondary | ICD-10-CM | POA: Diagnosis not present

## 2018-07-27 DIAGNOSIS — N186 End stage renal disease: Secondary | ICD-10-CM | POA: Diagnosis not present

## 2018-07-27 DIAGNOSIS — N2581 Secondary hyperparathyroidism of renal origin: Secondary | ICD-10-CM | POA: Diagnosis not present

## 2018-07-27 DIAGNOSIS — D509 Iron deficiency anemia, unspecified: Secondary | ICD-10-CM | POA: Diagnosis not present

## 2018-07-27 DIAGNOSIS — D631 Anemia in chronic kidney disease: Secondary | ICD-10-CM | POA: Diagnosis not present

## 2018-07-29 DIAGNOSIS — N2581 Secondary hyperparathyroidism of renal origin: Secondary | ICD-10-CM | POA: Diagnosis not present

## 2018-07-29 DIAGNOSIS — D509 Iron deficiency anemia, unspecified: Secondary | ICD-10-CM | POA: Diagnosis not present

## 2018-07-29 DIAGNOSIS — D631 Anemia in chronic kidney disease: Secondary | ICD-10-CM | POA: Diagnosis not present

## 2018-07-29 DIAGNOSIS — N186 End stage renal disease: Secondary | ICD-10-CM | POA: Diagnosis not present

## 2018-07-31 DIAGNOSIS — N186 End stage renal disease: Secondary | ICD-10-CM | POA: Diagnosis not present

## 2018-07-31 DIAGNOSIS — N2581 Secondary hyperparathyroidism of renal origin: Secondary | ICD-10-CM | POA: Diagnosis not present

## 2018-07-31 DIAGNOSIS — D631 Anemia in chronic kidney disease: Secondary | ICD-10-CM | POA: Diagnosis not present

## 2018-07-31 DIAGNOSIS — D509 Iron deficiency anemia, unspecified: Secondary | ICD-10-CM | POA: Diagnosis not present

## 2018-08-03 ENCOUNTER — Encounter: Admit: 2018-08-03 | Discharge: 2018-08-03 | Payer: MEDICARE | Attending: Nephrology | Primary: Nephrology

## 2018-08-03 DIAGNOSIS — N186 End stage renal disease: Secondary | ICD-10-CM | POA: Diagnosis not present

## 2018-08-03 DIAGNOSIS — N2581 Secondary hyperparathyroidism of renal origin: Secondary | ICD-10-CM | POA: Diagnosis not present

## 2018-08-03 DIAGNOSIS — D509 Iron deficiency anemia, unspecified: Secondary | ICD-10-CM | POA: Diagnosis not present

## 2018-08-03 DIAGNOSIS — D631 Anemia in chronic kidney disease: Secondary | ICD-10-CM | POA: Diagnosis not present

## 2018-08-05 DIAGNOSIS — D631 Anemia in chronic kidney disease: Secondary | ICD-10-CM | POA: Diagnosis not present

## 2018-08-05 DIAGNOSIS — D509 Iron deficiency anemia, unspecified: Secondary | ICD-10-CM | POA: Diagnosis not present

## 2018-08-05 DIAGNOSIS — N186 End stage renal disease: Secondary | ICD-10-CM | POA: Diagnosis not present

## 2018-08-05 DIAGNOSIS — N2581 Secondary hyperparathyroidism of renal origin: Secondary | ICD-10-CM | POA: Diagnosis not present

## 2018-08-06 DIAGNOSIS — Z7682 Awaiting organ transplant status: Principal | ICD-10-CM

## 2018-08-06 DIAGNOSIS — Z01818 Encounter for other preprocedural examination: Secondary | ICD-10-CM

## 2018-08-06 DIAGNOSIS — N186 End stage renal disease: Secondary | ICD-10-CM

## 2018-08-07 DIAGNOSIS — N186 End stage renal disease: Secondary | ICD-10-CM | POA: Diagnosis not present

## 2018-08-07 DIAGNOSIS — D509 Iron deficiency anemia, unspecified: Secondary | ICD-10-CM | POA: Diagnosis not present

## 2018-08-07 DIAGNOSIS — D631 Anemia in chronic kidney disease: Secondary | ICD-10-CM | POA: Diagnosis not present

## 2018-08-07 DIAGNOSIS — N2581 Secondary hyperparathyroidism of renal origin: Secondary | ICD-10-CM | POA: Diagnosis not present

## 2018-08-10 DIAGNOSIS — D509 Iron deficiency anemia, unspecified: Secondary | ICD-10-CM | POA: Diagnosis not present

## 2018-08-10 DIAGNOSIS — D631 Anemia in chronic kidney disease: Secondary | ICD-10-CM | POA: Diagnosis not present

## 2018-08-10 DIAGNOSIS — N186 End stage renal disease: Secondary | ICD-10-CM | POA: Diagnosis not present

## 2018-08-10 DIAGNOSIS — N2581 Secondary hyperparathyroidism of renal origin: Secondary | ICD-10-CM | POA: Diagnosis not present

## 2018-08-12 DIAGNOSIS — N2581 Secondary hyperparathyroidism of renal origin: Secondary | ICD-10-CM | POA: Diagnosis not present

## 2018-08-12 DIAGNOSIS — N186 End stage renal disease: Secondary | ICD-10-CM | POA: Diagnosis not present

## 2018-08-12 DIAGNOSIS — D631 Anemia in chronic kidney disease: Secondary | ICD-10-CM | POA: Diagnosis not present

## 2018-08-12 DIAGNOSIS — D509 Iron deficiency anemia, unspecified: Secondary | ICD-10-CM | POA: Diagnosis not present

## 2018-08-14 DIAGNOSIS — N2581 Secondary hyperparathyroidism of renal origin: Secondary | ICD-10-CM | POA: Diagnosis not present

## 2018-08-14 DIAGNOSIS — D509 Iron deficiency anemia, unspecified: Secondary | ICD-10-CM | POA: Diagnosis not present

## 2018-08-14 DIAGNOSIS — D631 Anemia in chronic kidney disease: Secondary | ICD-10-CM | POA: Diagnosis not present

## 2018-08-14 DIAGNOSIS — N186 End stage renal disease: Secondary | ICD-10-CM | POA: Diagnosis not present

## 2018-08-15 DIAGNOSIS — N186 End stage renal disease: Secondary | ICD-10-CM | POA: Diagnosis not present

## 2018-08-15 DIAGNOSIS — Z992 Dependence on renal dialysis: Secondary | ICD-10-CM | POA: Diagnosis not present

## 2018-08-15 DIAGNOSIS — T8612 Kidney transplant failure: Secondary | ICD-10-CM | POA: Diagnosis not present

## 2018-08-17 DIAGNOSIS — N2581 Secondary hyperparathyroidism of renal origin: Secondary | ICD-10-CM | POA: Diagnosis not present

## 2018-08-17 DIAGNOSIS — D631 Anemia in chronic kidney disease: Secondary | ICD-10-CM | POA: Diagnosis not present

## 2018-08-17 DIAGNOSIS — D509 Iron deficiency anemia, unspecified: Secondary | ICD-10-CM | POA: Diagnosis not present

## 2018-08-17 DIAGNOSIS — N186 End stage renal disease: Secondary | ICD-10-CM | POA: Diagnosis not present

## 2018-08-19 DIAGNOSIS — D631 Anemia in chronic kidney disease: Secondary | ICD-10-CM | POA: Diagnosis not present

## 2018-08-19 DIAGNOSIS — N186 End stage renal disease: Secondary | ICD-10-CM | POA: Diagnosis not present

## 2018-08-19 DIAGNOSIS — D509 Iron deficiency anemia, unspecified: Secondary | ICD-10-CM | POA: Diagnosis not present

## 2018-08-19 DIAGNOSIS — N2581 Secondary hyperparathyroidism of renal origin: Secondary | ICD-10-CM | POA: Diagnosis not present

## 2018-08-21 DIAGNOSIS — D631 Anemia in chronic kidney disease: Secondary | ICD-10-CM | POA: Diagnosis not present

## 2018-08-21 DIAGNOSIS — N186 End stage renal disease: Secondary | ICD-10-CM | POA: Diagnosis not present

## 2018-08-21 DIAGNOSIS — N2581 Secondary hyperparathyroidism of renal origin: Secondary | ICD-10-CM | POA: Diagnosis not present

## 2018-08-21 DIAGNOSIS — D509 Iron deficiency anemia, unspecified: Secondary | ICD-10-CM | POA: Diagnosis not present

## 2018-08-24 ENCOUNTER — Encounter: Admit: 2018-08-24 | Discharge: 2018-08-24 | Payer: MEDICARE | Attending: Nephrology | Primary: Nephrology

## 2018-08-24 DIAGNOSIS — N2581 Secondary hyperparathyroidism of renal origin: Secondary | ICD-10-CM | POA: Diagnosis not present

## 2018-08-24 DIAGNOSIS — N186 End stage renal disease: Secondary | ICD-10-CM | POA: Diagnosis not present

## 2018-08-24 DIAGNOSIS — D631 Anemia in chronic kidney disease: Secondary | ICD-10-CM | POA: Diagnosis not present

## 2018-08-24 DIAGNOSIS — D509 Iron deficiency anemia, unspecified: Secondary | ICD-10-CM | POA: Diagnosis not present

## 2018-08-26 DIAGNOSIS — D509 Iron deficiency anemia, unspecified: Secondary | ICD-10-CM | POA: Diagnosis not present

## 2018-08-26 DIAGNOSIS — N186 End stage renal disease: Secondary | ICD-10-CM | POA: Diagnosis not present

## 2018-08-26 DIAGNOSIS — D631 Anemia in chronic kidney disease: Secondary | ICD-10-CM | POA: Diagnosis not present

## 2018-08-26 DIAGNOSIS — N2581 Secondary hyperparathyroidism of renal origin: Secondary | ICD-10-CM | POA: Diagnosis not present

## 2018-08-28 DIAGNOSIS — D509 Iron deficiency anemia, unspecified: Secondary | ICD-10-CM | POA: Diagnosis not present

## 2018-08-28 DIAGNOSIS — N186 End stage renal disease: Secondary | ICD-10-CM | POA: Diagnosis not present

## 2018-08-28 DIAGNOSIS — N2581 Secondary hyperparathyroidism of renal origin: Secondary | ICD-10-CM | POA: Diagnosis not present

## 2018-08-28 DIAGNOSIS — D631 Anemia in chronic kidney disease: Secondary | ICD-10-CM | POA: Diagnosis not present

## 2018-08-31 DIAGNOSIS — N2581 Secondary hyperparathyroidism of renal origin: Secondary | ICD-10-CM | POA: Diagnosis not present

## 2018-08-31 DIAGNOSIS — D509 Iron deficiency anemia, unspecified: Secondary | ICD-10-CM | POA: Diagnosis not present

## 2018-08-31 DIAGNOSIS — N186 End stage renal disease: Secondary | ICD-10-CM | POA: Diagnosis not present

## 2018-08-31 DIAGNOSIS — D631 Anemia in chronic kidney disease: Secondary | ICD-10-CM | POA: Diagnosis not present

## 2018-09-01 DIAGNOSIS — N186 End stage renal disease: Principal | ICD-10-CM

## 2018-09-01 DIAGNOSIS — Z01818 Encounter for other preprocedural examination: Principal | ICD-10-CM

## 2018-09-01 DIAGNOSIS — Z7682 Awaiting organ transplant status: Principal | ICD-10-CM

## 2018-09-02 DIAGNOSIS — N186 End stage renal disease: Secondary | ICD-10-CM | POA: Diagnosis not present

## 2018-09-02 DIAGNOSIS — D509 Iron deficiency anemia, unspecified: Secondary | ICD-10-CM | POA: Diagnosis not present

## 2018-09-02 DIAGNOSIS — N2581 Secondary hyperparathyroidism of renal origin: Secondary | ICD-10-CM | POA: Diagnosis not present

## 2018-09-02 DIAGNOSIS — D631 Anemia in chronic kidney disease: Secondary | ICD-10-CM | POA: Diagnosis not present

## 2018-09-04 DIAGNOSIS — D509 Iron deficiency anemia, unspecified: Secondary | ICD-10-CM | POA: Diagnosis not present

## 2018-09-04 DIAGNOSIS — N2581 Secondary hyperparathyroidism of renal origin: Secondary | ICD-10-CM | POA: Diagnosis not present

## 2018-09-04 DIAGNOSIS — N186 End stage renal disease: Secondary | ICD-10-CM | POA: Diagnosis not present

## 2018-09-04 DIAGNOSIS — D631 Anemia in chronic kidney disease: Secondary | ICD-10-CM | POA: Diagnosis not present

## 2018-09-07 DIAGNOSIS — D509 Iron deficiency anemia, unspecified: Secondary | ICD-10-CM | POA: Diagnosis not present

## 2018-09-07 DIAGNOSIS — N186 End stage renal disease: Secondary | ICD-10-CM | POA: Diagnosis not present

## 2018-09-07 DIAGNOSIS — D631 Anemia in chronic kidney disease: Secondary | ICD-10-CM | POA: Diagnosis not present

## 2018-09-07 DIAGNOSIS — N2581 Secondary hyperparathyroidism of renal origin: Secondary | ICD-10-CM | POA: Diagnosis not present

## 2018-09-09 DIAGNOSIS — D509 Iron deficiency anemia, unspecified: Secondary | ICD-10-CM | POA: Diagnosis not present

## 2018-09-09 DIAGNOSIS — D631 Anemia in chronic kidney disease: Secondary | ICD-10-CM | POA: Diagnosis not present

## 2018-09-09 DIAGNOSIS — N186 End stage renal disease: Secondary | ICD-10-CM | POA: Diagnosis not present

## 2018-09-09 DIAGNOSIS — N2581 Secondary hyperparathyroidism of renal origin: Secondary | ICD-10-CM | POA: Diagnosis not present

## 2018-09-11 DIAGNOSIS — N2581 Secondary hyperparathyroidism of renal origin: Secondary | ICD-10-CM | POA: Diagnosis not present

## 2018-09-11 DIAGNOSIS — N186 End stage renal disease: Secondary | ICD-10-CM | POA: Diagnosis not present

## 2018-09-11 DIAGNOSIS — D509 Iron deficiency anemia, unspecified: Secondary | ICD-10-CM | POA: Diagnosis not present

## 2018-09-11 DIAGNOSIS — D631 Anemia in chronic kidney disease: Secondary | ICD-10-CM | POA: Diagnosis not present

## 2018-09-14 DIAGNOSIS — N2581 Secondary hyperparathyroidism of renal origin: Secondary | ICD-10-CM | POA: Diagnosis not present

## 2018-09-14 DIAGNOSIS — D631 Anemia in chronic kidney disease: Secondary | ICD-10-CM | POA: Diagnosis not present

## 2018-09-14 DIAGNOSIS — D509 Iron deficiency anemia, unspecified: Secondary | ICD-10-CM | POA: Diagnosis not present

## 2018-09-14 DIAGNOSIS — N186 End stage renal disease: Secondary | ICD-10-CM | POA: Diagnosis not present

## 2018-09-15 DIAGNOSIS — T8612 Kidney transplant failure: Secondary | ICD-10-CM | POA: Diagnosis not present

## 2018-09-16 DIAGNOSIS — N186 End stage renal disease: Secondary | ICD-10-CM | POA: Diagnosis not present

## 2018-09-16 DIAGNOSIS — D509 Iron deficiency anemia, unspecified: Secondary | ICD-10-CM | POA: Diagnosis not present

## 2018-09-16 DIAGNOSIS — D631 Anemia in chronic kidney disease: Secondary | ICD-10-CM | POA: Diagnosis not present

## 2018-09-16 DIAGNOSIS — N2581 Secondary hyperparathyroidism of renal origin: Secondary | ICD-10-CM | POA: Diagnosis not present

## 2018-09-18 DIAGNOSIS — D631 Anemia in chronic kidney disease: Secondary | ICD-10-CM | POA: Diagnosis not present

## 2018-09-18 DIAGNOSIS — D509 Iron deficiency anemia, unspecified: Secondary | ICD-10-CM | POA: Diagnosis not present

## 2018-09-18 DIAGNOSIS — N186 End stage renal disease: Secondary | ICD-10-CM | POA: Diagnosis not present

## 2018-09-18 DIAGNOSIS — N2581 Secondary hyperparathyroidism of renal origin: Secondary | ICD-10-CM | POA: Diagnosis not present

## 2018-09-21 ENCOUNTER — Encounter: Admit: 2018-09-21 | Discharge: 2018-09-21 | Payer: MEDICARE | Attending: Nephrology | Primary: Nephrology

## 2018-09-21 DIAGNOSIS — D631 Anemia in chronic kidney disease: Secondary | ICD-10-CM | POA: Diagnosis not present

## 2018-09-21 DIAGNOSIS — D509 Iron deficiency anemia, unspecified: Secondary | ICD-10-CM | POA: Diagnosis not present

## 2018-09-21 DIAGNOSIS — N2581 Secondary hyperparathyroidism of renal origin: Secondary | ICD-10-CM | POA: Diagnosis not present

## 2018-09-21 DIAGNOSIS — N186 End stage renal disease: Secondary | ICD-10-CM | POA: Diagnosis not present

## 2018-09-23 DIAGNOSIS — N2581 Secondary hyperparathyroidism of renal origin: Secondary | ICD-10-CM | POA: Diagnosis not present

## 2018-09-23 DIAGNOSIS — D631 Anemia in chronic kidney disease: Secondary | ICD-10-CM | POA: Diagnosis not present

## 2018-09-23 DIAGNOSIS — D509 Iron deficiency anemia, unspecified: Secondary | ICD-10-CM | POA: Diagnosis not present

## 2018-09-23 DIAGNOSIS — N186 End stage renal disease: Secondary | ICD-10-CM | POA: Diagnosis not present

## 2018-09-25 DIAGNOSIS — N186 End stage renal disease: Secondary | ICD-10-CM | POA: Diagnosis not present

## 2018-09-25 DIAGNOSIS — N2581 Secondary hyperparathyroidism of renal origin: Secondary | ICD-10-CM | POA: Diagnosis not present

## 2018-09-25 DIAGNOSIS — D631 Anemia in chronic kidney disease: Secondary | ICD-10-CM | POA: Diagnosis not present

## 2018-09-25 DIAGNOSIS — D509 Iron deficiency anemia, unspecified: Secondary | ICD-10-CM | POA: Diagnosis not present

## 2018-09-27 DIAGNOSIS — Z7682 Awaiting organ transplant status: Principal | ICD-10-CM

## 2018-09-27 DIAGNOSIS — N186 End stage renal disease: Secondary | ICD-10-CM

## 2018-09-27 DIAGNOSIS — Z01818 Encounter for other preprocedural examination: Secondary | ICD-10-CM

## 2018-09-28 DIAGNOSIS — D509 Iron deficiency anemia, unspecified: Secondary | ICD-10-CM | POA: Diagnosis not present

## 2018-09-28 DIAGNOSIS — D631 Anemia in chronic kidney disease: Secondary | ICD-10-CM | POA: Diagnosis not present

## 2018-09-28 DIAGNOSIS — N2581 Secondary hyperparathyroidism of renal origin: Secondary | ICD-10-CM | POA: Diagnosis not present

## 2018-09-28 DIAGNOSIS — N186 End stage renal disease: Secondary | ICD-10-CM | POA: Diagnosis not present

## 2018-09-30 DIAGNOSIS — N2581 Secondary hyperparathyroidism of renal origin: Secondary | ICD-10-CM | POA: Diagnosis not present

## 2018-09-30 DIAGNOSIS — N186 End stage renal disease: Secondary | ICD-10-CM | POA: Diagnosis not present

## 2018-09-30 DIAGNOSIS — D631 Anemia in chronic kidney disease: Secondary | ICD-10-CM | POA: Diagnosis not present

## 2018-09-30 DIAGNOSIS — D509 Iron deficiency anemia, unspecified: Secondary | ICD-10-CM | POA: Diagnosis not present

## 2018-10-02 DIAGNOSIS — D631 Anemia in chronic kidney disease: Secondary | ICD-10-CM | POA: Diagnosis not present

## 2018-10-02 DIAGNOSIS — N2581 Secondary hyperparathyroidism of renal origin: Secondary | ICD-10-CM | POA: Diagnosis not present

## 2018-10-02 DIAGNOSIS — D509 Iron deficiency anemia, unspecified: Secondary | ICD-10-CM | POA: Diagnosis not present

## 2018-10-02 DIAGNOSIS — N186 End stage renal disease: Secondary | ICD-10-CM | POA: Diagnosis not present

## 2018-10-05 DIAGNOSIS — D509 Iron deficiency anemia, unspecified: Secondary | ICD-10-CM | POA: Diagnosis not present

## 2018-10-05 DIAGNOSIS — N2581 Secondary hyperparathyroidism of renal origin: Secondary | ICD-10-CM | POA: Diagnosis not present

## 2018-10-05 DIAGNOSIS — D631 Anemia in chronic kidney disease: Secondary | ICD-10-CM | POA: Diagnosis not present

## 2018-10-05 DIAGNOSIS — N186 End stage renal disease: Secondary | ICD-10-CM | POA: Diagnosis not present

## 2018-10-07 DIAGNOSIS — N186 End stage renal disease: Secondary | ICD-10-CM | POA: Diagnosis not present

## 2018-10-07 DIAGNOSIS — D631 Anemia in chronic kidney disease: Secondary | ICD-10-CM | POA: Diagnosis not present

## 2018-10-07 DIAGNOSIS — N2581 Secondary hyperparathyroidism of renal origin: Secondary | ICD-10-CM | POA: Diagnosis not present

## 2018-10-07 DIAGNOSIS — D509 Iron deficiency anemia, unspecified: Secondary | ICD-10-CM | POA: Diagnosis not present

## 2018-10-09 DIAGNOSIS — N186 End stage renal disease: Secondary | ICD-10-CM | POA: Diagnosis not present

## 2018-10-09 DIAGNOSIS — D631 Anemia in chronic kidney disease: Secondary | ICD-10-CM | POA: Diagnosis not present

## 2018-10-09 DIAGNOSIS — D509 Iron deficiency anemia, unspecified: Secondary | ICD-10-CM | POA: Diagnosis not present

## 2018-10-09 DIAGNOSIS — N2581 Secondary hyperparathyroidism of renal origin: Secondary | ICD-10-CM | POA: Diagnosis not present

## 2018-10-12 DIAGNOSIS — D631 Anemia in chronic kidney disease: Secondary | ICD-10-CM | POA: Diagnosis not present

## 2018-10-12 DIAGNOSIS — N2581 Secondary hyperparathyroidism of renal origin: Secondary | ICD-10-CM | POA: Diagnosis not present

## 2018-10-12 DIAGNOSIS — N186 End stage renal disease: Secondary | ICD-10-CM | POA: Diagnosis not present

## 2018-10-12 DIAGNOSIS — D509 Iron deficiency anemia, unspecified: Secondary | ICD-10-CM | POA: Diagnosis not present

## 2018-10-14 DIAGNOSIS — N186 End stage renal disease: Secondary | ICD-10-CM | POA: Diagnosis not present

## 2018-10-14 DIAGNOSIS — D631 Anemia in chronic kidney disease: Secondary | ICD-10-CM | POA: Diagnosis not present

## 2018-10-14 DIAGNOSIS — D509 Iron deficiency anemia, unspecified: Secondary | ICD-10-CM | POA: Diagnosis not present

## 2018-10-14 DIAGNOSIS — N2581 Secondary hyperparathyroidism of renal origin: Secondary | ICD-10-CM | POA: Diagnosis not present

## 2018-10-15 DIAGNOSIS — N186 End stage renal disease: Secondary | ICD-10-CM | POA: Diagnosis not present

## 2018-10-15 DIAGNOSIS — Z992 Dependence on renal dialysis: Secondary | ICD-10-CM | POA: Diagnosis not present

## 2018-10-15 DIAGNOSIS — T8612 Kidney transplant failure: Secondary | ICD-10-CM | POA: Diagnosis not present

## 2018-10-16 DIAGNOSIS — N186 End stage renal disease: Secondary | ICD-10-CM | POA: Diagnosis not present

## 2018-10-16 DIAGNOSIS — N2581 Secondary hyperparathyroidism of renal origin: Secondary | ICD-10-CM | POA: Diagnosis not present

## 2018-10-16 DIAGNOSIS — D509 Iron deficiency anemia, unspecified: Secondary | ICD-10-CM | POA: Diagnosis not present

## 2018-10-16 DIAGNOSIS — D631 Anemia in chronic kidney disease: Secondary | ICD-10-CM | POA: Diagnosis not present

## 2018-10-19 DIAGNOSIS — D631 Anemia in chronic kidney disease: Secondary | ICD-10-CM | POA: Diagnosis not present

## 2018-10-19 DIAGNOSIS — D509 Iron deficiency anemia, unspecified: Secondary | ICD-10-CM | POA: Diagnosis not present

## 2018-10-19 DIAGNOSIS — N2581 Secondary hyperparathyroidism of renal origin: Secondary | ICD-10-CM | POA: Diagnosis not present

## 2018-10-19 DIAGNOSIS — N186 End stage renal disease: Secondary | ICD-10-CM | POA: Diagnosis not present

## 2018-10-21 DIAGNOSIS — N2581 Secondary hyperparathyroidism of renal origin: Secondary | ICD-10-CM | POA: Diagnosis not present

## 2018-10-21 DIAGNOSIS — N186 End stage renal disease: Secondary | ICD-10-CM | POA: Diagnosis not present

## 2018-10-21 DIAGNOSIS — D631 Anemia in chronic kidney disease: Secondary | ICD-10-CM | POA: Diagnosis not present

## 2018-10-21 DIAGNOSIS — D509 Iron deficiency anemia, unspecified: Secondary | ICD-10-CM | POA: Diagnosis not present

## 2018-10-23 DIAGNOSIS — D509 Iron deficiency anemia, unspecified: Secondary | ICD-10-CM | POA: Diagnosis not present

## 2018-10-23 DIAGNOSIS — N186 End stage renal disease: Secondary | ICD-10-CM | POA: Diagnosis not present

## 2018-10-23 DIAGNOSIS — N2581 Secondary hyperparathyroidism of renal origin: Secondary | ICD-10-CM | POA: Diagnosis not present

## 2018-10-23 DIAGNOSIS — D631 Anemia in chronic kidney disease: Secondary | ICD-10-CM | POA: Diagnosis not present

## 2018-10-26 ENCOUNTER — Encounter: Admit: 2018-10-26 | Discharge: 2018-10-26 | Payer: MEDICARE | Attending: Nephrology | Primary: Nephrology

## 2018-10-26 DIAGNOSIS — N186 End stage renal disease: Secondary | ICD-10-CM | POA: Diagnosis not present

## 2018-10-26 DIAGNOSIS — D509 Iron deficiency anemia, unspecified: Secondary | ICD-10-CM | POA: Diagnosis not present

## 2018-10-26 DIAGNOSIS — D631 Anemia in chronic kidney disease: Secondary | ICD-10-CM | POA: Diagnosis not present

## 2018-10-26 DIAGNOSIS — N2581 Secondary hyperparathyroidism of renal origin: Secondary | ICD-10-CM | POA: Diagnosis not present

## 2018-10-28 DIAGNOSIS — D631 Anemia in chronic kidney disease: Secondary | ICD-10-CM | POA: Diagnosis not present

## 2018-10-28 DIAGNOSIS — N2581 Secondary hyperparathyroidism of renal origin: Secondary | ICD-10-CM | POA: Diagnosis not present

## 2018-10-28 DIAGNOSIS — D509 Iron deficiency anemia, unspecified: Secondary | ICD-10-CM | POA: Diagnosis not present

## 2018-10-28 DIAGNOSIS — N186 End stage renal disease: Secondary | ICD-10-CM | POA: Diagnosis not present

## 2018-10-30 DIAGNOSIS — D631 Anemia in chronic kidney disease: Secondary | ICD-10-CM | POA: Diagnosis not present

## 2018-10-30 DIAGNOSIS — N2581 Secondary hyperparathyroidism of renal origin: Secondary | ICD-10-CM | POA: Diagnosis not present

## 2018-10-30 DIAGNOSIS — D509 Iron deficiency anemia, unspecified: Secondary | ICD-10-CM | POA: Diagnosis not present

## 2018-10-30 DIAGNOSIS — N186 End stage renal disease: Secondary | ICD-10-CM | POA: Diagnosis not present

## 2018-11-01 DIAGNOSIS — Z01818 Encounter for other preprocedural examination: Secondary | ICD-10-CM

## 2018-11-01 DIAGNOSIS — Z7682 Awaiting organ transplant status: Principal | ICD-10-CM

## 2018-11-01 DIAGNOSIS — N186 End stage renal disease: Secondary | ICD-10-CM

## 2018-11-02 DIAGNOSIS — N2581 Secondary hyperparathyroidism of renal origin: Secondary | ICD-10-CM | POA: Diagnosis not present

## 2018-11-02 DIAGNOSIS — D509 Iron deficiency anemia, unspecified: Secondary | ICD-10-CM | POA: Diagnosis not present

## 2018-11-02 DIAGNOSIS — N186 End stage renal disease: Secondary | ICD-10-CM | POA: Diagnosis not present

## 2018-11-02 DIAGNOSIS — D631 Anemia in chronic kidney disease: Secondary | ICD-10-CM | POA: Diagnosis not present

## 2018-11-04 DIAGNOSIS — N2581 Secondary hyperparathyroidism of renal origin: Secondary | ICD-10-CM | POA: Diagnosis not present

## 2018-11-04 DIAGNOSIS — D509 Iron deficiency anemia, unspecified: Secondary | ICD-10-CM | POA: Diagnosis not present

## 2018-11-04 DIAGNOSIS — N186 End stage renal disease: Secondary | ICD-10-CM | POA: Diagnosis not present

## 2018-11-04 DIAGNOSIS — D631 Anemia in chronic kidney disease: Secondary | ICD-10-CM | POA: Diagnosis not present

## 2018-11-06 DIAGNOSIS — D631 Anemia in chronic kidney disease: Secondary | ICD-10-CM | POA: Diagnosis not present

## 2018-11-06 DIAGNOSIS — N2581 Secondary hyperparathyroidism of renal origin: Secondary | ICD-10-CM | POA: Diagnosis not present

## 2018-11-06 DIAGNOSIS — D509 Iron deficiency anemia, unspecified: Secondary | ICD-10-CM | POA: Diagnosis not present

## 2018-11-06 DIAGNOSIS — N186 End stage renal disease: Secondary | ICD-10-CM | POA: Diagnosis not present

## 2018-11-09 DIAGNOSIS — D509 Iron deficiency anemia, unspecified: Secondary | ICD-10-CM | POA: Diagnosis not present

## 2018-11-09 DIAGNOSIS — D631 Anemia in chronic kidney disease: Secondary | ICD-10-CM | POA: Diagnosis not present

## 2018-11-09 DIAGNOSIS — N2581 Secondary hyperparathyroidism of renal origin: Secondary | ICD-10-CM | POA: Diagnosis not present

## 2018-11-09 DIAGNOSIS — N186 End stage renal disease: Secondary | ICD-10-CM | POA: Diagnosis not present

## 2018-11-11 DIAGNOSIS — D631 Anemia in chronic kidney disease: Secondary | ICD-10-CM | POA: Diagnosis not present

## 2018-11-11 DIAGNOSIS — N186 End stage renal disease: Secondary | ICD-10-CM | POA: Diagnosis not present

## 2018-11-11 DIAGNOSIS — D509 Iron deficiency anemia, unspecified: Secondary | ICD-10-CM | POA: Diagnosis not present

## 2018-11-11 DIAGNOSIS — N2581 Secondary hyperparathyroidism of renal origin: Secondary | ICD-10-CM | POA: Diagnosis not present

## 2018-11-13 DIAGNOSIS — D509 Iron deficiency anemia, unspecified: Secondary | ICD-10-CM | POA: Diagnosis not present

## 2018-11-13 DIAGNOSIS — N186 End stage renal disease: Secondary | ICD-10-CM | POA: Diagnosis not present

## 2018-11-13 DIAGNOSIS — N2581 Secondary hyperparathyroidism of renal origin: Secondary | ICD-10-CM | POA: Diagnosis not present

## 2018-11-13 DIAGNOSIS — D631 Anemia in chronic kidney disease: Secondary | ICD-10-CM | POA: Diagnosis not present

## 2018-11-15 DIAGNOSIS — T8612 Kidney transplant failure: Secondary | ICD-10-CM | POA: Diagnosis not present

## 2018-11-15 DIAGNOSIS — Z992 Dependence on renal dialysis: Secondary | ICD-10-CM | POA: Diagnosis not present

## 2018-11-15 DIAGNOSIS — N186 End stage renal disease: Secondary | ICD-10-CM | POA: Diagnosis not present

## 2018-11-16 DIAGNOSIS — N2581 Secondary hyperparathyroidism of renal origin: Secondary | ICD-10-CM | POA: Diagnosis not present

## 2018-11-16 DIAGNOSIS — D631 Anemia in chronic kidney disease: Secondary | ICD-10-CM | POA: Diagnosis not present

## 2018-11-16 DIAGNOSIS — D509 Iron deficiency anemia, unspecified: Secondary | ICD-10-CM | POA: Diagnosis not present

## 2018-11-16 DIAGNOSIS — N186 End stage renal disease: Secondary | ICD-10-CM | POA: Diagnosis not present

## 2018-11-18 DIAGNOSIS — N186 End stage renal disease: Secondary | ICD-10-CM | POA: Diagnosis not present

## 2018-11-18 DIAGNOSIS — N2581 Secondary hyperparathyroidism of renal origin: Secondary | ICD-10-CM | POA: Diagnosis not present

## 2018-11-18 DIAGNOSIS — D631 Anemia in chronic kidney disease: Secondary | ICD-10-CM | POA: Diagnosis not present

## 2018-11-18 DIAGNOSIS — D509 Iron deficiency anemia, unspecified: Secondary | ICD-10-CM | POA: Diagnosis not present

## 2018-11-20 DIAGNOSIS — N2581 Secondary hyperparathyroidism of renal origin: Secondary | ICD-10-CM | POA: Diagnosis not present

## 2018-11-20 DIAGNOSIS — D509 Iron deficiency anemia, unspecified: Secondary | ICD-10-CM | POA: Diagnosis not present

## 2018-11-20 DIAGNOSIS — N186 End stage renal disease: Secondary | ICD-10-CM | POA: Diagnosis not present

## 2018-11-20 DIAGNOSIS — D631 Anemia in chronic kidney disease: Secondary | ICD-10-CM | POA: Diagnosis not present

## 2018-11-23 ENCOUNTER — Encounter: Admit: 2018-11-23 | Discharge: 2018-11-23 | Payer: MEDICARE | Attending: Nephrology | Primary: Nephrology

## 2018-11-23 DIAGNOSIS — D631 Anemia in chronic kidney disease: Secondary | ICD-10-CM | POA: Diagnosis not present

## 2018-11-23 DIAGNOSIS — N186 End stage renal disease: Secondary | ICD-10-CM | POA: Diagnosis not present

## 2018-11-23 DIAGNOSIS — D509 Iron deficiency anemia, unspecified: Secondary | ICD-10-CM | POA: Diagnosis not present

## 2018-11-23 DIAGNOSIS — N2581 Secondary hyperparathyroidism of renal origin: Secondary | ICD-10-CM | POA: Diagnosis not present

## 2018-11-25 DIAGNOSIS — N186 End stage renal disease: Secondary | ICD-10-CM | POA: Diagnosis not present

## 2018-11-25 DIAGNOSIS — D631 Anemia in chronic kidney disease: Secondary | ICD-10-CM | POA: Diagnosis not present

## 2018-11-25 DIAGNOSIS — D509 Iron deficiency anemia, unspecified: Secondary | ICD-10-CM | POA: Diagnosis not present

## 2018-11-25 DIAGNOSIS — N2581 Secondary hyperparathyroidism of renal origin: Secondary | ICD-10-CM | POA: Diagnosis not present

## 2018-11-27 DIAGNOSIS — N186 End stage renal disease: Secondary | ICD-10-CM | POA: Diagnosis not present

## 2018-11-27 DIAGNOSIS — D509 Iron deficiency anemia, unspecified: Secondary | ICD-10-CM | POA: Diagnosis not present

## 2018-11-27 DIAGNOSIS — D631 Anemia in chronic kidney disease: Secondary | ICD-10-CM | POA: Diagnosis not present

## 2018-11-27 DIAGNOSIS — N2581 Secondary hyperparathyroidism of renal origin: Secondary | ICD-10-CM | POA: Diagnosis not present

## 2018-11-29 DIAGNOSIS — N186 End stage renal disease: Secondary | ICD-10-CM

## 2018-11-29 DIAGNOSIS — Z01818 Encounter for other preprocedural examination: Secondary | ICD-10-CM

## 2018-11-29 DIAGNOSIS — Z7682 Awaiting organ transplant status: Principal | ICD-10-CM

## 2018-11-30 DIAGNOSIS — N186 End stage renal disease: Secondary | ICD-10-CM | POA: Diagnosis not present

## 2018-11-30 DIAGNOSIS — N2581 Secondary hyperparathyroidism of renal origin: Secondary | ICD-10-CM | POA: Diagnosis not present

## 2018-11-30 DIAGNOSIS — D509 Iron deficiency anemia, unspecified: Secondary | ICD-10-CM | POA: Diagnosis not present

## 2018-11-30 DIAGNOSIS — D631 Anemia in chronic kidney disease: Secondary | ICD-10-CM | POA: Diagnosis not present

## 2018-12-02 DIAGNOSIS — N186 End stage renal disease: Secondary | ICD-10-CM | POA: Diagnosis not present

## 2018-12-02 DIAGNOSIS — D509 Iron deficiency anemia, unspecified: Secondary | ICD-10-CM | POA: Diagnosis not present

## 2018-12-02 DIAGNOSIS — N2581 Secondary hyperparathyroidism of renal origin: Secondary | ICD-10-CM | POA: Diagnosis not present

## 2018-12-02 DIAGNOSIS — D631 Anemia in chronic kidney disease: Secondary | ICD-10-CM | POA: Diagnosis not present

## 2018-12-04 DIAGNOSIS — D631 Anemia in chronic kidney disease: Secondary | ICD-10-CM | POA: Diagnosis not present

## 2018-12-04 DIAGNOSIS — N186 End stage renal disease: Secondary | ICD-10-CM | POA: Diagnosis not present

## 2018-12-04 DIAGNOSIS — D509 Iron deficiency anemia, unspecified: Secondary | ICD-10-CM | POA: Diagnosis not present

## 2018-12-04 DIAGNOSIS — N2581 Secondary hyperparathyroidism of renal origin: Secondary | ICD-10-CM | POA: Diagnosis not present

## 2018-12-07 DIAGNOSIS — N2581 Secondary hyperparathyroidism of renal origin: Secondary | ICD-10-CM | POA: Diagnosis not present

## 2018-12-07 DIAGNOSIS — D509 Iron deficiency anemia, unspecified: Secondary | ICD-10-CM | POA: Diagnosis not present

## 2018-12-07 DIAGNOSIS — D631 Anemia in chronic kidney disease: Secondary | ICD-10-CM | POA: Diagnosis not present

## 2018-12-07 DIAGNOSIS — N186 End stage renal disease: Secondary | ICD-10-CM | POA: Diagnosis not present

## 2018-12-09 DIAGNOSIS — N186 End stage renal disease: Secondary | ICD-10-CM | POA: Diagnosis not present

## 2018-12-09 DIAGNOSIS — N2581 Secondary hyperparathyroidism of renal origin: Secondary | ICD-10-CM | POA: Diagnosis not present

## 2018-12-09 DIAGNOSIS — D631 Anemia in chronic kidney disease: Secondary | ICD-10-CM | POA: Diagnosis not present

## 2018-12-09 DIAGNOSIS — D509 Iron deficiency anemia, unspecified: Secondary | ICD-10-CM | POA: Diagnosis not present

## 2018-12-11 DIAGNOSIS — D631 Anemia in chronic kidney disease: Secondary | ICD-10-CM | POA: Diagnosis not present

## 2018-12-11 DIAGNOSIS — N186 End stage renal disease: Secondary | ICD-10-CM | POA: Diagnosis not present

## 2018-12-11 DIAGNOSIS — D509 Iron deficiency anemia, unspecified: Secondary | ICD-10-CM | POA: Diagnosis not present

## 2018-12-11 DIAGNOSIS — N2581 Secondary hyperparathyroidism of renal origin: Secondary | ICD-10-CM | POA: Diagnosis not present

## 2018-12-14 DIAGNOSIS — N186 End stage renal disease: Secondary | ICD-10-CM | POA: Diagnosis not present

## 2018-12-14 DIAGNOSIS — D509 Iron deficiency anemia, unspecified: Secondary | ICD-10-CM | POA: Diagnosis not present

## 2018-12-14 DIAGNOSIS — D631 Anemia in chronic kidney disease: Secondary | ICD-10-CM | POA: Diagnosis not present

## 2018-12-14 DIAGNOSIS — N2581 Secondary hyperparathyroidism of renal origin: Secondary | ICD-10-CM | POA: Diagnosis not present

## 2018-12-15 DIAGNOSIS — N186 End stage renal disease: Secondary | ICD-10-CM | POA: Diagnosis not present

## 2018-12-15 DIAGNOSIS — Z992 Dependence on renal dialysis: Secondary | ICD-10-CM | POA: Diagnosis not present

## 2018-12-15 DIAGNOSIS — T8612 Kidney transplant failure: Secondary | ICD-10-CM | POA: Diagnosis not present

## 2018-12-16 DIAGNOSIS — N2581 Secondary hyperparathyroidism of renal origin: Secondary | ICD-10-CM | POA: Diagnosis not present

## 2018-12-16 DIAGNOSIS — N186 End stage renal disease: Secondary | ICD-10-CM | POA: Diagnosis not present

## 2018-12-16 DIAGNOSIS — D509 Iron deficiency anemia, unspecified: Secondary | ICD-10-CM | POA: Diagnosis not present

## 2018-12-16 DIAGNOSIS — D631 Anemia in chronic kidney disease: Secondary | ICD-10-CM | POA: Diagnosis not present

## 2018-12-18 DIAGNOSIS — D509 Iron deficiency anemia, unspecified: Secondary | ICD-10-CM | POA: Diagnosis not present

## 2018-12-18 DIAGNOSIS — N2581 Secondary hyperparathyroidism of renal origin: Secondary | ICD-10-CM | POA: Diagnosis not present

## 2018-12-18 DIAGNOSIS — N186 End stage renal disease: Secondary | ICD-10-CM | POA: Diagnosis not present

## 2018-12-18 DIAGNOSIS — D631 Anemia in chronic kidney disease: Secondary | ICD-10-CM | POA: Diagnosis not present

## 2018-12-21 DIAGNOSIS — N2581 Secondary hyperparathyroidism of renal origin: Secondary | ICD-10-CM | POA: Diagnosis not present

## 2018-12-21 DIAGNOSIS — D509 Iron deficiency anemia, unspecified: Secondary | ICD-10-CM | POA: Diagnosis not present

## 2018-12-21 DIAGNOSIS — N186 End stage renal disease: Secondary | ICD-10-CM | POA: Diagnosis not present

## 2018-12-21 DIAGNOSIS — D631 Anemia in chronic kidney disease: Secondary | ICD-10-CM | POA: Diagnosis not present

## 2018-12-23 DIAGNOSIS — N186 End stage renal disease: Secondary | ICD-10-CM | POA: Diagnosis not present

## 2018-12-23 DIAGNOSIS — D631 Anemia in chronic kidney disease: Secondary | ICD-10-CM | POA: Diagnosis not present

## 2018-12-23 DIAGNOSIS — N2581 Secondary hyperparathyroidism of renal origin: Secondary | ICD-10-CM | POA: Diagnosis not present

## 2018-12-23 DIAGNOSIS — D509 Iron deficiency anemia, unspecified: Secondary | ICD-10-CM | POA: Diagnosis not present

## 2018-12-25 DIAGNOSIS — N186 End stage renal disease: Secondary | ICD-10-CM | POA: Diagnosis not present

## 2018-12-25 DIAGNOSIS — N2581 Secondary hyperparathyroidism of renal origin: Secondary | ICD-10-CM | POA: Diagnosis not present

## 2018-12-25 DIAGNOSIS — D509 Iron deficiency anemia, unspecified: Secondary | ICD-10-CM | POA: Diagnosis not present

## 2018-12-25 DIAGNOSIS — D631 Anemia in chronic kidney disease: Secondary | ICD-10-CM | POA: Diagnosis not present

## 2018-12-27 IMAGING — DX DG WRIST COMPLETE 3+V*L*
4 series · 4 of 4 positions shown · non-contrast
Comparison: None.

CLINICAL DATA: Left wrist pain.

EXAM:
LEFT WRIST - COMPLETE 3+ VIEW

[wrist pa]
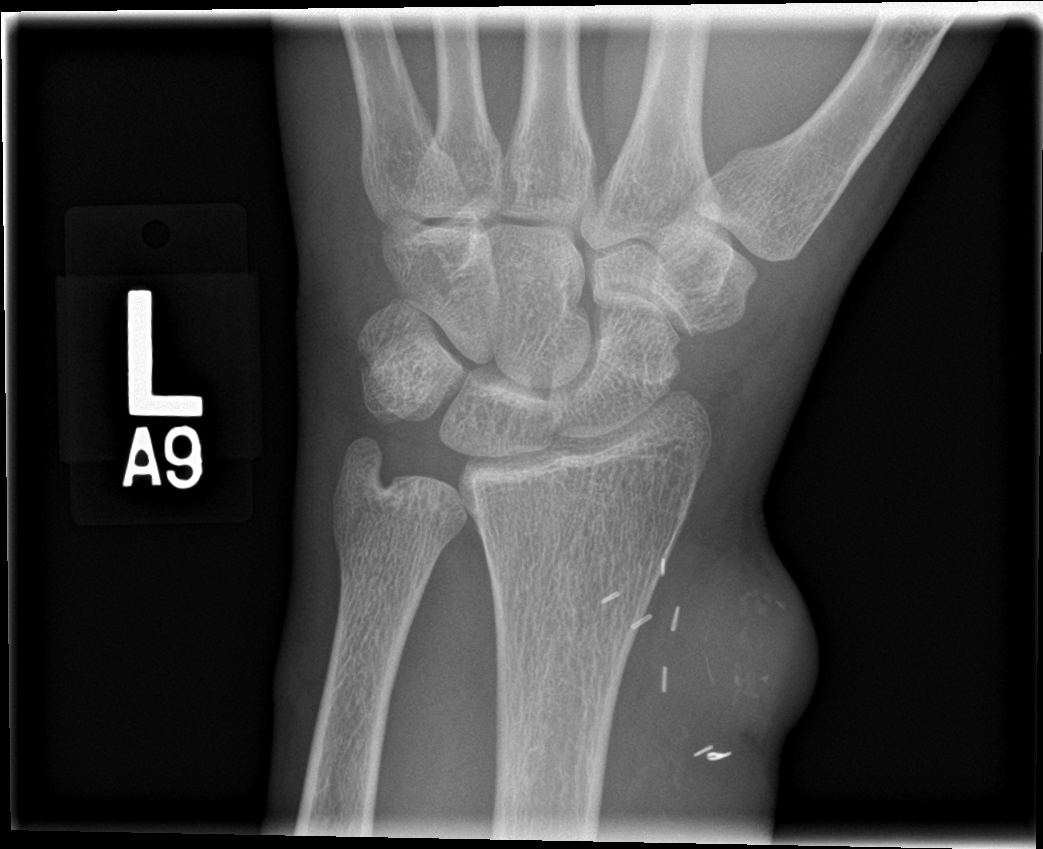

[wrist obl]
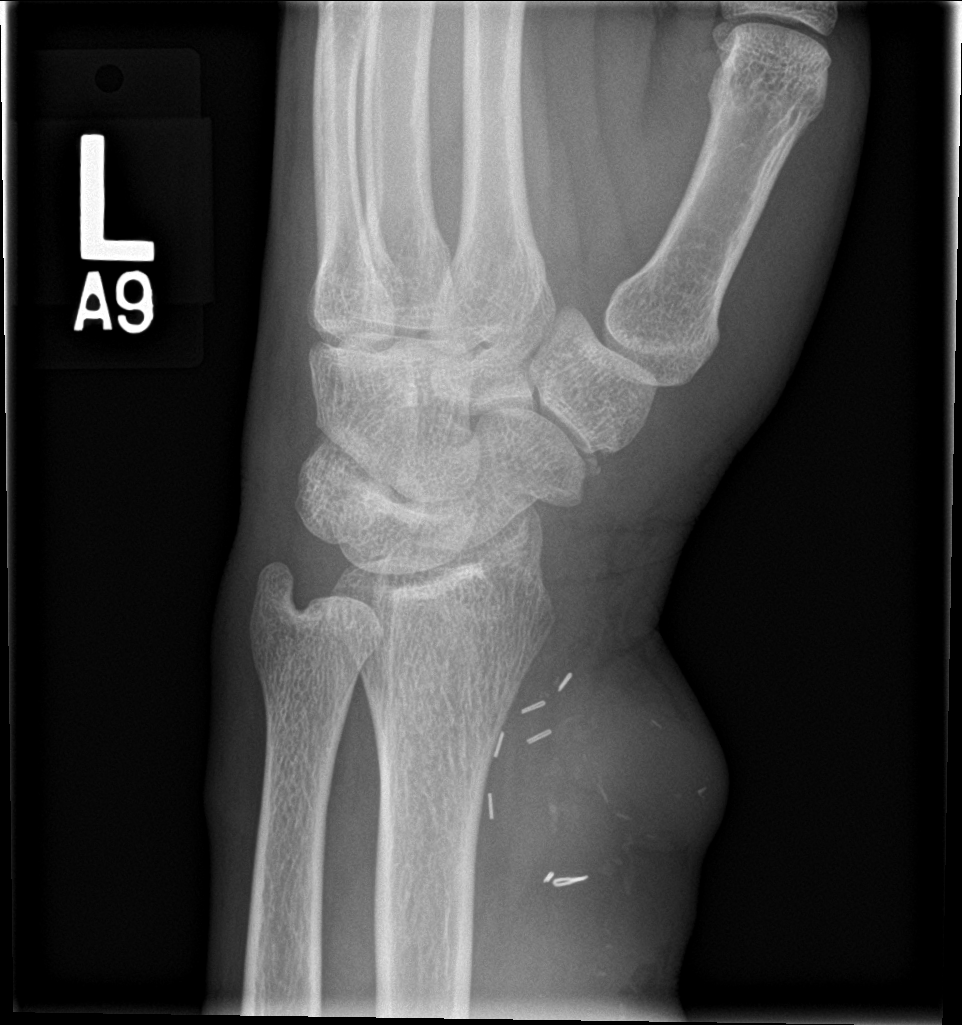

[wrist lat]
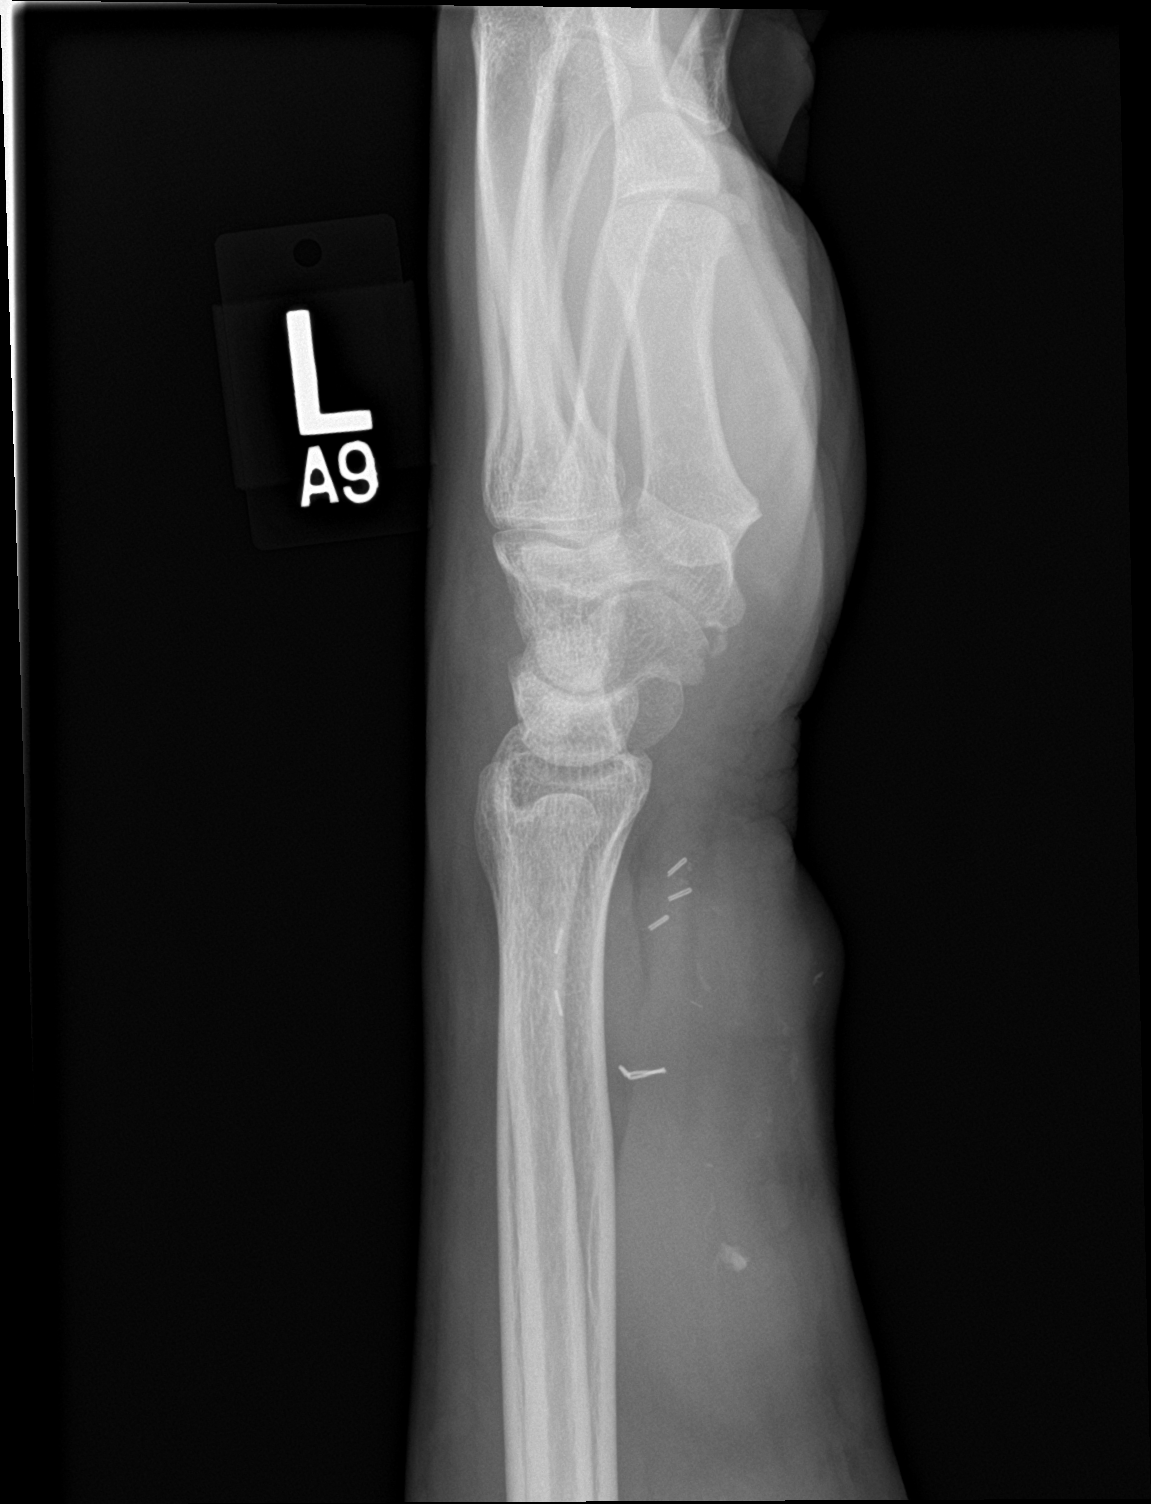

[wrist navicular]
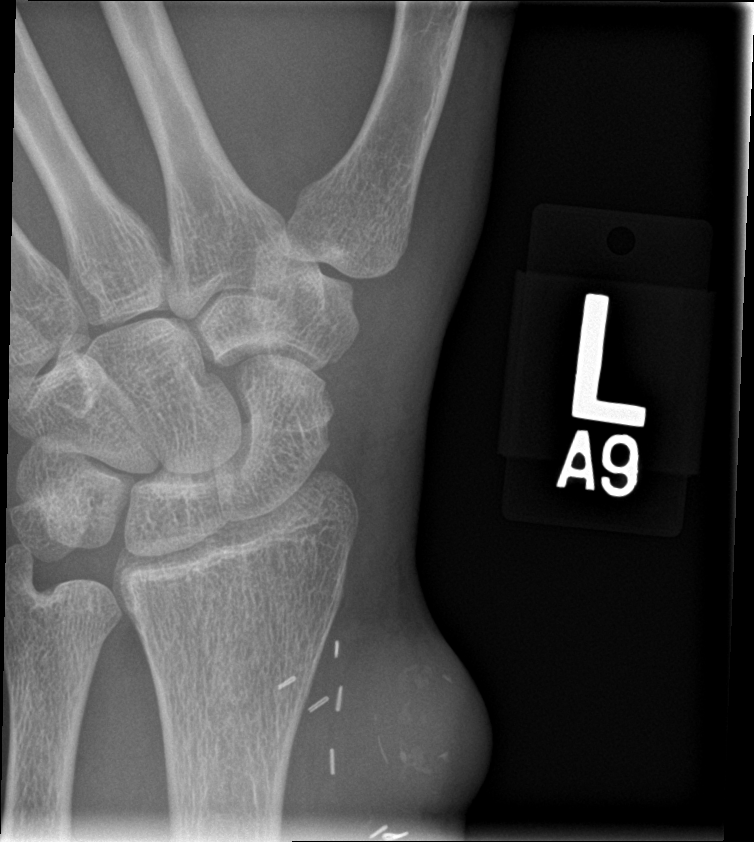

[4 of 4 positions shown; findings below may reference images not displayed]

FINDINGS: Forearm fistula is again identified. No acute fracture or
subluxation identified. No radio-opaque foreign bodies identified.
IMPRESSION: 1. No acute bone abnormality.

## 2018-12-28 ENCOUNTER — Encounter: Admit: 2018-12-28 | Discharge: 2018-12-28 | Payer: MEDICARE | Attending: Nephrology | Primary: Nephrology

## 2018-12-28 DIAGNOSIS — N2581 Secondary hyperparathyroidism of renal origin: Secondary | ICD-10-CM | POA: Diagnosis not present

## 2018-12-28 DIAGNOSIS — N186 End stage renal disease: Secondary | ICD-10-CM | POA: Diagnosis not present

## 2018-12-28 DIAGNOSIS — D631 Anemia in chronic kidney disease: Secondary | ICD-10-CM | POA: Diagnosis not present

## 2018-12-28 DIAGNOSIS — D509 Iron deficiency anemia, unspecified: Secondary | ICD-10-CM | POA: Diagnosis not present

## 2018-12-30 DIAGNOSIS — N2581 Secondary hyperparathyroidism of renal origin: Secondary | ICD-10-CM | POA: Diagnosis not present

## 2018-12-30 DIAGNOSIS — D631 Anemia in chronic kidney disease: Secondary | ICD-10-CM | POA: Diagnosis not present

## 2018-12-30 DIAGNOSIS — N186 End stage renal disease: Secondary | ICD-10-CM | POA: Diagnosis not present

## 2018-12-30 DIAGNOSIS — D509 Iron deficiency anemia, unspecified: Secondary | ICD-10-CM | POA: Diagnosis not present

## 2019-01-01 DIAGNOSIS — D631 Anemia in chronic kidney disease: Secondary | ICD-10-CM | POA: Diagnosis not present

## 2019-01-01 DIAGNOSIS — D509 Iron deficiency anemia, unspecified: Secondary | ICD-10-CM | POA: Diagnosis not present

## 2019-01-01 DIAGNOSIS — N186 End stage renal disease: Secondary | ICD-10-CM | POA: Diagnosis not present

## 2019-01-01 DIAGNOSIS — N2581 Secondary hyperparathyroidism of renal origin: Secondary | ICD-10-CM | POA: Diagnosis not present

## 2019-01-04 DIAGNOSIS — Z7682 Awaiting organ transplant status: Principal | ICD-10-CM

## 2019-01-04 DIAGNOSIS — N186 End stage renal disease: Secondary | ICD-10-CM

## 2019-01-04 DIAGNOSIS — Z01818 Encounter for other preprocedural examination: Secondary | ICD-10-CM

## 2019-01-04 DIAGNOSIS — D631 Anemia in chronic kidney disease: Secondary | ICD-10-CM | POA: Diagnosis not present

## 2019-01-04 DIAGNOSIS — D509 Iron deficiency anemia, unspecified: Secondary | ICD-10-CM | POA: Diagnosis not present

## 2019-01-04 DIAGNOSIS — N2581 Secondary hyperparathyroidism of renal origin: Secondary | ICD-10-CM | POA: Diagnosis not present

## 2019-01-06 DIAGNOSIS — N2581 Secondary hyperparathyroidism of renal origin: Secondary | ICD-10-CM | POA: Diagnosis not present

## 2019-01-06 DIAGNOSIS — N186 End stage renal disease: Secondary | ICD-10-CM | POA: Diagnosis not present

## 2019-01-06 DIAGNOSIS — D631 Anemia in chronic kidney disease: Secondary | ICD-10-CM | POA: Diagnosis not present

## 2019-01-06 DIAGNOSIS — D509 Iron deficiency anemia, unspecified: Secondary | ICD-10-CM | POA: Diagnosis not present

## 2019-01-08 DIAGNOSIS — N2581 Secondary hyperparathyroidism of renal origin: Secondary | ICD-10-CM | POA: Diagnosis not present

## 2019-01-08 DIAGNOSIS — N186 End stage renal disease: Secondary | ICD-10-CM | POA: Diagnosis not present

## 2019-01-08 DIAGNOSIS — D509 Iron deficiency anemia, unspecified: Secondary | ICD-10-CM | POA: Diagnosis not present

## 2019-01-08 DIAGNOSIS — D631 Anemia in chronic kidney disease: Secondary | ICD-10-CM | POA: Diagnosis not present

## 2019-01-11 DIAGNOSIS — D631 Anemia in chronic kidney disease: Secondary | ICD-10-CM | POA: Diagnosis not present

## 2019-01-11 DIAGNOSIS — N2581 Secondary hyperparathyroidism of renal origin: Secondary | ICD-10-CM | POA: Diagnosis not present

## 2019-01-11 DIAGNOSIS — N186 End stage renal disease: Secondary | ICD-10-CM | POA: Diagnosis not present

## 2019-01-11 DIAGNOSIS — D509 Iron deficiency anemia, unspecified: Secondary | ICD-10-CM | POA: Diagnosis not present

## 2019-01-13 DIAGNOSIS — N2581 Secondary hyperparathyroidism of renal origin: Secondary | ICD-10-CM | POA: Diagnosis not present

## 2019-01-13 DIAGNOSIS — D631 Anemia in chronic kidney disease: Secondary | ICD-10-CM | POA: Diagnosis not present

## 2019-01-13 DIAGNOSIS — D509 Iron deficiency anemia, unspecified: Secondary | ICD-10-CM | POA: Diagnosis not present

## 2019-01-13 DIAGNOSIS — N186 End stage renal disease: Secondary | ICD-10-CM | POA: Diagnosis not present

## 2019-01-15 DIAGNOSIS — T8612 Kidney transplant failure: Secondary | ICD-10-CM | POA: Diagnosis not present

## 2019-01-15 DIAGNOSIS — D509 Iron deficiency anemia, unspecified: Secondary | ICD-10-CM | POA: Diagnosis not present

## 2019-01-15 DIAGNOSIS — N2581 Secondary hyperparathyroidism of renal origin: Secondary | ICD-10-CM | POA: Diagnosis not present

## 2019-01-15 DIAGNOSIS — Z992 Dependence on renal dialysis: Secondary | ICD-10-CM | POA: Diagnosis not present

## 2019-01-15 DIAGNOSIS — D631 Anemia in chronic kidney disease: Secondary | ICD-10-CM | POA: Diagnosis not present

## 2019-01-15 DIAGNOSIS — N186 End stage renal disease: Secondary | ICD-10-CM | POA: Diagnosis not present

## 2019-01-18 DIAGNOSIS — Z992 Dependence on renal dialysis: Secondary | ICD-10-CM | POA: Diagnosis not present

## 2019-01-18 DIAGNOSIS — D509 Iron deficiency anemia, unspecified: Secondary | ICD-10-CM | POA: Diagnosis not present

## 2019-01-18 DIAGNOSIS — N2581 Secondary hyperparathyroidism of renal origin: Secondary | ICD-10-CM | POA: Diagnosis not present

## 2019-01-18 DIAGNOSIS — N186 End stage renal disease: Secondary | ICD-10-CM | POA: Diagnosis not present

## 2019-01-18 DIAGNOSIS — D631 Anemia in chronic kidney disease: Secondary | ICD-10-CM | POA: Diagnosis not present

## 2019-01-20 DIAGNOSIS — N2581 Secondary hyperparathyroidism of renal origin: Secondary | ICD-10-CM | POA: Diagnosis not present

## 2019-01-20 DIAGNOSIS — D509 Iron deficiency anemia, unspecified: Secondary | ICD-10-CM | POA: Diagnosis not present

## 2019-01-20 DIAGNOSIS — Z992 Dependence on renal dialysis: Secondary | ICD-10-CM | POA: Diagnosis not present

## 2019-01-20 DIAGNOSIS — N186 End stage renal disease: Secondary | ICD-10-CM | POA: Diagnosis not present

## 2019-01-20 DIAGNOSIS — D631 Anemia in chronic kidney disease: Secondary | ICD-10-CM | POA: Diagnosis not present

## 2019-01-22 DIAGNOSIS — Z992 Dependence on renal dialysis: Secondary | ICD-10-CM | POA: Diagnosis not present

## 2019-01-22 DIAGNOSIS — D631 Anemia in chronic kidney disease: Secondary | ICD-10-CM | POA: Diagnosis not present

## 2019-01-22 DIAGNOSIS — N2581 Secondary hyperparathyroidism of renal origin: Secondary | ICD-10-CM | POA: Diagnosis not present

## 2019-01-22 DIAGNOSIS — D509 Iron deficiency anemia, unspecified: Secondary | ICD-10-CM | POA: Diagnosis not present

## 2019-01-22 DIAGNOSIS — N186 End stage renal disease: Secondary | ICD-10-CM | POA: Diagnosis not present

## 2019-01-25 DIAGNOSIS — D509 Iron deficiency anemia, unspecified: Secondary | ICD-10-CM | POA: Diagnosis not present

## 2019-01-25 DIAGNOSIS — Z992 Dependence on renal dialysis: Secondary | ICD-10-CM | POA: Diagnosis not present

## 2019-01-25 DIAGNOSIS — N2581 Secondary hyperparathyroidism of renal origin: Secondary | ICD-10-CM | POA: Diagnosis not present

## 2019-01-25 DIAGNOSIS — D631 Anemia in chronic kidney disease: Secondary | ICD-10-CM | POA: Diagnosis not present

## 2019-01-25 DIAGNOSIS — N186 End stage renal disease: Secondary | ICD-10-CM | POA: Diagnosis not present

## 2019-01-27 DIAGNOSIS — D509 Iron deficiency anemia, unspecified: Secondary | ICD-10-CM | POA: Diagnosis not present

## 2019-01-27 DIAGNOSIS — N2581 Secondary hyperparathyroidism of renal origin: Secondary | ICD-10-CM | POA: Diagnosis not present

## 2019-01-27 DIAGNOSIS — D631 Anemia in chronic kidney disease: Secondary | ICD-10-CM | POA: Diagnosis not present

## 2019-01-27 DIAGNOSIS — N186 End stage renal disease: Secondary | ICD-10-CM | POA: Diagnosis not present

## 2019-01-27 DIAGNOSIS — Z992 Dependence on renal dialysis: Secondary | ICD-10-CM | POA: Diagnosis not present

## 2019-01-29 DIAGNOSIS — Z992 Dependence on renal dialysis: Secondary | ICD-10-CM | POA: Diagnosis not present

## 2019-01-29 DIAGNOSIS — N2581 Secondary hyperparathyroidism of renal origin: Secondary | ICD-10-CM | POA: Diagnosis not present

## 2019-01-29 DIAGNOSIS — D509 Iron deficiency anemia, unspecified: Secondary | ICD-10-CM | POA: Diagnosis not present

## 2019-01-29 DIAGNOSIS — D631 Anemia in chronic kidney disease: Secondary | ICD-10-CM | POA: Diagnosis not present

## 2019-01-29 DIAGNOSIS — N186 End stage renal disease: Secondary | ICD-10-CM | POA: Diagnosis not present

## 2019-02-01 ENCOUNTER — Encounter: Admit: 2019-02-01 | Discharge: 2019-02-01 | Payer: MEDICARE | Attending: Nephrology | Primary: Nephrology

## 2019-02-01 DIAGNOSIS — Z992 Dependence on renal dialysis: Secondary | ICD-10-CM | POA: Diagnosis not present

## 2019-02-01 DIAGNOSIS — D631 Anemia in chronic kidney disease: Secondary | ICD-10-CM | POA: Diagnosis not present

## 2019-02-01 DIAGNOSIS — D509 Iron deficiency anemia, unspecified: Secondary | ICD-10-CM | POA: Diagnosis not present

## 2019-02-01 DIAGNOSIS — N186 End stage renal disease: Secondary | ICD-10-CM | POA: Diagnosis not present

## 2019-02-01 DIAGNOSIS — N2581 Secondary hyperparathyroidism of renal origin: Secondary | ICD-10-CM | POA: Diagnosis not present

## 2019-02-03 DIAGNOSIS — D509 Iron deficiency anemia, unspecified: Secondary | ICD-10-CM | POA: Diagnosis not present

## 2019-02-03 DIAGNOSIS — N186 End stage renal disease: Secondary | ICD-10-CM | POA: Diagnosis not present

## 2019-02-03 DIAGNOSIS — D631 Anemia in chronic kidney disease: Secondary | ICD-10-CM | POA: Diagnosis not present

## 2019-02-03 DIAGNOSIS — N2581 Secondary hyperparathyroidism of renal origin: Secondary | ICD-10-CM | POA: Diagnosis not present

## 2019-02-03 DIAGNOSIS — Z992 Dependence on renal dialysis: Secondary | ICD-10-CM | POA: Diagnosis not present

## 2019-02-05 DIAGNOSIS — D631 Anemia in chronic kidney disease: Secondary | ICD-10-CM | POA: Diagnosis not present

## 2019-02-05 DIAGNOSIS — Z992 Dependence on renal dialysis: Secondary | ICD-10-CM | POA: Diagnosis not present

## 2019-02-05 DIAGNOSIS — N186 End stage renal disease: Secondary | ICD-10-CM | POA: Diagnosis not present

## 2019-02-05 DIAGNOSIS — D509 Iron deficiency anemia, unspecified: Secondary | ICD-10-CM | POA: Diagnosis not present

## 2019-02-05 DIAGNOSIS — N2581 Secondary hyperparathyroidism of renal origin: Secondary | ICD-10-CM | POA: Diagnosis not present

## 2019-02-08 DIAGNOSIS — N2581 Secondary hyperparathyroidism of renal origin: Secondary | ICD-10-CM | POA: Diagnosis not present

## 2019-02-08 DIAGNOSIS — D631 Anemia in chronic kidney disease: Secondary | ICD-10-CM | POA: Diagnosis not present

## 2019-02-08 DIAGNOSIS — D509 Iron deficiency anemia, unspecified: Secondary | ICD-10-CM | POA: Diagnosis not present

## 2019-02-08 DIAGNOSIS — N186 End stage renal disease: Secondary | ICD-10-CM | POA: Diagnosis not present

## 2019-02-08 DIAGNOSIS — Z992 Dependence on renal dialysis: Secondary | ICD-10-CM | POA: Diagnosis not present

## 2019-02-10 DIAGNOSIS — Z01818 Encounter for other preprocedural examination: Secondary | ICD-10-CM

## 2019-02-10 DIAGNOSIS — Z7682 Awaiting organ transplant status: Principal | ICD-10-CM

## 2019-02-10 DIAGNOSIS — N186 End stage renal disease: Secondary | ICD-10-CM

## 2019-02-10 DIAGNOSIS — Z992 Dependence on renal dialysis: Secondary | ICD-10-CM | POA: Diagnosis not present

## 2019-02-10 DIAGNOSIS — N2581 Secondary hyperparathyroidism of renal origin: Secondary | ICD-10-CM | POA: Diagnosis not present

## 2019-02-10 DIAGNOSIS — D631 Anemia in chronic kidney disease: Secondary | ICD-10-CM | POA: Diagnosis not present

## 2019-02-10 DIAGNOSIS — D509 Iron deficiency anemia, unspecified: Secondary | ICD-10-CM | POA: Diagnosis not present

## 2019-02-12 DIAGNOSIS — D631 Anemia in chronic kidney disease: Secondary | ICD-10-CM | POA: Diagnosis not present

## 2019-02-12 DIAGNOSIS — N2581 Secondary hyperparathyroidism of renal origin: Secondary | ICD-10-CM | POA: Diagnosis not present

## 2019-02-12 DIAGNOSIS — Z992 Dependence on renal dialysis: Secondary | ICD-10-CM | POA: Diagnosis not present

## 2019-02-12 DIAGNOSIS — D509 Iron deficiency anemia, unspecified: Secondary | ICD-10-CM | POA: Diagnosis not present

## 2019-02-12 DIAGNOSIS — N186 End stage renal disease: Secondary | ICD-10-CM | POA: Diagnosis not present

## 2019-02-15 DIAGNOSIS — N186 End stage renal disease: Secondary | ICD-10-CM | POA: Diagnosis not present

## 2019-02-15 DIAGNOSIS — Z23 Encounter for immunization: Secondary | ICD-10-CM | POA: Diagnosis not present

## 2019-02-15 DIAGNOSIS — D631 Anemia in chronic kidney disease: Secondary | ICD-10-CM | POA: Diagnosis not present

## 2019-02-15 DIAGNOSIS — D509 Iron deficiency anemia, unspecified: Secondary | ICD-10-CM | POA: Diagnosis not present

## 2019-02-15 DIAGNOSIS — N2581 Secondary hyperparathyroidism of renal origin: Secondary | ICD-10-CM | POA: Diagnosis not present

## 2019-02-15 DIAGNOSIS — Z992 Dependence on renal dialysis: Secondary | ICD-10-CM | POA: Diagnosis not present

## 2019-02-15 DIAGNOSIS — T8612 Kidney transplant failure: Secondary | ICD-10-CM | POA: Diagnosis not present

## 2019-02-16 ENCOUNTER — Ambulatory Visit (HOSPITAL_COMMUNITY)
Admission: EM | Admit: 2019-02-16 | Discharge: 2019-02-16 | Disposition: A | Discharge status: Home / Self Care | Payer: Medicare Other | Attending: Family Medicine | Admitting: Family Medicine

## 2019-02-16 ENCOUNTER — Other Ambulatory Visit: Payer: Self-pay

## 2019-02-16 ENCOUNTER — Encounter (HOSPITAL_COMMUNITY): Payer: Self-pay

## 2019-02-16 DIAGNOSIS — I10 Essential (primary) hypertension: Secondary | ICD-10-CM

## 2019-02-16 DIAGNOSIS — M79671 Pain in right foot: Secondary | ICD-10-CM | POA: Diagnosis not present

## 2019-02-16 MED ORDER — NAPROXEN 375 MG PO TABS: 375.0000 mg | ORAL_TABLET | Freq: Two times a day (BID) | ORAL | 0 refills | Status: DC

## 2019-02-16 NOTE — ED Notes (Signed)
Patient declined Cam Walker due to inability to pay for them. Ace wrap applied to right ankle.

## 2019-02-16 NOTE — ED Triage Notes (Signed)
Pt states he has pain in his right foot.

## 2019-02-17 DIAGNOSIS — N186 End stage renal disease: Secondary | ICD-10-CM | POA: Diagnosis not present

## 2019-02-17 DIAGNOSIS — D631 Anemia in chronic kidney disease: Secondary | ICD-10-CM | POA: Diagnosis not present

## 2019-02-17 DIAGNOSIS — Z992 Dependence on renal dialysis: Secondary | ICD-10-CM | POA: Diagnosis not present

## 2019-02-17 DIAGNOSIS — D509 Iron deficiency anemia, unspecified: Secondary | ICD-10-CM | POA: Diagnosis not present

## 2019-02-17 DIAGNOSIS — N2581 Secondary hyperparathyroidism of renal origin: Secondary | ICD-10-CM | POA: Diagnosis not present

## 2019-02-17 DIAGNOSIS — Z23 Encounter for immunization: Secondary | ICD-10-CM | POA: Diagnosis not present

## 2019-02-17 NOTE — ED Provider Notes (Addendum)
[name]   [id number] 02/16/19 Arrival Time: 2993  ASSESSMENT & PLAN:  1. Foot pain, right   2. Essential hypertension     No indications for imaging today. Likely early tendonitis of foot. Discussed. He would like to try wearing a walking boot given that he stands a work most of the day.  Trial of: Meds ordered this encounter  Medications  . naproxen (NAPROSYN) 375 MG tablet    Sig: Take 1 tablet (375 mg total) by mouth 2 (two) times daily with a meal.    Dispense:  14 tablet    Refill:  0   WBAT.  Recommend: Follow-up Information    [place].   Why: If worsening or failing to improve as anticipated. Contact information: [address] [place] [phone]         Will f/u with PCP re: blood pressure.  Reviewed expectations re: course of current medical issues. Questions answered. Outlined signs and symptoms indicating need for more acute intervention. Patient verbalized understanding. After Visit Summary given.  SUBJECTIVE: History from: patient. [name] is a 33 y.o. male who reports fairly persistent mild to moderate pain of his right inner foot; described as aching without radiation. Onset: gradual, several days ago. Injury/trama: no. Symptoms have progressed to a point and plateaued since beginning. Aggravating factors: certain movements and prolonged weight bearing. Alleviating factors: rest. Associated symptoms: none reported. Extremity sensation changes or weakness: none. Self treatment: has not tried OTCs for relief of pain. History of similar: no.  Past Surgical History:  Procedure Laterality Date  . AV FISTULA PLACEMENT     LEFT ARM  . KIDNEY TRANSPLANT    . KIDNEY TRANSPLANT    . NEPHRECTOMY TRANSPLANTED ORGAN    . PARATHYROIDECTOMY N/A 12/03/2015   Procedure: TOTAL PARATHYROIDECTOMY WITH  AUTOTRANSPLANT TO RIGHT FOREARM ;  Surgeon: Armandina Gemma, MD;   Location: [place];  Service: General;  Laterality: N/A;  . transplant kidney removed Right 03/20/2014    Increased blood pressure noted today. Reports that he is treated for hypertension.  He reports no chest pain on exertion, no dyspnea on exertion, no swelling of ankles, no orthostatic dizziness or lightheadedness, no orthopnea or paroxysmal nocturnal dyspnea, no palpitations and no intermittent claudication symptoms.  ROS: As per HPI. All other systems negative.    OBJECTIVE:  Vitals:   02/16/19 1709 02/16/19 1710  BP: (!) 145/94   Pulse: 80   Resp: 18   Temp: 99.3 F (37.4 C)   TempSrc: Oral   SpO2: 100%   Weight:  64.9 kg    General appearance: alert; no distress HEENT: [place]; AT Neck: supple with FROM CV: RRR Resp: unlabored respirations Extremities: . RLE: warm and well perfused; poorly localized mild to moderate tenderness over right inner foot towards anterior ankle; without gross deformities; with no swelling or edema; with no bruising; ROM: normal with reported discomfort CV: brisk extremity capillary refill of RLE; 2+ DP/PT pulse of RLE. Skin: warm and dry; no visible rashes Neurologic: gait normal but favors R foot; normal reflexes of RLE; normal sensation of RLE; normal strength of RLE Psychological: alert and cooperative; normal mood and affect  No Known Allergies  Past Medical History:  Diagnosis Date  . Alport syndrome   . Alport's syndrome    EFFECT KIDNEYS+ HEARING  . Anemia   . Blood dyscrasia   . GERD (gastroesophageal reflux disease)   . GERD (gastroesophageal  reflux disease)   . Headache   . Heart murmur    ASYMPTOMATIC  . Kidney failure as a baby   went on dialysis age 51      T39  . Sickle cell trait ([place])    Social History   Socioeconomic History  . Marital status: Single    Spouse name: Not on file  . Number of children: Not on file  . Years of education: Not on file  . Highest education level: Not on file  Occupational History   . Not on file  Social Needs  . Financial resource strain: Not on file  . Food insecurity    Worry: Not on file    Inability: Not on file  . Transportation needs    Medical: Not on file    Non-medical: Not on file  Tobacco Use  . Smoking status: Former Smoker    Types: Cigarettes  . Smokeless tobacco: Former Systems developer  . Tobacco comment: pt smoked last cig today, cant have nicotine in system while waiting for kidney for transplant   Substance and Sexual Activity  . Alcohol use: No  . Drug use: No  . Sexual activity: Not on file  Lifestyle  . Physical activity    Days per week: Not on file    Minutes per session: Not on file  . Stress: Not on file  Relationships  . Social Herbalist on phone: Not on file    Gets together: Not on file    Attends religious service: Not on file    Active member of club or organization: Not on file    Attends meetings of clubs or organizations: Not on file    Relationship status: Not on file  Other Topics Concern  . Not on file  Social History Narrative  . Not on file   Family History  Problem Relation Age of Onset  . Cancer Mother    Past Surgical History:  Procedure Laterality Date  . AV FISTULA PLACEMENT     LEFT ARM  . KIDNEY TRANSPLANT    . KIDNEY TRANSPLANT    . NEPHRECTOMY TRANSPLANTED ORGAN    . PARATHYROIDECTOMY N/A 12/03/2015   Procedure: TOTAL PARATHYROIDECTOMY WITH  AUTOTRANSPLANT TO RIGHT FOREARM ;  Surgeon: Armandina Gemma, MD;  Location: [place];  Service: General;  Laterality: N/A;  . transplant kidney removed Right 03/20/2014      Vanessa Kick, MD 02/17/19 6168    Vanessa Kick, MD 02/17/19 0830

## 2019-02-19 ENCOUNTER — Encounter: Admit: 2019-02-19 | Discharge: 2019-02-19 | Payer: MEDICARE | Attending: Surgery | Primary: Surgery

## 2019-02-19 DIAGNOSIS — Z7682 Awaiting organ transplant status: Secondary | ICD-10-CM

## 2019-02-19 DIAGNOSIS — Z992 Dependence on renal dialysis: Secondary | ICD-10-CM | POA: Diagnosis not present

## 2019-02-19 DIAGNOSIS — D631 Anemia in chronic kidney disease: Secondary | ICD-10-CM | POA: Diagnosis not present

## 2019-02-19 DIAGNOSIS — N2581 Secondary hyperparathyroidism of renal origin: Secondary | ICD-10-CM | POA: Diagnosis not present

## 2019-02-19 DIAGNOSIS — Z23 Encounter for immunization: Secondary | ICD-10-CM | POA: Diagnosis not present

## 2019-02-19 DIAGNOSIS — N186 End stage renal disease: Secondary | ICD-10-CM | POA: Diagnosis not present

## 2019-02-19 DIAGNOSIS — D509 Iron deficiency anemia, unspecified: Secondary | ICD-10-CM | POA: Diagnosis not present

## 2019-02-20 ENCOUNTER — Encounter
Admit: 2019-02-20 | Discharge: 2019-02-23 | Disposition: A | Discharge status: Home / Self Care | Payer: MEDICARE | Attending: Surgery | Admitting: Surgery

## 2019-02-20 ENCOUNTER — Encounter
Admit: 2019-02-20 | Discharge: 2019-02-23 | Disposition: A | Discharge status: Home / Self Care | Payer: MEDICARE | Attending: Certified Registered" | Admitting: Surgery

## 2019-02-20 ENCOUNTER — Ambulatory Visit
Admit: 2019-02-20 | Discharge: 2019-02-23 | Disposition: A | Discharge status: Home / Self Care | Payer: MEDICARE | Admitting: Surgery

## 2019-02-20 DIAGNOSIS — H9190 Unspecified hearing loss, unspecified ear: Secondary | ICD-10-CM

## 2019-02-20 DIAGNOSIS — Z20828 Contact with and (suspected) exposure to other viral communicable diseases: Secondary | ICD-10-CM

## 2019-02-20 DIAGNOSIS — I120 Hypertensive chronic kidney disease with stage 5 chronic kidney disease or end stage renal disease: Secondary | ICD-10-CM

## 2019-02-20 DIAGNOSIS — Z992 Dependence on renal dialysis: Secondary | ICD-10-CM

## 2019-02-20 DIAGNOSIS — Z905 Acquired absence of kidney: Secondary | ICD-10-CM

## 2019-02-20 DIAGNOSIS — Z87891 Personal history of nicotine dependence: Secondary | ICD-10-CM

## 2019-02-20 DIAGNOSIS — K219 Gastro-esophageal reflux disease without esophagitis: Secondary | ICD-10-CM

## 2019-02-20 DIAGNOSIS — Q8781 Alport syndrome: Secondary | ICD-10-CM

## 2019-02-20 DIAGNOSIS — T8611 Kidney transplant rejection: Secondary | ICD-10-CM

## 2019-02-20 DIAGNOSIS — N186 End stage renal disease: Secondary | ICD-10-CM

## 2019-02-20 DIAGNOSIS — D573 Sickle-cell trait: Secondary | ICD-10-CM

## 2019-02-20 DIAGNOSIS — E875 Hyperkalemia: Secondary | ICD-10-CM

## 2019-02-20 DIAGNOSIS — Z940 Kidney transplant status: Secondary | ICD-10-CM

## 2019-02-20 DIAGNOSIS — D631 Anemia in chronic kidney disease: Secondary | ICD-10-CM | POA: Diagnosis not present

## 2019-02-20 DIAGNOSIS — Z5181 Encounter for therapeutic drug level monitoring: Secondary | ICD-10-CM | POA: Diagnosis not present

## 2019-02-20 DIAGNOSIS — Z79899 Other long term (current) drug therapy: Secondary | ICD-10-CM | POA: Diagnosis not present

## 2019-02-20 NOTE — Unmapped (Signed)
Surgery History and Physical    Assessment/Plan:  Russell Wade is a 33 y.o. 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    - Admit to SRF  - NPO  - Posted and consented for renal txp  - OR this afternoon    History of Present Illness:  Russell Wade is a 33 y.o. male with a past medical history of as outlined in Epic presenting for renal transplant. ESRD 2/2 Alport's syndrome.    Patient had DDKT in 2009 complicated by graft failure due to late acute cellular rejection in 2011 and had transplant nephrectomy in 2015. HD through his LUE AVF since 2015. Patient rarely produces urine at this time. Denies UTIs or kidney stones.    Patient denies fevers/chills, nausea and emesis. His last dialysis session was yesterday through his LUE fistula.      Allergies  Patient has no known allergies.    Medications    Current Facility-Administered Medications   Medication Dose Route Frequency Provider Last Rate Last Dose   ??? acetaminophen (TYLENOL) tablet 975 mg  975 mg Oral For OR use Lysle Rubens, MD       ??? ceFAZolin (ANCEF) IVPB 2 g in 50 ml dextrose (premix)  2 g Intravenous For OR use Lysle Rubens, MD       ??? diphenhydrAMINE (BENADRYL) injection 50 mg  50 mg Intravenous For OR use Lysle Rubens, MD       ??? lactated Ringers infusion  10 mL/hr Intravenous Continuous Lysle Rubens, MD       ??? methylPREDNISolone sodium succinate (PF) (Solu-MEDROL) 500 mg in sodium chloride (NS) 0.9 % 50 mL IVPB  500 mg Intravenous For OR use Lysle Rubens, MD           Past Medical History  Past Medical History:   Diagnosis Date   ??? Alport syndrome    ??? ESRD (end stage renal disease) (CMS-HCC)    ??? Hearing impaired    ??? History of transfusion    ??? Hypertension    ??? Nephrotic syndrome    ??? Renal transplant disorder        Past Surgical History  Past Surgical History:   Procedure Laterality Date ??? ABDOMINAL SURGERY     ??? AV FISTULA PLACEMENT Left 2004    left arm   ??? FRACTURE SURGERY     ??? NEPHRECTOMY TRANSPLANTED ORGAN  2007-11-10    Deceased donor renal transplant 10-Nov-2007, episode of cellular acute rejection 11/2009     ??? PR REMOVAL OF RECIPIENT KIDNEY Right 03/20/2014    Procedure: RECIPIENT NEPHRECTOMY (SEPARATE PROCEDURE);  Surgeon: Vivi Barrack, MD;  [facility];  Service: Transplant       Family History  The patient's family history includes Alport syndrome in his cousin; Kidney disease in his maternal aunt, maternal grandmother, and mother..    Social History:  Social History     Socioeconomic History   ??? Marital status: Single     Spouse name: Not on file   ??? Number of children: Not on file   ??? Years of education: Not on file   ??? Highest education level: Not on file   Occupational History   ??? Not on file   Social Needs   ??? Financial resource strain: Not on file   ??? Food insecurity     Worry: Not on file  Inability: Not on file   ??? Transportation needs     Medical: Not on file     Non-medical: Not on file   Tobacco Use   ??? Smoking status: Former Smoker     Packs/day: 0.25     Years: 0.50     Pack years: 0.12     Types: Cigarettes     Quit date: 02/24/2015     Years since quitting: 3.9   ??? Smokeless tobacco: Never Used   ??? Tobacco comment: Occasional historical cigars, previously quit cigarettes in 2014 (9 years history of pack/day)   Substance and Sexual Activity   ??? Alcohol use: No     Comment: Rare occasions   ??? Drug use: No   ??? Sexual activity: Not on file   Lifestyle   ??? Physical activity     Days per week: Not on file     Minutes per session: Not on file   ??? Stress: Not on file   Relationships   ??? Social Wellsite geologist on phone: Not on file     Gets together: Not on file     Attends religious service: Not on file     Active member of club or organization: Not on file     Attends meetings of clubs or organizations: Not on file     Relationship status: Not on file   Other Topics Concern   ??? Not on file   Social History Narrative   ??? Not on file       Review of Systems  A 12 system review of systems was negative except as noted in HPI    Objective:     Wade Signs  Vitals:    02/20/19 0611   BP: 153/102   Pulse: 91   Resp: 18   Temp: 37.2 ??C   SpO2: 99%       Physical Exam  Gen: NAD  Head: NCAT  CV: RRR  Pulm: NWOB on RA  Abdomen: Soft NTND, right surgical incision well healed and approximated  Extremities: WWP, LUE fistula with good thrill  Skin: No rashes or lesions  Neuro: A&Ox3  Psych: Appropriate    Test Results  All lab results last 24 hours:    Recent Results (from the past 24 hour(s))   Comprehensive metabolic panel    Collection Time: 02/20/19  6:57 AM   Result Value Ref Range    Sodium 133 (L) 135 - 145 mmol/L    Potassium 4.7 3.5 - 5.0 mmol/L    Chloride 92 (L) 98 - 107 mmol/L    Anion Gap 15 7 - 15 mmol/L    CO2 26.0 22.0 - 30.0 mmol/L    BUN 27 (H) 7 - 21 mg/dL    Creatinine 1.61 (H) 0.70 - 1.30 mg/dL    BUN/Creatinine Ratio 3     EGFR CKD-EPI Non-African American, Male 7 (L) >=60 mL/min/1.17m2    EGFR CKD-EPI African American, Male 8 (L) >=60 mL/min/1.51m2    Glucose 85 70 - 179 mg/dL    Calcium 09.6 (H) 8.5 - 10.2 mg/dL    Albumin 4.9 3.5 - 5.0 g/dL    Total Protein 8.4 (H) 6.5 - 8.3 g/dL    Total Bilirubin 0.9 0.0 - 1.2 mg/dL    AST 29 19 - 55 U/L    ALT 17 <50 U/L    Alkaline Phosphatase 80 38 - 126 U/L   Magnesium Level  Collection Time: 02/20/19  6:57 AM   Result Value Ref Range    Magnesium 2.0 1.6 - 2.2 mg/dL   Phosphorus Level    Collection Time: 02/20/19  6:57 AM   Result Value Ref Range    Phosphorus 7.1 (H) 2.9 - 4.7 mg/dL   Uric acid    Collection Time: 02/20/19  6:57 AM   Result Value Ref Range    Uric Acid 3.4 (L) 4.0 - 9.0 mg/dL   Gamma GT (GGT)    Collection Time: 02/20/19  6:57 AM   Result Value Ref Range    GGT 14 12 - 109 U/L   Amylase Level    Collection Time: 02/20/19  6:57 AM   Result Value Ref Range    Amylase 168 (H) 30 - 110 U/L   Protime-INR Collection Time: 02/20/19  6:57 AM   Result Value Ref Range    PT 11.7 10.2 - 13.1 sec    INR 1.02    APTT    Collection Time: 02/20/19  6:57 AM   Result Value Ref Range    APTT 32.6 25.3 - 37.1 sec    Heparin Correlation 0.2    CBC w/ Differential    Collection Time: 02/20/19  6:57 AM   Result Value Ref Range    WBC 4.8 4.5 - 11.0 10*9/L    RBC 4.12 (L) 4.50 - 5.90 10*12/L    HGB 11.6 (L) 13.5 - 17.5 g/dL    HCT 24.4 (L) 01.0 - 53.0 %    MCV 85.7 80.0 - 100.0 fL    MCH 28.2 26.0 - 34.0 pg    MCHC 32.9 31.0 - 37.0 g/dL    RDW 27.2 (H) 53.6 - 15.0 %    MPV 10.0 7.0 - 10.0 fL    Platelet 178 150 - 440 10*9/L    Neutrophils % 53.5 %    Lymphocytes % 31.8 %    Monocytes % 7.0 %    Eosinophils % 4.8 %    Basophils % 0.8 %    Absolute Neutrophils 2.6 2.0 - 7.5 10*9/L    Absolute Lymphocytes 1.5 1.5 - 5.0 10*9/L    Absolute Monocytes 0.3 0.2 - 0.8 10*9/L    Absolute Eosinophils 0.2 0.0 - 0.4 10*9/L    Absolute Basophils 0.0 0.0 - 0.1 10*9/L    Large Unstained Cells 2 0 - 4 %    Microcytosis Slight (A) Not Present    Anisocytosis Slight (A) Not Present   Parathyroid Hormone (PTH) (Pre-op)    Collection Time: 02/20/19  6:57 AM   Result Value Ref Range    PTH 124.7 (H) 12.0 - 72.0 pg/mL    Calcium 10.7 (H) 8.5 - 10.2 mg/dL       Imaging:   No results found.

## 2019-02-21 DIAGNOSIS — Z7682 Awaiting organ transplant status: Secondary | ICD-10-CM

## 2019-02-21 DIAGNOSIS — I120 Hypertensive chronic kidney disease with stage 5 chronic kidney disease or end stage renal disease: Secondary | ICD-10-CM

## 2019-02-21 DIAGNOSIS — Z905 Acquired absence of kidney: Secondary | ICD-10-CM

## 2019-02-21 DIAGNOSIS — T8611 Kidney transplant rejection: Secondary | ICD-10-CM

## 2019-02-21 DIAGNOSIS — H9190 Unspecified hearing loss, unspecified ear: Secondary | ICD-10-CM

## 2019-02-21 DIAGNOSIS — E875 Hyperkalemia: Secondary | ICD-10-CM

## 2019-02-21 DIAGNOSIS — Z992 Dependence on renal dialysis: Secondary | ICD-10-CM

## 2019-02-21 DIAGNOSIS — K219 Gastro-esophageal reflux disease without esophagitis: Secondary | ICD-10-CM

## 2019-02-21 DIAGNOSIS — D573 Sickle-cell trait: Secondary | ICD-10-CM

## 2019-02-21 DIAGNOSIS — N186 End stage renal disease: Secondary | ICD-10-CM

## 2019-02-21 DIAGNOSIS — Z20828 Contact with and (suspected) exposure to other viral communicable diseases: Secondary | ICD-10-CM

## 2019-02-21 DIAGNOSIS — Z940 Kidney transplant status: Secondary | ICD-10-CM

## 2019-02-21 DIAGNOSIS — Q8781 Alport syndrome: Secondary | ICD-10-CM

## 2019-02-21 DIAGNOSIS — Z87891 Personal history of nicotine dependence: Secondary | ICD-10-CM

## 2019-02-21 NOTE — Unmapped (Signed)
Transplant Surgery Progress Note    Hospital Day: 2    Assessment:     Xzander Gilham is a 33 y.o. 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 Patient now POD 1 from DDKT on 02/20/2019.    Subjective/Interval Events:     Hyperkalemic this AM to 6.5. Patient asymptomatic.    Plan:     - Urgent dialysis this AM  - EKG and calcium gluconate ordered  - Medlock  - Clear liquid diet  - Prograf + MMF  - Holding Bactrim given hyperkalemia  - Continue to hold ASA and SQH    Objective:      Vital Signs:  BP 155/93  - Pulse 75  - Temp 36.7 ??C (Oral)  - Resp 20  - Ht 175.3 cm (5' 9)  - Wt 75.8 kg (167 lb 1.7 oz)  - SpO2 99%  - BMI 24.68 kg/m??     Physical Exam:    General: NAD  Neuro: Alert and oriented, conversational and appropriate  Cardiovascular: RR  Pulmonary: Normal work of breathing on RA  Abdomen: Soft, appropriately tender, nondistended, JP with sanguinous output 110 cc  Extremities: WWP    ____________________  Lysle Rubens, MD  General Surgery, PGY3

## 2019-02-21 NOTE — Unmapped (Signed)
Brief Operative Note  (CSN: [id number])      Date of Surgery: 02/20/2019    Pre-op Diagnosis: ERSD    Post-op Diagnosis: same    Procedure(s):  RENAL ALLOTRANSPLANTATION, IMPLANTATION OF GRAFT; WITHOUT RECIPIENT NEPHRECTOMY: 50360 (CPT??)  BACKBENCH RECONSTRUCTION CADAVER/LIVING DONOR RENAL ALLOGRAFT PRIOR TO TRANSPLANT; ARTERIAL ANASTOMOSIS EAC: 98119 (CPT??)  Note: Revisions to procedures should be made in chart - see Procedures activity.    Performing Service: Transplant  Surgeon(s) and Role:     * Leona Carry, MD - Primary     * Lysle Rubens, MD - Resident - Assisting    Assistant: None    Findings: Patent renal vasculature from donor kidney, single artery and vein, 2 ureters.    Anesthesia: General    Estimated Blood Loss: 100 mL    Complications: None    Specimens:   ID Type Source Tests Collected by Time Destination   1 : DONOR KIDNEY BX @ 0 HOURS - PLACED IN MICHELS SOL'N  Biopsy Kidney, Left SURGICAL PATHOLOGY EXAM Leona Carry, MD 02/20/2019 1800    2 : DONOR KIDNEY BIOPSY @ 0 HOURS - PLACED IN FORMALIN SOL'N Biopsy Kidney, Left SURGICAL PATHOLOGY EXAM Leona Carry, MD 02/20/2019 1805        Implants:   Implant Name Type Inv. Item Serial No. Manufacturer Lot No. LRB No. Used Action   STENT URETERAL DOUBLE J [id number] - [id number] Graft STENT URETERAL DOUBLE J [id number]  [employer] [id number] Left 1 Implanted   STENT URETERAL DOUBLE J [id number] - [id number] Graft STENT URETERAL DOUBLE J [id number]  [employer]  Left 1 Wasted   STENT URETERAL DOUBLE J [id number] - [id number] Graft STENT URETERAL DOUBLE J [id number]  [employer] [id number] Left 1 Implanted       Surgeon Notes: Dr. Norma Fredrickson was present and scrubbed for the entire procedure    Lysle Rubens   Date: 02/20/2019  Time: 7:48 PM

## 2019-02-22 MED ORDER — AMLODIPINE 5 MG TABLET
ORAL_TABLET | Freq: Every day | ORAL | 11 refills | 30.00000 days | Status: CP
Start: 2019-02-22 — End: 2020-02-22
  Filled 2019-02-23: qty 60, 30d supply, fill #0

## 2019-02-22 MED ORDER — POLYETHYLENE GLYCOL 3350 17 GRAM ORAL POWDER PACKET
Freq: Every day | ORAL | 0 refills | 30.00000 days | Status: CP | PRN
Start: 2019-02-22 — End: 2019-03-07
  Filled 2019-02-23: qty 30, 30d supply, fill #0

## 2019-02-22 MED ORDER — ACETAMINOPHEN 500 MG TABLET
ORAL_TABLET | Freq: Four times a day (QID) | ORAL | 0 refills | 13 days | Status: CP | PRN
Start: 2019-02-22 — End: 2019-03-14
  Filled 2019-02-23: qty 100, 12d supply, fill #0

## 2019-02-22 MED ORDER — ASPIRIN 81 MG TABLET,DELAYED RELEASE
ORAL_TABLET | Freq: Every day | ORAL | 11 refills | 30 days | Status: CP
Start: 2019-02-22 — End: 2019-03-11
  Filled 2019-02-23: qty 30, 30d supply, fill #0

## 2019-02-22 MED ORDER — DOCUSATE SODIUM 100 MG CAPSULE
ORAL_CAPSULE | Freq: Two times a day (BID) | ORAL | 0 refills | 30 days | Status: CP | PRN
Start: 2019-02-22 — End: 2019-03-07
  Filled 2019-02-23: qty 60, 30d supply, fill #0

## 2019-02-22 MED ORDER — VALGANCICLOVIR 450 MG TABLET
ORAL_TABLET | Freq: Every day | ORAL | 2 refills | 30.00000 days | Status: CP
Start: 2019-02-22 — End: 2019-02-23

## 2019-02-22 MED ORDER — TRAMADOL 50 MG TABLET
ORAL_TABLET | Freq: Two times a day (BID) | ORAL | 0 refills | 8.00000 days | Status: CP | PRN
Start: 2019-02-22 — End: 2019-03-25
  Filled 2019-02-23: qty 30, 7d supply, fill #0

## 2019-02-22 MED ORDER — MYFORTIC 180 MG TABLET,DELAYED RELEASE
ORAL_TABLET | Freq: Two times a day (BID) | ORAL | 11 refills | 30 days | Status: CP
Start: 2019-02-22 — End: 2019-02-22
  Filled 2019-02-23: qty 180, 30d supply, fill #0

## 2019-02-22 MED ORDER — MYCOPHENOLATE SODIUM 180 MG TABLET,DELAYED RELEASE
ORAL_TABLET | Freq: Two times a day (BID) | ORAL | 11 refills | 30.00000 days | Status: CP
Start: 2019-02-22 — End: 2019-02-23

## 2019-02-22 MED ORDER — MG-PLUS-PROTEIN 133 MG TABLET
ORAL_TABLET | Freq: Two times a day (BID) | ORAL | 11 refills | 0 days | Status: CP
Start: 2019-02-22 — End: ?
  Filled 2019-02-23: qty 100, 50d supply, fill #0

## 2019-02-22 MED ORDER — GABAPENTIN 100 MG CAPSULE
ORAL_CAPSULE | Freq: Every evening | ORAL | 0 refills | 12.00000 days | Status: CP
Start: 2019-02-22 — End: 2019-03-07
  Filled 2019-02-23: qty 12, 12d supply, fill #0

## 2019-02-22 MED ORDER — PROGRAF 1 MG CAPSULE
ORAL_CAPSULE | Freq: Two times a day (BID) | ORAL | 11 refills | 30.00000 days | Status: CP
Start: 2019-02-22 — End: 2019-02-22

## 2019-02-22 MED ORDER — AMLODIPINE 10 MG TABLET
ORAL_TABLET | Freq: Every day | ORAL | 11 refills | 30.00000 days | Status: CP
Start: 2019-02-22 — End: 2019-02-22

## 2019-02-22 MED ORDER — TACROLIMUS 1 MG CAPSULE
ORAL_CAPSULE | Freq: Two times a day (BID) | ORAL | 11 refills | 30.00000 days | Status: CP
Start: 2019-02-22 — End: 2019-02-23

## 2019-02-22 NOTE — Unmapped (Signed)
Abdominal Transplant Inpatient Pharmacy Education Note    Date: 02/22/2019    Patient: Russell Wade    s/p Kidney transplant 02/20/19    Discharge Education Provided: ??    ? Provided patient a medication card with list of all discharge medications. Discussed anti-rejection and anti-infective medication regimens in depth. The patient's full medication schedule was discussed in detail with the patient and included drug name, indication, dose, appropriate timing of self-administration, drug monitoring, drug/drug interaction drug/food interaction, herbal/over-the-counter medication use, side effect profile, and generic versus brand formulation.    ? Adverse effects of tacrolimus discussed: Nephrotoxicity, neurotoxicity including headache, tremor and possible seizures and metabolic disturbances including hypertension, hyperglycemia and hyperlipidemia. The patient verbalized understanding regarding not to take their calcineurin inhibitor prior to lab draws to ensure accurate trough levels.    ? Adverse effects of mycophenolic acid: Decreased blood counts, infection and most commonly gastrointestinal distress consisting of diarrhea, nausea and abdominal pain.      ? Stressed the importance of medication adherence to transplant outcomes.    ? Myfortic Medication Guide will be given at time of discharge.     ? Further discussed the avoidance of grapefruit and grapefruit containing beverages and food as it interacts with their calcineurin inhibitor and the importance of sunscreen use.      The patient demonstrated a good understanding of the discharge medications. Patient was engaged and asked insightful questions. The patient was instructed to bring the card and pill bottles to their first follow up visit. All questions and concerns were answered.    25-60 minutes of discharge education was completed with this patient.  To promote optimum therapeutic outcomes and prevent medication errors, the patient???s medication profile was reviewed daily by pharmacy and discussed with the multidisciplinary transplant team.     Brent Bulla,  PharmD Candidate

## 2019-02-22 NOTE — Unmapped (Signed)
Transplant Surgery Progress Note    Hospital Day: 3    Assessment:     Russell Wade is a 33 y.o. 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 Patient now POD 2 from DDKT on 02/20/2019.    Subjective/Interval Events:     NAEO. Tolerating CLD. Pain well controlled.    Plan:     - Advance to regular diet  - Prograf + MMF  - Continue to hold Bactrim given hyperkalemia  - Start SQH, hold ASA  - Floor status    Objective:      Vital Signs:  BP 166/104  - Pulse 88  - Temp 36.6 ??C (Oral)  - Resp 15  - Ht 175.3 cm (5' 9)  - Wt 72 kg (158 lb 11.7 oz)  - SpO2 98%  - BMI 23.44 kg/m??     Physical Exam:    General: NAD  Neuro: Alert and oriented, conversational and appropriate  Cardiovascular: RR  Pulmonary: Normal work of breathing on RA  Abdomen: Soft, appropriately tender, nondistended, JP with sanguinous output 26 cc  Extremities: WWP    ____________________  Lysle Rubens, MD  General Surgery, PGY3

## 2019-02-23 DIAGNOSIS — Q8781 Alport syndrome: Secondary | ICD-10-CM

## 2019-02-23 DIAGNOSIS — Z79899 Other long term (current) drug therapy: Secondary | ICD-10-CM

## 2019-02-23 DIAGNOSIS — Z940 Kidney transplant status: Secondary | ICD-10-CM

## 2019-02-23 DIAGNOSIS — T8611 Kidney transplant rejection: Secondary | ICD-10-CM

## 2019-02-23 DIAGNOSIS — Z20828 Contact with and (suspected) exposure to other viral communicable diseases: Secondary | ICD-10-CM

## 2019-02-23 DIAGNOSIS — Z992 Dependence on renal dialysis: Secondary | ICD-10-CM

## 2019-02-23 DIAGNOSIS — E875 Hyperkalemia: Secondary | ICD-10-CM

## 2019-02-23 DIAGNOSIS — Z905 Acquired absence of kidney: Secondary | ICD-10-CM

## 2019-02-23 DIAGNOSIS — H9190 Unspecified hearing loss, unspecified ear: Secondary | ICD-10-CM

## 2019-02-23 DIAGNOSIS — I120 Hypertensive chronic kidney disease with stage 5 chronic kidney disease or end stage renal disease: Secondary | ICD-10-CM

## 2019-02-23 DIAGNOSIS — D573 Sickle-cell trait: Secondary | ICD-10-CM

## 2019-02-23 DIAGNOSIS — N186 End stage renal disease: Secondary | ICD-10-CM

## 2019-02-23 DIAGNOSIS — Z87891 Personal history of nicotine dependence: Secondary | ICD-10-CM

## 2019-02-23 DIAGNOSIS — K219 Gastro-esophageal reflux disease without esophagitis: Secondary | ICD-10-CM

## 2019-02-23 LAB — BASIC METABOLIC PANEL
ANION GAP: 12 mmol/L (ref 7–15)
BLOOD UREA NITROGEN: 52 mg/dL — ABNORMAL HIGH (ref 7–21)
BUN / CREAT RATIO: 5
CALCIUM: 9 mg/dL (ref 8.5–10.2)
CHLORIDE: 97 mmol/L — ABNORMAL LOW (ref 98–107)
CREATININE: 10.5 mg/dL — ABNORMAL HIGH (ref 0.70–1.30)
EGFR CKD-EPI AA MALE: 7 mL/min/{1.73_m2} — ABNORMAL LOW (ref >=60)
EGFR CKD-EPI NON-AA MALE: 6 mL/min/{1.73_m2} — ABNORMAL LOW (ref >=60)
GLUCOSE RANDOM: 112 mg/dL (ref 70–179)
POTASSIUM: 5 mmol/L (ref 3.5–5.0)
SODIUM: 133 mmol/L — ABNORMAL LOW (ref 135–145)

## 2019-02-23 LAB — CBC
HEMATOCRIT: 28.6 % — ABNORMAL LOW (ref 41.0–53.0)
HEMOGLOBIN: 9.5 g/dL — ABNORMAL LOW (ref 13.5–17.5)
MEAN CORPUSCULAR HEMOGLOBIN CONC: 33.2 g/dL (ref 31.0–37.0)
MEAN CORPUSCULAR HEMOGLOBIN: 28.4 pg (ref 26.0–34.0)
MEAN PLATELET VOLUME: 9.7 fL (ref 7.0–10.0)
PLATELET COUNT: 130 10*9/L — ABNORMAL LOW (ref 150–440)
RED BLOOD CELL COUNT: 3.35 10*12/L — ABNORMAL LOW (ref 4.50–5.90)
RED CELL DISTRIBUTION WIDTH: 18.2 % — ABNORMAL HIGH (ref 12.0–15.0)
WBC ADJUSTED: 6.6 10*9/L (ref 4.5–11.0)

## 2019-02-23 LAB — MAGNESIUM: Magnesium:MCnc:Pt:Ser/Plas:Qn:: 2.4 — ABNORMAL HIGH

## 2019-02-23 LAB — TACROLIMUS, TROUGH: Lab: 3 — ABNORMAL LOW

## 2019-02-23 LAB — PHOSPHORUS
PHOSPHORUS: 7.9 mg/dL — ABNORMAL HIGH (ref 2.9–4.7)
Phosphate:MCnc:Pt:Ser/Plas:Qn:: 7.9 — ABNORMAL HIGH

## 2019-02-23 LAB — RED CELL DISTRIBUTION WIDTH: Lab: 18.2 — ABNORMAL HIGH

## 2019-02-23 LAB — ANION GAP: Anion gap 3:SCnc:Pt:Ser/Plas:Qn:: 12

## 2019-02-23 MED ORDER — PROGRAF 1 MG CAPSULE
ORAL_CAPSULE | Freq: Two times a day (BID) | ORAL | 11 refills | 30.00000 days | Status: CP
Start: 2019-02-23 — End: 2019-02-28
  Filled 2019-02-23: qty 300, 30d supply, fill #0

## 2019-02-23 MED ORDER — MYFORTIC 180 MG TABLET,DELAYED RELEASE
ORAL_TABLET | Freq: Two times a day (BID) | ORAL | 11 refills | 30.00000 days | Status: CP
Start: 2019-02-23 — End: 2019-03-02

## 2019-02-23 MED ORDER — PROGRAF 1 MG CAPSULE: 5 mg | capsule | Freq: Two times a day (BID) | 11 refills | 30 days | Status: AC

## 2019-02-23 MED ORDER — SULFAMETHOXAZOLE 400 MG-TRIMETHOPRIM 80 MG TABLET
ORAL_TABLET | ORAL | 5 refills | 28.00000 days | Status: CP
Start: 2019-02-23 — End: 2019-08-22
  Filled 2019-02-23: qty 12, 28d supply, fill #0

## 2019-02-23 MED FILL — PROGRAF 1 MG CAPSULE: 30 days supply | Qty: 300 | Fill #0 | Status: AC

## 2019-02-23 MED FILL — VALGANCICLOVIR 450 MG TABLET: 30 days supply | Qty: 30 | Fill #0 | Status: AC

## 2019-02-23 MED FILL — POLYETHYLENE GLYCOL 3350 17 GRAM ORAL POWDER PACKET: 30 days supply | Qty: 30 | Fill #0 | Status: AC

## 2019-02-23 MED FILL — SULFAMETHOXAZOLE 400 MG-TRIMETHOPRIM 80 MG TABLET: 28 days supply | Qty: 12 | Fill #0 | Status: AC

## 2019-02-23 MED FILL — AMLODIPINE 5 MG TABLET: 30 days supply | Qty: 60 | Fill #0 | Status: AC

## 2019-02-23 MED FILL — MG-PLUS-PROTEIN 133 MG TABLET: 50 days supply | Qty: 100 | Fill #0 | Status: AC

## 2019-02-23 MED FILL — DOK 100 MG CAPSULE: 30 days supply | Qty: 60 | Fill #0 | Status: AC

## 2019-02-23 MED FILL — MYFORTIC 180 MG TABLET,DELAYED RELEASE: 30 days supply | Qty: 180 | Fill #0 | Status: AC

## 2019-02-23 MED FILL — ACETAMINOPHEN 500 MG TABLET: 12 days supply | Qty: 100 | Fill #0 | Status: AC

## 2019-02-23 MED FILL — GABAPENTIN 100 MG CAPSULE: 12 days supply | Qty: 12 | Fill #0 | Status: AC

## 2019-02-23 MED FILL — TRAMADOL 50 MG TABLET: 7 days supply | Qty: 30 | Fill #0 | Status: AC

## 2019-02-23 MED FILL — ASPIRIN 81 MG TABLET,DELAYED RELEASE: 30 days supply | Qty: 30 | Fill #0 | Status: AC

## 2019-02-23 NOTE — Unmapped (Signed)
Tacrolimus Therapeutic Monitoring Pharmacy Note    Russell Wade is a 33 y.o. male continuing tacrolimus.     Indication: Kidney transplant     Date of Transplant: 02/20/19      Prior Dosing Information: Current regimen 5 mg BID      Goals:  Therapeutic Drug Levels  Tacrolimus trough goal: 8-10 ng/mL    Additional Clinical Monitoring/Outcomes  ?? Monitor renal function (SCr and urine output) and liver function (LFTs)  ?? Monitor for signs/symptoms of adverse events (e.g., hyperglycemia, hyperkalemia, hypomagnesemia, hypertension, headache, tremor)    Results:   Tacrolimus level: 3 ng/mL, drawn appropriately    Pharmacokinetic Considerations and Significant Drug Interactions:  ? Concurrent hepatotoxic medications: None identified  ? Concurrent CYP3A4 substrates/inhibitors: None identified  ? Concurrent nephrotoxic medications: Bactrim    Assessment/Plan:  Recommendedation(s)  ? Increase to 6 mg BID    Follow-up  ? Daily tac levels in AM.   ? A pharmacist will continue to monitor and recommend levels as appropriate    Please page service pharmacist with questions/clarifications.    Vertis Kelch, PharmD

## 2019-02-23 NOTE — Unmapped (Addendum)
The below services have been ordered for you by your medical team to help your health and safety at home.    Dialysis Facility: [facility]  Phone:  [phone]  Start of Care:  [facility] 02/24/2019  Schedule: return to TTS/1st shift, arrive 7:30am      Please contact your Post-Transplant Coordinator if you have any problems/concerns:  Margaretha Glassing  [phone]; [phone]

## 2019-02-23 NOTE — Unmapped (Signed)
Education Follow up Assessment (Patient may utilize resources: family, booklet, med list.)   Re-educate on all incorrect responses and reassess those at conclusion of session.       1) Who do you call on nights, weekends, and holidays if you are having a transplant issue? On call coordinator 2e     2) Name 2 reasons to call the on-call coordinator. Severe pain in the kidney area, BP, sick    3) When do you take your morning medicines on lab draw days? after     4) Can you name one your anti-rejection medicines? yes    5) Can you name one your anti-infectious medicines? yes    6) What are you responsible for monitoring when you are at home? Intake,  BP, T, Wt, output    7) How do you prevent rejection? take my medicines on time or equivalent    8) Have you spoken with a dietician about safe food handling?  yes     9) Do you have any questions about safe food handling?  no    10) Have you spoken with a pharmacist?  yes    11) Do you feel comfortable going home?  yes    Loma Boston, RN Inpatient Abdominal Transplant Nurse Coordinator 02/23/2019 1:13 PM

## 2019-02-23 NOTE — Unmapped (Signed)
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 Alport syndrome    ??? ESRD (end stage renal disease) (CMS-HCC)    ??? Hearing impaired    ??? History of transfusion    ??? Hypertension    ??? Nephrotic syndrome    ??? Renal transplant disorder        Immunosuppression regimen:  Prograf 6 mg BID; goal 8-10 ng/mL   Myfortic 540 mg BID    Antimicrobials during admission:   CMV: D+/R+ -> Valcyte twice a week (renal dose adjust) x 3 months  PJP: Bactrim SS MWF x 6 months  Nystatin while inpatient    Medication changes to be instituted upon discharge:  New medications-  - Immunosuppression: as above  - Antimicrobials as above  - Bowel regimen: Docusate 100 mg BID PRN and Miralax 17 gm daily PRN  - Pain regimen: Oxycodone (home med - chronic med) and APAP PRN  - CV/heme: aspirin 81 mg daily on hold, amlodipine 10 mg daily    Continued home medications: ambien PRN and hydroxyzine PRN    Home medications stopped: renal vitamin, PhosLo, benadryl  Potential adverse effects during admission  Since last visit, does patient have YES NO Tac Dose Modification NOTES      Increase Decrease No Change    1 Neurotoxicity (tremor, paresthesias, tingling, seizures, or headache) []  [x]  []  []  []     2 Nephrotoxicity related to tacrolimus []  [x]  []  []  []     3 Diarrhea, constipation []  [x]  []  []  []     4 Peripheral edema []  [x]  []  []  []     5 Hypertension [x]  []  []  []  []  Was on anti-hypertensive medications in past; wasn't on any pre-transplant   6 Hyperglycemia []  [x]  []  []  []     7 Other adverse events []  [x]  []  []  []         Suggested monitoring for outpatient follow-up:   1. Ca: Continue to monitor Ca level. No recent fill hx of calcitriol. Would recommend adding on if Ca level lowers post-transplant.  2. DGF - will remain on iHD at discharge. Consider adding back on phos binder and renal vitamin depending on duration of iHD. Aspirin is on hold in case patient would need a renal biopsy outpatient.  3. Due to repeat transplant and high cPRA, could consider increasing Myfortic dose or adding on prednisone if tacrolimus level is slow to become therapeutic.    Vertis Kelch,  PharmD

## 2019-02-23 NOTE — Unmapped (Addendum)
Russell Wade is a 33 y.o. male with history of ESRD on HD, Alport's syndrome, renal transplant c/b rejection, and HTN who was admitted to the hospital on 02/20/2019 for kidney transplant. The patient was taken to the OR on 02/20/2019 for a kidney transplant. He tolerated the procedure well, was extubated in the OR, and was taken to the PACU where he received routine postoperative care before being transferred to the surgical stepdown unit for close observation and cardiorespiratory monitoring.     The allograft was evaluated with ultrasonography and found to have resistive indices in the transplant arteries within normal limits, mildly increased velocity in the main renal artery at the level of the anastomosis, and small perinephric fluid collection along the inferior pole of the transplant kidney.       He was initially placed on 1:1 UOP/IVF replacement and then transitioned off of maintenance IV fluids when he tolerated PO intake. He was clinically stable postoperatively and was transferred to the floor on POD#2. He had low UOP and continued to require dialysis during his hospitalization. Foley was d/c'd on POD#3 and he passed his TOV. His perinephric drain, which was placed intraop, had stably low, serosanguinous output and was pulled on POD3.      He otherwise did well - diet was slowly advanced and at the time of discharge he was tolerating a regular diet. At the time of discharge, his pain was controlled with PO pain medication and he had returned to the preoperative ambulatory status. Anti-rejection medication levels were monitored and dosages adjusted to maintain a therapeutic regimen. The patient was seen and assessed by Physical Therapy and deemed suitable for discharge. He will be discharged home on POD#3 in stable condition without home healthcare and planned outpatient follow up.

## 2019-02-23 NOTE — Unmapped (Signed)
Transplant Surgery Progress Note    Hospital Day: 4    Assessment:     Russell Wade is a 33 y.o. 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 Patient now POD 3 from DDKT on 02/20/2019.    Subjective/Interval Events:     NAEO, afebrile with stable vital signs. Tolerating regular diet without N/V. Pain well controlled.    Plan:     - Continue regular diet  - Prograf + MMF  - Continue to hold Bactrim given hyperkalemia  - Will receive dialysis today  - Discontinue foley catheter, TOV  - Continue SQH, hold ASA  - Floor status    Objective:      Vital Signs:  BP 164/107  - Pulse 81  - Temp 36.7 ??C (Oral)  - Resp 18  - Ht 175.3 cm (5' 9)  - Wt 72 kg (158 lb 11.7 oz)  - SpO2 100%  - BMI 23.44 kg/m??     Physical Exam:    General: NAD  Neuro: Alert and oriented, conversational and appropriate  Cardiovascular: hemodynamically stable  Pulmonary: Normal work of breathing on RA  Abdomen: Soft, appropriately tender, nondistended, JP with sanguinous output 32 cc  Extremities: [facility]

## 2019-02-23 NOTE — Unmapped (Signed)
[facility] Nephrology Hemodialysis Procedure Note     02/23/2019    Russell Wade was seen and examined on hemodialysis    CHIEF COMPLAINT: End Stage Renal Disease    INTERVAL HISTORY: Patient without complaints, s/p DDKT, 20 cc urine yesterday    DIALYSIS TREATMENT DATA:  Estimated Dry Weight (kg): 65.5 kg (144 lb 6.4 oz)  Patient Goal Weight (kg): 2 kg (4 lb 6.6 oz)  Dialyzer: F-180 (98 mLs)  Dialysis Bath  Bath: 2 K+ / 2.5 Ca+  Dialysate Na (mEq/L): 137 mEq/L  Dialysate HCO3 (mEq/L): 31 mEq/L  Dialysate Total Buffer HCO3 (mEq/L): 35 mEq/L  Blood Flow Rate (mL/min): 400 mL/min  Dialysis Flow (mL/min): 800 mL/min    PHYSICAL EXAM:  Vitals:  Temp:  [36.6 ??C (97.9 ??F)-36.8 ??C (98.3 ??F)] 36.7 ??C (98 ??F)  Heart Rate:  [71-96] 86  SpO2 Pulse:  [84-97] 84  BP: (147-167)/(90-109) 147/90  MAP (mmHg):  [109-118] 111  Weights:  Pre-Treatment Weight (kg): 69.5 kg (153 lb 3.5 oz)    General: in no acute distress, currently dialyzing in a chair  Pulmonary: clear to auscultation  Cardiovascular: regular rate and rhythm  Extremities: no significant  edema  Access: LUE AV fistula    LAB DATA:  Lab Results   Component Value Date    NA 133 (L) 02/23/2019    K 5.0 02/23/2019    CL 97 (L) 02/23/2019    CO2 24.0 02/23/2019    BUN 52 (H) 02/23/2019    CREATININE 10.50 (H) 02/23/2019    CALCIUM 9.0 02/23/2019    MG 2.4 (H) 02/23/2019    PHOS 7.9 (H) 02/23/2019    ALBUMIN 4.9 02/20/2019      Lab Results   Component Value Date    HCT 28.6 (L) 02/23/2019    WBC 6.6 02/23/2019        ASSESSMENT/PLAN:  End Stage Renal Disease on Intermittent Hemodialysis:  UF goal: 2L as tolerated  Adjust medications for a GFR <10  Avoid nephrotoxic agents     Bone Mineral Metabolism:  Lab Results   Component Value Date    CALCIUM 9.0 02/23/2019    CALCIUM 9.5 02/22/2019    Lab Results   Component Value Date    ALBUMIN 4.9 02/20/2019    ALBUMIN 4.4 03/22/2018      Lab Results   Component Value Date    PHOS 7.9 (H) 02/23/2019    PHOS 7.5 (H) 02/22/2019    Lab Results   Component Value Date    PTH 124.7 (H) 02/20/2019    PTH 655 (H) 11/24/2013      will need to restart PO4 binder.    Anemia:   Lab Results   Component Value Date    HGB 9.5 (L) 02/23/2019    HGB 9.4 (L) 02/22/2019    HGB 10.9 (L) 02/21/2019    Iron Saturation (%)   Date Value Ref Range Status   03/21/2014 15 (L) 20 - 50 % Final      Lab Results   Component Value Date    FERRITIN 1,960 (H) 03/21/2014       Management per primary team    Erie Noe Denu-Ciocca, MD  [facility] Division of Nephrology & Hypertension

## 2019-02-23 NOTE — Unmapped (Signed)
Discharge Summary    Admit date: 02/20/2019    Discharge date and time: 02/23/2019 02/24/19 9:04 AM    Discharge to:  Home    Discharge Service: Surg Transplant [facility])    Discharge Attending Physician: No att. providers found    Discharge  Diagnoses: ESRD    Secondary Diagnosis: Active Problems:    Kidney replaced by transplant      OR Procedures: Left - RENAL ALLOTRANSPLANTATION, IMPLANTATION OF GRAFT; WITHOUT RECIPIENT NEPHRECTOMY  BACKBENCH RECONSTRUCTION CADAVER/LIVING DONOR RENAL ALLOGRAFT PRIOR TO TRANSPLANT; ARTERIAL ANASTOMOSIS EAC 02/20/2019     Ancillary Procedures: no procedures    Discharge Day Services: The patient was seen and examined by the Surgical Oncology team on the day of discharge.  Vital signs and laboratory values were stable and within normal limits.  Surgical wounds were examined.  Discharge plan was discussed, instructions for home care were given, and all questions answered.    Subjective  No acute events overnight. Afebrile with stable vital signs. This morning, patient feels well. He has been tolerating a regular diet without issue and denies abdominal pain, nausea, or vomiting. He is looking forward to going home.     Objective   BP 146/93  - Pulse 90  - Temp 37 ??C (Oral)  - Resp 18  - Ht 175.3 cm (5' 9)  - Wt 72 kg (158 lb 11.7 oz)  - SpO2 99%  - BMI 23.44 kg/m??     General: Sitting up in bed, talkative   HENT:  EOMI, trachea midline, moist mucous membranes  Resp:  Non labored breathing on RA  CV:  Regular rate  Abd:  Nontender, kidney transplant incision closed with staples, no erythema, drain output serosanguinous (pulled - drain site was without erythema or drainage and covered with gauze/tegaderm)  Neuro:  Grossly intact  Psych:  Mood and affect appropriate and congruent      Hospital Course:     Russell Wade is a 33 y.o. male with history of ESRD on HD, Alport's syndrome, renal transplant c/b rejection, and HTN who was admitted to the hospital on 02/20/2019 for kidney transplant. The patient was taken to the OR on 02/20/2019 for a kidney transplant. He tolerated the procedure well, was extubated in the OR, and was taken to the PACU where he received routine postoperative care before being transferred to the surgical stepdown unit for close observation and cardiorespiratory monitoring.     The allograft was evaluated with ultrasonography and found to have resistive indices in the transplant arteries within normal limits, mildly increased velocity in the main renal artery at the level of the anastomosis, and small perinephric fluid collection along the inferior pole of the transplant kidney.       He was initially placed on 1:1 UOP/IVF replacement and then transitioned off of maintenance IV fluids when he tolerated PO intake. He was clinically stable postoperatively and was transferred to the floor on POD#2. He had low UOP and continued to require dialysis during his hospitalization. Foley was d/c'd on POD#3 and he passed his TOV. His perinephric drain, which was placed intraop, had stably low, serosanguinous output and was pulled on POD3.      He otherwise did well - diet was slowly advanced and at the time of discharge he was tolerating a regular diet. At the time of discharge, his pain was controlled with PO pain medication and he had returned to the preoperative ambulatory status. Anti-rejection medication levels were monitored and dosages  adjusted to maintain a therapeutic regimen. The patient was seen and assessed by Physical Therapy and deemed suitable for discharge. He will be discharged home on POD#3 in stable condition without home healthcare and planned outpatient follow up.      Condition at Discharge: Improved  Discharge Medications:      Your Medication List      STOP taking these medications    calcitrioL 0.25 MCG capsule  Commonly known as: ROCALTROL     calcium acetate(phosphat bind) 667 mg capsule  Commonly known as: PHOSLO     ciclopirox 0.77 % cream  Commonly known as: LOPROX     cinacalcet 30 MG tablet  Commonly known as: SENSIPAR     DIALYVITE 100-1 mg Tab  Generic drug: B complex-vitamin C-folic acid     diphenhydrAMINE 25 mg capsule  Commonly known as: BENADRYL     EPOGEN 2,000 unit/mL injection  Generic drug: epoetin alfa     lisinopriL 10 MG tablet  Commonly known as: PRINIVIL,ZESTRIL     oxyCODONE 5 MG immediate release tablet  Commonly known as: ROXICODONE     sevelamer 800 mg tablet  Commonly known as: RENVELA        START taking these medications    aspirin 81 MG tablet  Commonly known as: ECOTRIN  Take 1 tablet (81 mg total) by mouth daily. HOLD until directed to start in clinic.     gabapentin 100 MG capsule  Commonly known as: NEURONTIN  Take 1 capsule (100 mg total) by mouth nightly for 12 days.     magnesium (amino acid chelate) 133 mg Tab  Generic drug: magnesium oxide-Mg AA chelate  Take 1 tablet by mouth Two (2) times a day. HOLD until directed to start by your coordinator.     MYFORTIC 180 MG EC tablet  Generic drug: mycophenolate  Take 3 tablets (540 mg total) by mouth Two (2) times a day.     polyethylene glycol 17 gram packet  Commonly known as: MIRALAX  MIx 1 packet (17 gm) in 8 oz of water, tea, coffee, or juice and drink by mouth daily as needed.     PROGRAF 1 MG capsule  Generic drug: tacrolimus  Take 6 capsules (6 mg total) by mouth two (2) times a day.     sulfamethoxazole-trimethoprim 400-80 mg per tablet  Commonly known as: BACTRIM  Take 1 tablet (80 mg of trimethoprim total) by mouth Every Monday, Wednesday, and Friday.     traMADoL 50 mg tablet  Commonly known as: ULTRAM  Take 1-2 tablets (50-100 mg total) by mouth two (2) times a day as needed for other (pain).     valGANciclovir 450 mg tablet  Commonly known as: VALCYTE  Take 1 tablet (450 mg total) by mouth Every Monday and Thursday.        CHANGE how you take these medications    acetaminophen 500 MG tablet  Commonly known as: TYLENOL  Take 1-2 tablets (500-1,000 mg total) by mouth every six (6) hours as needed for pain or fever (> 38C).  What changed:   ?? medication strength  ?? how much to take  ?? reasons to take this     amLODIPine 5 MG tablet  Commonly known as: NORVASC  Take 2 tablets (10 mg total) by mouth daily.  What changed:   ?? medication strength  ?? how much to take     DOK 100 MG capsule  Generic drug: docusate sodium  Take  1 capsule (100 mg total) by mouth two (2) times a day as needed for constipation.  What changed:   ?? when to take this  ?? reasons to take this        CONTINUE taking these medications    AMBIEN 10 mg tablet  Generic drug: zolpidem  Take 10 mg by mouth nightly as needed for sleep.     hydrOXYzine 25 MG tablet  Commonly known as: ATARAX  Take 25 mg by mouth every eight (8) hours as needed for itching.              Pending test results:    Discharge Instructions:  Activity:     Diet:    Other Instructions:  Other Instructions     Discharge instructions      Activity: Do not lift > 10-15 lbs for 1st 6 weeks, then gradually increase to 25 lbs over the following 6 weeks. Resume heavy lifting only after being cleared to do so at follow-up appointment in Transplant Surgery clinic.    Diet: Renal diet- low potassium, low phosphorus    Your Post-Transplant Coordinator is [employer]. Contact your transplant coordinator or the Transplant Surgery Office [phone]) during business hours or page the transplant coordinator on call [phone]) after business hours for:    - fever >100.5 degrees F by mouth, any fever with shaking chills, or other signs or symptoms of infection   - uncontrolled nausea, vomiting, or diarrhea; inability to have a bowel movement for > 3 days.   - any problem that prevents taking medications as scheduled.   - pain uncontrolled with prescribed medication or new pain or tenderness at the surgical site   - sudden weight gain or increase in blood pressure (greater than 140/85)   - shortness of breath, chest pain / discomfort   - new or increasing jaundice   - urinary symptoms including pain / difficulty / burning or tea-colored urine   - any other new or concerning symptoms   - questions regarding your medications or continuing care      Patient may shower, but should not immerse wounds in bath or pool for 2-3 weeks. Wash the surgical site with mild soap and water, but do not scrub vigorously.    You may dress wounds with dry gauze and tape to avoid soilage.    Do not drive or operate heavy machinery prior to MD clearance, or at any time while taking narcotics.    Inspect surgical sites at least twice daily, contact Transplant Coordinator for spreading redness, purulent discharge, or increasing bleeding or drainage, or for separation of wounds.     Maintain a written record of daily vital signs, per Handbook instructions.     Maintain a written record of medications taken and review against the discharge medications sheet (orange paper). Periodically review your Transplant Handbook for important information regarding postoperative care and required precautions.      Labs and Other Follow-ups after Discharge:   Kidney  Labs 3x week: CBC, BMP, Mg, Phos and Tacrolimus trough  Labs every 3 months: Hepatic function panel    Transplant Coordinator:  Margaretha Glassing- phone: [phone] fax: [phone]             Labs and Other Follow-ups after Discharge:  Follow Up instructions and Outpatient Referrals     Discharge instructions          Future Appointments:  Appointments which have been scheduled for you    Mar 02, 2019  9:00 AM  (Arrive by 8:45 AM)  LAB ONLY with LAB WALK-IN ACC  LAB PHLEB 1ST FL ACC [facility] REGION) [address]  [place] [address]  [phone]      Mar 02, 2019  9:30 AM  (Arrive by 9:15 AM)  RETURN NEPHROLOGY POST with Cordelia Poche, AGNP  [facility] KIDNEY TRANSPLANT ACC [address] [place] [facility] REGION) [address]  [place] [address]  [phone]      Mar 07, 2019  8:00 AM  (Arrive by 7:30 AM)  LAB ONLY with LAB PHLEB GRND UNCW  LAB PHLEB GRND FLR [employer] [facility] REGION) [address]  [place] [address]  [phone]      Mar 07, 2019  8:40 AM  (Arrive by 8:10 AM)  RETURN 20 with Particia Nearing, MD  [facility] TRANSPLANT SURGERY [place] [facility] REGION) [address]  [place] [address]  [phone]      Mar 07, 2019  9:40 AM  (Arrive by 9:10 AM)  Nila Nephew W MD with Fleeta Emmer, CPP  [facility] TRANSPLANT SURGERY [place] [facility] REGION) [address]  [place] [address]  [phone]      Mar 28, 2019  3:00 PM  (Arrive by 2:45 PM)  CYSTO LOCAL with Turner Daniels, MD  [facility] UROLOGY PROCEDURES [facility] REGION) [address]  [place] [address]  [phone]      Mar 29, 2019  9:30 AM  (Arrive by 9:00 AM)  LAB ONLY with LAB PHLEB GRND UNCW  LAB PHLEB GRND FLR [employer] [facility] REGION) [address]  [place] [address]  [phone]      Mar 29, 2019 10:00 AM  (Arrive by 9:30 AM)  RETURN PHARMD with Wallace Cullens Mincemoyer, CPP  [facility] KIDNEY TRANSPLANT [place] [facility] REGION) [address]  [place] [address]  [phone]      Mar 29, 2019 10:30 AM  (Arrive by 10:00 AM)  RETURN  GENERAL with Cordelia Poche, AGNP  [facility] KIDNEY TRANSPLANT [place] [facility] REGION) [address]  [place] [address]  [phone]

## 2019-02-23 NOTE — Unmapped (Signed)
Dialysis Treatment post Renal transplant.  240 min 2 K 2.5 Ca, 35 Bicarb, NA 137, Temp 36  BFR 400, DFR 800, Goal 2 Liters  Continue patient education and vital sign monitoring.

## 2019-02-23 NOTE — Unmapped (Signed)
HEMODIALYSIS NURSE PROCEDURE NOTE       Treatment Number:  2 Room / Station:  8    Procedure Date:  02/23/19 Device Name/Number: harry    Total Dialysis Treatment Time:  241 Min.    CONSENT:    Written consent was obtained prior to the procedure and is detailed in the medical record.  Prior to the start of the procedure, a time out was taken and the identity of the patient was confirmed via name, medical record number and date of birth.     WEIGHT:  Hemodialysis Pre-Treatment Weights     Date/Time Pre-Treatment Weight (kg) Estimated Dry Weight (kg) Patient Goal Weight (kg) Total Goal Weight (kg)    02/23/19 0729  69.5 kg (153 lb 3.5 oz)  65.5 kg (144 lb 6.4 oz)  2 kg (4 lb 6.6 oz)  2.55 kg (5 lb 10 oz)         Hemodialysis Post Treatment Weights     Date/Time Post-Treatment Weight (kg) Treatment Weight Change (kg)    02/23/19 1202  67.1 kg (147 lb 14.9 oz)  -2.41 kg        Active Dialysis Orders (168h ago, onward)     Start     Ordered    02/23/19 0726  Hemodialysis inpatient  Every Mon, Wed, Fri     Comments: No heparin bolus due to recent surgery   Question Answer Comment   K+ 2 meq/L    Ca++ 2.5 meq/L    Bicarb 35 meq/L    Na+ 137 meq/L    Na+ Modeling none    Dialyzer F180NR    Dialysate Temperature (C) 36    BFR-As tolerated to a maximum of: 400 mL/min    DFR 800 mL/min    Duration of treatment 4 Hr    Dry weight (kg) 65.5kg    Challenge dry weight (kg) no    Fluid removal (L) 2L 9/9, UF PROFILE #4    Tubing Adult = 142 ml    Access Site AVF    Access Site Location Left    Keep SBP >: 100        02/23/19 0725              ASSESSMENT:  General appearance: alert  Neurologic: Alert and oriented X 3, normal strength and tone. Normal symmetric reflexes. Normal coordination and gait  Lungs: clear to auscultation bilaterally  Heart: regular rate and rhythm, S1, S2 normal, no murmur, click, rub or gallop and regular rate and rhythm  Abdomen: soft, non-tender, surgical incision r/t renal transplant  Skin: WNL    ACCESS SITE:             Arteriovenous Fistula - Vein Graft  Access 03/22/04 Arteriovenous fistula Left Forearm (Active)   Site Assessment Clean;Dry;Intact 02/23/19 1205   AV Fistula Thrill Present;Bruit Present 02/23/19 1205   Status Deaccessed 02/23/19 1205   Dressing Intervention New dressing 02/23/19 1205   Dressing Status      Clean;Dry 02/23/19 1205   Site Condition No complications 02/23/19 1205   Dressing Gauze 02/23/19 1205   .     Patient Lines/Drains/Airways Status    Active Peripheral & Central Intravenous Access     Name:   Placement date:   Placement time:   Site:   Days:    Peripheral IV 02/20/19 Anterior;Right Forearm   02/20/19    0600    Forearm   3    Peripheral IV 02/20/19  Right Hand   02/20/19    1443    Hand   2               LAB RESULTS:  Lab Results   Component Value Date    NA 133 (L) 02/23/2019    K 5.0 02/23/2019    CL 97 (L) 02/23/2019    CO2 24.0 02/23/2019    BUN 52 (H) 02/23/2019    CREATININE 10.50 (H) 02/23/2019    GLU 112 02/23/2019    GLUF 110 03/23/2014    CALCIUM 9.0 02/23/2019    PHOS 7.9 (H) 02/23/2019    MG 2.4 (H) 02/23/2019    PTH 124.7 (H) 02/20/2019    IRON 17 (L) 03/21/2014    LABIRON 15 (L) 03/21/2014    TRANSFERRIN 87 (L) 03/21/2014    FERRITIN 1,960 (H) 03/21/2014    TIBC 110 (L) 03/21/2014     Lab Results   Component Value Date    WBC 6.6 02/23/2019    HGB 9.5 (L) 02/23/2019    HCT 28.6 (L) 02/23/2019    PLT 130 (L) 02/23/2019    APTT 32.6 02/20/2019    HEPBIGM Nonreactive 03/19/2014        VITAL SIGNS:   Temperature     Date/Time Temp Temp src      02/23/19 1202  36.7 ??C (98 ??F)  ???         Hemodynamics     Date/Time Pulse BP MAP (mmHg) Patient Position    02/23/19 1215  86  147/90  ???  Sitting    02/23/19 1202  80  157/104  ???  Lying    02/23/19 1145  78  167/109  ???  Lying    02/23/19 1130  83  161/105  ???  Lying    02/23/19 1100  95  154/95  ???  Lying    02/23/19 1030  71  157/101  ???  Lying    02/23/19 1000  77  152/99  ???  Lying    02/23/19 0930  85  155/99  ???  Lying 02/23/19 0900  78  155/93  ???  Lying    02/23/19 0830  81  164/107  ???  Sitting    02/23/19 0801  77  159/105  ???  Sitting          Oxygen Therapy     Date/Time Resp SpO2 O2 Device O2 Flow Rate (L/min)    02/23/19 1215  16  ???  ???  ???    02/23/19 1202  16  100 %  None (Room air)  ???    02/23/19 1145  16  ???  None (Room air)  ???    02/23/19 1130  16  ???  None (Room air)  ???    02/23/19 1100  16  ???  None (Room air)  ???    02/23/19 1030  16  ???  None (Room air)  ???    02/23/19 1000  16  ???  None (Room air)  ???    02/23/19 0930  16  ???  None (Room air)  ???    02/23/19 0900  16  ???  None (Room air)  ???    02/23/19 0830  18  ???  None (Room air)  ???    02/23/19 0801  18  ???  None (Room air)  ???          Pre-Hemodialysis Assessment  Date/Time Therapy Number Dialyzer Hemodialysis Line Type All Machine Alarms Passed    02/23/19 0729  2  F-180 (98 mLs)  Adult (142 m/s)  Yes    Date/Time Air Detector Saline Line Double Clampled Hemo-Safe Applied Dialysis Flow (mL/min)    02/23/19 0729  Engaged  ???  ???  800 mL/min    Date/Time Verify Priming Solution Priming Volume Hemodialysis Independent pH Hemodialysis Machine Conductivity (mS/cm)    02/23/19 0729  0.9% NS  250 mL  ??? passed  13.7 mS/cm    Date/Time Hemodialysis Independent Conductivity (mS/cm) Bicarb Conductivity Residual Bleach Negative Total Chlorine    02/23/19 0729  13.8 mS/cm --  Yes  0        Pre-Hemodialysis Treatment Comments     Date/Time Pre-Hemodialysis Comments    02/23/19 0729  Pt Stable, Alert, Feels good, dialyze in chair, Verified Deanna, RN        Hemodialysis Treatment     Date/Time Blood Flow Rate (mL/min) Arterial Pressure (mmHg) Venous Pressure (mmHg) Transmembrane Pressure (mmHg)    02/23/19 1202  400 mL/min  -180 mmHg  163 mmHg  62 mmHg    02/23/19 1145  400 mL/min  -178 mmHg  166 mmHg  58 mmHg    02/23/19 1130  400 mL/min  -175 mmHg  169 mmHg  57 mmHg    02/23/19 1100  400 mL/min  -179 mmHg  166 mmHg  62 mmHg    02/23/19 1030  400 mL/min  -175 mmHg  167 mmHg  57 mmHg 02/23/19 1000  400 mL/min  -163 mmHg  173 mmHg  63 mmHg    02/23/19 0930  400 mL/min  -171 mmHg  157 mmHg  63 mmHg    02/23/19 0900  400 mL/min  -170 mmHg  167 mmHg  51 mmHg    02/23/19 0830  400 mL/min  -174 mmHg  161 mmHg  69 mmHg    02/23/19 0801  400 mL/min  -170 mmHg  170 mmHg  50 mmHg    Date/Time Ultrafiltration Rate (mL/hr) Ultrafiltrate Removed (mL) Dialysate Flow Rate (mL/min) KECN (Kecn)    02/23/19 1202  630 mL/hr  2528 mL  800 ml/min  ???    02/23/19 1145  630 mL/hr  2344 mL  800 ml/min  ???    02/23/19 1130  640 mL/hr  2175 mL  800 ml/min  ???    02/23/19 1100  640 mL/hr  1873 mL  800 ml/min  ???    02/23/19 1030  640 mL/hr  1560 mL  800 ml/min  ???    02/23/19 1000  700 mL/hr  1234 mL  800 ml/min  ???    02/23/19 0930  570 mL/hr  916 mL  800 ml/min  ???    02/23/19 0900  510 mL/hr  579 mL  800 ml/min  ???    02/23/19 0830  830 mL/hr  245 mL  800 ml/min  ???    02/23/19 0801  450 mL/hr  0 mL  800 ml/min  ???        Hemodialysis Treatment Comments     Date/Time Intra-Hemodialysis Comments    02/23/19 1202  pt stable, alert, tx completed, bld rinsed back    02/23/19 1145  pt stable, resting, eyes closed    02/23/19 1130  pt stable, resting, new warm blanket applied    02/23/19 1100  pt stable, alert, talking with MD Ciocca    02/23/19 1030  pt stable, resting  02/23/19 1000  pt stable, resting    02/23/19 0930  pt stable, alert, watching tv    02/23/19 0900  pt stable    02/23/19 0830  pt stable, alert, feels good, watching tv    02/23/19 0801  pt stable, alert, feels good, tx initiated, verified Deanna RN        Post Treatment     Date/Time Rinseback Volume (mL) On Line Clearance: spKt/V Total Liters Processed (L/min) Dialyzer Clearance    02/23/19 1202  300 mL  1.81 spKt/V  89.7 L/min  Moderately streaked        Post Hemodialysis Treatment Comments     Date/Time Post-Hemodialysis Comments    02/23/19 1202  PT stable, alert, oreinted, tx completed, blood returned, verified with Deanna, RN        Hemodialysis I/O Date/Time Total Hemodialysis Replacement Volume (mL) Total Ultrafiltrate Output (mL)    02/23/19 1202  ???  2000 mL          5209-5209-01 - Medicaitons Given During Treatment  (last 5 hrs)         ** No medications to display **

## 2019-02-24 DIAGNOSIS — D509 Iron deficiency anemia, unspecified: Secondary | ICD-10-CM | POA: Diagnosis not present

## 2019-02-24 DIAGNOSIS — N2581 Secondary hyperparathyroidism of renal origin: Secondary | ICD-10-CM | POA: Diagnosis not present

## 2019-02-24 DIAGNOSIS — D631 Anemia in chronic kidney disease: Secondary | ICD-10-CM | POA: Diagnosis not present

## 2019-02-24 DIAGNOSIS — Z992 Dependence on renal dialysis: Secondary | ICD-10-CM | POA: Diagnosis not present

## 2019-02-24 DIAGNOSIS — N186 End stage renal disease: Secondary | ICD-10-CM | POA: Diagnosis not present

## 2019-02-24 DIAGNOSIS — Z23 Encounter for immunization: Secondary | ICD-10-CM | POA: Diagnosis not present

## 2019-02-24 LAB — HLA CLASS 1 ANTIBODY RESULT: Lab: POSITIVE

## 2019-02-24 LAB — FSAB CLASS 2 ANTIBODY SPECIFICITY

## 2019-02-24 LAB — CLASS 2 ANTIBODIES IDENTIFIED

## 2019-02-24 LAB — FSAB CLASS 1 ANTIBODY SPECIFICITY

## 2019-02-24 MED ORDER — VALGANCICLOVIR 450 MG TABLET
ORAL_TABLET | ORAL | 2 refills | 105.00000 days | Status: CP
Start: 2019-02-24 — End: 2019-05-25
  Filled 2019-02-23: qty 30, 30d supply, fill #0
  Filled 2019-03-21: qty 8, 28d supply, fill #0

## 2019-02-24 NOTE — Unmapped (Signed)
[facility] Pharmacist Intervention    Type of intervention: patient enrollment    Medication: all meds    Problem: received notification from inpatient team that patient will fills refills at [facility] post-discharge    Intervention: patient enrolled in specialty calls with welcome/onboarding call set up for next week pending discharge    Follow up needed: see above    Approximate time spent: 10 minutes    [name]   [facility] Specialty Pharmacist

## 2019-02-24 NOTE — Unmapped (Signed)
Review discharge instructions and AVS form with patient. He indicates understanding of teaching. Pt has his belongings and meds from pharmacy. VSS. Urine output diminished. Pt tolerating diet without complaint of nausea or vomiting. Pain controlled with scheduled & PRN pain medications. Pt ambulates without use of assistive device. No pt questions or concerns at this time.     Problem: Adult Inpatient Plan of Care  Goal: Plan of Care Review  Outcome: Resolved  Goal: Patient-Specific Goal (Individualization)  Outcome: Resolved  Goal: Absence of Hospital-Acquired Illness or Injury  Outcome: Resolved  Goal: Optimal Comfort and Wellbeing  Outcome: Resolved  Goal: Readiness for Transition of Care  Outcome: Resolved  Goal: Rounds/Family Conference  Outcome: Resolved     Problem: Self-Care Deficit  Goal: Improved Ability to Complete Activities of Daily Living  Outcome: Resolved     Problem: Wound  Goal: Optimal Wound Healing  Outcome: Resolved     Problem: Hemodynamic Instability (Hemodialysis)  Goal: Vital Signs Remain in Desired Range  Outcome: Resolved     Problem: Fall Injury Risk  Goal: Absence of Fall and Fall-Related Injury  Outcome: Resolved

## 2019-02-25 ENCOUNTER — Other Ambulatory Visit (HOSPITAL_COMMUNITY)
Admission: RE | Admit: 2019-02-25 | Discharge: 2019-02-25 | Disposition: A | Discharge status: Home / Self Care | Payer: Medicare Other | Source: Ambulatory Visit | Attending: Family Medicine | Admitting: Family Medicine

## 2019-02-25 DIAGNOSIS — E1129 Type 2 diabetes mellitus with other diabetic kidney complication: Secondary | ICD-10-CM | POA: Diagnosis not present

## 2019-02-25 DIAGNOSIS — Z940 Kidney transplant status: Secondary | ICD-10-CM | POA: Diagnosis not present

## 2019-02-25 DIAGNOSIS — Z09 Encounter for follow-up examination after completed treatment for conditions other than malignant neoplasm: Secondary | ICD-10-CM | POA: Insufficient documentation

## 2019-02-25 DIAGNOSIS — Z789 Other specified health status: Secondary | ICD-10-CM | POA: Diagnosis not present

## 2019-02-25 DIAGNOSIS — Z114 Encounter for screening for human immunodeficiency virus [HIV]: Secondary | ICD-10-CM | POA: Insufficient documentation

## 2019-02-25 DIAGNOSIS — Z9483 Pancreas transplant status: Secondary | ICD-10-CM | POA: Diagnosis not present

## 2019-02-25 DIAGNOSIS — E559 Vitamin D deficiency, unspecified: Secondary | ICD-10-CM | POA: Diagnosis not present

## 2019-02-25 DIAGNOSIS — D899 Disorder involving the immune mechanism, unspecified: Secondary | ICD-10-CM | POA: Insufficient documentation

## 2019-02-25 DIAGNOSIS — N390 Urinary tract infection, site not specified: Secondary | ICD-10-CM | POA: Diagnosis not present

## 2019-02-25 DIAGNOSIS — N189 Chronic kidney disease, unspecified: Secondary | ICD-10-CM | POA: Diagnosis not present

## 2019-02-25 DIAGNOSIS — B259 Cytomegaloviral disease, unspecified: Secondary | ICD-10-CM | POA: Diagnosis not present

## 2019-02-25 DIAGNOSIS — D631 Anemia in chronic kidney disease: Secondary | ICD-10-CM | POA: Insufficient documentation

## 2019-02-25 DIAGNOSIS — Z79899 Other long term (current) drug therapy: Secondary | ICD-10-CM | POA: Insufficient documentation

## 2019-02-25 LAB — CBC WITH DIFFERENTIAL/PLATELET
Abs Immature Granulocytes: 0.04 10*3/uL (ref 0.00–0.07)
Basophils Absolute: 0 10*3/uL (ref 0.0–0.1)
Basophils Relative: 0 %
Eosinophils Absolute: 0 10*3/uL (ref 0.0–0.5)
Eosinophils Relative: 0 %
HCT: 31.7 % — ABNORMAL LOW (ref 39.0–52.0)
Hemoglobin: 10.8 g/dL — ABNORMAL LOW (ref 13.0–17.0)
Immature Granulocytes: 2 %
Lymphocytes Relative: 0 %
Lymphs Abs: 0 10*3/uL — ABNORMAL LOW (ref 0.7–4.0)
MCH: 27.2 pg (ref 26.0–34.0)
MCHC: 34.1 g/dL (ref 30.0–36.0)
MCV: 79.8 fL — ABNORMAL LOW (ref 80.0–100.0)
Monocytes Absolute: 0.1 10*3/uL (ref 0.1–1.0)
Monocytes Relative: 5 %
Neutro Abs: 2.5 10*3/uL (ref 1.7–7.7)
Neutrophils Relative %: 93 %
Platelets: 119 10*3/uL — ABNORMAL LOW (ref 150–400)
RBC: 3.97 MIL/uL — ABNORMAL LOW (ref 4.22–5.81)
RDW: 15.4 % (ref 11.5–15.5)
WBC: 2.7 10*3/uL — ABNORMAL LOW (ref 4.0–10.5)
nRBC: 0 % (ref 0.0–0.2)

## 2019-02-25 LAB — BASIC METABOLIC PANEL
Anion gap: 15 (ref 5–15)
BUN: 28 mg/dL — ABNORMAL HIGH (ref 6–20)
CO2: 25 mmol/L (ref 22–32)
Calcium: 9.4 mg/dL (ref 8.9–10.3)
Chloride: 91 mmol/L — ABNORMAL LOW (ref 98–111)
Creatinine, Ser: 7.23 mg/dL — ABNORMAL HIGH (ref 0.61–1.24)
GFR calc Af Amer: 10 mL/min — ABNORMAL LOW (ref >60)
GFR calc non Af Amer: 9 mL/min — ABNORMAL LOW (ref >60)
Glucose, Bld: 90 mg/dL (ref 70–99)
Potassium: 3.4 mmol/L — ABNORMAL LOW (ref 3.5–5.1)
Sodium: 131 mmol/L — ABNORMAL LOW (ref 135–145)

## 2019-02-25 LAB — MAGNESIUM: Magnesium: 2 mg/dL (ref 1.7–2.4)

## 2019-02-25 LAB — PHOSPHORUS: Phosphorus: 6.1 mg/dL — ABNORMAL HIGH (ref 2.5–4.6)

## 2019-02-25 NOTE — Unmapped (Signed)
[facility]ialysis Discharge Summary    [facility] Dialysis Unit  Telephone: [phone]    Outpatient Dialysis Unit: [facility]  Date of Hospital Discharge: 02/23/2019  Date of Last Hospital Dialysis Treatment: 02/23/2019    In-Hospital Dialysis  Patient underwent deceased donor kidney transplant on 02/20/2019. Urine output: 3 mL (9/6 s/p transplant), 9 mL (9/7), 20 mL (9/8). Please continue to monitor urine output.  Target weight: EDW 65.5 kg. Post-weight 67.1 kg.  Bone mineral disease management: Ca 9.0 and Phos 7.9. No vitamin D analogues given.  Anemia management: Hgb 9.5. No ESAs given.  Blood Transfusions: None.  Access: Left AVF  Antibiotics (indication, type, dose, expected course): None.    Brief Summary of Hospitalization:  Russell Wade is a 33 y.o. male with history of ESRD on HD, Alport's syndrome, renal transplant c/b rejection, and HTN who was admitted to the hospital on 02/20/2019 for kidney transplant. The patient was taken to the OR on 02/20/2019 for a kidney transplant. He tolerated the procedure well, was extubated in the OR, and was taken to the PACU where he received routine postoperative care before being transferred to the surgical stepdown unit for close observation and cardiorespiratory monitoring.   ??  The allograft was evaluated with ultrasonography and found to have resistive indices in the transplant arteries within normal limits, mildly increased velocity in the main renal artery at the level of the anastomosis, and small perinephric fluid collection along the inferior pole of the transplant kidney.    ??  He was initially placed on 1:1 UOP/IVF replacement and then transitioned off of maintenance IV fluids when he tolerated PO intake. He was clinically stable postoperatively and was transferred to the floor on POD#2. He had low UOP and continued to require dialysis during his hospitalization. Foley was d/c'd on POD#3 and he passed his TOV. His perinephric drain, which was placed intraop, had stably low, serosanguinous output and was pulled on POD3.   ??  He otherwise did well - diet was slowly advanced and at the time of discharge he was tolerating a regular diet. At the time of discharge, his pain was controlled with PO pain medication and he had returned to the preoperative ambulatory status. Anti-rejection medication levels were monitored and dosages adjusted to maintain a therapeutic regimen. The patient was seen and assessed by Physical Therapy and deemed suitable for discharge. He will be discharged home on POD#3 in stable condition without home healthcare and planned outpatient follow up.    Please do not hesitate to contact Quin Hoop, NP, [facility] Inpatient Dialysis Nurse Practitioner [phone]), or the [facility] Inpatient Dialysis Unit [phone]) with any questions or concerns.

## 2019-02-26 DIAGNOSIS — Z992 Dependence on renal dialysis: Secondary | ICD-10-CM | POA: Diagnosis not present

## 2019-02-26 DIAGNOSIS — D631 Anemia in chronic kidney disease: Secondary | ICD-10-CM | POA: Diagnosis not present

## 2019-02-26 DIAGNOSIS — N2581 Secondary hyperparathyroidism of renal origin: Secondary | ICD-10-CM | POA: Diagnosis not present

## 2019-02-26 DIAGNOSIS — Z23 Encounter for immunization: Secondary | ICD-10-CM | POA: Diagnosis not present

## 2019-02-26 DIAGNOSIS — D509 Iron deficiency anemia, unspecified: Secondary | ICD-10-CM | POA: Diagnosis not present

## 2019-02-26 DIAGNOSIS — N186 End stage renal disease: Secondary | ICD-10-CM | POA: Diagnosis not present

## 2019-02-27 LAB — TACROLIMUS LEVEL: Tacrolimus (FK506) - LabCorp: 1.8 ng/mL — ABNORMAL LOW (ref 2.0–20.0)

## 2019-02-28 ENCOUNTER — Other Ambulatory Visit (HOSPITAL_COMMUNITY)
Admission: RE | Admit: 2019-02-28 | Discharge: 2019-02-28 | Disposition: A | Discharge status: Home / Self Care | Payer: Medicare Other | Source: Ambulatory Visit | Attending: Family Medicine | Admitting: Family Medicine

## 2019-02-28 DIAGNOSIS — Z940 Kidney transplant status: Secondary | ICD-10-CM

## 2019-02-28 DIAGNOSIS — Z114 Encounter for screening for human immunodeficiency virus [HIV]: Secondary | ICD-10-CM | POA: Insufficient documentation

## 2019-02-28 DIAGNOSIS — D899 Disorder involving the immune mechanism, unspecified: Secondary | ICD-10-CM | POA: Diagnosis not present

## 2019-02-28 DIAGNOSIS — N390 Urinary tract infection, site not specified: Secondary | ICD-10-CM | POA: Insufficient documentation

## 2019-02-28 DIAGNOSIS — Z9483 Pancreas transplant status: Secondary | ICD-10-CM | POA: Insufficient documentation

## 2019-02-28 DIAGNOSIS — Z789 Other specified health status: Secondary | ICD-10-CM | POA: Insufficient documentation

## 2019-02-28 DIAGNOSIS — B259 Cytomegaloviral disease, unspecified: Secondary | ICD-10-CM | POA: Diagnosis not present

## 2019-02-28 DIAGNOSIS — E559 Vitamin D deficiency, unspecified: Secondary | ICD-10-CM | POA: Diagnosis not present

## 2019-02-28 DIAGNOSIS — T8610 Unspecified complication of kidney transplant: Secondary | ICD-10-CM | POA: Insufficient documentation

## 2019-02-28 DIAGNOSIS — D631 Anemia in chronic kidney disease: Secondary | ICD-10-CM | POA: Insufficient documentation

## 2019-02-28 DIAGNOSIS — N189 Chronic kidney disease, unspecified: Secondary | ICD-10-CM | POA: Insufficient documentation

## 2019-02-28 DIAGNOSIS — Z79899 Other long term (current) drug therapy: Secondary | ICD-10-CM | POA: Insufficient documentation

## 2019-02-28 DIAGNOSIS — E1129 Type 2 diabetes mellitus with other diabetic kidney complication: Secondary | ICD-10-CM | POA: Diagnosis not present

## 2019-02-28 DIAGNOSIS — Z09 Encounter for follow-up examination after completed treatment for conditions other than malignant neoplasm: Secondary | ICD-10-CM | POA: Insufficient documentation

## 2019-02-28 LAB — BASIC METABOLIC PANEL
Anion gap: 14 (ref 5–15)
BUN: 35 mg/dL — ABNORMAL HIGH (ref 6–20)
CALCIUM: 9.4 mg/dL
CO2: 25 mmol/L
CO2: 27 mmol/L (ref 22–32)
CREATININE: 7.23 mg/dL — ABNORMAL HIGH
Calcium: 9.3 mg/dL (ref 8.9–10.3)
Chloride: 95 mmol/L — ABNORMAL LOW (ref 98–111)
Creatinine, Ser: 10.59 mg/dL — ABNORMAL HIGH (ref 0.61–1.24)
EGFR CKD-EPI AA MALE: 10 mL/min/{1.73_m2} — ABNORMAL LOW
GFR calc Af Amer: 7 mL/min — ABNORMAL LOW (ref >60)
GFR calc non Af Amer: 6 mL/min — ABNORMAL LOW (ref >60)
GLUCOSE RANDOM: 90 mg/dL
Glucose, Bld: 106 mg/dL — ABNORMAL HIGH (ref 70–99)
POTASSIUM: 3.4 mmol/L — ABNORMAL LOW
Potassium: 3.4 mmol/L — ABNORMAL LOW (ref 3.5–5.1)
SODIUM: 131 mmol/L — ABNORMAL LOW
Sodium: 136 mmol/L (ref 135–145)

## 2019-02-28 LAB — COMPREHENSIVE METABOLIC PANEL
BLOOD UREA NITROGEN: 35 mg/dL — ABNORMAL HIGH
CALCIUM: 9.3 mg/dL
CHLORIDE: 95 mmol/L — ABNORMAL LOW
CO2: 27 mmol/L
CREATININE: 10.59 mg/dL — ABNORMAL HIGH
EGFR CKD-EPI AA MALE: 7 mL/min/{1.73_m2} — ABNORMAL LOW
EGFR CKD-EPI NON-AA MALE: 6 mL/min/{1.73_m2} — ABNORMAL LOW
GLUCOSE RANDOM: 106 mg/dL — ABNORMAL HIGH
POTASSIUM: 3.4 mmol/L — ABNORMAL LOW
SODIUM: 136 mmol/L

## 2019-02-28 LAB — CBC W/ DIFFERENTIAL
BASOPHILS ABSOLUTE COUNT: 0 10*9/L
BASOPHILS RELATIVE PERCENT: 0 %
EOSINOPHILS ABSOLUTE COUNT: 0 10*9/L
HEMATOCRIT: 28.3 % — ABNORMAL LOW
HEMOGLOBIN: 9.3 g/dL — ABNORMAL LOW
LYMPHOCYTES ABSOLUTE COUNT: 0 10*9/L — ABNORMAL LOW
LYMPHOCYTES RELATIVE PERCENT: 0 %
MEAN CORPUSCULAR HEMOGLOBIN: 27.3 pg
MEAN CORPUSCULAR VOLUME: 83 fL
MONOCYTES ABSOLUTE COUNT: 0.2 10*9/L
MONOCYTES RELATIVE PERCENT: 6 %
NEUTROPHILS ABSOLUTE COUNT: 3.8 10*9/L
NEUTROPHILS RELATIVE PERCENT: 92 %
PLATELET COUNT: 155 10*9/L
RED BLOOD CELL COUNT: 3.41 10*12/L — ABNORMAL LOW
RED CELL DISTRIBUTION WIDTH: 15.7 % — ABNORMAL HIGH
WBC ADJUSTED: 4.1 10*9/L

## 2019-02-28 LAB — MAGNESIUM
Lab: 2
Lab: 2.1
Magnesium: 2.1 mg/dL (ref 1.7–2.4)

## 2019-02-28 LAB — TACROLIMUS, TROUGH: Lab: 1.8 — ABNORMAL LOW

## 2019-02-28 LAB — PHOSPHORUS
Lab: 6.1 — ABNORMAL HIGH
Lab: 6.8 — ABNORMAL HIGH
Phosphorus: 6.8 mg/dL — ABNORMAL HIGH (ref 2.5–4.6)

## 2019-02-28 LAB — BLOOD UREA NITROGEN: Lab: 35 — ABNORMAL HIGH

## 2019-02-28 LAB — TACROLIMUS LEVEL, TROUGH: TACROLIMUS, TROUGH: 1.8 ng/mL — ABNORMAL LOW

## 2019-02-28 LAB — RED CELL DISTRIBUTION WIDTH: Lab: 15.7 — ABNORMAL HIGH

## 2019-02-28 LAB — EGFR CKD-EPI NON-AA FEMALE: Lab: 0

## 2019-02-28 LAB — CBC WITH DIFFERENTIAL/PLATELET
Abs Immature Granulocytes: 0.04 10*3/uL (ref 0.00–0.07)
Basophils Absolute: 0 10*3/uL (ref 0.0–0.1)
Basophils Relative: 0 %
Eosinophils Absolute: 0 10*3/uL (ref 0.0–0.5)
Eosinophils Relative: 1 %
HCT: 28.3 % — ABNORMAL LOW (ref 39.0–52.0)
Hemoglobin: 9.3 g/dL — ABNORMAL LOW (ref 13.0–17.0)
Immature Granulocytes: 1 %
Lymphocytes Relative: 0 %
Lymphs Abs: 0 10*3/uL — ABNORMAL LOW (ref 0.7–4.0)
MCH: 27.3 pg (ref 26.0–34.0)
MCHC: 32.9 g/dL (ref 30.0–36.0)
MCV: 83 fL (ref 80.0–100.0)
Monocytes Absolute: 0.2 10*3/uL (ref 0.1–1.0)
Monocytes Relative: 6 %
Neutro Abs: 3.8 10*3/uL (ref 1.7–7.7)
Neutrophils Relative %: 92 %
Platelets: 155 10*3/uL (ref 150–400)
RBC: 3.41 MIL/uL — ABNORMAL LOW (ref 4.22–5.81)
RDW: 15.7 % — ABNORMAL HIGH (ref 11.5–15.5)
WBC: 4.1 10*3/uL (ref 4.0–10.5)
nRBC: 0 % (ref 0.0–0.2)

## 2019-02-28 MED ORDER — PROGRAF 1 MG CAPSULE
ORAL_CAPSULE | Freq: Two times a day (BID) | ORAL | 11 refills | 30 days | Status: CP
Start: 2019-02-28 — End: 2019-03-01

## 2019-02-28 NOTE — Unmapped (Signed)
Change in Prograf dosage increase. Co-pay $0.00. Last filled on Part B 02/23/2019

## 2019-02-28 NOTE — Unmapped (Signed)
Prograf 1.8; Pt advised to decrease prograf to 7 mg BID

## 2019-03-01 DIAGNOSIS — Z940 Kidney transplant status: Secondary | ICD-10-CM

## 2019-03-01 DIAGNOSIS — Z0000 Encounter for general adult medical examination without abnormal findings: Secondary | ICD-10-CM

## 2019-03-01 DIAGNOSIS — E559 Vitamin D deficiency, unspecified: Secondary | ICD-10-CM

## 2019-03-01 DIAGNOSIS — Z23 Encounter for immunization: Secondary | ICD-10-CM | POA: Diagnosis not present

## 2019-03-01 DIAGNOSIS — N2581 Secondary hyperparathyroidism of renal origin: Secondary | ICD-10-CM | POA: Diagnosis not present

## 2019-03-01 DIAGNOSIS — D509 Iron deficiency anemia, unspecified: Secondary | ICD-10-CM | POA: Diagnosis not present

## 2019-03-01 DIAGNOSIS — Z992 Dependence on renal dialysis: Secondary | ICD-10-CM | POA: Diagnosis not present

## 2019-03-01 DIAGNOSIS — N186 End stage renal disease: Secondary | ICD-10-CM | POA: Diagnosis not present

## 2019-03-01 DIAGNOSIS — D631 Anemia in chronic kidney disease: Secondary | ICD-10-CM | POA: Diagnosis not present

## 2019-03-01 LAB — CBC W/ DIFFERENTIAL
BASOPHILS ABSOLUTE COUNT: 0 10*9/L
BASOPHILS RELATIVE PERCENT: 0 %
EOSINOPHILS ABSOLUTE COUNT: 0 10*9/L
EOSINOPHILS RELATIVE PERCENT: 0 %
HEMATOCRIT: 31.7 % — ABNORMAL LOW
HEMOGLOBIN: 10.8 g/dL — ABNORMAL LOW
LYMPHOCYTES ABSOLUTE COUNT: 0 10*9/L — ABNORMAL LOW
LYMPHOCYTES RELATIVE PERCENT: 0 %
MEAN CORPUSCULAR HEMOGLOBIN CONC: 34.1 g/dL
MEAN CORPUSCULAR VOLUME: 79.8 fL — ABNORMAL LOW
MONOCYTES ABSOLUTE COUNT: 0.1 10*9/L
NEUTROPHILS ABSOLUTE COUNT: 2.5 10*9/L
NEUTROPHILS RELATIVE PERCENT: 93 %
NUCLEATED RED BLOOD CELLS: 0 /100{WBCs}
PLATELET COUNT: 119 10*9/L — ABNORMAL LOW
RED BLOOD CELL COUNT: 3.97 10*12/L — ABNORMAL LOW
RED CELL DISTRIBUTION WIDTH: 15.4 %
WHITE BLOOD CELL COUNT: 2.7 10*9/L — ABNORMAL LOW

## 2019-03-01 LAB — ANISOCYTOSIS: Lab: 0

## 2019-03-01 MED ORDER — PROGRAF 1 MG CAPSULE
ORAL_CAPSULE | Freq: Two times a day (BID) | ORAL | 11 refills | 30 days | Status: CP
Start: 2019-03-01 — End: 2019-03-02

## 2019-03-01 NOTE — Unmapped (Signed)
[facility]   Patient Onboarding/Medication Counseling    [name] is a 33 y.o. male with kidney transplant who I am counseling today on continuation of therapy.  I am speaking to the patient.    Verified patient's date of birth / HIPAA.    Specialty medication(s) to be sent: see previous note      Non-specialty medications/supplies to be sent: n/a      Medications not needed at this time: n/a         Myfortic (mycophenolic acid)    Medication & Administration     Dosage:   ? Take 3 tablets (540mg ) by mouth twice daily    Administration:   ? Take with or without food, although taking with food helps minimize GI side effects.  ? Swallow the pills whole, do not chew or crush    Adherence/Missed dose instructions:  ? Take a missed dose as soon as you think about it.  ? If it is less than 2 hours until your next dose, skip the missed dose and go back to your normal time.  ? Do not take 2 doses at the same time or extra doses.    Goals of Therapy     ? To prevent organ rejection    Side Effects & Monitoring Parameters     ? Common side effects  ? Back or joint pain  ? Constipation  ? Headache/dizziness  ? Not hungry  ? Stomach pain, diarrhea, constipation, gas, upset stomach, vomiting, nausea  ? Feeling tired or weak  ? Shakiness  ? Trouble sleeping  ? Increased risk of infection    ? The following side effects should be reported to the provider:  ? Allergic reaction  ? High blood sugar (confusion, feeling sleepy, more thirst, more hungry, passing urine more often, flushing, fast breathing, or breath that smells like fruit)  ? Electrolyte issues (mood changes, confusion, muscle pain or weakness, a heartbeat that does not feel normal, seizures, not hungry, or very bad upset stomach or throwing up)  ? High or low blood pressure (bad headache or dizziness, passing out, or change in eyesight)  ? Kidney issues (unable to pass urine, change in how much urine is passed, blood in the urine, or a big weight gain)  ? Skin (oozing, heat, swelling, redness, or pain), UTI and other infections   ? Chest pain or pressure  ? Abnormal heartbeat  ? Unexplained bleeding or bruising  ? Abnormal burning, numbness, or tingling  ? Muscle cramps,  ? Yellowing of skin or eyes    ? Monitoring parameters  ? Pregnancy test initially prior to treatment and 8-10 days later then as needed)  ? CBC weekly for first month then twice monthly for next 2 months, then monthly)  ? Monitor Renal and liver functions  ? Signs of organ rejection    Contraindications, Warnings, & Precautions     ? *This is a REMS drug and an FDA-approved patient medication guide will be printed with each dispensation  ? Black Box Warning: Infections   ? Black Box Warning: Lymphoproliferative disorders - risk of development of lymphoma and skin malignancy is increased  ? Black Box Warning: Use during pregnancy is associated with increased risks of first trimester pregnancy loss and congenital malformations.   ? Black Box Warning: Females of reproductive potential should use contraception during treatment and for 6 weeks after therapy is discontinued  ? CNS depression  ? New or reactivated viral  infections  ? Neutropenia  ? Male patients and/or their male partners should use effective contraception during treatment of the male patient and for at least 3 months after last dose.  ? Breastfeeding is not recommended during therapy and for 6 weeks after last dose    Drug/Food Interactions     ? Medication list reviewed in Epic. The patient was instructed to inform the care team before taking any new medications or supplements. no interactions noted that clinic is not already monitoring.   ? Do not take Echinacea while on this medication  ? Check with your doctor before getting any vaccinations (live or inactivated)    Storage, Handling Precautions, & Disposal     ? Store at room temperature  ? Keep away from children and pets  ? This drug is considered hazardous and should be handled as little as possible.  Wash hands before and after touching pills. If someone else helps with medication administration, they should wear gloves.      Current Medications (including OTC/herbals), Comorbidities and Allergies     Current Outpatient Medications   Medication Sig Dispense Refill   ??? acetaminophen (TYLENOL) 500 MG tablet Take 1-2 tablets (500-1,000 mg total) by mouth every six (6) hours as needed for pain or fever (> 38C). 100 tablet 0   ??? amLODIPine (NORVASC) 5 MG tablet Take 2 tablets (10 mg total) by mouth daily. 60 tablet 11   ??? aspirin (ECOTRIN) 81 MG tablet Take 1 tablet (81 mg total) by mouth daily. HOLD until directed to start in clinic. 30 tablet 11   ??? docusate sodium (COLACE) 100 MG capsule Take 1 capsule (100 mg total) by mouth two (2) times a day as needed for constipation. 60 capsule 0   ??? gabapentin (NEURONTIN) 100 MG capsule Take 1 capsule (100 mg total) by mouth nightly for 12 days. 12 capsule 0   ??? hydrOXYzine (ATARAX) 25 MG tablet Take 25 mg by mouth every eight (8) hours as needed for itching.     ??? magnesium oxide-Mg AA chelate (MAGNESIUM, AMINO ACID CHELATE,) 133 mg Tab Take 1 tablet by mouth Two (2) times a day. HOLD until directed to start by your coordinator. 100 tablet 11   ??? MYFORTIC 180 mg EC tablet Take 3 tablets (540 mg total) by mouth Two (2) times a day. 180 tablet 11   ??? polyethylene glycol (MIRALAX) 17 gram packet MIx 1 packet (17 gm) in 8 oz of water, tea, coffee, or juice and drink by mouth daily as needed. 30 each 0   ??? PROGRAF 1 mg capsule Take 7 capsules (7 mg total) by mouth two (2) times a day. 420 capsule 11   ??? sulfamethoxazole-trimethoprim (BACTRIM) 400-80 mg per tablet Take 1 tablet (80 mg of trimethoprim total) by mouth Every Monday, Wednesday, and Friday. 12 tablet 5   ??? traMADoL (ULTRAM) 50 mg tablet Take 1-2 tablets (50-100 mg total) by mouth two (2) times a day as needed for other (pain). 30 tablet 0   ??? valGANciclovir (VALCYTE) 450 mg tablet Take 1 tablet (450 mg total) by mouth Every Monday and Thursday. 30 tablet 2   ??? zolpidem (AMBIEN) 10 mg tablet Take 10 mg by mouth nightly as needed for sleep.       No current facility-administered medications for this visit.        No Known Allergies    Patient Active Problem List   Diagnosis   ??? Complication of transplanted kidney   ???  Alport syndrome   ??? Esophageal reflux   ??? Essential (primary) hypertension   ??? H/O kidney transplant   ??? Pityriasis versicolor   ??? Acute renal failure (CMS-HCC)   ??? Anemia   ??? Transplant rejection   ??? Hyperkalemia   ??? Kidney transplant rejection   ??? Acute dyspnea   ??? ESRD on dialysis (CMS-HCC)   ??? Secondary hyperparathyroidism of renal origin (CMS-HCC)   ??? Kidney replaced by transplant       Reviewed and up to date in Epic.    Appropriateness of Therapy     Is medication and dose appropriate based on diagnosis? Yes    Baseline Quality of Life Assessment      How many days over the past month did your transplant keep you from your normal activities? new transplant so no days normal but patients states doing ok    Financial Information     Medication Assistance provided: None Required    Anticipated copay of $0 reviewed with patient. Verified delivery address.    Delivery Information     Scheduled delivery date: n/a- pt wants call back in 2 weeks    Expected start date: pt is already taking    Medication will be delivered via n/a to the n/a address in Epic [place].  This shipment will require a signature.      Explained the services we provide at [facility] and that each month we would call to set up refills.  Stressed importance of returning phone calls so that we could ensure they receive their medications in time each month.  Informed patient that we should be setting up refills 7-10 days prior to when they will run out of medication.  A pharmacist will reach out to perform a clinical assessment periodically.  Informed patient that a welcome packet and a drug information handout will be sent.      Patient verbalized understanding of the above information as well as how to contact the pharmacy at [phone] option 4 with any questions/concerns.  The pharmacy is open Monday through Friday 8:30am-4:30pm.  A pharmacist is available 24/7 via pager to answer any clinical questions they may have.    Patient Specific Needs     ? Does the patient have any physical, cognitive, or cultural barriers? No    ? Patient prefers to have medications discussed with  Patient     ? Is the patient able to read and understand education materials at a high school level or above? Yes    ? Patient's primary language is  English     ? Is the patient high risk? Yes, patient taking a REMS drug     ? Does the patient require a Care Management Plan? No     ? Does the patient require physician intervention or other additional services (i.e. nutrition, smoking cessation, social work)? No      [name]  [facility] Specialty Pharmacist

## 2019-03-01 NOTE — Unmapped (Signed)
[facility] Specialty Medication Referral: No PA required    Medication (Brand/Generic): Myfortic 180 mg tablets    Initial Benefits Investigation Claim completed with resulted information below:  No PA required  Patient ABLE to fill at [facility]  Insurance Company:  Part B and Uncertain Medicaid  Anticipated Copay: $0.00    As Co-pay is under $25 defined limit, per policy there will be no further investigation of need for financial assistance at this time unless patient requests. This referral has been communicated to the provider and handed off to the [facility] team for further processing and filling of prescribed medication.   ______________________________________________________________________  Please utilize this referral for viewing purposes as it will serve as the central location for all relevant documentation and updates.

## 2019-03-01 NOTE — Unmapped (Signed)
NEW Valganciclovir prescription. Refill too soon until 03/18/2019. Will attempt test claim on 03/18/2019 date.

## 2019-03-01 NOTE — Unmapped (Signed)
Transplant Nephrology Clinic Visit    History of Present Illness    33 y.o. male here for follow up after kidney transplantation.  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  Patient received a deceased donor kidney transplant on 11/12/2007.  Patient had late cellular rejection in 2011.  He had a transplant nephrectomy on 03/20/2014  Restarted hemodialysis on December 17, 2013.     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 He had delayed graft function after transplant. On POD 1, patient started on dialysis due to low urine output. He received 2 sessions inpatient. Patient discharged on 02/23/19 and continued dialysis at [facility] on TTS 1st shift schedule.       Transplant History:    Organ Received: Left DDT, DCD, KDPI: 36%; 16 hrs cold ischemia time  Native Kidney Disease: Alport's Syndrome  CPRA: 87  Date of Transplant: 02/20/19  Post-Transplant Course: DGF, Dialysis  Prior Transplants: Yes  Induction: [place]  Date of Ureteral Stent Removal: 03/28/2019  Current Immunosuppression: Prograf/Myfrotic  CMV/EBV Status: CMV D+/R+  EBV D+/R+  Valcyte 450 mg twice a week  Rejection Episodes: None  Donor Specific Antibodies: None  Results of Renal Imaging (pre and post):   Pre-Transplant 03/22/18  Small echogenic kidneys bilaterally, consistent with medical renal disease. 2 subcentimeter left renal cysts measuring up to 0.6 x 0.6 x 0.7 cm. No solid masses or calculi. No hydronephrosis    Post-Transplant 02/20/19 (day of txp)  -Resistive indices in the left lower quadrant renal transplant arteries within normal limits.??  -Mildly increase in velocities noted within the main renal artery at the level of the anastomosis, likely postsurgical. Attention on follow-up.??  -Small perinephric fluid collection noted along the inferior pole transplant kidney measuring 2.3 x 3.2 x 0.8 cm as well as small amount of free fluid seen in the left upper quadrant, likely postoperative.      Current Immunosuppression Regimen:   Prograf 7 mg BID  Myfortic 540 mg BID      Subjective/Interval:     Since being discharged, patient has continued to go to dialysis on TTS schedule.  His dry weight at dialysis has been 66-67 kg.  He has no complaints today except for some itching that occurs.  Will refill his prescription at his request.  His BP at home are 130-140's systolic.  He denies headaches, dizziness, lightheadedness.  He is drinking three 16.9 oz bottles of water and has started making 120 mL of urine a day that is blood tinged but becoming clearer. He denies edema, chest pain, shortness of breath.  He has some soreness with movement due to surgery.     Last dose of Prograf: 9:00pm     Review of Systems    Otherwise as per HPI, all other systems reviewed and are negative.    Past Medical History  1. Kidney Transplant #1: Left DDKT; DBD, SCD, PHS increased risk; 26.5 hours cold ischemia time  ?? Medication non-compliance  ?? CMV Viremia  ?? DSA to DR51  ?? 11/23/09 Biopsy - Acute Rejection; treated with Thymo x 5 days, Solumedrol 500mg  x 3 days; Pred taper then 10mg  x 30 days  ?? 12/28/09 Biopsy with bleed - acute cellular rejection, less severe than previous with patchy lymphocytic infiltrate, some ATN, no endarteritis, no glomerulitis; Solumedrol 500mg  x 3 days; Pred taper  ?? 01/22/10 Biopsy - Smoldering cellular rejection; Solumedrol 500mg  x 3 days  ??  03/07/11 Biopsy - mild interstitial fibrosis and tubular atrophy  ?? 01/10/13 Biopsy - severe cellular rejection and antibody mediated rejection; Thymo x 10 days, Solumedrol 500mg  x 3 days, Plex x 6, IVIG (2 high, 1 low) x 3, Rituximab x 1  ?? 02/16/13 Biopsy - Acute cellular without antibody mediated rejection; Thymo x 10 and Solumedrol x 3  ?? 03/23/13 Biopsy - Less inflammation; smoldering rejection  ?? Dialysis started 11/24/13  ?? Transplant Nephrectomy 03/20/14  2. Hypertension  3. Alport's syndrome  4. S/p parathyroidectomy. Medications  Current Outpatient Medications   Medication Sig Dispense Refill   ??? acetaminophen (TYLENOL) 500 MG tablet Take 1-2 tablets (500-1,000 mg total) by mouth every six (6) hours as needed for pain or fever (> 38C). 100 tablet 0   ??? amLODIPine (NORVASC) 5 MG tablet Take 2 tablets (10 mg total) by mouth daily. 60 tablet 11   ??? aspirin (ECOTRIN) 81 MG tablet Take 1 tablet (81 mg total) by mouth daily. HOLD until directed to start in clinic. 30 tablet 11   ??? docusate sodium (COLACE) 100 MG capsule Take 1 capsule (100 mg total) by mouth two (2) times a day as needed for constipation. 60 capsule 0   ??? gabapentin (NEURONTIN) 100 MG capsule Take 1 capsule (100 mg total) by mouth nightly for 12 days. 12 capsule 0   ??? hydrOXYzine (ATARAX) 25 MG tablet Take 25 mg by mouth every eight (8) hours as needed for itching.     ??? magnesium oxide-Mg AA chelate (MAGNESIUM, AMINO ACID CHELATE,) 133 mg Tab Take 1 tablet by mouth Two (2) times a day. HOLD until directed to start by your coordinator. 100 tablet 11   ??? MYFORTIC 180 mg EC tablet Take 3 tablets (540 mg total) by mouth Two (2) times a day. 180 tablet 11   ??? polyethylene glycol (MIRALAX) 17 gram packet MIx 1 packet (17 gm) in 8 oz of water, tea, coffee, or juice and drink by mouth daily as needed. 30 each 0   ??? PROGRAF 1 mg capsule Take 8 capsules (8 mg total) by mouth two (2) times a day. 480 capsule 11   ??? sulfamethoxazole-trimethoprim (BACTRIM) 400-80 mg per tablet Take 1 tablet (80 mg of trimethoprim total) by mouth Every Monday, Wednesday, and Friday. 12 tablet 5   ??? traMADoL (ULTRAM) 50 mg tablet Take 1-2 tablets (50-100 mg total) by mouth two (2) times a day as needed for other (pain). 30 tablet 0   ??? valGANciclovir (VALCYTE) 450 mg tablet Take 1 tablet (450 mg total) by mouth Every Monday and Thursday. 30 tablet 2   ??? zolpidem (AMBIEN) 10 mg tablet Take 10 mg by mouth nightly as needed for sleep.       No current facility-administered medications for this visit.            Physical Exam  BP 156/80 (BP Site: R Arm, BP Position: Sitting, BP Cuff Size: Medium) Comment: pt has not taken AM BP meds yet - Pulse 84  - Temp 36.7 ??C (98 ??F) (Temporal)  - Resp 16  - Wt 68.8 kg (151 lb 10.8 oz)  - BMI 22.40 kg/m??   General: no acute distress  HEENT: wearing mask, PERRL  Neck: neck supple, no cervical lymphadenopathy appreciated  CV: normal rate, normal rhythm, no murmur, no gallops, no rubs appreciated  Lungs: clear to auscultation bilaterally  Abdomen: soft, non tender, JP drain site with scab present; transplant incision with staples c/d/i; no  edema  Extremities:  no edema, LFA AVF +Bruit/+Thrill  Musculoskeletal: no visible deformity, normal range of motion.  Pulses: intact distally throughout  Neurologic: awake, alert, and oriented x3      Laboratory Data and Imaging reviewed in EPIC      Assessment:  33 year old male status post deceased kidney transplant #2 on 02/20/2019 for primary ESRD secondary to Alport's syndrome who presents for routine follow up and post-transplant care.        Recommendations/Plan:     Allograft Function: Patient continues to have delayed graft function with a creatinine today of 9.82.  Patient had dialysis yesterday. Electrolyte and metabolic parameters in acceptable range.  Will continue to monitor. If urine output doesn't increase to 500 mL by Monday next week will perform kidney biopsy on Wednesday. Atarax refilled due to itching.    Immunosuppression Management [High Risk Medical Decision Making For Drug Therapy Requiring Intensive Monitoring For Toxicity]: Tacrolimus trough level 2.8 today.  Targeting tacrolimus trough levels of approximately 8-10 ng/mL. Increased patient's Tacrolimus to 12mg  BID today and starting on Envarsus 20 mg tomorrow (03/03/19).  Increase mycophenolate to 540 mg AM, 360 mg afternoon, 540 mg PM.     Blood Pressure Management: BP today is 156/80.  BP at home 130-140's systolic.  Continue current regimen as BP is stable at home.     Lipid Management: Total cholesterol today 143, with trig 179, HDL 35, and LDL 72.  Will continue to monitor.     Infectious Prophylaxis and Monitoring: CMV VL and BK VL pending. Urine unable to be collected at this time.  Patient continues to be on Valcyte and Bactrim.     Health Maintenance:   Flu shot 30 days post transplant.  Immunizations 1 year post transplant      Counseling:  I counseled the patient on:  The need to avoid sun exposure and the use of sunblock while outdoors given the relatively higher risk of skin malignancy in an immunosuppressed state.  The need for adherence to immunosuppression medication.  Patient verbalized understanding.     Follow-Up:  Return to clinic in 2 weeks at [facility]  Dialysis: TTS pending UOP  Labs: 3 days a week till off dialysis  Transplant Surgery Follow up: 03/07/19  Patient will continue to follow-up with his primary care provider for non-transplant related issues and medication refills. We have ordered transplant specific labs per the center's guidelines to monitor and assess for toxicities from immunosuppressant drug therapy

## 2019-03-01 NOTE — Unmapped (Addendum)
[facility]   Patient Onboarding/Medication Counseling    [name] is a 33 y.o. male with kidney transplant who I am counseling today on continuation of therapy.  I am speaking to the patient.    Verified patient's date of birth / HIPAA.    Specialty medication(s) to be sent: None- Patient is being switched to Envarsus.      Non-specialty medications/supplies to be sent: n/a      Medications not needed at this time: n/a - pt wants call back in 2 weeks on all other meds         Prograf (tacrolimus)    [facility] Pharmacist Intervention    Type of intervention: change from brand to generic of NTI drug    Medication: prograf/tac    Problem: due to medicaid restrictions, clinic ok'ed change to generic tac    Intervention: pt aware of change, need for labwork, and to contact clinic on date of switch. I will message clinic as well    Follow up needed: see above    Approximate time spent: 10 minutes    [facility]   [facility] Specialty Pharmacist        Medication & Administration     Dosage: take 8 capsules (8mg ) by mouth twice daily - our most recent rx is 7bid, but pt states this was changed today, reaching out to clinic for new rx today     Administration:   ? May take with or without food  ? Take 12 hours apart    Adherence/Missed dose instructions:  ? Take a missed dose as soon as you think about it.  ? If it is close to the time for your next dose, skip the missed dose and go back to your normal time.  ? Do not take 2 doses at the same time or extra doses.    Goals of Therapy     ? To prevent organ rejection    Side Effects & Monitoring Parameters     ? Common side effects  ? Dizziness  ? Fatigue  ? Headache  ? Stuffy nose or sore throat  ? Nausea, vomiting, stomach pain, diarrhea, constipation  ? Heartburn  ? Back or joint pain  ? Increased risk of infection    ? The following side effects should be reported to the provider: ? Allergic reaction  ? Kidney issues (change in quantity or urine passed, blood in urine, or weight gain)  ? High blood pressure (dizziness, change in eyesight, headache)  ? Electrolyte issues (change in mood, confusion, muscle pain, or weakness)  ? Abnormal breathing  ? Shakiness  ? Unexplained bleeding or bruising (gums bleeding, blood in urine, nosebleeds, any abnormal bleeding)  ? Signs of infection  ? Skin changes (sores, paleness, new or changed bumps or moles)    ? Monitoring Parameters  ? Renal function  ? Liver function  ? Glucose levels  ? Blood pressure  ? Tacrolimus trough levels  ? Cardiac monitoring (for QT prolongation)      Contraindications, Warnings, & Precautions     ? Black Box Warning: Infections - immunosuppressant agents increase the risk of infection that may lead to hospitalization or death  ? Black Box Warning: Malignancy - immunosuppressant agents may be associated with the development of malignancies that may lead to hospitalization or death  ? Limit or avoid sun and ultraviolet light exposure, use appropriate sun protection  ?  Myocardial hypertrophy -avoid use in patients with congenital long QT syndrome  ? Diabetes mellitus - the risk for new-onset diabetes and insulin-dependent post-transplant diabetes mellitus is increased with tacrolimus use after transplantation  ? GI perforation  ? Hyperkalemia  ? Hypertension  ? Nephrotoxicity  ? Neurotoxicity  ? This is a narrow therapeutic index drug. Do not switch manufacturers without first talking to the provider.    Drug/Food Interactions     ? Medication list reviewed in Epic. The patient was instructed to inform the care team before taking any new medications or supplements. no interactions noted that clinic is not already monitoring.   ? Avoid alcohol  ? Avoid grapefruit or grapefruit juice  ? Avoid live vaccines    Storage, Handling Precautions, & Disposal     ? Store at room temperature  ? Keep away from children and pets      Current Medications (including OTC/herbals), Comorbidities and Allergies     Current Outpatient Medications   Medication Sig Dispense Refill   ??? acetaminophen (TYLENOL) 500 MG tablet Take 1-2 tablets (500-1,000 mg total) by mouth every six (6) hours as needed for pain or fever (> 38C). 100 tablet 0   ??? amLODIPine (NORVASC) 5 MG tablet Take 2 tablets (10 mg total) by mouth daily. 60 tablet 11   ??? aspirin (ECOTRIN) 81 MG tablet Take 1 tablet (81 mg total) by mouth daily. HOLD until directed to start in clinic. 30 tablet 11   ??? docusate sodium (COLACE) 100 MG capsule Take 1 capsule (100 mg total) by mouth two (2) times a day as needed for constipation. 60 capsule 0   ??? gabapentin (NEURONTIN) 100 MG capsule Take 1 capsule (100 mg total) by mouth nightly for 12 days. 12 capsule 0   ??? hydrOXYzine (ATARAX) 25 MG tablet Take 25 mg by mouth every eight (8) hours as needed for itching.     ??? magnesium oxide-Mg AA chelate (MAGNESIUM, AMINO ACID CHELATE,) 133 mg Tab Take 1 tablet by mouth Two (2) times a day. HOLD until directed to start by your coordinator. 100 tablet 11   ??? MYFORTIC 180 mg EC tablet Take 3 tablets (540 mg total) by mouth Two (2) times a day. 180 tablet 11   ??? polyethylene glycol (MIRALAX) 17 gram packet MIx 1 packet (17 gm) in 8 oz of water, tea, coffee, or juice and drink by mouth daily as needed. 30 each 0   ??? PROGRAF 1 mg capsule Take 7 capsules (7 mg total) by mouth two (2) times a day. 420 capsule 11   ??? sulfamethoxazole-trimethoprim (BACTRIM) 400-80 mg per tablet Take 1 tablet (80 mg of trimethoprim total) by mouth Every Monday, Wednesday, and Friday. 12 tablet 5   ??? traMADoL (ULTRAM) 50 mg tablet Take 1-2 tablets (50-100 mg total) by mouth two (2) times a day as needed for other (pain). 30 tablet 0   ??? valGANciclovir (VALCYTE) 450 mg tablet Take 1 tablet (450 mg total) by mouth Every Monday and Thursday. 30 tablet 2   ??? zolpidem (AMBIEN) 10 mg tablet Take 10 mg by mouth nightly as needed for sleep.       No current facility-administered medications for this visit.        No Known Allergies    Patient Active Problem List   Diagnosis   ??? Complication of transplanted kidney   ??? Alport syndrome   ??? Esophageal reflux   ??? Essential (primary) hypertension   ??? H/O  kidney transplant   ??? Pityriasis versicolor   ??? Acute renal failure (CMS-HCC)   ??? Anemia   ??? Transplant rejection   ??? Hyperkalemia   ??? Kidney transplant rejection   ??? Acute dyspnea   ??? ESRD on dialysis (CMS-HCC)   ??? Secondary hyperparathyroidism of renal origin (CMS-HCC)   ??? Kidney replaced by transplant       Reviewed and up to date in Epic.    Appropriateness of Therapy     Is medication and dose appropriate based on diagnosis? Yes    Baseline Quality of Life Assessment      How many days over the past month did your transplant keep you from your normal activities? new transplant so no days normal but doing well    Financial Information     Medication Assistance provided: None Required    Anticipated copay of $0 reviewed with patient. Verified delivery address.    Delivery Information     Scheduled delivery date: N/A  Patient has been changed to Envarsus and does not need the medicine anymore.    Expected start date: pt is currently already taking    Medication will be delivered via UPS to the home address in [place].  This shipment will require a signature.      Explained the services we provide at [facility] and that each month we would call to set up refills.  Stressed importance of returning phone calls so that we could ensure they receive their medications in time each month.  Informed patient that we should be setting up refills 7-10 days prior to when they will run out of medication.  A pharmacist will reach out to perform a clinical assessment periodically.  Informed patient that a welcome packet and a drug information handout will be sent.      Patient verbalized understanding of the above information as well as how to contact the pharmacy at [phone] option 4 with any questions/concerns.  The pharmacy is open Monday through Friday 8:30am-4:30pm.  A pharmacist is available 24/7 via pager to answer any clinical questions they may have.    Patient Specific Needs     ? Does the patient have any physical, cognitive, or cultural barriers? No    ? Patient prefers to have medications discussed with  Patient     ? Is the patient able to read and understand education materials at a high school level or above? No    ? Patient's primary language is  English     ? Is the patient high risk? Yes, patient taking a REMS drug     ? Does the patient require a Care Management Plan? No     ? Does the patient require physician intervention or other additional services (i.e. nutrition, smoking cessation, social work)? No      [facility]  [facility] Specialty Pharmacist

## 2019-03-01 NOTE — Unmapped (Signed)
[facility]   Patient Onboarding/Medication Counseling    Russell Wade is a 33 y.o. male with kidney transplant who I am counseling today on continuation of therapy.  I am speaking to the patient.    Verified patient's date of birth / HIPAA.    Specialty medication(s) to be sent: n/a      Non-specialty medications/supplies to be sent: n/a      Medications not needed at this time: n/a           Valcyte (valganciclovir)    Medication & Administration     Dosage:   ? Take 1 tablet (450mg ) by mouth every Monday and Thursday    Administration:   ? Take with food  ? Swallow the pills whole, do not break, crush, or chew    Adherence/Missed dose instructions:  ? Take a missed dose as soon as you think about it with food  ? If it is close to your next dose, skip the missed dose and go back to your normal time.  ? Do not take 2 doses at the same time or extra doses.  ? Report any missed doses to coordinator    Goals of Therapy     ? To prevent or treat CMV infection in setting of solid organ transplant    Side Effects & Monitoring Parameters     ? Common side effects  ? Headache  ? Diarrhea or constipation  ? Appetite or sleep disturbances  ? Back, muscle, joint, or belly pain  ? Weight loss  ? Dizziness  ? Muscle spasm  ? Upset stomach or vomiting    ? The following side effects should be reported to the provider:  ? Allergic reaction  (rash, hives, swelling, blistered or peeling skin, shortness of breath)  ? Infection (fever, chills, sore throat, ear/sinus pain, cough, sputum change, urinary pain, mouth sores, non-healing wounds)  ? Bleeding (cough ground vomit, blood in urine, black/red/tarry stools, unexplained bruising or bleeding)  ? Electrolyte problems (mood changes, confusion, weakness, abnormal heartbeat, seizures)  ? Kidney problems (urine changes, weight gain)  ? Yellowing skin or eyes  ? Swelling in arms, legs, stomach  ? Severe dizziness or passing out  ? Eye issues (eyesight changes, pain, or irritation)  ? Night sweats    ? Monitoring parameters  ? Have eye exam as directed by doctor  ? CMV counts  ? CBC  ? Renal function  ? Pregnancy test prior to initiation    Contraindications, Warnings, & Precautions     ? BBW: severe leukopenia, neutropenia, anemia, thrombocytopenia, pancytopenia, and bone marrow failure, including aplastic anemia have been reported  ? BBW: may cause temporary or permanent inhibition of spermatogenesis and suppression of fertilty; has the potential to cause birth defects and cancers in humans  ? Male patients should have pregnancy test prior to initiation and use birth control for at least 30 days after discontinuation  ? Male patients should use a barrier contraceptive while on therapy and for 90 days after discontinuation  ? Acute renal failure  ? Not indicated for use in liver transplant recipients  ? Breastfeeding is not recommended    Drug/Food Interactions     ? Medication list reviewed in Epic. The patient was instructed to inform the care team before taking any new medications or supplements. no interactions noted that clinic is not already monitoring.   ? Check with your doctor before getting any vaccinations (live or inactivated)  Storage, Handling Precautions, & Disposal     ? Store at room temperature  ? Keep away from children and pets      Current Medications (including OTC/herbals), Comorbidities and Allergies     Current Outpatient Medications   Medication Sig Dispense Refill   ??? acetaminophen (TYLENOL) 500 MG tablet Take 1-2 tablets (500-1,000 mg total) by mouth every six (6) hours as needed for pain or fever (> 38C). 100 tablet 0   ??? amLODIPine (NORVASC) 5 MG tablet Take 2 tablets (10 mg total) by mouth daily. 60 tablet 11   ??? aspirin (ECOTRIN) 81 MG tablet Take 1 tablet (81 mg total) by mouth daily. HOLD until directed to start in clinic. 30 tablet 11   ??? docusate sodium (COLACE) 100 MG capsule Take 1 capsule (100 mg total) by mouth two (2) times a day as needed for constipation. 60 capsule 0   ??? gabapentin (NEURONTIN) 100 MG capsule Take 1 capsule (100 mg total) by mouth nightly for 12 days. 12 capsule 0   ??? hydrOXYzine (ATARAX) 25 MG tablet Take 25 mg by mouth every eight (8) hours as needed for itching.     ??? magnesium oxide-Mg AA chelate (MAGNESIUM, AMINO ACID CHELATE,) 133 mg Tab Take 1 tablet by mouth Two (2) times a day. HOLD until directed to start by your coordinator. 100 tablet 11   ??? MYFORTIC 180 mg EC tablet Take 3 tablets (540 mg total) by mouth Two (2) times a day. 180 tablet 11   ??? polyethylene glycol (MIRALAX) 17 gram packet MIx 1 packet (17 gm) in 8 oz of water, tea, coffee, or juice and drink by mouth daily as needed. 30 each 0   ??? PROGRAF 1 mg capsule Take 7 capsules (7 mg total) by mouth two (2) times a day. 420 capsule 11   ??? sulfamethoxazole-trimethoprim (BACTRIM) 400-80 mg per tablet Take 1 tablet (80 mg of trimethoprim total) by mouth Every Monday, Wednesday, and Friday. 12 tablet 5   ??? traMADoL (ULTRAM) 50 mg tablet Take 1-2 tablets (50-100 mg total) by mouth two (2) times a day as needed for other (pain). 30 tablet 0   ??? valGANciclovir (VALCYTE) 450 mg tablet Take 1 tablet (450 mg total) by mouth Every Monday and Thursday. 30 tablet 2   ??? zolpidem (AMBIEN) 10 mg tablet Take 10 mg by mouth nightly as needed for sleep.       No current facility-administered medications for this visit.        No Known Allergies    Patient Active Problem List   Diagnosis   ??? Complication of transplanted kidney   ??? Alport syndrome   ??? Esophageal reflux   ??? Essential (primary) hypertension   ??? H/O kidney transplant   ??? Pityriasis versicolor   ??? Acute renal failure (CMS-HCC)   ??? Anemia   ??? Transplant rejection   ??? Hyperkalemia   ??? Kidney transplant rejection   ??? Acute dyspnea   ??? ESRD on dialysis (CMS-HCC)   ??? Secondary hyperparathyroidism of renal origin (CMS-HCC)   ??? Kidney replaced by transplant       Reviewed and up to date in Epic.    Appropriateness of Therapy     Is medication and dose appropriate based on diagnosis? Yes    Baseline Quality of Life Assessment      How many days over the past month did your transplant keep you from your normal activities? new transplant so no days normal  but pt states doing ok    Financial Information     Medication Assistance provided: None Required    Anticipated copay of $0 reviewed with patient. Verified delivery address.    Delivery Information     Scheduled delivery date: pt wants call back in 2 weeks    Expected start date: pt currently taking    Medication will be delivered via n/a to the n/a address in Epic [place].  This shipment will not require a signature.      Explained the services we provide at [facility] and that each month we would call to set up refills.  Stressed importance of returning phone calls so that we could ensure they receive their medications in time each month.  Informed patient that we should be setting up refills 7-10 days prior to when they will run out of medication.  A pharmacist will reach out to perform a clinical assessment periodically.  Informed patient that a welcome packet and a drug information handout will be sent.      Patient verbalized understanding of the above information as well as how to contact the pharmacy at [phone] option 4 with any questions/concerns.  The pharmacy is open Monday through Friday 8:30am-4:30pm.  A pharmacist is available 24/7 via pager to answer any clinical questions they may have.    Patient Specific Needs     ? Does the patient have any physical, cognitive, or cultural barriers? No    ? Patient prefers to have medications discussed with  Patient     ? Is the patient able to read and understand education materials at a high school level or above? No    ? Patient's primary language is  English     ? Is the patient high risk? Yes, patient taking a REMS drug     ? Does the patient require a Care Management Plan? No     ? Does the patient require physician intervention or other additional services (i.e. nutrition, smoking cessation, social work)? No      [name]  [facility] Specialty Pharmacist

## 2019-03-02 ENCOUNTER — Ambulatory Visit: Admit: 2019-03-02 | Discharge: 2019-03-02 | Payer: MEDICARE

## 2019-03-02 DIAGNOSIS — Z9089 Acquired absence of other organs: Secondary | ICD-10-CM

## 2019-03-02 DIAGNOSIS — E559 Vitamin D deficiency, unspecified: Secondary | ICD-10-CM

## 2019-03-02 DIAGNOSIS — Q8781 Alport syndrome: Secondary | ICD-10-CM

## 2019-03-02 DIAGNOSIS — I120 Hypertensive chronic kidney disease with stage 5 chronic kidney disease or end stage renal disease: Secondary | ICD-10-CM

## 2019-03-02 DIAGNOSIS — N186 End stage renal disease: Secondary | ICD-10-CM

## 2019-03-02 DIAGNOSIS — E785 Hyperlipidemia, unspecified: Secondary | ICD-10-CM

## 2019-03-02 DIAGNOSIS — D631 Anemia in chronic kidney disease: Secondary | ICD-10-CM

## 2019-03-02 DIAGNOSIS — Z79899 Other long term (current) drug therapy: Secondary | ICD-10-CM

## 2019-03-02 DIAGNOSIS — D899 Disorder involving the immune mechanism, unspecified: Secondary | ICD-10-CM

## 2019-03-02 DIAGNOSIS — Z940 Kidney transplant status: Secondary | ICD-10-CM

## 2019-03-02 DIAGNOSIS — Z992 Dependence on renal dialysis: Secondary | ICD-10-CM

## 2019-03-02 DIAGNOSIS — Z7982 Long term (current) use of aspirin: Secondary | ICD-10-CM

## 2019-03-02 LAB — CBC W/ AUTO DIFF
BASOPHILS ABSOLUTE COUNT: 0 10*9/L (ref 0.0–0.1)
BASOPHILS RELATIVE PERCENT: 0.3 %
EOSINOPHILS RELATIVE PERCENT: 0.5 %
HEMATOCRIT: 30.9 % — ABNORMAL LOW (ref 41.0–53.0)
HEMOGLOBIN: 9.8 g/dL — ABNORMAL LOW (ref 13.5–17.5)
LARGE UNSTAINED CELLS: 0 % (ref 0–4)
LYMPHOCYTES ABSOLUTE COUNT: 0.1 10*9/L — ABNORMAL LOW (ref 1.5–5.0)
LYMPHOCYTES RELATIVE PERCENT: 1.4 %
MEAN CORPUSCULAR HEMOGLOBIN CONC: 31.7 g/dL (ref 31.0–37.0)
MEAN CORPUSCULAR HEMOGLOBIN: 27.1 pg (ref 26.0–34.0)
MEAN PLATELET VOLUME: 8.9 fL (ref 7.0–10.0)
MONOCYTES ABSOLUTE COUNT: 0.1 10*9/L — ABNORMAL LOW (ref 0.2–0.8)
MONOCYTES RELATIVE PERCENT: 2.7 %
NEUTROPHILS ABSOLUTE COUNT: 4.5 10*9/L (ref 2.0–7.5)
NEUTROPHILS RELATIVE PERCENT: 94.7 %
PLATELET COUNT: 202 10*9/L (ref 150–440)
RED BLOOD CELL COUNT: 3.62 10*12/L — ABNORMAL LOW (ref 4.50–5.90)
RED CELL DISTRIBUTION WIDTH: 18.3 % — ABNORMAL HIGH (ref 12.0–15.0)
WBC ADJUSTED: 4.8 10*9/L (ref 4.5–11.0)

## 2019-03-02 LAB — LIPID PANEL
CHOLESTEROL: 143 mg/dL (ref 100–199)
HDL CHOLESTEROL: 35 mg/dL — ABNORMAL LOW (ref 40–59)
NON-HDL CHOLESTEROL: 108 mg/dL
TRIGLYCERIDES: 179 mg/dL — ABNORMAL HIGH (ref 1–149)
VLDL CHOLESTEROL CAL: 35.8 mg/dL (ref 10–50)

## 2019-03-02 LAB — TACROLIMUS, TROUGH: Lab: 2.8 — ABNORMAL LOW

## 2019-03-02 LAB — HLA CLASS 1 ANTIBODY

## 2019-03-02 LAB — PHOSPHORUS: Phosphate:MCnc:Pt:Ser/Plas:Qn:: 6.2 — ABNORMAL HIGH

## 2019-03-02 LAB — CHOLESTEROL/HDL RATIO SCREEN: Lab: 4.1

## 2019-03-02 LAB — COMPREHENSIVE METABOLIC PANEL
ALBUMIN: 4.2 g/dL (ref 3.5–5.0)
ALKALINE PHOSPHATASE: 65 U/L (ref 38–126)
ALT (SGPT): 32 U/L (ref <50)
AST (SGOT): 20 U/L (ref 19–55)
BILIRUBIN TOTAL: 0.8 mg/dL (ref 0.0–1.2)
BLOOD UREA NITROGEN: 31 mg/dL — ABNORMAL HIGH (ref 7–21)
BUN / CREAT RATIO: 3
CALCIUM: 9.8 mg/dL (ref 8.5–10.2)
CHLORIDE: 90 mmol/L — ABNORMAL LOW (ref 98–107)
CREATININE: 9.82 mg/dL — ABNORMAL HIGH (ref 0.70–1.30)
EGFR CKD-EPI AA MALE: 7 mL/min/{1.73_m2} — ABNORMAL LOW (ref >=60)
EGFR CKD-EPI NON-AA MALE: 6 mL/min/{1.73_m2} — ABNORMAL LOW (ref >=60)
GLUCOSE RANDOM: 104 mg/dL (ref 70–179)
POTASSIUM: 3.8 mmol/L (ref 3.5–5.0)
PROTEIN TOTAL: 7.2 g/dL (ref 6.5–8.3)
SODIUM: 133 mmol/L — ABNORMAL LOW (ref 135–145)

## 2019-03-02 LAB — BASOPHILS RELATIVE PERCENT: Lab: 0.3

## 2019-03-02 LAB — FSAB CLASS 2 ANTIBODY SPECIFICITY: HLA CL2 AB RESULT: POSITIVE

## 2019-03-02 LAB — BLOOD UREA NITROGEN: Urea nitrogen:MCnc:Pt:Ser/Plas:Qn:: 31 — ABNORMAL HIGH

## 2019-03-02 LAB — VITAMIN D, TOTAL (25OH): Lab: 9.6 — ABNORMAL LOW

## 2019-03-02 LAB — FSAB CLASS 1 ANTIBODY SPECIFICITY: HLA CLASS 1 ANTIBODY RESULT: POSITIVE

## 2019-03-02 LAB — CLASS 2 ANTIBODIES IDENTIFIED

## 2019-03-02 LAB — MAGNESIUM: Magnesium:MCnc:Pt:Ser/Plas:Qn:: 2

## 2019-03-02 LAB — TACROLIMUS LEVEL: Tacrolimus (FK506) - LabCorp: 2.9 ng/mL (ref 2.0–20.0)

## 2019-03-02 MED ORDER — MYFORTIC 180 MG TABLET,DELAYED RELEASE
ORAL_TABLET | 11 refills | 0 days
Start: 2019-03-02 — End: 2019-03-15

## 2019-03-02 MED ORDER — HYDROXYZINE HCL 25 MG TABLET
ORAL_TABLET | Freq: Three times a day (TID) | ORAL | 11 refills | 10.00000 days | Status: CP | PRN
Start: 2019-03-02 — End: 2020-03-01

## 2019-03-02 MED ORDER — TACROLIMUS ER 4 MG TABLET,EXTENDED RELEASE 24 HR
ORAL_TABLET | 11 refills | 0 days | Status: CP
Start: 2019-03-02 — End: 2019-03-07

## 2019-03-02 MED ORDER — TACROLIMUS ER 1 MG TABLET,EXTENDED RELEASE 24 HR
ORAL_TABLET | 11 refills | 0 days | Status: CP
Start: 2019-03-02 — End: 2019-03-07
  Filled 2019-03-02: qty 120, 30d supply, fill #0

## 2019-03-02 MED FILL — ENVARSUS XR 4 MG TABLET,EXTENDED RELEASE: 30 days supply | Qty: 120 | Fill #0

## 2019-03-02 MED FILL — ENVARSUS XR 4 MG TABLET,EXTENDED RELEASE: 30 days supply | Qty: 120 | Fill #0 | Status: AC

## 2019-03-02 MED FILL — ENVARSUS XR 1 MG TABLET,EXTENDED RELEASE: 30 days supply | Qty: 120 | Fill #0 | Status: AC

## 2019-03-02 NOTE — Unmapped (Signed)
[facility] TRANSPLANT VIRTUAL PHARMACY NOTE  03/02/2019   Russell Wade  [id number]    Medication changes today:   1. Stop tacrolimus  2. Start Envarsus 20 mg daily  3. Increase Myfortic to 540/360/540 mg.  Once Envarsus level is therapeutic may consider decreasing back to 540 mg BID  4. Restart calcium acetate 1334 mg TID AC    Education/Adherence tools provided today:  1.provided updated medication list  2. provided additional education on immunosuppression and transplant related medications including reviewing indications of medications, dosing and side effects  3.  provided additional pill box education    Follow up items:  1. goal of understanding indications and dosing of immunosuppression medications  2. Envarsus level  3. GI tolerance of higher dose Myfortic    Next visit with pharmacy in 1-2 weeks  ____________________________________________________________________    Russell Wade is a 33 y.o. male s/p deceased kidney transplant on 03/19/2019 (Kidney), 11/12/2007 (Kidney) 2/2 Alport's syndrome. 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    Other PMH significant for parathyroidectomy with autotransplant    Post op course complicated by: DGF requiring maintenance on HD    Seen by pharmacy today for: medication management and pill box fill and adherence education; last seen by pharmacy first visit     CC:  Patient moderate incisional site pain     There were no vitals filed for this visit.    No Known Allergies    All medications reviewed and updated.     Medication list includes revisions made during today???s encounter    Outpatient Encounter Medications as of 03/02/2019   Medication Sig Dispense Refill   ??? acetaminophen (TYLENOL) 500 MG tablet Take 1-2 tablets (500-1,000 mg total) by mouth every six (6) hours as needed for pain or fever (> 38C). 100 tablet 0   ??? amLODIPine (NORVASC) 5 MG tablet Take 2 tablets (10 mg total) by mouth daily. 60 tablet 11   ??? aspirin (ECOTRIN) 81 MG tablet Take 1 tablet (81 mg total) by mouth daily. HOLD until directed to start in clinic. 30 tablet 11   ??? docusate sodium (COLACE) 100 MG capsule Take 1 capsule (100 mg total) by mouth two (2) times a day as needed for constipation. 60 capsule 0   ??? gabapentin (NEURONTIN) 100 MG capsule Take 1 capsule (100 mg total) by mouth nightly for 12 days. 12 capsule 0   ??? hydrOXYzine (ATARAX) 25 MG tablet Take 25 mg by mouth every eight (8) hours as needed for itching.     ??? magnesium oxide-Mg AA chelate (MAGNESIUM, AMINO ACID CHELATE,) 133 mg Tab Take 1 tablet by mouth Two (2) times a day. HOLD until directed to start by your coordinator. 100 tablet 11   ??? MYFORTIC 180 mg EC tablet Take 3 tablets (540 mg total) by mouth Two (2) times a day. 180 tablet 11   ??? polyethylene glycol (MIRALAX) 17 gram packet MIx 1 packet (17 gm) in 8 oz of water, tea, coffee, or juice and drink by mouth daily as needed. 30 each 0   ??? PROGRAF 1 mg capsule Take 8 capsules (8 mg total) by mouth two (2) times a day. 480 capsule 11   ??? sulfamethoxazole-trimethoprim (BACTRIM) 400-80 mg per tablet Take 1 tablet (80 mg of trimethoprim total) by mouth Every Monday, Wednesday, and Friday. 12 tablet 5   ??? traMADoL (ULTRAM) 50 mg tablet Take 1-2 tablets (50-100 mg total) by mouth two (2)  times a day as needed for other (pain). 30 tablet 0   ??? valGANciclovir (VALCYTE) 450 mg tablet Take 1 tablet (450 mg total) by mouth Every Monday and Thursday. 30 tablet 2   ??? zolpidem (AMBIEN) 10 mg tablet Take 10 mg by mouth nightly as needed for sleep.       No facility-administered encounter medications on file as of 03/02/2019.        Induction agent : alemtuzumab    CURRENT IMMUNOSUPPRESSION: tacrolimus 8 mg PO bid  prograf/Envarsus/cyclosporine goal: 8-10   myfortic540  mg PO bid    steroid free     Patient is tolerating immunosuppression well    IMMUNOSUPPRESSION DRUG LEVELS:  Lab Results   Component Value Date    Tacrolimus, Trough 1.8 (L) 02/25/2019    Tacrolimus, Trough 3.0 (L) 02/23/2019    Tacrolimus, Trough 3.0 (L) 02/22/2019    Tacrolimus, Trough <0.5 03/17/2014    Tacrolimus, Trough 7.7 04/05/2013    Tacrolimus, Trough 7.1 03/29/2013     No results found for: CYCLO  No results found for: EVEROLIMUS  No results found for: SIROLIMUS    Prograf level is accurate 12 hour trough    Graft function: complicated by DGF  DSA: ntd  Biopsies to date: zero hour biopsy negative for diagnostic abnormalities  WBC/ANC:  wnl    Plan: Based off of high tacrolimus requirement currently and from last transplant (dose was 12 mg BID in 2015 with levels in 5-7 range) will transition patient to Envarsus 20 mg daily.  Will increase Myfortic to 540/360/540. If UO doesn't improve by Monday will schedule a biopsy for 1 week from today. Continue to monitor.    OI Prophylaxis:   CMV Status: D+/ R+, moderate risk . CMV prophylaxis: valganciclovir 450 mg two days per week x 3 months per protocol.  Lab Results   Component Value Date    CMV Quant 1468 08/22/2008     PCP Prophylaxis: bactrim SS 1 tab MWF x 6 months.  Thrush: completed in hospital  Patient is  tolerating infectious prophylaxis well    Plan: Continue per protocol.  Advised patient to take Valcyte after HD on dialysis days. Continue to monitor.    CV Prophylaxis: asa 81 mg on hold for potential biopsy  The ASCVD Risk score Denman George DC Montez Hageman, et al., 2013) failed to calculate.  Statin therapy: Not indicatedgiven age; currently on no statin  Plan: consider starting statin at a later date. Continue to monitor     BP: Goal < 140/90. Clinic vitals reported above  Home BP ranges: 130-140s systolic  Current meds include: amlodipine 10 mg daily  Plan: within goal. Continue to monitor    Anemia of CKD:  H/H:   Lab Results   Component Value Date    HGB 9.3 (L) 02/28/2019     Lab Results   Component Value Date    HCT 28.3 (L) 02/28/2019     Iron panel:  Lab Results   Component Value Date    IRON 17 (L) 03/21/2014 TIBC 110 (L) 03/21/2014    FERRITIN 1,960 (H) 03/21/2014     Lab Results   Component Value Date    Iron Saturation (%) 15 (L) 03/21/2014       Prior ESA use: unclear if still getting at HD    Plan: stable. Continue to monitor.     DM:   Lab Results   Component Value Date    A1C 4.9 02/20/2019   .  Goal A1c < 7  History of Dm? No  Diet:did not address  Exercise:not yet  Hypoglycemia: no   Fluid intake: 4-5 bottles daily  Plan:  Continue to monitor    Electrolytes: Phos 6.2  Meds currently on: none  Plan: Restart Phoslo 1334 mg TID AC.  As kidney function improves can likely DC binder. Continue to monitor     GI/BM: pt reports diarrhea initially but now stools are just soft  Meds currently on: docusate PRN (not using), Miralax PRN (not using)  Plan: Continue to monitor    Pain: pt reports moderate incisional site pain  Meds currently on: APAP PRN (using 1g BID), tramadol PRN (using 1-2 daily), gabapentin per protocol  Plan: Continue to monitor    Bone health:   Vitamin D Level: 9.6 on 03/02/19. Goal > 30.   Last DEXA results:  none available  Current meds include: none  Plan: Vitamin D level  out of goal,start ergocalciferol at next visit. Continue to monitor.     Women's/Men's Health:  Russell Wade is a 33 y.o. male. Patient reports no men's/women's health issues  Plan: Continue to monitor    Insomnia  Meds currently on: zolpidem PRN (hasn't needed)  Plan: continue to monitor    Itching  Meds currently on: hydroxyzine PRN  Plan: continue to monitor    Pharmacy preference:  [facility]    Adherence: Patient has average understanding of medications; was able to independently identify names/doses of immunosuppressants and OI meds.  Patient  does fill their own pill box on a regular basis at home.  Patient brought medication card:yes  Pill box:was correct  Plan: ; provided extensive adherence counseling/intervention    Spent approximately 40 minutes on educating this patient and greater than 50% was spent in direct face to face counseling regarding post transplant medication education. Questions and concerns were address to patient's satisfaction.    Patient was reviewed with Dr. Margaretmary Bayley who was agreement with the stated plan:     During this visit, the following was completed:   BG log data assessment  BP log data assessment  Labs ordered and evaluated  complex treatment plan >1 DS   Patient education was completed for 11-24 minutes     All questions/concerns were addressed to the patient's satisfaction.  __________________________________________  Cleone Slim, PHARMD, CPP  SOLID ORGAN TRANSPLANT CLINICAL PHARMACIST PRACTITIONER  PAGER [phone]

## 2019-03-03 DIAGNOSIS — N186 End stage renal disease: Secondary | ICD-10-CM | POA: Diagnosis not present

## 2019-03-03 DIAGNOSIS — Z23 Encounter for immunization: Secondary | ICD-10-CM | POA: Diagnosis not present

## 2019-03-03 DIAGNOSIS — Z992 Dependence on renal dialysis: Secondary | ICD-10-CM | POA: Diagnosis not present

## 2019-03-03 DIAGNOSIS — N2581 Secondary hyperparathyroidism of renal origin: Secondary | ICD-10-CM | POA: Diagnosis not present

## 2019-03-03 DIAGNOSIS — D509 Iron deficiency anemia, unspecified: Secondary | ICD-10-CM | POA: Diagnosis not present

## 2019-03-03 DIAGNOSIS — D631 Anemia in chronic kidney disease: Secondary | ICD-10-CM | POA: Diagnosis not present

## 2019-03-03 MED ORDER — CALCIUM ACETATE(PHOSPHATE BINDERS) 667 MG CAPSULE
ORAL_CAPSULE | Freq: Three times a day (TID) | ORAL | 11 refills | 30.00000 days
Start: 2019-03-03 — End: 2020-03-02

## 2019-03-04 ENCOUNTER — Other Ambulatory Visit (HOSPITAL_COMMUNITY)
Admission: RE | Admit: 2019-03-04 | Discharge: 2019-03-04 | Disposition: A | Discharge status: Home / Self Care | Payer: Medicare Other | Source: Other Acute Inpatient Hospital | Attending: Nephrology | Admitting: Nephrology

## 2019-03-04 DIAGNOSIS — Z940 Kidney transplant status: Secondary | ICD-10-CM

## 2019-03-04 DIAGNOSIS — Z79899 Other long term (current) drug therapy: Secondary | ICD-10-CM | POA: Diagnosis not present

## 2019-03-04 DIAGNOSIS — E1129 Type 2 diabetes mellitus with other diabetic kidney complication: Secondary | ICD-10-CM | POA: Diagnosis not present

## 2019-03-04 DIAGNOSIS — D631 Anemia in chronic kidney disease: Secondary | ICD-10-CM | POA: Diagnosis not present

## 2019-03-04 DIAGNOSIS — Z9483 Pancreas transplant status: Secondary | ICD-10-CM | POA: Insufficient documentation

## 2019-03-04 DIAGNOSIS — Z09 Encounter for follow-up examination after completed treatment for conditions other than malignant neoplasm: Secondary | ICD-10-CM | POA: Diagnosis not present

## 2019-03-04 DIAGNOSIS — Z114 Encounter for screening for human immunodeficiency virus [HIV]: Secondary | ICD-10-CM | POA: Diagnosis not present

## 2019-03-04 DIAGNOSIS — E559 Vitamin D deficiency, unspecified: Secondary | ICD-10-CM | POA: Diagnosis not present

## 2019-03-04 DIAGNOSIS — N390 Urinary tract infection, site not specified: Secondary | ICD-10-CM | POA: Insufficient documentation

## 2019-03-04 DIAGNOSIS — D899 Disorder involving the immune mechanism, unspecified: Secondary | ICD-10-CM | POA: Insufficient documentation

## 2019-03-04 DIAGNOSIS — Z789 Other specified health status: Secondary | ICD-10-CM | POA: Insufficient documentation

## 2019-03-04 DIAGNOSIS — B259 Cytomegaloviral disease, unspecified: Secondary | ICD-10-CM | POA: Insufficient documentation

## 2019-03-04 LAB — CBC WITH DIFFERENTIAL/PLATELET
Abs Immature Granulocytes: 0.03 10*3/uL (ref 0.00–0.07)
Basophils Absolute: 0 10*3/uL (ref 0.0–0.1)
Basophils Relative: 1 %
Eosinophils Absolute: 0.1 10*3/uL (ref 0.0–0.5)
Eosinophils Relative: 1 %
HCT: 26.2 % — ABNORMAL LOW (ref 39.0–52.0)
Hemoglobin: 8.9 g/dL — ABNORMAL LOW (ref 13.0–17.0)
Immature Granulocytes: 1 %
Lymphocytes Relative: 0 %
Lymphs Abs: 0 10*3/uL — ABNORMAL LOW (ref 0.7–4.0)
MCH: 27.6 pg (ref 26.0–34.0)
MCHC: 34 g/dL (ref 30.0–36.0)
MCV: 81.4 fL (ref 80.0–100.0)
Monocytes Absolute: 0.3 10*3/uL (ref 0.1–1.0)
Monocytes Relative: 5 %
Neutro Abs: 5 10*3/uL (ref 1.7–7.7)
Neutrophils Relative %: 92 %
Platelets: 241 10*3/uL (ref 150–400)
RBC: 3.22 MIL/uL — ABNORMAL LOW (ref 4.22–5.81)
RDW: 15.9 % — ABNORMAL HIGH (ref 11.5–15.5)
WBC: 5.4 10*3/uL (ref 4.0–10.5)
nRBC: 0 % (ref 0.0–0.2)

## 2019-03-04 LAB — BASIC METABOLIC PANEL
Anion gap: 13 (ref 5–15)
BUN: 31 mg/dL — ABNORMAL HIGH (ref 6–20)
CO2: 27 mmol/L (ref 22–32)
Calcium: 9.5 mg/dL (ref 8.9–10.3)
Chloride: 94 mmol/L — ABNORMAL LOW (ref 98–111)
Creatinine, Ser: 10.07 mg/dL — ABNORMAL HIGH (ref 0.61–1.24)
GFR calc Af Amer: 7 mL/min — ABNORMAL LOW (ref >60)
GFR calc non Af Amer: 6 mL/min — ABNORMAL LOW (ref >60)
Glucose, Bld: 95 mg/dL (ref 70–99)
Potassium: 3.4 mmol/L — ABNORMAL LOW (ref 3.5–5.1)
Sodium: 134 mmol/L — ABNORMAL LOW (ref 135–145)

## 2019-03-04 LAB — MAGNESIUM: Magnesium: 2.1 mg/dL (ref 1.7–2.4)

## 2019-03-04 LAB — PHOSPHORUS: Phosphorus: 6.1 mg/dL — ABNORMAL HIGH (ref 2.5–4.6)

## 2019-03-05 DIAGNOSIS — N2581 Secondary hyperparathyroidism of renal origin: Secondary | ICD-10-CM | POA: Diagnosis not present

## 2019-03-05 DIAGNOSIS — Z992 Dependence on renal dialysis: Secondary | ICD-10-CM | POA: Diagnosis not present

## 2019-03-05 DIAGNOSIS — D509 Iron deficiency anemia, unspecified: Secondary | ICD-10-CM | POA: Diagnosis not present

## 2019-03-05 DIAGNOSIS — N186 End stage renal disease: Secondary | ICD-10-CM | POA: Diagnosis not present

## 2019-03-05 DIAGNOSIS — D631 Anemia in chronic kidney disease: Secondary | ICD-10-CM | POA: Diagnosis not present

## 2019-03-05 DIAGNOSIS — Z23 Encounter for immunization: Secondary | ICD-10-CM | POA: Diagnosis not present

## 2019-03-05 LAB — CMV COMMENT: Lab: 0

## 2019-03-05 LAB — CMV DNA, QUANTITATIVE, PCR

## 2019-03-06 LAB — HLA CL2 AB RESULT: Lab: POSITIVE

## 2019-03-06 LAB — HLA DS POST TRANSPLANT
ANTI-DONOR DRW #1 MFI: 25 MFI
ANTI-DONOR DRW #2 MFI: 25 MFI
ANTI-DONOR HLA-A #1 MFI: 18 MFI
ANTI-DONOR HLA-A #2 MFI: 4 MFI
ANTI-DONOR HLA-B #1 MFI: 27 MFI
ANTI-DONOR HLA-B #2 MFI: 0 MFI
ANTI-DONOR HLA-C #2 MFI: 0 MFI
ANTI-DONOR HLA-DQB #1 MFI: 281 MFI
ANTI-DONOR HLA-DQB #2 MFI: 281 MFI
ANTI-DONOR HLA-DR #1 MFI: 21 MFI
ANTI-DONOR HLA-DR #2 MFI: 25 MFI

## 2019-03-06 LAB — FSAB CLASS 2 ANTIBODY SPECIFICITY: HLA CL2 AB RESULT: POSITIVE

## 2019-03-06 LAB — FSAB CLASS 1 ANTIBODY SPECIFICITY

## 2019-03-06 LAB — HLA CLASS 1 ANTIBODY

## 2019-03-06 LAB — DONOR DRW ANTIGEN #1

## 2019-03-06 LAB — TACROLIMUS LEVEL: Tacrolimus (FK506) - LabCorp: 9.8 ng/mL (ref 2.0–20.0)

## 2019-03-07 ENCOUNTER — Ambulatory Visit: Admit: 2019-03-07 | Discharge: 2019-03-07 | Payer: MEDICARE

## 2019-03-07 ENCOUNTER — Institutional Professional Consult (permissible substitution): Admit: 2019-03-07 | Discharge: 2019-03-07 | Payer: MEDICARE

## 2019-03-07 ENCOUNTER — Ambulatory Visit: Admit: 2019-03-07 | Discharge: 2019-03-07 | Payer: MEDICARE | Attending: Surgery | Primary: Surgery

## 2019-03-07 DIAGNOSIS — Z940 Kidney transplant status: Secondary | ICD-10-CM

## 2019-03-07 DIAGNOSIS — I120 Hypertensive chronic kidney disease with stage 5 chronic kidney disease or end stage renal disease: Secondary | ICD-10-CM

## 2019-03-07 DIAGNOSIS — Z7982 Long term (current) use of aspirin: Secondary | ICD-10-CM

## 2019-03-07 DIAGNOSIS — N186 End stage renal disease: Secondary | ICD-10-CM

## 2019-03-07 DIAGNOSIS — Z79899 Other long term (current) drug therapy: Secondary | ICD-10-CM

## 2019-03-07 DIAGNOSIS — Z4822 Encounter for aftercare following kidney transplant: Secondary | ICD-10-CM

## 2019-03-07 DIAGNOSIS — Z0000 Encounter for general adult medical examination without abnormal findings: Secondary | ICD-10-CM

## 2019-03-07 LAB — CBC W/ AUTO DIFF
BASOPHILS ABSOLUTE COUNT: 0 10*9/L (ref 0.0–0.1)
BASOPHILS RELATIVE PERCENT: 0.7 %
EOSINOPHILS ABSOLUTE COUNT: 0.2 10*9/L (ref 0.0–0.4)
EOSINOPHILS RELATIVE PERCENT: 4.1 %
HEMATOCRIT: 26.9 % — ABNORMAL LOW (ref 41.0–53.0)
HEMOGLOBIN: 8.6 g/dL — ABNORMAL LOW (ref 13.5–17.5)
LARGE UNSTAINED CELLS: 1 % (ref 0–4)
LYMPHOCYTES ABSOLUTE COUNT: 0.1 10*9/L — ABNORMAL LOW (ref 1.5–5.0)
LYMPHOCYTES RELATIVE PERCENT: 1.7 %
MEAN CORPUSCULAR HEMOGLOBIN CONC: 31.8 g/dL (ref 31.0–37.0)
MEAN CORPUSCULAR HEMOGLOBIN: 27.1 pg (ref 26.0–34.0)
MEAN PLATELET VOLUME: 8 fL (ref 7.0–10.0)
MONOCYTES ABSOLUTE COUNT: 0.2 10*9/L (ref 0.2–0.8)
MONOCYTES RELATIVE PERCENT: 4.4 %
NEUTROPHILS ABSOLUTE COUNT: 4.9 10*9/L (ref 2.0–7.5)
NEUTROPHILS RELATIVE PERCENT: 88.4 %
RED BLOOD CELL COUNT: 3.16 10*12/L — ABNORMAL LOW (ref 4.50–5.90)
RED CELL DISTRIBUTION WIDTH: 18.7 % — ABNORMAL HIGH (ref 12.0–15.0)
WBC ADJUSTED: 5.5 10*9/L (ref 4.5–11.0)

## 2019-03-07 LAB — URINALYSIS
BILIRUBIN UA: NEGATIVE
GLUCOSE UA: NEGATIVE
KETONES UA: NEGATIVE
NITRITE UA: NEGATIVE
PH UA: 7 (ref 5.0–9.0)
PROTEIN UA: 100 — AB
SQUAMOUS EPITHELIAL: <1 /HPF (ref 0–5)
UROBILINOGEN UA: 0.2
WBC UA: 22 /HPF — ABNORMAL HIGH (ref <=2)

## 2019-03-07 LAB — RENAL FUNCTION PANEL
ALBUMIN: 4.1 g/dL (ref 3.5–5.0)
ANION GAP: 12 mmol/L (ref 7–15)
BLOOD UREA NITROGEN: 33 mg/dL — ABNORMAL HIGH (ref 7–21)
BUN / CREAT RATIO: 3
CHLORIDE: 96 mmol/L — ABNORMAL LOW (ref 98–107)
CO2: 27 mmol/L (ref 22.0–30.0)
CREATININE: 10.48 mg/dL — ABNORMAL HIGH (ref 0.70–1.30)
EGFR CKD-EPI AA MALE: 7 mL/min/{1.73_m2} — ABNORMAL LOW (ref >=60)
EGFR CKD-EPI NON-AA MALE: 6 mL/min/{1.73_m2} — ABNORMAL LOW (ref >=60)
GLUCOSE RANDOM: 129 mg/dL (ref 70–179)
PHOSPHORUS: 5.4 mg/dL — ABNORMAL HIGH (ref 2.9–4.7)
POTASSIUM: 4.2 mmol/L (ref 3.5–5.0)
SODIUM: 135 mmol/L (ref 135–145)

## 2019-03-07 LAB — WBC ADJUSTED: Leukocytes:NCnc:Pt:Bld:Qn:: 5.5

## 2019-03-07 LAB — POTASSIUM: Potassium:SCnc:Pt:Ser/Plas:Qn:: 4.2

## 2019-03-07 LAB — TACROLIMUS BLOOD: Lab: 7.3

## 2019-03-07 LAB — PROTEIN/CREAT RATIO, URINE: Protein/Creatinine:MRto:Pt:Urine:Qn:: 1.139

## 2019-03-07 LAB — MAGNESIUM: Magnesium:MCnc:Pt:Ser/Plas:Qn:: 2

## 2019-03-07 LAB — PROTEIN / CREATININE RATIO, URINE: PROTEIN URINE: 74.4 mg/dL

## 2019-03-07 LAB — LEUKOCYTE ESTERASE UA

## 2019-03-07 MED ORDER — CARVEDILOL 6.25 MG TABLET
ORAL_TABLET | Freq: Two times a day (BID) | ORAL | 0 refills | 30 days | Status: CP
Start: 2019-03-07 — End: 2019-03-29

## 2019-03-07 MED ORDER — FUROSEMIDE 40 MG TABLET
ORAL_TABLET | Freq: Every day | ORAL | 0 refills | 3.00000 days | Status: CP
Start: 2019-03-07 — End: 2019-03-18

## 2019-03-07 MED ORDER — ERGOCALCIFEROL (VITAMIN D2) 1,250 MCG (50,000 UNIT) CAPSULE
ORAL_CAPSULE | ORAL | 0 refills | 56.00000 days | Status: CP
Start: 2019-03-07 — End: 2020-03-06

## 2019-03-07 MED ORDER — TACROLIMUS ER 1 MG TABLET,EXTENDED RELEASE 24 HR
ORAL_TABLET | 11 refills | 0 days | Status: CP
Start: 2019-03-07 — End: ?
  Filled 2019-03-23: qty 150, 30d supply, fill #0

## 2019-03-07 MED ORDER — TACROLIMUS ER 4 MG TABLET,EXTENDED RELEASE 24 HR
ORAL_TABLET | 11 refills | 0 days | Status: CP
Start: 2019-03-07 — End: ?

## 2019-03-07 MED ORDER — LOPERAMIDE 2 MG CAPSULE
ORAL_CAPSULE | 11 refills | 0 days | Status: CP
Start: 2019-03-07 — End: ?

## 2019-03-07 NOTE — Unmapped (Signed)
Envarsus dose increase  Last filled at [facility] 5 days ago  Copay = $0

## 2019-03-07 NOTE — Unmapped (Signed)
Urine was collected and sent to the lab.

## 2019-03-07 NOTE — Unmapped (Signed)
+++++++++++++++++++    Assessment/Plan:    Russell Wade is a 33 y.o. male who underwent transplant on 02/20/2019 (Kidney), 11/12/2007 (Kidney). The patient is in clinic and after obtaining the HPI and reviewing the chart we addressed the following medical issues:       CLINIC RECOMMENDATIONS :  1. Immunosuppressive management: Envarsus adjusted to 21 mg qd, continuing with current Myfortic regimen  2. Lab frequency: 3x/week  3. Follow-up: will tentatively schedule patient for an outpatient kidney biopsy to evaluate DGF.      ______________________________________________________________________          HPI: Russell Wade is a 33 y.o. male s/p kidney transplant who presents today for a follow up visit. With respect to his functional status, he denies significant SOB, chest pain, or other exertional symptoms. He states that his urine output is increasing and his 24-hour recordings vary between 300 - 500 ml. He denies lower extremity edema.      Past Medical History    Past Medical History:   Diagnosis Date   ??? Alport syndrome    ??? ESRD (end stage renal disease) (CMS-HCC)    ??? Hearing impaired    ??? History of transfusion    ??? Hypertension    ??? Nephrotic syndrome    ??? Renal transplant disorder          Past Surgical History    Past Surgical History:   Procedure Laterality Date   ??? ABDOMINAL SURGERY     ??? AV FISTULA PLACEMENT Left 2004    left arm   ??? FRACTURE SURGERY     ??? NEPHRECTOMY TRANSPLANTED ORGAN  2007-11-04    Deceased donor renal transplant 11/04/07, episode of cellular acute rejection 11/2009     ??? PR REMOVAL OF RECIPIENT KIDNEY Right 03/20/2014    Procedure: RECIPIENT NEPHRECTOMY (SEPARATE PROCEDURE);  Surgeon: Vivi Barrack, MD;  Location: MAIN OR [facility];  Service: Transplant   ??? PR TRANSPLANT,PREP RENAL GRAFT/ARTERIAL N/A 02/20/2019    Procedure: [facility] RECONSTRUCTION CADAVER/LIVING DONOR RENAL ALLOGRAFT PRIOR TO TRANSPLANT; ARTERIAL ANASTOMOSIS EAC;  Surgeon: Leona Carry, MD; Location: MAIN OR [facility];  Service: Transplant   ??? PR TRANSPLANTATION OF KIDNEY Left 02/20/2019    Procedure: RENAL ALLOTRANSPLANTATION, IMPLANTATION OF GRAFT; WITHOUT RECIPIENT NEPHRECTOMY;  Surgeon: Leona Carry, MD;  Location: MAIN OR [facility];  Service: Transplant         MEDICATIONS:  Current Outpatient Medications   Medication Sig Dispense Refill   ??? acetaminophen (TYLENOL) 500 MG tablet Take 1-2 tablets (500-1,000 mg total) by mouth every six (6) hours as needed for pain or fever (> 38C). 100 tablet 0   ??? amLODIPine (NORVASC) 5 MG tablet Take 2 tablets (10 mg total) by mouth daily. 60 tablet 11   ??? aspirin (ECOTRIN) 81 MG tablet Take 1 tablet (81 mg total) by mouth daily. HOLD until directed to start in clinic. 30 tablet 11   ??? calcium acetate,phosphat bind, (PHOSLO) 667 mg capsule Take 2 capsules (1,334 mg total) by mouth Three (3) times a day with a meal. 180 capsule 11   ??? carvediloL (COREG) 6.25 MG tablet Take 1 tablet (6.25 mg total) by mouth Two (2) times a day. 60 tablet 0   ??? ergocalciferol (DRISDOL) 1,250 mcg (50,000 unit) capsule Take 1 capsule (50,000 Units total) by mouth once a week. 8 capsule 0   ??? furosemide (LASIX) 40 MG tablet Take 1 tablet (40 mg total) by mouth daily for 3 days. 3 tablet  0   ??? hydrOXYzine (ATARAX) 25 MG tablet Take 1 tablet (25 mg total) by mouth every eight (8) hours as needed for itching. 30 tablet 11   ??? loperamide (IMODIUM) 2 mg capsule Take 4 mg (2 tablets) at first onset of diarrhea then 2 mg (1 tablet) with additional diarrhea episodes as needed. Maximum 4 tablet per day. 60 capsule 11   ??? magnesium oxide-Mg AA chelate (MAGNESIUM, AMINO ACID CHELATE,) 133 mg Tab Take 1 tablet by mouth Two (2) times a day. HOLD until directed to start by your coordinator. 100 tablet 11   ??? MYFORTIC 180 mg EC tablet Take 3 tablets (540 mg) in the morning, 2 tablets (360 mg) in the afternoon, and 3 tablets (540 mg) at night 240 tablet 11   ??? sulfamethoxazole-trimethoprim (BACTRIM) 400-80 mg per tablet Take 1 tablet (80 mg of trimethoprim total) by mouth Every Monday, Wednesday, and Friday. 12 tablet 5   ??? tacrolimus (ENVARSUS XR) 1 mg Tb24 extended release tablet Take 1 (1mg ) tablet with 5 (4mg ) tablets by mouth daily. Total daily dose = 21mg . 30 tablet 11   ??? tacrolimus (ENVARSUS XR) 4 mg Tb24 extended release tablet Take 5 (4mg ) tablets with 1 (1mg ) tablet by mouth daily. Total daily dose = 21mg . 150 tablet 11   ??? traMADoL (ULTRAM) 50 mg tablet Take 1-2 tablets (50-100 mg total) by mouth two (2) times a day as needed for other (pain). 30 tablet 0   ??? valGANciclovir (VALCYTE) 450 mg tablet Take 1 tablet (450 mg total) by mouth Every Monday and Thursday. 30 tablet 2   ??? zolpidem (AMBIEN) 10 mg tablet Take 10 mg by mouth nightly as needed for sleep.       No current facility-administered medications for this visit.        ALLERGIES:  Patient has no known allergies.      Review of Systems  A 12 point review of systems was negative except for pertinent items noted in the HPI.           Physical Exam:   Blood pressure 149/95, pulse 93, temperature 36.4 ??C (97.5 ??F), temperature source Tympanic, height 175.3 cm (5' 9), weight 67.1 kg (148 lb).  Body mass index is 21.86 kg/m??.    General - no acute distress.  Neuro - alert, oriented, and interactive  Resp - breathing comfortably on room air  CV - sinus rhythm, hemodynamically normal/stable  Abd - soft, nondistended, nontender; healed incision LLQ, with staples intact. No erythema or drainage.  Ext - warm and well perfused    LABS:     Lab Results   Component Value Date    WBC 5.5 03/07/2019    HGB 8.6 (L) 03/07/2019    HCT 26.9 (L) 03/07/2019    PLT 268 03/07/2019     Lab Results   Component Value Date    NA 135 03/07/2019    K 4.2 03/07/2019    CL 96 (L) 03/07/2019    CO2 27.0 03/07/2019    BUN 33 (H) 03/07/2019    CREATININE 10.48 (H) 03/07/2019    CALCIUM 10.1 03/07/2019    MG 2.0 03/07/2019    PHOS 5.4 (H) 03/07/2019     Lab Results Component Value Date    APTT 32.6 02/20/2019     Lab Results   Component Value Date    ALKPHOS 65 03/02/2019    BILITOT 0.8 03/02/2019    BILIDIR 0.50 (H) 03/22/2018    PROT  7.2 03/02/2019    ALBUMIN 4.1 03/07/2019    ALT 32 03/02/2019    AST 20 03/02/2019    GGT 14 02/20/2019

## 2019-03-07 NOTE — Unmapped (Signed)
Dose change and increase on envarsus to 21mg  daily.  Patient is aware per coordinator note on 9/21.  Will adjust call accordingly.

## 2019-03-07 NOTE — Unmapped (Signed)
Pt advised to increase envarsus to 21 mg daily. Last level 7.3

## 2019-03-07 NOTE — Unmapped (Signed)
Pt seen in clinic today, UO and dialysis unit refused to leave a 1 L of fluid post treatment. Reviewed patient DGF status with Dr Margaretmary Bayley. Plan: No Biopsy 9/23,  patient will hold dialysis tomorrow 9/22 and start 40 mg lasix daily for 3 days. Pt advised to record UO and call if s/s of fluid overload develops.

## 2019-03-07 NOTE — Unmapped (Signed)
[facility] TRANSPLANT PHARMACY NOTE  03/07/2019   Russell Wade  [id number]    Medication changes today:   1. Start carvedilol 6.25 mg PO bid  2. Start ergocalciferol 50,000 units PO qweek x 8 weeks  3. Start imodium 2mg  PRN with diarrhea with Myfortic   4. Increase Envarsus to 21 mg PO qd  5. Start furosemide 40 mg daily x3 days    Education/Adherence tools provided today:  1.provided updated medication list  2. provided additional education on immunosuppression and transplant related medications including reviewing indications of medications, dosing and side effects    Follow up items:  1. goal of understanding indications and dosing of immunosuppression medications  2. Once Envarsus level is therapeutic may consider decreasing Myfortic back to 540 mg BID  3. Home BP readings  4. GI tolerance of Myfortic with immodium   5. Creatinine and Valcyte dose adjustment   6. Biopsy results  7. Aspirin at next visit    Next visit with pharmacy in 1-2 weeks  ____________________________________________________________________    Russell Wade is a 33 y.o. 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    Other PMH significant for parathyroidectomy with autotransplant    Post op course complicated by: DGF requiring maintenance on HD TTS    Seen by pharmacy today for: medication management and pill box fill and adherence education;last seen by pharmacy 1 weeks ago     CC:  Patient complains of frequent diarrhea with increased Myfortic dose and left leg numbess     Vitals:    03/07/19 0925   BP: 149/95   Pulse: 93   Temp: 36.4 ??C (97.5 ??F)     No Known Allergies    All medications reviewed and updated.     Medication list includes revisions made during today???s encounter    Outpatient Encounter Medications as of 03/07/2019   Medication Sig Dispense Refill   ??? acetaminophen (TYLENOL) 500 MG tablet Take 1-2 tablets (500-1,000 mg total) by mouth every six (6) hours as needed for pain or fever (> 38C). 100 tablet 0   ??? amLODIPine (NORVASC) 5 MG tablet Take 2 tablets (10 mg total) by mouth daily. 60 tablet 11   ??? aspirin (ECOTRIN) 81 MG tablet Take 1 tablet (81 mg total) by mouth daily. HOLD until directed to start in clinic. 30 tablet 11   ??? calcium acetate,phosphat bind, (PHOSLO) 667 mg capsule Take 2 capsules (1,334 mg total) by mouth Three (3) times a day with a meal. 180 capsule 11   ??? carvediloL (COREG) 6.25 MG tablet Take 1 tablet (6.25 mg total) by mouth Two (2) times a day. 60 tablet 0   ??? ergocalciferol (DRISDOL) 1,250 mcg (50,000 unit) capsule Take 1 capsule (50,000 Units total) by mouth once a week. 8 capsule 0   ??? hydrOXYzine (ATARAX) 25 MG tablet Take 1 tablet (25 mg total) by mouth every eight (8) hours as needed for itching. 30 tablet 11   ??? loperamide (IMODIUM) 2 mg capsule Take 4 mg (2 tablets) at first onset of diarrhea then 2 mg (1 tablet) with additional diarrhea episodes as needed. Maximum 4 tablet per day. 60 capsule 11   ??? magnesium oxide-Mg AA chelate (MAGNESIUM, AMINO ACID CHELATE,) 133 mg Tab Take 1 tablet by mouth Two (2) times a day. HOLD until directed to start by your coordinator. 100 tablet 11   ???  MYFORTIC 180 mg EC tablet Take 3 tablets (540 mg) in the morning, 2 tablets (360 mg) in the afternoon, and 3 tablets (540 mg) at night 240 tablet 11   ??? sulfamethoxazole-trimethoprim (BACTRIM) 400-80 mg per tablet Take 1 tablet (80 mg of trimethoprim total) by mouth Every Monday, Wednesday, and Friday. 12 tablet 5   ??? tacrolimus (ENVARSUS XR) 1 mg Tb24 extended release tablet Take four 1 mg tablets (4 mg) daily in addition to 4 mg tablets for total daily dose of 20 mg 120 tablet 11   ??? tacrolimus (ENVARSUS XR) 4 mg Tb24 extended release tablet Take 4 tablets (16 mg)  by mouth once daily.  Take in addition to 1 mg tablets for total daily dose of 20 mg daily. 120 tablet 11   ??? traMADoL (ULTRAM) 50 mg tablet Take 1-2 tablets (50-100 mg total) by mouth two (2) times a day as needed for other (pain). 30 tablet 0   ??? valGANciclovir (VALCYTE) 450 mg tablet Take 1 tablet (450 mg total) by mouth Every Monday and Thursday. 30 tablet 2   ??? zolpidem (AMBIEN) 10 mg tablet Take 10 mg by mouth nightly as needed for sleep.     ??? [DISCONTINUED] docusate sodium (COLACE) 100 MG capsule Take 1 capsule (100 mg total) by mouth two (2) times a day as needed for constipation. 60 capsule 0   ??? [DISCONTINUED] gabapentin (NEURONTIN) 100 MG capsule Take 1 capsule (100 mg total) by mouth nightly for 12 days. 12 capsule 0   ??? [DISCONTINUED] polyethylene glycol (MIRALAX) 17 gram packet MIx 1 packet (17 gm) in 8 oz of water, tea, coffee, or juice and drink by mouth daily as needed. 30 each 0     No facility-administered encounter medications on file as of 03/07/2019.      Induction agent : alemtuzumab    CURRENT IMMUNOSUPPRESSION: Envarsus 20 mg PO qd  prograf/Envarsus/cyclosporine goal: 8-10   myfortic540/360/540 mg PO tid    steroid free     Patient complains of diarrhea    IMMUNOSUPPRESSION DRUG LEVELS:  Lab Results   Component Value Date    Tacrolimus, Trough 2.8 (L) 03/02/2019    Tacrolimus, Trough 1.8 (L) 02/25/2019    Tacrolimus, Trough 3.0 (L) 02/23/2019    Tacrolimus, Trough <0.5 03/17/2014    Tacrolimus, Trough 7.7 04/05/2013    Tacrolimus, Trough 7.1 03/29/2013    Tacrolimus, Timed 7.3 03/07/2019     No results found for: CYCLO  No results found for: EVEROLIMUS  No results found for: SIROLIMUS    Prograf level is accurate 12 hour trough    Graft function: complicated by DGF  DSA: ntd  Biopsies to date: zero hour biopsy negative for diagnostic abnormalities  WBC/ANC:  wnl    Plan: Will increase Envarsus to 21 mg PO qd. Per Dr. Margaretmary Bayley, will hold HD tomorrow and give LSX 40 mg daily x3 days.  Continue to monitor.     OI Prophylaxis:   CMV Status: D+/ R+, moderate risk . CMV prophylaxis: valganciclovir 450 mg two days per week x 3 months per protocol.    Lab Results   Component Value Date    CMV Quant 1468 08/22/2008     PCP Prophylaxis: bactrim SS 1 tab MWF x 6 months.  Thrush: completed in hospital  Patient is  tolerating infectious prophylaxis well    Plan: Continue per protocol. Advised patient to take Valcyte after HD on dialysis days. Continue to monitor.    CV  Prophylaxis: asa 81 mg on hold for potential biopsy  The ASCVD Risk score Denman George DC Montez Hageman, et al., 2013) failed to calculate.  Statin therapy: Not indicated given age; currently on no statin  Plan: consider starting statin at a later date. Continue to monitor     BP: Goal < 140/90. Clinic vitals reported above  Home BP ranges:     Date AM BP PM BP   03/02/2019 143/99    03/03/2019 155/102    03/04/2019 149/102 158/108   03/05/2019 161/100 156/102   03/06/2019 149/99      Current meds include: amlodipine 10 mg daily  Plan: out of goal. Start carvedilol 6.25 mg PO bid. Continue to monitor    Anemia of CKD:  H/H:   Lab Results   Component Value Date    HGB 8.6 (L) 03/07/2019     Lab Results   Component Value Date    HCT 26.9 (L) 03/07/2019     Iron panel:  Lab Results   Component Value Date    IRON 17 (L) 03/21/2014    TIBC 110 (L) 03/21/2014    FERRITIN 1,960 (H) 03/21/2014     Lab Results   Component Value Date    Iron Saturation (%) 15 (L) 03/21/2014     Prior ESA use: Patient reports receiving at HD ~ 1x/month    Plan: stable. Continue to monitor.     DM:   Lab Results   Component Value Date    A1C 4.9 02/20/2019   . Goal A1c < 7  History of Dm? No  Diet:did not address. Patient reports eating small snack like peanut butter crackers before labs in AM.   Exercise: Walking daily  Hypoglycemia: no   Fluid intake: ~60 oz/day; UO ~ 513ml/day  Plan:  Continue to monitor    Electrolytes: Phos 5.4  Meds currently on: Phoslo 1334 mg TID AC  Plan: As kidney function improves can likely DC binder. Continue to monitor     GI/BM: pt reports diarrhea  Meds currently on: docusate PRN (not using), Miralax PRN (not using)  Plan: Take off docusate and miralax from medication list. Add imodium 2 mg PO PRN up to 4 doses per day. Continue to monitor    Pain: pt reports mild numbness in left leg.   Meds currently on: APAP PRN (using PRN), tramadol PRN (not using), gabapentin per protocol  Plan: Continue to monitor    Bone health:   Vitamin D Level: 9.6 on 03/02/19. Goal > 30.   Last DEXA results:  none available  Current meds include: none  Plan: Vitamin D level  out of goal,start ergocalciferol 50,000 units PO qWeek x 8 weeks. Continue to monitor.     Women's/Men's Health:  Russell Wade is a 33 y.o. male. Patient reports no men's/women's health issues  Plan: Continue to monitor    Insomnia  Meds currently on: zolpidem PRN (hasn't needed)  Plan: continue to monitor    Itching  Meds currently on: hydroxyzine PRN  Plan: continue to monitor    Pharmacy preference:  [facility] for Envarsus and Myfortic. [facility] for all other medications.     Adherence: Patient has average understanding of medications; was able to independently identify names/doses of immunosuppressants and OI meds.  Patient  does fill their own pill box on a regular basis at home.  Patient brought medication card:yes  Pill box:did not bring  Plan: provided extensive adherence counseling/intervention    Spent approximately 40  minutes on educating this patient and greater than 50% was spent in direct face to face counseling regarding post transplant medication education. Questions and concerns were address to patient's satisfaction.    Patient was reviewed with Dr. Rush Barer who was agreement with the stated plan:     During this visit, the following was completed:   BG log data assessment  BP log data assessment  Labs ordered and evaluated  complex treatment plan >1 DS   Patient education was completed for 11-24 minutes     All questions/concerns were addressed to the patient's satisfaction. __________________________________________  Samara Snide, PharmD  PGY1 Pharmacy Resident       Cleone Slim, PHARMD, CPP  [facility]ID [employer] TRANSPLANT CLINICAL PHARMACIST PRACTITIONER  PAGER [phone]

## 2019-03-08 LAB — VITAMIN D, TOTAL (25OH): Lab: 9.3 — ABNORMAL LOW

## 2019-03-09 ENCOUNTER — Other Ambulatory Visit (HOSPITAL_COMMUNITY)
Admission: AD | Admit: 2019-03-09 | Discharge: 2019-03-09 | Disposition: A | Discharge status: Home / Self Care | Payer: Medicare Other | Source: Ambulatory Visit | Attending: Nephrology | Admitting: Nephrology

## 2019-03-09 DIAGNOSIS — Z940 Kidney transplant status: Secondary | ICD-10-CM

## 2019-03-09 DIAGNOSIS — T8619 Other complication of kidney transplant: Secondary | ICD-10-CM

## 2019-03-09 DIAGNOSIS — E559 Vitamin D deficiency, unspecified: Secondary | ICD-10-CM | POA: Insufficient documentation

## 2019-03-09 DIAGNOSIS — N189 Chronic kidney disease, unspecified: Secondary | ICD-10-CM | POA: Diagnosis not present

## 2019-03-09 DIAGNOSIS — Z114 Encounter for screening for human immunodeficiency virus [HIV]: Secondary | ICD-10-CM | POA: Insufficient documentation

## 2019-03-09 DIAGNOSIS — Z789 Other specified health status: Secondary | ICD-10-CM | POA: Insufficient documentation

## 2019-03-09 DIAGNOSIS — Z09 Encounter for follow-up examination after completed treatment for conditions other than malignant neoplasm: Secondary | ICD-10-CM | POA: Diagnosis not present

## 2019-03-09 DIAGNOSIS — D899 Disorder involving the immune mechanism, unspecified: Secondary | ICD-10-CM | POA: Insufficient documentation

## 2019-03-09 DIAGNOSIS — E1129 Type 2 diabetes mellitus with other diabetic kidney complication: Secondary | ICD-10-CM | POA: Insufficient documentation

## 2019-03-09 DIAGNOSIS — N390 Urinary tract infection, site not specified: Secondary | ICD-10-CM | POA: Insufficient documentation

## 2019-03-09 DIAGNOSIS — Z79899 Other long term (current) drug therapy: Secondary | ICD-10-CM | POA: Insufficient documentation

## 2019-03-09 DIAGNOSIS — D631 Anemia in chronic kidney disease: Secondary | ICD-10-CM | POA: Insufficient documentation

## 2019-03-09 DIAGNOSIS — B259 Cytomegaloviral disease, unspecified: Secondary | ICD-10-CM | POA: Diagnosis not present

## 2019-03-09 DIAGNOSIS — Z9483 Pancreas transplant status: Secondary | ICD-10-CM | POA: Insufficient documentation

## 2019-03-09 DIAGNOSIS — T8610 Unspecified complication of kidney transplant: Secondary | ICD-10-CM | POA: Insufficient documentation

## 2019-03-09 LAB — CBC W/ DIFFERENTIAL
BASOPHILS ABSOLUTE COUNT: 0 10*9/L
BASOPHILS RELATIVE PERCENT: 1 %
EOSINOPHILS ABSOLUTE COUNT: 0.2 10*9/L
EOSINOPHILS RELATIVE PERCENT: 4 %
HEMATOCRIT: 23.9 % — ABNORMAL LOW
HEMOGLOBIN: 7.9 g/dL — ABNORMAL LOW
LYMPHOCYTES ABSOLUTE COUNT: 0 10*9/L — ABNORMAL LOW
LYMPHOCYTES RELATIVE PERCENT: 0 %
MEAN CORPUSCULAR HEMOGLOBIN: 27.1 pg
MEAN CORPUSCULAR VOLUME: 82.1 fL
MONOCYTES ABSOLUTE COUNT: 0.3 10*9/L
MONOCYTES RELATIVE PERCENT: 5 %
NEUTROPHILS ABSOLUTE COUNT: 5.7 10*9/L
NEUTROPHILS RELATIVE PERCENT: 90 %
PLATELET COUNT: 238 10*9/L
RED BLOOD CELL COUNT: 2.91 10*12/L — ABNORMAL LOW
RED CELL DISTRIBUTION WIDTH: 16.8 % — ABNORMAL HIGH
WBC ADJUSTED: 6.3 10*9/L

## 2019-03-09 LAB — BASIC METABOLIC PANEL
Anion gap: 13 (ref 5–15)
BLOOD UREA NITROGEN: 45 mg/dL — ABNORMAL HIGH
BUN: 45 mg/dL — ABNORMAL HIGH (ref 6–20)
CALCIUM: 9.8 mg/dL
CHLORIDE: 104 mmol/L
CO2: 24 mmol/L
CO2: 24 mmol/L (ref 22–32)
Calcium: 9.8 mg/dL (ref 8.9–10.3)
Chloride: 104 mmol/L (ref 98–111)
Creatinine, Ser: 13.72 mg/dL — ABNORMAL HIGH (ref 0.61–1.24)
EGFR CKD-EPI AA MALE: 5 mL/min/{1.73_m2} — ABNORMAL LOW
GFR calc Af Amer: 5 mL/min — ABNORMAL LOW (ref >60)
GFR calc non Af Amer: 4 mL/min — ABNORMAL LOW (ref >60)
Glucose, Bld: 103 mg/dL — ABNORMAL HIGH (ref 70–99)
POTASSIUM: 4.5 mmol/L
Potassium: 4.5 mmol/L (ref 3.5–5.1)
SODIUM: 141 mmol/L
Sodium: 141 mmol/L (ref 135–145)

## 2019-03-09 LAB — CO2: Lab: 24

## 2019-03-09 LAB — NUCLEATED RED BLOOD CELLS: Lab: 0

## 2019-03-09 LAB — PHOSPHORUS
Lab: 5.5 — ABNORMAL HIGH
Phosphorus: 5.5 mg/dL — ABNORMAL HIGH (ref 2.5–4.6)

## 2019-03-09 LAB — MAGNESIUM
Lab: 2
Magnesium: 2 mg/dL (ref 1.7–2.4)

## 2019-03-09 LAB — VITAMIN D 1,25-DIHYDROXY: 1,25-Dihydroxyvitamin D:MCnc:Pt:Ser/Plas:Qn:: <8 — ABNORMAL LOW

## 2019-03-09 LAB — CBC WITH DIFFERENTIAL/PLATELET
Abs Immature Granulocytes: 0.02 10*3/uL (ref 0.00–0.07)
Basophils Absolute: 0 10*3/uL (ref 0.0–0.1)
Basophils Relative: 1 %
Eosinophils Absolute: 0.2 10*3/uL (ref 0.0–0.5)
Eosinophils Relative: 4 %
HCT: 23.9 % — ABNORMAL LOW (ref 39.0–52.0)
Hemoglobin: 7.9 g/dL — ABNORMAL LOW (ref 13.0–17.0)
Immature Granulocytes: 0 %
Lymphocytes Relative: 0 %
Lymphs Abs: 0 10*3/uL — ABNORMAL LOW (ref 0.7–4.0)
MCH: 27.1 pg (ref 26.0–34.0)
MCHC: 33.1 g/dL (ref 30.0–36.0)
MCV: 82.1 fL (ref 80.0–100.0)
Monocytes Absolute: 0.3 10*3/uL (ref 0.1–1.0)
Monocytes Relative: 5 %
Neutro Abs: 5.7 10*3/uL (ref 1.7–7.7)
Neutrophils Relative %: 90 %
Platelets: 238 10*3/uL (ref 150–400)
RBC: 2.91 MIL/uL — ABNORMAL LOW (ref 4.22–5.81)
RDW: 16.8 % — ABNORMAL HIGH (ref 11.5–15.5)
WBC: 6.3 10*3/uL (ref 4.0–10.5)
nRBC: 0 % (ref 0.0–0.2)

## 2019-03-09 NOTE — Unmapped (Signed)
Pt advised to hold dialysis Thursday get labs Friday and Kidney Biopsy on Monday.

## 2019-03-09 NOTE — Unmapped (Signed)
US Renal Transplant Biopsy Note  Date of Biopsy Referral: 03/10/19  Referring Transplant Provider: Jackey Loge Pager: [phone] Phone: [phone]  Please contact Clelia Croft at [phone] for RUSH case otherwise contact referring provider.     (please handwrite contact information on day of biopsy)       Date of Scheduled Biopsy: 9/28/202  Rush?: YES  Patient Name: [name]  MR: [id number]  Age: 33 y.o.  Sex: Male Race: Black or African American Ethnicity:  African American  Procedures: Ultrasound Guided Percutaneous Kidney Biopsy under Moderate Sedation  Tissue Submitted: Kidney  Special Studies Required: LM, IF, EM    ----------------------------------------------------------------------------------------------------------------------  Date of allograft implantation: 96/2020  Underlying native kidney disease: Alport's Syndrome  Was the diagnosis established by biopsy? Unknown  Previous transplant biopsies?  NO  If yes, what were the previous diagnoses?   Previous kidney transplants: - If yes, this is #:   History/Clinical Diagnosis/Indication for Biopsy:   Particia Nearing, MD   Physician   Specialty:  Transplant Surgery   Progress Notes   Signed   Encounter Date:  03/07/2019               Expand All Collapse All      ??  ??Assessment/Plan:     Subjective [] Expand by Default   Russell Wade is a 33 y.o. male who underwent transplant on 02/20/2019 (Kidney), 11/12/2007 (Kidney). The patient is in clinic and after obtaining the HPI and reviewing the chart we addressed the following medical issues:   ??  ??  CLINIC RECOMMENDATIONS :  1. Immunosuppressive management: Envarsus adjusted to 21 mg qd, continuing with current Myfortic regimen  2. Lab frequency: 3x/week  3. Follow-up: will tentatively schedule patient for an outpatient kidney biopsy to evaluate DGF.  ??  ??  ______________________________________________________________________  ??  ??  ??  ??  HPI: [name] is a 33 y.o. male s/p kidney transplant who presents today for a follow up visit. With respect to his functional status, he denies significant SOB, chest pain, or other exertional symptoms. He states that his urine output is increasing and his 24-hour recordings vary between 300 - 500 ml. He denies lower extremity edema.  ??  ??  Past Medical History  ??  Past Medical History[] Expand by Default        Past Medical History:   Diagnosis Date   ??? Alport syndrome ??   ??? ESRD (end stage renal disease) (CMS-HCC) ??   ??? Hearing impaired ??   ??? History of transfusion ??   ??? Hypertension ??   ??? Nephrotic syndrome ??   ??? Renal transplant disorder ??        ??  ??  Past Surgical History  ??  Past Surgical History[] Expand by Default         Past Surgical History:   Procedure Laterality Date   ??? ABDOMINAL SURGERY ?? ??   ??? AV FISTULA PLACEMENT Left 2004   ?? left arm   ??? FRACTURE SURGERY ?? ??   ??? NEPHRECTOMY TRANSPLANTED ORGAN ?? 11-21-07   ?? Deceased donor renal transplant November 21, 2007, episode of cellular acute rejection 11/2009     ??? PR REMOVAL OF RECIPIENT KIDNEY Right 03/20/2014   ?? Procedure: RECIPIENT NEPHRECTOMY (SEPARATE PROCEDURE);  Surgeon: Vivi Barrack, MD;  Location: MAIN OR [facility];  Service: Transplant   ??? PR TRANSPLANT,PREP RENAL GRAFT/ARTERIAL N/A 02/20/2019   ?? Procedure: BACKBENCH RECONSTRUCTION CADAVER/LIVING DONOR RENAL ALLOGRAFT PRIOR TO  TRANSPLANT; ARTERIAL ANASTOMOSIS EAC;  Surgeon: Leona Carry, MD;  Location: MAIN OR [facility];  Service: Transplant   ??? PR TRANSPLANTATION OF KIDNEY Left 02/20/2019   ?? Procedure: RENAL ALLOTRANSPLANTATION, IMPLANTATION OF GRAFT; WITHOUT RECIPIENT NEPHRECTOMY;  Surgeon: Leona Carry, MD;  Location[facility]  Service: Transplant        ??  ??  MEDICATIONS:  Current Medications[] Expand by Default          Current Outpatient Medications   Medication Sig Dispense Refill   ??? acetaminophen (TYLENOL) 500 MG tablet Take 1-2 tablets (500-1,000 mg total) by mouth every six (6) hours as needed for pain or fever (> 38C). 100 tablet 0   ??? amLODIPine (NORVASC) 5 MG tablet Take 2 tablets (10 mg total) by mouth daily. 60 tablet 11   ??? aspirin (ECOTRIN) 81 MG tablet Take 1 tablet (81 mg total) by mouth daily. HOLD until directed to start in clinic. 30 tablet 11   ??? calcium acetate,phosphat bind, (PHOSLO) 667 mg capsule Take 2 capsules (1,334 mg total) by mouth Three (3) times a day with a meal. 180 capsule 11   ??? carvediloL (COREG) 6.25 MG tablet Take 1 tablet (6.25 mg total) by mouth Two (2) times a day. 60 tablet 0   ??? ergocalciferol (DRISDOL) 1,250 mcg (50,000 unit) capsule Take 1 capsule (50,000 Units total) by mouth once a week. 8 capsule 0   ??? furosemide (LASIX) 40 MG tablet Take 1 tablet (40 mg total) by mouth daily for 3 days. 3 tablet 0   ??? hydrOXYzine (ATARAX) 25 MG tablet Take 1 tablet (25 mg total) by mouth every eight (8) hours as needed for itching. 30 tablet 11   ??? loperamide (IMODIUM) 2 mg capsule Take 4 mg (2 tablets) at first onset of diarrhea then 2 mg (1 tablet) with additional diarrhea episodes as needed. Maximum 4 tablet per day. 60 capsule 11   ??? magnesium oxide-Mg AA chelate (MAGNESIUM, AMINO ACID CHELATE,) 133 mg Tab Take 1 tablet by mouth Two (2) times a day. HOLD until directed to start by your coordinator. 100 tablet 11   ??? MYFORTIC 180 mg EC tablet Take 3 tablets (540 mg) in the morning, 2 tablets (360 mg) in the afternoon, and 3 tablets (540 mg) at night 240 tablet 11   ??? sulfamethoxazole-trimethoprim (BACTRIM) 400-80 mg per tablet Take 1 tablet (80 mg of trimethoprim total) by mouth Every Monday, Wednesday, and Friday. 12 tablet 5   ??? tacrolimus (ENVARSUS XR) 1 mg Tb24 extended release tablet Take 1 (1mg ) tablet with 5 (4mg ) tablets by mouth daily. Total daily dose = 21mg . 30 tablet 11   ??? tacrolimus (ENVARSUS XR) 4 mg Tb24 extended release tablet Take 5 (4mg ) tablets with 1 (1mg ) tablet by mouth daily. Total daily dose = 21mg . 150 tablet 11   ??? traMADoL (ULTRAM) 50 mg tablet Take 1-2 tablets (50-100 mg total) by mouth two (2) times a day as needed for other (pain). 30 tablet 0   ??? valGANciclovir (VALCYTE) 450 mg tablet Take 1 tablet (450 mg total) by mouth Every Monday and Thursday. 30 tablet 2   ??? zolpidem (AMBIEN) 10 mg tablet Take 10 mg by mouth nightly as needed for sleep. ?? ??   ??  No current facility-administered medications for this visit.         ??  ALLERGIES:  Patient has no known allergies.  ??  ??  Review of Systems  A 12 point review  of systems was negative except for pertinent items noted in the HPI.  ??  ??  ??  ??  Physical Exam:   Blood pressure 149/95, pulse 93, temperature 36.4 ??C (97.5 ??F), temperature source Tympanic, height 175.3 cm (5' 9), weight 67.1 kg (148 lb).  Body mass index is 21.86 kg/m??.  ??  General - no acute distress.  Neuro - alert, oriented, and interactive  Resp - breathing comfortably on room air  CV - sinus rhythm, hemodynamically normal/stable  Abd - soft, nondistended, nontender; healed incision LLQ, with staples intact. No erythema or drainage.  Ext - warm and well perfused  ??  LABS:           Lab Results   Component Value Date   ?? WBC 5.5 03/07/2019   ?? HGB 8.6 (L) 03/07/2019   ?? HCT 26.9 (L) 03/07/2019   ?? PLT 268 03/07/2019   ??        Lab Results   Component Value Date   ?? NA 135 03/07/2019   ?? K 4.2 03/07/2019   ?? CL 96 (L) 03/07/2019   ?? CO2 27.0 03/07/2019   ?? BUN 33 (H) 03/07/2019   ?? CREATININE 10.48 (H) 03/07/2019   ?? CALCIUM 10.1 03/07/2019   ?? MG 2.0 03/07/2019   ?? PHOS 5.4 (H) 03/07/2019   ??        Lab Results   Component Value Date   ?? APTT 32.6 02/20/2019   ??        Lab Results   Component Value Date   ?? ALKPHOS 65 03/02/2019   ?? BILITOT 0.8 03/02/2019   ?? BILIDIR 0.50 (H) 03/22/2018   ?? PROT 7.2 03/02/2019   ?? ALBUMIN 4.1 03/07/2019   ?? ALT 32 03/02/2019   ?? AST 20 03/02/2019   ?? GGT 14 02/20/2019   ??  ??  ??  ??  ??  ??  ??  ??  ??      Electronically signed by Particia Nearing, MD at 03/07/19 1641 ----------------------------------------------------------------------------------------------------------------------  Current Baseline Immunosuppression: FK506/Tacrolimus  Specific anti-rejection treatment before biopsy: no   If yes, what was the type of treatment? NA  Patient off immunosuppression?: YES   Patient seems compliant? YES   Patient is currently back on hemodialysis? YES due to DGF  ----------------------------------------------------------------------------------------------------------------------  Evidence of allo-antibodies? NO  If yes, HLA type/MFI?:  Blood Pressure (mmHg):   BP Readings from Last 3 Encounters:   03/07/19 149/95   03/07/19 149/95   03/02/19 156/80     Urinalysis:   Lab Results   Component Value Date    Color, UA Light Yellow 03/07/2019    Color, UA RED 03/16/2014    Specific Gravity, UA 1.005 03/07/2019    Specific Gravity, UA 1.015 03/16/2014    pH, UA 7.0 03/07/2019    pH, UA 9.0 03/16/2014    Glucose, UA Negative 03/07/2019    Glucose, UA NEG 03/16/2014    Ketones, UA Negative 03/07/2019    Ketones, UA NEG 03/16/2014    Blood, UA Moderate (A) 03/07/2019    Blood, UA +4 03/16/2014    Nitrite, UA Negative 03/07/2019    Nitrite, UA NEG 03/16/2014    Leukocyte Esterase, UA Large (A) 03/07/2019    Leukocyte Esterase, UA : 03/16/2014    Urobilinogen, UA 0.2 mg/dL 81/19/1478    Urobilinogen, UA 1 03/16/2014    Bilirubin, UA Negative 03/07/2019    Bilirubin, UA NEG 03/16/2014  WBC, UA >100 (H) 03/16/2014    RBC, UA >100 (H) 03/16/2014     Urine protein/creatinine ratio: Results for DEMETRIC, DUNNAWAY (MRN [id number]) as of 03/14/2019 17:12   Ref. Range 03/07/2019 09:14   Creatinine, Urine Latest Ref Range: Undefined mg/dL 70.6   Protein Urine Random Latest Ref Range: Undefined mg/dL 23.7   Protein/Creatinine Ratio, Urine Latest Ref Range: Undefined  1.139     Creatinine (present peak): 15. 19 Creatinine (baseline): 9.6 (PT has DGF post Transplant  Lab Results   Component Value Date    CREATININE 13.72 (H) 03/09/2019    CREATININE 10.48 (H) 03/07/2019    CREATININE 9.82 (H) 03/02/2019    CREATININE 10.59 (H) 02/28/2019    CREATININE 7.23 (H) 02/25/2019     ----------------------------------------------------------------------------------------------------------------------  Clinical signs of infections at time of current biopsy: No  BK: NO:  BK blood viral load:n/a BK urine viral load: n/a  CMV:  NO  CMV viral load: n/a  Herpes:  NO:  Hepatitis B: NO  Hepatitis C: NO  Bacteria: no   Fungi: no   Urinary tract infection: NO:     ----------------------------------------------------------------------------------------------------------------------  Stenosis of renal artery:  NO  Obstruction of ureter:  NO   Lymphocele:  NO   ----------------------------------------------------------------------------------------------------------------------  Donor Information (if available)  Age of donor: unknown  Gender:   Race: unknown  Type of donor: DBD  Ischemia time: unknown  Delayed graft function: YES  If yes, how many days on hemodialysis: since transplant

## 2019-03-10 LAB — CMV COMMENT: Lab: 0

## 2019-03-10 LAB — CMV DNA, QUANTITATIVE, PCR

## 2019-03-10 LAB — TACROLIMUS LEVEL: Tacrolimus (FK506) - LabCorp: 8.8 ng/mL (ref 2.0–20.0)

## 2019-03-10 NOTE — Unmapped (Signed)
Pt Cr 13.72, report no SOB, edema, or  S/S of fluid overload.  Reviewed labs with Dr Margaretmary Bayley and advised him to [facility] dialysis Thursday, get dialyze Saturday and come in for Kidney Biopsy on  Monday 9/28 at 7 AM. Biopsy instructions given.

## 2019-03-11 ENCOUNTER — Other Ambulatory Visit (HOSPITAL_COMMUNITY)
Admission: RE | Admit: 2019-03-11 | Discharge: 2019-03-11 | Disposition: A | Discharge status: Home / Self Care | Payer: Medicare Other | Source: Ambulatory Visit | Attending: Family Medicine | Admitting: Family Medicine

## 2019-03-11 DIAGNOSIS — Z940 Kidney transplant status: Secondary | ICD-10-CM | POA: Diagnosis not present

## 2019-03-11 DIAGNOSIS — Z79899 Other long term (current) drug therapy: Secondary | ICD-10-CM | POA: Diagnosis not present

## 2019-03-11 LAB — CBC W/ DIFFERENTIAL
BASOPHILS ABSOLUTE COUNT: 0.1 10*9/L
BASOPHILS RELATIVE PERCENT: 1 %
EOSINOPHILS ABSOLUTE COUNT: 0.3 10*9/L
EOSINOPHILS RELATIVE PERCENT: 5 %
HEMATOCRIT: 23.1 % — ABNORMAL LOW
HEMOGLOBIN: 7.5 g/dL — ABNORMAL LOW
LYMPHOCYTES ABSOLUTE COUNT: 0 10*9/L — ABNORMAL LOW
LYMPHOCYTES RELATIVE PERCENT: 1 %
MEAN CORPUSCULAR HEMOGLOBIN CONC: 32.5 g/dL
MEAN CORPUSCULAR HEMOGLOBIN: 27.1 pg
MEAN CORPUSCULAR VOLUME: 83.4 fL
MONOCYTES RELATIVE PERCENT: 5 %
NEUTROPHILS ABSOLUTE COUNT: 5.7 10*9/L
NEUTROPHILS RELATIVE PERCENT: 88 %
PLATELET COUNT: 244 10*9/L
RED CELL DISTRIBUTION WIDTH: 17.2 % — ABNORMAL HIGH
WBC ADJUSTED: 6.4 10*9/L

## 2019-03-11 LAB — PHOSPHORUS
Lab: 6 — ABNORMAL HIGH
Phosphorus: 6 mg/dL — ABNORMAL HIGH (ref 2.5–4.6)

## 2019-03-11 LAB — MAGNESIUM
Lab: 1.9
Magnesium: 1.9 mg/dL (ref 1.7–2.4)

## 2019-03-11 LAB — BASIC METABOLIC PANEL
Anion gap: 13 (ref 5–15)
BLOOD UREA NITROGEN: 56 mg/dL — ABNORMAL HIGH
BUN: 56 mg/dL — ABNORMAL HIGH (ref 6–20)
CALCIUM: 9.5 mg/dL
CHLORIDE: 108 mmol/L
CO2: 22 mmol/L (ref 22–32)
Calcium: 9.5 mg/dL (ref 8.9–10.3)
Chloride: 108 mmol/L (ref 98–111)
Creatinine, Ser: 15.19 mg/dL — ABNORMAL HIGH (ref 0.61–1.24)
EGFR CKD-EPI AA MALE: 4 mL/min/{1.73_m2} — ABNORMAL LOW
GFR calc Af Amer: 4 mL/min — ABNORMAL LOW (ref >60)
GFR calc non Af Amer: 4 mL/min — ABNORMAL LOW (ref >60)
GLUCOSE RANDOM: 152 mg/dL — ABNORMAL HIGH
Glucose, Bld: 152 mg/dL — ABNORMAL HIGH (ref 70–99)
POTASSIUM: 5.4 mmol/L — ABNORMAL HIGH
Potassium: 5.4 mmol/L — ABNORMAL HIGH (ref 3.5–5.1)
SODIUM: 143 mmol/L
Sodium: 143 mmol/L (ref 135–145)

## 2019-03-11 LAB — CO2: Lab: 22

## 2019-03-11 LAB — EOSINOPHILS RELATIVE PERCENT: Lab: 5

## 2019-03-11 LAB — TACROLIMUS, TROUGH: Lab: 8.8

## 2019-03-11 LAB — CBC WITH DIFFERENTIAL/PLATELET
Abs Immature Granulocytes: 0.02 10*3/uL (ref 0.00–0.07)
Basophils Absolute: 0.1 10*3/uL (ref 0.0–0.1)
Basophils Relative: 1 %
Eosinophils Absolute: 0.3 10*3/uL (ref 0.0–0.5)
Eosinophils Relative: 5 %
HCT: 23.1 % — ABNORMAL LOW (ref 39.0–52.0)
Hemoglobin: 7.5 g/dL — ABNORMAL LOW (ref 13.0–17.0)
Immature Granulocytes: 0 %
Lymphocytes Relative: 1 %
Lymphs Abs: 0 10*3/uL — ABNORMAL LOW (ref 0.7–4.0)
MCH: 27.1 pg (ref 26.0–34.0)
MCHC: 32.5 g/dL (ref 30.0–36.0)
MCV: 83.4 fL (ref 80.0–100.0)
Monocytes Absolute: 0.3 10*3/uL (ref 0.1–1.0)
Monocytes Relative: 5 %
Neutro Abs: 5.7 10*3/uL (ref 1.7–7.7)
Neutrophils Relative %: 88 %
Platelets: 244 10*3/uL (ref 150–400)
RBC: 2.77 MIL/uL — ABNORMAL LOW (ref 4.22–5.81)
RDW: 17.2 % — ABNORMAL HIGH (ref 11.5–15.5)
WBC: 6.4 10*3/uL (ref 4.0–10.5)
nRBC: 0 % (ref 0.0–0.2)

## 2019-03-11 MED ORDER — ASPIRIN 81 MG TABLET,DELAYED RELEASE
ORAL_TABLET | 11 refills | 0 days
Start: 2019-03-11 — End: ?

## 2019-03-12 DIAGNOSIS — Z23 Encounter for immunization: Secondary | ICD-10-CM | POA: Diagnosis not present

## 2019-03-12 DIAGNOSIS — D509 Iron deficiency anemia, unspecified: Secondary | ICD-10-CM | POA: Diagnosis not present

## 2019-03-12 DIAGNOSIS — Z992 Dependence on renal dialysis: Secondary | ICD-10-CM | POA: Diagnosis not present

## 2019-03-12 DIAGNOSIS — N186 End stage renal disease: Secondary | ICD-10-CM | POA: Diagnosis not present

## 2019-03-12 DIAGNOSIS — N2581 Secondary hyperparathyroidism of renal origin: Secondary | ICD-10-CM | POA: Diagnosis not present

## 2019-03-12 DIAGNOSIS — D631 Anemia in chronic kidney disease: Secondary | ICD-10-CM | POA: Diagnosis not present

## 2019-03-12 LAB — TACROLIMUS LEVEL: Tacrolimus (FK506) - LabCorp: 8.1 ng/mL (ref 2.0–20.0)

## 2019-03-14 ENCOUNTER — Ambulatory Visit: Admit: 2019-03-14 | Discharge: 2019-03-15 | Payer: MEDICARE

## 2019-03-14 DIAGNOSIS — I120 Hypertensive chronic kidney disease with stage 5 chronic kidney disease or end stage renal disease: Secondary | ICD-10-CM

## 2019-03-14 DIAGNOSIS — N186 End stage renal disease: Secondary | ICD-10-CM

## 2019-03-14 DIAGNOSIS — Z4822 Encounter for aftercare following kidney transplant: Secondary | ICD-10-CM

## 2019-03-14 DIAGNOSIS — R944 Abnormal results of kidney function studies: Secondary | ICD-10-CM

## 2019-03-14 DIAGNOSIS — N179 Acute kidney failure, unspecified: Secondary | ICD-10-CM

## 2019-03-14 DIAGNOSIS — Z87891 Personal history of nicotine dependence: Secondary | ICD-10-CM

## 2019-03-14 DIAGNOSIS — T8619 Other complication of kidney transplant: Secondary | ICD-10-CM

## 2019-03-14 DIAGNOSIS — Z940 Kidney transplant status: Secondary | ICD-10-CM

## 2019-03-14 DIAGNOSIS — H9190 Unspecified hearing loss, unspecified ear: Secondary | ICD-10-CM

## 2019-03-14 DIAGNOSIS — T8691 Unspecified transplanted organ and tissue rejection: Secondary | ICD-10-CM

## 2019-03-14 DIAGNOSIS — T8611 Kidney transplant rejection: Secondary | ICD-10-CM | POA: Diagnosis not present

## 2019-03-14 DIAGNOSIS — R7989 Other specified abnormal findings of blood chemistry: Secondary | ICD-10-CM | POA: Diagnosis not present

## 2019-03-14 LAB — CBC W/ AUTO DIFF
BASOPHILS ABSOLUTE COUNT: 0 10*9/L (ref 0.0–0.1)
BASOPHILS RELATIVE PERCENT: 0.5 %
EOSINOPHILS ABSOLUTE COUNT: 0.3 10*9/L (ref 0.0–0.4)
HEMATOCRIT: 28.2 % — ABNORMAL LOW (ref 41.0–53.0)
HEMOGLOBIN: 8.9 g/dL — ABNORMAL LOW (ref 13.5–17.5)
LARGE UNSTAINED CELLS: 2 % (ref 0–4)
LYMPHOCYTES ABSOLUTE COUNT: 0.1 10*9/L — ABNORMAL LOW (ref 1.5–5.0)
LYMPHOCYTES RELATIVE PERCENT: 3.4 %
MEAN CORPUSCULAR HEMOGLOBIN CONC: 31.6 g/dL (ref 31.0–37.0)
MEAN CORPUSCULAR HEMOGLOBIN: 26.4 pg (ref 26.0–34.0)
MEAN PLATELET VOLUME: 8.8 fL (ref 7.0–10.0)
MONOCYTES ABSOLUTE COUNT: 0.2 10*9/L (ref 0.2–0.8)
MONOCYTES RELATIVE PERCENT: 4.4 %
NEUTROPHILS ABSOLUTE COUNT: 3.1 10*9/L (ref 2.0–7.5)
NEUTROPHILS RELATIVE PERCENT: 82.9 %
PLATELET COUNT: 277 10*9/L (ref 150–440)
RED BLOOD CELL COUNT: 3.39 10*12/L — ABNORMAL LOW (ref 4.50–5.90)
RED CELL DISTRIBUTION WIDTH: 19.3 % — ABNORMAL HIGH (ref 12.0–15.0)
WBC ADJUSTED: 3.7 10*9/L — ABNORMAL LOW (ref 4.5–11.0)

## 2019-03-14 LAB — CBC
HEMOGLOBIN: 7.7 g/dL — ABNORMAL LOW (ref 13.5–17.5)
HEMOGLOBIN: 8.3 g/dL — ABNORMAL LOW (ref 13.5–17.5)
MEAN CORPUSCULAR HEMOGLOBIN CONC: 32.7 g/dL (ref 31.0–37.0)
MEAN CORPUSCULAR HEMOGLOBIN CONC: 32.1 g/dL (ref 31.0–37.0)
MEAN CORPUSCULAR HEMOGLOBIN: 27 pg (ref 26.0–34.0)
MEAN CORPUSCULAR HEMOGLOBIN: 26.7 pg (ref 26.0–34.0)
MEAN CORPUSCULAR VOLUME: 82.7 fL (ref 80.0–100.0)
MEAN CORPUSCULAR VOLUME: 82.9 fL (ref 80.0–100.0)
MEAN PLATELET VOLUME: 8.6 fL (ref 7.0–10.0)
MEAN PLATELET VOLUME: 8.6 fL (ref 7.0–10.0)
PLATELET COUNT: 222 10*9/L (ref 150–440)
PLATELET COUNT: 247 10*9/L (ref 150–440)
RED BLOOD CELL COUNT: 2.86 10*12/L — ABNORMAL LOW (ref 4.50–5.90)
RED BLOOD CELL COUNT: 3.12 10*12/L — ABNORMAL LOW (ref 4.50–5.90)
RED CELL DISTRIBUTION WIDTH: 19.7 % — ABNORMAL HIGH (ref 12.0–15.0)
RED CELL DISTRIBUTION WIDTH: 19.5 % — ABNORMAL HIGH (ref 12.0–15.0)
WBC ADJUSTED: 2.5 10*9/L — ABNORMAL LOW (ref 4.5–11.0)
WBC ADJUSTED: 2.4 10*9/L — ABNORMAL LOW (ref 4.5–11.0)

## 2019-03-14 LAB — MEAN PLATELET VOLUME: Lab: 8.6

## 2019-03-14 LAB — BASIC METABOLIC PANEL
ANION GAP: 18 mmol/L — ABNORMAL HIGH (ref 7–15)
BLOOD UREA NITROGEN: 38 mg/dL — ABNORMAL HIGH (ref 7–21)
BUN / CREAT RATIO: 4
CALCIUM: 10.3 mg/dL — ABNORMAL HIGH (ref 8.5–10.2)
CHLORIDE: 99 mmol/L (ref 98–107)
CREATININE: 9.61 mg/dL — ABNORMAL HIGH (ref 0.70–1.30)
EGFR CKD-EPI NON-AA MALE: 6 mL/min/{1.73_m2} — ABNORMAL LOW (ref >=60)
GLUCOSE RANDOM: 95 mg/dL (ref 70–99)
SODIUM: 143 mmol/L (ref 135–145)

## 2019-03-14 LAB — PROTIME: Coagulation tissue factor induced:Time:Pt:PPP:Qn:Coag: 12.1

## 2019-03-14 LAB — HEPARIN CORRELATION: Lab: 0.2

## 2019-03-14 LAB — MEAN CORPUSCULAR HEMOGLOBIN CONC: Lab: 32.1

## 2019-03-14 LAB — TACROLIMUS, TROUGH: Lab: 8.1

## 2019-03-14 LAB — APTT: APTT: 31.9 s (ref 25.3–37.1)

## 2019-03-14 LAB — SODIUM: Sodium:SCnc:Pt:Ser/Plas:Qn:: 143

## 2019-03-14 LAB — MONOCYTES ABSOLUTE COUNT: Monocytes:NCnc:Pt:Bld:Qn:Automated count: 0.2

## 2019-03-14 LAB — PROTIME-INR: INR: 1.05

## 2019-03-14 MED ORDER — MYFORTIC 180 MG TABLET,DELAYED RELEASE
ORAL_TABLET | Freq: Two times a day (BID) | ORAL | 11 refills | 30 days | Status: CP
Start: 2019-03-14 — End: 2019-03-15
  Filled 2019-03-16: qty 240, 30d supply, fill #0

## 2019-03-14 MED ORDER — GABAPENTIN 100 MG CAPSULE
ORAL_CAPSULE | Freq: Every evening | ORAL | 0 refills | 12.00000 days | Status: CP
Start: 2019-03-14 — End: 2019-04-02
  Filled 2019-03-21: qty 12, 12d supply, fill #0

## 2019-03-14 MED ORDER — ACETAMINOPHEN 500 MG TABLET
ORAL_TABLET | Freq: Four times a day (QID) | ORAL | 11 refills | 13 days | Status: CP | PRN
Start: 2019-03-14 — End: 2020-03-13
  Filled 2019-03-21: qty 100, 13d supply, fill #0

## 2019-03-14 NOTE — Unmapped (Signed)
[facility] Clinical Assessment & Refill Coordination Note    Russell Wade, DOB: May 12, 1986  Phone: [phone] (home) [phone] (work)    All above HIPAA information was verified with patient.     Specialty Medication(s):   Transplant: Envarsus 1mg , Envarsus 4mg , Myfortic 180mg  and valgancyclovir 450mg      Current Outpatient Medications   Medication Sig Dispense Refill   ??? acetaminophen (TYLENOL) 500 MG tablet Take 1-2 tablets (500-1,000 mg total) by mouth every six (6) hours as needed for pain or fever (> 38C). 100 tablet 0   ??? amLODIPine (NORVASC) 5 MG tablet Take 2 tablets (10 mg total) by mouth daily. 60 tablet 11   ??? aspirin (ECOTRIN) 81 MG tablet HOLD until directed to start in clinic. 30 tablet 11   ??? calcium acetate,phosphat bind, (PHOSLO) 667 mg capsule Take 2 capsules (1,334 mg total) by mouth Three (3) times a day with a meal. 180 capsule 11   ??? carvediloL (COREG) 6.25 MG tablet Take 1 tablet (6.25 mg total) by mouth Two (2) times a day. 60 tablet 0   ??? ergocalciferol (DRISDOL) 1,250 mcg (50,000 unit) capsule Take 1 capsule (50,000 Units total) by mouth once a week. 8 capsule 0   ??? furosemide (LASIX) 40 MG tablet Take 1 tablet (40 mg total) by mouth daily for 3 days. 3 tablet 0   ??? hydrOXYzine (ATARAX) 25 MG tablet Take 1 tablet (25 mg total) by mouth every eight (8) hours as needed for itching. 30 tablet 11   ??? loperamide (IMODIUM) 2 mg capsule Take 4 mg (2 tablets) at first onset of diarrhea then 2 mg (1 tablet) with additional diarrhea episodes as needed. Maximum 4 tablet per day. 60 capsule 11   ??? magnesium oxide-Mg AA chelate (MAGNESIUM, AMINO ACID CHELATE,) 133 mg Tab Take 1 tablet by mouth Two (2) times a day. HOLD until directed to start by your coordinator. 100 tablet 11   ??? MYFORTIC 180 mg EC tablet Take 3 tablets (540 mg) in the morning, 2 tablets (360 mg) in the afternoon, and 3 tablets (540 mg) at night 240 tablet 11   ??? sulfamethoxazole-trimethoprim (BACTRIM) 400-80 mg per tablet Take 1 tablet (80 mg of trimethoprim total) by mouth Every Monday, Wednesday, and Friday. 12 tablet 5   ??? tacrolimus (ENVARSUS XR) 1 mg Tb24 extended release tablet Take 1 (1mg ) tablet with 5 (4mg ) tablets by mouth daily. Total daily dose = 21mg . 30 tablet 11   ??? tacrolimus (ENVARSUS XR) 4 mg Tb24 extended release tablet Take 5 (4mg ) tablets with 1 (1mg ) tablet by mouth daily. Total daily dose = 21mg . 150 tablet 11   ??? traMADoL (ULTRAM) 50 mg tablet Take 1-2 tablets (50-100 mg total) by mouth two (2) times a day as needed for other (pain). 30 tablet 0   ??? valGANciclovir (VALCYTE) 450 mg tablet Take 1 tablet (450 mg total) by mouth Every Monday and Thursday. 30 tablet 2   ??? zolpidem (AMBIEN) 10 mg tablet Take 10 mg by mouth nightly as needed for sleep.       No current facility-administered medications for this visit.      Facility-Administered Medications Ordered in Other Visits   Medication Dose Route Frequency Provider Last Rate Last Dose   ??? oxyCODONE-acetaminophen (PERCOCET) 5-325 mg tablet 1 tablet  1 tablet Oral Once Sherene Sires, MD            Changes to medications: see below about  specialty meds.     No Known Allergies    Changes to allergies: No    SPECIALTY MEDICATION ADHERENCE     envarsus 1mg   : 3 weeks of medicine on hand   envarsus 4mg   : 3 weeks of medicine on hand   Myfortic 180mg   : 5 days of medicine on hand   valganciclovir 450mg   : 10 days of medicine on hand     Medication Adherence    Patient reported X missed doses in the last month: 0  Specialty Medication: envarsus 1mg   Patient is on additional specialty medications: Yes  Additional Specialty Medications: envarsus 4mg   Patient Reported Additional Medication X Missed Doses in the Last Month: 0  Patient is on more than two specialty medications: Yes  Specialty Medication: myfortic 180mg   Patient Reported Additional Medication X Missed Doses in the Last Month: 0  Specialty Medication: valganciclovir 450mg   Patient Reported Additional Medication X Missed Doses in the Last Month: 0          Specialty medication(s) dose(s) confirmed: envarsus is now 21mg  daily -new rxs on file and pt is aware. myfortic is now 3/2/3 - pt aware, requesting new rx today, valgan is now 1 tab 2 times per week- pt aware and new rx on file     Are there any concerns with adherence? No    Adherence counseling provided? Not needed    CLINICAL MANAGEMENT AND INTERVENTION      Clinical Benefit Assessment:    Do you feel the medicine is effective or helping your condition? Yes    Clinical Benefit counseling provided? Not needed    Adverse Effects Assessment:    Are you experiencing any side effects? Yes, patient reports experiencing diarrhea still. Side effect counseling provided: he has been talking to clinic about this, i will message as well to clinic    Are you experiencing difficulty administering your medicine? No    Quality of Life Assessment:    How many days over the past month did your transplant  keep you from your normal activities? For example, brushing your teeth or getting up in the morning. new transplant so no days normal but doing well    Have you discussed this with your provider? Not needed    Therapy Appropriateness:    Is therapy appropriate? Yes, therapy is appropriate and should be continued    DISEASE/MEDICATION-SPECIFIC INFORMATION      N/A    PATIENT SPECIFIC NEEDS     ? Does the patient have any physical, cognitive, or cultural barriers? No    ? Is the patient high risk? Yes, patient taking a REMS drug     ? Does the patient require a Care Management Plan? No     ? Does the patient require physician intervention or other additional services (i.e. nutrition, smoking cessation, social work)? No      SHIPPING     Specialty Medication(s) to be Shipped:   Transplant: Myfortic 180mg  and valgancyclovir 450mg     Other medication(s) to be shipped: bactrim, amlodipine, apap, gabapentin - 5rx for 10/6 and 1rx for 10/1     Changes to insurance: No    Delivery Scheduled: Yes, Expected medication delivery date: 10/1 for myfortic, 10/6 for all other rxs.  However, Rx request for refills was sent to the provider as there are none remaining.     Medication will be delivered via UPS to the confirmed home address in [place].    The  patient will receive a drug information handout for each medication shipped and additional FDA Medication Guides as required.  Verified that patient has previously received a Conservation officer, historic buildings.    All of the patient's questions and concerns have been addressed.    [name]   [facility] Specialty Pharmacist

## 2019-03-14 NOTE — Unmapped (Signed)
Waiting on path to arrive to analyze first 2 samples before taking any additional.

## 2019-03-14 NOTE — Unmapped (Signed)
Path arrived, analyzing samples

## 2019-03-14 NOTE — Unmapped (Signed)
Assessment/Plan:    Russell Wade is a 33 y.o. male who will undergo Ultrasound-guided transplant kidney biopsy    1. Indications and risks/benefits of procedure reviewed with patient.    2. Consent signed and present on patient's charge.   3. No cardiopulmonary or other medical contraindications present therefore will proceed with biopsy.   4. Surgical pathology has been consulted  5. BUN is 38 today, given the low risk of Uremic platelet dysfunction, the patient will/will not receive DDAVP      CC: [place] Biopsy Indication: Elevated Creatinine/Acute Kidney Injury    HPI: Russell Wade is a 33 y.o. male who will undergo Ultrasound-guided transplant kidney biopsy with moderate sedation.    Allergies:  Patient has no known allergies.    Medications:   Prior to Admission medications    Medication Sig Start Date End Date Taking? Authorizing Provider   acetaminophen (TYLENOL) 500 MG tablet Take 1-2 tablets (500-1,000 mg total) by mouth every six (6) hours as needed for pain or fever (> 38C). 02/22/19   Drake Leach, PA   amLODIPine (NORVASC) 5 MG tablet Take 2 tablets (10 mg total) by mouth daily. 02/22/19 02/22/20  Drake Leach, PA   aspirin (ECOTRIN) 81 MG tablet HOLD until directed to start in clinic. 03/11/19   Trevor Iha, DO   calcium acetate,phosphat bind, (PHOSLO) 667 mg capsule Take 2 capsules (1,334 mg total) by mouth Three (3) times a day with a meal. 03/03/19 03/02/20  Wallace Cullens Mincemoyer, CPP   carvediloL (COREG) 6.25 MG tablet Take 1 tablet (6.25 mg total) by mouth Two (2) times a day. 03/07/19 03/06/20  Wallace Cullens Mincemoyer, CPP   ergocalciferol (DRISDOL) 1,250 mcg (50,000 unit) capsule Take 1 capsule (50,000 Units total) by mouth once a week. 03/07/19 03/06/20  Wallace Cullens Mincemoyer, CPP   furosemide (LASIX) 40 MG tablet Take 1 tablet (40 mg total) by mouth daily for 3 days. 03/07/19 03/10/19  Clayborne Artist, MD   hydrOXYzine (ATARAX) 25 MG tablet Take 1 tablet (25 mg total) by mouth every eight (8) hours as needed for itching. 03/02/19 03/01/20  Heather Brigitte Pulse, AGNP   loperamide (IMODIUM) 2 mg capsule Take 4 mg (2 tablets) at first onset of diarrhea then 2 mg (1 tablet) with additional diarrhea episodes as needed. Maximum 4 tablet per day. 03/07/19   Wallace Cullens Mincemoyer, CPP   magnesium oxide-Mg AA chelate (MAGNESIUM, AMINO ACID CHELATE,) 133 mg Tab Take 1 tablet by mouth Two (2) times a day. HOLD until directed to start by your coordinator. 02/22/19   Belva Chimes Vonderau, PA   MYFORTIC 180 mg EC tablet Take 3 tablets (540 mg) in the morning, 2 tablets (360 mg) in the afternoon, and 3 tablets (540 mg) at night 03/02/19   Clayborne Artist, MD   sulfamethoxazole-trimethoprim (BACTRIM) 400-80 mg per tablet Take 1 tablet (80 mg of trimethoprim total) by mouth Every Monday, Wednesday, and Friday. 02/23/19 08/22/19  Drake Leach, PA   tacrolimus (ENVARSUS XR) 1 mg Tb24 extended release tablet Take 1 (1mg ) tablet with 5 (4mg ) tablets by mouth daily. Total daily dose = 21mg . 03/07/19   Trevor Iha, DO   tacrolimus (ENVARSUS XR) 4 mg Tb24 extended release tablet Take 5 (4mg ) tablets with 1 (1mg ) tablet by mouth daily. Total daily dose = 21mg . 03/07/19   Trevor Iha, DO   traMADoL (ULTRAM) 50 mg tablet Take 1-2 tablets (50-100 mg total) by mouth two (2) times  a day as needed for other (pain). 02/22/19   Drake Leach, PA   valGANciclovir (VALCYTE) 450 mg tablet Take 1 tablet (450 mg total) by mouth Every Monday and Thursday. 02/24/19 05/25/19  Drake Leach, PA   zolpidem (AMBIEN) 10 mg tablet Take 10 mg by mouth nightly as needed for sleep.    Historical Provider, MD       Medical History:  Past Medical History:   Diagnosis Date   ??? Alport syndrome    ??? ESRD (end stage renal disease) (CMS-HCC)    ??? Hearing impaired    ??? History of transfusion    ??? Hypertension    ??? Nephrotic syndrome    ??? Renal transplant disorder        Surgical History:  Past Surgical History:   Procedure Laterality Date   ??? ABDOMINAL SURGERY     ??? AV FISTULA PLACEMENT Left 2004    left arm   ??? FRACTURE SURGERY     ??? NEPHRECTOMY TRANSPLANTED ORGAN  Nov 08, 2007    Deceased donor renal transplant 2007-11-08, episode of cellular acute rejection 11/2009     ??? PR REMOVAL OF RECIPIENT KIDNEY Right 03/20/2014    Procedure: RECIPIENT NEPHRECTOMY (SEPARATE PROCEDURE);  Surgeon: Vivi Barrack, MD;  Location: MAIN OR [facility];  Service: Transplant   ??? PR TRANSPLANT,PREP RENAL GRAFT/ARTERIAL N/A 02/20/2019    Procedure: [facility] RECONSTRUCTION CADAVER/LIVING DONOR RENAL ALLOGRAFT PRIOR TO TRANSPLANT; ARTERIAL ANASTOMOSIS EAC;  Surgeon: Leona Carry, MD;  Location: MAIN OR [facility];  Service: Transplant   ??? PR TRANSPLANTATION OF KIDNEY Left 02/20/2019    Procedure: RENAL ALLOTRANSPLANTATION, IMPLANTATION OF GRAFT; WITHOUT RECIPIENT NEPHRECTOMY;  Surgeon: Leona Carry, MD;  Location: MAIN OR [facility];  Service: Transplant       Social History:  Tobacco use:   reports that he quit smoking about 4 years ago. His smoking use included cigarettes. He has a 0.13 pack-year smoking history. He has never used smokeless tobacco.  Alcohol use:   reports no history of alcohol use.  Drug use:  reports no history of drug use.    Family History:  Patient denies significant family history.    ROS:  10 systems reviewed and are negative unless otherwise mentioned in HPI    ASA Grade: ASA 3 - Patient with moderate systemic disease with functional limitations      PE:    Vitals:    03/14/19 0730   BP: 153/99   Pulse: 99   Temp: 36.9 ??C   SpO2: 100%     General:  Well appearing male in NAD.  Airway assessment: Class 2 - Can visualize soft palate and fauces, tip of uvula is obscured  Cardiovascular:  Regular rate and rhythm.  No murmurs, gallops or rubs.   Lungs: respirations nonlabored; clear to auscultation bilaterally

## 2019-03-14 NOTE — Unmapped (Signed)
[facility]   Patient Onboarding/Medication Counseling    [name] is a 33 y.o. male with kidney transplant who I am counseling today on continuation of therapy.  I am speaking to the patient.    Verified patient's date of birth / HIPAA.    Specialty medication(s) to be sent: none      Non-specialty medications/supplies to be sent: none      Medications not needed at this time: none         Envarsus (tacrolimus XR)    Medication & Administration     Dosage: using 1mg  and 4mg  tablets: take 21mg  total by mouth daily (5x4mg  tablets and 1x1mg  tablets at a time)     Administration:   ? Take in the morning on empty stomach ??? 1 hour before or 2 hours after breakfast  ? Take with drink of water  ? Do not crush, break, or chew tablets    Adherence/Missed dose instructions:  ? Take a missed dose as soon as you think about it.  ? If it has been more than 15 hours since missed dose, skip the missed dose and go back to your regular time  ? Report all missed doses to transplant coordinator    Goals of Therapy     ? To prevent organ rejection    Side Effects & Monitoring Parameters     ? Common side effects  ? Fatigue  ? Headache  ? Stuffy nose or sore throat  ? Nausea, vomiting, stomach pain, diarrhea, constipation  ? Heartburn  ? Back or joint pain  ? Increased risk of infection    ? The following side effects should be reported to the provider:  ? Allergic reaction  ? Kidney issues (change in quantity or urine passed, blood in urine, or weight gain)  ? High blood pressure (dizziness, change in eyesight, headache)  ? Electrolyte issues (change in mood, confusion, muscle pain, or weakness)  ? Abnormal breathing  ? Shakiness  ? Unexplained bleeding or bruising (gums bleeding, blood in urine, nosebleeds, any abnormal bleeding)  ? Signs of infection    ? Monitoring Parameters  ? Renal function  ? Liver function  ? Glucose levels  ? Blood pressure  ? Tacrolimus trough levels      Contraindications, Warnings, & Precautions     ? Black Box Warning: Infections - immunosuppressant agents increase the risk of infection that may lead to hospitalization or death  ? Black Box Warning: Malignancy - immunosuppressant agents may be associated with the development of malignancies that may lead to hospitalization or death.  Limit or avoid sun and ultraviolet light exposure, use appropriate sun protection  ? Myocardial hypertrophy -avoid use in patients with congenital long QT syndrome  ? Diabetes mellitus - the risk for new-onset diabetes and insulin-dependent post-transplant diabetes mellitus is increased with tacrolimus use after transplantation  ? GI perforation  ? Hyperkalemia  ? Hypertension  ? Nephrotoxicity  ? Neurotoxicity    Drug/Food Interactions     ? Medication list reviewed in Epic. no interactions noted that clinic is not already monitoring.   ? Due to amount and severity of drug interactions, report ALL medications starts, discontinuations, and changes to transplant coordinator prior to making the change  ? Avoid alcohol  ? Avoid grapefruit or grapefruit juice  ? Avoid live vaccines    Storage, Handling Precautions, & Disposal     ? Store at room temperature  ? Keep away from children  and pets      Current Medications (including OTC/herbals), Comorbidities and Allergies     Current Outpatient Medications   Medication Sig Dispense Refill   ??? acetaminophen (TYLENOL) 500 MG tablet Take 1-2 tablets (500-1,000 mg total) by mouth every six (6) hours as needed for pain or fever (> 38C). 100 tablet 0   ??? amLODIPine (NORVASC) 5 MG tablet Take 2 tablets (10 mg total) by mouth daily. 60 tablet 11   ??? aspirin (ECOTRIN) 81 MG tablet HOLD until directed to start in clinic. 30 tablet 11   ??? calcium acetate,phosphat bind, (PHOSLO) 667 mg capsule Take 2 capsules (1,334 mg total) by mouth Three (3) times a day with a meal. 180 capsule 11   ??? carvediloL (COREG) 6.25 MG tablet Take 1 tablet (6.25 mg total) by mouth Two (2) times a day. 60 tablet 0   ??? ergocalciferol (DRISDOL) 1,250 mcg (50,000 unit) capsule Take 1 capsule (50,000 Units total) by mouth once a week. 8 capsule 0   ??? furosemide (LASIX) 40 MG tablet Take 1 tablet (40 mg total) by mouth daily for 3 days. 3 tablet 0   ??? hydrOXYzine (ATARAX) 25 MG tablet Take 1 tablet (25 mg total) by mouth every eight (8) hours as needed for itching. 30 tablet 11   ??? loperamide (IMODIUM) 2 mg capsule Take 4 mg (2 tablets) at first onset of diarrhea then 2 mg (1 tablet) with additional diarrhea episodes as needed. Maximum 4 tablet per day. 60 capsule 11   ??? magnesium oxide-Mg AA chelate (MAGNESIUM, AMINO ACID CHELATE,) 133 mg Tab Take 1 tablet by mouth Two (2) times a day. HOLD until directed to start by your coordinator. 100 tablet 11   ??? MYFORTIC 180 mg EC tablet Take 3 tablets (540 mg) in the morning, 2 tablets (360 mg) in the afternoon, and 3 tablets (540 mg) at night 240 tablet 11   ??? sulfamethoxazole-trimethoprim (BACTRIM) 400-80 mg per tablet Take 1 tablet (80 mg of trimethoprim total) by mouth Every Monday, Wednesday, and Friday. 12 tablet 5   ??? tacrolimus (ENVARSUS XR) 1 mg Tb24 extended release tablet Take 1 (1mg ) tablet with 5 (4mg ) tablets by mouth daily. Total daily dose = 21mg . 30 tablet 11   ??? tacrolimus (ENVARSUS XR) 4 mg Tb24 extended release tablet Take 5 (4mg ) tablets with 1 (1mg ) tablet by mouth daily. Total daily dose = 21mg . 150 tablet 11   ??? traMADoL (ULTRAM) 50 mg tablet Take 1-2 tablets (50-100 mg total) by mouth two (2) times a day as needed for other (pain). 30 tablet 0   ??? valGANciclovir (VALCYTE) 450 mg tablet Take 1 tablet (450 mg total) by mouth Every Monday and Thursday. 30 tablet 2   ??? zolpidem (AMBIEN) 10 mg tablet Take 10 mg by mouth nightly as needed for sleep.       No current facility-administered medications for this visit.      Facility-Administered Medications Ordered in Other Visits   Medication Dose Route Frequency Provider Last Rate Last Dose   ??? oxyCODONE-acetaminophen (PERCOCET) 5-325 mg tablet 1 tablet  1 tablet Oral Once Sherene Sires, MD           No Known Allergies    Patient Active Problem List   Diagnosis   ??? Complication of transplanted kidney   ??? Alport syndrome   ??? Esophageal reflux   ??? Essential (primary) hypertension   ??? H/O kidney transplant   ??? Pityriasis versicolor   ???  Acute renal failure (CMS-HCC)   ??? Anemia   ??? Transplant rejection   ??? Hyperkalemia   ??? Kidney transplant rejection   ??? Acute dyspnea   ??? ESRD on dialysis (CMS-HCC)   ??? Secondary hyperparathyroidism of renal origin (CMS-HCC)   ??? Kidney replaced by transplant       Reviewed and up to date in Epic.    Appropriateness of Therapy     Is medication and dose appropriate based on diagnosis? Yes    Baseline Quality of Life Assessment      How many days over the past month did your transplant keep you from your normal activities? new transplant so no days normal but doing ok per patient    Financial Information     Medication Assistance provided: Prior Authorization    Anticipated copay of $0 on part b for both strengthsper last fill at [facility] reviewed with patient. Verified delivery address.    Delivery Information     Scheduled delivery date: n/a- pt wants call back next week as still has 3 weeks on hand    Expected start date: pt is currently already taking    Medication will be delivered via n/a to the n/a address in [place].  This shipment will require a signature.      Explained the services we provide at [facility] and that each month we would call to set up refills.  Stressed importance of returning phone calls so that we could ensure they receive their medications in time each month.  Informed patient that we should be setting up refills 7-10 days prior to when they will run out of medication.  A pharmacist will reach out to perform a clinical assessment periodically.  Informed patient that a welcome packet and a drug information handout will be sent.      Patient verbalized understanding of the above information as well as how to contact the pharmacy at [phone] option 4 with any questions/concerns.  The pharmacy is open Monday through Friday 8:30am-4:30pm.  A pharmacist is available 24/7 via pager to answer any clinical questions they may have.    Patient Specific Needs     ? Does the patient have any physical, cognitive, or cultural barriers? No    ? Patient prefers to have medications discussed with  Patient     ? Is the patient able to read and understand education materials at a high school level or above? No    ? Patient's primary language is  English     ? Is the patient high risk? Yes, patient taking a REMS drug     ? Does the patient require a Care Management Plan? No     ? Does the patient require physician intervention or other additional services (i.e. nutrition, smoking cessation, social work)? No      [name]  [facility] Specialty Pharmacist

## 2019-03-14 NOTE — Unmapped (Addendum)
Second pass- no bleeding noted per [place]

## 2019-03-14 NOTE — Unmapped (Signed)
Post Kidney Biopsy Instructions    You have had a kidney biopsy to help in the diagnosis of your kidney disease.    Your biopsy results will take several days to complete.  You will receive your biopsy results from your primary care nephrologists.  If you have questions or concerns related to the biopsy, you can call the [facility] hospital operator at [phone] and ask them to page the renal fellow on call.  Please follow the instructions below:  ??? If you received any pain and/or anxiety medications for you biopsy, you should not drive or operate heavy equipment for 24 hrs.  ??? Avoid heavy physical activity for the next 3 weeks.  ??? No signing of legal documents on the day of the procedure.  ??? No drinking of any alcoholic beverage on the day of the procedure.  ??? Do not drive or operate heavy machinery on the day of the procedure.  ??? No heavy lifting greater than 15 pounds or exertion for 3 weeks.  ??? No NSAIDSs (ibuprofen, Motrin, Aleve) or aspirin for the next 7 days unless your doctor has instructed you differently.  ??? Keep your dressing clean, dry and in place for 2 days.  After 2 days, you may shower.  After showering, remove the dressing and wash the area with warm, soapy water.  Re-apply a dressing and repeat this process until the biopsy site is healed.  Do not take a bath or get into a hot tub or go swimming until site is healed.  ??? If you have blood in your urine which does not seem to be getting less each time you urinate, have difficulty urinating, increasing back pain, or if you feel faint or dizzy, you should call the [facility] hospital operator at [phone] and ask them to page the renal fellow on call.  If you have any severe or sudden change in the way you feel, you should go to the nearest ER or call 911.

## 2019-03-14 NOTE — Unmapped (Signed)
Pre scan

## 2019-03-14 NOTE — Unmapped (Addendum)
First pass- no bleeding noted per [place]

## 2019-03-14 NOTE — Unmapped (Signed)
Lidocaine [place] admin by MD Genella Rife

## 2019-03-14 NOTE — Unmapped (Addendum)
Bandage and abdominal binder applied

## 2019-03-14 NOTE — Unmapped (Signed)
US Renal Transplant Biopsy Note  Date of Scheduled Biopsy: 03/14/19  Rush?: Yes  Patient Name: [name]  MR: [id number]  Age: 33 y.o.  Gender: Male  Race: African American  Procedures: Ultrasound Guided Percutaneous Transplant Kidney Biopsy under Moderate Sedation  Tissue Submitted: Kidney  Special Studies Required: LM, IF, EM    Please contact Mickeal Needy at [phone] with the results. If unable to reach them, please contact the on call nephrology attending.  ----------------------------------------------------------------------------------------------------------------------  Date of allograft implantation: 02/20/2019  Underlying native kidney disease: Alport Syndrome  Was the diagnosis established by biopsy? no   Previous transplant biopsies? No, but has had a biopsy on the rejected kidney  If yes, what were the previous diagnoses?   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 Restarted HD in 2015.     Previous kidney transplants: yes If yes, this is #: 2  History/Clinical Diagnosis/Indication for Biopsy:     Transplanted on 02/20/2019, creatinine has steadily risen since his transplant. Concern for delayed graft function vs rejection. After transplant, he had delayed graft function and underwent two sessions of HD. He was discharged on 02/23/19 to his outpatient HD center and has been receiving dialysis to current date. He missed two sessions last week, but last received HD on 9/26.  ----------------------------------------------------------------------------------------------------------------------  Current Baseline Immunosuppression: Envarsus 5 mg, Myfortic 540 BID  Specific anti-rejection treatment before biopsy: no   If yes, what was the type of treatment? n/a  Patient off immunosuppression?: no   Patient seems compliant? yes   Patient is currently back on hemodialysis? no ----------------------------------------------------------------------------------------------------------------------  Evidence of allo-antibodies?   Lab Results   Component Value Date    DSA1C  03/02/2019      Comment:      The HLA antigens listed are donor antigens and the corresponding MFI value.  MFI values >= 1000 are considered positive.  A negative result does not exclude the presence of low level DSA.  Blank donor antigen and MFI fields indicate unavailable donor HLA   type or lack of appropriate single antigen bead to determine DSA.  As such, the presence or absence of DSA to the locus cannot be confirmed.     Donor specific antibody (DSA) testing is performed with a  solid phase multiplex single antigen bead array method.  All procedures and reagents have been validated and performance characteristics determined by the [facility].    Certain of these tests have not been cleared/approved by the U.S. Food and Drug Administration (FDA).  The FDA has determined that such approval/clearance is not necessary because this laboratory is certified under the Clinical Laboratory Improvements   Amendments to perform high complexity testing.  This test is used for clinical purposes.  It should not be regarded as investigational or for research.  All HLA typings are performed using molecular methodologies.  Details of test procedures may be   obtained by calling [phone].  HLA-A, B, C, DR, and DQ results are serological equivalents of the molecular type.    DSA2C : 03/08/2013      If yes, HLA type/MFI?:   Blood Pressure (mmHg):   BP Readings from Last 3 Encounters:   03/14/19 153/99   03/07/19 149/95   03/07/19 149/95     Urinalysis:   Lab Results   Component Value Date    Color, UA Light Yellow 03/07/2019    Color, UA RED 03/16/2014    Specific Gravity, UA 1.005 03/07/2019  Specific Gravity, UA 1.015 03/16/2014    pH, UA 7.0 03/07/2019    pH, UA 9.0 03/16/2014    Glucose, UA Negative 03/07/2019    Glucose, UA NEG 03/16/2014    Ketones, UA Negative 03/07/2019    Ketones, UA NEG 03/16/2014    Blood, UA Moderate (A) 03/07/2019    Blood, UA +4 03/16/2014    Nitrite, UA Negative 03/07/2019    Nitrite, UA NEG 03/16/2014    Leukocyte Esterase, UA Large (A) 03/07/2019    Leukocyte Esterase, UA : 03/16/2014    Urobilinogen, UA 0.2 mg/dL 78/46/9629    Urobilinogen, UA 1 03/16/2014    Bilirubin, UA Negative 03/07/2019    Bilirubin, UA NEG 03/16/2014    WBC, UA >100 (H) 03/16/2014    RBC, UA >100 (H) 03/16/2014     Urine protein/creatinine ratio: 1.14  Creatinine (present peak): 9.6  Creatinine (baseline): No baseline achieved yet  Lab Results   Component Value Date    CREATININE 9.61 (H) 03/14/2019    CREATININE 15.19 (H) 03/11/2019    CREATININE 13.72 (H) 03/09/2019    CREATININE 10.48 (H) 03/07/2019    CREATININE 9.82 (H) 03/02/2019     ----------------------------------------------------------------------------------------------------------------------  Clinical signs of infections at time of current biopsy:  BK: no  BK blood viral load (if positive):  CMV: no  CMV viral load (if positive):  Herpes: no   Hepatitis B: no   Hepatitis C: no   Bacteria: no   Fungi: no   Urinary tract infection: no   ----------------------------------------------------------------------------------------------------------------------  Stenosis of renal artery: no   Obstruction of ureter: no   Lymphocele: no   ----------------------------------------------------------------------------------------------------------------------  Donor Information (if available)  Age of donor: Unknown  Gender: Unknown Race: Unknown  Type of donor: Cadaveric  Ischemia time: Unknown  Delayed graft function: yes   If yes, how many days on hemodialysis: To date    23 SURGICAL PATHOLOGY REQUEST FORM  DATE      [facility] Test#  CPT Code Description   4250  88331  FROZEN SECTION - SINGLE   4251  88332  FROZEN SECTIO - ADDITIONAL         4227  88300  GROSS ONLY (NO SECTION)   4228  88302  LEVEL II - GROSS & MICRO EXAM   4229  88304  LEVEL III - GROSS & MICRO EXAM   4230  88305  LEVEL IV - GROSS & MICRO EXAM   4231  88307  LEVEL V - GROSS & MICRO EXAM   4232  88309  LEVEL VI - GROSS & MICRO EXAM   4233  88311  DECALCIFICATION   4234  88321  CONSULT ON REFERRED SLIDES   4235  88323  CONSULT WITH SLIDE PREP   4236  88329  CONSULT DURING SURGERY   4237  [phone]  BONE MARROW ASPIRATION     DEPT. USE ONLY     CODE QTY MISCELLANEOUS (SPECIFY) AMOUNT

## 2019-03-14 NOTE — Unmapped (Signed)
KIDNEY BIOPSY PROCEDURE NOTE    INDICATIONS:  Elevated creatinine in a renal transplant    CONSENT/TIME OUT:    Risks, benefits and alternatives including blood loss requiring transfusion, loss of kidney or kidney function, and death were discussed with patient.  Written informed consent was obtained prior to the procedure and is detailed in the medical record.  Prior to the start of the procedure, a time out was taken and the identity of the patient was confirmed via name, medical record number and date of birth.  The availability of the correct equipment was verified.    PROCEDURE:  Name: Transplant Kidney Biopsy  Description: Ultrasound-guided transplant kidney biopsy    Pre-Procedure blood specimens were sent for CBC, PT/aPTT, and type & screen.     The patient was given 1 mg of midazolam and 25 mcg of Fentanyl during the procedure, and 8 mL lidocaine 1% were used for local anesthesia. Under ultrasound guidance, a 16cm 16G biopsy needle was inserted into the left kidney for a total of 2 passes with 2 core specimens obtained for analysis.      COMPLICATIONS:   Complications: None; no hematoma or active bleeding visualized    The patient will be closely watched in the recovery area, kept flat for 6 hours while wearing an abdominal binder, and will have a repeat CBC and ultrasound in 4 hours to insure no hemodynamically significant bleeding post-biopsy.    SPECIMEN(S):  Core #1: 1.5 x 0.1 x 0.1 (LM 1.0, EM 0.2, LM 0.3)  Core #2: 1.8 x 0.1 x 0.1 (LM 1.0, IF 0.3,  LM 0.5)

## 2019-03-14 NOTE — Unmapped (Signed)
Pre scan complete, pathology called

## 2019-03-15 DIAGNOSIS — Z940 Kidney transplant status: Secondary | ICD-10-CM

## 2019-03-15 MED ORDER — MYFORTIC 180 MG TABLET,DELAYED RELEASE
ORAL_TABLET | ORAL | 11 refills | 30 days | Status: CP
Start: 2019-03-15 — End: 2019-03-17

## 2019-03-15 NOTE — Unmapped (Signed)
At 5:45 PM, Dr. Jones Bales communicated preliminary biopsy findings to me.     Subacute tubular injury, with no evidence of rejection. C4D negative. Persistent tubular injury, impossible to speak on etiology, but does not look like Calcineurin toxicity.     There will be no plan for hospital admission, and the patient can be discharged home.    Mickeal Needy, MD  PGY4  Division of Nephrology and Hypertension

## 2019-03-16 ENCOUNTER — Other Ambulatory Visit (HOSPITAL_COMMUNITY)
Admission: AD | Admit: 2019-03-16 | Discharge: 2019-03-16 | Disposition: A | Discharge status: Home / Self Care | Payer: Medicare Other | Source: Ambulatory Visit | Attending: Nephrology | Admitting: Nephrology

## 2019-03-16 DIAGNOSIS — D899 Disorder involving the immune mechanism, unspecified: Secondary | ICD-10-CM | POA: Insufficient documentation

## 2019-03-16 DIAGNOSIS — T8610 Unspecified complication of kidney transplant: Secondary | ICD-10-CM | POA: Insufficient documentation

## 2019-03-16 DIAGNOSIS — Z09 Encounter for follow-up examination after completed treatment for conditions other than malignant neoplasm: Secondary | ICD-10-CM | POA: Insufficient documentation

## 2019-03-16 DIAGNOSIS — Z114 Encounter for screening for human immunodeficiency virus [HIV]: Secondary | ICD-10-CM | POA: Diagnosis not present

## 2019-03-16 DIAGNOSIS — N189 Chronic kidney disease, unspecified: Secondary | ICD-10-CM | POA: Insufficient documentation

## 2019-03-16 DIAGNOSIS — D631 Anemia in chronic kidney disease: Secondary | ICD-10-CM | POA: Insufficient documentation

## 2019-03-16 DIAGNOSIS — Z79899 Other long term (current) drug therapy: Secondary | ICD-10-CM | POA: Insufficient documentation

## 2019-03-16 DIAGNOSIS — N390 Urinary tract infection, site not specified: Secondary | ICD-10-CM | POA: Diagnosis not present

## 2019-03-16 DIAGNOSIS — E1129 Type 2 diabetes mellitus with other diabetic kidney complication: Secondary | ICD-10-CM | POA: Insufficient documentation

## 2019-03-16 DIAGNOSIS — Z9483 Pancreas transplant status: Secondary | ICD-10-CM | POA: Insufficient documentation

## 2019-03-16 DIAGNOSIS — B259 Cytomegaloviral disease, unspecified: Secondary | ICD-10-CM | POA: Insufficient documentation

## 2019-03-16 DIAGNOSIS — Z789 Other specified health status: Secondary | ICD-10-CM | POA: Diagnosis not present

## 2019-03-16 DIAGNOSIS — E559 Vitamin D deficiency, unspecified: Secondary | ICD-10-CM | POA: Diagnosis not present

## 2019-03-16 DIAGNOSIS — Z940 Kidney transplant status: Secondary | ICD-10-CM | POA: Insufficient documentation

## 2019-03-16 LAB — CBC WITH DIFFERENTIAL/PLATELET
Abs Immature Granulocytes: 0.01 10*3/uL (ref 0.00–0.07)
Basophils Absolute: 0 10*3/uL (ref 0.0–0.1)
Basophils Relative: 1 %
Eosinophils Absolute: 0.3 10*3/uL (ref 0.0–0.5)
Eosinophils Relative: 7 %
HCT: 25.6 % — ABNORMAL LOW (ref 39.0–52.0)
Hemoglobin: 8.1 g/dL — ABNORMAL LOW (ref 13.0–17.0)
Immature Granulocytes: 0 %
Lymphocytes Relative: 1 %
Lymphs Abs: 0 10*3/uL — ABNORMAL LOW (ref 0.7–4.0)
MCH: 26.2 pg (ref 26.0–34.0)
MCHC: 31.6 g/dL (ref 30.0–36.0)
MCV: 82.8 fL (ref 80.0–100.0)
Monocytes Absolute: 0.2 10*3/uL (ref 0.1–1.0)
Monocytes Relative: 6 %
Neutro Abs: 3.3 10*3/uL (ref 1.7–7.7)
Neutrophils Relative %: 85 %
Platelets: 283 10*3/uL (ref 150–400)
RBC: 3.09 MIL/uL — ABNORMAL LOW (ref 4.22–5.81)
RDW: 17.5 % — ABNORMAL HIGH (ref 11.5–15.5)
WBC: 3.9 10*3/uL — ABNORMAL LOW (ref 4.0–10.5)
nRBC: 0 % (ref 0.0–0.2)

## 2019-03-16 LAB — BASIC METABOLIC PANEL
Anion gap: 11 (ref 5–15)
BUN: 49 mg/dL — ABNORMAL HIGH (ref 6–20)
CO2: 25 mmol/L (ref 22–32)
Calcium: 10.1 mg/dL (ref 8.9–10.3)
Chloride: 104 mmol/L (ref 98–111)
Creatinine, Ser: 9.91 mg/dL — ABNORMAL HIGH (ref 0.61–1.24)
GFR calc Af Amer: 7 mL/min — ABNORMAL LOW (ref >60)
GFR calc non Af Amer: 6 mL/min — ABNORMAL LOW (ref >60)
Glucose, Bld: 165 mg/dL — ABNORMAL HIGH (ref 70–99)
Potassium: 5.2 mmol/L — ABNORMAL HIGH (ref 3.5–5.1)
Sodium: 140 mmol/L (ref 135–145)

## 2019-03-16 LAB — PHOSPHORUS: Phosphorus: 5.6 mg/dL — ABNORMAL HIGH (ref 2.5–4.6)

## 2019-03-16 LAB — MAGNESIUM: Magnesium: 1.9 mg/dL (ref 1.7–2.4)

## 2019-03-16 MED FILL — MYFORTIC 180 MG TABLET,DELAYED RELEASE: 30 days supply | Qty: 240 | Fill #0 | Status: AC

## 2019-03-17 DIAGNOSIS — Z940 Kidney transplant status: Secondary | ICD-10-CM

## 2019-03-17 LAB — PHOSPHORUS: Lab: 5.6 — ABNORMAL HIGH

## 2019-03-17 LAB — CHLORIDE: Lab: 104

## 2019-03-17 LAB — CBC W/ DIFFERENTIAL
BASOPHILS ABSOLUTE COUNT: 0 10*9/L
BASOPHILS RELATIVE PERCENT: 1 %
EOSINOPHILS ABSOLUTE COUNT: 0.3 10*9/L
EOSINOPHILS RELATIVE PERCENT: 7 %
HEMATOCRIT: 25.6 % — ABNORMAL LOW
HEMOGLOBIN: 8.1 g/dL — ABNORMAL LOW
LYMPHOCYTES ABSOLUTE COUNT: 0 10*9/L — ABNORMAL LOW
LYMPHOCYTES RELATIVE PERCENT: 1 %
MEAN CORPUSCULAR HEMOGLOBIN: 26.2 pg
MEAN CORPUSCULAR VOLUME: 82.8 fL
MONOCYTES ABSOLUTE COUNT: 0.2 10*9/L
MONOCYTES RELATIVE PERCENT: 6 %
NEUTROPHILS ABSOLUTE COUNT: 3.3 10*9/L
NEUTROPHILS RELATIVE PERCENT: 85 %
PLATELET COUNT: 283 10*9/L
RED BLOOD CELL COUNT: 3.09 10*12/L — ABNORMAL LOW
RED CELL DISTRIBUTION WIDTH: 17.5 % — ABNORMAL HIGH
WHITE BLOOD CELL COUNT: 3.9 10*9/L — ABNORMAL LOW

## 2019-03-17 LAB — BASIC METABOLIC PANEL
BLOOD UREA NITROGEN: 49 mg/dL — ABNORMAL HIGH
CALCIUM: 10.1 mg/dL
CHLORIDE: 104 mmol/L
CO2: 25 mmol/L
CREATININE: 9.91 mg/dL — ABNORMAL HIGH
GLUCOSE RANDOM: 165 mg/dL — ABNORMAL HIGH
POTASSIUM: 5.2 mmol/L — ABNORMAL HIGH

## 2019-03-17 LAB — MAGNESIUM: Lab: 1.9

## 2019-03-17 LAB — MACROCYTES: Lab: 0

## 2019-03-17 MED ORDER — MYFORTIC 180 MG TABLET,DELAYED RELEASE
ORAL_TABLET | Freq: Two times a day (BID) | ORAL | 11 refills | 30 days | Status: CP
Start: 2019-03-17 — End: 2019-03-25

## 2019-03-17 NOTE — Unmapped (Signed)
Myfortic dose decrease  Last filled yesterday  Copay = $0

## 2019-03-17 NOTE — Unmapped (Signed)
Pt Cr 9.91 Urine output 1000 ml+ advised to hold dialysis today. Report diarrhea 4 to 5 times a day. Myfortic dose decrease  to 540 mg BID.

## 2019-03-18 ENCOUNTER — Ambulatory Visit: Admit: 2019-03-18 | Discharge: 2019-03-19 | Payer: MEDICARE

## 2019-03-18 DIAGNOSIS — Z79899 Other long term (current) drug therapy: Secondary | ICD-10-CM

## 2019-03-18 DIAGNOSIS — Z0000 Encounter for general adult medical examination without abnormal findings: Secondary | ICD-10-CM

## 2019-03-18 DIAGNOSIS — Z940 Kidney transplant status: Secondary | ICD-10-CM

## 2019-03-18 DIAGNOSIS — R197 Diarrhea, unspecified: Secondary | ICD-10-CM

## 2019-03-18 DIAGNOSIS — E875 Hyperkalemia: Secondary | ICD-10-CM

## 2019-03-18 LAB — CBC W/ AUTO DIFF
BASOPHILS ABSOLUTE COUNT: 0 10*9/L (ref 0.0–0.1)
BASOPHILS ABSOLUTE COUNT: 0.1 10*9/L (ref 0.0–0.1)
BASOPHILS RELATIVE PERCENT: 1 %
BASOPHILS RELATIVE PERCENT: 1.2 %
EOSINOPHILS ABSOLUTE COUNT: 0.3 10*9/L (ref 0.0–0.4)
EOSINOPHILS ABSOLUTE COUNT: 0.4 10*9/L (ref 0.0–0.4)
EOSINOPHILS RELATIVE PERCENT: 7.3 %
EOSINOPHILS RELATIVE PERCENT: 8 %
HEMATOCRIT: 24.8 % — ABNORMAL LOW (ref 41.0–53.0)
HEMOGLOBIN: 8.2 g/dL — ABNORMAL LOW (ref 13.5–17.5)
HEMOGLOBIN: 8.3 g/dL — ABNORMAL LOW (ref 13.5–17.5)
LARGE UNSTAINED CELLS: 1 % (ref 0–4)
LARGE UNSTAINED CELLS: 1 % (ref 0–4)
LYMPHOCYTES ABSOLUTE COUNT: 0.1 10*9/L — ABNORMAL LOW (ref 1.5–5.0)
LYMPHOCYTES ABSOLUTE COUNT: 0.1 10*9/L — ABNORMAL LOW (ref 1.5–5.0)
LYMPHOCYTES RELATIVE PERCENT: 1.9 %
MEAN CORPUSCULAR HEMOGLOBIN CONC: 32.9 g/dL (ref 31.0–37.0)
MEAN CORPUSCULAR HEMOGLOBIN CONC: 33 g/dL (ref 31.0–37.0)
MEAN CORPUSCULAR HEMOGLOBIN: 26.6 pg (ref 26.0–34.0)
MEAN CORPUSCULAR HEMOGLOBIN: 26.8 pg (ref 26.0–34.0)
MEAN CORPUSCULAR VOLUME: 80.7 fL (ref 80.0–100.0)
MEAN PLATELET VOLUME: 8.7 fL (ref 7.0–10.0)
MEAN PLATELET VOLUME: 8.8 fL (ref 7.0–10.0)
MONOCYTES ABSOLUTE COUNT: 0.2 10*9/L (ref 0.2–0.8)
MONOCYTES ABSOLUTE COUNT: 0.2 10*9/L (ref 0.2–0.8)
MONOCYTES RELATIVE PERCENT: 3.9 %
MONOCYTES RELATIVE PERCENT: 4.2 %
NEUTROPHILS ABSOLUTE COUNT: 4 10*9/L (ref 2.0–7.5)
NEUTROPHILS RELATIVE PERCENT: 84 %
NEUTROPHILS RELATIVE PERCENT: 84.5 %
PLATELET COUNT: 308 10*9/L (ref 150–440)
RED BLOOD CELL COUNT: 3.08 10*12/L — ABNORMAL LOW (ref 4.50–5.90)
RED CELL DISTRIBUTION WIDTH: 19 % — ABNORMAL HIGH (ref 12.0–15.0)
RED CELL DISTRIBUTION WIDTH: 19.2 % — ABNORMAL HIGH (ref 12.0–15.0)
WBC ADJUSTED: 4.7 10*9/L (ref 4.5–11.0)
WBC ADJUSTED: 4.7 10*9/L (ref 4.5–11.0)

## 2019-03-18 LAB — COMPREHENSIVE METABOLIC PANEL
ALBUMIN: 4.7 g/dL (ref 3.5–5.0)
ALKALINE PHOSPHATASE: 94 U/L (ref 38–126)
ALT (SGPT): 23 U/L (ref <50)
ANION GAP: 18 mmol/L — ABNORMAL HIGH (ref 7–15)
AST (SGOT): 18 U/L — ABNORMAL LOW (ref 19–55)
BILIRUBIN TOTAL: 0.5 mg/dL (ref 0.0–1.2)
BLOOD UREA NITROGEN: 50 mg/dL — ABNORMAL HIGH (ref 7–21)
BUN / CREAT RATIO: 5
CHLORIDE: 111 mmol/L — ABNORMAL HIGH (ref 98–107)
CO2: 20 mmol/L — ABNORMAL LOW (ref 22.0–30.0)
CREATININE: 9.19 mg/dL — ABNORMAL HIGH (ref 0.70–1.30)
EGFR CKD-EPI AA MALE: 8 mL/min/{1.73_m2} — ABNORMAL LOW (ref >=60)
EGFR CKD-EPI NON-AA MALE: 7 mL/min/{1.73_m2} — ABNORMAL LOW (ref >=60)
GLUCOSE RANDOM: 132 mg/dL (ref 70–179)
POTASSIUM: 5.7 mmol/L — ABNORMAL HIGH (ref 3.5–5.0)
PROTEIN TOTAL: 8 g/dL (ref 6.5–8.3)
SODIUM: 149 mmol/L — ABNORMAL HIGH (ref 135–145)

## 2019-03-18 LAB — URINALYSIS
BILIRUBIN UA: NEGATIVE
GLUCOSE UA: NEGATIVE
PH UA: 6.5 (ref 5.0–9.0)
PROTEIN UA: 30 — AB
RBC UA: 3 /HPF — ABNORMAL HIGH (ref <3)
SPECIFIC GRAVITY UA: 1.01 (ref 1.005–1.040)
SQUAMOUS EPITHELIAL: 2 /HPF (ref 0–5)
UROBILINOGEN UA: 0.2
WBC UA: 6 /HPF — ABNORMAL HIGH (ref <2)

## 2019-03-18 LAB — RENAL FUNCTION PANEL
ALBUMIN: 4.7 g/dL (ref 3.5–5.0)
ANION GAP: 18 mmol/L — ABNORMAL HIGH (ref 7–15)
BLOOD UREA NITROGEN: 50 mg/dL — ABNORMAL HIGH (ref 7–21)
BUN / CREAT RATIO: 5
CALCIUM: 10.8 mg/dL — ABNORMAL HIGH (ref 8.5–10.2)
CHLORIDE: 111 mmol/L — ABNORMAL HIGH (ref 98–107)
CREATININE: 9.19 mg/dL — ABNORMAL HIGH (ref 0.70–1.30)
EGFR CKD-EPI AA MALE: 8 mL/min/{1.73_m2} — ABNORMAL LOW (ref >=60)
GLUCOSE RANDOM: 132 mg/dL (ref 70–179)
PHOSPHORUS: 5.1 mg/dL — ABNORMAL HIGH (ref 2.9–4.7)
POTASSIUM: 5.7 mmol/L — ABNORMAL HIGH (ref 3.5–5.0)
SODIUM: 149 mmol/L — ABNORMAL HIGH (ref 135–145)

## 2019-03-18 LAB — PHOSPHORUS: Phosphate:MCnc:Pt:Ser/Plas:Qn:: 5.1 — ABNORMAL HIGH

## 2019-03-18 LAB — POTASSIUM: Potassium:SCnc:Pt:Ser/Plas:Qn:: 5.7 — ABNORMAL HIGH

## 2019-03-18 LAB — MAGNESIUM
Magnesium:MCnc:Pt:Ser/Plas:Qn:: 1.8
Magnesium:MCnc:Pt:Ser/Plas:Qn:: 1.8

## 2019-03-18 LAB — BUN / CREAT RATIO: Urea nitrogen/Creatinine:MRto:Pt:Ser/Plas:Qn:: 5

## 2019-03-18 LAB — HEMOGLOBIN: Hemoglobin:MCnc:Pt:Bld:Qn:: 8.3 — ABNORMAL LOW

## 2019-03-18 LAB — PROTEIN / CREATININE RATIO, URINE: CREATININE, URINE: 89.6 mg/dL

## 2019-03-18 LAB — TACROLIMUS, TROUGH: Lab: 8.7

## 2019-03-18 LAB — BLOOD UA

## 2019-03-18 LAB — CREATININE, URINE: Lab: 89.6

## 2019-03-18 LAB — BASOPHILS RELATIVE PERCENT: Lab: 1.2

## 2019-03-18 LAB — TACROLIMUS LEVEL: Tacrolimus (FK506) - LabCorp: 8.7 ng/mL (ref 2.0–20.0)

## 2019-03-18 MED ORDER — FUROSEMIDE 40 MG TABLET
ORAL_TABLET | Freq: Every day | ORAL | 0 refills | 3.00000 days | Status: CP
Start: 2019-03-18 — End: 2019-03-25

## 2019-03-18 NOTE — Unmapped (Signed)
Potassium 5.7; patient advised to take lasix 40 mg daily

## 2019-03-19 ENCOUNTER — Other Ambulatory Visit (HOSPITAL_COMMUNITY): Admission: RE | Admit: 2019-03-19 | Payer: Medicare Other | Source: Home / Self Care

## 2019-03-19 ENCOUNTER — Other Ambulatory Visit (HOSPITAL_COMMUNITY)
Admission: RE | Admit: 2019-03-19 | Discharge: 2019-03-19 | Disposition: A | Discharge status: Home / Self Care | Payer: Medicare Other | Source: Ambulatory Visit | Attending: Nephrology | Admitting: Nephrology

## 2019-03-19 DIAGNOSIS — Z79899 Other long term (current) drug therapy: Secondary | ICD-10-CM | POA: Insufficient documentation

## 2019-03-19 DIAGNOSIS — Z09 Encounter for follow-up examination after completed treatment for conditions other than malignant neoplasm: Secondary | ICD-10-CM | POA: Insufficient documentation

## 2019-03-19 DIAGNOSIS — Z789 Other specified health status: Secondary | ICD-10-CM | POA: Diagnosis not present

## 2019-03-19 DIAGNOSIS — N189 Chronic kidney disease, unspecified: Secondary | ICD-10-CM | POA: Insufficient documentation

## 2019-03-19 DIAGNOSIS — D631 Anemia in chronic kidney disease: Secondary | ICD-10-CM | POA: Insufficient documentation

## 2019-03-19 DIAGNOSIS — T8610 Unspecified complication of kidney transplant: Secondary | ICD-10-CM | POA: Diagnosis not present

## 2019-03-19 DIAGNOSIS — Z940 Kidney transplant status: Secondary | ICD-10-CM | POA: Diagnosis not present

## 2019-03-19 DIAGNOSIS — D899 Disorder involving the immune mechanism, unspecified: Secondary | ICD-10-CM | POA: Diagnosis not present

## 2019-03-19 DIAGNOSIS — E1129 Type 2 diabetes mellitus with other diabetic kidney complication: Secondary | ICD-10-CM | POA: Insufficient documentation

## 2019-03-19 DIAGNOSIS — B259 Cytomegaloviral disease, unspecified: Secondary | ICD-10-CM | POA: Diagnosis not present

## 2019-03-19 DIAGNOSIS — E559 Vitamin D deficiency, unspecified: Secondary | ICD-10-CM | POA: Diagnosis not present

## 2019-03-19 DIAGNOSIS — Z9483 Pancreas transplant status: Secondary | ICD-10-CM | POA: Insufficient documentation

## 2019-03-19 DIAGNOSIS — N390 Urinary tract infection, site not specified: Secondary | ICD-10-CM | POA: Insufficient documentation

## 2019-03-19 DIAGNOSIS — Z114 Encounter for screening for human immunodeficiency virus [HIV]: Secondary | ICD-10-CM | POA: Diagnosis not present

## 2019-03-19 LAB — CMV QUANT LOG10: Lab: 0

## 2019-03-19 LAB — CMV DNA, QUANTITATIVE, PCR: CMV VIRAL LD: NOT DETECTED

## 2019-03-19 LAB — TACROLIMUS, TROUGH: Lab: 11.1

## 2019-03-19 LAB — BASIC METABOLIC PANEL
Anion gap: 11 (ref 5–15)
BUN: 52 mg/dL — ABNORMAL HIGH (ref 6–20)
CO2: 21 mmol/L — ABNORMAL LOW (ref 22–32)
Calcium: 10.4 mg/dL — ABNORMAL HIGH (ref 8.9–10.3)
Chloride: 107 mmol/L (ref 98–111)
Creatinine, Ser: 8.36 mg/dL — ABNORMAL HIGH (ref 0.61–1.24)
GFR calc Af Amer: 9 mL/min — ABNORMAL LOW (ref >60)
GFR calc non Af Amer: 8 mL/min — ABNORMAL LOW (ref >60)
Glucose, Bld: 148 mg/dL — ABNORMAL HIGH (ref 70–99)
Potassium: 5.3 mmol/L — ABNORMAL HIGH (ref 3.5–5.1)
Sodium: 139 mmol/L (ref 135–145)

## 2019-03-19 LAB — CBC WITH DIFFERENTIAL/PLATELET
Abs Immature Granulocytes: 0.03 10*3/uL (ref 0.00–0.07)
Basophils Absolute: 0 10*3/uL (ref 0.0–0.1)
Basophils Relative: 1 %
Eosinophils Absolute: 0.3 10*3/uL (ref 0.0–0.5)
Eosinophils Relative: 6 %
HCT: 24.3 % — ABNORMAL LOW (ref 39.0–52.0)
Hemoglobin: 7.7 g/dL — ABNORMAL LOW (ref 13.0–17.0)
Immature Granulocytes: 1 %
Lymphocytes Relative: 1 %
Lymphs Abs: 0 10*3/uL — ABNORMAL LOW (ref 0.7–4.0)
MCH: 26 pg (ref 26.0–34.0)
MCHC: 31.7 g/dL (ref 30.0–36.0)
MCV: 82.1 fL (ref 80.0–100.0)
Monocytes Absolute: 0.3 10*3/uL (ref 0.1–1.0)
Monocytes Relative: 6 %
Neutro Abs: 4.6 10*3/uL (ref 1.7–7.7)
Neutrophils Relative %: 85 %
Platelets: 325 10*3/uL (ref 150–400)
RBC: 2.96 MIL/uL — ABNORMAL LOW (ref 4.22–5.81)
RDW: 17.5 % — ABNORMAL HIGH (ref 11.5–15.5)
WBC: 5.3 10*3/uL (ref 4.0–10.5)
nRBC: 0 % (ref 0.0–0.2)

## 2019-03-19 LAB — MAGNESIUM: Magnesium: 1.8 mg/dL (ref 1.7–2.4)

## 2019-03-19 LAB — PHOSPHORUS: Phosphorus: 4.2 mg/dL (ref 2.5–4.6)

## 2019-03-19 NOTE — Unmapped (Signed)
Pt advised to repeat BMP labs tomorrow at [facility], results will be in Care everywhere.

## 2019-03-19 NOTE — Unmapped (Signed)
Pt advised potassium 5.3 , Cr 8.36 ,continue lasix 3 day regimen and repeat labs on Monday. Will follow up on results

## 2019-03-21 ENCOUNTER — Other Ambulatory Visit (HOSPITAL_COMMUNITY)
Admission: AD | Admit: 2019-03-21 | Discharge: 2019-03-21 | Disposition: A | Discharge status: Home / Self Care | Payer: Medicare Other | Source: Ambulatory Visit | Attending: Nephrology | Admitting: Nephrology

## 2019-03-21 DIAGNOSIS — E559 Vitamin D deficiency, unspecified: Secondary | ICD-10-CM | POA: Diagnosis not present

## 2019-03-21 DIAGNOSIS — Z09 Encounter for follow-up examination after completed treatment for conditions other than malignant neoplasm: Secondary | ICD-10-CM | POA: Insufficient documentation

## 2019-03-21 DIAGNOSIS — Z940 Kidney transplant status: Secondary | ICD-10-CM | POA: Insufficient documentation

## 2019-03-21 DIAGNOSIS — T8610 Unspecified complication of kidney transplant: Secondary | ICD-10-CM | POA: Diagnosis not present

## 2019-03-21 DIAGNOSIS — E1129 Type 2 diabetes mellitus with other diabetic kidney complication: Secondary | ICD-10-CM | POA: Insufficient documentation

## 2019-03-21 DIAGNOSIS — Z79899 Other long term (current) drug therapy: Secondary | ICD-10-CM | POA: Insufficient documentation

## 2019-03-21 DIAGNOSIS — D899 Disorder involving the immune mechanism, unspecified: Secondary | ICD-10-CM | POA: Insufficient documentation

## 2019-03-21 DIAGNOSIS — N390 Urinary tract infection, site not specified: Secondary | ICD-10-CM | POA: Insufficient documentation

## 2019-03-21 DIAGNOSIS — D631 Anemia in chronic kidney disease: Secondary | ICD-10-CM | POA: Diagnosis not present

## 2019-03-21 DIAGNOSIS — B259 Cytomegaloviral disease, unspecified: Secondary | ICD-10-CM | POA: Diagnosis not present

## 2019-03-21 DIAGNOSIS — Z9483 Pancreas transplant status: Secondary | ICD-10-CM | POA: Diagnosis not present

## 2019-03-21 DIAGNOSIS — Z789 Other specified health status: Secondary | ICD-10-CM | POA: Diagnosis not present

## 2019-03-21 DIAGNOSIS — N189 Chronic kidney disease, unspecified: Secondary | ICD-10-CM | POA: Insufficient documentation

## 2019-03-21 DIAGNOSIS — Z114 Encounter for screening for human immunodeficiency virus [HIV]: Secondary | ICD-10-CM | POA: Insufficient documentation

## 2019-03-21 LAB — MAGNESIUM
Lab: 1.7
Magnesium: 1.7 mg/dL (ref 1.7–2.4)

## 2019-03-21 LAB — CBC W/ DIFFERENTIAL
BASOPHILS ABSOLUTE COUNT: 0 10*9/L
BASOPHILS RELATIVE PERCENT: 1 %
EOSINOPHILS RELATIVE PERCENT: 6 %
HEMATOCRIT: 25.1 % — ABNORMAL LOW
HEMOGLOBIN: 7.9 g/dL — ABNORMAL LOW
LYMPHOCYTES ABSOLUTE COUNT: 0 10*9/L — ABNORMAL LOW
LYMPHOCYTES RELATIVE PERCENT: 1 %
MEAN CORPUSCULAR HEMOGLOBIN CONC: 31.5 g/dL
MEAN CORPUSCULAR HEMOGLOBIN: 26.2 pg
MEAN CORPUSCULAR VOLUME: 83.4 fL
MONOCYTES ABSOLUTE COUNT: 0.4 10*9/L
MONOCYTES RELATIVE PERCENT: 10 %
NEUTROPHILS ABSOLUTE COUNT: 2.9 10*9/L
NEUTROPHILS RELATIVE PERCENT: 82 %
PLATELET COUNT: 352 10*9/L
RED BLOOD CELL COUNT: 3.01 10*12/L — ABNORMAL LOW
RED CELL DISTRIBUTION WIDTH: 17.8 % — ABNORMAL HIGH
WBC ADJUSTED: 3.5 10*9/L — ABNORMAL LOW

## 2019-03-21 LAB — BASIC METABOLIC PANEL
Anion gap: 10 (ref 5–15)
BLOOD UREA NITROGEN: 49 mg/dL — ABNORMAL HIGH
BUN: 49 mg/dL — ABNORMAL HIGH (ref 6–20)
CALCIUM: 10.6 mg/dL — ABNORMAL HIGH
CO2: 20 mmol/L — ABNORMAL LOW (ref 22–32)
CO2: 20 mmol/L — ABNORMAL LOW
CREATININE: 6.94 mg/dL — ABNORMAL HIGH
Calcium: 10.6 mg/dL — ABNORMAL HIGH (ref 8.9–10.3)
Chloride: 109 mmol/L (ref 98–111)
Creatinine, Ser: 6.94 mg/dL — ABNORMAL HIGH (ref 0.61–1.24)
EGFR CKD-EPI AA MALE: 11 mL/min/{1.73_m2} — ABNORMAL LOW
GFR calc Af Amer: 11 mL/min — ABNORMAL LOW (ref >60)
GFR calc non Af Amer: 9 mL/min — ABNORMAL LOW (ref >60)
GLUCOSE RANDOM: 116 mg/dL — ABNORMAL HIGH
Glucose, Bld: 116 mg/dL — ABNORMAL HIGH (ref 70–99)
Potassium: 5.8 mmol/L — ABNORMAL HIGH (ref 3.5–5.1)
SODIUM: 139 mmol/L
Sodium: 139 mmol/L (ref 135–145)

## 2019-03-21 LAB — LYMPHOCYTES RELATIVE PERCENT: Lab: 1

## 2019-03-21 LAB — EGFR CKD-EPI AA FEMALE: Lab: 0

## 2019-03-21 LAB — PHOSPHORUS
Lab: 4
Phosphorus: 4 mg/dL (ref 2.5–4.6)

## 2019-03-21 LAB — CBC WITH DIFFERENTIAL/PLATELET
Abs Immature Granulocytes: 0.01 10*3/uL (ref 0.00–0.07)
Basophils Absolute: 0 10*3/uL (ref 0.0–0.1)
Basophils Relative: 1 %
Eosinophils Absolute: 0.2 10*3/uL (ref 0.0–0.5)
Eosinophils Relative: 6 %
HCT: 25.1 % — ABNORMAL LOW (ref 39.0–52.0)
Hemoglobin: 7.9 g/dL — ABNORMAL LOW (ref 13.0–17.0)
Immature Granulocytes: 0 %
Lymphocytes Relative: 1 %
Lymphs Abs: 0 10*3/uL — ABNORMAL LOW (ref 0.7–4.0)
MCH: 26.2 pg (ref 26.0–34.0)
MCHC: 31.5 g/dL (ref 30.0–36.0)
MCV: 83.4 fL (ref 80.0–100.0)
Monocytes Absolute: 0.4 10*3/uL (ref 0.1–1.0)
Monocytes Relative: 10 %
Neutro Abs: 2.9 10*3/uL (ref 1.7–7.7)
Neutrophils Relative %: 82 %
Platelets: 352 10*3/uL (ref 150–400)
RBC: 3.01 MIL/uL — ABNORMAL LOW (ref 4.22–5.81)
RDW: 17.8 % — ABNORMAL HIGH (ref 11.5–15.5)
WBC: 3.5 10*3/uL — ABNORMAL LOW (ref 4.0–10.5)
nRBC: 0 % (ref 0.0–0.2)

## 2019-03-21 LAB — TACROLIMUS LEVEL: Tacrolimus (FK506) - LabCorp: 17.5 ng/mL (ref 2.0–20.0)

## 2019-03-21 MED FILL — SULFAMETHOXAZOLE 400 MG-TRIMETHOPRIM 80 MG TABLET: ORAL | 28 days supply | Qty: 12 | Fill #0

## 2019-03-21 MED FILL — GABAPENTIN 100 MG CAPSULE: 12 days supply | Qty: 12 | Fill #0 | Status: AC

## 2019-03-21 MED FILL — AMLODIPINE 5 MG TABLET: 30 days supply | Qty: 60 | Fill #0 | Status: AC

## 2019-03-21 MED FILL — SULFAMETHOXAZOLE 400 MG-TRIMETHOPRIM 80 MG TABLET: 28 days supply | Qty: 12 | Fill #0 | Status: AC

## 2019-03-21 MED FILL — ACETAMINOPHEN 500 MG TABLET: 13 days supply | Qty: 100 | Fill #0 | Status: AC

## 2019-03-21 MED FILL — VALGANCICLOVIR 450 MG TABLET: 28 days supply | Qty: 8 | Fill #0 | Status: AC

## 2019-03-21 MED FILL — AMLODIPINE 5 MG TABLET: ORAL | 30 days supply | Qty: 60 | Fill #0

## 2019-03-21 NOTE — Unmapped (Signed)
Reviewed labs with Dr Gwynneth Munson, K+5.8, Cr 6.94. no orders given. Will follow up on results.

## 2019-03-21 NOTE — Unmapped (Signed)
[facility] Refill Coordination Note    Specialty Medication(s) to be Shipped:   Transplant: Envarsus 1mg  and Envarsus 4mg     Other medication(s) to be shipped: none     Russell Wade, DOB: 08/09/1985  Phone: [phone] (home) [phone] (work)      All above HIPAA information was verified with patient.     Completed refill call assessment today to schedule patient's medication shipment from the [facility] [phone]).       Specialty medication(s) and dose(s) confirmed: Regimen is correct and unchanged.   Changes to medications: [name] reports no changes at this time.  Changes to insurance: No  Questions for the pharmacist: No    Confirmed patient received Welcome Packet with first shipment. The patient will receive a drug information handout for each medication shipped and additional FDA Medication Guides as required.       DISEASE/MEDICATION-SPECIFIC INFORMATION        N/A    SPECIALTY MEDICATION ADHERENCE     Medication Adherence    Patient reported X missed doses in the last month: 0           envarsus 1mg  : 10 days worth of medication on hand.  envarsus 4mg  : 10 days worth of medication on hand.            SHIPPING     Shipping address confirmed in Epic.     Delivery Scheduled: Yes, Expected medication delivery date: 03/24/19.     Medication will be delivered via UPS to the home address in Epic WAM.    Russell Wade   [facility] Specialty Technician

## 2019-03-23 ENCOUNTER — Other Ambulatory Visit (HOSPITAL_COMMUNITY)
Admission: RE | Admit: 2019-03-23 | Discharge: 2019-03-23 | Disposition: A | Discharge status: Home / Self Care | Payer: Medicare Other | Source: Ambulatory Visit | Attending: Nephrology | Admitting: Nephrology

## 2019-03-23 DIAGNOSIS — E1129 Type 2 diabetes mellitus with other diabetic kidney complication: Secondary | ICD-10-CM | POA: Insufficient documentation

## 2019-03-23 DIAGNOSIS — N189 Chronic kidney disease, unspecified: Secondary | ICD-10-CM | POA: Insufficient documentation

## 2019-03-23 DIAGNOSIS — N390 Urinary tract infection, site not specified: Secondary | ICD-10-CM | POA: Insufficient documentation

## 2019-03-23 DIAGNOSIS — Z940 Kidney transplant status: Secondary | ICD-10-CM | POA: Diagnosis not present

## 2019-03-23 DIAGNOSIS — D631 Anemia in chronic kidney disease: Secondary | ICD-10-CM | POA: Diagnosis not present

## 2019-03-23 DIAGNOSIS — B259 Cytomegaloviral disease, unspecified: Secondary | ICD-10-CM | POA: Diagnosis not present

## 2019-03-23 DIAGNOSIS — T8610 Unspecified complication of kidney transplant: Secondary | ICD-10-CM | POA: Insufficient documentation

## 2019-03-23 DIAGNOSIS — E559 Vitamin D deficiency, unspecified: Secondary | ICD-10-CM | POA: Insufficient documentation

## 2019-03-23 DIAGNOSIS — Z114 Encounter for screening for human immunodeficiency virus [HIV]: Secondary | ICD-10-CM | POA: Diagnosis not present

## 2019-03-23 DIAGNOSIS — Z789 Other specified health status: Secondary | ICD-10-CM | POA: Diagnosis not present

## 2019-03-23 DIAGNOSIS — Z9483 Pancreas transplant status: Secondary | ICD-10-CM | POA: Diagnosis not present

## 2019-03-23 DIAGNOSIS — D899 Disorder involving the immune mechanism, unspecified: Secondary | ICD-10-CM | POA: Insufficient documentation

## 2019-03-23 DIAGNOSIS — Z09 Encounter for follow-up examination after completed treatment for conditions other than malignant neoplasm: Secondary | ICD-10-CM | POA: Insufficient documentation

## 2019-03-23 DIAGNOSIS — Z79899 Other long term (current) drug therapy: Secondary | ICD-10-CM | POA: Diagnosis not present

## 2019-03-23 LAB — CBC W/ DIFFERENTIAL
BASOPHILS ABSOLUTE COUNT: 0 10*9/L
BASOPHILS RELATIVE PERCENT: 1 %
EOSINOPHILS ABSOLUTE COUNT: 0.1 10*9/L
EOSINOPHILS RELATIVE PERCENT: 4 %
HEMATOCRIT: 24.9 % — ABNORMAL LOW
HEMOGLOBIN: 7.9 g/dL — ABNORMAL LOW
LYMPHOCYTES ABSOLUTE COUNT: 0 10*9/L — ABNORMAL LOW
LYMPHOCYTES RELATIVE PERCENT: 1 %
MEAN CORPUSCULAR HEMOGLOBIN CONC: 31.7 g/dL
MEAN CORPUSCULAR VOLUME: 82.2 fL
MONOCYTES ABSOLUTE COUNT: 0.4 10*9/L
NEUTROPHILS ABSOLUTE COUNT: 2.7 10*9/L
NEUTROPHILS RELATIVE PERCENT: 81 %
PLATELET COUNT: 345 10*9/L
RED BLOOD CELL COUNT: 3.03 10*12/L — ABNORMAL LOW
RED CELL DISTRIBUTION WIDTH: 18.5 % — ABNORMAL HIGH
WBC ADJUSTED: 3.3 10*9/L — ABNORMAL LOW

## 2019-03-23 LAB — BASIC METABOLIC PANEL
Anion gap: 11 (ref 5–15)
BLOOD UREA NITROGEN: 49 mg/dL — ABNORMAL HIGH
BUN: 49 mg/dL — ABNORMAL HIGH (ref 6–20)
CALCIUM: 10.7 mg/dL — ABNORMAL HIGH
CHLORIDE: 111 mmol/L
CO2: 16 mmol/L — ABNORMAL LOW (ref 22–32)
CO2: 16 mmol/L — ABNORMAL LOW
CREATININE: 6.88 mg/dL — ABNORMAL HIGH
Calcium: 10.7 mg/dL — ABNORMAL HIGH (ref 8.9–10.3)
Chloride: 111 mmol/L (ref 98–111)
Creatinine, Ser: 6.88 mg/dL — ABNORMAL HIGH (ref 0.61–1.24)
EGFR CKD-EPI AA MALE: 11 mL/min/{1.73_m2} — ABNORMAL LOW
GFR calc Af Amer: 11 mL/min — ABNORMAL LOW (ref >60)
GFR calc non Af Amer: 10 mL/min — ABNORMAL LOW (ref >60)
GLUCOSE RANDOM: 118 mg/dL — ABNORMAL HIGH
Glucose, Bld: 118 mg/dL — ABNORMAL HIGH (ref 70–99)
POTASSIUM: 5.2 mmol/L — ABNORMAL HIGH
Potassium: 5.2 mmol/L — ABNORMAL HIGH (ref 3.5–5.1)
SODIUM: 138 mmol/L
Sodium: 138 mmol/L (ref 135–145)

## 2019-03-23 LAB — SODIUM: Lab: 138

## 2019-03-23 LAB — EOSINOPHILS RELATIVE PERCENT: Lab: 4

## 2019-03-23 LAB — MAGNESIUM
Lab: 1.8
Magnesium: 1.8 mg/dL (ref 1.7–2.4)

## 2019-03-23 LAB — PHOSPHORUS
Lab: 4.2
Phosphorus: 4.2 mg/dL (ref 2.5–4.6)

## 2019-03-23 LAB — CBC WITH DIFFERENTIAL/PLATELET
Abs Immature Granulocytes: 0.02 10*3/uL (ref 0.00–0.07)
Basophils Absolute: 0 10*3/uL (ref 0.0–0.1)
Basophils Relative: 1 %
Eosinophils Absolute: 0.1 10*3/uL (ref 0.0–0.5)
Eosinophils Relative: 4 %
HCT: 24.9 % — ABNORMAL LOW (ref 39.0–52.0)
Hemoglobin: 7.9 g/dL — ABNORMAL LOW (ref 13.0–17.0)
Immature Granulocytes: 1 %
Lymphocytes Relative: 1 %
Lymphs Abs: 0 10*3/uL — ABNORMAL LOW (ref 0.7–4.0)
MCH: 26.1 pg (ref 26.0–34.0)
MCHC: 31.7 g/dL (ref 30.0–36.0)
MCV: 82.2 fL (ref 80.0–100.0)
Monocytes Absolute: 0.4 10*3/uL (ref 0.1–1.0)
Monocytes Relative: 12 %
Neutro Abs: 2.7 10*3/uL (ref 1.7–7.7)
Neutrophils Relative %: 81 %
Platelets: 345 10*3/uL (ref 150–400)
RBC: 3.03 MIL/uL — ABNORMAL LOW (ref 4.22–5.81)
RDW: 18.5 % — ABNORMAL HIGH (ref 11.5–15.5)
WBC: 3.3 10*3/uL — ABNORMAL LOW (ref 4.0–10.5)
nRBC: 0 % (ref 0.0–0.2)

## 2019-03-23 LAB — TACROLIMUS LEVEL: Tacrolimus (FK506) - LabCorp: 7.8 ng/mL (ref 2.0–20.0)

## 2019-03-23 MED FILL — ENVARSUS XR 4 MG TABLET,EXTENDED RELEASE: 30 days supply | Qty: 150 | Fill #0 | Status: AC

## 2019-03-23 MED FILL — ENVARSUS XR 1 MG TABLET,EXTENDED RELEASE: 30 days supply | Qty: 30 | Fill #0 | Status: AC

## 2019-03-23 MED FILL — ENVARSUS XR 1 MG TABLET,EXTENDED RELEASE: 30 days supply | Qty: 30 | Fill #0

## 2019-03-24 LAB — TACROLIMUS LEVEL: Tacrolimus (FK506) - LabCorp: 6.7 ng/mL (ref 2.0–20.0)

## 2019-03-25 ENCOUNTER — Other Ambulatory Visit (HOSPITAL_COMMUNITY)
Admission: RE | Admit: 2019-03-25 | Discharge: 2019-03-25 | Disposition: A | Discharge status: Home / Self Care | Payer: Medicare Other | Source: Ambulatory Visit | Attending: Nephrology | Admitting: Nephrology

## 2019-03-25 ENCOUNTER — Ambulatory Visit: Admit: 2019-03-25 | Discharge: 2019-03-26 | Payer: MEDICARE | Attending: Nephrology | Primary: Nephrology

## 2019-03-25 ENCOUNTER — Ambulatory Visit: Admit: 2019-03-25 | Discharge: 2019-03-26 | Payer: MEDICARE

## 2019-03-25 DIAGNOSIS — N281 Cyst of kidney, acquired: Secondary | ICD-10-CM

## 2019-03-25 DIAGNOSIS — R0602 Shortness of breath: Secondary | ICD-10-CM

## 2019-03-25 DIAGNOSIS — Z0000 Encounter for general adult medical examination without abnormal findings: Secondary | ICD-10-CM

## 2019-03-25 DIAGNOSIS — Z7982 Long term (current) use of aspirin: Secondary | ICD-10-CM

## 2019-03-25 DIAGNOSIS — Z940 Kidney transplant status: Secondary | ICD-10-CM

## 2019-03-25 DIAGNOSIS — Q8781 Alport syndrome: Secondary | ICD-10-CM

## 2019-03-25 DIAGNOSIS — Z4822 Encounter for aftercare following kidney transplant: Secondary | ICD-10-CM

## 2019-03-25 DIAGNOSIS — N189 Chronic kidney disease, unspecified: Secondary | ICD-10-CM

## 2019-03-25 DIAGNOSIS — N186 End stage renal disease: Secondary | ICD-10-CM

## 2019-03-25 DIAGNOSIS — E872 Acidosis: Secondary | ICD-10-CM

## 2019-03-25 DIAGNOSIS — Z862 Personal history of diseases of the blood and blood-forming organs and certain disorders involving the immune mechanism: Secondary | ICD-10-CM

## 2019-03-25 DIAGNOSIS — Z79899 Other long term (current) drug therapy: Secondary | ICD-10-CM

## 2019-03-25 DIAGNOSIS — B259 Cytomegaloviral disease, unspecified: Secondary | ICD-10-CM | POA: Diagnosis not present

## 2019-03-25 DIAGNOSIS — Z09 Encounter for follow-up examination after completed treatment for conditions other than malignant neoplasm: Secondary | ICD-10-CM | POA: Insufficient documentation

## 2019-03-25 DIAGNOSIS — D899 Disorder involving the immune mechanism, unspecified: Secondary | ICD-10-CM | POA: Insufficient documentation

## 2019-03-25 DIAGNOSIS — Z9483 Pancreas transplant status: Secondary | ICD-10-CM | POA: Insufficient documentation

## 2019-03-25 DIAGNOSIS — E559 Vitamin D deficiency, unspecified: Secondary | ICD-10-CM | POA: Insufficient documentation

## 2019-03-25 DIAGNOSIS — Z789 Other specified health status: Secondary | ICD-10-CM | POA: Diagnosis not present

## 2019-03-25 DIAGNOSIS — N390 Urinary tract infection, site not specified: Secondary | ICD-10-CM | POA: Insufficient documentation

## 2019-03-25 DIAGNOSIS — Z114 Encounter for screening for human immunodeficiency virus [HIV]: Secondary | ICD-10-CM | POA: Insufficient documentation

## 2019-03-25 DIAGNOSIS — E1129 Type 2 diabetes mellitus with other diabetic kidney complication: Secondary | ICD-10-CM | POA: Diagnosis not present

## 2019-03-25 DIAGNOSIS — D631 Anemia in chronic kidney disease: Secondary | ICD-10-CM | POA: Insufficient documentation

## 2019-03-25 LAB — TACROLIMUS, TROUGH
Lab: 6.7
Lab: 7.8

## 2019-03-25 LAB — FERRITIN: Ferritin:MCnc:Pt:Ser/Plas:Qn:: 2720 — ABNORMAL HIGH

## 2019-03-25 LAB — BASIC METABOLIC PANEL
Anion gap: 11 (ref 5–15)
BLOOD UREA NITROGEN: 43 mg/dL — ABNORMAL HIGH
BUN: 43 mg/dL — ABNORMAL HIGH (ref 6–20)
CHLORIDE: 113 mmol/L — ABNORMAL HIGH
CO2: 16 mmol/L — ABNORMAL LOW (ref 22–32)
CO2: 16 mmol/L — ABNORMAL LOW
CREATININE: 6.14 mg/dL — ABNORMAL HIGH
Calcium: 11.4 mg/dL — ABNORMAL HIGH (ref 8.9–10.3)
Chloride: 113 mmol/L — ABNORMAL HIGH (ref 98–111)
Creatinine, Ser: 6.14 mg/dL — ABNORMAL HIGH (ref 0.61–1.24)
EGFR CKD-EPI AA MALE: 13 mL/min/{1.73_m2} — ABNORMAL LOW
GFR calc Af Amer: 13 mL/min — ABNORMAL LOW (ref >60)
GFR calc non Af Amer: 11 mL/min — ABNORMAL LOW (ref >60)
GLUCOSE RANDOM: 158 mg/dL — ABNORMAL HIGH
Glucose, Bld: 158 mg/dL — ABNORMAL HIGH (ref 70–99)
POTASSIUM: 5.5 mmol/L — ABNORMAL HIGH
Potassium: 5.5 mmol/L — ABNORMAL HIGH (ref 3.5–5.1)
SODIUM: 140 mmol/L
Sodium: 140 mmol/L (ref 135–145)

## 2019-03-25 LAB — IRON & TIBC
IRON SATURATION (CALC): 76 % — ABNORMAL HIGH (ref 20–50)
IRON: 187 ug/dL — ABNORMAL HIGH (ref 35–165)

## 2019-03-25 LAB — CBC W/ DIFFERENTIAL
BASOPHILS ABSOLUTE COUNT: 0 10*9/L
BASOPHILS RELATIVE PERCENT: 1 %
EOSINOPHILS ABSOLUTE COUNT: 0.1 10*9/L
EOSINOPHILS RELATIVE PERCENT: 2 %
HEMOGLOBIN: 8.4 g/dL — ABNORMAL LOW
LYMPHOCYTES ABSOLUTE COUNT: 0.1 10*9/L — ABNORMAL LOW
LYMPHOCYTES RELATIVE PERCENT: 1 %
MEAN CORPUSCULAR HEMOGLOBIN CONC: 33.2 g/dL
MEAN CORPUSCULAR VOLUME: 81.4 fL
MONOCYTES RELATIVE PERCENT: 9 %
NEUTROPHILS ABSOLUTE COUNT: 3.5 10*9/L
NEUTROPHILS RELATIVE PERCENT: 86 %
PLATELET COUNT: 329 10*9/L
RED BLOOD CELL COUNT: 3.11 10*12/L — ABNORMAL LOW
RED CELL DISTRIBUTION WIDTH: 17.5 % — ABNORMAL HIGH
WBC ADJUSTED: 4 10*9/L

## 2019-03-25 LAB — URINALYSIS
GLUCOSE UA: NEGATIVE
KETONES UA: NEGATIVE
PH UA: 5 (ref 5.0–9.0)
PROTEIN UA: 30 — AB
RBC UA: 24 /HPF — ABNORMAL HIGH (ref <=3)
SPECIFIC GRAVITY UA: 1.012 (ref 1.003–1.030)
SQUAMOUS EPITHELIAL: <1 /HPF (ref 0–5)
UROBILINOGEN UA: 0.2
WBC UA: 11 /HPF — ABNORMAL HIGH (ref <=2)

## 2019-03-25 LAB — MAGNESIUM
Lab: 1.6 — ABNORMAL LOW
Magnesium: 1.6 mg/dL — ABNORMAL LOW (ref 1.7–2.4)

## 2019-03-25 LAB — BILIRUBIN UA: Bilirubin:PrThr:Pt:Urine:Ord:Test strip: NEGATIVE

## 2019-03-25 LAB — PHOSPHORUS
Lab: 3.7
Phosphorus: 3.7 mg/dL (ref 2.5–4.6)

## 2019-03-25 LAB — CALCIUM: Lab: 11.4 — ABNORMAL HIGH

## 2019-03-25 LAB — PROTEIN / CREATININE RATIO, URINE
CREATININE, URINE: 143.2 mg/dL
PROTEIN URINE: 55.4 mg/dL

## 2019-03-25 LAB — PROTEIN/CREAT RATIO, URINE: Protein/Creatinine:MRto:Pt:Urine:Qn:: 0.387

## 2019-03-25 LAB — TRANSFERRIN: Transferrin:MCnc:Pt:Ser/Plas:Qn:: 194.7 — ABNORMAL LOW

## 2019-03-25 LAB — EOSINOPHILS RELATIVE PERCENT: Lab: 2

## 2019-03-25 LAB — CBC WITH DIFFERENTIAL/PLATELET
Abs Immature Granulocytes: 0.02 10*3/uL (ref 0.00–0.07)
Basophils Absolute: 0 10*3/uL (ref 0.0–0.1)
Basophils Relative: 1 %
Eosinophils Absolute: 0.1 10*3/uL (ref 0.0–0.5)
Eosinophils Relative: 2 %
HCT: 25.3 % — ABNORMAL LOW (ref 39.0–52.0)
Hemoglobin: 8.4 g/dL — ABNORMAL LOW (ref 13.0–17.0)
Immature Granulocytes: 1 %
Lymphocytes Relative: 1 %
Lymphs Abs: 0.1 10*3/uL — ABNORMAL LOW (ref 0.7–4.0)
MCH: 27 pg (ref 26.0–34.0)
MCHC: 33.2 g/dL (ref 30.0–36.0)
MCV: 81.4 fL (ref 80.0–100.0)
Monocytes Absolute: 0.4 10*3/uL (ref 0.1–1.0)
Monocytes Relative: 9 %
Neutro Abs: 3.5 10*3/uL (ref 1.7–7.7)
Neutrophils Relative %: 86 %
Platelets: 329 10*3/uL (ref 150–400)
RBC: 3.11 MIL/uL — ABNORMAL LOW (ref 4.22–5.81)
RDW: 17.5 % — ABNORMAL HIGH (ref 11.5–15.5)
WBC: 4 10*3/uL (ref 4.0–10.5)
nRBC: 0 % (ref 0.0–0.2)

## 2019-03-25 MED ORDER — FAMOTIDINE 20 MG TABLET
ORAL_TABLET | Freq: Every day | ORAL | 5 refills | 30 days | Status: CP
Start: 2019-03-25 — End: 2020-03-24
  Filled 2019-05-11: qty 30, 30d supply, fill #0

## 2019-03-25 MED ORDER — SODIUM BICARBONATE 650 MG TABLET
ORAL_TABLET | Freq: Two times a day (BID) | ORAL | 2 refills | 50.00000 days | Status: CP
Start: 2019-03-25 — End: 2020-03-24
  Filled 2019-03-25: qty 100, 50d supply, fill #0

## 2019-03-25 MED ORDER — MYCOPHENOLATE SODIUM 180 MG TABLET,DELAYED RELEASE
ORAL_TABLET | Freq: Two times a day (BID) | ORAL | 11 refills | 30.00000 days | Status: CP
Start: 2019-03-25 — End: 2020-03-24
  Filled 2019-05-11: qty 120, 30d supply, fill #0

## 2019-03-25 MED FILL — SODIUM BICARBONATE 650 MG TABLET: 50 days supply | Qty: 100 | Fill #0 | Status: AC

## 2019-03-25 NOTE — Unmapped (Signed)
[facility] PHARMACY NOTE  03/25/2019   Russell Wade  [id number]    Medication changes today:   1. Decrease Myfortic to 360mg  BID  2. Start sodium bicarb 650mg  BID  3. Decrease PhosLo to 2 capsules TID [facility]    Education/Adherence tools provided today:  1.provided updated medication list  2. provided additional education on immunosuppression and transplant related medications including reviewing indications of medications, dosing and side effects  3.  provided additional education on OTC meds/ supplements    Follow up items:  1. goal of understanding indications and dosing of immunosuppression medications  2. Envarsus level  3. GI tolerance with lower dose of Myfortic  4. CMV PCR    Next visit with pharmacy in 1 month  ____________________________________________________________________    Russell Wade is a 33 y.o. male s/p deceased kidney transplant on 26-Feb-2019 (Kidney), 11/12/2007 (Kidney) 2/2 Alport's syndrome. 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    Other PMH significant for parathyroidectomy with autotransplant    Post op course complicated by: DGF requiring maintenance on HD. Last HD 9/28    Seen by pharmacy today for: medication management and pill box fill and adherence education; last seen by pharmacy 2 weeks ago     CC:  Patient complains of diarrhea, SOB, and trouble sleeping     Vitals:    03/25/19 1224   BP: 120/84   Pulse: 84   Temp: 36.6 ??C (97.9 ??F)       Allergies   Allergen Reactions   ??? Tuberculin Ppd      Other reaction(s): Skin Rash       All medications reviewed and updated.     Medication list includes revisions made during today???s encounter    Outpatient Encounter Medications as of 03/25/2019   Medication Sig Dispense Refill   ??? acetaminophen (TYLENOL) 500 MG tablet Take 1-2 tablets (500-1,000 mg total) by mouth every six (6) hours as needed for pain or fever (> 38C). 100 tablet 11   ??? amLODIPine (NORVASC) 5 MG tablet Take 2 tablets (10 mg total) by mouth daily. 60 tablet 11   ??? aspirin (ECOTRIN) 81 MG tablet HOLD until directed to start in clinic. 30 tablet 11   ??? calcium acetate,phosphat bind, (PHOSLO) 667 mg capsule Take 2 capsules (1,334 mg total) by mouth Three (3) times a day with a meal. 180 capsule 11   ??? carvediloL (COREG) 6.25 MG tablet Take 1 tablet (6.25 mg total) by mouth Two (2) times a day. 60 tablet 0   ??? ergocalciferol (DRISDOL) 1,250 mcg (50,000 unit) capsule Take 1 capsule (50,000 Units total) by mouth once a week. 8 capsule 0   ??? famotidine (PEPCID) 20 MG tablet Take 1 tablet (20 mg total) by mouth daily. 30 tablet 5   ??? gabapentin (NEURONTIN) 100 MG capsule Take 1 capsule (100 mg total) by mouth nightly for 12 days. 12 capsule 0   ??? hydrOXYzine (ATARAX) 25 MG tablet Take 1 tablet (25 mg total) by mouth every eight (8) hours as needed for itching. 30 tablet 11   ??? loperamide (IMODIUM) 2 mg capsule Take 4 mg (2 tablets) at first onset of diarrhea then 2 mg (1 tablet) with additional diarrhea episodes as needed. Maximum 4 tablet per day. 60 capsule 11   ??? magnesium oxide-Mg AA chelate (MAGNESIUM, AMINO ACID CHELATE,) 133 mg Tab Take 1 tablet by mouth Two (2) times a day. HOLD until directed to start  by your coordinator. 100 tablet 11   ??? mycophenolate (MYFORTIC) 180 MG EC tablet Take 2 tablets (360 mg total) by mouth Two (2) times a day. 120 tablet 11   ??? sodium bicarbonate 650 mg tablet Take 1 tablet (650 mg total) by mouth Two (2) times a day. 100 tablet 2   ??? sulfamethoxazole-trimethoprim (BACTRIM) 400-80 mg per tablet Take 1 tablet (80 mg of trimethoprim total) by mouth Every Monday, Wednesday, and Friday. 12 tablet 5   ??? tacrolimus (ENVARSUS XR) 1 mg Tb24 extended release tablet Take 1 (1mg ) tablet with 5 (4mg ) tablets by mouth daily. Total daily dose = 21mg . 30 tablet 11   ??? tacrolimus (ENVARSUS XR) 4 mg Tb24 extended release tablet Take 5 (4mg ) tablets with 1 (1mg ) tablet by mouth daily. Total daily dose = 21mg . 150 tablet 11   ??? valGANciclovir (VALCYTE) 450 mg tablet Take 1 tablet (450 mg total) by mouth Every Monday and Thursday. 30 tablet 2   ??? zolpidem (AMBIEN) 10 mg tablet Take 10 mg by mouth nightly as needed for sleep.     ??? [DISCONTINUED] furosemide (LASIX) 40 MG tablet Take 1 tablet (40 mg total) by mouth daily for 3 days. 3 tablet 0   ??? [DISCONTINUED] MYFORTIC 180 mg EC tablet Take 3 tablets (540 mg total) by mouth Two (2) times a day. 180 tablet 11   ??? [DISCONTINUED] traMADoL (ULTRAM) 50 mg tablet Take 1-2 tablets (50-100 mg total) by mouth two (2) times a day as needed for other (pain). 30 tablet 0     No facility-administered encounter medications on file as of 03/25/2019.        Induction agent : alemtuzumab    CURRENT IMMUNOSUPPRESSION: tacrolimus 21 mg PO qd  prograf/Envarsus/cyclosporine goal: 8-10   myfortic540  mg PO bid    steroid free     Patient complains of diarrhea    IMMUNOSUPPRESSION DRUG LEVELS:  Lab Results   Component Value Date    Tacrolimus, Trough 6.7 03/23/2019    Tacrolimus, Trough 7.8 03/21/2019    Tacrolimus, Trough 11.1 03/18/2019    Tacrolimus, Trough <0.5 03/17/2014    Tacrolimus, Trough 7.7 04/05/2013    Tacrolimus, Trough 7.1 03/29/2013     No results found for: CYCLO  No results found for: EVEROLIMUS  No results found for: SIROLIMUS    Envarsus level is accurate 24 hour trough    Graft function: complicated by DGF  DSA: ntd  Biopsies to date: zero hour biopsy negative for diagnostic abnormalities  WBC/ANC:  wnl    Plan: Will decrease Myfortic to 360 BID. Follow up with CMV PCR and monitor for diarrhea resolution. Educated patient to take Envarsus on an empty stomach    OI Prophylaxis:   CMV Status: D+/ R+, moderate risk . CMV prophylaxis: valganciclovir 450 mg two days per week (Mon/Thur) x 3 months per protocol.  Lab Results   Component Value Date    CMV Quant 1468 08/22/2008     PCP Prophylaxis: bactrim SS 1 tab MWF x 6 months.  Thrush: completed in hospital  Patient is  tolerating infectious prophylaxis well    Plan: Continue per protocol. Continue to monitor.    CV Prophylaxis: holding asa 81 mg   The ASCVD Risk score Denman George DC Montez Hageman, et al., 2013) failed to calculate.  Statin therapy: Not indicated given age; currently on no statin  Plan: consider starting statin at a later date. Continue to monitor. Pt reports SOB and chest tightness-  continue to monitor.      BP: Goal < 140/90. Clinic vitals reported above  Home BP ranges: 130-150s/90s  HR: 90s  Current meds include: amlodipine 10 mg daily, carvedilol 6.25mg  BID.  Plan: within goal, occasionally elevated. Continue to monitor    Anemia of CKD:  H/H:   Lab Results   Component Value Date    HGB 8.4 (L) 03/25/2019     Lab Results   Component Value Date    HCT 25.3 (L) 03/25/2019     Iron panel:  Lab Results   Component Value Date    IRON 187 (H) 03/25/2019    TIBC 245.3 (L) 03/25/2019    FERRITIN 2,720.0 (H) 03/25/2019     Lab Results   Component Value Date    Iron Saturation (%) 76 (H) 03/25/2019    Iron Saturation (%) 15 (L) 03/21/2014       Prior ESA use: unclear if still getting at HD    Plan: stable. Continue to monitor.     DM:   Lab Results   Component Value Date    A1C 4.9 02/20/2019   . Goal A1c < 7  History of Dm? No  Diet:did not address  Exercise:not yet  Hypoglycemia: no   Fluid intake: 4-5 bottles daily  Plan:  Continue to monitor    Electrolytes: Phos 3.7, K 5.5, Ca 11.4, CO2 16  Meds currently on: Phoslo 3 capsulesTID AC  Plan: Start sodium bicarb 650mg  BID.  Decrease PhosLo to 2 capsules TID - can likely DC soon if SCr continues to improve. Monitor Calcium.  Of note, he did not tolerate Sensipar pre transplant due to GI effects.  Continue to monitor     GI/BM: pt reports diarrhea and occasional heartburn  Meds currently on: loperamide (pt does not think it is effective), using pepto bismol for diarrhea  Plan: Restart famotidine 20mg  once daily. Continue to monitor, may continue using pepto for symptoms    Pain: pt reports moderate incisional site pain  Meds currently on: APAP PRN (using 1g daily), tramadol PRN, gabapentin 100 mg HS  Plan: Continue to monitor    Sleep: Pt reports difficulty sleeping  Meds currently on: zolpidem 10mg  nightly, pt taking Zzzquil for sleep  Plan: Continue to monitor    Bone health:   Vitamin D Level: 9.6 on 03/02/19. Goal > 30.   Last DEXA results:  none available  Current meds include: ergocalciferol 50,000 units weeklyy  Plan: Vitamin D level  out of goal,continue supplementation. Continue to monitor.     Women's/Men's Health:  Shaquelle Hernon is a 33 y.o. male. Patient reports no men's/women's health issues  Plan: Continue to monitor    Itching  Meds currently on: hydroxyzine PRN (hasn't needed)  Plan: continue to monitor    Pharmacy preference:  [facility]    Adherence: Patient has average understanding of medications; was able to independently identify names/doses of immunosuppressants and OI meds.  Patient  does fill their own pill box on a regular basis at home.  Patient brought medication card:no  Pill box:did not bring  Plan: ; provided extensive adherence counseling/intervention    Spent approximately 40 minutes on educating this patient and greater than 50% was spent in direct face to face counseling regarding post transplant medication education. Questions and concerns were address to patient's satisfaction.    Patient was reviewed with Dr. Gwynneth Munson who was agreement with the stated plan:     During this visit, the  following was completed:   BG log data assessment  BP log data assessment  Labs ordered and evaluated  complex treatment plan >1 DS   Patient education was completed for 11-24 minutes     All questions/concerns were addressed to the patient's satisfaction.  __________________________________________  [place], PHARMD CANDIDATE    Cleone Slim, PHARMD, CPP  [employer]ID [employer] TRANSPLANT CLINICAL PHARMACIST PRACTITIONER  PAGER [phone]

## 2019-03-25 NOTE — Unmapped (Signed)
Transplant Nephrology Clinic Visit    History of Present Illness    33 y.o. male here for follow up after kidney transplantation.      Transplant History:  ??  Organ Received: Left DDT, DCD, KDPI: 36%; 16 hrs cold ischemia time  Native Kidney Disease: Alport's Syndrome  CPRA: 87  Date of Transplant: 02/20/19  Post-Transplant Course: DGF, last hemodialysis on 03/12/19--discontinued after increase in urine output with stable creatinine.  Prior Transplants: Yes  Induction: [place]  Date of Ureteral Stent Removal: 03/28/2019  Current Immunosuppression: Envarsus/Myfrotic  CMV/EBV Status: CMV D+/R+  EBV D+/R+  Valcyte 450 mg twice a week  Rejection Episodes: None  Donor Specific Antibodies: None  Results of Renal Imaging (pre and post):   Pre-Transplant 03/22/18  Small echogenic kidneys bilaterally, consistent with medical renal disease. 2 subcentimeter left renal cysts measuring up to 0.6 x 0.6 x 0.7 cm. No solid masses or calculi. No hydronephrosis  ??  Post-Transplant 02/20/19 (day of txp)  -Resistive indices in the left lower quadrant renal transplant arteries within normal limits.??  -Mildly increase in velocities noted within the main renal artery at the level of the anastomosis, likely postsurgical. Attention on follow-up.??  -Small perinephric fluid collection noted along the inferior pole transplant kidney measuring 2.3 x 3.2 x 0.8 cm as well as small amount of free fluid seen in the left upper quadrant, likely postoperative.      Current Immunosuppression Regimen:   Envarsus 21 mg daily  Myfotic 540 mg BID      Subjective/Interval:     The patient reports shortness of breath with exertion walking about 100 feet. Some tightness with shortness of breath, but no chest pain, no chest heaviness.    Occasional metallic taste in mouth. Diarrhea, nausea, and vomiting--Myfortic was reduced to 540 mg BID on 10/1 but symptoms did not improve much. Vomits about twice per week. Diarrhea 3-5 times per day (loose, mushy). No fevers, no chills, no pain swallowing.   Cr trending down based on today's labs done at [facility] (see below).         03/14/19 Kidney biopsy showed diffuse subacute tubular injury, no evidence for rejection (C4d negative)        Review of Systems    Otherwise as per HPI, all other systems reviewed and are negative.    Medications  Current Outpatient Medications   Medication Sig Dispense Refill   ??? acetaminophen (TYLENOL) 500 MG tablet Take 1-2 tablets (500-1,000 mg total) by mouth every six (6) hours as needed for pain or fever (> 38C). 100 tablet 11   ??? amLODIPine (NORVASC) 5 MG tablet Take 2 tablets (10 mg total) by mouth daily. 60 tablet 11   ??? aspirin (ECOTRIN) 81 MG tablet HOLD until directed to start in clinic. 30 tablet 11   ??? calcium acetate,phosphat bind, (PHOSLO) 667 mg capsule Take 2 capsules (1,334 mg total) by mouth Three (3) times a day with a meal. 180 capsule 11   ??? carvediloL (COREG) 6.25 MG tablet Take 1 tablet (6.25 mg total) by mouth Two (2) times a day. 60 tablet 0   ??? ergocalciferol (DRISDOL) 1,250 mcg (50,000 unit) capsule Take 1 capsule (50,000 Units total) by mouth once a week. 8 capsule 0   ??? furosemide (LASIX) 40 MG tablet Take 1 tablet (40 mg total) by mouth daily for 3 days. 3 tablet 0   ??? gabapentin (NEURONTIN) 100 MG capsule Take 1 capsule (100 mg total) by mouth nightly for  12 days. 12 capsule 0   ??? hydrOXYzine (ATARAX) 25 MG tablet Take 1 tablet (25 mg total) by mouth every eight (8) hours as needed for itching. 30 tablet 11   ??? loperamide (IMODIUM) 2 mg capsule Take 4 mg (2 tablets) at first onset of diarrhea then 2 mg (1 tablet) with additional diarrhea episodes as needed. Maximum 4 tablet per day. 60 capsule 11   ??? magnesium oxide-Mg AA chelate (MAGNESIUM, AMINO ACID CHELATE,) 133 mg Tab Take 1 tablet by mouth Two (2) times a day. HOLD until directed to start by your coordinator. 100 tablet 11   ??? MYFORTIC 180 mg EC tablet Take 3 tablets (540 mg total) by mouth Two (2) times a day. 180 tablet 11 ??? sulfamethoxazole-trimethoprim (BACTRIM) 400-80 mg per tablet Take 1 tablet (80 mg of trimethoprim total) by mouth Every Monday, Wednesday, and Friday. 12 tablet 5   ??? tacrolimus (ENVARSUS XR) 1 mg Tb24 extended release tablet Take 1 (1mg ) tablet with 5 (4mg ) tablets by mouth daily. Total daily dose = 21mg . 30 tablet 11   ??? tacrolimus (ENVARSUS XR) 4 mg Tb24 extended release tablet Take 5 (4mg ) tablets with 1 (1mg ) tablet by mouth daily. Total daily dose = 21mg . 150 tablet 11   ??? traMADoL (ULTRAM) 50 mg tablet Take 1-2 tablets (50-100 mg total) by mouth two (2) times a day as needed for other (pain). 30 tablet 0   ??? valGANciclovir (VALCYTE) 450 mg tablet Take 1 tablet (450 mg total) by mouth Every Monday and Thursday. 30 tablet 2   ??? zolpidem (AMBIEN) 10 mg tablet Take 10 mg by mouth nightly as needed for sleep.       No current facility-administered medications for this visit.            Physical Exam  BP 120/84 (BP Site: R Arm, BP Position: Sitting, BP Cuff Size: Medium)  - Pulse 84  - Temp 36.6 ??C (97.9 ??F) (Temporal)  - Ht 175.3 cm (5' 9)  - Wt 60.7 kg (133 lb 12.8 oz)  - BMI 19.76 kg/m??   General: no acute distress  HEENT: mucous membranes moist, PERRL  Neck: neck supple, no cervical lymphadenopathy appreciated  CV: normal rate, normal rhythm, no murmur, no gallops, no rubs appreciated  Lungs: clear to auscultation bilaterally  Abdomen: soft, non tender  Extremities: no edema  Musculoskeletal: no visible deformity, normal range of motion.  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       Recommendations/Plan:     Allograft Function, s/p DDKT: stable/improving, has not required hemodialysis since 9/26. He was discharged from his outpatient dialysis unit by Dr. Arlyn Leak. Add sodium bicarb 650 mg BID today for metabolic acidosis. Weight is decreasing, volume status good. I do not believe his GI symptoms are uremia. There are no apparent indications for dialysis at this time, will continue to monitor closely.     Anemia due to chronic kidney disease: I suspect this is contributing to his on-going fatigue. Will assess iron studies today and provide supplementation if required. If iron studies are normal, will arrange for ESA dosing.     Immunosuppression Management [High Risk  Medical Decision Making For Drug Therapy Requiring Intensive Monitoring For Toxicity]: trough level has ranged 8-11 ng/mL at this dose of envarsus, will continue current dose. Reduce Myfortic to 360 mg BID due to GI symptoms and monitor.     Blood Pressure Management: normotensive in clinic today. No new intervention    Lipid Management: LDL 72, HDL 35 in Sept 2020. No new intervention.    Infectious Prophylaxis and Monitoring: given GI symptoms, will check CMV PCR today, was negative 03/18/19. 9/21 urine cytology unremarkable        Counseling:  I counseled the patient on:  The need to avoid sun exposure and the use of sunblock while outdoors given the relatively higher risk of skin malignancy in an immunosuppressed state.  The need for adherence to immunosuppression medication.  Patient verbalized understanding.     Follow-Up:  Return to clinic in 4 weeks  Patient will continue to follow-up with his primary care provider for non-transplant related issues and medication refills. We have ordered transplant specific labs per the center's guidelines to monitor and assess for toxicities from immunosuppressant drug therapy          Clelia Croft, DO  Transplant Nephrology  [facility] Division of Nephrology and Hypertension  03/25/2019  12:12 PM

## 2019-03-26 LAB — TACROLIMUS LEVEL: Tacrolimus (FK506) - LabCorp: 12.3 ng/mL (ref 2.0–20.0)

## 2019-03-28 ENCOUNTER — Other Ambulatory Visit (HOSPITAL_COMMUNITY)
Admission: AD | Admit: 2019-03-28 | Discharge: 2019-03-28 | Disposition: A | Discharge status: Home / Self Care | Payer: Medicare Other | Source: Ambulatory Visit | Attending: Nephrology | Admitting: Nephrology

## 2019-03-28 ENCOUNTER — Ambulatory Visit: Admit: 2019-03-28 | Discharge: 2019-03-29 | Payer: MEDICARE | Attending: Urology | Primary: Urology

## 2019-03-28 DIAGNOSIS — N186 End stage renal disease: Principal | ICD-10-CM

## 2019-03-28 DIAGNOSIS — Z992 Dependence on renal dialysis: Principal | ICD-10-CM

## 2019-03-28 DIAGNOSIS — Z940 Kidney transplant status: Principal | ICD-10-CM

## 2019-03-28 DIAGNOSIS — Z9483 Pancreas transplant status: Secondary | ICD-10-CM | POA: Diagnosis not present

## 2019-03-28 DIAGNOSIS — Z7982 Long term (current) use of aspirin: Secondary | ICD-10-CM | POA: Diagnosis not present

## 2019-03-28 DIAGNOSIS — Z4822 Encounter for aftercare following kidney transplant: Secondary | ICD-10-CM | POA: Diagnosis not present

## 2019-03-28 DIAGNOSIS — E559 Vitamin D deficiency, unspecified: Secondary | ICD-10-CM | POA: Insufficient documentation

## 2019-03-28 DIAGNOSIS — Z87891 Personal history of nicotine dependence: Secondary | ICD-10-CM | POA: Diagnosis not present

## 2019-03-28 DIAGNOSIS — E1129 Type 2 diabetes mellitus with other diabetic kidney complication: Secondary | ICD-10-CM | POA: Diagnosis not present

## 2019-03-28 DIAGNOSIS — Z79899 Other long term (current) drug therapy: Secondary | ICD-10-CM | POA: Diagnosis not present

## 2019-03-28 DIAGNOSIS — Z114 Encounter for screening for human immunodeficiency virus [HIV]: Secondary | ICD-10-CM | POA: Diagnosis not present

## 2019-03-28 DIAGNOSIS — Z789 Other specified health status: Secondary | ICD-10-CM | POA: Diagnosis not present

## 2019-03-28 DIAGNOSIS — N390 Urinary tract infection, site not specified: Secondary | ICD-10-CM | POA: Diagnosis not present

## 2019-03-28 DIAGNOSIS — B259 Cytomegaloviral disease, unspecified: Secondary | ICD-10-CM | POA: Diagnosis not present

## 2019-03-28 DIAGNOSIS — I120 Hypertensive chronic kidney disease with stage 5 chronic kidney disease or end stage renal disease: Secondary | ICD-10-CM | POA: Diagnosis not present

## 2019-03-28 DIAGNOSIS — D899 Disorder involving the immune mechanism, unspecified: Secondary | ICD-10-CM | POA: Insufficient documentation

## 2019-03-28 DIAGNOSIS — Z09 Encounter for follow-up examination after completed treatment for conditions other than malignant neoplasm: Secondary | ICD-10-CM | POA: Diagnosis not present

## 2019-03-28 DIAGNOSIS — D631 Anemia in chronic kidney disease: Secondary | ICD-10-CM | POA: Diagnosis not present

## 2019-03-28 LAB — TACROLIMUS, TROUGH: Lab: 12.3

## 2019-03-28 LAB — CBC WITH DIFFERENTIAL/PLATELET
Abs Immature Granulocytes: 0.02 10*3/uL (ref 0.00–0.07)
Basophils Absolute: 0 10*3/uL (ref 0.0–0.1)
Basophils Relative: 1 %
Eosinophils Absolute: 0.1 10*3/uL (ref 0.0–0.5)
Eosinophils Relative: 2 %
HCT: 23.6 % — ABNORMAL LOW (ref 39.0–52.0)
Hemoglobin: 7.5 g/dL — ABNORMAL LOW (ref 13.0–17.0)
Immature Granulocytes: 1 %
Lymphocytes Relative: 1 %
Lymphs Abs: 0 10*3/uL — ABNORMAL LOW (ref 0.7–4.0)
MCH: 25.9 pg — ABNORMAL LOW (ref 26.0–34.0)
MCHC: 31.8 g/dL (ref 30.0–36.0)
MCV: 81.4 fL (ref 80.0–100.0)
Monocytes Absolute: 0.3 10*3/uL (ref 0.1–1.0)
Monocytes Relative: 10 %
Neutro Abs: 2.6 10*3/uL (ref 1.7–7.7)
Neutrophils Relative %: 85 %
Platelets: 242 10*3/uL (ref 150–400)
RBC: 2.9 MIL/uL — ABNORMAL LOW (ref 4.22–5.81)
RDW: 17.9 % — ABNORMAL HIGH (ref 11.5–15.5)
WBC: 3 10*3/uL — ABNORMAL LOW (ref 4.0–10.5)
nRBC: 0 % (ref 0.0–0.2)

## 2019-03-28 LAB — BASIC METABOLIC PANEL
Anion gap: 7 (ref 5–15)
BUN: 38 mg/dL — ABNORMAL HIGH (ref 6–20)
CO2: 17 mmol/L — ABNORMAL LOW (ref 22–32)
Calcium: 11.2 mg/dL — ABNORMAL HIGH (ref 8.9–10.3)
Chloride: 115 mmol/L — ABNORMAL HIGH (ref 98–111)
Creatinine, Ser: 5.4 mg/dL — ABNORMAL HIGH (ref 0.61–1.24)
GFR calc Af Amer: 15 mL/min — ABNORMAL LOW (ref >60)
GFR calc non Af Amer: 13 mL/min — ABNORMAL LOW (ref >60)
Glucose, Bld: 120 mg/dL — ABNORMAL HIGH (ref 70–99)
Potassium: 5.9 mmol/L — ABNORMAL HIGH (ref 3.5–5.1)
Sodium: 139 mmol/L (ref 135–145)

## 2019-03-28 LAB — MAGNESIUM: Magnesium: 1.7 mg/dL (ref 1.7–2.4)

## 2019-03-28 LAB — PHOSPHORUS: Phosphorus: 3.6 mg/dL (ref 2.5–4.6)

## 2019-03-28 NOTE — Unmapped (Signed)
[facility] Specialty Medication Onboarding    Specialty Medication: MYFORTIC 180MG  TABLETS  Prior Authorization: Not Required   Financial Assistance: No - copay  <$25  Final Copay/Day Supply: $0 / 30 DAYS    Insurance Restrictions: Yes - max 1 month supply     Notes to Pharmacist: DOSE CHANGE    The triage team has completed the benefits investigation and has determined that the patient is able to fill this medication at [facility]. Please contact the patient to complete the onboarding or follow up with the prescribing physician as needed.

## 2019-03-28 NOTE — Unmapped (Signed)
[facility] [phone]

## 2019-03-29 ENCOUNTER — Ambulatory Visit: Admit: 2019-03-29 | Discharge: 2019-03-29 | Payer: MEDICARE

## 2019-03-29 ENCOUNTER — Institutional Professional Consult (permissible substitution): Admit: 2019-03-29 | Discharge: 2019-03-29 | Payer: MEDICARE

## 2019-03-29 DIAGNOSIS — Z4822 Encounter for aftercare following kidney transplant: Secondary | ICD-10-CM | POA: Diagnosis not present

## 2019-03-29 DIAGNOSIS — Z79899 Other long term (current) drug therapy: Secondary | ICD-10-CM | POA: Diagnosis not present

## 2019-03-29 LAB — RENAL FUNCTION PANEL
ALBUMIN: 4.5 g/dL (ref 3.5–5.0)
ANION GAP: 10 mmol/L (ref 7–15)
BLOOD UREA NITROGEN: 38 mg/dL — ABNORMAL HIGH (ref 7–21)
CALCIUM: 11.2 mg/dL — ABNORMAL HIGH (ref 8.5–10.2)
CHLORIDE: 113 mmol/L — ABNORMAL HIGH (ref 98–107)
CO2: 16 mmol/L — ABNORMAL LOW (ref 22.0–30.0)
CREATININE: 5.23 mg/dL — ABNORMAL HIGH (ref 0.70–1.30)
EGFR CKD-EPI AA MALE: 15 mL/min/{1.73_m2} — ABNORMAL LOW (ref >=60)
EGFR CKD-EPI NON-AA MALE: 13 mL/min/{1.73_m2} — ABNORMAL LOW (ref >=60)
GLUCOSE RANDOM: 104 mg/dL (ref 70–179)
POTASSIUM: 5.7 mmol/L — ABNORMAL HIGH (ref 3.5–5.0)
SODIUM: 139 mmol/L (ref 135–145)

## 2019-03-29 LAB — CBC W/ DIFFERENTIAL
BASOPHILS RELATIVE PERCENT: 1 %
EOSINOPHILS ABSOLUTE COUNT: 0.1 10*9/L
EOSINOPHILS RELATIVE PERCENT: 2 %
HEMATOCRIT: 23.6 % — ABNORMAL LOW
HEMOGLOBIN: 7.5 g/dL — ABNORMAL LOW
LYMPHOCYTES ABSOLUTE COUNT: 0 10*9/L — ABNORMAL LOW
LYMPHOCYTES RELATIVE PERCENT: 1 %
MEAN CORPUSCULAR HEMOGLOBIN CONC: 31.8 g/dL
MEAN CORPUSCULAR HEMOGLOBIN: 25.9 pg — ABNORMAL LOW
MEAN CORPUSCULAR VOLUME: 81.4 fL
MONOCYTES ABSOLUTE COUNT: 0.3 10*9/L
NEUTROPHILS RELATIVE PERCENT: 85 %
PLATELET COUNT: 242 10*9/L
RED BLOOD CELL COUNT: 2.9 10*12/L — ABNORMAL LOW
RED CELL DISTRIBUTION WIDTH: 17.9 % — ABNORMAL HIGH
WBC ADJUSTED: 3 10*9/L — ABNORMAL LOW

## 2019-03-29 LAB — BASIC METABOLIC PANEL
BLOOD UREA NITROGEN: 38 mg/dL — ABNORMAL HIGH
CO2: 17 mmol/L — ABNORMAL LOW
CREATININE: 5.4 mg/dL — ABNORMAL HIGH
EGFR CKD-EPI AA MALE: 15 mL/min/{1.73_m2} — ABNORMAL LOW
GLUCOSE RANDOM: 120 mg/dL — ABNORMAL HIGH
POTASSIUM: 5.9 mmol/L — ABNORMAL HIGH
SODIUM: 139 mmol/L

## 2019-03-29 LAB — LARGE UNSTAINED CELLS: Lab: 2

## 2019-03-29 LAB — CHLORIDE: Chloride:SCnc:Pt:Ser/Plas:Qn:: 113 — ABNORMAL HIGH

## 2019-03-29 LAB — CBC W/ AUTO DIFF
BASOPHILS ABSOLUTE COUNT: 0 10*9/L (ref 0.0–0.1)
BASOPHILS RELATIVE PERCENT: 1.1 %
EOSINOPHILS ABSOLUTE COUNT: 0.1 10*9/L (ref 0.0–0.4)
EOSINOPHILS RELATIVE PERCENT: 2.3 %
HEMOGLOBIN: 7.8 g/dL — ABNORMAL LOW (ref 13.5–17.5)
LYMPHOCYTES ABSOLUTE COUNT: 0.1 10*9/L — ABNORMAL LOW (ref 1.5–5.0)
LYMPHOCYTES RELATIVE PERCENT: 3.7 %
MEAN CORPUSCULAR HEMOGLOBIN CONC: 31.1 g/dL (ref 31.0–37.0)
MEAN CORPUSCULAR HEMOGLOBIN: 26.3 pg (ref 26.0–34.0)
MEAN CORPUSCULAR VOLUME: 84.5 fL (ref 80.0–100.0)
MEAN PLATELET VOLUME: 10.1 fL — ABNORMAL HIGH (ref 7.0–10.0)
MONOCYTES ABSOLUTE COUNT: 0.2 10*9/L (ref 0.2–0.8)
MONOCYTES RELATIVE PERCENT: 8.1 %
NEUTROPHILS ABSOLUTE COUNT: 2.3 10*9/L (ref 2.0–7.5)
NEUTROPHILS RELATIVE PERCENT: 82.9 %
PLATELET COUNT: 234 10*9/L (ref 150–440)
RED BLOOD CELL COUNT: 2.97 10*12/L — ABNORMAL LOW (ref 4.50–5.90)
RED CELL DISTRIBUTION WIDTH: 19.6 % — ABNORMAL HIGH (ref 12.0–15.0)

## 2019-03-29 LAB — CREATININE: Lab: 5.4 — ABNORMAL HIGH

## 2019-03-29 LAB — CMV DNA, QUANTITATIVE, PCR: CMV VIRAL LD: NOT DETECTED

## 2019-03-29 LAB — RED BLOOD CELL COUNT: Lab: 2.9 — ABNORMAL LOW

## 2019-03-29 LAB — MAGNESIUM
Lab: 1.7
Magnesium:MCnc:Pt:Ser/Plas:Qn:: 1.6

## 2019-03-29 LAB — PHOSPHORUS: Lab: 3.6

## 2019-03-29 LAB — CMV COMMENT: Lab: 0

## 2019-03-29 LAB — TACROLIMUS BLOOD: Lab: 6.6

## 2019-03-29 MED ORDER — CARVEDILOL 6.25 MG TABLET: 6 mg | tablet | Freq: Two times a day (BID) | 3 refills | 90 days | Status: AC

## 2019-03-29 NOTE — Unmapped (Signed)
Reviewed labs with mincemoyer. No prograf changes last level 12.3; will follow up on repeat labs

## 2019-03-29 NOTE — Unmapped (Signed)
Pt request for RX Refill

## 2019-03-29 NOTE — Unmapped (Signed)
Per providers orders, Aranesp 200 mcg was administered.  Patient tolerated it well with no complication.  See MAR for administration info.

## 2019-03-30 ENCOUNTER — Other Ambulatory Visit (HOSPITAL_COMMUNITY)
Admission: RE | Admit: 2019-03-30 | Discharge: 2019-03-30 | Disposition: A | Discharge status: Home / Self Care | Payer: Medicare Other | Source: Other Acute Inpatient Hospital | Attending: Nephrology | Admitting: Nephrology

## 2019-03-30 DIAGNOSIS — E559 Vitamin D deficiency, unspecified: Secondary | ICD-10-CM | POA: Diagnosis not present

## 2019-03-30 DIAGNOSIS — Z114 Encounter for screening for human immunodeficiency virus [HIV]: Secondary | ICD-10-CM | POA: Insufficient documentation

## 2019-03-30 DIAGNOSIS — N390 Urinary tract infection, site not specified: Secondary | ICD-10-CM | POA: Diagnosis not present

## 2019-03-30 DIAGNOSIS — D899 Disorder involving the immune mechanism, unspecified: Secondary | ICD-10-CM | POA: Insufficient documentation

## 2019-03-30 DIAGNOSIS — Z789 Other specified health status: Secondary | ICD-10-CM | POA: Insufficient documentation

## 2019-03-30 DIAGNOSIS — D631 Anemia in chronic kidney disease: Secondary | ICD-10-CM | POA: Insufficient documentation

## 2019-03-30 DIAGNOSIS — Z79899 Other long term (current) drug therapy: Secondary | ICD-10-CM | POA: Diagnosis not present

## 2019-03-30 DIAGNOSIS — Z940 Kidney transplant status: Secondary | ICD-10-CM | POA: Diagnosis not present

## 2019-03-30 DIAGNOSIS — Z09 Encounter for follow-up examination after completed treatment for conditions other than malignant neoplasm: Secondary | ICD-10-CM | POA: Insufficient documentation

## 2019-03-30 DIAGNOSIS — B259 Cytomegaloviral disease, unspecified: Secondary | ICD-10-CM | POA: Insufficient documentation

## 2019-03-30 DIAGNOSIS — Z9483 Pancreas transplant status: Secondary | ICD-10-CM | POA: Diagnosis not present

## 2019-03-30 DIAGNOSIS — E1129 Type 2 diabetes mellitus with other diabetic kidney complication: Secondary | ICD-10-CM | POA: Diagnosis not present

## 2019-03-30 LAB — CBC W/ DIFFERENTIAL
BASOPHILS ABSOLUTE COUNT: 0 10*9/L
BASOPHILS RELATIVE PERCENT: 1 %
EOSINOPHILS ABSOLUTE COUNT: 0.1 10*9/L
EOSINOPHILS RELATIVE PERCENT: 1 %
HEMATOCRIT: 24.1 % — ABNORMAL LOW
HEMOGLOBIN: 7.9 g/dL — ABNORMAL LOW
LYMPHOCYTES ABSOLUTE COUNT: 0.1 10*9/L — ABNORMAL LOW
LYMPHOCYTES RELATIVE PERCENT: 1 %
MEAN CORPUSCULAR HEMOGLOBIN: 26.8 pg
MEAN CORPUSCULAR VOLUME: 81.7 fL
MONOCYTES ABSOLUTE COUNT: 0.5 10*9/L
NEUTROPHILS ABSOLUTE COUNT: 4.7 10*9/L
NEUTROPHILS RELATIVE PERCENT: 88 %
PLATELET COUNT: 265 10*9/L
RED BLOOD CELL COUNT: 2.95 10*12/L — ABNORMAL LOW
RED CELL DISTRIBUTION WIDTH: 17.9 % — ABNORMAL HIGH
WBC ADJUSTED: 5.3 10*9/L

## 2019-03-30 LAB — PHOSPHORUS
Lab: 3
Phosphorus: 3 mg/dL (ref 2.5–4.6)

## 2019-03-30 LAB — BASIC METABOLIC PANEL
Anion gap: 8 (ref 5–15)
BLOOD UREA NITROGEN: 33 mg/dL — ABNORMAL HIGH
BUN: 33 mg/dL — ABNORMAL HIGH (ref 6–20)
CALCIUM: 11.2 mg/dL — ABNORMAL HIGH
CHLORIDE: 114 mmol/L — ABNORMAL HIGH
CO2: 16 mmol/L — ABNORMAL LOW (ref 22–32)
CO2: 16 mmol/L — ABNORMAL LOW
CREATININE: 5.01 mg/dL — ABNORMAL HIGH
Calcium: 11.2 mg/dL — ABNORMAL HIGH (ref 8.9–10.3)
Chloride: 114 mmol/L — ABNORMAL HIGH (ref 98–111)
Creatinine, Ser: 5.01 mg/dL — ABNORMAL HIGH (ref 0.61–1.24)
EGFR CKD-EPI AA MALE: 16 mL/min/{1.73_m2} — ABNORMAL LOW
GFR calc Af Amer: 16 mL/min — ABNORMAL LOW (ref >60)
GFR calc non Af Amer: 14 mL/min — ABNORMAL LOW (ref >60)
GLUCOSE RANDOM: 102 mg/dL — ABNORMAL HIGH
Glucose, Bld: 102 mg/dL — ABNORMAL HIGH (ref 70–99)
POTASSIUM: 6 mmol/L — ABNORMAL HIGH
Potassium: 6 mmol/L — ABNORMAL HIGH (ref 3.5–5.1)
SODIUM: 138 mmol/L
Sodium: 138 mmol/L (ref 135–145)

## 2019-03-30 LAB — EGFR CKD-EPI NON-AA MALE: Lab: 0

## 2019-03-30 LAB — TACROLIMUS, TROUGH: Lab: 7.2

## 2019-03-30 LAB — MAGNESIUM
Lab: 1.7
Magnesium: 1.7 mg/dL (ref 1.7–2.4)

## 2019-03-30 LAB — WHITE BLOOD CELL COUNT: Lab: 5.3

## 2019-03-30 LAB — CBC WITH DIFFERENTIAL/PLATELET
Abs Immature Granulocytes: 0.02 10*3/uL (ref 0.00–0.07)
Basophils Absolute: 0 10*3/uL (ref 0.0–0.1)
Basophils Relative: 1 %
Eosinophils Absolute: 0.1 10*3/uL (ref 0.0–0.5)
Eosinophils Relative: 1 %
HCT: 24.1 % — ABNORMAL LOW (ref 39.0–52.0)
Hemoglobin: 7.9 g/dL — ABNORMAL LOW (ref 13.0–17.0)
Immature Granulocytes: 0 %
Lymphocytes Relative: 1 %
Lymphs Abs: 0.1 10*3/uL — ABNORMAL LOW (ref 0.7–4.0)
MCH: 26.8 pg (ref 26.0–34.0)
MCHC: 32.8 g/dL (ref 30.0–36.0)
MCV: 81.7 fL (ref 80.0–100.0)
Monocytes Absolute: 0.5 10*3/uL (ref 0.1–1.0)
Monocytes Relative: 9 %
Neutro Abs: 4.7 10*3/uL (ref 1.7–7.7)
Neutrophils Relative %: 88 %
Platelets: 265 10*3/uL (ref 150–400)
RBC: 2.95 MIL/uL — ABNORMAL LOW (ref 4.22–5.81)
RDW: 17.9 % — ABNORMAL HIGH (ref 11.5–15.5)
WBC: 5.3 10*3/uL (ref 4.0–10.5)
nRBC: 0 % (ref 0.0–0.2)

## 2019-03-30 LAB — TACROLIMUS LEVEL: Tacrolimus (FK506) - LabCorp: 7.2 ng/mL (ref 2.0–20.0)

## 2019-03-30 MED ORDER — SODIUM POLYSTYRENE SULFONATE 15 GRAM/60 ML ORAL SUSPENSION: 15 g | mL | Freq: Every day | 0 refills | 2 days | Status: AC

## 2019-03-30 MED ORDER — FUROSEMIDE 40 MG TABLET: 20 mg | tablet | Freq: Every day | 11 refills | 60 days | Status: AC

## 2019-03-30 NOTE — Unmapped (Signed)
Potassium 6.0 reviewed with Dr Nestor Lewandowsky. Pt advised to decrease amlodipine to 5mg  today, take 60 mg lasix once and 30 mg kayexalate for two days

## 2019-03-31 LAB — TACROLIMUS LEVEL: Tacrolimus (FK506) - LabCorp: 7.8 ng/mL (ref 2.0–20.0)

## 2019-03-31 NOTE — Unmapped (Signed)
ASSESSMENT:  s/p DDRT 02/20/19  Cysto/stent removal today    PLAN:  Fu with transplant as needed.    ----    HISTORY OF PRESENT ILLNESS:   A 33 y.o.-year-old male seen today in consultation at the request of Family, [facility] for indwelling ureteral stent.    s/p DDRT 02/20/19  Doing well, no major issues.  No back pain, dysuria, hematuria.    I review history elements and review of systems on new patient intake form.    PAST MEDICAL HISTORY:   Past Medical History:   Diagnosis Date   ??? Alport syndrome    ??? ESRD (end stage renal disease) (CMS-HCC)    ??? Hearing impaired    ??? History of transfusion    ??? Hypertension    ??? Nephrotic syndrome    ??? Renal transplant disorder        PAST SURGICAL HISTORY:   Past Surgical History:   Procedure Laterality Date   ??? ABDOMINAL SURGERY     ??? AV FISTULA PLACEMENT Left 2004    left arm   ??? FRACTURE SURGERY     ??? NEPHRECTOMY TRANSPLANTED ORGAN  2007/11/15    Deceased donor renal transplant 11-15-07, episode of cellular acute rejection 11/2009     ??? PR REMOVAL OF RECIPIENT KIDNEY Right 03/20/2014    Procedure: RECIPIENT NEPHRECTOMY (SEPARATE PROCEDURE);  Surgeon: Vivi Barrack, MD;  Location: MAIN OR [facility];  Service: Transplant   ??? PR TRANSPLANT,PREP RENAL GRAFT/ARTERIAL N/A 02/20/2019    Procedure: [facility] RECONSTRUCTION CADAVER/LIVING DONOR RENAL ALLOGRAFT PRIOR TO TRANSPLANT; ARTERIAL ANASTOMOSIS EAC;  Surgeon: Leona Carry, MD;  Location: MAIN OR [facility];  Service: Transplant   ??? PR TRANSPLANTATION OF KIDNEY Left 02/20/2019    Procedure: RENAL ALLOTRANSPLANTATION, IMPLANTATION OF GRAFT; WITHOUT RECIPIENT NEPHRECTOMY;  Surgeon: Leona Carry, MD;  Location: MAIN OR [facility];  Service: Transplant       MEDICATIONS:   Current Outpatient Medications   Medication Sig Dispense Refill   ??? acetaminophen (TYLENOL) 500 MG tablet Take 1-2 tablets (500-1,000 mg total) by mouth every six (6) hours as needed for pain or fever (> 38C). 100 tablet 11 ??? amLODIPine (NORVASC) 5 MG tablet Take 2 tablets (10 mg total) by mouth daily. 60 tablet 11   ??? aspirin (ECOTRIN) 81 MG tablet HOLD until directed to start in clinic. 30 tablet 11   ??? calcium acetate,phosphat bind, (PHOSLO) 667 mg capsule Take 2 capsules (1,334 mg total) by mouth Three (3) times a day with a meal. 180 capsule 11   ??? ergocalciferol (DRISDOL) 1,250 mcg (50,000 unit) capsule Take 1 capsule (50,000 Units total) by mouth once a week. 8 capsule 0   ??? famotidine (PEPCID) 20 MG tablet Take 1 tablet (20 mg total) by mouth daily. 30 tablet 5   ??? gabapentin (NEURONTIN) 100 MG capsule Take 1 capsule (100 mg total) by mouth nightly for 12 days. 12 capsule 0   ??? hydrOXYzine (ATARAX) 25 MG tablet Take 1 tablet (25 mg total) by mouth every eight (8) hours as needed for itching. 30 tablet 11   ??? loperamide (IMODIUM) 2 mg capsule Take 4 mg (2 tablets) at first onset of diarrhea then 2 mg (1 tablet) with additional diarrhea episodes as needed. Maximum 4 tablet per day. 60 capsule 11   ??? magnesium oxide-Mg AA chelate (MAGNESIUM, AMINO ACID CHELATE,) 133 mg Tab Take 1 tablet by mouth Two (2) times a day. HOLD until directed to start by your coordinator. 100  tablet 11   ??? mycophenolate (MYFORTIC) 180 MG EC tablet Take 2 tablets (360 mg total) by mouth Two (2) times a day. 120 tablet 11   ??? sodium bicarbonate 650 mg tablet Take 1 tablet (650 mg total) by mouth Two (2) times a day. 100 tablet 2   ??? sulfamethoxazole-trimethoprim (BACTRIM) 400-80 mg per tablet Take 1 tablet (80 mg of trimethoprim total) by mouth Every Monday, Wednesday, and Friday. 12 tablet 5   ??? tacrolimus (ENVARSUS XR) 1 mg Tb24 extended release tablet Take 1 (1mg ) tablet with 5 (4mg ) tablets by mouth daily. Total daily dose = 21mg . 30 tablet 11   ??? tacrolimus (ENVARSUS XR) 4 mg Tb24 extended release tablet Take 5 (4mg ) tablets with 1 (1mg ) tablet by mouth daily. Total daily dose = 21mg . 150 tablet 11 ??? valGANciclovir (VALCYTE) 450 mg tablet Take 1 tablet (450 mg total) by mouth Every Monday and Thursday. 30 tablet 2   ??? zolpidem (AMBIEN) 10 mg tablet Take 10 mg by mouth nightly as needed for sleep.     ??? carvediloL (COREG) 6.25 MG tablet TAKE 1 TABLET (6.25 MG TOTAL) BY MOUTH TWO (2) TIMES A DAY. 180 tablet 3   ??? furosemide (LASIX) 40 MG tablet Take 0.5 tablets (20 mg total) by mouth daily for 7 days. 30 tablet 11   ??? sodium polystyrene sulfonate (SODIUM POLYSTYRENE) 15 gram/60 mL Susp Take 60 mL (15 g total) by mouth daily for 2 doses. 120 mL 0     No current facility-administered medications for this visit.        ALLERGIES:   Allergies   Allergen Reactions   ??? Tuberculin Ppd      Other reaction(s): Skin Rash       FAMILY HISTORY:   Family History   Problem Relation Age of Onset   ??? Alport syndrome Cousin    ??? Kidney disease Mother    ??? Kidney disease Maternal Grandmother    ??? Kidney disease Maternal Aunt        SOCIAL HISTORY:   Social History     Socioeconomic History   ??? Marital status: Single     Spouse name: None   ??? Number of children: None   ??? Years of education: None   ??? Highest education level: None   Occupational History   ??? None   Social Needs   ??? Financial resource strain: None   ??? Food insecurity     Worry: Never true     Inability: Never true   ??? Transportation needs     Medical: None     Non-medical: None   Tobacco Use   ??? Smoking status: Former Smoker     Packs/day: 0.25     Years: 0.50     Pack years: 0.12     Types: Cigarettes     Quit date: 02/24/2015     Years since quitting: 4.0   ??? Smokeless tobacco: Never Used   ??? Tobacco comment: Occasional historical cigars, previously quit cigarettes in 2014 (9 years history of pack/day)   Substance and Sexual Activity   ??? Alcohol use: No     Comment: Rare occasions   ??? Drug use: No   ??? Sexual activity: None   Lifestyle   ??? Physical activity     Days per week: None     Minutes per session: None   ??? Stress: None   Relationships   ??? Social connections Talks on phone: None  Gets together: None     Attends religious service: None     Active member of club or organization: None     Attends meetings of clubs or organizations: None     Relationship status: None   Other Topics Concern   ??? None   Social History Narrative   ??? None       REVIEW OF SYSTEMS:   10-system review of systems negative other than what is mentioned above.  The patient was asked to review all abnormal responses not pertinent to today's visit with their primary care physician.    PHYSICAL EXAM:   GENERAL: Pleasant male in no acute distress.   VITAL SIGNS: Blood pressure 131/64, pulse 85, temperature 36.9 ??C (98.4 ??F), temperature source Temporal, height 175.3 cm (5' 9), weight 60.3 kg (132 lb 15 oz).  HEENT: Normocephalic, atraumatic, extraocular muscles intact  NECK: Supple, no lymphadenopathy  CARDIOVASCULAR: No peripheral edema  PULMONARY: Normal work of breathing, no use of accessory muscles  ABDOMEN: Soft, non-tender, non-distended. No organomegaly or hernias.  BACK: No costovertebral angle tenderness, no spiny bone tenderness.   EXTREMITIES: No clubbing, cyanosis or edema.   NEUROLOGIC:  Cranial nerves II-XII grossly intact  PSYCHOLOGIC: Normal affect, normal mood  SKIN: Warm and dry. No lesions.  GU: nl phallus, test/epid bilaterally    LAB RESULTS:   Results for orders placed or performed in visit on 03/28/19   Tacrolimus Level, Trough   Result Value Ref Range    Tacrolimus, Trough 12.3 ng/mL     *Note: Due to a large number of results and/or encounters for the requested time period, some results have not been displayed. A complete set of results can be found in Results Review.     Ordered at this visit: No orders of the defined types were placed in this encounter.      No results found for: PSASCRN, PSADIAG    Lab Results   Component Value Date    WBC 5.3 03/30/2019    HGB 7.9 (L) 03/30/2019    HCT 24.1 (L) 03/30/2019    PLT 265 03/30/2019       Lab Results   Component Value Date NA 138 03/30/2019    K 6.0 (H) 03/30/2019    CL 114 (H) 03/30/2019    CO2 16.0 (L) 03/30/2019    BUN 33 (H) 03/30/2019    CREATININE 5.01 (H) 03/30/2019    GLU 102 (H) 03/30/2019    CALCIUM 11.2 (H) 03/30/2019    MG 1.7 03/30/2019    PHOS 3.0 03/30/2019       Lab Results   Component Value Date    BILITOT 0.5 03/18/2019    BILIDIR 0.50 (H) 03/22/2018    PROT 8.0 03/18/2019    ALBUMIN 4.5 03/29/2019    ALT 23 03/18/2019    AST 18 (L) 03/18/2019    ALKPHOS 94 03/18/2019    GGT 14 02/20/2019       Lab Results   Component Value Date    LABPROT 12.4 03/16/2014    INR 1.05 03/14/2019    APTT 31.9 03/14/2019                   Procedure: Cystourethroscopy, stent removal (16109)    Indication: s/p renal transplant, here for stent removal.    Antibiotic prophylaxis: Yes    Description:   Time out was performed immediately prior to the procedure.     The patient was prepped and draped in  the usual sterile fashion in the relaxed dorsal lithotomy position. Flexible cystoscopy was performed. The stents (2) were identified, grasped and removed without difficulty. The procedure was tolerated well. There were no complications.    Assessment:  Normal cysto/stent removal    Plan:    Follow-up with transplant as scheduled

## 2019-04-01 ENCOUNTER — Other Ambulatory Visit (HOSPITAL_COMMUNITY)
Admission: AD | Admit: 2019-04-01 | Discharge: 2019-04-01 | Disposition: A | Discharge status: Home / Self Care | Payer: Medicare Other | Source: Ambulatory Visit | Attending: Nephrology | Admitting: Nephrology

## 2019-04-01 DIAGNOSIS — Z09 Encounter for follow-up examination after completed treatment for conditions other than malignant neoplasm: Secondary | ICD-10-CM | POA: Insufficient documentation

## 2019-04-01 DIAGNOSIS — D631 Anemia in chronic kidney disease: Secondary | ICD-10-CM | POA: Diagnosis not present

## 2019-04-01 DIAGNOSIS — Z789 Other specified health status: Secondary | ICD-10-CM | POA: Diagnosis not present

## 2019-04-01 DIAGNOSIS — N189 Chronic kidney disease, unspecified: Secondary | ICD-10-CM | POA: Insufficient documentation

## 2019-04-01 DIAGNOSIS — D899 Disorder involving the immune mechanism, unspecified: Secondary | ICD-10-CM | POA: Insufficient documentation

## 2019-04-01 DIAGNOSIS — Z114 Encounter for screening for human immunodeficiency virus [HIV]: Secondary | ICD-10-CM | POA: Insufficient documentation

## 2019-04-01 DIAGNOSIS — Z79899 Other long term (current) drug therapy: Secondary | ICD-10-CM | POA: Insufficient documentation

## 2019-04-01 DIAGNOSIS — E1129 Type 2 diabetes mellitus with other diabetic kidney complication: Secondary | ICD-10-CM | POA: Insufficient documentation

## 2019-04-01 DIAGNOSIS — E559 Vitamin D deficiency, unspecified: Secondary | ICD-10-CM | POA: Insufficient documentation

## 2019-04-01 DIAGNOSIS — B259 Cytomegaloviral disease, unspecified: Secondary | ICD-10-CM | POA: Diagnosis not present

## 2019-04-01 DIAGNOSIS — T8610 Unspecified complication of kidney transplant: Secondary | ICD-10-CM | POA: Diagnosis not present

## 2019-04-01 DIAGNOSIS — N390 Urinary tract infection, site not specified: Secondary | ICD-10-CM | POA: Insufficient documentation

## 2019-04-01 DIAGNOSIS — Z940 Kidney transplant status: Secondary | ICD-10-CM | POA: Diagnosis not present

## 2019-04-01 DIAGNOSIS — Z9483 Pancreas transplant status: Secondary | ICD-10-CM | POA: Insufficient documentation

## 2019-04-01 LAB — BASIC METABOLIC PANEL
Anion gap: 7 (ref 5–15)
BUN: 33 mg/dL — ABNORMAL HIGH (ref 6–20)
CO2: 16 mmol/L — ABNORMAL LOW (ref 22–32)
Calcium: 10.9 mg/dL — ABNORMAL HIGH (ref 8.9–10.3)
Chloride: 116 mmol/L — ABNORMAL HIGH (ref 98–111)
Creatinine, Ser: 4.66 mg/dL — ABNORMAL HIGH (ref 0.61–1.24)
GFR calc Af Amer: 18 mL/min — ABNORMAL LOW (ref >60)
GFR calc non Af Amer: 15 mL/min — ABNORMAL LOW (ref >60)
Glucose, Bld: 109 mg/dL — ABNORMAL HIGH (ref 70–99)
Potassium: 6 mmol/L — ABNORMAL HIGH (ref 3.5–5.1)
Sodium: 139 mmol/L (ref 135–145)

## 2019-04-01 LAB — CBC WITH DIFFERENTIAL/PLATELET
Abs Immature Granulocytes: 0.03 10*3/uL (ref 0.00–0.07)
Basophils Absolute: 0 10*3/uL (ref 0.0–0.1)
Basophils Relative: 1 %
Eosinophils Absolute: 0.1 10*3/uL (ref 0.0–0.5)
Eosinophils Relative: 2 %
HCT: 24.2 % — ABNORMAL LOW (ref 39.0–52.0)
Hemoglobin: 7.8 g/dL — ABNORMAL LOW (ref 13.0–17.0)
Immature Granulocytes: 1 %
Lymphocytes Relative: 2 %
Lymphs Abs: 0.1 10*3/uL — ABNORMAL LOW (ref 0.7–4.0)
MCH: 26.4 pg (ref 26.0–34.0)
MCHC: 32.2 g/dL (ref 30.0–36.0)
MCV: 82 fL (ref 80.0–100.0)
Monocytes Absolute: 0.4 10*3/uL (ref 0.1–1.0)
Monocytes Relative: 13 %
Neutro Abs: 2.7 10*3/uL (ref 1.7–7.7)
Neutrophils Relative %: 81 %
Platelets: 211 10*3/uL (ref 150–400)
RBC: 2.95 MIL/uL — ABNORMAL LOW (ref 4.22–5.81)
RDW: 18.4 % — ABNORMAL HIGH (ref 11.5–15.5)
WBC: 3.3 10*3/uL — ABNORMAL LOW (ref 4.0–10.5)
nRBC: 0 % (ref 0.0–0.2)

## 2019-04-01 LAB — MAGNESIUM: Magnesium: 1.6 mg/dL — ABNORMAL LOW (ref 1.7–2.4)

## 2019-04-01 LAB — PHOSPHORUS: Phosphorus: 2.8 mg/dL (ref 2.5–4.6)

## 2019-04-03 LAB — TACROLIMUS LEVEL: Tacrolimus (FK506) - LabCorp: 8.3 ng/mL (ref 2.0–20.0)

## 2019-04-04 ENCOUNTER — Other Ambulatory Visit (HOSPITAL_COMMUNITY)
Admission: AD | Admit: 2019-04-04 | Discharge: 2019-04-04 | Disposition: A | Discharge status: Home / Self Care | Payer: Medicare Other | Source: Ambulatory Visit | Attending: Nephrology | Admitting: Nephrology

## 2019-04-04 DIAGNOSIS — Z940 Kidney transplant status: Secondary | ICD-10-CM | POA: Insufficient documentation

## 2019-04-04 DIAGNOSIS — Z9483 Pancreas transplant status: Secondary | ICD-10-CM | POA: Insufficient documentation

## 2019-04-04 DIAGNOSIS — D631 Anemia in chronic kidney disease: Secondary | ICD-10-CM | POA: Insufficient documentation

## 2019-04-04 DIAGNOSIS — B259 Cytomegaloviral disease, unspecified: Secondary | ICD-10-CM | POA: Diagnosis not present

## 2019-04-04 DIAGNOSIS — N390 Urinary tract infection, site not specified: Secondary | ICD-10-CM | POA: Insufficient documentation

## 2019-04-04 DIAGNOSIS — D899 Disorder involving the immune mechanism, unspecified: Secondary | ICD-10-CM | POA: Insufficient documentation

## 2019-04-04 DIAGNOSIS — E559 Vitamin D deficiency, unspecified: Secondary | ICD-10-CM | POA: Insufficient documentation

## 2019-04-04 DIAGNOSIS — Z79899 Other long term (current) drug therapy: Secondary | ICD-10-CM | POA: Insufficient documentation

## 2019-04-04 DIAGNOSIS — Z09 Encounter for follow-up examination after completed treatment for conditions other than malignant neoplasm: Secondary | ICD-10-CM | POA: Insufficient documentation

## 2019-04-04 DIAGNOSIS — Z789 Other specified health status: Secondary | ICD-10-CM | POA: Diagnosis not present

## 2019-04-04 DIAGNOSIS — Z114 Encounter for screening for human immunodeficiency virus [HIV]: Secondary | ICD-10-CM | POA: Insufficient documentation

## 2019-04-04 DIAGNOSIS — E1129 Type 2 diabetes mellitus with other diabetic kidney complication: Secondary | ICD-10-CM | POA: Insufficient documentation

## 2019-04-04 LAB — CBC W/ DIFFERENTIAL
BASOPHILS ABSOLUTE COUNT: 0 10*9/L
BASOPHILS RELATIVE PERCENT: 1 %
BASOPHILS RELATIVE PERCENT: 1 %
EOSINOPHILS ABSOLUTE COUNT: 0.1 10*9/L
EOSINOPHILS ABSOLUTE COUNT: 0.1 10*9/L
EOSINOPHILS RELATIVE PERCENT: 2 %
EOSINOPHILS RELATIVE PERCENT: 2 %
HEMATOCRIT: 24.2 % — ABNORMAL LOW
HEMATOCRIT: 25.2 % — ABNORMAL LOW
HEMOGLOBIN: 7.8 g/dL — ABNORMAL LOW
HEMOGLOBIN: 7.9 g/dL — ABNORMAL LOW
LYMPHOCYTES ABSOLUTE COUNT: 0.1 10*9/L — ABNORMAL LOW
LYMPHOCYTES ABSOLUTE COUNT: 0.1 10*9/L — ABNORMAL LOW
LYMPHOCYTES RELATIVE PERCENT: 2 %
LYMPHOCYTES RELATIVE PERCENT: 2 %
MEAN CORPUSCULAR HEMOGLOBIN CONC: 32.2 g/dL
MEAN CORPUSCULAR HEMOGLOBIN CONC: 31.3 g/dL
MEAN CORPUSCULAR HEMOGLOBIN: 26.4 pg
MEAN CORPUSCULAR VOLUME: 82 fL
MEAN CORPUSCULAR VOLUME: 82.9 fL
MONOCYTES ABSOLUTE COUNT: 0.4 10*9/L
MONOCYTES ABSOLUTE COUNT: 0.5 10*9/L
MONOCYTES RELATIVE PERCENT: 14 %
MONOCYTES RELATIVE PERCENT: 15 %
NEUTROPHILS ABSOLUTE COUNT: 2.5 10*9/L
NEUTROPHILS RELATIVE PERCENT: 81 %
NEUTROPHILS RELATIVE PERCENT: 80 %
PLATELET COUNT: 211 10*9/L
PLATELET COUNT: 222 10*9/L
RED BLOOD CELL COUNT: 2.95 10*12/L — ABNORMAL LOW
RED BLOOD CELL COUNT: 3.04 10*12/L — ABNORMAL LOW
RED CELL DISTRIBUTION WIDTH: 18.4 % — ABNORMAL HIGH
RED CELL DISTRIBUTION WIDTH: 17.9 % — ABNORMAL HIGH
WHITE BLOOD CELL COUNT: 3.3 10*9/L — ABNORMAL LOW
WHITE BLOOD CELL COUNT: 3.1 10*9/L — ABNORMAL LOW

## 2019-04-04 LAB — BASIC METABOLIC PANEL
Anion gap: 7 (ref 5–15)
BLOOD UREA NITROGEN: 33 mg/dL — ABNORMAL HIGH
BLOOD UREA NITROGEN: 33 mg/dL — ABNORMAL HIGH
BUN: 33 mg/dL — ABNORMAL HIGH (ref 6–20)
CALCIUM: 10.9 mg/dL — ABNORMAL HIGH
CALCIUM: 11.6 mg/dL — ABNORMAL HIGH
CHLORIDE: 116 mmol/L — ABNORMAL HIGH
CHLORIDE: 113 mmol/L — ABNORMAL HIGH
CO2: 16 mmol/L — ABNORMAL LOW
CO2: 19 mmol/L — ABNORMAL LOW
CO2: 19 mmol/L — ABNORMAL LOW (ref 22–32)
CREATININE: 4.66 mg/dL — ABNORMAL HIGH
CREATININE: 4.34 mg/dL — ABNORMAL HIGH
Calcium: 11.6 mg/dL — ABNORMAL HIGH (ref 8.9–10.3)
Chloride: 113 mmol/L — ABNORMAL HIGH (ref 98–111)
Creatinine, Ser: 4.34 mg/dL — ABNORMAL HIGH (ref 0.61–1.24)
EGFR CKD-EPI AA MALE: 18 mL/min/{1.73_m2} — ABNORMAL LOW
EGFR CKD-EPI AA MALE: 19 mL/min/{1.73_m2} — ABNORMAL LOW
GFR calc Af Amer: 19 mL/min — ABNORMAL LOW (ref >60)
GFR calc non Af Amer: 17 mL/min — ABNORMAL LOW (ref >60)
Glucose, Bld: 103 mg/dL — ABNORMAL HIGH (ref 70–99)
POTASSIUM: 6 mmol/L — ABNORMAL HIGH
POTASSIUM: 6.3 mmol/L — ABNORMAL HIGH
Potassium: 6.3 mmol/L (ref 3.5–5.1)
SODIUM: 139 mmol/L
Sodium: 139 mmol/L (ref 135–145)

## 2019-04-04 LAB — MAGNESIUM
Lab: 1.6 — ABNORMAL LOW
Lab: 1.6 — ABNORMAL LOW
MAGNESIUM: 1.6 mg/dL — ABNORMAL LOW
Magnesium: 1.6 mg/dL — ABNORMAL LOW (ref 1.7–2.4)

## 2019-04-04 LAB — TACROLIMUS, TROUGH
Lab: 7.8
Lab: 8.3

## 2019-04-04 LAB — PHOSPHORUS
Lab: 2.5
Lab: 2.8
Phosphorus: 2.5 mg/dL (ref 2.5–4.6)

## 2019-04-04 LAB — POTASSIUM: Lab: 6 — ABNORMAL HIGH

## 2019-04-04 LAB — SODIUM: Lab: 139

## 2019-04-04 LAB — BASOPHILS ABSOLUTE COUNT: Lab: 0

## 2019-04-04 LAB — MEAN CORPUSCULAR HEMOGLOBIN CONC: Lab: 31.3

## 2019-04-04 LAB — CBC WITH DIFFERENTIAL/PLATELET
Abs Immature Granulocytes: 0.01 10*3/uL (ref 0.00–0.07)
Basophils Absolute: 0 10*3/uL (ref 0.0–0.1)
Basophils Relative: 1 %
Eosinophils Absolute: 0.1 10*3/uL (ref 0.0–0.5)
Eosinophils Relative: 2 %
HCT: 25.2 % — ABNORMAL LOW (ref 39.0–52.0)
Hemoglobin: 7.9 g/dL — ABNORMAL LOW (ref 13.0–17.0)
Immature Granulocytes: 0 %
Lymphocytes Relative: 2 %
Lymphs Abs: 0.1 10*3/uL — ABNORMAL LOW (ref 0.7–4.0)
MCH: 26 pg (ref 26.0–34.0)
MCHC: 31.3 g/dL (ref 30.0–36.0)
MCV: 82.9 fL (ref 80.0–100.0)
Monocytes Absolute: 0.5 10*3/uL (ref 0.1–1.0)
Monocytes Relative: 15 %
Neutro Abs: 2.5 10*3/uL (ref 1.7–7.7)
Neutrophils Relative %: 80 %
Platelets: 222 10*3/uL (ref 150–400)
RBC: 3.04 MIL/uL — ABNORMAL LOW (ref 4.22–5.81)
RDW: 17.9 % — ABNORMAL HIGH (ref 11.5–15.5)
WBC: 3.1 10*3/uL — ABNORMAL LOW (ref 4.0–10.5)
nRBC: 0 % (ref 0.0–0.2)

## 2019-04-04 MED ORDER — ERGOCALCIFEROL (VITAMIN D2) 1,250 MCG (50,000 UNIT) CAPSULE: capsule | 0 refills | 0 days | Status: AC

## 2019-04-04 MED ORDER — SPS (WITH SORBITOL) 15 GRAM-20 GRAM/60 ML ORAL SUSPENSION: 30 g | mL | Freq: Every day | 1 refills | 0 days | Status: AC

## 2019-04-04 MED ORDER — SODIUM POLYSTYRENE SULFONATE 15 GRAM/60 ML ORAL SUSPENSION: 30 g | mL | Freq: Every day | 1 refills | 2 days | Status: AC

## 2019-04-04 MED FILL — SPS (WITH SORBITOL) 15 GRAM-20 GRAM/60 ML ORAL SUSPENSION: 2 days supply | Qty: 240 | Fill #0 | Status: AC

## 2019-04-04 MED FILL — SPS (WITH SORBITOL) 15 GRAM-20 GRAM/60 ML ORAL SUSPENSION: ORAL | 2 days supply | Qty: 240 | Fill #0

## 2019-04-04 NOTE — Unmapped (Signed)
Pt advised to stop bactrim and take kayexalate 30 gm daily for two days

## 2019-04-04 NOTE — Unmapped (Signed)
Medication refill has been requested by the patinet

## 2019-04-04 NOTE — Unmapped (Signed)
Pt advised to stop bactrim and take Kayexalate 30mg  daily for 2 days.

## 2019-04-06 ENCOUNTER — Other Ambulatory Visit (HOSPITAL_COMMUNITY)
Admission: RE | Admit: 2019-04-06 | Discharge: 2019-04-06 | Disposition: A | Discharge status: Home / Self Care | Payer: Medicare Other | Source: Ambulatory Visit | Attending: Nephrology | Admitting: Nephrology

## 2019-04-06 DIAGNOSIS — B259 Cytomegaloviral disease, unspecified: Secondary | ICD-10-CM | POA: Diagnosis not present

## 2019-04-06 DIAGNOSIS — N390 Urinary tract infection, site not specified: Secondary | ICD-10-CM | POA: Diagnosis not present

## 2019-04-06 DIAGNOSIS — Z940 Kidney transplant status: Secondary | ICD-10-CM | POA: Insufficient documentation

## 2019-04-06 DIAGNOSIS — Z79899 Other long term (current) drug therapy: Secondary | ICD-10-CM | POA: Insufficient documentation

## 2019-04-06 DIAGNOSIS — E1122 Type 2 diabetes mellitus with diabetic chronic kidney disease: Secondary | ICD-10-CM | POA: Diagnosis not present

## 2019-04-06 DIAGNOSIS — Z9483 Pancreas transplant status: Secondary | ICD-10-CM | POA: Diagnosis not present

## 2019-04-06 DIAGNOSIS — N189 Chronic kidney disease, unspecified: Secondary | ICD-10-CM | POA: Insufficient documentation

## 2019-04-06 DIAGNOSIS — D899 Disorder involving the immune mechanism, unspecified: Secondary | ICD-10-CM | POA: Diagnosis not present

## 2019-04-06 DIAGNOSIS — E559 Vitamin D deficiency, unspecified: Secondary | ICD-10-CM | POA: Diagnosis not present

## 2019-04-06 DIAGNOSIS — Z789 Other specified health status: Secondary | ICD-10-CM | POA: Insufficient documentation

## 2019-04-06 DIAGNOSIS — E1129 Type 2 diabetes mellitus with other diabetic kidney complication: Secondary | ICD-10-CM | POA: Insufficient documentation

## 2019-04-06 DIAGNOSIS — Z114 Encounter for screening for human immunodeficiency virus [HIV]: Secondary | ICD-10-CM | POA: Insufficient documentation

## 2019-04-06 DIAGNOSIS — D631 Anemia in chronic kidney disease: Secondary | ICD-10-CM | POA: Insufficient documentation

## 2019-04-06 LAB — BASIC METABOLIC PANEL
Anion gap: 7 (ref 5–15)
BLOOD UREA NITROGEN: 29 mg/dL — ABNORMAL HIGH
BUN: 29 mg/dL — ABNORMAL HIGH (ref 6–20)
CALCIUM: 11.3 mg/dL — ABNORMAL HIGH
CO2: 18 mmol/L — ABNORMAL LOW (ref 22–32)
CO2: 18 mmol/L — ABNORMAL LOW
CREATININE: 3.73 mg/dL — ABNORMAL HIGH
Calcium: 11.3 mg/dL — ABNORMAL HIGH (ref 8.9–10.3)
Chloride: 113 mmol/L — ABNORMAL HIGH (ref 98–111)
Creatinine, Ser: 3.73 mg/dL — ABNORMAL HIGH (ref 0.61–1.24)
EGFR CKD-EPI AA MALE: 23 mL/min/{1.73_m2} — ABNORMAL LOW
GFR calc Af Amer: 23 mL/min — ABNORMAL LOW (ref >60)
GFR calc non Af Amer: 20 mL/min — ABNORMAL LOW (ref >60)
GLUCOSE RANDOM: 111 mg/dL — ABNORMAL HIGH
Glucose, Bld: 111 mg/dL — ABNORMAL HIGH (ref 70–99)
Potassium: 6 mmol/L — ABNORMAL HIGH (ref 3.5–5.1)
SODIUM: 138 mmol/L
Sodium: 138 mmol/L (ref 135–145)

## 2019-04-06 LAB — CBC W/ DIFFERENTIAL
BASOPHILS ABSOLUTE COUNT: 0 10*9/L
BASOPHILS RELATIVE PERCENT: 1 %
EOSINOPHILS ABSOLUTE COUNT: 0.1 10*9/L
EOSINOPHILS RELATIVE PERCENT: 2 %
HEMOGLOBIN: 7.9 g/dL — ABNORMAL LOW
LYMPHOCYTES ABSOLUTE COUNT: 0.1 10*9/L — ABNORMAL LOW
LYMPHOCYTES RELATIVE PERCENT: 2 %
MEAN CORPUSCULAR HEMOGLOBIN CONC: 32.4 g/dL
MEAN CORPUSCULAR HEMOGLOBIN: 26.6 pg
MEAN CORPUSCULAR VOLUME: 82.2 fL
MONOCYTES RELATIVE PERCENT: 14 %
NEUTROPHILS ABSOLUTE COUNT: 2.3 10*9/L
NEUTROPHILS RELATIVE PERCENT: 81 %
PLATELET COUNT: 239 10*9/L
RED BLOOD CELL COUNT: 2.97 10*12/L — ABNORMAL LOW
RED CELL DISTRIBUTION WIDTH: 17.8 % — ABNORMAL HIGH
WHITE BLOOD CELL COUNT: 2.9 10*9/L — ABNORMAL LOW

## 2019-04-06 LAB — EGFR CKD-EPI AA FEMALE: Lab: 0

## 2019-04-06 LAB — PHOSPHORUS
Lab: 2.4 — ABNORMAL LOW
Phosphorus: 2.4 mg/dL — ABNORMAL LOW (ref 2.5–4.6)

## 2019-04-06 LAB — LYMPHOCYTES RELATIVE PERCENT: Lab: 2

## 2019-04-06 LAB — MAGNESIUM
Lab: 1.5 — ABNORMAL LOW
Magnesium: 1.5 mg/dL — ABNORMAL LOW (ref 1.7–2.4)

## 2019-04-06 LAB — TACROLIMUS, TROUGH: Lab: 12.7

## 2019-04-06 LAB — CBC WITH DIFFERENTIAL/PLATELET
Abs Immature Granulocytes: 0.01 10*3/uL (ref 0.00–0.07)
Basophils Absolute: 0 10*3/uL (ref 0.0–0.1)
Basophils Relative: 1 %
Eosinophils Absolute: 0.1 10*3/uL (ref 0.0–0.5)
Eosinophils Relative: 2 %
HCT: 24.4 % — ABNORMAL LOW (ref 39.0–52.0)
Hemoglobin: 7.9 g/dL — ABNORMAL LOW (ref 13.0–17.0)
Immature Granulocytes: 0 %
Lymphocytes Relative: 2 %
Lymphs Abs: 0.1 10*3/uL — ABNORMAL LOW (ref 0.7–4.0)
MCH: 26.6 pg (ref 26.0–34.0)
MCHC: 32.4 g/dL (ref 30.0–36.0)
MCV: 82.2 fL (ref 80.0–100.0)
Monocytes Absolute: 0.4 10*3/uL (ref 0.1–1.0)
Monocytes Relative: 14 %
Neutro Abs: 2.3 10*3/uL (ref 1.7–7.7)
Neutrophils Relative %: 81 %
Platelets: 239 10*3/uL (ref 150–400)
RBC: 2.97 MIL/uL — ABNORMAL LOW (ref 4.22–5.81)
RDW: 17.8 % — ABNORMAL HIGH (ref 11.5–15.5)
WBC: 2.9 10*3/uL — ABNORMAL LOW (ref 4.0–10.5)
nRBC: 0 % (ref 0.0–0.2)

## 2019-04-06 LAB — TACROLIMUS LEVEL: Tacrolimus (FK506) - LabCorp: 12.7 ng/mL (ref 2.0–20.0)

## 2019-04-06 MED ORDER — SODIUM BICARBONATE 650 MG TABLET: 1300 mg | tablet | Freq: Two times a day (BID) | 11 refills | 30 days | Status: AC

## 2019-04-06 MED ORDER — LOKELMA 5 GRAM ORAL POWDER PACKET: 5 g | each | 5 refills | 30 days | Status: AC

## 2019-04-06 MED FILL — SPS (WITH SORBITOL) 15 GRAM-20 GRAM/60 ML ORAL SUSPENSION: ORAL | 2 days supply | Qty: 240 | Fill #1

## 2019-04-06 MED FILL — SPS (WITH SORBITOL) 15 GRAM-20 GRAM/60 ML ORAL SUSPENSION: 2 days supply | Qty: 240 | Fill #1 | Status: AC

## 2019-04-06 NOTE — Unmapped (Signed)
Advised to increase sodium bicarb to 1300 mg bid and start kayexalate 30 gm daily for 2 day, potassium 6.0

## 2019-04-07 MED FILL — LOKELMA 5 GRAM ORAL POWDER PACKET: ORAL | 60 days supply | Qty: 30 | Fill #0

## 2019-04-07 MED FILL — SODIUM BICARBONATE 650 MG TABLET: 30 days supply | Qty: 120 | Fill #0 | Status: AC

## 2019-04-07 MED FILL — LOKELMA 5 GRAM ORAL POWDER PACKET: 60 days supply | Qty: 30 | Fill #0 | Status: AC

## 2019-04-07 MED FILL — SODIUM BICARBONATE 650 MG TABLET: ORAL | 30 days supply | Qty: 120 | Fill #0

## 2019-04-08 ENCOUNTER — Ambulatory Visit: Admit: 2019-04-08 | Discharge: 2019-04-08 | Payer: MEDICARE

## 2019-04-08 ENCOUNTER — Institutional Professional Consult (permissible substitution): Admit: 2019-04-08 | Discharge: 2019-04-08 | Payer: MEDICARE

## 2019-04-08 DIAGNOSIS — Z992 Dependence on renal dialysis: Principal | ICD-10-CM

## 2019-04-08 DIAGNOSIS — D631 Anemia in chronic kidney disease: Principal | ICD-10-CM

## 2019-04-08 DIAGNOSIS — N186 End stage renal disease: Principal | ICD-10-CM

## 2019-04-08 DIAGNOSIS — N189 Chronic kidney disease, unspecified: Secondary | ICD-10-CM | POA: Diagnosis not present

## 2019-04-08 LAB — CBC W/ AUTO DIFF
BASOPHILS ABSOLUTE COUNT: 0 10*9/L (ref 0.0–0.1)
BASOPHILS RELATIVE PERCENT: 0.7 %
EOSINOPHILS ABSOLUTE COUNT: 0.1 10*9/L (ref 0.0–0.4)
HEMOGLOBIN: 8.1 g/dL — ABNORMAL LOW (ref 13.5–17.5)
LARGE UNSTAINED CELLS: 2 % (ref 0–4)
LYMPHOCYTES ABSOLUTE COUNT: 0.1 10*9/L — ABNORMAL LOW (ref 1.5–5.0)
LYMPHOCYTES RELATIVE PERCENT: 2.4 %
MEAN CORPUSCULAR HEMOGLOBIN CONC: 31.3 g/dL (ref 31.0–37.0)
MEAN CORPUSCULAR HEMOGLOBIN: 26.4 pg (ref 26.0–34.0)
MEAN CORPUSCULAR VOLUME: 84.5 fL (ref 80.0–100.0)
MEAN PLATELET VOLUME: 10 fL (ref 7.0–10.0)
MONOCYTES ABSOLUTE COUNT: 0.2 10*9/L (ref 0.2–0.8)
MONOCYTES RELATIVE PERCENT: 5.2 %
NEUTROPHILS ABSOLUTE COUNT: 2.6 10*9/L (ref 2.0–7.5)
NEUTROPHILS RELATIVE PERCENT: 87.5 %
PLATELET COUNT: 226 10*9/L (ref 150–440)
RED BLOOD CELL COUNT: 3.06 10*12/L — ABNORMAL LOW (ref 4.50–5.90)
RED CELL DISTRIBUTION WIDTH: 20.3 % — ABNORMAL HIGH (ref 12.0–15.0)
WBC ADJUSTED: 3 10*9/L — ABNORMAL LOW (ref 4.5–11.0)

## 2019-04-08 LAB — RENAL FUNCTION PANEL
ALBUMIN: 4.6 g/dL (ref 3.5–5.0)
ANION GAP: 8 mmol/L (ref 7–15)
BLOOD UREA NITROGEN: 24 mg/dL — ABNORMAL HIGH (ref 7–21)
BUN / CREAT RATIO: 7
CALCIUM: 11.2 mg/dL — ABNORMAL HIGH (ref 8.5–10.2)
CHLORIDE: 110 mmol/L — ABNORMAL HIGH (ref 98–107)
CO2: 22 mmol/L (ref 22.0–30.0)
CREATININE: 3.61 mg/dL — ABNORMAL HIGH (ref 0.70–1.30)
EGFR CKD-EPI AA MALE: 24 mL/min/{1.73_m2} — ABNORMAL LOW (ref >=60)
EGFR CKD-EPI NON-AA MALE: 21 mL/min/{1.73_m2} — ABNORMAL LOW (ref >=60)
GLUCOSE RANDOM: 115 mg/dL — ABNORMAL HIGH (ref 70–99)
PHOSPHORUS: 2.8 mg/dL — ABNORMAL LOW (ref 2.9–4.7)
POTASSIUM: 4.8 mmol/L (ref 3.5–5.0)
SODIUM: 140 mmol/L (ref 135–145)

## 2019-04-08 LAB — PHOSPHORUS: Phosphate:MCnc:Pt:Ser/Plas:Qn:: 2.8 — ABNORMAL LOW

## 2019-04-08 LAB — MAGNESIUM: Magnesium:MCnc:Pt:Ser/Plas:Qn:: 1.3 — ABNORMAL LOW

## 2019-04-08 LAB — SMEAR REVIEW: Lab: 0

## 2019-04-08 LAB — TACROLIMUS, TROUGH: Lab: 8.6

## 2019-04-08 LAB — TACROLIMUS BLOOD: Lab: 6.7

## 2019-04-08 LAB — LYMPHOCYTES ABSOLUTE COUNT: Lymphocytes:NCnc:Pt:Bld:Qn:Automated count: 0.1 — ABNORMAL LOW

## 2019-04-08 LAB — TACROLIMUS LEVEL: Tacrolimus (FK506) - LabCorp: 8.2 ng/mL (ref 2.0–20.0)

## 2019-04-08 NOTE — Unmapped (Signed)
Labs drawn on 04/06/2019, Hgb was 7.9. Per provider, pt to receive  dose of Aranesp 200 mcg. Administered Aranesp 200 mcg SQ in right arm, pt tolerated well.

## 2019-04-09 LAB — CMV DNA, QUANTITATIVE, PCR

## 2019-04-09 LAB — CMV COMMENT: Lab: 0

## 2019-04-11 ENCOUNTER — Other Ambulatory Visit (HOSPITAL_COMMUNITY)
Admission: RE | Admit: 2019-04-11 | Discharge: 2019-04-11 | Disposition: A | Discharge status: Home / Self Care | Payer: Medicare Other | Source: Ambulatory Visit | Attending: Nephrology | Admitting: Nephrology

## 2019-04-11 DIAGNOSIS — E559 Vitamin D deficiency, unspecified: Secondary | ICD-10-CM | POA: Insufficient documentation

## 2019-04-11 DIAGNOSIS — E1129 Type 2 diabetes mellitus with other diabetic kidney complication: Secondary | ICD-10-CM | POA: Diagnosis not present

## 2019-04-11 DIAGNOSIS — Z789 Other specified health status: Secondary | ICD-10-CM | POA: Insufficient documentation

## 2019-04-11 DIAGNOSIS — D899 Disorder involving the immune mechanism, unspecified: Secondary | ICD-10-CM | POA: Diagnosis not present

## 2019-04-11 DIAGNOSIS — Z9483 Pancreas transplant status: Secondary | ICD-10-CM | POA: Diagnosis not present

## 2019-04-11 DIAGNOSIS — T8610 Unspecified complication of kidney transplant: Secondary | ICD-10-CM | POA: Diagnosis not present

## 2019-04-11 DIAGNOSIS — B259 Cytomegaloviral disease, unspecified: Secondary | ICD-10-CM | POA: Insufficient documentation

## 2019-04-11 DIAGNOSIS — Z79899 Other long term (current) drug therapy: Secondary | ICD-10-CM | POA: Insufficient documentation

## 2019-04-11 DIAGNOSIS — N390 Urinary tract infection, site not specified: Secondary | ICD-10-CM | POA: Diagnosis not present

## 2019-04-11 DIAGNOSIS — Z09 Encounter for follow-up examination after completed treatment for conditions other than malignant neoplasm: Secondary | ICD-10-CM | POA: Diagnosis not present

## 2019-04-11 DIAGNOSIS — Z114 Encounter for screening for human immunodeficiency virus [HIV]: Secondary | ICD-10-CM | POA: Diagnosis not present

## 2019-04-11 DIAGNOSIS — D631 Anemia in chronic kidney disease: Secondary | ICD-10-CM | POA: Diagnosis not present

## 2019-04-11 DIAGNOSIS — N189 Chronic kidney disease, unspecified: Secondary | ICD-10-CM | POA: Diagnosis not present

## 2019-04-11 DIAGNOSIS — Z940 Kidney transplant status: Secondary | ICD-10-CM | POA: Diagnosis not present

## 2019-04-11 LAB — BASIC METABOLIC PANEL
Anion gap: 7 (ref 5–15)
BLOOD UREA NITROGEN: 17 mg/dL
BUN: 17 mg/dL (ref 6–20)
CALCIUM: 10.9 mg/dL — ABNORMAL HIGH
CHLORIDE: 112 mmol/L — ABNORMAL HIGH
CO2: 21 mmol/L — ABNORMAL LOW (ref 22–32)
CO2: 21 mmol/L — ABNORMAL LOW
CREATININE: 3.38 mg/dL — ABNORMAL HIGH
Calcium: 10.9 mg/dL — ABNORMAL HIGH (ref 8.9–10.3)
Chloride: 112 mmol/L — ABNORMAL HIGH (ref 98–111)
Creatinine, Ser: 3.38 mg/dL — ABNORMAL HIGH (ref 0.61–1.24)
GFR calc Af Amer: 26 mL/min — ABNORMAL LOW (ref >60)
GFR calc non Af Amer: 23 mL/min — ABNORMAL LOW (ref >60)
Glucose, Bld: 107 mg/dL — ABNORMAL HIGH (ref 70–99)
POTASSIUM: 4.7 mmol/L
Potassium: 4.7 mmol/L (ref 3.5–5.1)
SODIUM: 140 mmol/L
Sodium: 140 mmol/L (ref 135–145)

## 2019-04-11 LAB — CBC W/ DIFFERENTIAL
BASOPHILS ABSOLUTE COUNT: 0 10*9/L
EOSINOPHILS RELATIVE PERCENT: 2 %
HEMATOCRIT: 25.1 % — ABNORMAL LOW
HEMOGLOBIN: 7.9 g/dL — ABNORMAL LOW
LYMPHOCYTES ABSOLUTE COUNT: 0.1 10*9/L — ABNORMAL LOW
LYMPHOCYTES RELATIVE PERCENT: 2 %
MEAN CORPUSCULAR HEMOGLOBIN CONC: 31.5 g/dL
MEAN CORPUSCULAR HEMOGLOBIN: 26 pg
MEAN CORPUSCULAR VOLUME: 82.6 fL
MONOCYTES RELATIVE PERCENT: 11 %
NEUTROPHILS ABSOLUTE COUNT: 2.8 10*9/L
NEUTROPHILS RELATIVE PERCENT: 82 %
PLATELET COUNT: 208 10*9/L
RED BLOOD CELL COUNT: 3.04 10*12/L — ABNORMAL LOW
RED CELL DISTRIBUTION WIDTH: 18.8 % — ABNORMAL HIGH
WBC ADJUSTED: 3.4 10*9/L — ABNORMAL LOW

## 2019-04-11 LAB — MAGNESIUM
Lab: 1.4 — ABNORMAL LOW
Magnesium: 1.4 mg/dL — ABNORMAL LOW (ref 1.7–2.4)

## 2019-04-11 LAB — PHOSPHORUS
Lab: 2.4 — ABNORMAL LOW
Phosphorus: 2.4 mg/dL — ABNORMAL LOW (ref 2.5–4.6)

## 2019-04-11 LAB — CHLORIDE: Lab: 112 — ABNORMAL HIGH

## 2019-04-11 LAB — BASOPHILS ABSOLUTE COUNT: Lab: 0

## 2019-04-11 LAB — CBC WITH DIFFERENTIAL/PLATELET
Abs Immature Granulocytes: 0.07 10*3/uL (ref 0.00–0.07)
Basophils Absolute: 0 10*3/uL (ref 0.0–0.1)
Basophils Relative: 1 %
Eosinophils Absolute: 0.1 10*3/uL (ref 0.0–0.5)
Eosinophils Relative: 2 %
HCT: 25.1 % — ABNORMAL LOW (ref 39.0–52.0)
Hemoglobin: 7.9 g/dL — ABNORMAL LOW (ref 13.0–17.0)
Immature Granulocytes: 2 %
Lymphocytes Relative: 2 %
Lymphs Abs: 0.1 10*3/uL — ABNORMAL LOW (ref 0.7–4.0)
MCH: 26 pg (ref 26.0–34.0)
MCHC: 31.5 g/dL (ref 30.0–36.0)
MCV: 82.6 fL (ref 80.0–100.0)
Monocytes Absolute: 0.4 10*3/uL (ref 0.1–1.0)
Monocytes Relative: 11 %
Neutro Abs: 2.8 10*3/uL (ref 1.7–7.7)
Neutrophils Relative %: 82 %
Platelets: 208 10*3/uL (ref 150–400)
RBC: 3.04 MIL/uL — ABNORMAL LOW (ref 4.22–5.81)
RDW: 18.8 % — ABNORMAL HIGH (ref 11.5–15.5)
WBC: 3.4 10*3/uL — ABNORMAL LOW (ref 4.0–10.5)
nRBC: 0 % (ref 0.0–0.2)

## 2019-04-13 ENCOUNTER — Other Ambulatory Visit (HOSPITAL_COMMUNITY)
Admission: AD | Admit: 2019-04-13 | Discharge: 2019-04-13 | Disposition: A | Discharge status: Home / Self Care | Payer: Medicare Other | Source: Ambulatory Visit | Attending: Nephrology | Admitting: Nephrology

## 2019-04-13 DIAGNOSIS — Z789 Other specified health status: Secondary | ICD-10-CM | POA: Diagnosis not present

## 2019-04-13 DIAGNOSIS — E1129 Type 2 diabetes mellitus with other diabetic kidney complication: Secondary | ICD-10-CM | POA: Diagnosis not present

## 2019-04-13 DIAGNOSIS — Z79899 Other long term (current) drug therapy: Secondary | ICD-10-CM | POA: Insufficient documentation

## 2019-04-13 DIAGNOSIS — B259 Cytomegaloviral disease, unspecified: Secondary | ICD-10-CM | POA: Diagnosis not present

## 2019-04-13 DIAGNOSIS — N390 Urinary tract infection, site not specified: Secondary | ICD-10-CM | POA: Insufficient documentation

## 2019-04-13 DIAGNOSIS — Z9483 Pancreas transplant status: Secondary | ICD-10-CM | POA: Diagnosis not present

## 2019-04-13 DIAGNOSIS — Z940 Kidney transplant status: Secondary | ICD-10-CM | POA: Insufficient documentation

## 2019-04-13 DIAGNOSIS — D631 Anemia in chronic kidney disease: Secondary | ICD-10-CM | POA: Insufficient documentation

## 2019-04-13 DIAGNOSIS — Z114 Encounter for screening for human immunodeficiency virus [HIV]: Secondary | ICD-10-CM | POA: Diagnosis not present

## 2019-04-13 DIAGNOSIS — E559 Vitamin D deficiency, unspecified: Secondary | ICD-10-CM | POA: Diagnosis not present

## 2019-04-13 DIAGNOSIS — Z09 Encounter for follow-up examination after completed treatment for conditions other than malignant neoplasm: Secondary | ICD-10-CM | POA: Insufficient documentation

## 2019-04-13 LAB — CBC W/ DIFFERENTIAL
BASOPHILS ABSOLUTE COUNT: 0 10*9/L
BASOPHILS RELATIVE PERCENT: 1 %
EOSINOPHILS ABSOLUTE COUNT: 0.1 10*9/L
EOSINOPHILS RELATIVE PERCENT: 1 %
HEMATOCRIT: 26.9 % — ABNORMAL LOW
HEMOGLOBIN: 8.3 g/dL — ABNORMAL LOW
LYMPHOCYTES ABSOLUTE COUNT: 0.1 10*9/L — ABNORMAL LOW
LYMPHOCYTES RELATIVE PERCENT: 2 %
MEAN CORPUSCULAR HEMOGLOBIN CONC: 30.9 g/dL
MEAN CORPUSCULAR HEMOGLOBIN: 25.9 pg — ABNORMAL LOW
MEAN CORPUSCULAR VOLUME: 84.1 fL
MONOCYTES ABSOLUTE COUNT: 0.5 10*9/L
MONOCYTES RELATIVE PERCENT: 12 %
NEUTROPHILS ABSOLUTE COUNT: 3.3 10*9/L
NEUTROPHILS RELATIVE PERCENT: 83 %
PLATELET COUNT: 173 10*9/L
RED BLOOD CELL COUNT: 3.2 10*12/L — ABNORMAL LOW
RED CELL DISTRIBUTION WIDTH: 18.8 % — ABNORMAL HIGH

## 2019-04-13 LAB — BASIC METABOLIC PANEL
Anion gap: 5 (ref 5–15)
BLOOD UREA NITROGEN: 25 mg/dL — ABNORMAL HIGH
BUN: 25 mg/dL — ABNORMAL HIGH (ref 6–20)
CALCIUM: 11 mg/dL — ABNORMAL HIGH
CHLORIDE: 114 mmol/L — ABNORMAL HIGH
CO2: 21 mmol/L — ABNORMAL LOW (ref 22–32)
CO2: 21 mmol/L — ABNORMAL LOW
Calcium: 11 mg/dL — ABNORMAL HIGH (ref 8.9–10.3)
Chloride: 114 mmol/L — ABNORMAL HIGH (ref 98–111)
Creatinine, Ser: 3.91 mg/dL — ABNORMAL HIGH (ref 0.61–1.24)
EGFR CKD-EPI AA MALE: 22 mL/min/{1.73_m2} — ABNORMAL LOW
GFR calc Af Amer: 22 mL/min — ABNORMAL LOW (ref >60)
GFR calc non Af Amer: 19 mL/min — ABNORMAL LOW (ref >60)
GLUCOSE RANDOM: 103 mg/dL — ABNORMAL HIGH
Glucose, Bld: 103 mg/dL — ABNORMAL HIGH (ref 70–99)
POTASSIUM: 5.2 mmol/L — ABNORMAL HIGH
Potassium: 5.2 mmol/L — ABNORMAL HIGH (ref 3.5–5.1)
Sodium: 140 mmol/L (ref 135–145)

## 2019-04-13 LAB — TACROLIMUS, TROUGH: Lab: 7.8

## 2019-04-13 LAB — MAGNESIUM
Lab: 1.4 — ABNORMAL LOW
Magnesium: 1.4 mg/dL — ABNORMAL LOW (ref 1.7–2.4)

## 2019-04-13 LAB — ANISOCYTOSIS: Lab: 0

## 2019-04-13 LAB — PHOSPHORUS
Lab: 2.2 — ABNORMAL LOW
Phosphorus: 2.2 mg/dL — ABNORMAL LOW (ref 2.5–4.6)

## 2019-04-13 LAB — SODIUM: Lab: 140

## 2019-04-13 LAB — CBC WITH DIFFERENTIAL/PLATELET
Abs Immature Granulocytes: 0.04 10*3/uL (ref 0.00–0.07)
Basophils Absolute: 0 10*3/uL (ref 0.0–0.1)
Basophils Relative: 1 %
Eosinophils Absolute: 0.1 10*3/uL (ref 0.0–0.5)
Eosinophils Relative: 1 %
HCT: 26.9 % — ABNORMAL LOW (ref 39.0–52.0)
Hemoglobin: 8.3 g/dL — ABNORMAL LOW (ref 13.0–17.0)
Immature Granulocytes: 1 %
Lymphocytes Relative: 2 %
Lymphs Abs: 0.1 10*3/uL — ABNORMAL LOW (ref 0.7–4.0)
MCH: 25.9 pg — ABNORMAL LOW (ref 26.0–34.0)
MCHC: 30.9 g/dL (ref 30.0–36.0)
MCV: 84.1 fL (ref 80.0–100.0)
Monocytes Absolute: 0.5 10*3/uL (ref 0.1–1.0)
Monocytes Relative: 12 %
Neutro Abs: 3.3 10*3/uL (ref 1.7–7.7)
Neutrophils Relative %: 83 %
Platelets: 173 10*3/uL (ref 150–400)
RBC: 3.2 MIL/uL — ABNORMAL LOW (ref 4.22–5.81)
RDW: 18.8 % — ABNORMAL HIGH (ref 11.5–15.5)
WBC: 4 10*3/uL (ref 4.0–10.5)
nRBC: 0 % (ref 0.0–0.2)

## 2019-04-13 LAB — TACROLIMUS LEVEL: Tacrolimus (FK506) - LabCorp: 7.8 ng/mL (ref 2.0–20.0)

## 2019-04-13 NOTE — Unmapped (Signed)
Dialysis discontinued on 03/12/19. He is not ESRD. Anemia due to chronic kidney disease, not on dialysis.    Clelia Croft, DO  Division of Nephrology and Hypertension  [facility]  04/13/2019  12:35 PM

## 2019-04-13 NOTE — Unmapped (Signed)
[facility] Refill Coordination Note    Specialty Medication(s) to be Shipped:   Transplant: Envarsus 1mg , Envarsus 4mg  and valgancyclovir 450mg     Other medication(s) to be shipped: none     Russell Wade, DOB: [birth year]  Phone: [phone] (home) [phone] (work)      All above HIPAA information was verified with patient.     Completed refill call assessment today to schedule patient's medication shipment from the [facility] [phone]).       Specialty medication(s) and dose(s) confirmed: Regimen is correct and unchanged.   Changes to medications: Russell Wade reports no changes at this time.  Changes to insurance: No  Questions for the pharmacist: No    Confirmed patient received Welcome Packet with first shipment. The patient will receive a drug information handout for each medication shipped and additional FDA Medication Guides as required.       DISEASE/MEDICATION-SPECIFIC INFORMATION        N/A    SPECIALTY MEDICATION ADHERENCE     Medication Adherence    Patient reported X missed doses in the last month: 0      envarsus 1mg  : 10 days worth of medication on hand.   envarsus 4mg  : 10 days worth of medication on hand.  valgancyclovir 450mg : 10 days worth of medication on hand.         SHIPPING     Shipping address confirmed in Epic.     Delivery Scheduled: Yes, Expected medication delivery date: 04/20/19.     Medication will be delivered via UPS to the home address in Epic WAM.    Russell Wade   [facility] Specialty Technician

## 2019-04-14 LAB — TACROLIMUS LEVEL: Tacrolimus (FK506) - LabCorp: 19.2 ng/mL (ref 2.0–20.0)

## 2019-04-14 NOTE — Unmapped (Signed)
Pt request for RX Refill

## 2019-04-15 ENCOUNTER — Ambulatory Visit: Admit: 2019-04-15 | Discharge: 2019-04-16 | Payer: MEDICARE

## 2019-04-15 DIAGNOSIS — Z0000 Encounter for general adult medical examination without abnormal findings: Principal | ICD-10-CM

## 2019-04-15 DIAGNOSIS — Z940 Kidney transplant status: Principal | ICD-10-CM

## 2019-04-15 LAB — CBC W/ AUTO DIFF
BASOPHILS ABSOLUTE COUNT: 0 10*9/L (ref 0.0–0.1)
BASOPHILS ABSOLUTE COUNT: 0 10*9/L (ref 0.0–0.1)
BASOPHILS RELATIVE PERCENT: 0.6 %
EOSINOPHILS ABSOLUTE COUNT: 0.1 10*9/L (ref 0.0–0.4)
EOSINOPHILS ABSOLUTE COUNT: 0.1 10*9/L (ref 0.0–0.4)
EOSINOPHILS RELATIVE PERCENT: 3 %
EOSINOPHILS RELATIVE PERCENT: 3.8 %
HEMATOCRIT: 26.8 % — ABNORMAL LOW (ref 41.0–53.0)
HEMATOCRIT: 26.8 % — ABNORMAL LOW (ref 41.0–53.0)
HEMOGLOBIN: 8.6 g/dL — ABNORMAL LOW (ref 13.5–17.5)
LARGE UNSTAINED CELLS: 3 % (ref 0–4)
LARGE UNSTAINED CELLS: 3 % (ref 0–4)
LYMPHOCYTES ABSOLUTE COUNT: 0.1 10*9/L — ABNORMAL LOW (ref 1.5–5.0)
LYMPHOCYTES ABSOLUTE COUNT: 0.2 10*9/L — ABNORMAL LOW (ref 1.5–5.0)
LYMPHOCYTES RELATIVE PERCENT: 3.8 %
LYMPHOCYTES RELATIVE PERCENT: 5.4 %
MEAN CORPUSCULAR HEMOGLOBIN CONC: 32.4 g/dL (ref 31.0–37.0)
MEAN CORPUSCULAR HEMOGLOBIN: 26.7 pg (ref 26.0–34.0)
MEAN CORPUSCULAR HEMOGLOBIN: 26.8 pg (ref 26.0–34.0)
MEAN CORPUSCULAR VOLUME: 82.9 fL (ref 80.0–100.0)
MEAN PLATELET VOLUME: 8.4 fL (ref 7.0–10.0)
MEAN PLATELET VOLUME: 8.7 fL (ref 7.0–10.0)
MONOCYTES ABSOLUTE COUNT: 0.2 10*9/L (ref 0.2–0.8)
MONOCYTES ABSOLUTE COUNT: 0.3 10*9/L (ref 0.2–0.8)
MONOCYTES RELATIVE PERCENT: 8.4 %
MONOCYTES RELATIVE PERCENT: 9.5 %
NEUTROPHILS ABSOLUTE COUNT: 2.1 10*9/L (ref 2.0–7.5)
NEUTROPHILS ABSOLUTE COUNT: 2.2 10*9/L (ref 2.0–7.5)
NEUTROPHILS RELATIVE PERCENT: 77.6 %
NEUTROPHILS RELATIVE PERCENT: 81.7 %
PLATELET COUNT: 179 10*9/L (ref 150–440)
PLATELET COUNT: 179 10*9/L (ref 150–440)
RED BLOOD CELL COUNT: 3.23 10*12/L — ABNORMAL LOW (ref 4.50–5.90)
RED BLOOD CELL COUNT: 3.24 10*12/L — ABNORMAL LOW (ref 4.50–5.90)
RED CELL DISTRIBUTION WIDTH: 21.3 % — ABNORMAL HIGH (ref 12.0–15.0)
RED CELL DISTRIBUTION WIDTH: 21.4 % — ABNORMAL HIGH (ref 12.0–15.0)
WBC ADJUSTED: 2.7 10*9/L — ABNORMAL LOW (ref 4.5–11.0)
WBC ADJUSTED: 2.7 10*9/L — ABNORMAL LOW (ref 4.5–11.0)

## 2019-04-15 LAB — COMPREHENSIVE METABOLIC PANEL
ALBUMIN: 4.7 g/dL (ref 3.5–5.0)
ALKALINE PHOSPHATASE: 145 U/L — ABNORMAL HIGH (ref 38–126)
ALT (SGPT): 9 U/L (ref <50)
AST (SGOT): 16 U/L — ABNORMAL LOW (ref 19–55)
BILIRUBIN TOTAL: 0.5 mg/dL (ref 0.0–1.2)
BLOOD UREA NITROGEN: 24 mg/dL — ABNORMAL HIGH (ref 7–21)
BUN / CREAT RATIO: 7
CALCIUM: 11.4 mg/dL — ABNORMAL HIGH (ref 8.5–10.2)
CHLORIDE: 115 mmol/L — ABNORMAL HIGH (ref 98–107)
CO2: 21 mmol/L — ABNORMAL LOW (ref 22.0–30.0)
CREATININE: 3.33 mg/dL — ABNORMAL HIGH (ref 0.70–1.30)
EGFR CKD-EPI AA MALE: 27 mL/min/{1.73_m2} — ABNORMAL LOW (ref >=60)
EGFR CKD-EPI NON-AA MALE: 23 mL/min/{1.73_m2} — ABNORMAL LOW (ref >=60)
GLUCOSE RANDOM: 107 mg/dL (ref 70–179)
POTASSIUM: 5.7 mmol/L — ABNORMAL HIGH (ref 3.5–5.0)
SODIUM: 146 mmol/L — ABNORMAL HIGH (ref 135–145)

## 2019-04-15 LAB — RENAL FUNCTION PANEL
ALBUMIN: 4.7 g/dL (ref 3.5–5.0)
ANION GAP: 10 mmol/L (ref 7–15)
BLOOD UREA NITROGEN: 24 mg/dL — ABNORMAL HIGH (ref 7–21)
BUN / CREAT RATIO: 7
CALCIUM: 11.4 mg/dL — ABNORMAL HIGH (ref 8.5–10.2)
CO2: 21 mmol/L — ABNORMAL LOW (ref 22.0–30.0)
EGFR CKD-EPI AA MALE: 27 mL/min/{1.73_m2} — ABNORMAL LOW (ref >=60)
EGFR CKD-EPI NON-AA MALE: 23 mL/min/{1.73_m2} — ABNORMAL LOW (ref >=60)
GLUCOSE RANDOM: 107 mg/dL (ref 70–179)
POTASSIUM: 5.7 mmol/L — ABNORMAL HIGH (ref 3.5–5.0)
SODIUM: 146 mmol/L — ABNORMAL HIGH (ref 135–145)

## 2019-04-15 LAB — URINALYSIS
BILIRUBIN UA: NEGATIVE
BLOOD UA: NEGATIVE
GLUCOSE UA: NEGATIVE
KETONES UA: NEGATIVE
LEUKOCYTE ESTERASE UA: NEGATIVE
NITRITE UA: NEGATIVE
PH UA: 6 (ref 5.0–9.0)
PROTEIN UA: NEGATIVE
RBC UA: <1 /HPF (ref <3)
SPECIFIC GRAVITY UA: 1.02 (ref 1.005–1.040)
SQUAMOUS EPITHELIAL: 1 /HPF (ref 0–5)
UROBILINOGEN UA: 0.2
WBC UA: 3 /HPF — ABNORMAL HIGH (ref <2)

## 2019-04-15 LAB — VARIABLE HEMOGLOBIN CONCENTRATION

## 2019-04-15 LAB — TACROLIMUS, TROUGH
Lab: 19.2
Lab: 9

## 2019-04-15 LAB — CO2: Carbon dioxide:SCnc:Pt:Ser/Plas:Qn:: 21 — ABNORMAL LOW

## 2019-04-15 LAB — MAGNESIUM
Magnesium:MCnc:Pt:Ser/Plas:Qn:: 1.4 — ABNORMAL LOW
Magnesium:MCnc:Pt:Ser/Plas:Qn:: 1.5 — ABNORMAL LOW

## 2019-04-15 LAB — PHOSPHORUS: Phosphate:MCnc:Pt:Ser/Plas:Qn:: 2.6 — ABNORMAL LOW

## 2019-04-15 LAB — NEUTROPHILS RELATIVE PERCENT: Neutrophils/100 leukocytes:NFr:Pt:Bld:Qn:Automated count: 81.7

## 2019-04-15 LAB — BUN / CREAT RATIO: Urea nitrogen/Creatinine:MRto:Pt:Ser/Plas:Qn:: 7

## 2019-04-15 LAB — BLOOD UA: Hemoglobin:PrThr:Pt:Urine:Ord:Test strip: NEGATIVE

## 2019-04-15 LAB — TACROLIMUS LEVEL, TROUGH: TACROLIMUS, TROUGH: 19.2 ng/mL

## 2019-04-15 MED ORDER — GABAPENTIN 100 MG CAPSULE
ORAL_CAPSULE | Freq: Every evening | ORAL | 0 refills | 12.00000 days | Status: CP
Start: 2019-04-15 — End: 2019-04-27
  Filled 2019-04-19: qty 12, 12d supply, fill #0

## 2019-04-16 LAB — CMV DNA, QUANTITATIVE, PCR

## 2019-04-16 LAB — CMV COMMENT: Lab: 0

## 2019-04-18 ENCOUNTER — Other Ambulatory Visit (HOSPITAL_COMMUNITY)
Admission: RE | Admit: 2019-04-18 | Discharge: 2019-04-18 | Disposition: A | Discharge status: Home / Self Care | Payer: Medicare Other | Source: Ambulatory Visit | Attending: Nephrology | Admitting: Nephrology

## 2019-04-18 DIAGNOSIS — E559 Vitamin D deficiency, unspecified: Secondary | ICD-10-CM | POA: Insufficient documentation

## 2019-04-18 DIAGNOSIS — N390 Urinary tract infection, site not specified: Secondary | ICD-10-CM | POA: Diagnosis not present

## 2019-04-18 DIAGNOSIS — Z9483 Pancreas transplant status: Secondary | ICD-10-CM | POA: Insufficient documentation

## 2019-04-18 DIAGNOSIS — E1129 Type 2 diabetes mellitus with other diabetic kidney complication: Secondary | ICD-10-CM | POA: Insufficient documentation

## 2019-04-18 DIAGNOSIS — B259 Cytomegaloviral disease, unspecified: Secondary | ICD-10-CM | POA: Insufficient documentation

## 2019-04-18 DIAGNOSIS — D631 Anemia in chronic kidney disease: Secondary | ICD-10-CM | POA: Diagnosis not present

## 2019-04-18 DIAGNOSIS — Z114 Encounter for screening for human immunodeficiency virus [HIV]: Secondary | ICD-10-CM | POA: Insufficient documentation

## 2019-04-18 DIAGNOSIS — Z79899 Other long term (current) drug therapy: Secondary | ICD-10-CM | POA: Diagnosis not present

## 2019-04-18 DIAGNOSIS — D899 Disorder involving the immune mechanism, unspecified: Secondary | ICD-10-CM | POA: Insufficient documentation

## 2019-04-18 DIAGNOSIS — Z940 Kidney transplant status: Secondary | ICD-10-CM | POA: Diagnosis not present

## 2019-04-18 DIAGNOSIS — E1122 Type 2 diabetes mellitus with diabetic chronic kidney disease: Secondary | ICD-10-CM | POA: Diagnosis not present

## 2019-04-18 DIAGNOSIS — Z789 Other specified health status: Secondary | ICD-10-CM | POA: Diagnosis not present

## 2019-04-18 LAB — CBC W/ DIFFERENTIAL
BASOPHILS ABSOLUTE COUNT: 0 10*9/L
BASOPHILS RELATIVE PERCENT: 0 %
EOSINOPHILS ABSOLUTE COUNT: 0.1 10*9/L
EOSINOPHILS RELATIVE PERCENT: 4 %
HEMATOCRIT: 29.5 % — ABNORMAL LOW
HEMOGLOBIN: 9 g/dL — ABNORMAL LOW
LYMPHOCYTES ABSOLUTE COUNT: 0 10*9/L — ABNORMAL LOW
LYMPHOCYTES RELATIVE PERCENT: 1 %
MEAN CORPUSCULAR HEMOGLOBIN CONC: 30.5 g/dL
MEAN CORPUSCULAR HEMOGLOBIN: 25.6 pg — ABNORMAL LOW
MEAN CORPUSCULAR VOLUME: 84 fL
MONOCYTES ABSOLUTE COUNT: 0.1 10*9/L
MONOCYTES RELATIVE PERCENT: 4 %
NEUTROPHILS RELATIVE PERCENT: 91 %
PLATELET COUNT: 179 10*9/L
RED BLOOD CELL COUNT: 3.51 10*12/L — ABNORMAL LOW
RED CELL DISTRIBUTION WIDTH: 19.4 % — ABNORMAL HIGH

## 2019-04-18 LAB — BASIC METABOLIC PANEL
Anion gap: 6 (ref 5–15)
BLOOD UREA NITROGEN: 22 mg/dL — ABNORMAL HIGH
BUN: 22 mg/dL — ABNORMAL HIGH (ref 6–20)
CALCIUM: 10.7 mg/dL — ABNORMAL HIGH
CHLORIDE: 110 mmol/L
CO2: 21 mmol/L — ABNORMAL LOW (ref 22–32)
CO2: 21 mmol/L — ABNORMAL LOW
CREATININE: 3.18 mg/dL — ABNORMAL HIGH
Calcium: 10.7 mg/dL — ABNORMAL HIGH (ref 8.9–10.3)
Chloride: 110 mmol/L (ref 98–111)
Creatinine, Ser: 3.18 mg/dL — ABNORMAL HIGH (ref 0.61–1.24)
EGFR CKD-EPI AA MALE: 28 mL/min/{1.73_m2} — ABNORMAL LOW
GFR calc Af Amer: 28 mL/min — ABNORMAL LOW (ref >60)
GFR calc non Af Amer: 24 mL/min — ABNORMAL LOW (ref >60)
GLUCOSE RANDOM: 123 mg/dL — ABNORMAL HIGH
Glucose, Bld: 123 mg/dL — ABNORMAL HIGH (ref 70–99)
POTASSIUM: 4.9 mmol/L
Potassium: 4.9 mmol/L (ref 3.5–5.1)
Sodium: 137 mmol/L (ref 135–145)

## 2019-04-18 LAB — GLUCOSE RANDOM: Lab: 123 — ABNORMAL HIGH

## 2019-04-18 LAB — HEMATOCRIT: Lab: 29.5 — ABNORMAL LOW

## 2019-04-18 LAB — PHOSPHORUS
Lab: 2.3 — ABNORMAL LOW
Phosphorus: 2.3 mg/dL — ABNORMAL LOW (ref 2.5–4.6)

## 2019-04-18 LAB — MAGNESIUM
Lab: 1.5 — ABNORMAL LOW
Magnesium: 1.5 mg/dL — ABNORMAL LOW (ref 1.7–2.4)

## 2019-04-18 LAB — CBC WITH DIFFERENTIAL/PLATELET
Abs Immature Granulocytes: 0 10*3/uL (ref 0.00–0.07)
Basophils Absolute: 0 10*3/uL (ref 0.0–0.1)
Basophils Relative: 0 %
Eosinophils Absolute: 0.1 10*3/uL (ref 0.0–0.5)
Eosinophils Relative: 4 %
HCT: 29.5 % — ABNORMAL LOW (ref 39.0–52.0)
Hemoglobin: 9 g/dL — ABNORMAL LOW (ref 13.0–17.0)
Lymphocytes Relative: 1 %
Lymphs Abs: 0 10*3/uL — ABNORMAL LOW (ref 0.7–4.0)
MCH: 25.6 pg — ABNORMAL LOW (ref 26.0–34.0)
MCHC: 30.5 g/dL (ref 30.0–36.0)
MCV: 84 fL (ref 80.0–100.0)
Monocytes Absolute: 0.1 10*3/uL (ref 0.1–1.0)
Monocytes Relative: 4 %
Neutro Abs: 2.6 10*3/uL (ref 1.7–7.7)
Neutrophils Relative %: 91 %
Platelets: 179 10*3/uL (ref 150–400)
RBC: 3.51 MIL/uL — ABNORMAL LOW (ref 4.22–5.81)
RDW: 19.4 % — ABNORMAL HIGH (ref 11.5–15.5)
WBC: 2.9 10*3/uL — ABNORMAL LOW (ref 4.0–10.5)
nRBC: 0 % (ref 0.0–0.2)
nRBC: 0 /100 WBC

## 2019-04-19 ENCOUNTER — Other Ambulatory Visit (HOSPITAL_COMMUNITY)
Admission: AD | Admit: 2019-04-19 | Discharge: 2019-04-19 | Disposition: A | Discharge status: Home / Self Care | Payer: Medicare Other | Source: Ambulatory Visit | Attending: Nephrology | Admitting: Nephrology

## 2019-04-19 DIAGNOSIS — E1129 Type 2 diabetes mellitus with other diabetic kidney complication: Secondary | ICD-10-CM | POA: Insufficient documentation

## 2019-04-19 DIAGNOSIS — D631 Anemia in chronic kidney disease: Secondary | ICD-10-CM | POA: Diagnosis not present

## 2019-04-19 DIAGNOSIS — D899 Disorder involving the immune mechanism, unspecified: Secondary | ICD-10-CM | POA: Insufficient documentation

## 2019-04-19 DIAGNOSIS — Z789 Other specified health status: Secondary | ICD-10-CM | POA: Diagnosis not present

## 2019-04-19 DIAGNOSIS — B259 Cytomegaloviral disease, unspecified: Secondary | ICD-10-CM | POA: Diagnosis not present

## 2019-04-19 DIAGNOSIS — Z79899 Other long term (current) drug therapy: Secondary | ICD-10-CM | POA: Diagnosis not present

## 2019-04-19 DIAGNOSIS — Z940 Kidney transplant status: Secondary | ICD-10-CM | POA: Diagnosis not present

## 2019-04-19 DIAGNOSIS — Z09 Encounter for follow-up examination after completed treatment for conditions other than malignant neoplasm: Secondary | ICD-10-CM | POA: Insufficient documentation

## 2019-04-19 DIAGNOSIS — Z114 Encounter for screening for human immunodeficiency virus [HIV]: Secondary | ICD-10-CM | POA: Insufficient documentation

## 2019-04-19 DIAGNOSIS — E559 Vitamin D deficiency, unspecified: Secondary | ICD-10-CM | POA: Insufficient documentation

## 2019-04-19 DIAGNOSIS — Z9483 Pancreas transplant status: Secondary | ICD-10-CM | POA: Insufficient documentation

## 2019-04-19 LAB — BASIC METABOLIC PANEL
Anion gap: 9 (ref 5–15)
BUN: 22 mg/dL — ABNORMAL HIGH (ref 6–20)
CO2: 20 mmol/L — ABNORMAL LOW (ref 22–32)
Calcium: 11.3 mg/dL — ABNORMAL HIGH (ref 8.9–10.3)
Chloride: 111 mmol/L (ref 98–111)
Creatinine, Ser: 3.1 mg/dL — ABNORMAL HIGH (ref 0.61–1.24)
GFR calc Af Amer: 29 mL/min — ABNORMAL LOW (ref >60)
GFR calc non Af Amer: 25 mL/min — ABNORMAL LOW (ref >60)
Glucose, Bld: 105 mg/dL — ABNORMAL HIGH (ref 70–99)
Potassium: 5.5 mmol/L — ABNORMAL HIGH (ref 3.5–5.1)
Sodium: 140 mmol/L (ref 135–145)

## 2019-04-19 LAB — MAGNESIUM: Magnesium: 1.4 mg/dL — ABNORMAL LOW (ref 1.7–2.4)

## 2019-04-19 LAB — CBC WITH DIFFERENTIAL/PLATELET
Abs Immature Granulocytes: 0.22 10*3/uL — ABNORMAL HIGH (ref 0.00–0.07)
Basophils Absolute: 0 10*3/uL (ref 0.0–0.1)
Basophils Relative: 0 %
Eosinophils Absolute: 0.1 10*3/uL (ref 0.0–0.5)
Eosinophils Relative: 2 %
HCT: 30 % — ABNORMAL LOW (ref 39.0–52.0)
Hemoglobin: 9.3 g/dL — ABNORMAL LOW (ref 13.0–17.0)
Immature Granulocytes: 5 %
Lymphocytes Relative: 2 %
Lymphs Abs: 0.1 10*3/uL — ABNORMAL LOW (ref 0.7–4.0)
MCH: 26.1 pg (ref 26.0–34.0)
MCHC: 31 g/dL (ref 30.0–36.0)
MCV: 84 fL (ref 80.0–100.0)
Monocytes Absolute: 0.5 10*3/uL (ref 0.1–1.0)
Monocytes Relative: 11 %
Neutro Abs: 3.8 10*3/uL (ref 1.7–7.7)
Neutrophils Relative %: 80 %
Platelets: 180 10*3/uL (ref 150–400)
RBC: 3.57 MIL/uL — ABNORMAL LOW (ref 4.22–5.81)
RDW: 19.4 % — ABNORMAL HIGH (ref 11.5–15.5)
WBC: 4.7 10*3/uL (ref 4.0–10.5)
nRBC: 0 % (ref 0.0–0.2)

## 2019-04-19 LAB — PHOSPHORUS: Phosphorus: 2.6 mg/dL (ref 2.5–4.6)

## 2019-04-19 MED FILL — VALGANCICLOVIR 450 MG TABLET: ORAL | 28 days supply | Qty: 8 | Fill #1

## 2019-04-19 MED FILL — ENVARSUS XR 1 MG TABLET,EXTENDED RELEASE: 30 days supply | Qty: 30 | Fill #1 | Status: AC

## 2019-04-19 MED FILL — ENVARSUS XR 4 MG TABLET,EXTENDED RELEASE: 30 days supply | Qty: 150 | Fill #1 | Status: AC

## 2019-04-19 MED FILL — GABAPENTIN 100 MG CAPSULE: 12 days supply | Qty: 12 | Fill #0 | Status: AC

## 2019-04-19 MED FILL — ENVARSUS XR 4 MG TABLET,EXTENDED RELEASE: 30 days supply | Qty: 150 | Fill #1

## 2019-04-19 MED FILL — ENVARSUS XR 1 MG TABLET,EXTENDED RELEASE: 30 days supply | Qty: 30 | Fill #1

## 2019-04-19 MED FILL — VALGANCICLOVIR 450 MG TABLET: 28 days supply | Qty: 8 | Fill #1 | Status: AC

## 2019-04-20 LAB — BASIC METABOLIC PANEL
CALCIUM: 11.3 mg/dL — ABNORMAL HIGH
CREATININE: 3.1 mg/dL — ABNORMAL HIGH
EGFR CKD-EPI AA MALE: 29 mL/min/{1.73_m2} — ABNORMAL LOW
GLUCOSE RANDOM: 105 mg/dL — ABNORMAL HIGH
POTASSIUM: 5.5 mmol/L — ABNORMAL HIGH
SODIUM: 140 mmol/L

## 2019-04-20 LAB — CBC W/ DIFFERENTIAL
BASOPHILS ABSOLUTE COUNT: 0 10*9/L
BASOPHILS RELATIVE PERCENT: 0 %
EOSINOPHILS ABSOLUTE COUNT: 0.1 10*9/L
EOSINOPHILS RELATIVE PERCENT: 2 %
HEMATOCRIT: 30 % — ABNORMAL LOW
HEMOGLOBIN: 9.3 g/dL — ABNORMAL LOW
LYMPHOCYTES ABSOLUTE COUNT: 0.1 10*9/L — ABNORMAL LOW
LYMPHOCYTES RELATIVE PERCENT: 2 %
MEAN CORPUSCULAR HEMOGLOBIN CONC: 31 g/dL
MONOCYTES ABSOLUTE COUNT: 0.5 10*9/L
NEUTROPHILS ABSOLUTE COUNT: 3.8 10*9/L
NEUTROPHILS RELATIVE PERCENT: 80 %
PLATELET COUNT: 180 10*9/L
RED BLOOD CELL COUNT: 3.57 10*12/L — ABNORMAL LOW
RED CELL DISTRIBUTION WIDTH: 19.4 % — ABNORMAL HIGH
WBC ADJUSTED: 4.7 10*9/L

## 2019-04-20 LAB — PHOSPHORUS: Lab: 2.6

## 2019-04-20 LAB — MONOCYTES ABSOLUTE COUNT: Lab: 0.5

## 2019-04-20 LAB — SODIUM: Lab: 140

## 2019-04-20 LAB — MAGNESIUM: Lab: 1.4 — ABNORMAL LOW

## 2019-04-20 LAB — TACROLIMUS, TROUGH: Lab: 12.6

## 2019-04-20 LAB — TACROLIMUS LEVEL: Tacrolimus (FK506) - LabCorp: 12.6 ng/mL (ref 2.0–20.0)

## 2019-04-20 NOTE — Unmapped (Signed)
Prograf level  12.1 there is a discrepancy with [facility] results. Pt will repeat labs on Friday at a [facility] lab

## 2019-04-21 LAB — TACROLIMUS LEVEL: Tacrolimus (FK506) - LabCorp: 9.9 ng/mL (ref 2.0–20.0)

## 2019-04-22 ENCOUNTER — Other Ambulatory Visit (HOSPITAL_COMMUNITY)
Admission: RE | Admit: 2019-04-22 | Discharge: 2019-04-22 | Disposition: A | Discharge status: Home / Self Care | Payer: Medicare Other | Source: Other Acute Inpatient Hospital | Attending: Nephrology | Admitting: Nephrology

## 2019-04-22 DIAGNOSIS — E1129 Type 2 diabetes mellitus with other diabetic kidney complication: Secondary | ICD-10-CM | POA: Diagnosis not present

## 2019-04-22 DIAGNOSIS — N390 Urinary tract infection, site not specified: Secondary | ICD-10-CM | POA: Diagnosis not present

## 2019-04-22 DIAGNOSIS — D899 Disorder involving the immune mechanism, unspecified: Secondary | ICD-10-CM | POA: Insufficient documentation

## 2019-04-22 DIAGNOSIS — Z940 Kidney transplant status: Secondary | ICD-10-CM | POA: Insufficient documentation

## 2019-04-22 DIAGNOSIS — B259 Cytomegaloviral disease, unspecified: Secondary | ICD-10-CM | POA: Insufficient documentation

## 2019-04-22 DIAGNOSIS — Z789 Other specified health status: Secondary | ICD-10-CM | POA: Diagnosis not present

## 2019-04-22 DIAGNOSIS — E1122 Type 2 diabetes mellitus with diabetic chronic kidney disease: Secondary | ICD-10-CM | POA: Diagnosis not present

## 2019-04-22 DIAGNOSIS — Z9483 Pancreas transplant status: Secondary | ICD-10-CM | POA: Diagnosis not present

## 2019-04-22 DIAGNOSIS — D631 Anemia in chronic kidney disease: Secondary | ICD-10-CM | POA: Diagnosis not present

## 2019-04-22 DIAGNOSIS — Z114 Encounter for screening for human immunodeficiency virus [HIV]: Secondary | ICD-10-CM | POA: Diagnosis not present

## 2019-04-22 DIAGNOSIS — E559 Vitamin D deficiency, unspecified: Secondary | ICD-10-CM | POA: Insufficient documentation

## 2019-04-22 DIAGNOSIS — Z79899 Other long term (current) drug therapy: Secondary | ICD-10-CM | POA: Diagnosis not present

## 2019-04-22 LAB — PHOSPHORUS
Lab: 2.8
Phosphorus: 2.8 mg/dL (ref 2.5–4.6)

## 2019-04-22 LAB — BASIC METABOLIC PANEL
Anion gap: 6 (ref 5–15)
BUN: 28 mg/dL — ABNORMAL HIGH (ref 6–20)
CALCIUM: 11.4 mg/dL — ABNORMAL HIGH
CHLORIDE: 114 mmol/L — ABNORMAL HIGH
CO2: 19 mmol/L — ABNORMAL LOW (ref 22–32)
CO2: 19 mmol/L — ABNORMAL LOW
CREATININE: 2.99 mg/dL — ABNORMAL HIGH
Calcium: 11.4 mg/dL — ABNORMAL HIGH (ref 8.9–10.3)
Chloride: 114 mmol/L — ABNORMAL HIGH (ref 98–111)
Creatinine, Ser: 2.99 mg/dL — ABNORMAL HIGH (ref 0.61–1.24)
EGFR CKD-EPI AA MALE: 30 mL/min/{1.73_m2} — ABNORMAL LOW
GFR calc Af Amer: 30 mL/min — ABNORMAL LOW (ref >60)
GFR calc non Af Amer: 26 mL/min — ABNORMAL LOW (ref >60)
GLUCOSE RANDOM: 100 mg/dL — ABNORMAL HIGH
Glucose, Bld: 100 mg/dL — ABNORMAL HIGH (ref 70–99)
POTASSIUM: 5.8 mmol/L — ABNORMAL HIGH
Potassium: 5.8 mmol/L — ABNORMAL HIGH (ref 3.5–5.1)
SODIUM: 139 mmol/L
Sodium: 139 mmol/L (ref 135–145)

## 2019-04-22 LAB — CBC W/ DIFFERENTIAL
BASOPHILS RELATIVE PERCENT: 1 %
EOSINOPHILS ABSOLUTE COUNT: 0.1 10*9/L
EOSINOPHILS RELATIVE PERCENT: 2 %
HEMATOCRIT: 29.9 % — ABNORMAL LOW
HEMOGLOBIN: 9.3 g/dL — ABNORMAL LOW
LYMPHOCYTES ABSOLUTE COUNT: 0 10*9/L — ABNORMAL LOW
LYMPHOCYTES RELATIVE PERCENT: 0 %
MEAN CORPUSCULAR HEMOGLOBIN CONC: 31.1 g/dL
MEAN CORPUSCULAR HEMOGLOBIN: 25.7 pg — ABNORMAL LOW
MEAN CORPUSCULAR VOLUME: 82.6 fL
MONOCYTES ABSOLUTE COUNT: 0.4 10*9/L
MONOCYTES RELATIVE PERCENT: 12 %
NEUTROPHILS ABSOLUTE COUNT: 2.8 10*9/L
NEUTROPHILS RELATIVE PERCENT: 85 %
PLATELET COUNT: 178 10*9/L
RED BLOOD CELL COUNT: 3.62 10*12/L — ABNORMAL LOW
RED CELL DISTRIBUTION WIDTH: 18.4 % — ABNORMAL HIGH
WHITE BLOOD CELL COUNT: 3.3 10*9/L — ABNORMAL LOW

## 2019-04-22 LAB — CREATININE: Lab: 2.99 — ABNORMAL HIGH

## 2019-04-22 LAB — MAGNESIUM
Lab: 1.8
Magnesium: 1.8 mg/dL (ref 1.7–2.4)

## 2019-04-22 LAB — WBC ADJUSTED: Lab: 0

## 2019-04-22 LAB — TACROLIMUS, TROUGH: Lab: 9.9

## 2019-04-22 LAB — CBC WITH DIFFERENTIAL/PLATELET
Abs Immature Granulocytes: 0 10*3/uL (ref 0.00–0.07)
Basophils Absolute: 0 10*3/uL (ref 0.0–0.1)
Basophils Relative: 1 %
Eosinophils Absolute: 0.1 10*3/uL (ref 0.0–0.5)
Eosinophils Relative: 2 %
HCT: 29.9 % — ABNORMAL LOW (ref 39.0–52.0)
Hemoglobin: 9.3 g/dL — ABNORMAL LOW (ref 13.0–17.0)
Lymphocytes Relative: 0 %
Lymphs Abs: 0 10*3/uL — ABNORMAL LOW (ref 0.7–4.0)
MCH: 25.7 pg — ABNORMAL LOW (ref 26.0–34.0)
MCHC: 31.1 g/dL (ref 30.0–36.0)
MCV: 82.6 fL (ref 80.0–100.0)
Monocytes Absolute: 0.4 10*3/uL (ref 0.1–1.0)
Monocytes Relative: 12 %
Neutro Abs: 2.8 10*3/uL (ref 1.7–7.7)
Neutrophils Relative %: 85 %
Platelets: 178 10*3/uL (ref 150–400)
RBC: 3.62 MIL/uL — ABNORMAL LOW (ref 4.22–5.81)
RDW: 18.4 % — ABNORMAL HIGH (ref 11.5–15.5)
WBC: 3.3 10*3/uL — ABNORMAL LOW (ref 4.0–10.5)
nRBC: 0 % (ref 0.0–0.2)
nRBC: 0 /100 WBC

## 2019-04-23 LAB — TACROLIMUS LEVEL: Tacrolimus (FK506) - LabCorp: 15.9 ng/mL (ref 2.0–20.0)

## 2019-04-25 ENCOUNTER — Other Ambulatory Visit (HOSPITAL_COMMUNITY)
Admission: AD | Admit: 2019-04-25 | Discharge: 2019-04-25 | Disposition: A | Discharge status: Home / Self Care | Payer: Medicare Other | Source: Ambulatory Visit | Attending: Nephrology | Admitting: Nephrology

## 2019-04-25 DIAGNOSIS — D899 Disorder involving the immune mechanism, unspecified: Secondary | ICD-10-CM | POA: Diagnosis not present

## 2019-04-25 DIAGNOSIS — Z79899 Other long term (current) drug therapy: Secondary | ICD-10-CM | POA: Insufficient documentation

## 2019-04-25 DIAGNOSIS — Z789 Other specified health status: Secondary | ICD-10-CM | POA: Diagnosis not present

## 2019-04-25 DIAGNOSIS — Z9483 Pancreas transplant status: Secondary | ICD-10-CM | POA: Insufficient documentation

## 2019-04-25 DIAGNOSIS — N189 Chronic kidney disease, unspecified: Secondary | ICD-10-CM | POA: Diagnosis not present

## 2019-04-25 DIAGNOSIS — Z940 Kidney transplant status: Secondary | ICD-10-CM | POA: Diagnosis not present

## 2019-04-25 DIAGNOSIS — E559 Vitamin D deficiency, unspecified: Secondary | ICD-10-CM | POA: Insufficient documentation

## 2019-04-25 DIAGNOSIS — B259 Cytomegaloviral disease, unspecified: Secondary | ICD-10-CM | POA: Diagnosis not present

## 2019-04-25 DIAGNOSIS — E1129 Type 2 diabetes mellitus with other diabetic kidney complication: Secondary | ICD-10-CM | POA: Insufficient documentation

## 2019-04-25 DIAGNOSIS — D631 Anemia in chronic kidney disease: Secondary | ICD-10-CM | POA: Diagnosis not present

## 2019-04-25 DIAGNOSIS — Z114 Encounter for screening for human immunodeficiency virus [HIV]: Secondary | ICD-10-CM | POA: Diagnosis not present

## 2019-04-25 DIAGNOSIS — Z09 Encounter for follow-up examination after completed treatment for conditions other than malignant neoplasm: Secondary | ICD-10-CM | POA: Diagnosis not present

## 2019-04-25 DIAGNOSIS — N390 Urinary tract infection, site not specified: Secondary | ICD-10-CM | POA: Diagnosis not present

## 2019-04-25 LAB — TACROLIMUS LEVEL, TROUGH: TACROLIMUS, TROUGH: 15.9 ng/mL

## 2019-04-25 LAB — BASIC METABOLIC PANEL
Anion gap: 7 (ref 5–15)
BUN: 31 mg/dL — ABNORMAL HIGH (ref 6–20)
CHLORIDE: 113 mmol/L — ABNORMAL HIGH
CO2: 18 mmol/L — ABNORMAL LOW (ref 22–32)
CO2: 18 mmol/L — ABNORMAL LOW
CREATININE: 3.2 mg/dL — ABNORMAL HIGH
Calcium: 11.3 mg/dL — ABNORMAL HIGH (ref 8.9–10.3)
Chloride: 113 mmol/L — ABNORMAL HIGH (ref 98–111)
Creatinine, Ser: 3.2 mg/dL — ABNORMAL HIGH (ref 0.61–1.24)
EGFR CKD-EPI AA MALE: 28 mL/min/{1.73_m2} — ABNORMAL LOW
GFR calc Af Amer: 28 mL/min — ABNORMAL LOW (ref >60)
GFR calc non Af Amer: 24 mL/min — ABNORMAL LOW (ref >60)
GLUCOSE RANDOM: 101 mg/dL — ABNORMAL HIGH
Glucose, Bld: 101 mg/dL — ABNORMAL HIGH (ref 70–99)
POTASSIUM: 5.9 mmol/L — ABNORMAL HIGH
Potassium: 5.9 mmol/L — ABNORMAL HIGH (ref 3.5–5.1)
SODIUM: 138 mmol/L
Sodium: 138 mmol/L (ref 135–145)

## 2019-04-25 LAB — CBC W/ DIFFERENTIAL
BASOPHILS ABSOLUTE COUNT: 0 10*9/L
BASOPHILS RELATIVE PERCENT: 1 %
EOSINOPHILS ABSOLUTE COUNT: 0.1 10*9/L
EOSINOPHILS RELATIVE PERCENT: 4 %
HEMATOCRIT: 33.1 % — ABNORMAL LOW
HEMOGLOBIN: 10.1 g/dL — ABNORMAL LOW
LYMPHOCYTES ABSOLUTE COUNT: 0.2 10*9/L — ABNORMAL LOW
LYMPHOCYTES RELATIVE PERCENT: 5 %
MEAN CORPUSCULAR HEMOGLOBIN CONC: 30.5 g/dL
MEAN CORPUSCULAR HEMOGLOBIN: 25.8 pg — ABNORMAL LOW
MEAN CORPUSCULAR VOLUME: 84.7 fL
MONOCYTES RELATIVE PERCENT: 6 %
NEUTROPHILS ABSOLUTE COUNT: 3 10*9/L
PLATELET COUNT: 177 10*9/L
RED BLOOD CELL COUNT: 3.91 10*12/L — ABNORMAL LOW
RED CELL DISTRIBUTION WIDTH: 18.6 % — ABNORMAL HIGH
WBC ADJUSTED: 3.6 10*9/L — ABNORMAL LOW

## 2019-04-25 LAB — MAGNESIUM
Lab: 1.9
Magnesium: 1.9 mg/dL (ref 1.7–2.4)

## 2019-04-25 LAB — NEUTROPHILS ABSOLUTE COUNT: Lab: 3

## 2019-04-25 LAB — TACROLIMUS, TROUGH: Lab: 15.9

## 2019-04-25 LAB — BLOOD UREA NITROGEN: Lab: 31 — ABNORMAL HIGH

## 2019-04-25 LAB — PHOSPHORUS
Lab: 3.1
Phosphorus: 3.1 mg/dL (ref 2.5–4.6)

## 2019-04-25 LAB — CBC WITH DIFFERENTIAL/PLATELET
Abs Immature Granulocytes: 0 10*3/uL (ref 0.00–0.07)
Basophils Absolute: 0 10*3/uL (ref 0.0–0.1)
Basophils Relative: 1 %
Eosinophils Absolute: 0.1 10*3/uL (ref 0.0–0.5)
Eosinophils Relative: 4 %
HCT: 33.1 % — ABNORMAL LOW (ref 39.0–52.0)
Hemoglobin: 10.1 g/dL — ABNORMAL LOW (ref 13.0–17.0)
Lymphocytes Relative: 5 %
Lymphs Abs: 0.2 10*3/uL — ABNORMAL LOW (ref 0.7–4.0)
MCH: 25.8 pg — ABNORMAL LOW (ref 26.0–34.0)
MCHC: 30.5 g/dL (ref 30.0–36.0)
MCV: 84.7 fL (ref 80.0–100.0)
Monocytes Absolute: 0.2 10*3/uL (ref 0.1–1.0)
Monocytes Relative: 6 %
Neutro Abs: 3 10*3/uL (ref 1.7–7.7)
Neutrophils Relative %: 84 %
Platelets: 177 10*3/uL (ref 150–400)
RBC: 3.91 MIL/uL — ABNORMAL LOW (ref 4.22–5.81)
RDW: 18.6 % — ABNORMAL HIGH (ref 11.5–15.5)
WBC: 3.6 10*3/uL — ABNORMAL LOW (ref 4.0–10.5)
nRBC: 0 % (ref 0.0–0.2)
nRBC: 1 /100 WBC — ABNORMAL HIGH

## 2019-04-26 LAB — TACROLIMUS LEVEL: Tacrolimus (FK506) - LabCorp: 10.1 ng/mL (ref 2.0–20.0)

## 2019-04-27 ENCOUNTER — Ambulatory Visit: Admit: 2019-04-27 | Discharge: 2019-04-28 | Payer: MEDICARE

## 2019-04-27 DIAGNOSIS — Z79899 Other long term (current) drug therapy: Secondary | ICD-10-CM | POA: Diagnosis not present

## 2019-04-27 DIAGNOSIS — Z940 Kidney transplant status: Secondary | ICD-10-CM | POA: Diagnosis not present

## 2019-04-27 LAB — RENAL FUNCTION PANEL
ANION GAP: 7 mmol/L (ref 7–15)
BLOOD UREA NITROGEN: 30 mg/dL — ABNORMAL HIGH (ref 7–21)
BUN / CREAT RATIO: 9
CALCIUM: 11.8 mg/dL — ABNORMAL HIGH (ref 8.5–10.2)
CHLORIDE: 114 mmol/L — ABNORMAL HIGH (ref 98–107)
CO2: 21 mmol/L — ABNORMAL LOW (ref 22.0–30.0)
CREATININE: 3.23 mg/dL — ABNORMAL HIGH (ref 0.70–1.30)
EGFR CKD-EPI AA MALE: 28 mL/min/{1.73_m2} — ABNORMAL LOW (ref >=60)
EGFR CKD-EPI NON-AA MALE: 24 mL/min/{1.73_m2} — ABNORMAL LOW (ref >=60)
GLUCOSE RANDOM: 110 mg/dL (ref 70–179)
PHOSPHORUS: 3.5 mg/dL (ref 2.9–4.7)
POTASSIUM: 6 mmol/L — ABNORMAL HIGH (ref 3.5–5.0)
SODIUM: 142 mmol/L (ref 135–145)

## 2019-04-27 LAB — CBC W/ AUTO DIFF
BASOPHILS ABSOLUTE COUNT: 0 10*9/L (ref 0.0–0.1)
BASOPHILS RELATIVE PERCENT: 0.6 %
EOSINOPHILS ABSOLUTE COUNT: 0.2 10*9/L (ref 0.0–0.4)
EOSINOPHILS RELATIVE PERCENT: 4.2 %
HEMOGLOBIN: 9.9 g/dL — ABNORMAL LOW (ref 13.5–17.5)
LARGE UNSTAINED CELLS: 2 % (ref 0–4)
LYMPHOCYTES RELATIVE PERCENT: 4.6 %
MEAN CORPUSCULAR HEMOGLOBIN CONC: 31.6 g/dL (ref 31.0–37.0)
MEAN CORPUSCULAR HEMOGLOBIN: 26 pg (ref 26.0–34.0)
MEAN CORPUSCULAR VOLUME: 82.3 fL (ref 80.0–100.0)
MONOCYTES ABSOLUTE COUNT: 0.3 10*9/L (ref 0.2–0.8)
MONOCYTES RELATIVE PERCENT: 7.7 %
NEUTROPHILS ABSOLUTE COUNT: 3.1 10*9/L (ref 2.0–7.5)
NEUTROPHILS RELATIVE PERCENT: 81.2 %
PLATELET COUNT: 154 10*9/L (ref 150–440)
RED CELL DISTRIBUTION WIDTH: 19 % — ABNORMAL HIGH (ref 12.0–15.0)
WBC ADJUSTED: 3.8 10*9/L — ABNORMAL LOW (ref 4.5–11.0)

## 2019-04-27 LAB — MAGNESIUM: Magnesium:MCnc:Pt:Ser/Plas:Qn:: 1.8

## 2019-04-27 LAB — LYMPHOCYTES RELATIVE PERCENT: Lymphocytes/100 leukocytes:NFr:Pt:Bld:Qn:Automated count: 4.6

## 2019-04-27 LAB — SMEAR REVIEW

## 2019-04-27 LAB — CALCIUM: Calcium:MCnc:Pt:Ser/Plas:Qn:: 11.8 — ABNORMAL HIGH

## 2019-04-27 NOTE — Unmapped (Signed)
Potassium 6.0 with Dr Gwynneth Munson. Pt advised to increase lokela dose to 10mg  TID for 1 day and resume 5mg  every other day. Will repeat labs Friday

## 2019-04-27 NOTE — Unmapped (Signed)
Pt will repeat trough at a [facility] lab today. Last level 15.3 unsure if it is a real level due to reference lab discrepancies.

## 2019-04-28 LAB — TACROLIMUS BLOOD: Lab: 10.3

## 2019-04-28 LAB — CMV DNA, QUANTITATIVE, PCR

## 2019-04-28 LAB — TACROLIMUS LEVEL: TACROLIMUS BLOOD: 10.3 ng/mL

## 2019-04-28 LAB — CMV QUANT LOG10: Lab: 0

## 2019-04-28 LAB — TACROLIMUS, TROUGH: Lab: 10.1

## 2019-04-29 ENCOUNTER — Other Ambulatory Visit (HOSPITAL_COMMUNITY)
Admission: RE | Admit: 2019-04-29 | Discharge: 2019-04-29 | Disposition: A | Discharge status: Home / Self Care | Payer: Medicare Other | Source: Ambulatory Visit | Attending: Nephrology | Admitting: Nephrology

## 2019-04-29 DIAGNOSIS — Z9483 Pancreas transplant status: Secondary | ICD-10-CM | POA: Diagnosis not present

## 2019-04-29 DIAGNOSIS — E1129 Type 2 diabetes mellitus with other diabetic kidney complication: Secondary | ICD-10-CM | POA: Diagnosis not present

## 2019-04-29 DIAGNOSIS — Z789 Other specified health status: Secondary | ICD-10-CM | POA: Insufficient documentation

## 2019-04-29 DIAGNOSIS — Z940 Kidney transplant status: Secondary | ICD-10-CM | POA: Diagnosis not present

## 2019-04-29 DIAGNOSIS — Z79899 Other long term (current) drug therapy: Secondary | ICD-10-CM | POA: Insufficient documentation

## 2019-04-29 DIAGNOSIS — D631 Anemia in chronic kidney disease: Secondary | ICD-10-CM | POA: Insufficient documentation

## 2019-04-29 DIAGNOSIS — E559 Vitamin D deficiency, unspecified: Secondary | ICD-10-CM | POA: Insufficient documentation

## 2019-04-29 DIAGNOSIS — B259 Cytomegaloviral disease, unspecified: Secondary | ICD-10-CM | POA: Diagnosis not present

## 2019-04-29 DIAGNOSIS — Z114 Encounter for screening for human immunodeficiency virus [HIV]: Secondary | ICD-10-CM | POA: Insufficient documentation

## 2019-04-29 DIAGNOSIS — D899 Disorder involving the immune mechanism, unspecified: Secondary | ICD-10-CM | POA: Diagnosis not present

## 2019-04-29 LAB — CBC W/ DIFFERENTIAL
BASOPHILS RELATIVE PERCENT: 1 %
EOSINOPHILS ABSOLUTE COUNT: 0.1 10*9/L
EOSINOPHILS RELATIVE PERCENT: 2 %
HEMATOCRIT: 30.4 % — ABNORMAL LOW
HEMOGLOBIN: 9.4 g/dL — ABNORMAL LOW
LYMPHOCYTES ABSOLUTE COUNT: 0.1 10*9/L — ABNORMAL LOW
LYMPHOCYTES RELATIVE PERCENT: 3 %
MEAN CORPUSCULAR HEMOGLOBIN CONC: 30.9 g/dL
MEAN CORPUSCULAR HEMOGLOBIN: 25.7 pg — ABNORMAL LOW
MEAN CORPUSCULAR VOLUME: 83.1 fL
MONOCYTES ABSOLUTE COUNT: 0.5 10*9/L
MONOCYTES RELATIVE PERCENT: 12 %
NEUTROPHILS ABSOLUTE COUNT: 3.1 10*9/L
NEUTROPHILS RELATIVE PERCENT: 69 %
PLATELET COUNT: 160 10*9/L
RED BLOOD CELL COUNT: 3.66 10*12/L — ABNORMAL LOW
RED CELL DISTRIBUTION WIDTH: 17.5 % — ABNORMAL HIGH
WHITE BLOOD CELL COUNT: 4.4 10*9/L

## 2019-04-29 LAB — BASIC METABOLIC PANEL
Anion gap: 8 (ref 5–15)
BLOOD UREA NITROGEN: 28 mg/dL — ABNORMAL HIGH
BUN: 28 mg/dL — ABNORMAL HIGH (ref 6–20)
CALCIUM: 10.1 mg/dL
CHLORIDE: 110 mmol/L
CO2: 20 mmol/L — ABNORMAL LOW (ref 22–32)
CO2: 20 mmol/L — ABNORMAL LOW
CREATININE: 3.08 mg/dL — ABNORMAL HIGH
Calcium: 10.1 mg/dL (ref 8.9–10.3)
Chloride: 110 mmol/L (ref 98–111)
Creatinine, Ser: 3.08 mg/dL — ABNORMAL HIGH (ref 0.61–1.24)
EGFR CKD-EPI AA MALE: 29 mL/min/{1.73_m2} — ABNORMAL LOW
GFR calc Af Amer: 29 mL/min — ABNORMAL LOW (ref >60)
GFR calc non Af Amer: 25 mL/min — ABNORMAL LOW (ref >60)
GLUCOSE RANDOM: 115 mg/dL — ABNORMAL HIGH
Glucose, Bld: 115 mg/dL — ABNORMAL HIGH (ref 70–99)
Potassium: 5.3 mmol/L — ABNORMAL HIGH (ref 3.5–5.1)
SODIUM: 138 mmol/L
Sodium: 138 mmol/L (ref 135–145)

## 2019-04-29 LAB — CHLORIDE: Lab: 110

## 2019-04-29 LAB — MAGNESIUM
Lab: 1.6 — ABNORMAL LOW
Magnesium: 1.6 mg/dL — ABNORMAL LOW (ref 1.7–2.4)

## 2019-04-29 LAB — NEUTROPHILS ABSOLUTE COUNT: Lab: 3.1

## 2019-04-29 LAB — PHOSPHORUS
Lab: 2.3 — ABNORMAL LOW
Phosphorus: 2.3 mg/dL — ABNORMAL LOW (ref 2.5–4.6)

## 2019-04-29 LAB — CBC WITH DIFFERENTIAL/PLATELET
Abs Immature Granulocytes: 0.56 10*3/uL — ABNORMAL HIGH (ref 0.00–0.07)
Basophils Absolute: 0 10*3/uL (ref 0.0–0.1)
Basophils Relative: 1 %
Eosinophils Absolute: 0.1 10*3/uL (ref 0.0–0.5)
Eosinophils Relative: 2 %
HCT: 30.4 % — ABNORMAL LOW (ref 39.0–52.0)
Hemoglobin: 9.4 g/dL — ABNORMAL LOW (ref 13.0–17.0)
Immature Granulocytes: 13 %
Lymphocytes Relative: 3 %
Lymphs Abs: 0.1 10*3/uL — ABNORMAL LOW (ref 0.7–4.0)
MCH: 25.7 pg — ABNORMAL LOW (ref 26.0–34.0)
MCHC: 30.9 g/dL (ref 30.0–36.0)
MCV: 83.1 fL (ref 80.0–100.0)
Monocytes Absolute: 0.5 10*3/uL (ref 0.1–1.0)
Monocytes Relative: 12 %
Neutro Abs: 3.1 10*3/uL (ref 1.7–7.7)
Neutrophils Relative %: 69 %
Platelets: 160 10*3/uL (ref 150–400)
RBC: 3.66 MIL/uL — ABNORMAL LOW (ref 4.22–5.81)
RDW: 17.5 % — ABNORMAL HIGH (ref 11.5–15.5)
WBC: 4.4 10*3/uL (ref 4.0–10.5)
nRBC: 0 % (ref 0.0–0.2)

## 2019-04-29 NOTE — Unmapped (Signed)
Pt advised to decrease Envarsus dose to 20 mg daily. Last dose 10.3

## 2019-04-30 LAB — TACROLIMUS LEVEL: Tacrolimus (FK506) - LabCorp: 14.3 ng/mL (ref 2.0–20.0)

## 2019-05-02 ENCOUNTER — Other Ambulatory Visit (HOSPITAL_COMMUNITY)
Admission: RE | Admit: 2019-05-02 | Discharge: 2019-05-02 | Disposition: A | Discharge status: Home / Self Care | Payer: Medicare Other | Source: Ambulatory Visit | Attending: Nephrology | Admitting: Nephrology

## 2019-05-02 DIAGNOSIS — Z940 Kidney transplant status: Secondary | ICD-10-CM | POA: Diagnosis not present

## 2019-05-02 DIAGNOSIS — Z789 Other specified health status: Secondary | ICD-10-CM | POA: Insufficient documentation

## 2019-05-02 DIAGNOSIS — E1122 Type 2 diabetes mellitus with diabetic chronic kidney disease: Secondary | ICD-10-CM | POA: Insufficient documentation

## 2019-05-02 DIAGNOSIS — E1129 Type 2 diabetes mellitus with other diabetic kidney complication: Secondary | ICD-10-CM | POA: Insufficient documentation

## 2019-05-02 DIAGNOSIS — N390 Urinary tract infection, site not specified: Secondary | ICD-10-CM | POA: Diagnosis not present

## 2019-05-02 DIAGNOSIS — B259 Cytomegaloviral disease, unspecified: Secondary | ICD-10-CM | POA: Diagnosis not present

## 2019-05-02 DIAGNOSIS — D631 Anemia in chronic kidney disease: Secondary | ICD-10-CM | POA: Diagnosis not present

## 2019-05-02 DIAGNOSIS — E559 Vitamin D deficiency, unspecified: Secondary | ICD-10-CM | POA: Diagnosis not present

## 2019-05-02 DIAGNOSIS — Z9483 Pancreas transplant status: Secondary | ICD-10-CM | POA: Insufficient documentation

## 2019-05-02 DIAGNOSIS — Z114 Encounter for screening for human immunodeficiency virus [HIV]: Secondary | ICD-10-CM | POA: Diagnosis not present

## 2019-05-02 DIAGNOSIS — D899 Disorder involving the immune mechanism, unspecified: Secondary | ICD-10-CM | POA: Insufficient documentation

## 2019-05-02 DIAGNOSIS — Z79899 Other long term (current) drug therapy: Secondary | ICD-10-CM | POA: Diagnosis not present

## 2019-05-02 LAB — BASIC METABOLIC PANEL
Anion gap: 11 (ref 5–15)
BLOOD UREA NITROGEN: 28 mg/dL — ABNORMAL HIGH
BUN: 28 mg/dL — ABNORMAL HIGH (ref 6–20)
CHLORIDE: 110 mmol/L
CO2: 18 mmol/L — ABNORMAL LOW (ref 22–32)
CO2: 18 mmol/L — ABNORMAL LOW
CREATININE: 2.86 mg/dL — ABNORMAL HIGH
Calcium: 10.8 mg/dL — ABNORMAL HIGH (ref 8.9–10.3)
Chloride: 110 mmol/L (ref 98–111)
Creatinine, Ser: 2.86 mg/dL — ABNORMAL HIGH (ref 0.61–1.24)
EGFR CKD-EPI AA MALE: 32 mL/min/{1.73_m2} — ABNORMAL LOW
GFR calc Af Amer: 32 mL/min — ABNORMAL LOW (ref >60)
GFR calc non Af Amer: 28 mL/min — ABNORMAL LOW (ref >60)
Glucose, Bld: 98 mg/dL (ref 70–99)
POTASSIUM: 5.8 mmol/L — ABNORMAL HIGH
Potassium: 5.8 mmol/L — ABNORMAL HIGH (ref 3.5–5.1)
SODIUM: 139 mmol/L
Sodium: 139 mmol/L (ref 135–145)

## 2019-05-02 LAB — TACROLIMUS, TROUGH: Lab: 14.3

## 2019-05-02 LAB — CBC W/ DIFFERENTIAL
BASOPHILS ABSOLUTE COUNT: 0 10*9/L
BASOPHILS RELATIVE PERCENT: 1 %
EOSINOPHILS ABSOLUTE COUNT: 0 10*9/L
EOSINOPHILS RELATIVE PERCENT: 1 %
HEMATOCRIT: 30.6 % — ABNORMAL LOW
HEMOGLOBIN: 9.3 g/dL — ABNORMAL LOW
LYMPHOCYTES ABSOLUTE COUNT: 0 10*9/L — ABNORMAL LOW
LYMPHOCYTES RELATIVE PERCENT: 0 %
MEAN CORPUSCULAR HEMOGLOBIN CONC: 30.4 g/dL
MEAN CORPUSCULAR HEMOGLOBIN: 25.6 pg — ABNORMAL LOW
MONOCYTES ABSOLUTE COUNT: 0.3 10*9/L
MONOCYTES RELATIVE PERCENT: 7 %
NEUTROPHILS ABSOLUTE COUNT: 3.6 10*9/L
NEUTROPHILS RELATIVE PERCENT: 91 %
PLATELET COUNT: 142 10*9/L — ABNORMAL LOW
RED BLOOD CELL COUNT: 3.63 10*12/L — ABNORMAL LOW
WHITE BLOOD CELL COUNT: 4 10*9/L

## 2019-05-02 LAB — RED BLOOD CELL COUNT: Lab: 3.63 — ABNORMAL LOW

## 2019-05-02 LAB — MAGNESIUM
Lab: 1.4 — ABNORMAL LOW
Magnesium: 1.4 mg/dL — ABNORMAL LOW (ref 1.7–2.4)

## 2019-05-02 LAB — CHLORIDE: Lab: 110

## 2019-05-02 LAB — PHOSPHORUS
Lab: 1.8 — ABNORMAL LOW
Phosphorus: 1.8 mg/dL — ABNORMAL LOW (ref 2.5–4.6)

## 2019-05-02 LAB — CBC WITH DIFFERENTIAL/PLATELET
Abs Immature Granulocytes: 0 10*3/uL (ref 0.00–0.07)
Basophils Absolute: 0 10*3/uL (ref 0.0–0.1)
Basophils Relative: 1 %
Eosinophils Absolute: 0 10*3/uL (ref 0.0–0.5)
Eosinophils Relative: 1 %
HCT: 30.6 % — ABNORMAL LOW (ref 39.0–52.0)
Hemoglobin: 9.3 g/dL — ABNORMAL LOW (ref 13.0–17.0)
Lymphocytes Relative: 0 %
Lymphs Abs: 0 10*3/uL — ABNORMAL LOW (ref 0.7–4.0)
MCH: 25.6 pg — ABNORMAL LOW (ref 26.0–34.0)
MCHC: 30.4 g/dL (ref 30.0–36.0)
MCV: 84.3 fL (ref 80.0–100.0)
Monocytes Absolute: 0.3 10*3/uL (ref 0.1–1.0)
Monocytes Relative: 7 %
Neutro Abs: 3.6 10*3/uL (ref 1.7–7.7)
Neutrophils Relative %: 91 %
Platelets: 142 10*3/uL — ABNORMAL LOW (ref 150–400)
RBC: 3.63 MIL/uL — ABNORMAL LOW (ref 4.22–5.81)
RDW: 17.3 % — ABNORMAL HIGH (ref 11.5–15.5)
WBC: 4 10*3/uL (ref 4.0–10.5)
nRBC: 0 % (ref 0.0–0.2)
nRBC: 0 /100 WBC

## 2019-05-02 NOTE — Unmapped (Deleted)
Transplant Nephrology Clinic Visit    History of Present Illness    33 y.o. male here for follow up after kidney transplantation.      Transplant History:  ??  Organ Received: Left DDT, DCD, KDPI: 36%; 16 hrs cold ischemia time  Native Kidney Disease: Alport's Syndrome  CPRA: 87  Date of Transplant: 02/20/19  Post-Transplant Course: DGF, last hemodialysis on 03/12/19--discontinued after increase in urine output with stable creatinine.  Prior Transplants: Yes  Induction: [place]  Date of Ureteral Stent Removal: 03/28/2019  Current Immunosuppression: Envarsus/Myfrotic  CMV/EBV Status: CMV D+/R+  EBV D+/R+  Valcyte 450 mg twice a week  Rejection Episodes: None  Donor Specific Antibodies: None  Results of Renal Imaging (pre and post):   Pre-Transplant 03/22/18  Small echogenic kidneys bilaterally, consistent with medical renal disease. 2 subcentimeter left renal cysts measuring up to 0.6 x 0.6 x 0.7 cm. No solid masses or calculi. No hydronephrosis  ??  Post-Transplant 02/20/19 (day of txp)  -Resistive indices in the left lower quadrant renal transplant arteries within normal limits.??  -Mildly increase in velocities noted within the main renal artery at the level of the anastomosis, likely postsurgical. Attention on follow-up.??  -Small perinephric fluid collection noted along the inferior pole transplant kidney measuring 2.3 x 3.2 x 0.8 cm as well as small amount of free fluid seen in the left upper quadrant, likely postoperative.      Current Immunosuppression Regimen:   Envarsus 20 mg daily  Myfotic 360 mg BID      Subjective/Interval: Since last visit patient had his ureteral stent removed. He received a dose of Aranesp on 03/29/19 and 04/08/19.  On 03/30/2019 patient's potassium elevated at 6, so patient prescribed 60 mg lasix once and 2 days of Kayexalate 30 mg.  Patient's potassium continued to stay elevated so he received 2 doses of Kayexalate on 04/04/19 and 04/06/2019. On 04/27/2019 patient's potassium again elevated at 6 and patient informed to increase lokela dose to 10mg  TID x 1 day and then resume 5 mg every other day. ***    Today patient presents ***      The patient reports shortness of breath with exertion walking about 100 feet. Some tightness with shortness of breath, but no chest pain, no chest heaviness.    Occasional metallic taste in mouth. Diarrhea, nausea, and vomiting--Myfortic was reduced to 540 mg BID on 10/1 but symptoms did not improve much. Vomits about twice per week. Diarrhea 3-5 times per day (loose, mushy). No fevers, no chills, no pain swallowing.   Cr trending down based on today's labs done at [facility] (see below).         03/14/19 Kidney biopsy showed diffuse subacute tubular injury, no evidence for rejection (C4d negative)    Past Medical History  1. Alport's Syndrome  2. Anemia of CKD  3. 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 Failed 11/24/2013  4. HTN  5. GERD    Review of Systems    Otherwise as per HPI, all other systems reviewed and are negative.    Medications  Current Outpatient Medications   Medication Sig Dispense Refill   ??? acetaminophen (TYLENOL) 500 MG tablet Take 1-2 tablets (500-1,000 mg total) by mouth every six (6) hours as needed for pain or fever (> 38C). 100 tablet 11   ??? amLODIPine (NORVASC) 5 MG tablet Take 2 tablets (10 mg total) by mouth daily. 60 tablet 11   ??? aspirin (ECOTRIN) 81 MG tablet HOLD until  directed to start in clinic. 30 tablet 11 ??? calcium acetate,phosphat bind, (PHOSLO) 667 mg capsule Take 2 capsules (1,334 mg total) by mouth Three (3) times a day with a meal. 180 capsule 11   ??? carvediloL (COREG) 6.25 MG tablet TAKE 1 TABLET (6.25 MG TOTAL) BY MOUTH TWO (2) TIMES A DAY. 180 tablet 3   ??? ergocalciferol (DRISDOL) 1,250 mcg (50,000 unit) capsule TAKE 1 CAPSULE BY MOUTH ONE TIME PER WEEK 8 capsule 0   ??? famotidine (PEPCID) 20 MG tablet Take 1 tablet (20 mg total) by mouth daily. 30 tablet 5   ??? furosemide (LASIX) 40 MG tablet Take 0.5 tablets (20 mg total) by mouth daily for 7 days. 30 tablet 11   ??? gabapentin (NEURONTIN) 100 MG capsule Take 1 capsule (100 mg total) by mouth nightly for 12 days. 12 capsule 0   ??? hydrOXYzine (ATARAX) 25 MG tablet Take 1 tablet (25 mg total) by mouth every eight (8) hours as needed for itching. 30 tablet 11   ??? loperamide (IMODIUM) 2 mg capsule Take 4 mg (2 tablets) at first onset of diarrhea then 2 mg (1 tablet) with additional diarrhea episodes as needed. Maximum 4 tablet per day. 60 capsule 11   ??? magnesium oxide-Mg AA chelate (MAGNESIUM, AMINO ACID CHELATE,) 133 mg Tab Take 1 tablet by mouth Two (2) times a day. HOLD until directed to start by your coordinator. 100 tablet 11   ??? mycophenolate (MYFORTIC) 180 MG EC tablet Take 2 tablets (360 mg total) by mouth Two (2) times a day. 120 tablet 11   ??? sodium bicarbonate 650 mg tablet Take 2 tablets (1,300 mg total) by mouth Two (2) times a day. 120 tablet 11   ??? sodium zirconium cyclosilicate (LOKELMA) 5 gram PwPk packet Mix 1 packet (5g) as directed and take by mouth every other day. 15 each 5   ??? tacrolimus (ENVARSUS XR) 4 mg Tb24 extended release tablet Take 20 mg by mouth daily. 450 tablet 3   ??? valGANciclovir (VALCYTE) 450 mg tablet Take 1 tablet (450 mg total) by mouth Every Monday and Thursday. 30 tablet 2   ??? zolpidem (AMBIEN) 10 mg tablet Take 10 mg by mouth nightly as needed for sleep. No current facility-administered medications for this visit.            Physical Exam  There were no vitals taken for this visit.  General: no acute distress  HEENT: mucous membranes moist, PERRL  Neck: neck supple, no cervical lymphadenopathy appreciated  CV: normal rate, normal rhythm, no murmur, no gallops, no rubs appreciated  Lungs: clear to auscultation bilaterally  Abdomen: soft, non tender  Extremities: no edema  Musculoskeletal: no visible deformity, normal range of motion.  Pulses: intact distally throughout  Neurologic: awake, alert, and oriented x3, no asterixis      Laboratory Data and Imaging reviewed in EPIC  No results found for this or any previous visit (from the past 48 hour(s)).        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       Recommendations/Plan:     Allograft Function, s/p DDKT: stable/improving, has not required hemodialysis since 9/26. He was discharged from his outpatient dialysis unit by Dr. Arlyn Leak.  Renal function stable*** at *** with baseline of approximately 2.99-3.2 since transplant.  No DSA's at this time.***  Add sodium bicarb 650 mg BID*** today for metabolic acidosis. Weight is decreasing, volume status good. I do not believe his GI symptoms are uremia. There are no apparent indications for dialysis at this time, will continue to monitor closely. ***    Anemia due to chronic kidney disease:  Hgb/Hct ***/*** today.  Last dose of Aranesp 04/08/2019.  Last Iron panel 03/25/2019.        I suspect this is contributing to his on-going fatigue. Will assess iron studies today and provide supplementation if required. If iron studies are normal, will arrange for ESA dosing. *** Immunosuppression Management [High Risk Medical Decision Making For Drug Therapy Requiring Intensive Monitoring For Toxicity]: Tacrolimus trough level *** today. Envarsus 20mg  daily ***.  Targeting tacrolimus trough levels of approximately 8-10 ng/mL.   Continue mycophenolate 360 mg BID.     trough level has ranged 8-11 ng/mL at this dose of envarsus, will continue current dose. Reduce Myfortic to 360 mg BID due to GI symptoms and monitor. ***    Electrolytes. Electrolyte and metabolic parameters in acceptable range. CO2 *** today. Potassium *** today.  Will continue to monitor ***    Blood Pressure Management: BP *** at this visit. Continue current regimen as BP is ***.     Lipid Management: Last lipid panel on ***.  Patient is currently ***.  Current ASCVD risk is ***    Infectious Prophylaxis and Monitoring: CMV VL not detected on 04/27/2019.  No decoys noted 03/25/2019.   The patient continues on *** and *** prophylaxis.       Health Maintenance:  See Dermatology yearly    Immunizations  Flu Shot: ***        Counseling:  I counseled the patient on:  The need to avoid sun exposure and the use of sunblock while outdoors given the relatively higher risk of skin malignancy in an immunosuppressed state.  The need for adherence to immunosuppression medication.  Patient verbalized understanding.     Follow-Up:  Return to clinic in *** weeks  Patient will continue to follow-up with his primary care provider for non-transplant related issues and medication refills. We have ordered transplant specific labs per the center's guidelines to monitor and assess for toxicities from immunosuppressant drug therapy

## 2019-05-03 DIAGNOSIS — Z940 Kidney transplant status: Principal | ICD-10-CM

## 2019-05-03 DIAGNOSIS — Z0000 Encounter for general adult medical examination without abnormal findings: Principal | ICD-10-CM

## 2019-05-03 DIAGNOSIS — E559 Vitamin D deficiency, unspecified: Principal | ICD-10-CM

## 2019-05-03 LAB — TACROLIMUS LEVEL: Tacrolimus (FK506) - LabCorp: 7.7 ng/mL (ref 2.0–20.0)

## 2019-05-03 MED ORDER — ATOVAQUONE 750 MG/5 ML ORAL SUSPENSION
Freq: Every day | ORAL | 2 refills | 30 days | Status: CP
Start: 2019-05-03 — End: 2019-06-02

## 2019-05-04 ENCOUNTER — Other Ambulatory Visit (HOSPITAL_COMMUNITY)
Admission: RE | Admit: 2019-05-04 | Discharge: 2019-05-04 | Disposition: A | Discharge status: Home / Self Care | Payer: Medicare Other | Source: Ambulatory Visit | Attending: Nephrology | Admitting: Nephrology

## 2019-05-04 DIAGNOSIS — D631 Anemia in chronic kidney disease: Secondary | ICD-10-CM | POA: Diagnosis not present

## 2019-05-04 DIAGNOSIS — Z79899 Other long term (current) drug therapy: Secondary | ICD-10-CM | POA: Diagnosis not present

## 2019-05-04 DIAGNOSIS — Z940 Kidney transplant status: Secondary | ICD-10-CM | POA: Insufficient documentation

## 2019-05-04 DIAGNOSIS — D899 Disorder involving the immune mechanism, unspecified: Secondary | ICD-10-CM | POA: Insufficient documentation

## 2019-05-04 DIAGNOSIS — Z114 Encounter for screening for human immunodeficiency virus [HIV]: Secondary | ICD-10-CM | POA: Insufficient documentation

## 2019-05-04 DIAGNOSIS — E559 Vitamin D deficiency, unspecified: Secondary | ICD-10-CM | POA: Diagnosis not present

## 2019-05-04 DIAGNOSIS — Z9483 Pancreas transplant status: Secondary | ICD-10-CM | POA: Insufficient documentation

## 2019-05-04 DIAGNOSIS — N390 Urinary tract infection, site not specified: Secondary | ICD-10-CM | POA: Insufficient documentation

## 2019-05-04 DIAGNOSIS — N189 Chronic kidney disease, unspecified: Secondary | ICD-10-CM | POA: Diagnosis not present

## 2019-05-04 DIAGNOSIS — B259 Cytomegaloviral disease, unspecified: Secondary | ICD-10-CM | POA: Diagnosis not present

## 2019-05-04 DIAGNOSIS — T8610 Unspecified complication of kidney transplant: Secondary | ICD-10-CM | POA: Insufficient documentation

## 2019-05-04 DIAGNOSIS — Z789 Other specified health status: Secondary | ICD-10-CM | POA: Diagnosis not present

## 2019-05-04 DIAGNOSIS — E1129 Type 2 diabetes mellitus with other diabetic kidney complication: Secondary | ICD-10-CM | POA: Insufficient documentation

## 2019-05-04 DIAGNOSIS — Z09 Encounter for follow-up examination after completed treatment for conditions other than malignant neoplasm: Secondary | ICD-10-CM | POA: Diagnosis not present

## 2019-05-04 LAB — CBC W/ DIFFERENTIAL
BASOPHILS ABSOLUTE COUNT: 0 10*9/L
BASOPHILS RELATIVE PERCENT: 0 %
EOSINOPHILS ABSOLUTE COUNT: 0.1 10*9/L
EOSINOPHILS RELATIVE PERCENT: 4 %
HEMATOCRIT: 30 % — ABNORMAL LOW
LYMPHOCYTES ABSOLUTE COUNT: 0 10*9/L — ABNORMAL LOW
LYMPHOCYTES RELATIVE PERCENT: 1 %
MEAN CORPUSCULAR HEMOGLOBIN: 25.9 pg — ABNORMAL LOW
MEAN CORPUSCULAR VOLUME: 83.6 fL
MONOCYTES ABSOLUTE COUNT: 0.1 10*9/L
MONOCYTES RELATIVE PERCENT: 2 %
NEUTROPHILS ABSOLUTE COUNT: 2.6 10*9/L
NEUTROPHILS RELATIVE PERCENT: 92 %
PLATELET COUNT: 136 10*9/L — ABNORMAL LOW
RED BLOOD CELL COUNT: 3.59 10*12/L — ABNORMAL LOW
RED CELL DISTRIBUTION WIDTH: 17.2 % — ABNORMAL HIGH

## 2019-05-04 LAB — BASIC METABOLIC PANEL
Anion gap: 7 (ref 5–15)
BLOOD UREA NITROGEN: 22 mg/dL — ABNORMAL HIGH
BUN: 22 mg/dL — ABNORMAL HIGH (ref 6–20)
CALCIUM: 10.5 mg/dL — ABNORMAL HIGH
CO2: 21 mmol/L — ABNORMAL LOW (ref 22–32)
CREATININE: 2.66 mg/dL — ABNORMAL HIGH
Calcium: 10.5 mg/dL — ABNORMAL HIGH (ref 8.9–10.3)
Chloride: 112 mmol/L — ABNORMAL HIGH (ref 98–111)
Creatinine, Ser: 2.66 mg/dL — ABNORMAL HIGH (ref 0.61–1.24)
GFR calc Af Amer: 35 mL/min — ABNORMAL LOW (ref >60)
GFR calc non Af Amer: 30 mL/min — ABNORMAL LOW (ref >60)
GLUCOSE RANDOM: 119 mg/dL — ABNORMAL HIGH
Glucose, Bld: 119 mg/dL — ABNORMAL HIGH (ref 70–99)
POTASSIUM: 4.8 mmol/L
Potassium: 4.8 mmol/L (ref 3.5–5.1)
SODIUM: 140 mmol/L
Sodium: 140 mmol/L (ref 135–145)

## 2019-05-04 LAB — TACROLIMUS, TROUGH: Lab: 7.7

## 2019-05-04 LAB — PHOSPHORUS
Lab: 1.7 — ABNORMAL LOW
Phosphorus: 1.7 mg/dL — ABNORMAL LOW (ref 2.5–4.6)

## 2019-05-04 LAB — HYPERCHROMASIA: Lab: 0

## 2019-05-04 LAB — MAGNESIUM
Lab: 1.3 — ABNORMAL LOW
Magnesium: 1.3 mg/dL — ABNORMAL LOW (ref 1.7–2.4)

## 2019-05-04 LAB — SODIUM: Lab: 140

## 2019-05-04 LAB — CBC WITH DIFFERENTIAL/PLATELET
Abs Immature Granulocytes: 0 10*3/uL (ref 0.00–0.07)
Basophils Absolute: 0 10*3/uL (ref 0.0–0.1)
Basophils Relative: 0 %
Eosinophils Absolute: 0.1 10*3/uL (ref 0.0–0.5)
Eosinophils Relative: 4 %
HCT: 30 % — ABNORMAL LOW (ref 39.0–52.0)
Hemoglobin: 9.3 g/dL — ABNORMAL LOW (ref 13.0–17.0)
Lymphocytes Relative: 1 %
Lymphs Abs: 0 10*3/uL — ABNORMAL LOW (ref 0.7–4.0)
MCH: 25.9 pg — ABNORMAL LOW (ref 26.0–34.0)
MCHC: 31 g/dL (ref 30.0–36.0)
MCV: 83.6 fL (ref 80.0–100.0)
Monocytes Absolute: 0.1 10*3/uL (ref 0.1–1.0)
Monocytes Relative: 2 %
Myelocytes: 1 %
Neutro Abs: 2.6 10*3/uL (ref 1.7–7.7)
Neutrophils Relative %: 92 %
Platelets: 136 10*3/uL — ABNORMAL LOW (ref 150–400)
RBC: 3.59 MIL/uL — ABNORMAL LOW (ref 4.22–5.81)
RDW: 17.2 % — ABNORMAL HIGH (ref 11.5–15.5)
WBC: 2.8 10*3/uL — ABNORMAL LOW (ref 4.0–10.5)
nRBC: 0 % (ref 0.0–0.2)
nRBC: 0 /100 WBC

## 2019-05-06 ENCOUNTER — Other Ambulatory Visit (HOSPITAL_COMMUNITY)
Admission: RE | Admit: 2019-05-06 | Discharge: 2019-05-06 | Disposition: A | Discharge status: Home / Self Care | Payer: Medicare Other | Source: Ambulatory Visit | Attending: Nephrology | Admitting: Nephrology

## 2019-05-06 DIAGNOSIS — Z9483 Pancreas transplant status: Secondary | ICD-10-CM | POA: Insufficient documentation

## 2019-05-06 DIAGNOSIS — Z789 Other specified health status: Secondary | ICD-10-CM | POA: Insufficient documentation

## 2019-05-06 DIAGNOSIS — E1129 Type 2 diabetes mellitus with other diabetic kidney complication: Secondary | ICD-10-CM | POA: Diagnosis not present

## 2019-05-06 DIAGNOSIS — Z940 Kidney transplant status: Secondary | ICD-10-CM | POA: Diagnosis not present

## 2019-05-06 DIAGNOSIS — N189 Chronic kidney disease, unspecified: Secondary | ICD-10-CM | POA: Insufficient documentation

## 2019-05-06 DIAGNOSIS — D631 Anemia in chronic kidney disease: Secondary | ICD-10-CM | POA: Diagnosis not present

## 2019-05-06 DIAGNOSIS — D899 Disorder involving the immune mechanism, unspecified: Secondary | ICD-10-CM | POA: Diagnosis not present

## 2019-05-06 DIAGNOSIS — N390 Urinary tract infection, site not specified: Secondary | ICD-10-CM | POA: Diagnosis not present

## 2019-05-06 DIAGNOSIS — E559 Vitamin D deficiency, unspecified: Secondary | ICD-10-CM | POA: Diagnosis not present

## 2019-05-06 DIAGNOSIS — Z79899 Other long term (current) drug therapy: Secondary | ICD-10-CM | POA: Insufficient documentation

## 2019-05-06 DIAGNOSIS — Z114 Encounter for screening for human immunodeficiency virus [HIV]: Secondary | ICD-10-CM | POA: Diagnosis not present

## 2019-05-06 DIAGNOSIS — E1122 Type 2 diabetes mellitus with diabetic chronic kidney disease: Secondary | ICD-10-CM | POA: Diagnosis not present

## 2019-05-06 DIAGNOSIS — B259 Cytomegaloviral disease, unspecified: Secondary | ICD-10-CM | POA: Diagnosis not present

## 2019-05-06 LAB — TACROLIMUS, TROUGH: Lab: 7.5

## 2019-05-06 LAB — CBC WITH DIFFERENTIAL/PLATELET
Abs Immature Granulocytes: 0 10*3/uL (ref 0.00–0.07)
Basophils Absolute: 0 10*3/uL (ref 0.0–0.1)
Basophils Relative: 0 %
Eosinophils Absolute: 0.1 10*3/uL (ref 0.0–0.5)
Eosinophils Relative: 3 %
HCT: 30.9 % — ABNORMAL LOW (ref 39.0–52.0)
Hemoglobin: 9.7 g/dL — ABNORMAL LOW (ref 13.0–17.0)
Lymphocytes Relative: 1 %
Lymphs Abs: 0 10*3/uL — ABNORMAL LOW (ref 0.7–4.0)
MCH: 25.9 pg — ABNORMAL LOW (ref 26.0–34.0)
MCHC: 31.4 g/dL (ref 30.0–36.0)
MCV: 82.6 fL (ref 80.0–100.0)
Monocytes Absolute: 0.2 10*3/uL (ref 0.1–1.0)
Monocytes Relative: 4 %
Neutro Abs: 3.6 10*3/uL (ref 1.7–7.7)
Neutrophils Relative %: 92 %
Platelets: 132 10*3/uL — ABNORMAL LOW (ref 150–400)
RBC: 3.74 MIL/uL — ABNORMAL LOW (ref 4.22–5.81)
RDW: 17.1 % — ABNORMAL HIGH (ref 11.5–15.5)
WBC: 3.9 10*3/uL — ABNORMAL LOW (ref 4.0–10.5)
nRBC: 0 % (ref 0.0–0.2)
nRBC: 0 /100 WBC

## 2019-05-06 LAB — BASIC METABOLIC PANEL
Anion gap: 8 (ref 5–15)
BUN: 25 mg/dL — ABNORMAL HIGH (ref 6–20)
CO2: 20 mmol/L — ABNORMAL LOW (ref 22–32)
Calcium: 10.8 mg/dL — ABNORMAL HIGH (ref 8.9–10.3)
Chloride: 111 mmol/L (ref 98–111)
Creatinine, Ser: 2.77 mg/dL — ABNORMAL HIGH (ref 0.61–1.24)
GFR calc Af Amer: 33 mL/min — ABNORMAL LOW (ref >60)
GFR calc non Af Amer: 29 mL/min — ABNORMAL LOW (ref >60)
Glucose, Bld: 102 mg/dL — ABNORMAL HIGH (ref 70–99)
Potassium: 6.1 mmol/L — ABNORMAL HIGH (ref 3.5–5.1)
Sodium: 139 mmol/L (ref 135–145)

## 2019-05-06 LAB — PHOSPHORUS: Phosphorus: 1.5 mg/dL — ABNORMAL LOW (ref 2.5–4.6)

## 2019-05-06 LAB — TACROLIMUS LEVEL: Tacrolimus (FK506) - LabCorp: 7.5 ng/mL (ref 2.0–20.0)

## 2019-05-06 LAB — MAGNESIUM: Magnesium: 1.3 mg/dL — ABNORMAL LOW (ref 1.7–2.4)

## 2019-05-08 LAB — TACROLIMUS LEVEL: Tacrolimus (FK506) - LabCorp: 5.3 ng/mL (ref 2.0–20.0)

## 2019-05-09 LAB — PHOSPHORUS: Lab: 1.5 — ABNORMAL LOW

## 2019-05-09 LAB — CBC W/ DIFFERENTIAL
BASOPHILS ABSOLUTE COUNT: 0 10*9/L
BASOPHILS RELATIVE PERCENT: 0 %
EOSINOPHILS ABSOLUTE COUNT: 0.1 10*9/L
EOSINOPHILS RELATIVE PERCENT: 3 %
HEMATOCRIT: 30.9 % — ABNORMAL LOW
HEMOGLOBIN: 9.7 g/dL — ABNORMAL LOW
LYMPHOCYTES ABSOLUTE COUNT: 0 10*9/L — ABNORMAL LOW
LYMPHOCYTES RELATIVE PERCENT: 1 %
MEAN CORPUSCULAR HEMOGLOBIN CONC: 31.4 g/dL
MEAN CORPUSCULAR HEMOGLOBIN: 25.9 pg — ABNORMAL LOW
MEAN CORPUSCULAR VOLUME: 82.6 fL
MONOCYTES ABSOLUTE COUNT: 0.2 10*9/L
NEUTROPHILS ABSOLUTE COUNT: 3.6 10*9/L
NEUTROPHILS RELATIVE PERCENT: 92 %
PLATELET COUNT: 132 10*9/L — ABNORMAL LOW
RED BLOOD CELL COUNT: 3.74 10*12/L — ABNORMAL LOW
WBC ADJUSTED: 3.9 10*9/L — ABNORMAL LOW

## 2019-05-09 LAB — SODIUM: Lab: 139

## 2019-05-09 LAB — BASIC METABOLIC PANEL
BLOOD UREA NITROGEN: 25 mg/dL — ABNORMAL HIGH
CHLORIDE: 111 mmol/L
CO2: 20 mmol/L — ABNORMAL LOW
CREATININE: 2.77 mg/dL — ABNORMAL HIGH
EGFR CKD-EPI AA MALE: 33 mL/min/{1.73_m2} — ABNORMAL LOW
GLUCOSE RANDOM: 102 mg/dL — ABNORMAL HIGH
POTASSIUM: 6.1 mmol/L — ABNORMAL HIGH
SODIUM: 139 mmol/L

## 2019-05-09 LAB — TACROLIMUS, TROUGH: Lab: 5.3

## 2019-05-09 LAB — MAGNESIUM: Lab: 1.3 — ABNORMAL LOW

## 2019-05-09 LAB — HEMOGLOBIN: Lab: 9.7 — ABNORMAL LOW

## 2019-05-09 NOTE — Unmapped (Signed)
K+6.1, advised to take Lokelma 10 mg TID

## 2019-05-10 ENCOUNTER — Ambulatory Visit: Admit: 2019-05-10 | Discharge: 2019-05-11 | Payer: MEDICARE

## 2019-05-10 ENCOUNTER — Encounter: Admit: 2019-05-10 | Discharge: 2019-05-10 | Payer: MEDICARE | Attending: Nephrology | Primary: Nephrology

## 2019-05-10 ENCOUNTER — Ambulatory Visit: Admit: 2019-05-10 | Discharge: 2019-05-11 | Payer: MEDICARE | Attending: Family | Primary: Family

## 2019-05-10 DIAGNOSIS — Z940 Kidney transplant status: Principal | ICD-10-CM

## 2019-05-10 DIAGNOSIS — N281 Cyst of kidney, acquired: Secondary | ICD-10-CM | POA: Diagnosis not present

## 2019-05-10 DIAGNOSIS — N2581 Secondary hyperparathyroidism of renal origin: Secondary | ICD-10-CM | POA: Diagnosis not present

## 2019-05-10 DIAGNOSIS — Z87891 Personal history of nicotine dependence: Secondary | ICD-10-CM | POA: Diagnosis not present

## 2019-05-10 DIAGNOSIS — Q8781 Alport syndrome: Secondary | ICD-10-CM | POA: Diagnosis not present

## 2019-05-10 DIAGNOSIS — Z20828 Contact with and (suspected) exposure to other viral communicable diseases: Secondary | ICD-10-CM | POA: Diagnosis not present

## 2019-05-10 DIAGNOSIS — N186 End stage renal disease: Secondary | ICD-10-CM | POA: Diagnosis not present

## 2019-05-10 DIAGNOSIS — D849 Immunodeficiency, unspecified: Secondary | ICD-10-CM | POA: Diagnosis not present

## 2019-05-10 DIAGNOSIS — I120 Hypertensive chronic kidney disease with stage 5 chronic kidney disease or end stage renal disease: Secondary | ICD-10-CM | POA: Diagnosis not present

## 2019-05-10 DIAGNOSIS — I10 Essential (primary) hypertension: Secondary | ICD-10-CM | POA: Diagnosis not present

## 2019-05-10 DIAGNOSIS — Z7982 Long term (current) use of aspirin: Secondary | ICD-10-CM | POA: Diagnosis not present

## 2019-05-10 DIAGNOSIS — K219 Gastro-esophageal reflux disease without esophagitis: Secondary | ICD-10-CM | POA: Diagnosis not present

## 2019-05-10 LAB — CBC W/ AUTO DIFF
BASOPHILS ABSOLUTE COUNT: 0 10*9/L (ref 0.0–0.1)
BASOPHILS RELATIVE PERCENT: 0.4 %
EOSINOPHILS ABSOLUTE COUNT: 0.1 10*9/L (ref 0.0–0.4)
EOSINOPHILS RELATIVE PERCENT: 3.1 %
HEMATOCRIT: 35.1 % — ABNORMAL LOW (ref 41.0–53.0)
HEMOGLOBIN: 10.5 g/dL — ABNORMAL LOW (ref 13.5–17.5)
LARGE UNSTAINED CELLS: 1 % (ref 0–4)
LYMPHOCYTES ABSOLUTE COUNT: 0.1 10*9/L — ABNORMAL LOW (ref 1.5–5.0)
LYMPHOCYTES RELATIVE PERCENT: 4 %
MEAN CORPUSCULAR HEMOGLOBIN: 25.4 pg — ABNORMAL LOW (ref 26.0–34.0)
MEAN PLATELET VOLUME: 10.1 fL — ABNORMAL HIGH (ref 7.0–10.0)
MONOCYTES ABSOLUTE COUNT: 0.1 10*9/L — ABNORMAL LOW (ref 0.2–0.8)
MONOCYTES RELATIVE PERCENT: 3.4 %
NEUTROPHILS ABSOLUTE COUNT: 3.1 10*9/L (ref 2.0–7.5)
NEUTROPHILS RELATIVE PERCENT: 88.1 %
PLATELET COUNT: 202 10*9/L (ref 150–440)
RED BLOOD CELL COUNT: 4.13 10*12/L — ABNORMAL LOW (ref 4.50–5.90)
RED CELL DISTRIBUTION WIDTH: 18.6 % — ABNORMAL HIGH (ref 12.0–15.0)
WBC ADJUSTED: 3.6 10*9/L — ABNORMAL LOW (ref 4.5–11.0)

## 2019-05-10 LAB — URINALYSIS
BACTERIA: NONE SEEN /HPF
BILIRUBIN UA: NEGATIVE
BLOOD UA: NEGATIVE
KETONES UA: NEGATIVE
LEUKOCYTE ESTERASE UA: NEGATIVE
NITRITE UA: NEGATIVE
PROTEIN UA: NEGATIVE
RBC UA: <1 /HPF (ref <=3)
SPECIFIC GRAVITY UA: 1.012 (ref 1.003–1.030)
SQUAMOUS EPITHELIAL: <1 /HPF (ref 0–5)
UROBILINOGEN UA: 0.2
WBC UA: 1 /HPF (ref <=2)

## 2019-05-10 LAB — COMPREHENSIVE METABOLIC PANEL
ALBUMIN: 5 g/dL (ref 3.5–5.0)
ALKALINE PHOSPHATASE: 180 U/L — ABNORMAL HIGH (ref 38–126)
ALT (SGPT): 8 U/L (ref <50)
ANION GAP: 13 mmol/L (ref 7–15)
AST (SGOT): 18 U/L — ABNORMAL LOW (ref 19–55)
BLOOD UREA NITROGEN: 23 mg/dL — ABNORMAL HIGH (ref 7–21)
BUN / CREAT RATIO: 9
CALCIUM: 11.9 mg/dL — ABNORMAL HIGH (ref 8.5–10.2)
CHLORIDE: 106 mmol/L (ref 98–107)
CO2: 21 mmol/L — ABNORMAL LOW (ref 22.0–30.0)
CREATININE: 2.51 mg/dL — ABNORMAL HIGH (ref 0.70–1.30)
EGFR CKD-EPI AA MALE: 37 mL/min/{1.73_m2} — ABNORMAL LOW (ref >=60)
EGFR CKD-EPI NON-AA MALE: 32 mL/min/{1.73_m2} — ABNORMAL LOW (ref >=60)
GLUCOSE RANDOM: 98 mg/dL (ref 70–99)
POTASSIUM: 5.5 mmol/L — ABNORMAL HIGH (ref 3.5–5.0)
PROTEIN TOTAL: 8.6 g/dL — ABNORMAL HIGH (ref 6.5–8.3)
SODIUM: 140 mmol/L (ref 135–145)

## 2019-05-10 LAB — MAGNESIUM: Magnesium:MCnc:Pt:Ser/Plas:Qn:: 1.4 — ABNORMAL LOW

## 2019-05-10 LAB — AST (SGOT): Aspartate aminotransferase:CCnc:Pt:Ser/Plas:Qn:: 18 — ABNORMAL LOW

## 2019-05-10 LAB — SLIDE REVIEW

## 2019-05-10 LAB — CALCIUM: Calcium:MCnc:Pt:Ser/Plas:Qn:: 11.9 — ABNORMAL HIGH

## 2019-05-10 LAB — RENAL FUNCTION PANEL
ALBUMIN: 4.9 g/dL (ref 3.5–5.0)
ANION GAP: 12 mmol/L (ref 7–15)
BLOOD UREA NITROGEN: 22 mg/dL — ABNORMAL HIGH (ref 7–21)
BUN / CREAT RATIO: 9
CHLORIDE: 106 mmol/L (ref 98–107)
CO2: 22 mmol/L (ref 22.0–30.0)
EGFR CKD-EPI AA MALE: 37 mL/min/{1.73_m2} — ABNORMAL LOW (ref >=60)
EGFR CKD-EPI NON-AA MALE: 32 mL/min/{1.73_m2} — ABNORMAL LOW (ref >=60)
GLUCOSE RANDOM: 97 mg/dL (ref 70–179)
PHOSPHORUS: 2.1 mg/dL — ABNORMAL LOW (ref 2.9–4.7)
POTASSIUM: 5.4 mmol/L — ABNORMAL HIGH (ref 3.5–5.0)
SODIUM: 140 mmol/L (ref 135–145)

## 2019-05-10 LAB — PROTEIN / CREATININE RATIO, URINE
PROTEIN URINE: 13.4 mg/dL
PROTEIN/CREAT RATIO, URINE: 0.109

## 2019-05-10 LAB — PHOSPHORUS: Phosphate:MCnc:Pt:Ser/Plas:Qn:: 2 — ABNORMAL LOW

## 2019-05-10 LAB — EGFR CKD-EPI NON-AA MALE: Lab: 32 — ABNORMAL LOW

## 2019-05-10 LAB — CREATININE, URINE: Lab: 123.3

## 2019-05-10 LAB — RED BLOOD CELL COUNT: Lab: 4.13 — ABNORMAL LOW

## 2019-05-10 LAB — SMEAR REVIEW

## 2019-05-10 LAB — PARATHYROID HOMONE (PTH): PARATHYROID HORMONE INTACT: 288.6 pg/mL — ABNORMAL HIGH (ref 12.0–72.0)

## 2019-05-10 LAB — SQUAMOUS EPITHELIAL: Lab: <1

## 2019-05-10 LAB — TACROLIMUS BLOOD: Lab: 6.5

## 2019-05-10 LAB — TACROLIMUS LEVEL: TACROLIMUS BLOOD: 6.5 ng/mL

## 2019-05-10 MED ORDER — CHLORTHALIDONE 25 MG TABLET
ORAL_TABLET | Freq: Every morning | ORAL | 11 refills | 30 days | Status: CP
Start: 2019-05-10 — End: 2020-05-09

## 2019-05-10 MED ORDER — MG-PLUS-PROTEIN 133 MG TABLET
ORAL_TABLET | Freq: Every day | ORAL | 11 refills | 0 days | Status: CP
Start: 2019-05-10 — End: ?
  Filled 2019-06-07: qty 30, 30d supply, fill #0

## 2019-05-10 MED ORDER — ATOVAQUONE 750 MG/5 ML ORAL SUSPENSION
Freq: Every day | ORAL | 2 refills | 30 days | Status: CP
Start: 2019-05-10 — End: 2019-06-09
  Filled 2019-05-10: qty 300, 30d supply, fill #0

## 2019-05-10 MED ORDER — NYSTATIN 100,000 UNIT/ML ORAL SUSPENSION
Freq: Four times a day (QID) | ORAL | 0 refills | 14.00000 days | Status: CP
Start: 2019-05-10 — End: 2019-05-24

## 2019-05-10 MED ORDER — LOKELMA 5 GRAM ORAL POWDER PACKET
Freq: Every day | ORAL | 5 refills | 30.00000 days | Status: CP
Start: 2019-05-10 — End: ?
  Filled 2019-05-11: qty 30, 30d supply, fill #0

## 2019-05-10 MED ORDER — VALGANCICLOVIR 450 MG TABLET
ORAL_TABLET | Freq: Every day | ORAL | 0 refills | 15 days | Status: CP
Start: 2019-05-10 — End: 2019-08-08
  Filled 2019-05-11: qty 15, 15d supply, fill #0

## 2019-05-10 MED FILL — ATOVAQUONE 750 MG/5 ML ORAL SUSPENSION: 30 days supply | Qty: 300 | Fill #0 | Status: AC

## 2019-05-10 NOTE — Unmapped (Signed)
Transplant Coordinator, Clinic Visit     Assessment  BP: home BP, been around low 130's-140s  BG: n/a  Headache: over weekend possible HA   Hand tremors: none  Numbness/tingling: none  Fevers: patient reports having on and off fevers over the weekend but did not page the on-call coordinator. Patient was educated by this covering tnc that he needs to call the on-call coordinator with any fever of 100.5 regardless that he felt good. He reported on and off fevers from 99.9-102, and reported taking Tylenol but failed to call on-call coordinator. Provider aware and sending patient for COVID testing.  Chills/sweats: none        Shortness of breath: none  Chest pain or pressure: none  Palpitations: none   Nausea/vomiting: denies  Diarrhea/constipation: denies  UTI symptoms: denies  Swelling: denies  Sleep: reports sleeping ok, but does sleep late in the morning from being up late at night.   Pain: none  Incision: healed  Intake: patient reports drinking 3 bottles of water daily, TNC to encourage more PO fluid intake up to 70-100 oz.   Output: reports voiding 8-10 times daily  Any new medications? None and patient confirms getting his medications with no issues.     Immunosuppressant last taken: took his late dose at 11 am on 11-23    Immunization status: will get booster flu shot in clinic today     Functional Score: 90     Able to carry on normal activity;  Minor signs or symptoms of disease.  Employment status is: works full time when able to go back    Provider would like to add DSA's to today labs, added by Engineer, maintenance, alone with G6PD  Anti-GBM to be added to next labs at future date.  Patient needs outpatient follow up chest xray.   Medications updated by pharmacy and new medication card provided.   Patient to RTC with Dr. Aldona Bar in 4 weeks at [facility]    I spent a total of 5 minutes with Russell Wade reviewing medications and symptoms.

## 2019-05-10 NOTE — Unmapped (Signed)
Assessment     Russell Wade is a 33 y.o. male presenting to [facility] for COVID testing.     Problem List Items Addressed This Visit     None      Visit Diagnoses     Contact with or exposure to viral disease              Plan     If no testing performed, pt counseled on routine care for respiratory illness.  If testing performed, COVID sent.  Patient directed to Home given findings during today's visit.    Subjective     Russell Wade is a 33 y.o. male who [facility] the [facility] with complaints of the following:    Exposure History: In the last 21 days?     Have you traveled outside of [place]? No               Have you been in close contact with someone confirmed by a test to have COVID? (Close contact is within 6 feet for at least 10 minutes) No       Have you worked in a health care facility? No     Lived or worked facility like a nursing home, group home, or assisted living?    No         Are you scheduled to have surgery or a procedure in the next 3 days? No               Are you scheduled to receive cancer chemotherapy within the next 7 days?    No     Have you ever been tested before for COVID-19 with a swab of your nose? Yes: When: 02/20/19, Where: [place] [facility]   Are you a healthcare worker being tested so to return to work No         Right now,  do you have any of the following that developed over the past 7 days (as stated by patient on intake form):    Subjective fever (felt feverish) Yes, how many days? 3   Chills (especially repeated shaking chills) No   Severe fatigue (felt very tired) No   Muscle aches No   Runny nose No   Sore throat No   Loss of taste or smell No   Cough (new onset or worsening of chronic cough) No   Shortness of breath No   Nausea or vomiting No   Headache Yes, how many days? 3   Abdominal Pain No   Diarrhea (3 or more loose stools in last 24 hours) No History/Medical Conditions (as stated by patient on intake form):    Do you have any of the following:   Asthma or emphysema or COPD No   Cystic Fibrosis No   Diabetes No   High Blood Pressure  Yes   Cardiovascular Disease No   Chronic Kidney Disease Yes   Chronic Liver Disease No   Chronic blood disorder like Sickle Cell Disease  No   Weak immune system due to disease or medication Yes   Neurologic condition that limits movement  No   Developmental delay - Moderate to Severe  No   Recent (within past 2 weeks) or current Pregnancy No   Morbid Obesity (>100 pounds over ideal weight) No   Current Smoker No   Former Smoker Yes       Objective     Given above, testing performed: Yes  Testing Performed:  Test Specimen Type Sent to   COVID-19  NP Swab [facility]       Scribe's Attestation: Aida Puffer, FNP, obtained and performed the history, physical exam and medical decision making elements that were entered into the chart.  Signed by Victorino Sparrow serving as Scribe, on 05/10/2019 3:12 PM      The documentation recorded by the scribe accurately reflects the service I personally performed and the decisions made by me. Aida Puffer, FNP  May 11, 2019 9:51 AM

## 2019-05-10 NOTE — Unmapped (Signed)
Urine was collected and sent to the lab.

## 2019-05-10 NOTE — Unmapped (Signed)
[place] lab called with a Ca of 12.1.  Routed to primary TNC

## 2019-05-10 NOTE — Unmapped (Signed)
Patient was called to schedule [facility] Appointment for <DATE>    Did patient confirm appointment? Yes    05/10/2019 Status: Sch   Time: 2:30 PM ?? ??  Arrive By: ?? ?? ??  Length: 30 Copay: $0.00  Provider: [facility] PROVIDER Department: [facility] [place]  Visit Type: COVID EXAM [facility]       Patient aware of appt and location    ===View-only below this line===  ----- Message -----  From: Drue Flirt, RN  Sent: 05/10/2019   1:37 PM EST  To: Covid Rdc Schedulers  Subject: testing                                          Hello.  Can you please schedule a kidney transplant patient who is currently here in [facility] @ hospital    Please order 2 View Chest Xray and Covid test d/t recent fevers of 102      Ps is awaiting on instruction on what to do.     Thank You,   Rolly Salter RN, BSN  Transplant Coordinator   Phone: [phone]

## 2019-05-10 NOTE — Unmapped (Signed)
[facility] Specialty Medication Onboarding    Specialty Medication: Atovaquone  Prior Authorization: Not Required   Financial Assistance: No - copay  <$25  Final Copay/Day Supply: 0 / 30    Insurance Restrictions: None     Notes to Pharmacist: N/A    The triage team has completed the benefits investigation and has determined that the patient is able to fill this medication at [facility]. Please contact the patient to complete the onboarding or follow up with the prescribing physician as needed.

## 2019-05-10 NOTE — Unmapped (Signed)
[facility]   Patient Onboarding/Medication Counseling    [name] is a 33 y.o. male with a kidney transplant who I am counseling today on initiation of therapy.  I am speaking to the patient.    Verified patient's date of birth / HIPAA.    Specialty medication(s) to be sent: Transplant: Atovaquone 750mg /ml      Non-specialty medications/supplies to be sent: patient declined any specialty or non specialty medications today.      Medications not needed at this time: none            Mepron (atovaquone)    Medication & Administration     Dosage: Take 83ml's daily.    Administration:   ? Shake gently before use  ? Take with food  ? Measure liquid carefully using measuring device provided with drug    Adherence/Missed dose instructions:  ? Take a missed dose as soon as you think about it  ? If it is close to the time for next dose, skip missed dose and resume normal schedule  ? Do not take 2 doses at the same time or extra doses    Goals of Therapy     ? To prevent Pneumocystis jirovecii pneumonia (PCP)    Side Effects & Monitoring Parameters     ? Common side effects  ? Headache  ? Nausea, vomiting, diarrhea, abdominal pain  ? Skin rash  ? Trouble sleeping  ? Muscle pain or ache  ? Flu-like symptoms- Stuffy or runny nose, cough, fever    ? The following side effects should be reported to the provider:  ? Allergic reaction  ? Signs of infection  ? Depression  ? Thrush  ? Dark urine, fatigue, lack of appetite, abdominal pain, light colored stool, vomiting, yellow skin/eyes    ? Monitoring Parameters  ? Hepatic function tests     Contraindications, Warnings, & Precautions     ? Use caution in patients with severe hepatic impairment  ? Use caution in elderly patients    Drug/Food Interactions     ? Medication list reviewed in Epic. The patient was instructed to inform the care team before taking any new medications or supplements. No drug interactions identified. Storage, Handling Precautions, & Disposal     ? Store at room temperature in dry location  ? Keep away from children and pets      Current Medications (including OTC/herbals), Comorbidities and Allergies     Current Outpatient Medications   Medication Sig Dispense Refill   ??? acetaminophen (TYLENOL) 500 MG tablet Take 1-2 tablets (500-1,000 mg total) by mouth every six (6) hours as needed for pain or fever (> 38C). 100 tablet 11   ??? amLODIPine (NORVASC) 5 MG tablet Take 2 tablets (10 mg total) by mouth daily. 60 tablet 11   ??? aspirin (ECOTRIN) 81 MG tablet HOLD until directed to start in clinic. 30 tablet 11   ??? atovaquone (MEPRON) 750 mg/5 mL suspension Take 10 mL (1,500 mg total) by mouth daily. 300 mL 2   ??? carvediloL (COREG) 6.25 MG tablet TAKE 1 TABLET (6.25 MG TOTAL) BY MOUTH TWO (2) TIMES A DAY. 180 tablet 3   ??? chlorthalidone (HYGROTON) 25 MG tablet Take 1 tablet (25 mg total) by mouth every morning. 30 tablet 11   ??? famotidine (PEPCID) 20 MG tablet Take 1 tablet (20 mg total) by mouth daily. 30 tablet 5   ??? hydrOXYzine (ATARAX) 25 MG tablet Take 1 tablet (25  mg total) by mouth every eight (8) hours as needed for itching. 30 tablet 11   ??? loperamide (IMODIUM) 2 mg capsule Take 4 mg (2 tablets) at first onset of diarrhea then 2 mg (1 tablet) with additional diarrhea episodes as needed. Maximum 4 tablet per day. 60 capsule 11   ??? magnesium oxide-Mg AA chelate (MAGNESIUM, AMINO ACID CHELATE,) 133 mg Tab Take 1 tablet by mouth daily. 30 tablet 11   ??? mycophenolate (MYFORTIC) 180 MG EC tablet Take 2 tablets (360 mg total) by mouth Two (2) times a day. 120 tablet 11   ??? nystatin (MYCOSTATIN) 100,000 unit/mL suspension Take 5 mL (500,000 Units total) by mouth Four (4) times a day for 14 days. 280 mL 0   ??? sodium bicarbonate 650 mg tablet Take 2 tablets (1,300 mg total) by mouth Two (2) times a day. 120 tablet 11 ??? sodium zirconium cyclosilicate (LOKELMA) 5 gram PwPk packet Take 1 packet (5 g total) by mouth daily. 30 each 5   ??? tacrolimus (ENVARSUS XR) 4 mg Tb24 extended release tablet Take 20 mg by mouth daily. 450 tablet 3   ??? valGANciclovir (VALCYTE) 450 mg tablet Take 1 tablet (450 mg total) by mouth daily. 15 tablet 0   ??? zolpidem (AMBIEN) 10 mg tablet Take 10 mg by mouth nightly as needed for sleep.       No current facility-administered medications for this visit.        Allergies   Allergen Reactions   ??? Tuberculin Ppd      Other reaction(s): Skin Rash       Patient Active Problem List   Diagnosis   ??? Complication of transplanted kidney   ??? Alport syndrome   ??? Esophageal reflux   ??? Essential (primary) hypertension   ??? H/O kidney transplant   ??? Pityriasis versicolor   ??? Acute renal failure (CMS-HCC)   ??? Anemia   ??? Transplant rejection   ??? Hyperkalemia   ??? Kidney transplant rejection   ??? Acute dyspnea   ??? ESRD on dialysis (CMS-HCC)   ??? Secondary hyperparathyroidism of renal origin (CMS-HCC)   ??? Kidney replaced by transplant       Reviewed and up to date in Epic.    Appropriateness of Therapy     Is medication and dose appropriate based on diagnosis? Yes    Baseline Quality of Life Assessment      How many days over the past month did your kidney transplant keep you from your normal activities? 0    Financial Information     Medication Assistance provided: None Required    Anticipated copay of $0 reviewed with patient. Verified delivery address.    Delivery Information     Scheduled delivery date: 05/10/2019    Expected start date: 05/11/2019    Medication will be delivered via UPS to the prescription address in [facility].  This shipment will not require a signature. Explained the services we provide at [facility] and that each month we would call to set up refills.  Stressed importance of returning phone calls so that we could ensure they receive their medications in time each month.  Informed patient that we should be setting up refills 7-10 days prior to when they will run out of medication.  A pharmacist will reach out to perform a clinical assessment periodically.  Informed patient that a welcome packet and a drug information handout will be sent.  Patient verbalized understanding of the above information as well as how to contact the pharmacy at [phone] option 4 with any questions/concerns.  The pharmacy is open Monday through Friday 8:30am-4:30pm.  A pharmacist is available 24/7 via pager to answer any clinical questions they may have.    Patient Specific Needs     ? Does the patient have any physical, cognitive, or cultural barriers? No    ? Patient prefers to have medications discussed with  Patient     ? Is the patient able to read and understand education materials at a high school level or above? No    ? Patient's primary language is  English     ? Is the patient high risk? Yes, patient taking a REMS drug     ? Does the patient require a Care Management Plan? No     ? Does the patient require physician intervention or other additional services (i.e. nutrition, smoking cessation, social work)? No      Tera Helper  [facility] Specialty Pharmacist

## 2019-05-10 NOTE — Unmapped (Signed)
Transplant Nephrology Clinic Visit    History of Present Illness    33 y.o. male here for follow up after kidney transplantation.      Transplant History:  ??  Organ Received: Left DDT, DCD, KDPI: 36%; 16 hrs cold ischemia time  Native Kidney Disease: Alport's Syndrome  CPRA: 87  Date of Transplant: 02/20/19  Post-Transplant Course: DGF, last hemodialysis on 03/12/19--discontinued after increase in urine output with stable creatinine.  Prior Transplants: Yes  Induction: [place]  Date of Ureteral Stent Removal: 03/28/2019  Current Immunosuppression: Envarsus/Myfrotic  CMV/EBV Status: CMV D+/R+  EBV D+/R+  Valcyte 450 mg twice a week  Rejection Episodes: None  Donor Specific Antibodies: None  Results of Renal Imaging (pre and post):   Pre-Transplant 03/22/18  Small echogenic kidneys bilaterally, consistent with medical renal disease. 2 subcentimeter left renal cysts measuring up to 0.6 x 0.6 x 0.7 cm. No solid masses or calculi. No hydronephrosis  ??  Post-Transplant 02/20/19 (day of txp)  -Resistive indices in the left lower quadrant renal transplant arteries within normal limits.??  -Mildly increase in velocities noted within the main renal artery at the level of the anastomosis, likely postsurgical. Attention on follow-up.??  -Small perinephric fluid collection noted along the inferior pole transplant kidney measuring 2.3 x 3.2 x 0.8 cm as well as small amount of free fluid seen in the left upper quadrant, likely postoperative.     03/14/19 Kidney biopsy showed diffuse subacute tubular injury, no evidence for rejection (C4d negative)    Current Immunosuppression Regimen:   Envarsus 20 mg daily  Myfotic 360 mg BID      Subjective/Interval: Patient biggest concern was running fever over weekend. He claims he had one reading up to 102 but without any associated symptoms like cough, runny nose myalgia taste smell alteration or diarrhea. No pain during urination no smell or blood in urine.  He has been taking tylenol up to 1 tab bid for fever and this subsided. Patient had a cold sore open up in the lip. He feels fever started right after this.  He also claimed to have some white flaking corner of the tongue. No dysphagia and only some pain with chewing. Patient will be moved on to Covid testing even if symptom wise risk much lower.     Since last visit patient had his ureteral stent removed. He received a dose of Aranesp on 03/29/19 and 04/08/19.  On 03/30/2019 patient's potassium elevated at 6, so patient prescribed 60 mg lasix once and 2 days of Kayexalate 30 mg.  Patient's potassium continued to stay elevated so he received 2 doses of Kayexalate on 04/04/19 and 04/06/2019. On 04/27/2019 patient's potassium again elevated at 6 and patient informed to increase lokela dose to 10mg  TID x 1 day and then resume 5 mg every other day. Still his potasium kept running high and he was asked to increase lokelma to every day from today       Past Medical History  1. Alport's Syndrome  2. Anemia of CKD  3. 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 Failed 11/24/2013  4. HTN  5. GERD    Review of Systems    Otherwise as per HPI, all other systems reviewed and are negative.    Medications  Current Outpatient Medications   Medication Sig Dispense Refill   ??? acetaminophen (TYLENOL) 500 MG tablet Take 1-2 tablets (500-1,000 mg total) by mouth every six (6) hours as needed for pain or fever (> 38C).  100 tablet 11   ??? amLODIPine (NORVASC) 5 MG tablet Take 2 tablets (10 mg total) by mouth daily. 60 tablet 11   ??? aspirin (ECOTRIN) 81 MG tablet HOLD until directed to start in clinic. 30 tablet 11 ??? atovaquone (MEPRON) 750 mg/5 mL suspension Take 10 mL (1,500 mg total) by mouth daily. 300 mL 2   ??? calcium acetate,phosphat bind, (PHOSLO) 667 mg capsule Take 2 capsules (1,334 mg total) by mouth Three (3) times a day with a meal. 180 capsule 11   ??? carvediloL (COREG) 6.25 MG tablet TAKE 1 TABLET (6.25 MG TOTAL) BY MOUTH TWO (2) TIMES A DAY. 180 tablet 3   ??? ergocalciferol (DRISDOL) 1,250 mcg (50,000 unit) capsule TAKE 1 CAPSULE BY MOUTH ONE TIME PER WEEK 8 capsule 0   ??? famotidine (PEPCID) 20 MG tablet Take 1 tablet (20 mg total) by mouth daily. 30 tablet 5   ??? furosemide (LASIX) 40 MG tablet Take 0.5 tablets (20 mg total) by mouth daily for 7 days. 30 tablet 11   ??? gabapentin (NEURONTIN) 100 MG capsule Take 1 capsule (100 mg total) by mouth nightly for 12 days. 12 capsule 0   ??? hydrOXYzine (ATARAX) 25 MG tablet Take 1 tablet (25 mg total) by mouth every eight (8) hours as needed for itching. 30 tablet 11   ??? loperamide (IMODIUM) 2 mg capsule Take 4 mg (2 tablets) at first onset of diarrhea then 2 mg (1 tablet) with additional diarrhea episodes as needed. Maximum 4 tablet per day. 60 capsule 11   ??? magnesium oxide-Mg AA chelate (MAGNESIUM, AMINO ACID CHELATE,) 133 mg Tab Take 1 tablet by mouth Two (2) times a day. HOLD until directed to start by your coordinator. 100 tablet 11   ??? mycophenolate (MYFORTIC) 180 MG EC tablet Take 2 tablets (360 mg total) by mouth Two (2) times a day. 120 tablet 11   ??? sodium bicarbonate 650 mg tablet Take 2 tablets (1,300 mg total) by mouth Two (2) times a day. 120 tablet 11   ??? sodium zirconium cyclosilicate (LOKELMA) 5 gram PwPk packet Mix 1 packet (5g) as directed and take by mouth every other day. 15 each 5   ??? tacrolimus (ENVARSUS XR) 4 mg Tb24 extended release tablet Take 20 mg by mouth daily. 450 tablet 3   ??? valGANciclovir (VALCYTE) 450 mg tablet Take 1 tablet (450 mg total) by mouth Every Monday and Thursday. 30 tablet 2 ??? zolpidem (AMBIEN) 10 mg tablet Take 10 mg by mouth nightly as needed for sleep.       No current facility-administered medications for this visit.            Physical Exam  BP 136/94  - Pulse 88  - Temp 36.2 ??C (97.2 ??F) (Tympanic)  - Ht 175.3 cm (5' 9)  - Wt 58.3 kg (128 lb 8 oz)  - BMI 18.98 kg/m??   General: no acute distress  HEENT: mucous membranes moist, lip with ulcer, minimal white flaking on side of tongue.   Neck: neck supple, no cervical lymphadenopathy appreciated  CV: normal rate, normal rhythm, no murmur, no gallops, no rubs appreciated  Lungs: clear to auscultation bilaterally  Abdomen: soft, non tender  Extremities: no edema  Musculoskeletal: no visible deformity, normal range of motion.  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       Recommendations/Plan:     Allograft Function, s/p DDKT: DGF  stable/improving, has not required hemodialysis since 9/26. He was discharged from his outpatient dialysis unit by Dr. Arlyn Leak.   Renal function holding stable 2.5 region / slow improvement / Bicarb continues to run low normal needing bicarb support. K also running high normal at 6.1 / on lokelma 5mg  q48hrs   Decoy cell/ BK  neg  Date:  03/25/19 / DSA neg  Date: 03/02/19  /  CMV: nge   Date: 04/27/19  / UPC: 0.3  Date: 03/25/19    / As of now patient cr slow improvement. Last biopsy end of sep 2020 was diffuse subacute tubular injury, no evidence for rejection. IF cr doesn't continue to improve consistently will need a repeat biopsy. As of now will check upc and DSA repeat again. 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 No resp or urinary symptoms. Time line corresponds with the cold sore on lip? Still will proceed with Covid testing, also adding urinalysis and culture. And CXR once covid test turns negative if fever or resp symptoms persists.      Anemia due to chronic kidney disease: Last hb 05/10/19 Hb improved to 10.5   Last dose of Aranesp 04/08/2019.  Last Iron panel 03/25/2019. > adequate iron stores     Immunosuppression Management [High Risk Medical Decision Making For Drug Therapy Requiring Intensive Monitoring For Toxicity]: Tacrolimus trough level 5.3 from 05/06/19 Currently taking Envarsus 20mg  daily  ( Last change decreased from 21mg  on 11/13)  Targeting tacrolimus trough levels of approximately 8-10 ng/mL.   Continue mycophenolate 360 mg BID.     BMM and Electrolytes. Electrolyte and metabolic parameters in acceptable range. CO2 22 today. On 1300 BID.  Potassium 5.4 today.(took loklema 10 tid yesterday)   Will continue to monitor.  PTH only 124 02/20/19  Patient phos was running 2.0 but was taking binders. Will ask to stop using phoslo which will address calcium and phos lvls. Magnesium running low normal and need po supplement once calcium normalizes. Calcium running 12.1 today but this was while on po phoslo daily.       Repeat labs on Friday, if Calcium trends up will need more aggressive treatment. Blood Pressure Management: BP running 140+ at home as per patient. Will add chlorthalidone 25 to help with htn and kaliuresis     Infectious Prophylaxis  CMV D+/R+  EBV D+/R+  The patient continues on valcyte 450 increase dose to valcyte 450 daily for prophylaxis as well as to see impact on the hsv sore. As of now continue with atovaquone for pjp, check G6PD lvl and continue to avoid bactrim since high K      Health Maintenance:    See Dermatology yearly    Immunizations  Flu Shot: done on 03/12/19 will need a booster dose which can be planned in susequent visit, since recent fever will avoid flu shot as of today.  Counseling:  I counseled the patient on:  The need to avoid sun exposure and the use of sunblock while outdoors given the relatively higher risk of skin malignancy in an immunosuppressed state.  The need for adherence to immunosuppression medication.  Patient verbalized understanding.     Follow-Up:  Return to clinic in 4 weeks  Patient will continue to follow-up with his primary care provider for non-transplant related issues and medication refills. We have ordered transplant specific labs per the center's guidelines to monitor and assess for toxicities from immunosuppressant drug therapy

## 2019-05-10 NOTE — Unmapped (Signed)
Labs ordered.

## 2019-05-10 NOTE — Unmapped (Signed)
Urine has been collected and sent to lab.  

## 2019-05-11 DIAGNOSIS — Z940 Kidney transplant status: Principal | ICD-10-CM

## 2019-05-11 MED ORDER — TACROLIMUS ER 4 MG TABLET,EXTENDED RELEASE 24 HR
ORAL_TABLET | Freq: Every day | ORAL | 3 refills | 90.00000 days | Status: CP
Start: 2019-05-11 — End: 2020-05-10
  Filled 2019-05-11: qty 150, 30d supply, fill #0

## 2019-05-11 MED FILL — AMLODIPINE 5 MG TABLET: ORAL | 30 days supply | Qty: 60 | Fill #1

## 2019-05-11 MED FILL — VALGANCICLOVIR 450 MG TABLET: 15 days supply | Qty: 15 | Fill #0 | Status: AC

## 2019-05-11 MED FILL — ACETAMINOPHEN 500 MG TABLET: 13 days supply | Qty: 100 | Fill #1 | Status: AC

## 2019-05-11 MED FILL — LOKELMA 5 GRAM ORAL POWDER PACKET: 30 days supply | Qty: 30 | Fill #0 | Status: AC

## 2019-05-11 MED FILL — FAMOTIDINE 20 MG TABLET: 30 days supply | Qty: 30 | Fill #0 | Status: AC

## 2019-05-11 MED FILL — SODIUM BICARBONATE 650 MG TABLET: ORAL | 30 days supply | Qty: 120 | Fill #1

## 2019-05-11 MED FILL — MYFORTIC 180 MG TABLET,DELAYED RELEASE: 30 days supply | Qty: 120 | Fill #0 | Status: AC

## 2019-05-11 MED FILL — AMLODIPINE 5 MG TABLET: 30 days supply | Qty: 60 | Fill #1 | Status: AC

## 2019-05-11 MED FILL — ACETAMINOPHEN 500 MG TABLET: ORAL | 13 days supply | Qty: 100 | Fill #1

## 2019-05-11 MED FILL — ENVARSUS XR 4 MG TABLET,EXTENDED RELEASE: 30 days supply | Qty: 150 | Fill #0 | Status: AC

## 2019-05-11 MED FILL — SODIUM BICARBONATE 650 MG TABLET: 30 days supply | Qty: 120 | Fill #1 | Status: AC

## 2019-05-11 NOTE — Unmapped (Signed)
[facility] TRANSPL[facility]INIC PHARMACY NOTE  05/10/2019   Russell Wade  [id number]    Medication changes today:   1. START Nystatin 500,000 units 4x daily x 14 days  2. START Chlorthalidone 25 mg daily  3. START Atovaquone 1,500 mg daily  4. START Magnesium w/AA 133mg  daily  5. INCREASE Valcyte to daily until 05/22/19  6. INCREASE Lokelma to daily  7. STOP PhosLo  8. STOP Vitamin D 50,000 units    Education/Adherence tools provided today:  1.provided additional education on immunosuppression and transplant related medications including reviewing indications of medications, dosing and side effects  2. provided updated medication list  3.  provided additional pill box education  4.  provided additional education on PJP    Follow up items:  1. goal of understanding indications and dosing of immunosuppression medications  2. Electrolyte abnormalities (K, Ca, Mg, Phos)  3. Resolution of thrush    Next visit with pharmacy in 1-2 weeks  ____________________________________________________________________    Russell Wade is a 33 y.o. male s/p deceased kidney transplant on 03-05-19 (Kidney), 11/12/2007 (Kidney) 2/2 Alport's syndrome. 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    Other PMH significant for parathyroidectomy with autotransplant  ??  Post op course complicated by: DGF requiring maintenance on HD. Last HD 9/28    Seen by pharmacy today for: medication management and pill box fill and adherence education; last seen by pharmacy 6 weeks ago     CC:  Patient complains of  thrush, mouth ulcers, & transient fever    Vitals:    05/10/19 1158   BP: 136/94   Pulse: 88   Temp: 36.2 ??C (97.2 ??F)       Allergies   Allergen Reactions   ??? Tuberculin Ppd      Other reaction(s): Skin Rash       All medications reviewed and updated.     Medication list includes revisions made during today???s encounter    Outpatient Encounter Medications as of 05/10/2019 Medication Sig Dispense Refill   ??? acetaminophen (TYLENOL) 500 MG tablet Take 1-2 tablets (500-1,000 mg total) by mouth every six (6) hours as needed for pain or fever (> 38C). 100 tablet 11   ??? amLODIPine (NORVASC) 5 MG tablet Take 2 tablets (10 mg total) by mouth daily. 60 tablet 11   ??? aspirin (ECOTRIN) 81 MG tablet HOLD until directed to start in clinic. 30 tablet 11   ??? atovaquone (MEPRON) 750 mg/5 mL suspension Take 10 mL (1,500 mg total) by mouth daily. 300 mL 2   ??? carvediloL (COREG) 6.25 MG tablet TAKE 1 TABLET (6.25 MG TOTAL) BY MOUTH TWO (2) TIMES A DAY. 180 tablet 3   ??? chlorthalidone (HYGROTON) 25 MG tablet Take 1 tablet (25 mg total) by mouth every morning. 30 tablet 11   ??? famotidine (PEPCID) 20 MG tablet Take 1 tablet (20 mg total) by mouth daily. 30 tablet 5   ??? hydrOXYzine (ATARAX) 25 MG tablet Take 1 tablet (25 mg total) by mouth every eight (8) hours as needed for itching. 30 tablet 11   ??? loperamide (IMODIUM) 2 mg capsule Take 4 mg (2 tablets) at first onset of diarrhea then 2 mg (1 tablet) with additional diarrhea episodes as needed. Maximum 4 tablet per day. 60 capsule 11   ??? magnesium oxide-Mg AA chelate (MAGNESIUM, AMINO ACID CHELATE,) 133 mg Tab Take 1 tablet by mouth daily. 30 tablet 11   ??? mycophenolate (MYFORTIC)  180 MG EC tablet Take 2 tablets (360 mg total) by mouth Two (2) times a day. 120 tablet 11   ??? nystatin (MYCOSTATIN) 100,000 unit/mL suspension Take 5 mL (500,000 Units total) by mouth Four (4) times a day for 14 days. 280 mL 0   ??? sodium bicarbonate 650 mg tablet Take 2 tablets (1,300 mg total) by mouth Two (2) times a day. 120 tablet 11   ??? sodium zirconium cyclosilicate (LOKELMA) 5 gram PwPk packet Take 1 packet (5 g total) by mouth daily. 30 each 5   ??? tacrolimus (ENVARSUS XR) 4 mg Tb24 extended release tablet Take 20 mg by mouth daily. 450 tablet 3   ??? valGANciclovir (VALCYTE) 450 mg tablet Take 1 tablet (450 mg total) by mouth daily. 15 tablet 0 ??? zolpidem (AMBIEN) 10 mg tablet Take 10 mg by mouth nightly as needed for sleep.     ??? [DISCONTINUED] atovaquone (MEPRON) 750 mg/5 mL suspension Take 10 mL (1,500 mg total) by mouth daily. 300 mL 2   ??? [DISCONTINUED] calcium acetate,phosphat bind, (PHOSLO) 667 mg capsule Take 2 capsules (1,334 mg total) by mouth Three (3) times a day with a meal. 180 capsule 11   ??? [DISCONTINUED] ergocalciferol (DRISDOL) 1,250 mcg (50,000 unit) capsule TAKE 1 CAPSULE BY MOUTH ONE TIME PER WEEK 8 capsule 0   ??? [DISCONTINUED] furosemide (LASIX) 40 MG tablet Take 0.5 tablets (20 mg total) by mouth daily for 7 days. 30 tablet 11   ??? [DISCONTINUED] gabapentin (NEURONTIN) 100 MG capsule Take 1 capsule (100 mg total) by mouth nightly for 12 days. 12 capsule 0   ??? [DISCONTINUED] magnesium oxide-Mg AA chelate (MAGNESIUM, AMINO ACID CHELATE,) 133 mg Tab Take 1 tablet by mouth Two (2) times a day. HOLD until directed to start by your coordinator. 100 tablet 11   ??? [DISCONTINUED] sodium polystyrene sulfonate (SODIUM POLYSTYRENE) 15 gram/60 mL Susp Take 120 mL (30 g total) by mouth daily for 2 days. 240 mL 1   ??? [DISCONTINUED] sodium zirconium cyclosilicate (LOKELMA) 5 gram PwPk packet Mix 1 packet (5g) as directed and take by mouth every other day. 15 each 5   ??? [DISCONTINUED] valGANciclovir (VALCYTE) 450 mg tablet Take 1 tablet (450 mg total) by mouth Every Monday and Thursday. 30 tablet 2     No facility-administered encounter medications on file as of 05/10/2019.        Induction agent : alemtuzumab    CURRENT IMMUNOSUPPRESSION: Envarsus 20 mg PO qd  prograf/Envarsus/cyclosporine goal: 8-10   myfortic360  mg PO bid    steroid free    Patient is tolerating immunosuppression well    IMMUNOSUPPRESSION DRUG LEVELS:  Lab Results   Component Value Date    Tacrolimus, Trough 5.3 05/06/2019    Tacrolimus, Trough 7.5 05/04/2019    Tacrolimus, Trough 7.7 05/02/2019    Tacrolimus, Trough <0.5 03/17/2014    Tacrolimus, Trough 7.7 04/05/2013 Tacrolimus, Trough 7.1 03/29/2013    Tacrolimus, Timed 6.5 05/10/2019     No results found for: CYCLO  No results found for: EVEROLIMUS  No results found for: SIROLIMUS    Envarsus level is accurate 24 hour trough    Graft function: complicated by DGF  DSA: ntd  Biopsies to date: zero hour biopsy negative for diagnostic abnormalities  UPC: 0.109  WBC/ANC:  WBC 3.6; ANC 3.1    Plan: Will maintain current immunosuppression. Continue to monitor.    OI Prophylaxis:   CMV Status: D+/ R+, moderate risk . CMV prophylaxis:  valganciclovir 450 mg every other day x 3 months per protocol. (ends 05/22/19)  Lab Results   Component Value Date    CMV Quant 1468 08/22/2008     PCP Prophylaxis: on hold x 6 months. (ends 08/20/19). Stopped bactrim d/t hyperkalemia.  Thrush: completed in hospital  Patient is  tolerating infectious prophylaxis well    Plan: Start Atovaquone 1500 mg daily. Increase valganciclovir to daily until completion of therapy for mouth ulceration given concern for potential HSV.  If this doesn't resolve in 1 week can try Valtrex. Continue to monitor.    Thrush: Presents with white film coating tongue.   Plan: Nystatin 500,000 units 4x daily x 14 days    CV Prophylaxis: holding asa 81 mg for anemia  The ASCVD Risk score Denman George DC Montez Hageman, et al., 2013) failed to calculate.  Statin therapy: Not indicated given age  Plan: Continue to monitor     BP: Goal < 140/90. Clinic vitals reported above  Home BP ranges: 130s-150s/90s  Current meds include: amlodipine 10 mg daily, carvedilol 6.25 mg BID  Plan: out of goal. Start chlorthalidone 25 mg daily to assist with lowering potassium. Continue to monitor    Anemia of CKD:  H/H:   Lab Results   Component Value Date    HGB 10.5 (L) 05/10/2019     Lab Results   Component Value Date    HCT 35.1 (L) 05/10/2019     Iron panel:  Lab Results   Component Value Date    IRON 187 (H) 03/25/2019    TIBC 245.3 (L) 03/25/2019    FERRITIN 2,720.0 (H) 03/25/2019     Lab Results Component Value Date    Iron Saturation (%) 76 (H) 03/25/2019    Iron Saturation (%) 15 (L) 03/21/2014       Prior ESA use: aranesp on 10/23 and 10/13    Plan: stable. Continue to monitor.     DM:   Lab Results   Component Value Date    A1C 4.9 02/20/2019   . Goal A1c < 7  History of Dm? No  Plan:  Continue to monitor.    Electrolytes: K 5.5, CO2 21, Ca 11.9, Mg 1.4, Phos 2.0  Meds currently on: PhosLo 1334 mg BID, Sodium bicarb 1300 mg BID, Vit D 50,000 U daily, Lokelma 5g every other day.    Plan:   K - Increase Lokelma 5g to daily   - Start chlorthalidone 25 mg to induce potassium wasting   CO2 - Continue sodium bicarb 1300 mg BID    Ca - Stop Vit D 50,000 U   Mg - Start Mg w/ AA once daily    Phos - Stop PhosLo       GI/BM: pt reports 1-2BM. Diarrhea and heartburn has resolve relative to prior visit.  Meds currently on: docusate PRN (no usage), Miralax PRN (no usage), loperamide PRN (no usage), famotidine 20 mg PRN (no usage)  Plan: Continue to monitor    Pain: pt reports mild pain  Meds currently on: APAP PRN (using 1g primarily for fever), tramadol PRN (no usage)  Plan: Continue to monitor    Sleep: Pt reports difficulty sleeping  Meds currently on: zolpidem 10mg  nightly, pt taking Zzzquil for sleep  Plan: Continue to monitor    Bone health:   Vitamin D Level: 9.6 on 03/02/19. Goal > 30.   Last DEXA results:  none available  Current meds include: ergocalciferol (Vit D) 50,000 units weekly  Plan: Vitamin  D level out of goal. Stop Vit D d/t elevated Ca. Continue to monitor.     Women's/Men's Health:  Russell Wade is a 33 y.o. male. Patient reports no men's/women's health issues  Plan: Continue to monitor    Itching  Meds currently on: hydroxyzine PRN (hasn't needed)  Plan: continue to monitor    Pharmacy preference:  [facility]    Adherence: Patient has average understanding of medications; was able to independently identify names/doses of immunosuppressants and OI meds. Patient  does fill their own pill box on a regular basis at home.  Patient brought medication card:no  Pill box:did not bring  Plan: provided extensive adherence counseling/intervention    Spent approximately 40 minutes on educating this patient and greater than 50% was spent in direct face to face counseling regarding post transplant medication education. Questions and concerns were address to patient's satisfaction.    Patient was reviewed with Dr. Tamera Reason. Lovena Neighbours who was agreement with the stated plan    During this visit, the following was completed:   BG log data assessment  BP log data assessment  Labs ordered and evaluated  complex treatment plan >1 DS   Patient education was completed for 11-24 minutes     All questions/concerns were addressed to the patient's satisfaction.  __________________________________________  Dessie Coma, PharmD  PGY-1 Pharmacy Resident    Cleone Slim, PHARMD, CPP  [employer]ID [employer] TRANSPLANT CLINICAL PHARMACIST PRACTITIONER  PAGER [phone]

## 2019-05-11 NOTE — Unmapped (Signed)
[facility] Specialty Medication Onboarding    Specialty Medication: VALGANCYCLOVIR AND LOKELMA  Prior Authorization: Not Required   Financial Assistance: No - copay  <$25  Final Copay/Day Supply: $0 / 15 DAYS    Insurance Restrictions: None     Notes to Pharmacist: DOSE CHANGES, Per Provider comments: New inpatient- benefit investigation    The triage team has completed the benefits investigation and has determined that the patient is able to fill this medication at [facility]. Please contact the patient to complete the onboarding or follow up with the prescribing physician as needed.

## 2019-05-11 NOTE — Unmapped (Addendum)
11/25: verified with [facility] that valcyte #15ds with no refills is correct. I called pt back and communicated this to him. He has appt in 1 month and just asked that if anything changes and he needs more that the clinic call him - I sent msg to clinic on this -ef      [facility] Clinical Assessment & Refill Coordination Note    Russell Wade, DOB: [birth year]  Phone: [phone] (home) [phone] (work)    All above HIPAA information was verified with patient.     Specialty Medication(s):   Transplant: Envarsus 4mg , Myfortic 180mg , valgancyclovir 450mg  and atovaquone 750/5   Pt is no longer on envarsus 1mg  tablets     Current Outpatient Medications   Medication Sig Dispense Refill   ??? acetaminophen (TYLENOL) 500 MG tablet Take 1-2 tablets (500-1,000 mg total) by mouth every six (6) hours as needed for pain or fever (> 38C). 100 tablet 11   ??? amLODIPine (NORVASC) 5 MG tablet Take 2 tablets (10 mg total) by mouth daily. 60 tablet 11   ??? aspirin (ECOTRIN) 81 MG tablet HOLD until directed to start in clinic. 30 tablet 11   ??? atovaquone (MEPRON) 750 mg/5 mL suspension Take 10 mL (1,500 mg total) by mouth daily. 300 mL 2   ??? carvediloL (COREG) 6.25 MG tablet TAKE 1 TABLET (6.25 MG TOTAL) BY MOUTH TWO (2) TIMES A DAY. 180 tablet 3   ??? chlorthalidone (HYGROTON) 25 MG tablet Take 1 tablet (25 mg total) by mouth every morning. 30 tablet 11   ??? famotidine (PEPCID) 20 MG tablet Take 1 tablet (20 mg total) by mouth daily. 30 tablet 5   ??? hydrOXYzine (ATARAX) 25 MG tablet Take 1 tablet (25 mg total) by mouth every eight (8) hours as needed for itching. 30 tablet 11   ??? loperamide (IMODIUM) 2 mg capsule Take 4 mg (2 tablets) at first onset of diarrhea then 2 mg (1 tablet) with additional diarrhea episodes as needed. Maximum 4 tablet per day. 60 capsule 11   ??? magnesium oxide-Mg AA chelate (MAGNESIUM, AMINO ACID CHELATE,) 133 mg Tab Take 1 tablet by mouth daily. 30 tablet 11 ??? mycophenolate (MYFORTIC) 180 MG EC tablet Take 2 tablets (360 mg total) by mouth Two (2) times a day. 120 tablet 11   ??? nystatin (MYCOSTATIN) 100,000 unit/mL suspension Take 5 mL (500,000 Units total) by mouth Four (4) times a day for 14 days. 280 mL 0   ??? sodium bicarbonate 650 mg tablet Take 2 tablets (1,300 mg total) by mouth Two (2) times a day. 120 tablet 11   ??? sodium zirconium cyclosilicate (LOKELMA) 5 gram PwPk packet Take 1 packet (5 g total) by mouth daily. 30 each 5   ??? tacrolimus (ENVARSUS XR) 4 mg Tb24 extended release tablet Take 20 mg by mouth daily. 450 tablet 3   ??? valGANciclovir (VALCYTE) 450 mg tablet Take 1 tablet (450 mg total) by mouth daily. 15 tablet 0   ??? zolpidem (AMBIEN) 10 mg tablet Take 10 mg by mouth nightly as needed for sleep.       No current facility-administered medications for this visit.         Changes to medications: lokelma is daily, see spec med changes below    Allergies   Allergen Reactions   ??? Tuberculin Ppd      Other reaction(s): Skin Rash       Changes to allergies: No  SPECIALTY MEDICATION ADHERENCE     envarsus 1mg   : 10 days of medicine on hand - but no longer using  envarsus 4mg   : 5 days of medicine on hand   Myfortic 180mg   : 5 days of medicine on hand   valganciclovir 450mg   : 5 days of medicine on hand   Atovaquone 750/5  : 1 month of medicine on hand     Medication Adherence    Patient reported X missed doses in the last month: 0  Specialty Medication: envarsus 1mg   Patient is on additional specialty medications: Yes  Additional Specialty Medications: envarsus 4mg   Patient Reported Additional Medication X Missed Doses in the Last Month: 0  Patient is on more than two specialty medications: Yes  Specialty Medication: atovaquone 750/5  Patient Reported Additional Medication X Missed Doses in the Last Month: 0  Specialty Medication: valganciclovir 450mg   Patient Reported Additional Medication X Missed Doses in the Last Month: 0 Specialty Medication: myfortic 180mg   Patient Reported Additional Medication X Missed Doses in the Last Month: 0          Specialty medication(s) dose(s) confirmed: Patient reports changes to the regimen as follows: envarsus is now 20mg  daily - requesting new rx today. valcyte is daily - new rx on file     Are there any concerns with adherence? No    Adherence counseling provided? Not needed    CLINICAL MANAGEMENT AND INTERVENTION      Clinical Benefit Assessment:    Do you feel the medicine is effective or helping your condition? Yes    Clinical Benefit counseling provided? Not needed    Adverse Effects Assessment:    Are you experiencing any side effects? No    Are you experiencing difficulty administering your medicine? No    Quality of Life Assessment:    How many days over the past month did your transplant  keep you from your normal activities? For example, brushing your teeth or getting up in the morning. 0    Have you discussed this with your provider? Not needed    Therapy Appropriateness:    Is therapy appropriate? Yes, therapy is appropriate and should be continued    DISEASE/MEDICATION-SPECIFIC INFORMATION      N/A    PATIENT SPECIFIC NEEDS     ? Does the patient have any physical, cognitive, or cultural barriers? No    ? Is the patient high risk? Yes, patient taking a REMS drug     ? Does the patient require a Care Management Plan? No     ? Does the patient require physician intervention or other additional services (i.e. nutrition, smoking cessation, social work)? No      SHIPPING     Specialty Medication(s) to be Shipped:   Transplant: Envarsus 4mg , Myfortic 180mg  and valgancyclovir 450mg     Other medication(s) to be shipped: sod bicarb, lokelma, apap, pepcid,  Amlodipine- 8rx total     Changes to insurance: No    Delivery Scheduled: Yes, Expected medication delivery date: 05/13/2019.  However, Rx request for refills was sent to the provider as there are none remaining. Medication will be delivered via UPS to the confirmed prescription address in [facility].    The patient will receive a drug information handout for each medication shipped and additional FDA Medication Guides as required.  Verified that patient has previously received a Conservation officer, historic buildings.    All of the patient's questions and concerns have been addressed.  [name]   [facility] Specialty Pharmacist

## 2019-05-12 LAB — CMV DNA, QUANTITATIVE, PCR: CMV VIRAL LD: NOT DETECTED

## 2019-05-12 LAB — CMV QUANT: Lab: 0

## 2019-05-13 LAB — BK VIRUS QUANTITATIVE PCR, BLOOD: BK BLOOD RESULT: NOT DETECTED

## 2019-05-13 LAB — VITAMIN D, TOTAL (25OH): Lab: 42.7

## 2019-05-13 LAB — BK BLOOD RESULT: Lab: NOT DETECTED

## 2019-05-14 ENCOUNTER — Other Ambulatory Visit (HOSPITAL_COMMUNITY)
Admission: RE | Admit: 2019-05-14 | Discharge: 2019-05-14 | Disposition: A | Discharge status: Home / Self Care | Payer: Medicare Other | Source: Ambulatory Visit | Attending: Nephrology | Admitting: Nephrology

## 2019-05-14 DIAGNOSIS — Z114 Encounter for screening for human immunodeficiency virus [HIV]: Secondary | ICD-10-CM | POA: Diagnosis not present

## 2019-05-14 DIAGNOSIS — Z940 Kidney transplant status: Secondary | ICD-10-CM | POA: Insufficient documentation

## 2019-05-14 DIAGNOSIS — Z79899 Other long term (current) drug therapy: Secondary | ICD-10-CM | POA: Diagnosis not present

## 2019-05-14 DIAGNOSIS — T8610 Unspecified complication of kidney transplant: Secondary | ICD-10-CM | POA: Insufficient documentation

## 2019-05-14 DIAGNOSIS — E559 Vitamin D deficiency, unspecified: Secondary | ICD-10-CM | POA: Diagnosis not present

## 2019-05-14 DIAGNOSIS — Z9483 Pancreas transplant status: Secondary | ICD-10-CM | POA: Insufficient documentation

## 2019-05-14 DIAGNOSIS — B259 Cytomegaloviral disease, unspecified: Secondary | ICD-10-CM | POA: Insufficient documentation

## 2019-05-14 DIAGNOSIS — Z09 Encounter for follow-up examination after completed treatment for conditions other than malignant neoplasm: Secondary | ICD-10-CM | POA: Insufficient documentation

## 2019-05-14 DIAGNOSIS — N390 Urinary tract infection, site not specified: Secondary | ICD-10-CM | POA: Diagnosis not present

## 2019-05-14 DIAGNOSIS — D899 Disorder involving the immune mechanism, unspecified: Secondary | ICD-10-CM | POA: Insufficient documentation

## 2019-05-14 DIAGNOSIS — D631 Anemia in chronic kidney disease: Secondary | ICD-10-CM | POA: Diagnosis not present

## 2019-05-14 DIAGNOSIS — N189 Chronic kidney disease, unspecified: Secondary | ICD-10-CM | POA: Diagnosis not present

## 2019-05-14 DIAGNOSIS — E1129 Type 2 diabetes mellitus with other diabetic kidney complication: Secondary | ICD-10-CM | POA: Insufficient documentation

## 2019-05-14 DIAGNOSIS — Z789 Other specified health status: Secondary | ICD-10-CM | POA: Insufficient documentation

## 2019-05-14 LAB — CBC WITH DIFFERENTIAL/PLATELET
Abs Immature Granulocytes: 0.47 10*3/uL — ABNORMAL HIGH (ref 0.00–0.07)
Basophils Absolute: 0 10*3/uL (ref 0.0–0.1)
Basophils Relative: 1 %
Eosinophils Absolute: 0.1 10*3/uL (ref 0.0–0.5)
Eosinophils Relative: 2 %
HCT: 33.3 % — ABNORMAL LOW (ref 39.0–52.0)
Hemoglobin: 10.5 g/dL — ABNORMAL LOW (ref 13.0–17.0)
Immature Granulocytes: 11 %
Lymphocytes Relative: 4 %
Lymphs Abs: 0.2 10*3/uL — ABNORMAL LOW (ref 0.7–4.0)
MCH: 25.5 pg — ABNORMAL LOW (ref 26.0–34.0)
MCHC: 31.5 g/dL (ref 30.0–36.0)
MCV: 80.8 fL (ref 80.0–100.0)
Monocytes Absolute: 0.4 10*3/uL (ref 0.1–1.0)
Monocytes Relative: 8 %
Neutro Abs: 3.1 10*3/uL (ref 1.7–7.7)
Neutrophils Relative %: 74 %
Platelets: 242 10*3/uL (ref 150–400)
RBC: 4.12 MIL/uL — ABNORMAL LOW (ref 4.22–5.81)
RDW: 16.5 % — ABNORMAL HIGH (ref 11.5–15.5)
WBC: 4.2 10*3/uL (ref 4.0–10.5)
nRBC: 0 % (ref 0.0–0.2)

## 2019-05-14 LAB — BASIC METABOLIC PANEL
Anion gap: 8 (ref 5–15)
BUN: 31 mg/dL — ABNORMAL HIGH (ref 6–20)
CO2: 22 mmol/L (ref 22–32)
Calcium: 11.5 mg/dL — ABNORMAL HIGH (ref 8.9–10.3)
Chloride: 106 mmol/L (ref 98–111)
Creatinine, Ser: 3.15 mg/dL — ABNORMAL HIGH (ref 0.61–1.24)
GFR calc Af Amer: 29 mL/min — ABNORMAL LOW (ref >60)
GFR calc non Af Amer: 25 mL/min — ABNORMAL LOW (ref >60)
Glucose, Bld: 119 mg/dL — ABNORMAL HIGH (ref 70–99)
Potassium: 5 mmol/L (ref 3.5–5.1)
Sodium: 136 mmol/L (ref 135–145)

## 2019-05-14 LAB — PHOSPHORUS: Phosphorus: 2.1 mg/dL — ABNORMAL LOW (ref 2.5–4.6)

## 2019-05-14 LAB — MAGNESIUM: Magnesium: 1.8 mg/dL (ref 1.7–2.4)

## 2019-05-16 LAB — TACROLIMUS LEVEL: Tacrolimus (FK506) - LabCorp: 9.1 ng/mL (ref 2.0–20.0)

## 2019-05-17 ENCOUNTER — Other Ambulatory Visit (HOSPITAL_COMMUNITY)
Admission: RE | Admit: 2019-05-17 | Discharge: 2019-05-17 | Disposition: A | Discharge status: Home / Self Care | Payer: Medicare Other | Source: Other Acute Inpatient Hospital | Attending: Nephrology | Admitting: Nephrology

## 2019-05-17 DIAGNOSIS — Z114 Encounter for screening for human immunodeficiency virus [HIV]: Secondary | ICD-10-CM | POA: Insufficient documentation

## 2019-05-17 DIAGNOSIS — T8610 Unspecified complication of kidney transplant: Secondary | ICD-10-CM | POA: Diagnosis not present

## 2019-05-17 DIAGNOSIS — D899 Disorder involving the immune mechanism, unspecified: Secondary | ICD-10-CM | POA: Insufficient documentation

## 2019-05-17 DIAGNOSIS — N390 Urinary tract infection, site not specified: Secondary | ICD-10-CM | POA: Insufficient documentation

## 2019-05-17 DIAGNOSIS — Z789 Other specified health status: Secondary | ICD-10-CM | POA: Diagnosis not present

## 2019-05-17 DIAGNOSIS — D631 Anemia in chronic kidney disease: Secondary | ICD-10-CM | POA: Insufficient documentation

## 2019-05-17 DIAGNOSIS — N189 Chronic kidney disease, unspecified: Secondary | ICD-10-CM | POA: Insufficient documentation

## 2019-05-17 DIAGNOSIS — E559 Vitamin D deficiency, unspecified: Secondary | ICD-10-CM | POA: Insufficient documentation

## 2019-05-17 DIAGNOSIS — Z9483 Pancreas transplant status: Secondary | ICD-10-CM | POA: Diagnosis not present

## 2019-05-17 DIAGNOSIS — Z79899 Other long term (current) drug therapy: Secondary | ICD-10-CM | POA: Diagnosis not present

## 2019-05-17 DIAGNOSIS — Z09 Encounter for follow-up examination after completed treatment for conditions other than malignant neoplasm: Secondary | ICD-10-CM | POA: Insufficient documentation

## 2019-05-17 DIAGNOSIS — B259 Cytomegaloviral disease, unspecified: Secondary | ICD-10-CM | POA: Diagnosis not present

## 2019-05-17 DIAGNOSIS — E1129 Type 2 diabetes mellitus with other diabetic kidney complication: Secondary | ICD-10-CM | POA: Diagnosis not present

## 2019-05-17 DIAGNOSIS — Z940 Kidney transplant status: Secondary | ICD-10-CM | POA: Diagnosis not present

## 2019-05-17 LAB — CBC W/ DIFFERENTIAL
BASOPHILS ABSOLUTE COUNT: 0 10*9/L
BASOPHILS RELATIVE PERCENT: 1 %
EOSINOPHILS ABSOLUTE COUNT: 0.1 10*9/L
EOSINOPHILS RELATIVE PERCENT: 2 %
HEMATOCRIT: 33.8 % — ABNORMAL LOW
LYMPHOCYTES ABSOLUTE COUNT: 0.1 10*9/L — ABNORMAL LOW
MEAN CORPUSCULAR HEMOGLOBIN CONC: 31.4 g/dL
MEAN CORPUSCULAR HEMOGLOBIN: 25.9 pg — ABNORMAL LOW
MEAN CORPUSCULAR VOLUME: 62.4 fL
MONOCYTES ABSOLUTE COUNT: 0.1 10*9/L
MONOCYTES RELATIVE PERCENT: 2 %
NEUTROPHILS ABSOLUTE COUNT: 2.9 10*9/L
NEUTROPHILS RELATIVE PERCENT: 9 %
NUCLEATED RED BLOOD CELLS: 0 /100{WBCs}
PLATELET COUNT: 246 10*9/L
RED BLOOD CELL COUNT: 4.1 10*12/L — ABNORMAL LOW
RED CELL DISTRIBUTION WIDTH: 16.5 % — ABNORMAL HIGH
WHITE BLOOD CELL COUNT: 3.2 10*9/L — ABNORMAL LOW

## 2019-05-17 LAB — HLA DS POST TRANSPLANT
ANTI-DONOR DRW #1 MFI: 127 MFI
ANTI-DONOR HLA-A #1 MFI: 0 MFI
ANTI-DONOR HLA-A #2 MFI: 0 MFI
ANTI-DONOR HLA-B #1 MFI: 0 MFI
ANTI-DONOR HLA-B #2 MFI: 0 MFI
ANTI-DONOR HLA-C #1 MFI: 0 MFI
ANTI-DONOR HLA-C #2 MFI: 31 MFI
ANTI-DONOR HLA-DP #2 MFI: 115 MFI
ANTI-DONOR HLA-DQB #2 MFI: 138 MFI
ANTI-DONOR HLA-DR #1 MFI: 170 MFI
ANTI-DONOR HLA-DR #2 MFI: 210 MFI

## 2019-05-17 LAB — FSAB CLASS 1 ANTIBODY SPECIFICITY

## 2019-05-17 LAB — COMPREHENSIVE METABOLIC PANEL
BLOOD UREA NITROGEN: 32 mg/dL — ABNORMAL HIGH
CALCIUM: 11.8 mg/dL — ABNORMAL HIGH
CO2: 21 mmol/L — ABNORMAL LOW
CREATININE: 3.14 mg/dL — ABNORMAL HIGH
EGFR CKD-EPI NON-AA MALE: 25 mL/min/{1.73_m2} — ABNORMAL LOW
GLUCOSE RANDOM: 165 mg/dL — ABNORMAL HIGH
POTASSIUM: 5.4 mmol/L — ABNORMAL HIGH

## 2019-05-17 LAB — FSAB CLASS 2 ANTIBODY SPECIFICITY: HLA CL2 AB RESULT: POSITIVE

## 2019-05-17 LAB — HLA CL2 AB RESULT: Lab: POSITIVE

## 2019-05-17 LAB — MAGNESIUM
Lab: 1.8
Magnesium: 1.8 mg/dL (ref 1.7–2.4)

## 2019-05-17 LAB — ANTI-DONOR HLA-DQB #2 MFI: Lab: 138

## 2019-05-17 LAB — PHOSPHORUS
Lab: 2.8
Phosphorus: 2.8 mg/dL (ref 2.5–4.6)

## 2019-05-17 LAB — HLA CL1 ANTIBODY COMM: Lab: 0

## 2019-05-17 LAB — AST (SGOT): Lab: 0

## 2019-05-17 LAB — ANISOCYTOSIS: Lab: 0

## 2019-05-17 LAB — CBC WITH DIFFERENTIAL/PLATELET
Abs Immature Granulocytes: 0 10*3/uL (ref 0.00–0.07)
Band Neutrophils: 1 %
Basophils Absolute: 0 10*3/uL (ref 0.0–0.1)
Basophils Relative: 1 %
Eosinophils Absolute: 0.1 10*3/uL (ref 0.0–0.5)
Eosinophils Relative: 2 %
HCT: 33.8 % — ABNORMAL LOW (ref 39.0–52.0)
Hemoglobin: 10.6 g/dL — ABNORMAL LOW (ref 13.0–17.0)
Lymphocytes Relative: 4 %
Lymphs Abs: 0.1 10*3/uL — ABNORMAL LOW (ref 0.7–4.0)
MCH: 25.9 pg — ABNORMAL LOW (ref 26.0–34.0)
MCHC: 31.4 g/dL (ref 30.0–36.0)
MCV: 82.4 fL (ref 80.0–100.0)
Monocytes Absolute: 0.1 10*3/uL (ref 0.1–1.0)
Monocytes Relative: 2 %
Neutro Abs: 2.9 10*3/uL (ref 1.7–7.7)
Neutrophils Relative %: 90 %
Platelets: 246 10*3/uL (ref 150–400)
RBC: 4.1 MIL/uL — ABNORMAL LOW (ref 4.22–5.81)
RDW: 16.5 % — ABNORMAL HIGH (ref 11.5–15.5)
WBC: 3.2 10*3/uL — ABNORMAL LOW (ref 4.0–10.5)
nRBC: 0 % (ref 0.0–0.2)
nRBC: 0 /100{WBCs}

## 2019-05-17 LAB — BASIC METABOLIC PANEL
Anion gap: 12 (ref 5–15)
BUN: 32 mg/dL — ABNORMAL HIGH (ref 6–20)
CO2: 21 mmol/L — ABNORMAL LOW (ref 22–32)
Calcium: 11.8 mg/dL — ABNORMAL HIGH (ref 8.9–10.3)
Chloride: 107 mmol/L (ref 98–111)
Creatinine, Ser: 3.14 mg/dL — ABNORMAL HIGH (ref 0.61–1.24)
GFR calc Af Amer: 29 mL/min — ABNORMAL LOW (ref >60)
GFR calc non Af Amer: 25 mL/min — ABNORMAL LOW (ref >60)
Glucose, Bld: 165 mg/dL — ABNORMAL HIGH (ref 70–99)
Potassium: 5.4 mmol/L — ABNORMAL HIGH (ref 3.5–5.1)
Sodium: 140 mmol/L (ref 135–145)

## 2019-05-18 LAB — TACROLIMUS LEVEL: Tacrolimus (FK506) - LabCorp: 17.7 ng/mL (ref 2.0–20.0)

## 2019-05-19 DIAGNOSIS — Z940 Kidney transplant status: Principal | ICD-10-CM

## 2019-05-19 DIAGNOSIS — D899 Disorder involving the immune mechanism, unspecified: Principal | ICD-10-CM

## 2019-05-19 LAB — TACROLIMUS, TROUGH: Lab: 17.7

## 2019-05-19 NOTE — Unmapped (Signed)
After reviewing recent lab values from 12-1 with MD Trevor Iha, patient tacrolimus level is 17.7     Called patient and confirmed tac level is a true trough and he took his medication after his labs were drawn that morning. Per MD Kleman, patient to hold a dose for Friday 12-4 and decrease Envarsus to 19 mg daily.   Patient advised to Hold Friday 12/4  And re-start on Saturday 12/5     Reviewed his new dose combination amounts will be   4-4 mg of Envarsus =16 mg  3-1 mg of Envarsus = 3 mg   For a total of 19 mg   Updated Rx sent to [facility]    Also advised patient to drink at least 5- 16 oz bottles of water.     Patient to get lab Monday/Thursday     Patient verbalizes chanes and My chart message sent.

## 2019-05-20 ENCOUNTER — Other Ambulatory Visit (HOSPITAL_COMMUNITY)
Admission: RE | Admit: 2019-05-20 | Discharge: 2019-05-20 | Disposition: A | Discharge status: Home / Self Care | Payer: Medicare Other | Source: Other Acute Inpatient Hospital | Attending: Nephrology | Admitting: Nephrology

## 2019-05-20 DIAGNOSIS — D631 Anemia in chronic kidney disease: Secondary | ICD-10-CM | POA: Insufficient documentation

## 2019-05-20 DIAGNOSIS — Z79899 Other long term (current) drug therapy: Secondary | ICD-10-CM | POA: Diagnosis not present

## 2019-05-20 DIAGNOSIS — E1129 Type 2 diabetes mellitus with other diabetic kidney complication: Secondary | ICD-10-CM | POA: Insufficient documentation

## 2019-05-20 DIAGNOSIS — Z114 Encounter for screening for human immunodeficiency virus [HIV]: Secondary | ICD-10-CM | POA: Diagnosis not present

## 2019-05-20 DIAGNOSIS — N390 Urinary tract infection, site not specified: Secondary | ICD-10-CM | POA: Diagnosis not present

## 2019-05-20 DIAGNOSIS — B259 Cytomegaloviral disease, unspecified: Secondary | ICD-10-CM | POA: Insufficient documentation

## 2019-05-20 DIAGNOSIS — Z940 Kidney transplant status: Secondary | ICD-10-CM | POA: Insufficient documentation

## 2019-05-20 DIAGNOSIS — E559 Vitamin D deficiency, unspecified: Secondary | ICD-10-CM | POA: Insufficient documentation

## 2019-05-20 DIAGNOSIS — D899 Disorder involving the immune mechanism, unspecified: Secondary | ICD-10-CM | POA: Diagnosis not present

## 2019-05-20 DIAGNOSIS — Z9483 Pancreas transplant status: Secondary | ICD-10-CM | POA: Insufficient documentation

## 2019-05-20 DIAGNOSIS — Z789 Other specified health status: Secondary | ICD-10-CM | POA: Diagnosis not present

## 2019-05-20 DIAGNOSIS — Z09 Encounter for follow-up examination after completed treatment for conditions other than malignant neoplasm: Secondary | ICD-10-CM | POA: Insufficient documentation

## 2019-05-20 LAB — CBC WITH DIFFERENTIAL/PLATELET
Abs Immature Granulocytes: 0 10*3/uL (ref 0.00–0.07)
Basophils Absolute: 0 10*3/uL (ref 0.0–0.1)
Basophils Relative: 1 %
Eosinophils Absolute: 0.1 10*3/uL (ref 0.0–0.5)
Eosinophils Relative: 3 %
HCT: 33.3 % — ABNORMAL LOW (ref 39.0–52.0)
Hemoglobin: 10.4 g/dL — ABNORMAL LOW (ref 13.0–17.0)
Lymphocytes Relative: 6 %
Lymphs Abs: 0.2 10*3/uL — ABNORMAL LOW (ref 0.7–4.0)
MCH: 25.7 pg — ABNORMAL LOW (ref 26.0–34.0)
MCHC: 31.2 g/dL (ref 30.0–36.0)
MCV: 82.2 fL (ref 80.0–100.0)
Monocytes Absolute: 0.1 10*3/uL (ref 0.1–1.0)
Monocytes Relative: 2 %
Neutro Abs: 3 10*3/uL (ref 1.7–7.7)
Neutrophils Relative %: 88 %
Platelets: 279 10*3/uL (ref 150–400)
RBC: 4.05 MIL/uL — ABNORMAL LOW (ref 4.22–5.81)
RDW: 16.3 % — ABNORMAL HIGH (ref 11.5–15.5)
WBC: 3.4 10*3/uL — ABNORMAL LOW (ref 4.0–10.5)
nRBC: 0 % (ref 0.0–0.2)
nRBC: 0 /100 WBC

## 2019-05-20 LAB — BASIC METABOLIC PANEL
Anion gap: 8 (ref 5–15)
BUN: 37 mg/dL — ABNORMAL HIGH (ref 6–20)
CO2: 23 mmol/L (ref 22–32)
Calcium: 11.9 mg/dL — ABNORMAL HIGH (ref 8.9–10.3)
Chloride: 107 mmol/L (ref 98–111)
Creatinine, Ser: 4.02 mg/dL — ABNORMAL HIGH (ref 0.61–1.24)
GFR calc Af Amer: 21 mL/min — ABNORMAL LOW (ref >60)
GFR calc non Af Amer: 18 mL/min — ABNORMAL LOW (ref >60)
Glucose, Bld: 117 mg/dL — ABNORMAL HIGH (ref 70–99)
Potassium: 5.2 mmol/L — ABNORMAL HIGH (ref 3.5–5.1)
Sodium: 138 mmol/L (ref 135–145)

## 2019-05-20 LAB — MAGNESIUM: Magnesium: 1.9 mg/dL (ref 1.7–2.4)

## 2019-05-20 LAB — PHOSPHORUS: Phosphorus: 2.4 mg/dL — ABNORMAL LOW (ref 2.5–4.6)

## 2019-05-20 MED FILL — ENVARSUS XR 1 MG TABLET,EXTENDED RELEASE: ORAL | 30 days supply | Qty: 90 | Fill #0

## 2019-05-20 MED FILL — ENVARSUS XR 1 MG TABLET,EXTENDED RELEASE: 30 days supply | Qty: 90 | Fill #0 | Status: AC

## 2019-05-20 NOTE — Unmapped (Signed)
[facility] Refill Coordination Note    Specialty Medication(s) to be Shipped:   Transplant: Envarsus 1mg     Other medication(s) to be shipped: NONE   Patient declined needing any other specialty meds (Myfortic, Atovaquone, Valganciclovir)      Russell Wade, DOB: [birth year]  Phone: [phone] (home) [phone] (work)      All above HIPAA information was verified with patient.     Was a Nurse, learning disability used for this call? No    Completed refill call assessment today to schedule patient's medication shipment from the [facility] [phone]).       Specialty medication(s) and dose(s) confirmed: Regimen is correct and unchanged.   Changes to medications: Gaither reports no changes at this time.  Changes to insurance: No  Questions for the pharmacist: No    Confirmed patient received Welcome Packet with first shipment. The patient will receive a drug information handout for each medication shipped and additional FDA Medication Guides as required.       DISEASE/MEDICATION-SPECIFIC INFORMATION        N/A    SPECIALTY MEDICATION ADHERENCE     Medication Adherence    Patient reported X missed doses in the last month: 0  Specialty Medication: Envarsus Xr   Patient is on additional specialty medications: Yes  Additional Specialty Medications: Myfortic 180mg   Patient Reported Additional Medication X Missed Doses in the Last Month: 0  Patient is on more than two specialty medications: Yes  Specialty Medication: Valganciclovir 450mg   Patient Reported Additional Medication X Missed Doses in the Last Month: 0                Envarsus Xr 1 mg: 0 days of medicine on hand   Envarsus Xr 4 mg: 20 days of medicine on hand   Atovaquone 750 mg/29ml: 20 days of medicine on hand   Valganciclorvir 450 mg: 20 days of medicine on hand        SHIPPING     Shipping address confirmed in Epic.     Delivery Scheduled: Yes, Expected medication delivery date: 05/21/2019. Medication will be delivered via UPS to the prescription address in Epic [facility].    Tera Helper   [facility] Specialty Pharmacist

## 2019-05-20 NOTE — Unmapped (Signed)
Change in Envarsus dosage from 20 mg to 19 mg. Co-pay $0.00 for both 1 and 4 mg tablets.

## 2019-05-21 MED ORDER — TACROLIMUS ER 1 MG TABLET,EXTENDED RELEASE 24 HR
ORAL_TABLET | 11 refills | 0 days | Status: CP
Start: 2019-05-21 — End: ?

## 2019-05-21 MED ORDER — TACROLIMUS ER 4 MG TABLET,EXTENDED RELEASE 24 HR
ORAL_TABLET | 3 refills | 0 days | Status: CP
Start: 2019-05-21 — End: ?

## 2019-05-23 ENCOUNTER — Other Ambulatory Visit (HOSPITAL_COMMUNITY)
Admission: RE | Admit: 2019-05-23 | Discharge: 2019-05-23 | Disposition: A | Discharge status: Home / Self Care | Payer: Medicare Other | Source: Other Acute Inpatient Hospital | Attending: Nephrology | Admitting: Nephrology

## 2019-05-23 DIAGNOSIS — D899 Disorder involving the immune mechanism, unspecified: Secondary | ICD-10-CM | POA: Diagnosis not present

## 2019-05-23 DIAGNOSIS — Z114 Encounter for screening for human immunodeficiency virus [HIV]: Secondary | ICD-10-CM | POA: Insufficient documentation

## 2019-05-23 DIAGNOSIS — E559 Vitamin D deficiency, unspecified: Secondary | ICD-10-CM | POA: Diagnosis not present

## 2019-05-23 DIAGNOSIS — N390 Urinary tract infection, site not specified: Secondary | ICD-10-CM | POA: Insufficient documentation

## 2019-05-23 DIAGNOSIS — T8610 Unspecified complication of kidney transplant: Secondary | ICD-10-CM | POA: Diagnosis not present

## 2019-05-23 DIAGNOSIS — Z940 Kidney transplant status: Secondary | ICD-10-CM | POA: Diagnosis not present

## 2019-05-23 DIAGNOSIS — N189 Chronic kidney disease, unspecified: Secondary | ICD-10-CM | POA: Diagnosis not present

## 2019-05-23 DIAGNOSIS — Z09 Encounter for follow-up examination after completed treatment for conditions other than malignant neoplasm: Secondary | ICD-10-CM | POA: Insufficient documentation

## 2019-05-23 DIAGNOSIS — D631 Anemia in chronic kidney disease: Secondary | ICD-10-CM | POA: Diagnosis not present

## 2019-05-23 DIAGNOSIS — B259 Cytomegaloviral disease, unspecified: Secondary | ICD-10-CM | POA: Insufficient documentation

## 2019-05-23 DIAGNOSIS — Z79899 Other long term (current) drug therapy: Secondary | ICD-10-CM | POA: Insufficient documentation

## 2019-05-23 DIAGNOSIS — Z9483 Pancreas transplant status: Secondary | ICD-10-CM | POA: Diagnosis not present

## 2019-05-23 DIAGNOSIS — E1129 Type 2 diabetes mellitus with other diabetic kidney complication: Secondary | ICD-10-CM | POA: Diagnosis not present

## 2019-05-23 DIAGNOSIS — Z789 Other specified health status: Secondary | ICD-10-CM | POA: Diagnosis not present

## 2019-05-23 LAB — MAGNESIUM
Lab: 1.9
Magnesium: 2.1 mg/dL (ref 1.7–2.4)

## 2019-05-23 LAB — CBC W/ DIFFERENTIAL
BASOPHILS ABSOLUTE COUNT: 0 10*9/L
BASOPHILS RELATIVE PERCENT: 1 %
EOSINOPHILS ABSOLUTE COUNT: 0.1 10*9/L
EOSINOPHILS RELATIVE PERCENT: 3 %
HEMATOCRIT: 33.3 % — ABNORMAL LOW
HEMOGLOBIN: 10.4 g/dL — ABNORMAL LOW
LYMPHOCYTES ABSOLUTE COUNT: 0.2 10*9/L — ABNORMAL LOW
LYMPHOCYTES RELATIVE PERCENT: 6 %
MEAN CORPUSCULAR HEMOGLOBIN CONC: 31.2 g/dL
MONOCYTES ABSOLUTE COUNT: 0.1 10*9/L
MONOCYTES RELATIVE PERCENT: 2 %
NEUTROPHILS ABSOLUTE COUNT: 3 10*9/L
NEUTROPHILS RELATIVE PERCENT: 88 %
PLATELET COUNT: 279 10*9/L
RED BLOOD CELL COUNT: 4.05 10*12/L — ABNORMAL LOW
RED CELL DISTRIBUTION WIDTH: 16.3 % — ABNORMAL HIGH
WHITE BLOOD CELL COUNT: 3.4 10*9/L — ABNORMAL LOW

## 2019-05-23 LAB — BASIC METABOLIC PANEL
Anion gap: 10 (ref 5–15)
BLOOD UREA NITROGEN: 37 mg/dL — ABNORMAL HIGH
BUN: 55 mg/dL — ABNORMAL HIGH (ref 6–20)
CALCIUM: 11.9 mg/dL — ABNORMAL HIGH
CO2: 23 mmol/L
CO2: 23 mmol/L (ref 22–32)
CREATININE: 4.02 mg/dL — ABNORMAL HIGH
Calcium: 11.1 mg/dL — ABNORMAL HIGH (ref 8.9–10.3)
Chloride: 106 mmol/L (ref 98–111)
Creatinine, Ser: 3.91 mg/dL — ABNORMAL HIGH (ref 0.61–1.24)
GFR calc Af Amer: 22 mL/min — ABNORMAL LOW (ref >60)
GFR calc non Af Amer: 19 mL/min — ABNORMAL LOW (ref >60)
GLUCOSE RANDOM: 117 mg/dL — ABNORMAL HIGH
Glucose, Bld: 115 mg/dL — ABNORMAL HIGH (ref 70–99)
POTASSIUM: 5.2 mmol/L — ABNORMAL HIGH
Potassium: 4.9 mmol/L (ref 3.5–5.1)
SODIUM: 138 mmol/L
Sodium: 139 mmol/L (ref 135–145)

## 2019-05-23 LAB — PHOSPHORUS
Lab: 2.4 — ABNORMAL LOW
Phosphorus: 2.8 mg/dL (ref 2.5–4.6)

## 2019-05-23 LAB — CO2: Lab: 23

## 2019-05-23 LAB — TACROLIMUS, TROUGH: Lab: 16.3

## 2019-05-23 LAB — MEAN CORPUSCULAR VOLUME: Lab: 82.2

## 2019-05-23 LAB — CBC WITH DIFFERENTIAL/PLATELET
Abs Immature Granulocytes: 0.1 10*3/uL — ABNORMAL HIGH (ref 0.00–0.07)
Basophils Absolute: 0 10*3/uL (ref 0.0–0.1)
Basophils Relative: 0 %
Eosinophils Absolute: 0.1 10*3/uL (ref 0.0–0.5)
Eosinophils Relative: 3 %
HCT: 33 % — ABNORMAL LOW (ref 39.0–52.0)
Hemoglobin: 10.5 g/dL — ABNORMAL LOW (ref 13.0–17.0)
Lymphocytes Relative: 3 %
Lymphs Abs: 0.1 10*3/uL — ABNORMAL LOW (ref 0.7–4.0)
MCH: 25.4 pg — ABNORMAL LOW (ref 26.0–34.0)
MCHC: 31.8 g/dL (ref 30.0–36.0)
MCV: 79.9 fL — ABNORMAL LOW (ref 80.0–100.0)
Monocytes Absolute: 0.1 10*3/uL (ref 0.1–1.0)
Monocytes Relative: 4 %
Myelocytes: 2 %
Neutro Abs: 2.5 10*3/uL (ref 1.7–7.7)
Neutrophils Relative %: 88 %
Platelets: 254 10*3/uL (ref 150–400)
RBC: 4.13 MIL/uL — ABNORMAL LOW (ref 4.22–5.81)
RDW: 16.3 % — ABNORMAL HIGH (ref 11.5–15.5)
WBC: 2.8 10*3/uL — ABNORMAL LOW (ref 4.0–10.5)
nRBC: 0 % (ref 0.0–0.2)
nRBC: 0 /100 WBC

## 2019-05-23 LAB — TACROLIMUS LEVEL: Tacrolimus (FK506) - LabCorp: 16.3 ng/mL (ref 2.0–20.0)

## 2019-05-23 NOTE — Unmapped (Signed)
Pt Envarsus level 16.3; advised to decrease to 17 mg daily and repeat labs Friday.

## 2019-05-24 LAB — TACROLIMUS LEVEL: Tacrolimus (FK506) - LabCorp: 17.5 ng/mL (ref 2.0–20.0)

## 2019-05-27 ENCOUNTER — Other Ambulatory Visit (HOSPITAL_COMMUNITY)
Admission: AD | Admit: 2019-05-27 | Discharge: 2019-05-27 | Disposition: A | Discharge status: Home / Self Care | Payer: Medicare Other | Source: Ambulatory Visit | Attending: Nephrology | Admitting: Nephrology

## 2019-05-27 DIAGNOSIS — Z789 Other specified health status: Secondary | ICD-10-CM | POA: Insufficient documentation

## 2019-05-27 DIAGNOSIS — E1129 Type 2 diabetes mellitus with other diabetic kidney complication: Secondary | ICD-10-CM | POA: Insufficient documentation

## 2019-05-27 DIAGNOSIS — E559 Vitamin D deficiency, unspecified: Secondary | ICD-10-CM | POA: Insufficient documentation

## 2019-05-27 DIAGNOSIS — N390 Urinary tract infection, site not specified: Secondary | ICD-10-CM | POA: Diagnosis not present

## 2019-05-27 DIAGNOSIS — Z940 Kidney transplant status: Secondary | ICD-10-CM | POA: Insufficient documentation

## 2019-05-27 DIAGNOSIS — D899 Disorder involving the immune mechanism, unspecified: Secondary | ICD-10-CM | POA: Insufficient documentation

## 2019-05-27 DIAGNOSIS — B259 Cytomegaloviral disease, unspecified: Secondary | ICD-10-CM | POA: Diagnosis not present

## 2019-05-27 DIAGNOSIS — Z79899 Other long term (current) drug therapy: Secondary | ICD-10-CM | POA: Insufficient documentation

## 2019-05-27 DIAGNOSIS — Z9483 Pancreas transplant status: Secondary | ICD-10-CM | POA: Insufficient documentation

## 2019-05-27 DIAGNOSIS — T8610 Unspecified complication of kidney transplant: Secondary | ICD-10-CM | POA: Insufficient documentation

## 2019-05-27 DIAGNOSIS — Z09 Encounter for follow-up examination after completed treatment for conditions other than malignant neoplasm: Secondary | ICD-10-CM | POA: Insufficient documentation

## 2019-05-27 DIAGNOSIS — D631 Anemia in chronic kidney disease: Secondary | ICD-10-CM | POA: Insufficient documentation

## 2019-05-27 DIAGNOSIS — Z114 Encounter for screening for human immunodeficiency virus [HIV]: Secondary | ICD-10-CM | POA: Diagnosis not present

## 2019-05-27 DIAGNOSIS — N189 Chronic kidney disease, unspecified: Secondary | ICD-10-CM | POA: Insufficient documentation

## 2019-05-27 LAB — CBC WITH DIFFERENTIAL/PLATELET
Abs Immature Granulocytes: 0.31 10*3/uL — ABNORMAL HIGH (ref 0.00–0.07)
Basophils Absolute: 0 10*3/uL (ref 0.0–0.1)
Basophils Relative: 1 %
Eosinophils Absolute: 0.1 10*3/uL (ref 0.0–0.5)
Eosinophils Relative: 5 %
HCT: 32.9 % — ABNORMAL LOW (ref 39.0–52.0)
Hemoglobin: 10.1 g/dL — ABNORMAL LOW (ref 13.0–17.0)
Immature Granulocytes: 12 %
Lymphocytes Relative: 6 %
Lymphs Abs: 0.2 10*3/uL — ABNORMAL LOW (ref 0.7–4.0)
MCH: 25.1 pg — ABNORMAL LOW (ref 26.0–34.0)
MCHC: 30.7 g/dL (ref 30.0–36.0)
MCV: 81.8 fL (ref 80.0–100.0)
Monocytes Absolute: 0.1 10*3/uL (ref 0.1–1.0)
Monocytes Relative: 5 %
Neutro Abs: 1.8 10*3/uL (ref 1.7–7.7)
Neutrophils Relative %: 71 %
Platelets: 212 10*3/uL (ref 150–400)
RBC: 4.02 MIL/uL — ABNORMAL LOW (ref 4.22–5.81)
RDW: 16.3 % — ABNORMAL HIGH (ref 11.5–15.5)
WBC: 2.6 10*3/uL — ABNORMAL LOW (ref 4.0–10.5)
nRBC: 0 % (ref 0.0–0.2)

## 2019-05-27 LAB — BASIC METABOLIC PANEL
Anion gap: 6 (ref 5–15)
BUN: 35 mg/dL — ABNORMAL HIGH (ref 6–20)
CO2: 23 mmol/L (ref 22–32)
Calcium: 11 mg/dL — ABNORMAL HIGH (ref 8.9–10.3)
Chloride: 110 mmol/L (ref 98–111)
Creatinine, Ser: 3.26 mg/dL — ABNORMAL HIGH (ref 0.61–1.24)
GFR calc Af Amer: 27 mL/min — ABNORMAL LOW (ref >60)
GFR calc non Af Amer: 24 mL/min — ABNORMAL LOW (ref >60)
Glucose, Bld: 110 mg/dL — ABNORMAL HIGH (ref 70–99)
Potassium: 5.4 mmol/L — ABNORMAL HIGH (ref 3.5–5.1)
Sodium: 139 mmol/L (ref 135–145)

## 2019-05-27 LAB — PHOSPHORUS: Phosphorus: 2 mg/dL — ABNORMAL LOW (ref 2.5–4.6)

## 2019-05-27 LAB — MAGNESIUM: Magnesium: 2.1 mg/dL (ref 1.7–2.4)

## 2019-05-30 ENCOUNTER — Other Ambulatory Visit (HOSPITAL_COMMUNITY)
Admission: RE | Admit: 2019-05-30 | Discharge: 2019-05-30 | Disposition: A | Discharge status: Home / Self Care | Payer: Medicare Other | Source: Ambulatory Visit | Attending: Nephrology | Admitting: Nephrology

## 2019-05-30 DIAGNOSIS — N189 Chronic kidney disease, unspecified: Secondary | ICD-10-CM | POA: Diagnosis not present

## 2019-05-30 DIAGNOSIS — E1129 Type 2 diabetes mellitus with other diabetic kidney complication: Secondary | ICD-10-CM | POA: Diagnosis not present

## 2019-05-30 DIAGNOSIS — Z9483 Pancreas transplant status: Secondary | ICD-10-CM | POA: Diagnosis not present

## 2019-05-30 DIAGNOSIS — D899 Disorder involving the immune mechanism, unspecified: Secondary | ICD-10-CM | POA: Insufficient documentation

## 2019-05-30 DIAGNOSIS — N390 Urinary tract infection, site not specified: Secondary | ICD-10-CM | POA: Insufficient documentation

## 2019-05-30 DIAGNOSIS — Z789 Other specified health status: Secondary | ICD-10-CM | POA: Diagnosis not present

## 2019-05-30 DIAGNOSIS — D631 Anemia in chronic kidney disease: Secondary | ICD-10-CM | POA: Diagnosis not present

## 2019-05-30 DIAGNOSIS — Z114 Encounter for screening for human immunodeficiency virus [HIV]: Secondary | ICD-10-CM | POA: Insufficient documentation

## 2019-05-30 DIAGNOSIS — E1122 Type 2 diabetes mellitus with diabetic chronic kidney disease: Secondary | ICD-10-CM | POA: Insufficient documentation

## 2019-05-30 DIAGNOSIS — B259 Cytomegaloviral disease, unspecified: Secondary | ICD-10-CM | POA: Diagnosis not present

## 2019-05-30 DIAGNOSIS — Z940 Kidney transplant status: Secondary | ICD-10-CM | POA: Diagnosis not present

## 2019-05-30 DIAGNOSIS — E559 Vitamin D deficiency, unspecified: Secondary | ICD-10-CM | POA: Diagnosis not present

## 2019-05-30 DIAGNOSIS — Z79899 Other long term (current) drug therapy: Secondary | ICD-10-CM | POA: Diagnosis not present

## 2019-05-30 LAB — PHOSPHORUS
Lab: 2 — ABNORMAL LOW
Lab: 2.8
Phosphorus: 2.4 mg/dL — ABNORMAL LOW (ref 2.5–4.6)

## 2019-05-30 LAB — CBC W/ DIFFERENTIAL
BASOPHILS ABSOLUTE COUNT: 0 10*9/L
BASOPHILS ABSOLUTE COUNT: 0 10*9/L
BASOPHILS RELATIVE PERCENT: 0 %
BASOPHILS RELATIVE PERCENT: 1 %
EOSINOPHILS ABSOLUTE COUNT: 0.1 10*9/L
EOSINOPHILS ABSOLUTE COUNT: 0.1 10*9/L
EOSINOPHILS RELATIVE PERCENT: 3 %
EOSINOPHILS RELATIVE PERCENT: 5 %
HEMATOCRIT: 33 % — ABNORMAL LOW
HEMATOCRIT: 32.9 % — ABNORMAL LOW
HEMOGLOBIN: 10.5 g/dL — ABNORMAL LOW
HEMOGLOBIN: 10.1 g/dL — ABNORMAL LOW
LYMPHOCYTES ABSOLUTE COUNT: 0.1 10*9/L — ABNORMAL LOW
LYMPHOCYTES ABSOLUTE COUNT: 0.2 10*9/L — ABNORMAL LOW
LYMPHOCYTES RELATIVE PERCENT: 3 %
LYMPHOCYTES RELATIVE PERCENT: 6 %
MEAN CORPUSCULAR HEMOGLOBIN CONC: 31.8 g/dL
MEAN CORPUSCULAR HEMOGLOBIN CONC: 30.7 g/dL
MEAN CORPUSCULAR HEMOGLOBIN: 25.4 pg — ABNORMAL LOW
MEAN CORPUSCULAR HEMOGLOBIN: 25.1 pg — ABNORMAL LOW
MEAN CORPUSCULAR VOLUME: 79.9 fL — ABNORMAL LOW
MEAN CORPUSCULAR VOLUME: 81.8 fL
MONOCYTES ABSOLUTE COUNT: 0.1 10*9/L
MONOCYTES RELATIVE PERCENT: 4 %
MONOCYTES RELATIVE PERCENT: 5 %
NEUTROPHILS ABSOLUTE COUNT: 1.8 10*9/L
NEUTROPHILS RELATIVE PERCENT: 88 %
NEUTROPHILS RELATIVE PERCENT: 71 %
PLATELET COUNT: 254 10*9/L
PLATELET COUNT: 212 10*9/L
RED BLOOD CELL COUNT: 4.13 10*12/L — ABNORMAL LOW
RED BLOOD CELL COUNT: 4.02 10*12/L — ABNORMAL LOW
RED CELL DISTRIBUTION WIDTH: 16.3 % — ABNORMAL HIGH
RED CELL DISTRIBUTION WIDTH: 16.3 % — ABNORMAL HIGH
WBC ADJUSTED: 2.8 10*9/L — ABNORMAL LOW
WHITE BLOOD CELL COUNT: 2.6 10*9/L — ABNORMAL LOW

## 2019-05-30 LAB — BASIC METABOLIC PANEL
Anion gap: 7 (ref 5–15)
BLOOD UREA NITROGEN: 55 mg/dL — ABNORMAL HIGH
BUN: 30 mg/dL — ABNORMAL HIGH (ref 6–20)
CALCIUM: 11.1 mg/dL — ABNORMAL HIGH
CALCIUM: 11 mg/dL — ABNORMAL HIGH
CHLORIDE: 106 mmol/L
CHLORIDE: 110 mmol/L
CO2: 23 mmol/L
CO2: 25 mmol/L (ref 22–32)
CREATININE: 3.26 mg/dL — ABNORMAL HIGH
Calcium: 10.8 mg/dL — ABNORMAL HIGH (ref 8.9–10.3)
Chloride: 109 mmol/L (ref 98–111)
Creatinine, Ser: 3.21 mg/dL — ABNORMAL HIGH (ref 0.61–1.24)
EGFR CKD-EPI AA MALE: 22 mL/min/{1.73_m2} — ABNORMAL LOW
EGFR CKD-EPI AA MALE: 27 mL/min/{1.73_m2} — ABNORMAL LOW
GFR calc Af Amer: 28 mL/min — ABNORMAL LOW (ref >60)
GFR calc non Af Amer: 24 mL/min — ABNORMAL LOW (ref >60)
GLUCOSE RANDOM: 115 mg/dL — ABNORMAL HIGH
GLUCOSE RANDOM: 110 mg/dL — ABNORMAL HIGH
Glucose, Bld: 108 mg/dL — ABNORMAL HIGH (ref 70–99)
POTASSIUM: 4.9 mmol/L
Potassium: 5.5 mmol/L — ABNORMAL HIGH (ref 3.5–5.1)
SODIUM: 139 mmol/L
SODIUM: 139 mmol/L
Sodium: 141 mmol/L (ref 135–145)

## 2019-05-30 LAB — TACROLIMUS, TROUGH: Lab: 17.5

## 2019-05-30 LAB — SODIUM: Lab: 139

## 2019-05-30 LAB — MEAN PLATELET VOLUME: Lab: 0

## 2019-05-30 LAB — MAGNESIUM
Lab: 2.1
Lab: 2.1
Magnesium: 2.2 mg/dL (ref 1.7–2.4)

## 2019-05-30 LAB — CHLORIDE: Lab: 110

## 2019-05-30 LAB — SLIDE SCAN: Lab: 0

## 2019-05-30 LAB — CBC WITH DIFFERENTIAL/PLATELET
Abs Immature Granulocytes: 0 10*3/uL (ref 0.00–0.07)
Basophils Absolute: 0 10*3/uL (ref 0.0–0.1)
Basophils Relative: 1 %
Eosinophils Absolute: 0.1 10*3/uL (ref 0.0–0.5)
Eosinophils Relative: 3 %
HCT: 32.6 % — ABNORMAL LOW (ref 39.0–52.0)
Hemoglobin: 9.9 g/dL — ABNORMAL LOW (ref 13.0–17.0)
Lymphocytes Relative: 5 %
Lymphs Abs: 0.1 10*3/uL — ABNORMAL LOW (ref 0.7–4.0)
MCH: 25.6 pg — ABNORMAL LOW (ref 26.0–34.0)
MCHC: 30.4 g/dL (ref 30.0–36.0)
MCV: 84.2 fL (ref 80.0–100.0)
Monocytes Absolute: 0.1 10*3/uL (ref 0.1–1.0)
Monocytes Relative: 5 %
Neutro Abs: 2.2 10*3/uL (ref 1.7–7.7)
Neutrophils Relative %: 86 %
Platelets: 197 10*3/uL (ref 150–400)
RBC: 3.87 MIL/uL — ABNORMAL LOW (ref 4.22–5.81)
RDW: 16.6 % — ABNORMAL HIGH (ref 11.5–15.5)
WBC: 2.5 10*3/uL — ABNORMAL LOW (ref 4.0–10.5)
nRBC: 0 % (ref 0.0–0.2)
nRBC: 0 /100 WBC

## 2019-05-30 LAB — TACROLIMUS LEVEL: Tacrolimus (FK506) - LabCorp: 9.4 ng/mL (ref 2.0–20.0)

## 2019-05-31 LAB — PHOSPHORUS
Lab: 2.4 — ABNORMAL LOW
PHOSPHORUS: 2.4 mg/dL — ABNORMAL LOW

## 2019-05-31 LAB — CBC W/ DIFFERENTIAL
BASOPHILS ABSOLUTE COUNT: 0 10*9/L
BASOPHILS RELATIVE PERCENT: 1 %
EOSINOPHILS RELATIVE PERCENT: 3 %
HEMATOCRIT: 32.6 % — ABNORMAL LOW
HEMOGLOBIN: 9.9 g/dL — ABNORMAL LOW
LYMPHOCYTES ABSOLUTE COUNT: 0.1 10*9/L — ABNORMAL LOW
LYMPHOCYTES RELATIVE PERCENT: 5 %
MEAN CORPUSCULAR HEMOGLOBIN: 25.6 pg — ABNORMAL LOW
MEAN CORPUSCULAR VOLUME: 84.2 fL
MONOCYTES ABSOLUTE COUNT: 0.1 10*9/L
MONOCYTES RELATIVE PERCENT: 5 %
NEUTROPHILS ABSOLUTE COUNT: 2.2 10*9/L
NEUTROPHILS RELATIVE PERCENT: 86 %
PLATELET COUNT: 197 10*9/L
RED BLOOD CELL COUNT: 3.87 10*12/L — ABNORMAL LOW
RED CELL DISTRIBUTION WIDTH: 16.6 % — ABNORMAL HIGH
WBC ADJUSTED: 2.5 10*9/L — ABNORMAL LOW

## 2019-05-31 LAB — MAGNESIUM: Lab: 2.2

## 2019-05-31 LAB — BASIC METABOLIC PANEL
CALCIUM: 10.8 mg/dL — ABNORMAL HIGH
CHLORIDE: 109 mmol/L
CREATININE: 3.21 mg/dL — ABNORMAL HIGH
EGFR CKD-EPI AA MALE: 28 mL/min/{1.73_m2} — ABNORMAL LOW
GLUCOSE RANDOM: 108 mg/dL — ABNORMAL HIGH
POTASSIUM: 5.5 mmol/L — ABNORMAL HIGH
SODIUM: 141 mmol/L

## 2019-05-31 LAB — CO2: Lab: 25

## 2019-05-31 LAB — HYPERCHROMASIA: Lab: 0

## 2019-05-31 LAB — TACROLIMUS LEVEL: Tacrolimus (FK506) - LabCorp: 8.7 ng/mL (ref 2.0–20.0)

## 2019-06-01 DIAGNOSIS — Z940 Kidney transplant status: Principal | ICD-10-CM

## 2019-06-01 LAB — TACROLIMUS, TROUGH
Lab: 8.7
Lab: 9.4

## 2019-06-01 LAB — TACROLIMUS LEVEL, TROUGH: TACROLIMUS, TROUGH: 9.4 ng/mL

## 2019-06-01 NOTE — Unmapped (Signed)
[facility] Refill Coordination Note    Specialty Medication(s) to be Shipped:   Transplant: Myfortic 180mg  and atovaquone 750 mg/5 mL susp.  **Pt declined envarsus**    Other medication(s) to be shipped: tylenol, amlodipine 5mg , famotidine 20mg , lokelma 5 grams, magnesium 133mg , and sodium bicarb 650mg      Russell Wade, DOB: Dec 25, 1985  Phone: [phone] (home) [phone] (work)      All above HIPAA information was verified with patient.     Was a Nurse, learning disability used for this call? No    Completed refill call assessment today to schedule patient's medication shipment from the [facility] [phone]).       Specialty medication(s) and dose(s) confirmed: Regimen is correct and unchanged.   Changes to medications: Russell Wade reports no changes at this time.  Changes to insurance: No  Questions for the pharmacist: No    Confirmed patient received Welcome Packet with first shipment. The patient will receive a drug information handout for each medication shipped and additional FDA Medication Guides as required.       DISEASE/MEDICATION-SPECIFIC INFORMATION        N/A    SPECIALTY MEDICATION ADHERENCE     Medication Adherence    Patient reported X missed doses in the last month: 0  Specialty Medication: atovaquone 750 mg/5 mL suspension [facility])  Patient is on additional specialty medications: Yes  Additional Specialty Medications: Myfortic 180mg   Patient Reported Additional Medication X Missed Doses in the Last Month: 0  Patient is on more than two specialty medications: No          Atovaquone 750mg /13ml susp: 9 days of medicine on hand   Myfortic 180 mg: 9 days of medicine on hand     SHIPPING     Shipping address confirmed in Epic.     Delivery Scheduled: Yes, Expected medication delivery date: 06/08/2019.     Medication will be delivered via UPS to the prescription address in Epic W[facility].    Russell Wade [facility] Specialty Technician

## 2019-06-03 ENCOUNTER — Other Ambulatory Visit (HOSPITAL_COMMUNITY)
Admission: RE | Admit: 2019-06-03 | Discharge: 2019-06-03 | Disposition: A | Discharge status: Home / Self Care | Payer: Medicare Other | Source: Other Acute Inpatient Hospital | Attending: Nephrology | Admitting: Nephrology

## 2019-06-03 DIAGNOSIS — Z9483 Pancreas transplant status: Secondary | ICD-10-CM | POA: Diagnosis not present

## 2019-06-03 DIAGNOSIS — D899 Disorder involving the immune mechanism, unspecified: Secondary | ICD-10-CM | POA: Diagnosis present

## 2019-06-03 DIAGNOSIS — N390 Urinary tract infection, site not specified: Secondary | ICD-10-CM | POA: Diagnosis not present

## 2019-06-03 DIAGNOSIS — D631 Anemia in chronic kidney disease: Secondary | ICD-10-CM | POA: Insufficient documentation

## 2019-06-03 DIAGNOSIS — T8610 Unspecified complication of kidney transplant: Secondary | ICD-10-CM | POA: Insufficient documentation

## 2019-06-03 DIAGNOSIS — E559 Vitamin D deficiency, unspecified: Secondary | ICD-10-CM | POA: Insufficient documentation

## 2019-06-03 DIAGNOSIS — Z114 Encounter for screening for human immunodeficiency virus [HIV]: Secondary | ICD-10-CM | POA: Insufficient documentation

## 2019-06-03 DIAGNOSIS — Z789 Other specified health status: Secondary | ICD-10-CM | POA: Insufficient documentation

## 2019-06-03 DIAGNOSIS — Z79899 Other long term (current) drug therapy: Secondary | ICD-10-CM | POA: Insufficient documentation

## 2019-06-03 DIAGNOSIS — N189 Chronic kidney disease, unspecified: Secondary | ICD-10-CM | POA: Diagnosis not present

## 2019-06-03 DIAGNOSIS — B259 Cytomegaloviral disease, unspecified: Secondary | ICD-10-CM | POA: Diagnosis not present

## 2019-06-03 DIAGNOSIS — Z940 Kidney transplant status: Secondary | ICD-10-CM | POA: Diagnosis not present

## 2019-06-03 DIAGNOSIS — E1129 Type 2 diabetes mellitus with other diabetic kidney complication: Secondary | ICD-10-CM | POA: Diagnosis not present

## 2019-06-03 LAB — BASIC METABOLIC PANEL
Anion gap: 8 (ref 5–15)
BUN: 29 mg/dL — ABNORMAL HIGH (ref 6–20)
CO2: 24 mmol/L (ref 22–32)
Calcium: 11.3 mg/dL — ABNORMAL HIGH (ref 8.9–10.3)
Chloride: 107 mmol/L (ref 98–111)
Creatinine, Ser: 3.19 mg/dL — ABNORMAL HIGH (ref 0.61–1.24)
GFR calc Af Amer: 28 mL/min — ABNORMAL LOW (ref >60)
GFR calc non Af Amer: 24 mL/min — ABNORMAL LOW (ref >60)
Glucose, Bld: 130 mg/dL — ABNORMAL HIGH (ref 70–99)
Potassium: 5.7 mmol/L — ABNORMAL HIGH (ref 3.5–5.1)
Sodium: 139 mmol/L (ref 135–145)

## 2019-06-03 LAB — CBC WITH DIFFERENTIAL/PLATELET
Abs Immature Granulocytes: 0.21 10*3/uL — ABNORMAL HIGH (ref 0.00–0.07)
Basophils Absolute: 0 10*3/uL (ref 0.0–0.1)
Basophils Relative: 2 %
Eosinophils Absolute: 0.1 10*3/uL (ref 0.0–0.5)
Eosinophils Relative: 5 %
HCT: 32.6 % — ABNORMAL LOW (ref 39.0–52.0)
Hemoglobin: 10.1 g/dL — ABNORMAL LOW (ref 13.0–17.0)
Immature Granulocytes: 10 %
Lymphocytes Relative: 10 %
Lymphs Abs: 0.2 10*3/uL — ABNORMAL LOW (ref 0.7–4.0)
MCH: 25.6 pg — ABNORMAL LOW (ref 26.0–34.0)
MCHC: 31 g/dL (ref 30.0–36.0)
MCV: 82.7 fL (ref 80.0–100.0)
Monocytes Absolute: 0.2 10*3/uL (ref 0.1–1.0)
Monocytes Relative: 7 %
Neutro Abs: 1.5 10*3/uL — ABNORMAL LOW (ref 1.7–7.7)
Neutrophils Relative %: 66 %
Platelets: 195 10*3/uL (ref 150–400)
RBC: 3.94 MIL/uL — ABNORMAL LOW (ref 4.22–5.81)
RDW: 16.4 % — ABNORMAL HIGH (ref 11.5–15.5)
WBC: 2.2 10*3/uL — ABNORMAL LOW (ref 4.0–10.5)
nRBC: 0 % (ref 0.0–0.2)

## 2019-06-03 LAB — MAGNESIUM: Magnesium: 2.1 mg/dL (ref 1.7–2.4)

## 2019-06-03 LAB — PHOSPHORUS: Phosphorus: 2.4 mg/dL — ABNORMAL LOW (ref 2.5–4.6)

## 2019-06-04 LAB — TACROLIMUS LEVEL: Tacrolimus (FK506) - LabCorp: 10 ng/mL (ref 2.0–20.0)

## 2019-06-06 ENCOUNTER — Other Ambulatory Visit (HOSPITAL_COMMUNITY)
Admission: RE | Admit: 2019-06-06 | Discharge: 2019-06-06 | Disposition: A | Discharge status: Home / Self Care | Payer: Medicare Other | Source: Ambulatory Visit | Attending: Nephrology | Admitting: Nephrology

## 2019-06-06 DIAGNOSIS — D631 Anemia in chronic kidney disease: Secondary | ICD-10-CM | POA: Insufficient documentation

## 2019-06-06 DIAGNOSIS — Z114 Encounter for screening for human immunodeficiency virus [HIV]: Secondary | ICD-10-CM | POA: Diagnosis not present

## 2019-06-06 DIAGNOSIS — N390 Urinary tract infection, site not specified: Secondary | ICD-10-CM | POA: Insufficient documentation

## 2019-06-06 DIAGNOSIS — Z9483 Pancreas transplant status: Secondary | ICD-10-CM | POA: Diagnosis not present

## 2019-06-06 DIAGNOSIS — E1129 Type 2 diabetes mellitus with other diabetic kidney complication: Secondary | ICD-10-CM | POA: Diagnosis not present

## 2019-06-06 DIAGNOSIS — Z940 Kidney transplant status: Secondary | ICD-10-CM | POA: Insufficient documentation

## 2019-06-06 DIAGNOSIS — D899 Disorder involving the immune mechanism, unspecified: Secondary | ICD-10-CM | POA: Diagnosis present

## 2019-06-06 DIAGNOSIS — N189 Chronic kidney disease, unspecified: Secondary | ICD-10-CM | POA: Diagnosis not present

## 2019-06-06 DIAGNOSIS — B259 Cytomegaloviral disease, unspecified: Secondary | ICD-10-CM | POA: Insufficient documentation

## 2019-06-06 DIAGNOSIS — Z789 Other specified health status: Secondary | ICD-10-CM | POA: Insufficient documentation

## 2019-06-06 DIAGNOSIS — Z79899 Other long term (current) drug therapy: Secondary | ICD-10-CM | POA: Diagnosis not present

## 2019-06-06 DIAGNOSIS — E1122 Type 2 diabetes mellitus with diabetic chronic kidney disease: Secondary | ICD-10-CM | POA: Diagnosis not present

## 2019-06-06 DIAGNOSIS — E559 Vitamin D deficiency, unspecified: Secondary | ICD-10-CM | POA: Insufficient documentation

## 2019-06-06 LAB — PHOSPHORUS
Lab: 2.4 — ABNORMAL LOW
Lab: 2.8
Phosphorus: 2.8 mg/dL (ref 2.5–4.6)

## 2019-06-06 LAB — CBC W/ DIFFERENTIAL
BASOPHILS ABSOLUTE COUNT: 0 10*9/L
BASOPHILS ABSOLUTE COUNT: 0.1 10*9/L
BASOPHILS RELATIVE PERCENT: 2 %
EOSINOPHILS ABSOLUTE COUNT: 0.1 10*9/L
EOSINOPHILS ABSOLUTE COUNT: 0 10*9/L
EOSINOPHILS RELATIVE PERCENT: 5 %
EOSINOPHILS RELATIVE PERCENT: 2 %
HEMATOCRIT: 32.6 % — ABNORMAL LOW
HEMOGLOBIN: 10.1 g/dL — ABNORMAL LOW
HEMOGLOBIN: 10 g/dL — ABNORMAL LOW
LYMPHOCYTES ABSOLUTE COUNT: 0.2 10*9/L — ABNORMAL LOW
LYMPHOCYTES ABSOLUTE COUNT: 0.1 10*9/L — ABNORMAL LOW
LYMPHOCYTES RELATIVE PERCENT: 10 %
LYMPHOCYTES RELATIVE PERCENT: 6 %
MEAN CORPUSCULAR HEMOGLOBIN CONC: 31 g/dL
MEAN CORPUSCULAR HEMOGLOBIN CONC: 31.6 g/dL
MEAN CORPUSCULAR HEMOGLOBIN: 25.6 pg — ABNORMAL LOW
MEAN CORPUSCULAR HEMOGLOBIN: 26 pg
MEAN CORPUSCULAR VOLUME: 82.7 fL
MEAN CORPUSCULAR VOLUME: 82.3 fL
MONOCYTES ABSOLUTE COUNT: 0.2 10*9/L
MONOCYTES ABSOLUTE COUNT: 0.1 10*9/L
MONOCYTES RELATIVE PERCENT: 7 %
MONOCYTES RELATIVE PERCENT: 4 %
NEUTROPHILS ABSOLUTE COUNT: 1.5 10*9/L — ABNORMAL LOW
NEUTROPHILS ABSOLUTE COUNT: 1.9 10*9/L
NEUTROPHILS RELATIVE PERCENT: 66 %
NEUTROPHILS RELATIVE PERCENT: 84 %
PLATELET COUNT: 195 10*9/L
PLATELET COUNT: 182 10*9/L
RED BLOOD CELL COUNT: 3.94 10*12/L — ABNORMAL LOW
RED BLOOD CELL COUNT: 3.84 10*12/L — ABNORMAL LOW
RED CELL DISTRIBUTION WIDTH: 16.4 % — ABNORMAL HIGH
RED CELL DISTRIBUTION WIDTH: 16.8 % — ABNORMAL HIGH
WBC ADJUSTED: 2.2 10*9/L — ABNORMAL LOW
WHITE BLOOD CELL COUNT: 2.3 10*9/L — ABNORMAL LOW

## 2019-06-06 LAB — BASIC METABOLIC PANEL
Anion gap: 6 (ref 5–15)
BLOOD UREA NITROGEN: 29 mg/dL — ABNORMAL HIGH
BLOOD UREA NITROGEN: 34 mg/dL — ABNORMAL HIGH
BUN: 34 mg/dL — ABNORMAL HIGH (ref 6–20)
CHLORIDE: 107 mmol/L
CHLORIDE: 108 mmol/L
CO2: 24 mmol/L
CO2: 24 mmol/L
CO2: 24 mmol/L (ref 22–32)
CREATININE: 3.19 mg/dL — ABNORMAL HIGH
CREATININE: 3.39 mg/dL — ABNORMAL HIGH
Calcium: 10.6 mg/dL — ABNORMAL HIGH (ref 8.9–10.3)
Chloride: 108 mmol/L (ref 98–111)
Creatinine, Ser: 3.39 mg/dL — ABNORMAL HIGH (ref 0.61–1.24)
EGFR CKD-EPI AA MALE: 28 mL/min/{1.73_m2} — ABNORMAL LOW
GFR calc Af Amer: 26 mL/min — ABNORMAL LOW (ref >60)
GFR calc non Af Amer: 23 mL/min — ABNORMAL LOW (ref >60)
GLUCOSE RANDOM: 130 mg/dL — ABNORMAL HIGH
GLUCOSE RANDOM: 118 mg/dL — ABNORMAL HIGH
Glucose, Bld: 118 mg/dL — ABNORMAL HIGH (ref 70–99)
POTASSIUM: 5.7 mmol/L — ABNORMAL HIGH
POTASSIUM: 5.9 mmol/L — ABNORMAL HIGH
Potassium: 5.9 mmol/L — ABNORMAL HIGH (ref 3.5–5.1)
SODIUM: 138 mmol/L
SODIUM: 138 mmol/L
Sodium: 138 mmol/L (ref 135–145)

## 2019-06-06 LAB — EGFR CKD-EPI NON-AA FEMALE: Lab: 0

## 2019-06-06 LAB — MAGNESIUM
Lab: 2.1
Lab: 2.1
Magnesium: 2.1 mg/dL (ref 1.7–2.4)

## 2019-06-06 LAB — SODIUM: Lab: 138

## 2019-06-06 LAB — NUCLEATED RED BLOOD CELLS: Lab: 0

## 2019-06-06 LAB — TACROLIMUS, TROUGH: Lab: 10

## 2019-06-06 LAB — MONOCYTES RELATIVE PERCENT: Lab: 7

## 2019-06-06 LAB — CBC WITH DIFFERENTIAL/PLATELET
Abs Immature Granulocytes: 0 10*3/uL (ref 0.00–0.07)
Basophils Absolute: 0.1 10*3/uL (ref 0.0–0.1)
Basophils Relative: 4 %
Eosinophils Absolute: 0 10*3/uL (ref 0.0–0.5)
Eosinophils Relative: 2 %
HCT: 31.6 % — ABNORMAL LOW (ref 39.0–52.0)
Hemoglobin: 10 g/dL — ABNORMAL LOW (ref 13.0–17.0)
Lymphocytes Relative: 6 %
Lymphs Abs: 0.1 10*3/uL — ABNORMAL LOW (ref 0.7–4.0)
MCH: 26 pg (ref 26.0–34.0)
MCHC: 31.6 g/dL (ref 30.0–36.0)
MCV: 82.3 fL (ref 80.0–100.0)
Monocytes Absolute: 0.1 10*3/uL (ref 0.1–1.0)
Monocytes Relative: 4 %
Neutro Abs: 1.9 10*3/uL (ref 1.7–7.7)
Neutrophils Relative %: 84 %
Platelets: 182 10*3/uL (ref 150–400)
RBC: 3.84 MIL/uL — ABNORMAL LOW (ref 4.22–5.81)
RDW: 16.8 % — ABNORMAL HIGH (ref 11.5–15.5)
WBC: 2.3 10*3/uL — ABNORMAL LOW (ref 4.0–10.5)
nRBC: 0 % (ref 0.0–0.2)
nRBC: 0 /100 WBC

## 2019-06-07 LAB — TACROLIMUS, TROUGH: Lab: 8.4

## 2019-06-07 LAB — TACROLIMUS LEVEL: Tacrolimus (FK506) - LabCorp: 8.4 ng/mL (ref 2.0–20.0)

## 2019-06-07 MED FILL — ACETAMINOPHEN 500 MG TABLET: ORAL | 13 days supply | Qty: 100 | Fill #2

## 2019-06-07 MED FILL — SODIUM BICARBONATE 650 MG TABLET: ORAL | 30 days supply | Qty: 120 | Fill #2

## 2019-06-07 MED FILL — MG-PLUS-PROTEIN 133 MG TABLET: 30 days supply | Qty: 30 | Fill #0 | Status: AC

## 2019-06-07 MED FILL — MYFORTIC 180 MG TABLET,DELAYED RELEASE: ORAL | 30 days supply | Qty: 120 | Fill #1

## 2019-06-07 MED FILL — ACETAMINOPHEN 500 MG TABLET: 13 days supply | Qty: 100 | Fill #2 | Status: AC

## 2019-06-07 MED FILL — ATOVAQUONE 750 MG/5 ML ORAL SUSPENSION: 30 days supply | Qty: 300 | Fill #1 | Status: AC

## 2019-06-07 MED FILL — FAMOTIDINE 20 MG TABLET: ORAL | 30 days supply | Qty: 30 | Fill #1

## 2019-06-07 MED FILL — AMLODIPINE 5 MG TABLET: 30 days supply | Qty: 60 | Fill #2 | Status: AC

## 2019-06-07 MED FILL — LOKELMA 5 GRAM ORAL POWDER PACKET: 30 days supply | Qty: 30 | Fill #1 | Status: AC

## 2019-06-07 MED FILL — AMLODIPINE 5 MG TABLET: ORAL | 30 days supply | Qty: 60 | Fill #2

## 2019-06-07 MED FILL — SODIUM BICARBONATE 650 MG TABLET: 30 days supply | Qty: 120 | Fill #2 | Status: AC

## 2019-06-07 MED FILL — LOKELMA 5 GRAM ORAL POWDER PACKET: ORAL | 30 days supply | Qty: 30 | Fill #1

## 2019-06-07 MED FILL — ATOVAQUONE 750 MG/5 ML ORAL SUSPENSION: ORAL | 30 days supply | Qty: 300 | Fill #1

## 2019-06-07 MED FILL — MYFORTIC 180 MG TABLET,DELAYED RELEASE: 30 days supply | Qty: 120 | Fill #1 | Status: AC

## 2019-06-07 MED FILL — FAMOTIDINE 20 MG TABLET: 30 days supply | Qty: 30 | Fill #1 | Status: AC

## 2019-06-13 ENCOUNTER — Other Ambulatory Visit (HOSPITAL_COMMUNITY)
Admission: RE | Admit: 2019-06-13 | Discharge: 2019-06-13 | Disposition: A | Discharge status: Home / Self Care | Payer: Medicare Other | Source: Ambulatory Visit | Attending: Nephrology | Admitting: Nephrology

## 2019-06-13 DIAGNOSIS — Z9483 Pancreas transplant status: Secondary | ICD-10-CM | POA: Diagnosis not present

## 2019-06-13 DIAGNOSIS — B259 Cytomegaloviral disease, unspecified: Secondary | ICD-10-CM | POA: Diagnosis present

## 2019-06-13 DIAGNOSIS — Z79899 Other long term (current) drug therapy: Secondary | ICD-10-CM | POA: Diagnosis not present

## 2019-06-13 DIAGNOSIS — E1129 Type 2 diabetes mellitus with other diabetic kidney complication: Secondary | ICD-10-CM | POA: Insufficient documentation

## 2019-06-13 DIAGNOSIS — D631 Anemia in chronic kidney disease: Secondary | ICD-10-CM | POA: Diagnosis not present

## 2019-06-13 DIAGNOSIS — D899 Disorder involving the immune mechanism, unspecified: Secondary | ICD-10-CM | POA: Insufficient documentation

## 2019-06-13 DIAGNOSIS — N390 Urinary tract infection, site not specified: Secondary | ICD-10-CM | POA: Diagnosis not present

## 2019-06-13 DIAGNOSIS — Z940 Kidney transplant status: Secondary | ICD-10-CM | POA: Diagnosis not present

## 2019-06-13 DIAGNOSIS — Z789 Other specified health status: Secondary | ICD-10-CM | POA: Diagnosis not present

## 2019-06-13 DIAGNOSIS — E559 Vitamin D deficiency, unspecified: Secondary | ICD-10-CM | POA: Insufficient documentation

## 2019-06-13 DIAGNOSIS — Z09 Encounter for follow-up examination after completed treatment for conditions other than malignant neoplasm: Secondary | ICD-10-CM | POA: Insufficient documentation

## 2019-06-13 DIAGNOSIS — Z114 Encounter for screening for human immunodeficiency virus [HIV]: Secondary | ICD-10-CM | POA: Diagnosis not present

## 2019-06-13 LAB — CBC WITH DIFFERENTIAL/PLATELET
Abs Immature Granulocytes: 0 10*3/uL (ref 0.00–0.07)
Basophils Absolute: 0.1 10*3/uL (ref 0.0–0.1)
Basophils Relative: 3 %
Eosinophils Absolute: 0.1 10*3/uL (ref 0.0–0.5)
Eosinophils Relative: 3 %
HCT: 31.7 % — ABNORMAL LOW (ref 39.0–52.0)
Hemoglobin: 9.9 g/dL — ABNORMAL LOW (ref 13.0–17.0)
Lymphocytes Relative: 4 %
Lymphs Abs: 0.1 10*3/uL — ABNORMAL LOW (ref 0.7–4.0)
MCH: 25.6 pg — ABNORMAL LOW (ref 26.0–34.0)
MCHC: 31.2 g/dL (ref 30.0–36.0)
MCV: 81.9 fL (ref 80.0–100.0)
Monocytes Absolute: 0.3 10*3/uL (ref 0.1–1.0)
Monocytes Relative: 12 %
Neutro Abs: 2 10*3/uL (ref 1.7–7.7)
Neutrophils Relative %: 78 %
Platelets: 161 10*3/uL (ref 150–400)
RBC: 3.87 MIL/uL — ABNORMAL LOW (ref 4.22–5.81)
RDW: 16.3 % — ABNORMAL HIGH (ref 11.5–15.5)
WBC: 2.6 10*3/uL — ABNORMAL LOW (ref 4.0–10.5)
nRBC: 0 % (ref 0.0–0.2)
nRBC: 0 /100 WBC

## 2019-06-13 LAB — BASIC METABOLIC PANEL
Anion gap: 7 (ref 5–15)
BUN: 24 mg/dL — ABNORMAL HIGH (ref 6–20)
CO2: 24 mmol/L (ref 22–32)
Calcium: 10.6 mg/dL — ABNORMAL HIGH (ref 8.9–10.3)
Chloride: 106 mmol/L (ref 98–111)
Creatinine, Ser: 2.74 mg/dL — ABNORMAL HIGH (ref 0.61–1.24)
GFR calc Af Amer: 34 mL/min — ABNORMAL LOW (ref >60)
GFR calc non Af Amer: 29 mL/min — ABNORMAL LOW (ref >60)
Glucose, Bld: 115 mg/dL — ABNORMAL HIGH (ref 70–99)
Potassium: 5.2 mmol/L — ABNORMAL HIGH (ref 3.5–5.1)
Sodium: 137 mmol/L (ref 135–145)

## 2019-06-13 LAB — PHOSPHORUS: Phosphorus: 2.7 mg/dL (ref 2.5–4.6)

## 2019-06-13 LAB — MAGNESIUM: Magnesium: 2 mg/dL (ref 1.7–2.4)

## 2019-06-14 ENCOUNTER — Ambulatory Visit: Admit: 2019-06-14 | Discharge: 2019-06-14 | Payer: MEDICARE

## 2019-06-14 DIAGNOSIS — E559 Vitamin D deficiency, unspecified: Principal | ICD-10-CM

## 2019-06-14 DIAGNOSIS — Z940 Kidney transplant status: Principal | ICD-10-CM

## 2019-06-14 DIAGNOSIS — R8279 Other abnormal findings on microbiological examination of urine: Principal | ICD-10-CM

## 2019-06-14 LAB — GLUCOSE RANDOM: Lab: 115 — ABNORMAL HIGH

## 2019-06-14 LAB — TACROLIMUS, TROUGH
Lab: 0
Lab: 5.7

## 2019-06-14 LAB — COMPREHENSIVE METABOLIC PANEL
BLOOD UREA NITROGEN: 24 mg/dL — ABNORMAL HIGH
CALCIUM: 10.6 mg/dL — ABNORMAL HIGH
CHLORIDE: 106 mmol/L
CO2: 24 mmol/L
CREATININE: 2.74 mg/dL — ABNORMAL HIGH
EGFR CKD-EPI AA MALE: 34 mL/min/{1.73_m2} — ABNORMAL LOW
GLUCOSE RANDOM: 115 mg/dL — ABNORMAL HIGH

## 2019-06-14 LAB — CBC W/ DIFFERENTIAL
BASOPHILS ABSOLUTE COUNT: 0.1 10*9/L
BASOPHILS RELATIVE PERCENT: 3 %
EOSINOPHILS ABSOLUTE COUNT: 0.1 10*9/L
EOSINOPHILS RELATIVE PERCENT: 3 %
HEMATOCRIT: 31.7 % — ABNORMAL LOW
HEMOGLOBIN: 9.9 g/dL — ABNORMAL LOW
LYMPHOCYTES ABSOLUTE COUNT: 0.1 10*9/L — ABNORMAL LOW
LYMPHOCYTES RELATIVE PERCENT: 4 %
MEAN CORPUSCULAR HEMOGLOBIN: 25.6 pg — ABNORMAL LOW
MEAN CORPUSCULAR VOLUME: 81.9 fL
MONOCYTES ABSOLUTE COUNT: 0.3 10*9/L
NEUTROPHILS ABSOLUTE COUNT: 2 10*9/L
NEUTROPHILS RELATIVE PERCENT: 78 %
PLATELET COUNT: 161 10*9/L
RED BLOOD CELL COUNT: 3.87 10*12/L — ABNORMAL LOW
RED CELL DISTRIBUTION WIDTH: 16.3 % — ABNORMAL HIGH
WBC ADJUSTED: 2.6 10*9/L — ABNORMAL LOW

## 2019-06-14 LAB — MAGNESIUM: Lab: 2

## 2019-06-14 LAB — BASOPHILS RELATIVE PERCENT: Lab: 3

## 2019-06-14 LAB — PHOSPHORUS: Lab: 2.7

## 2019-06-14 LAB — TACROLIMUS LEVEL: Tacrolimus (FK506) - LabCorp: 5.7 ng/mL (ref 2.0–20.0)

## 2019-06-14 NOTE — Unmapped (Addendum)
Potassium  level 5.9; Advised pt on 12/22 to increase Lokelma to 10 mg TID for one day and resume normal 5 mg daily dose thereafter per Dr Gwynneth Munson.

## 2019-06-14 NOTE — Unmapped (Signed)
No show to clinic today. Can see him at next visit

## 2019-06-14 NOTE — Unmapped (Signed)
Left patient a voice message to return my call concerning his appointment today.

## 2019-06-16 ENCOUNTER — Other Ambulatory Visit (HOSPITAL_COMMUNITY)
Admission: RE | Admit: 2019-06-16 | Discharge: 2019-06-16 | Disposition: A | Discharge status: Home / Self Care | Payer: Medicare Other | Source: Other Acute Inpatient Hospital | Attending: Nephrology | Admitting: Nephrology

## 2019-06-16 DIAGNOSIS — B259 Cytomegaloviral disease, unspecified: Secondary | ICD-10-CM | POA: Diagnosis not present

## 2019-06-16 DIAGNOSIS — E559 Vitamin D deficiency, unspecified: Secondary | ICD-10-CM | POA: Insufficient documentation

## 2019-06-16 DIAGNOSIS — E1129 Type 2 diabetes mellitus with other diabetic kidney complication: Secondary | ICD-10-CM | POA: Insufficient documentation

## 2019-06-16 DIAGNOSIS — Z940 Kidney transplant status: Secondary | ICD-10-CM | POA: Diagnosis not present

## 2019-06-16 DIAGNOSIS — Z79899 Other long term (current) drug therapy: Secondary | ICD-10-CM | POA: Insufficient documentation

## 2019-06-16 DIAGNOSIS — Z9483 Pancreas transplant status: Secondary | ICD-10-CM | POA: Insufficient documentation

## 2019-06-16 DIAGNOSIS — T8610 Unspecified complication of kidney transplant: Secondary | ICD-10-CM | POA: Insufficient documentation

## 2019-06-16 DIAGNOSIS — Z114 Encounter for screening for human immunodeficiency virus [HIV]: Secondary | ICD-10-CM | POA: Diagnosis not present

## 2019-06-16 DIAGNOSIS — N189 Chronic kidney disease, unspecified: Secondary | ICD-10-CM | POA: Diagnosis not present

## 2019-06-16 DIAGNOSIS — D631 Anemia in chronic kidney disease: Secondary | ICD-10-CM | POA: Insufficient documentation

## 2019-06-16 DIAGNOSIS — Z789 Other specified health status: Secondary | ICD-10-CM | POA: Diagnosis not present

## 2019-06-16 DIAGNOSIS — D899 Disorder involving the immune mechanism, unspecified: Secondary | ICD-10-CM | POA: Diagnosis present

## 2019-06-16 DIAGNOSIS — N390 Urinary tract infection, site not specified: Secondary | ICD-10-CM | POA: Diagnosis not present

## 2019-06-16 LAB — CBC WITH DIFFERENTIAL/PLATELET
Abs Immature Granulocytes: 0.29 10*3/uL — ABNORMAL HIGH (ref 0.00–0.07)
Basophils Absolute: 0 10*3/uL (ref 0.0–0.1)
Basophils Relative: 1 %
Eosinophils Absolute: 0.1 10*3/uL (ref 0.0–0.5)
Eosinophils Relative: 3 %
HCT: 34.6 % — ABNORMAL LOW (ref 39.0–52.0)
Hemoglobin: 10.6 g/dL — ABNORMAL LOW (ref 13.0–17.0)
Immature Granulocytes: 9 %
Lymphocytes Relative: 6 %
Lymphs Abs: 0.2 10*3/uL — ABNORMAL LOW (ref 0.7–4.0)
MCH: 25.5 pg — ABNORMAL LOW (ref 26.0–34.0)
MCHC: 30.6 g/dL (ref 30.0–36.0)
MCV: 83.2 fL (ref 80.0–100.0)
Monocytes Absolute: 0.5 10*3/uL (ref 0.1–1.0)
Monocytes Relative: 14 %
Neutro Abs: 2.2 10*3/uL (ref 1.7–7.7)
Neutrophils Relative %: 67 %
Platelets: 179 10*3/uL (ref 150–400)
RBC: 4.16 MIL/uL — ABNORMAL LOW (ref 4.22–5.81)
RDW: 16.4 % — ABNORMAL HIGH (ref 11.5–15.5)
WBC: 3.3 10*3/uL — ABNORMAL LOW (ref 4.0–10.5)
nRBC: 0 % (ref 0.0–0.2)

## 2019-06-16 LAB — BASIC METABOLIC PANEL
Anion gap: 7 (ref 5–15)
BUN: 21 mg/dL — ABNORMAL HIGH (ref 6–20)
CO2: 23 mmol/L (ref 22–32)
Calcium: 11.3 mg/dL — ABNORMAL HIGH (ref 8.9–10.3)
Chloride: 109 mmol/L (ref 98–111)
Creatinine, Ser: 2.8 mg/dL — ABNORMAL HIGH (ref 0.61–1.24)
GFR calc Af Amer: 33 mL/min — ABNORMAL LOW (ref >60)
GFR calc non Af Amer: 28 mL/min — ABNORMAL LOW (ref >60)
Glucose, Bld: 105 mg/dL — ABNORMAL HIGH (ref 70–99)
Potassium: 5.7 mmol/L — ABNORMAL HIGH (ref 3.5–5.1)
Sodium: 139 mmol/L (ref 135–145)

## 2019-06-16 LAB — MAGNESIUM: Magnesium: 1.9 mg/dL (ref 1.7–2.4)

## 2019-06-16 LAB — PHOSPHORUS: Phosphorus: 2.5 mg/dL (ref 2.5–4.6)

## 2019-06-19 LAB — TACROLIMUS LEVEL: Tacrolimus (FK506) - LabCorp: 6.3 ng/mL (ref 2.0–20.0)

## 2019-06-20 LAB — CBC W/ DIFFERENTIAL
BASOPHILS ABSOLUTE COUNT: 0 10*9/L
BASOPHILS RELATIVE PERCENT: 1 %
EOSINOPHILS ABSOLUTE COUNT: 0.1 10*9/L
EOSINOPHILS RELATIVE PERCENT: 3 %
HEMATOCRIT: 34.6 % — ABNORMAL LOW
HEMOGLOBIN: 10.6 g/dL — ABNORMAL LOW
LYMPHOCYTES ABSOLUTE COUNT: 0.2 10*9/L — ABNORMAL LOW
LYMPHOCYTES RELATIVE PERCENT: 6 %
MEAN CORPUSCULAR HEMOGLOBIN CONC: 30.6 g/dL
MEAN CORPUSCULAR HEMOGLOBIN: 25.5 pg — ABNORMAL LOW
MEAN CORPUSCULAR VOLUME: 83.2 fL
MONOCYTES ABSOLUTE COUNT: 0.5 10*9/L
MONOCYTES RELATIVE PERCENT: 14 %
NEUTROPHILS ABSOLUTE COUNT: 2.2 10*9/L
NEUTROPHILS RELATIVE PERCENT: 67 %
PLATELET COUNT: 179 10*9/L
RED BLOOD CELL COUNT: 4.16 10*12/L — ABNORMAL LOW
RED CELL DISTRIBUTION WIDTH: 16.4 % — ABNORMAL HIGH
WHITE BLOOD CELL COUNT: 3.3 10*9/L — ABNORMAL LOW

## 2019-06-20 LAB — MAGNESIUM: Lab: 1.9

## 2019-06-20 LAB — BASIC METABOLIC PANEL
CALCIUM: 11.3 mg/dL — ABNORMAL HIGH
CHLORIDE: 109 mmol/L
CO2: 23 mmol/L
CREATININE: 2.8 mg/dL — ABNORMAL HIGH
EGFR CKD-EPI AA MALE: 33 mL/min/{1.73_m2} — ABNORMAL LOW
GLUCOSE RANDOM: 105 mg/dL — ABNORMAL HIGH

## 2019-06-20 LAB — CREATININE: Lab: 2.8 — ABNORMAL HIGH

## 2019-06-20 LAB — PHOSPHORUS: Lab: 2.5

## 2019-06-20 LAB — BASOPHILS ABSOLUTE COUNT: Lab: 0

## 2019-06-20 LAB — TACROLIMUS, TROUGH: Lab: 6.3

## 2019-06-20 NOTE — Unmapped (Signed)
Patient denied Envarsus 1mg  refills. Patient stated that they have over 3 weeks worth of medication on hand. Moving specialty refill call to appropriate date.

## 2019-06-22 ENCOUNTER — Other Ambulatory Visit (HOSPITAL_COMMUNITY)
Admission: AD | Admit: 2019-06-22 | Discharge: 2019-06-22 | Disposition: A | Discharge status: Home / Self Care | Payer: Medicare Other | Source: Ambulatory Visit | Attending: Nephrology | Admitting: Nephrology

## 2019-06-22 DIAGNOSIS — Z114 Encounter for screening for human immunodeficiency virus [HIV]: Secondary | ICD-10-CM | POA: Insufficient documentation

## 2019-06-22 DIAGNOSIS — E559 Vitamin D deficiency, unspecified: Secondary | ICD-10-CM | POA: Insufficient documentation

## 2019-06-22 DIAGNOSIS — Z79899 Other long term (current) drug therapy: Secondary | ICD-10-CM | POA: Diagnosis not present

## 2019-06-22 DIAGNOSIS — Z789 Other specified health status: Secondary | ICD-10-CM | POA: Insufficient documentation

## 2019-06-22 DIAGNOSIS — T8610 Unspecified complication of kidney transplant: Secondary | ICD-10-CM | POA: Diagnosis not present

## 2019-06-22 DIAGNOSIS — N390 Urinary tract infection, site not specified: Secondary | ICD-10-CM | POA: Diagnosis not present

## 2019-06-22 DIAGNOSIS — B259 Cytomegaloviral disease, unspecified: Secondary | ICD-10-CM | POA: Insufficient documentation

## 2019-06-22 DIAGNOSIS — E1129 Type 2 diabetes mellitus with other diabetic kidney complication: Secondary | ICD-10-CM | POA: Insufficient documentation

## 2019-06-22 DIAGNOSIS — Z940 Kidney transplant status: Secondary | ICD-10-CM | POA: Diagnosis not present

## 2019-06-22 DIAGNOSIS — D899 Disorder involving the immune mechanism, unspecified: Secondary | ICD-10-CM | POA: Insufficient documentation

## 2019-06-22 DIAGNOSIS — N189 Chronic kidney disease, unspecified: Secondary | ICD-10-CM | POA: Diagnosis not present

## 2019-06-22 DIAGNOSIS — D631 Anemia in chronic kidney disease: Secondary | ICD-10-CM | POA: Insufficient documentation

## 2019-06-22 DIAGNOSIS — Z9483 Pancreas transplant status: Secondary | ICD-10-CM | POA: Insufficient documentation

## 2019-06-22 LAB — CBC WITH DIFFERENTIAL/PLATELET
Abs Immature Granulocytes: 0.22 10*3/uL — ABNORMAL HIGH (ref 0.00–0.07)
Basophils Absolute: 0 10*3/uL (ref 0.0–0.1)
Basophils Relative: 1 %
Eosinophils Absolute: 0.2 10*3/uL (ref 0.0–0.5)
Eosinophils Relative: 8 %
HCT: 31.8 % — ABNORMAL LOW (ref 39.0–52.0)
Hemoglobin: 10.1 g/dL — ABNORMAL LOW (ref 13.0–17.0)
Immature Granulocytes: 10 %
Lymphocytes Relative: 5 %
Lymphs Abs: 0.1 10*3/uL — ABNORMAL LOW (ref 0.7–4.0)
MCH: 25.9 pg — ABNORMAL LOW (ref 26.0–34.0)
MCHC: 31.8 g/dL (ref 30.0–36.0)
MCV: 81.5 fL (ref 80.0–100.0)
Monocytes Absolute: 0.4 10*3/uL (ref 0.1–1.0)
Monocytes Relative: 16 %
Neutro Abs: 1.4 10*3/uL — ABNORMAL LOW (ref 1.7–7.7)
Neutrophils Relative %: 60 %
Platelets: 162 10*3/uL (ref 150–400)
RBC: 3.9 MIL/uL — ABNORMAL LOW (ref 4.22–5.81)
RDW: 16.3 % — ABNORMAL HIGH (ref 11.5–15.5)
WBC: 2.3 10*3/uL — ABNORMAL LOW (ref 4.0–10.5)
nRBC: 0 % (ref 0.0–0.2)

## 2019-06-22 LAB — BASIC METABOLIC PANEL
Anion gap: 9 (ref 5–15)
BUN: 42 mg/dL — ABNORMAL HIGH (ref 6–20)
CO2: 23 mmol/L (ref 22–32)
Calcium: 10.5 mg/dL — ABNORMAL HIGH (ref 8.9–10.3)
Chloride: 107 mmol/L (ref 98–111)
Creatinine, Ser: 2.89 mg/dL — ABNORMAL HIGH (ref 0.61–1.24)
GFR calc Af Amer: 32 mL/min — ABNORMAL LOW (ref >60)
GFR calc non Af Amer: 27 mL/min — ABNORMAL LOW (ref >60)
Glucose, Bld: 112 mg/dL — ABNORMAL HIGH (ref 70–99)
Potassium: 5.4 mmol/L — ABNORMAL HIGH (ref 3.5–5.1)
Sodium: 139 mmol/L (ref 135–145)

## 2019-06-22 LAB — MAGNESIUM: Magnesium: 2.1 mg/dL (ref 1.7–2.4)

## 2019-06-22 LAB — PHOSPHORUS: Phosphorus: 2.6 mg/dL (ref 2.5–4.6)

## 2019-06-23 LAB — CBC W/ DIFFERENTIAL
BASOPHILS RELATIVE PERCENT: 1 %
EOSINOPHILS ABSOLUTE COUNT: 0.2 10*9/L
EOSINOPHILS RELATIVE PERCENT: 8 %
HEMATOCRIT: 31.8 % — ABNORMAL LOW
HEMOGLOBIN: 10.1 g/dL — ABNORMAL LOW
LYMPHOCYTES ABSOLUTE COUNT: 0.1 10*9/L — ABNORMAL LOW
MEAN CORPUSCULAR HEMOGLOBIN CONC: 31.8 g/dL
MEAN CORPUSCULAR HEMOGLOBIN: 25.9 pg — ABNORMAL LOW
MEAN CORPUSCULAR VOLUME: 81.5 fL
MONOCYTES ABSOLUTE COUNT: 0.4 10*9/L
MONOCYTES RELATIVE PERCENT: 16 %
NEUTROPHILS ABSOLUTE COUNT: 1.4 10*9/L — ABNORMAL LOW
PLATELET COUNT: 162 10*9/L
RED BLOOD CELL COUNT: 3.9 10*12/L — ABNORMAL LOW
RED CELL DISTRIBUTION WIDTH: 16.3 % — ABNORMAL HIGH
WHITE BLOOD CELL COUNT: 2.3 10*9/L — ABNORMAL LOW

## 2019-06-23 LAB — CO2: Lab: 23

## 2019-06-23 LAB — BASIC METABOLIC PANEL
BLOOD UREA NITROGEN: 42 mg/dL — ABNORMAL HIGH
CALCIUM: 10.5 mg/dL — ABNORMAL HIGH
CHLORIDE: 107 mmol/L
CO2: 23 mmol/L
CREATININE: 2.89 mg/dL — ABNORMAL HIGH
POTASSIUM: 5.4 mmol/L — ABNORMAL HIGH
SODIUM: 139 mmol/L

## 2019-06-23 LAB — MAGNESIUM: Lab: 2.1

## 2019-06-23 LAB — BASOPHILS RELATIVE PERCENT: Lab: 1

## 2019-06-23 LAB — PHOSPHORUS: Lab: 2.6

## 2019-06-23 LAB — TACROLIMUS LEVEL: Tacrolimus (FK506) - LabCorp: 5.6 ng/mL (ref 2.0–20.0)

## 2019-06-23 NOTE — Unmapped (Signed)
In reviewing recent ANC with Pharmacist Mincemoyer, will continue to monitor levels. No interventions at this time.     Drue Flirt, RN  Transplant Nurse Coordinator 06/23/2019 2:39 PM

## 2019-06-24 DIAGNOSIS — D899 Disorder involving the immune mechanism, unspecified: Principal | ICD-10-CM

## 2019-06-24 DIAGNOSIS — Z940 Kidney transplant status: Principal | ICD-10-CM

## 2019-06-24 LAB — TACROLIMUS, TROUGH: Lab: 5.6

## 2019-06-24 MED ORDER — TACROLIMUS ER 4 MG TABLET,EXTENDED RELEASE 24 HR
ORAL_TABLET | Freq: Every day | ORAL | 3 refills | 30.00000 days
Start: 2019-06-24 — End: ?

## 2019-06-24 MED ORDER — TACROLIMUS ER 1 MG TABLET,EXTENDED RELEASE 24 HR
Freq: Every day | ORAL | 0 days
Start: 2019-06-24 — End: ?

## 2019-06-24 NOTE — Unmapped (Signed)
After reviewing recent labs with Pharmacist Mincemoyer, from 1-06. We will continue to monitor is WBC and ANC.     This covering coordinator called patient to have him   Increase Envarsus by 1 mg   Pt. Has been taking 17 mg as of 12-07   Per primary coordinator note     Pt. To increase to 18 mg of Envarsus, 1 mg increase of what he is currently taking, spoke to pt. I would communicate change via My Chart   Pt. Verbalized understading.

## 2019-06-27 ENCOUNTER — Other Ambulatory Visit (HOSPITAL_COMMUNITY)
Admission: RE | Admit: 2019-06-27 | Discharge: 2019-06-27 | Disposition: A | Discharge status: Home / Self Care | Payer: Medicare Other | Source: Ambulatory Visit | Attending: Nephrology | Admitting: Nephrology

## 2019-06-27 DIAGNOSIS — E875 Hyperkalemia: Principal | ICD-10-CM

## 2019-06-27 DIAGNOSIS — Z940 Kidney transplant status: Principal | ICD-10-CM

## 2019-06-27 DIAGNOSIS — N390 Urinary tract infection, site not specified: Secondary | ICD-10-CM | POA: Diagnosis not present

## 2019-06-27 DIAGNOSIS — N189 Chronic kidney disease, unspecified: Secondary | ICD-10-CM | POA: Diagnosis not present

## 2019-06-27 DIAGNOSIS — D631 Anemia in chronic kidney disease: Secondary | ICD-10-CM | POA: Diagnosis not present

## 2019-06-27 DIAGNOSIS — D899 Disorder involving the immune mechanism, unspecified: Secondary | ICD-10-CM | POA: Insufficient documentation

## 2019-06-27 DIAGNOSIS — E559 Vitamin D deficiency, unspecified: Secondary | ICD-10-CM | POA: Insufficient documentation

## 2019-06-27 DIAGNOSIS — Z79899 Other long term (current) drug therapy: Secondary | ICD-10-CM | POA: Diagnosis not present

## 2019-06-27 DIAGNOSIS — B259 Cytomegaloviral disease, unspecified: Secondary | ICD-10-CM | POA: Insufficient documentation

## 2019-06-27 DIAGNOSIS — Z789 Other specified health status: Secondary | ICD-10-CM | POA: Diagnosis not present

## 2019-06-27 DIAGNOSIS — Z9483 Pancreas transplant status: Secondary | ICD-10-CM | POA: Insufficient documentation

## 2019-06-27 DIAGNOSIS — Z114 Encounter for screening for human immunodeficiency virus [HIV]: Secondary | ICD-10-CM | POA: Insufficient documentation

## 2019-06-27 DIAGNOSIS — T8610 Unspecified complication of kidney transplant: Secondary | ICD-10-CM | POA: Diagnosis not present

## 2019-06-27 LAB — MAGNESIUM
Lab: 1.8
Magnesium: 1.8 mg/dL (ref 1.7–2.4)

## 2019-06-27 LAB — CBC W/ DIFFERENTIAL
BASOPHILS ABSOLUTE COUNT: 0.1 10*9/L
BASOPHILS RELATIVE PERCENT: 2 %
EOSINOPHILS ABSOLUTE COUNT: 0.2 10*9/L
EOSINOPHILS RELATIVE PERCENT: 5 %
HEMATOCRIT: 31.7 % — ABNORMAL LOW
HEMOGLOBIN: 9.7 g/dL — ABNORMAL LOW
LYMPHOCYTES RELATIVE PERCENT: 6 %
MEAN CORPUSCULAR HEMOGLOBIN CONC: 30.6 g/dL
MEAN CORPUSCULAR HEMOGLOBIN: 25.5 pg — ABNORMAL LOW
MEAN CORPUSCULAR VOLUME: 83.4 fL
MONOCYTES ABSOLUTE COUNT: 0.2 10*9/L
MONOCYTES RELATIVE PERCENT: 6 %
NEUTROPHILS ABSOLUTE COUNT: 2.2 10*9/L
NEUTROPHILS RELATIVE PERCENT: 74 %
PLATELET COUNT: 180 10*9/L
RED BLOOD CELL COUNT: 3.8 10*12/L — ABNORMAL LOW
RED CELL DISTRIBUTION WIDTH: 16.3 % — ABNORMAL HIGH

## 2019-06-27 LAB — BASIC METABOLIC PANEL
Anion gap: 5 (ref 5–15)
BUN: 44 mg/dL — ABNORMAL HIGH (ref 6–20)
CHLORIDE: 106 mmol/L
CO2: 26 mmol/L (ref 22–32)
CO2: 26 mmol/L
CREATININE: 3.77 mg/dL — ABNORMAL HIGH
Calcium: 10.6 mg/dL — ABNORMAL HIGH (ref 8.9–10.3)
Chloride: 106 mmol/L (ref 98–111)
Creatinine, Ser: 3.77 mg/dL — ABNORMAL HIGH (ref 0.61–1.24)
EGFR CKD-EPI AA MALE: 23 mL/min/{1.73_m2} — ABNORMAL LOW
GFR calc Af Amer: 23 mL/min — ABNORMAL LOW (ref >60)
GFR calc non Af Amer: 20 mL/min — ABNORMAL LOW (ref >60)
GLUCOSE RANDOM: 97 mg/dL
Glucose, Bld: 97 mg/dL (ref 70–99)
POTASSIUM: 6 mmol/L — ABNORMAL HIGH
Potassium: 6 mmol/L — ABNORMAL HIGH (ref 3.5–5.1)
SODIUM: 137 mmol/L
Sodium: 137 mmol/L (ref 135–145)

## 2019-06-27 LAB — EGFR CKD-EPI AA FEMALE: Lab: 0

## 2019-06-27 LAB — MONOCYTES RELATIVE PERCENT: Lab: 6

## 2019-06-27 LAB — PHOSPHORUS
Lab: 2.4 — ABNORMAL LOW
Phosphorus: 2.4 mg/dL — ABNORMAL LOW (ref 2.5–4.6)

## 2019-06-27 LAB — CBC WITH DIFFERENTIAL/PLATELET
Abs Immature Granulocytes: 0.2 10*3/uL — ABNORMAL HIGH (ref 0.00–0.07)
Basophils Absolute: 0.1 10*3/uL (ref 0.0–0.1)
Basophils Relative: 2 %
Eosinophils Absolute: 0.2 10*3/uL (ref 0.0–0.5)
Eosinophils Relative: 5 %
HCT: 31.7 % — ABNORMAL LOW (ref 39.0–52.0)
Hemoglobin: 9.7 g/dL — ABNORMAL LOW (ref 13.0–17.0)
Immature Granulocytes: 7 %
Lymphocytes Relative: 6 %
Lymphs Abs: 0.2 10*3/uL — ABNORMAL LOW (ref 0.7–4.0)
MCH: 25.5 pg — ABNORMAL LOW (ref 26.0–34.0)
MCHC: 30.6 g/dL (ref 30.0–36.0)
MCV: 83.4 fL (ref 80.0–100.0)
Monocytes Absolute: 0.2 10*3/uL (ref 0.1–1.0)
Monocytes Relative: 6 %
Neutro Abs: 2.2 10*3/uL (ref 1.7–7.7)
Neutrophils Relative %: 74 %
Platelets: 180 10*3/uL (ref 150–400)
RBC: 3.8 MIL/uL — ABNORMAL LOW (ref 4.22–5.81)
RDW: 16.3 % — ABNORMAL HIGH (ref 11.5–15.5)
WBC: 3 10*3/uL — ABNORMAL LOW (ref 4.0–10.5)
nRBC: 0 % (ref 0.0–0.2)

## 2019-06-27 MED ORDER — LOKELMA 5 GRAM ORAL POWDER PACKET
Freq: Every day | ORAL | 5 refills | 15.00000 days | Status: CP
Start: 2019-06-27 — End: ?
  Filled 2019-07-12: qty 60, 30d supply, fill #0

## 2019-06-27 NOTE — Unmapped (Signed)
Called patient to check in regarding his K of 6.0     Patient reports taking 5 mg of Lokelma once daily     After reviewing lab results with Pharmacist Mincemoyer   Patient should  take 10 mg TID for 2 days of Lokelma then increase to 10 g every day    Informed patient of the changes, he verbalizes understanding.   He historically has hyperkalemia and has taken this medication acutely with ordered dose change.     Drue Flirt, RN Transplant Nurse Coordinator 06/27/2019 3:57 PM

## 2019-06-28 LAB — TACROLIMUS LEVEL: Tacrolimus (FK506) - LabCorp: 6.9 ng/mL (ref 2.0–20.0)

## 2019-06-30 ENCOUNTER — Other Ambulatory Visit (HOSPITAL_COMMUNITY)
Admission: AD | Admit: 2019-06-30 | Discharge: 2019-06-30 | Disposition: A | Discharge status: Home / Self Care | Payer: Medicare Other | Source: Ambulatory Visit | Attending: Nephrology | Admitting: Nephrology

## 2019-06-30 ENCOUNTER — Ambulatory Visit: Admit: 2019-06-30 | Discharge: 2019-07-01 | Payer: MEDICARE | Attending: Nephrology | Primary: Nephrology

## 2019-06-30 DIAGNOSIS — I10 Essential (primary) hypertension: Principal | ICD-10-CM

## 2019-06-30 DIAGNOSIS — Z940 Kidney transplant status: Principal | ICD-10-CM

## 2019-06-30 DIAGNOSIS — R8279 Other abnormal findings on microbiological examination of urine: Principal | ICD-10-CM

## 2019-06-30 DIAGNOSIS — D899 Disorder involving the immune mechanism, unspecified: Principal | ICD-10-CM

## 2019-06-30 DIAGNOSIS — D649 Anemia, unspecified: Principal | ICD-10-CM

## 2019-06-30 DIAGNOSIS — Z9483 Pancreas transplant status: Secondary | ICD-10-CM | POA: Insufficient documentation

## 2019-06-30 DIAGNOSIS — Z79899 Other long term (current) drug therapy: Secondary | ICD-10-CM | POA: Diagnosis not present

## 2019-06-30 DIAGNOSIS — N186 End stage renal disease: Secondary | ICD-10-CM | POA: Diagnosis not present

## 2019-06-30 DIAGNOSIS — B259 Cytomegaloviral disease, unspecified: Secondary | ICD-10-CM | POA: Insufficient documentation

## 2019-06-30 DIAGNOSIS — E1129 Type 2 diabetes mellitus with other diabetic kidney complication: Secondary | ICD-10-CM | POA: Diagnosis not present

## 2019-06-30 DIAGNOSIS — E1122 Type 2 diabetes mellitus with diabetic chronic kidney disease: Secondary | ICD-10-CM | POA: Diagnosis not present

## 2019-06-30 DIAGNOSIS — D631 Anemia in chronic kidney disease: Secondary | ICD-10-CM | POA: Diagnosis not present

## 2019-06-30 DIAGNOSIS — D849 Immunodeficiency, unspecified: Secondary | ICD-10-CM | POA: Diagnosis not present

## 2019-06-30 DIAGNOSIS — E559 Vitamin D deficiency, unspecified: Secondary | ICD-10-CM | POA: Diagnosis not present

## 2019-06-30 DIAGNOSIS — Z114 Encounter for screening for human immunodeficiency virus [HIV]: Secondary | ICD-10-CM | POA: Insufficient documentation

## 2019-06-30 DIAGNOSIS — N390 Urinary tract infection, site not specified: Secondary | ICD-10-CM | POA: Insufficient documentation

## 2019-06-30 DIAGNOSIS — Z789 Other specified health status: Secondary | ICD-10-CM | POA: Insufficient documentation

## 2019-06-30 DIAGNOSIS — K219 Gastro-esophageal reflux disease without esophagitis: Secondary | ICD-10-CM | POA: Diagnosis not present

## 2019-06-30 DIAGNOSIS — N189 Chronic kidney disease, unspecified: Secondary | ICD-10-CM | POA: Insufficient documentation

## 2019-06-30 DIAGNOSIS — I120 Hypertensive chronic kidney disease with stage 5 chronic kidney disease or end stage renal disease: Secondary | ICD-10-CM | POA: Diagnosis not present

## 2019-06-30 DIAGNOSIS — Q8781 Alport syndrome: Secondary | ICD-10-CM | POA: Diagnosis not present

## 2019-06-30 DIAGNOSIS — Z7982 Long term (current) use of aspirin: Secondary | ICD-10-CM | POA: Diagnosis not present

## 2019-06-30 DIAGNOSIS — Z992 Dependence on renal dialysis: Secondary | ICD-10-CM | POA: Diagnosis not present

## 2019-06-30 LAB — URINALYSIS
BACTERIA: NONE SEEN /HPF
BILIRUBIN UA: NEGATIVE
BLOOD UA: NEGATIVE
GLUCOSE UA: NEGATIVE
KETONES UA: NEGATIVE
LEUKOCYTE ESTERASE UA: NEGATIVE
NITRITE UA: NEGATIVE
PH UA: 7 (ref 5.0–9.0)
PROTEIN UA: NEGATIVE
RBC UA: 1 /HPF (ref <=3)
SPECIFIC GRAVITY UA: 1.01 (ref 1.003–1.030)
SQUAMOUS EPITHELIAL: <1 /HPF (ref 0–5)
UROBILINOGEN UA: 0.2
WBC UA: <1 /HPF (ref <=2)

## 2019-06-30 LAB — CBC W/ DIFFERENTIAL
BASOPHILS ABSOLUTE COUNT: 0 10*9/L
EOSINOPHILS ABSOLUTE COUNT: 0.2 10*9/L
EOSINOPHILS RELATIVE PERCENT: 7 %
HEMATOCRIT: 30.7 % — ABNORMAL LOW
HEMOGLOBIN: 9.5 g/dL — ABNORMAL LOW
LYMPHOCYTES ABSOLUTE COUNT: 0.2 10*9/L — ABNORMAL LOW
LYMPHOCYTES RELATIVE PERCENT: 7 %
MEAN CORPUSCULAR HEMOGLOBIN CONC: 30.9 g/dL
MEAN CORPUSCULAR HEMOGLOBIN: 25.5 pg — ABNORMAL LOW
MEAN CORPUSCULAR VOLUME: 82.5 fL
MONOCYTES ABSOLUTE COUNT: 0.1 10*9/L
MONOCYTES RELATIVE PERCENT: 4 %
NEUTROPHILS ABSOLUTE COUNT: 1.9 10*9/L
PLATELET COUNT: 211 10*9/L
RED BLOOD CELL COUNT: 3.72 10*12/L — ABNORMAL LOW
RED CELL DISTRIBUTION WIDTH: 16.2 % — ABNORMAL HIGH
WHITE BLOOD CELL COUNT: 2.3 10*9/L — ABNORMAL LOW

## 2019-06-30 LAB — BASIC METABOLIC PANEL
Anion gap: 9 (ref 5–15)
BUN: 38 mg/dL — ABNORMAL HIGH (ref 6–20)
CALCIUM: 10.2 mg/dL
CHLORIDE: 104 mmol/L
CO2: 24 mmol/L (ref 22–32)
CREATININE: 2.56 mg/dL — ABNORMAL HIGH
Calcium: 10.2 mg/dL (ref 8.9–10.3)
Chloride: 104 mmol/L (ref 98–111)
Creatinine, Ser: 2.56 mg/dL — ABNORMAL HIGH (ref 0.61–1.24)
EGFR CKD-EPI AA MALE: 37 mL/min/{1.73_m2} — ABNORMAL LOW
GFR calc Af Amer: 37 mL/min — ABNORMAL LOW (ref >60)
GFR calc non Af Amer: 32 mL/min — ABNORMAL LOW (ref >60)
GLUCOSE RANDOM: 140 mg/dL — ABNORMAL HIGH
Glucose, Bld: 140 mg/dL — ABNORMAL HIGH (ref 70–99)
POTASSIUM: 4.7 mmol/L
Potassium: 4.7 mmol/L (ref 3.5–5.1)
SODIUM: 137 mmol/L
Sodium: 137 mmol/L (ref 135–145)

## 2019-06-30 LAB — PROTEIN / CREATININE RATIO, URINE
CREATININE, URINE: 63.4 mg/dL
PROTEIN URINE: 4 mg/dL

## 2019-06-30 LAB — TACROLIMUS, TROUGH: Lab: 6.9

## 2019-06-30 LAB — MAGNESIUM
Lab: 1.7
Magnesium: 1.7 mg/dL (ref 1.7–2.4)

## 2019-06-30 LAB — CLARITY

## 2019-06-30 LAB — CO2: Lab: 24

## 2019-06-30 LAB — PHOSPHORUS
Lab: 3
Phosphorus: 3 mg/dL (ref 2.5–4.6)

## 2019-06-30 LAB — CREATININE, URINE: Lab: 63.4

## 2019-06-30 LAB — MICROCYTES: Lab: 0

## 2019-06-30 LAB — CBC WITH DIFFERENTIAL/PLATELET
Abs Immature Granulocytes: 0 10*3/uL (ref 0.00–0.07)
Basophils Absolute: 0 10*3/uL (ref 0.0–0.1)
Basophils Relative: 0 %
Eosinophils Absolute: 0.2 10*3/uL (ref 0.0–0.5)
Eosinophils Relative: 7 %
HCT: 30.7 % — ABNORMAL LOW (ref 39.0–52.0)
Hemoglobin: 9.5 g/dL — ABNORMAL LOW (ref 13.0–17.0)
Lymphocytes Relative: 7 %
Lymphs Abs: 0.2 10*3/uL — ABNORMAL LOW (ref 0.7–4.0)
MCH: 25.5 pg — ABNORMAL LOW (ref 26.0–34.0)
MCHC: 30.9 g/dL (ref 30.0–36.0)
MCV: 82.5 fL (ref 80.0–100.0)
Monocytes Absolute: 0.1 10*3/uL (ref 0.1–1.0)
Monocytes Relative: 4 %
Neutro Abs: 1.9 10*3/uL (ref 1.7–7.7)
Neutrophils Relative %: 82 %
Platelets: 211 10*3/uL (ref 150–400)
RBC: 3.72 MIL/uL — ABNORMAL LOW (ref 4.22–5.81)
RDW: 16.2 % — ABNORMAL HIGH (ref 11.5–15.5)
WBC: 2.3 10*3/uL — ABNORMAL LOW (ref 4.0–10.5)
nRBC: 0 % (ref 0.0–0.2)
nRBC: 0 /100 WBC

## 2019-06-30 NOTE — Unmapped (Signed)
Transplant Nephrology Clinic Visit    History of Present Illness    [name] is a 34 y.o. 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 He subsequently underwent another transplant 02/20/19. The renal function was not improving steadily as expected and he under went biopsy end of sep 2020. This did not show any signs of infection. He is following with [place] for post transplant care.     Subjective/Interval:     Patient doing much better, no clinical complaints other than some occasional pain on the foot and around hip on the left side.  Patient not demonstrating any symptoms of drug toxicity.  As of now patient with no acute issues, no new complaints, no fever chills or sweats. no chest pain palpitations orthopnea or shortness of breath. no lightheaded. no lower extremity edema. no abdominal pain no n/v/d. no myalgias or arthralgias. no dysuria hematuria or difficulty voiding.Other ros mostly negative, no hospital admission, no ED visits, no new physicians and no new medicines.     Appears to be compliant with medications, but lab works/ or results of tacro lvls have been very inconsistent with fluctuations all over the place. Requested him to get labs from [facility] facility at least for the next two weeks consistently  We had long discussion about the need for kidney biopsy and possible risk involved.     Assessment:  34 year old male status post deceased donor kidney transplant 02/20/2019 for ESRD secondary to Alport's Disease who presents for routine follow up and post-transplant care.       Recommendations/Plan:     Allograft Function, s/p DDKT: DGF    Patient renal function still not improving as expected. At best his Cr has base lined around the 2.5 mark with occasional worsening to the 4 region. His last biopsy was sep 2020 which just showed tubular injury. He does not have significant proteinuria DSA negative so far.       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 Discussed in length about the need for biopsy and pros and cons of this. Patient is agreeable for the same. Arranging biopsy for next week.     Immunosuppression Management [High Risk Medical Decision Making For Drug Therapy Requiring Intensive Monitoring For Toxicity]:     Been having difficulty with establishing a steady lvl. Patient on extremely high envarsus dosing of needing 18mg  daily. Asked him to get  labs from [facility] facility for the time being     Tacro target: 7-9  Tacro dose: 18mg  envarsus   Tacro last lvl /date: 6.9 / 06/27/19  Last date adjusted: 06/24/19  MMF/MPA dose: 360 bid   Wbc/anc count: 2.3/1.9  Side effects: diarrhea with mpa so lower dosed       Anemia due to chronic kidney disease:   Hb still holding around 9 region. Last iron panel was adequate and not needed epo in the recent months.     BMM and Electrolytes.   Patient with history of pthectomy / calcium/ phos lvl holding stable   Last pth nov 2020 288   K with tendency to run higher and needing lokelma support.     Blood Pressure Management: BP running 140+ at home as per patient. Will add chlorthalidone 25 to help with htn and kaliuresis     Infectious Prophylaxis  CMV D+/R+  EBV D+/R+  Continues with valcyte 450 daily /  As of now continue with atovaquone for pjp,  check G6PD lvl and continue to avoid bactrim since high K      Health Maintenance:    See Dermatology yearly    Immunizations  Flu Shot: done on 03/12/19 will need a booster?          Transplant History:  ??  Organ Received: Left DDT, DCD, KDPI: 36%; 16 hrs cold ischemia time  Native Kidney Disease: Alport's Syndrome  CPRA: 87  Date of Transplant: 02/20/19  Post-Transplant Course: DGF, last hemodialysis on 03/12/19--discontinued after increase in urine output with stable creatinine.  Prior Transplants: Yes  Induction: [place]  Date of Ureteral Stent Removal: 03/28/2019  Current Immunosuppression: Envarsus/Myfrotic  CMV/EBV Status: CMV D+/R+  EBV D+/R+  Valcyte 450 mg twice a week  Rejection Episodes: None  Donor Specific Antibodies: None  Results of Renal Imaging (pre and post):   Pre-Transplant 03/22/18  Small echogenic kidneys bilaterally, consistent with medical renal disease. 2 subcentimeter left renal cysts measuring up to 0.6 x 0.6 x 0.7 cm. No solid masses or calculi. No hydronephrosis  ??  Post-Transplant 02/20/19 (day of txp)  -Resistive indices in the left lower quadrant renal transplant arteries within normal limits.??  -Mildly increase in velocities noted within the main renal artery at the level of the anastomosis, likely postsurgical. Attention on follow-up.??  -Small perinephric fluid collection noted along the inferior pole transplant kidney measuring 2.3 x 3.2 x 0.8 cm as well as small amount of free fluid seen in the left upper quadrant, likely postoperative.     03/14/19 Kidney biopsy showed diffuse subacute tubular injury, no evidence for rejection (C4d negative)    Current Immunosuppression Regimen:   Envarsus 20 mg daily  Myfotic 360 mg BID      Past Medical History  1. Alport's Syndrome  2. Anemia of CKD  3. 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 Failed 11/24/2013  4. HTN  5. GERD    Review of Systems    Otherwise as per HPI, all other systems reviewed and are negative.    Medications  Current Outpatient Medications   Medication Sig Dispense Refill   ??? acetaminophen (TYLENOL) 500 MG tablet Take 1-2 tablets (500-1,000 mg total) by mouth every six (6) hours as needed for pain or fever (> 38C). 100 tablet 11   ??? amLODIPine (NORVASC) 5 MG tablet Take 2 tablets (10 mg total) by mouth daily. 60 tablet 11   ??? aspirin (ECOTRIN) 81 MG tablet  HOLD until directed to start in clinic. 30 tablet 11   ??? atovaquone (MEPRON) 750 mg/5 mL suspension Take 10 mL (1,500 mg total) by mouth daily. 300 mL 2   ??? carvediloL (COREG) 6.25 MG tablet TAKE 1 TABLET (6.25 MG TOTAL) BY MOUTH TWO (2) TIMES A DAY. 180 tablet 3   ??? chlorthalidone (HYGROTON) 25 MG tablet Take 1 tablet (25 mg total) by mouth every morning. 30 tablet 11   ??? famotidine (PEPCID) 20 MG tablet Take 1 tablet (20 mg total) by mouth daily. 30 tablet 5   ??? hydrOXYzine (ATARAX) 25 MG tablet Take 1 tablet (25 mg total) by mouth every eight (8) hours as needed for itching. 30 tablet 11   ??? loperamide (IMODIUM) 2 mg capsule Take 4 mg (2 tablets) at first onset of diarrhea then 2 mg (1 tablet) with additional diarrhea episodes as needed. Maximum 4 tablet per day. 60 capsule 11   ??? magnesium oxide-Mg AA chelate (MAGNESIUM, AMINO ACID CHELATE,) 133 mg Tab Take 1 tablet by mouth daily. 30 tablet 11   ??? mycophenolate (MYFORTIC) 180 MG EC tablet Take 2 tablets (360 mg total) by mouth Two (2) times a day. 120 tablet 11   ??? sodium bicarbonate 650 mg tablet Take 2 tablets (1,300 mg total) by mouth Two (2) times a day. 120 tablet 11   ??? sodium zirconium cyclosilicate (LOKELMA) 5 gram PwPk packet Take 2 packets (10 g total) by mouth daily. 60 each 2   ??? tacrolimus (ENVARSUS XR) 1 mg Tb24 extended release tablet Take 2 tablets (2 mg total) by mouth daily. Take 2 tablets (2 mg) with the 4 tablets (4 mg) for a total of 18 mg daily     ??? tacrolimus (ENVARSUS XR) 4 mg Tb24 extended release tablet Take 4 tablets (16 mg total) by mouth daily. Take 4 tablets (16 mg) by mouth daily with 2 (1mg ) tablets for a total of 18 mg daily 120 tablet 3   ??? valGANciclovir (VALCYTE) 450 mg tablet Take 1 tablet (450 mg total) by mouth daily. 15 tablet 0   ??? zolpidem (AMBIEN) 10 mg tablet Take 10 mg by mouth nightly as needed for sleep.       No current facility-administered medications for this visit.            Physical Exam  Vitals:    06/30/19 1439   BP: 129/80   Pulse: 83   Temp: 36.8 ??C (98.2 ??F)      Gen: built for age , without no distress,   Eyes: no pallor no icterus   ENT: wearing mask   Neck: wearing mask   CVS: s1 s2 heard, rhythm normal, no murmur   Resp: air entry b/l no creps   Abd: soft non tender   Ext: no edema, no muscle wasting   Cns:  Aaox3, no tremor,grossly intact   Skin:  No new rash, no lesions       Laboratory Data and Imaging reviewed in EPIC  Reviewed             Counseling:  I counseled the patient on:  The need to avoid sun exposure and the use of sunblock while outdoors given the relatively higher risk of skin malignancy in an immunosuppressed state.  The need for adherence to immunosuppression medication.  Patient verbalized understanding.     Follow-Up:  Return to clinic in 4 weeks  Patient will continue to follow-up with his primary care provider for  non-transplant related issues and medication refills. We have ordered transplant specific labs per the center's guidelines to monitor and assess for toxicities from immunosuppressant drug therapy

## 2019-06-30 NOTE — Unmapped (Signed)
AOBP   Patient's blood pressure was taken on right upper arm, small cuff.   1st reading 124/80, pulse: 85  2nd reading 133/79, pulse: 81  3rd reading 131/82, pulse: 82  Average reading 129/80, pulse: 83

## 2019-07-01 DIAGNOSIS — Z940 Kidney transplant status: Principal | ICD-10-CM

## 2019-07-01 DIAGNOSIS — D899 Disorder involving the immune mechanism, unspecified: Principal | ICD-10-CM

## 2019-07-01 LAB — TACROLIMUS LEVEL: Tacrolimus (FK506) - LabCorp: 9.3 ng/mL (ref 2.0–20.0)

## 2019-07-01 MED ORDER — TACROLIMUS ER 4 MG TABLET,EXTENDED RELEASE 24 HR
ORAL_TABLET | Freq: Every day | ORAL | 3 refills | 30 days | Status: CP
Start: 2019-07-01 — End: ?

## 2019-07-01 MED ORDER — TACROLIMUS ER 1 MG TABLET,EXTENDED RELEASE 24 HR
ORAL_TABLET | Freq: Every day | ORAL | 11 refills | 30 days | Status: CP
Start: 2019-07-01 — End: ?

## 2019-07-04 NOTE — Unmapped (Signed)
Spoke with Russell Wade on 1/15 after his visit with Dr. Lovena Neighbours on 1/14.  Pt reports no issues with medications.  Facilitated access for Envarsus and atovaquone and told him to stop Valcyte as he is now >3 months post tranpslant.

## 2019-07-04 NOTE — Unmapped (Signed)
Envarsus XR dose decrease (New 1 and 4 mg prescriptions)  Test claim $0.00 each - Medicare Part B

## 2019-07-05 LAB — TACROLIMUS, TROUGH: Lab: 9.3

## 2019-07-10 DIAGNOSIS — Z940 Kidney transplant status: Principal | ICD-10-CM

## 2019-07-10 MED ORDER — TACROLIMUS 1 MG CAPSULE
ORAL_CAPSULE | Freq: Two times a day (BID) | ORAL | 0 refills | 3 days | Status: CP
Start: 2019-07-10 — End: 2019-07-13
  Filled 2019-07-11: qty 60, 30d supply, fill #0

## 2019-07-10 NOTE — Unmapped (Signed)
Spoke to Dr. Willette Pa and patient can take tacrolimus 10mg  BID until his shipment of Envarsus arrives. Sent presciption to [facility]. Called patient and he stated he had tacrolimus left from when they switched him to Envarsus (most likely 200 capsules or more).   Pt understands to take tacrolimus 10 mg BID until Envarsus arrives. He will take 10 mg now then next dose tomorrow morning.     Routed message to primary TNC.

## 2019-07-10 NOTE — Unmapped (Signed)
Pt paged on call TNC to report he called [facility] [address] in [place] and they had 1 mg envarsus in stock. Other [facility] in his area did not have medication per patient report. When I called they were closed until 2pm. I was able to speak to [facility] when they reopened and they had only tacrolimus 1mg  not the Envarsus extended release in stock. Unfortunately, we are unable to get his medication.   Message sent to primary TNC so she is aware.

## 2019-07-10 NOTE — Unmapped (Signed)
Pt paged on call TNC to report he is out of Envarsus. He expected a shipment to arrive last week and it never did. He missed yesterday's dose and now today.  I attempted several pharmacies with no luck.   Left message with shared services   Attempted [facility] in [place], unavailable.    Attempted [facility] (only do discharges and employee scripts on weekend).  [facility] [place], unavailable.    Told patient that weekends can be very difficult to find immunosuppression and to please confirm delivery during the week.  He will try calling local [facility] pharmacies when they open and will let me know if Envarsus is in stock.     Routed message to primary TNC.

## 2019-07-11 MED FILL — MG-PLUS-PROTEIN 133 MG TABLET: 30 days supply | Qty: 30 | Fill #1 | Status: AC

## 2019-07-11 MED FILL — ATOVAQUONE 750 MG/5 ML ORAL SUSPENSION: ORAL | 30 days supply | Qty: 300 | Fill #2

## 2019-07-11 MED FILL — MYFORTIC 180 MG TABLET,DELAYED RELEASE: ORAL | 30 days supply | Qty: 120 | Fill #2

## 2019-07-11 MED FILL — FAMOTIDINE 20 MG TABLET: 30 days supply | Qty: 30 | Fill #2 | Status: AC

## 2019-07-11 MED FILL — ATOVAQUONE 750 MG/5 ML ORAL SUSPENSION: 30 days supply | Qty: 300 | Fill #2 | Status: AC

## 2019-07-11 MED FILL — FAMOTIDINE 20 MG TABLET: ORAL | 30 days supply | Qty: 30 | Fill #2

## 2019-07-11 MED FILL — ENVARSUS XR 4 MG TABLET,EXTENDED RELEASE: ORAL | 30 days supply | Qty: 120 | Fill #0

## 2019-07-11 MED FILL — ENVARSUS XR 1 MG TABLET,EXTENDED RELEASE: 30 days supply | Qty: 60 | Fill #0 | Status: AC

## 2019-07-11 MED FILL — AMLODIPINE 5 MG TABLET: 30 days supply | Qty: 60 | Fill #3 | Status: AC

## 2019-07-11 MED FILL — AMLODIPINE 5 MG TABLET: ORAL | 30 days supply | Qty: 60 | Fill #3

## 2019-07-11 MED FILL — ENVARSUS XR 4 MG TABLET,EXTENDED RELEASE: 30 days supply | Qty: 120 | Fill #0 | Status: AC

## 2019-07-11 MED FILL — SODIUM BICARBONATE 650 MG TABLET: ORAL | 30 days supply | Qty: 120 | Fill #3

## 2019-07-11 MED FILL — SODIUM BICARBONATE 650 MG TABLET: 30 days supply | Qty: 120 | Fill #3 | Status: AC

## 2019-07-11 MED FILL — MG-PLUS-PROTEIN 133 MG TABLET: ORAL | 30 days supply | Qty: 30 | Fill #1

## 2019-07-11 MED FILL — MYFORTIC 180 MG TABLET,DELAYED RELEASE: 30 days supply | Qty: 120 | Fill #2 | Status: AC

## 2019-07-11 NOTE — Unmapped (Signed)
[facility] Refill Coordination Note    Specialty Medication(s) to be Shipped:   Transplant: Envarsus 4mg , Envarsus 1mg , Myfortic 180mg  and Atovaquone    Other medication(s) to be shipped:   Amlodipine  Famotidine  Mag 133mg   Sodium Bicarb     Russell Wade, DOB: [birth year]  Phone: [phone] (home) [phone] (work)      All above HIPAA information was verified with patient.     Was a Nurse, learning disability used for this call? No    Completed refill call assessment today to schedule patient's medication shipment from the [facility] [phone]).       Specialty medication(s) and dose(s) confirmed: Regimen is correct and unchanged.   Changes to medications: Deren reports no changes at this time.  Changes to insurance: No  Questions for the pharmacist: No    Confirmed patient received Welcome Packet with first shipment. The patient will receive a drug information handout for each medication shipped and additional FDA Medication Guides as required.       DISEASE/MEDICATION-SPECIFIC INFORMATION        N/A    SPECIALTY MEDICATION ADHERENCE     Medication Adherence    Patient reported X missed doses in the last month: 0      Envarsus 4mg : 1 days worth of medication on hand.  Envarsus 1mg : 1 days worth of medication on hand.  Myfortic 180mg : 2 days worth of medication on hand.  Atovaquone: 4 days worth of medication on hand.                  SHIPPING     Shipping address confirmed in Epic.     Delivery Scheduled: Yes, Expected medication delivery date: 07/12/19.     Medication will be delivered via UPS to the prescription address in Epic WAM.    Russell Wade   [facility] Specialty Technician

## 2019-07-12 ENCOUNTER — Ambulatory Visit: Payer: Medicare Other

## 2019-07-12 ENCOUNTER — Encounter: Payer: Medicare Other | Admitting: Podiatry

## 2019-07-12 MED FILL — LOKELMA 5 GRAM ORAL POWDER PACKET: 30 days supply | Qty: 60 | Fill #0 | Status: AC

## 2019-07-13 ENCOUNTER — Other Ambulatory Visit: Payer: Self-pay | Admitting: Podiatry

## 2019-07-13 ENCOUNTER — Encounter: Payer: Self-pay | Admitting: Podiatry

## 2019-07-13 ENCOUNTER — Other Ambulatory Visit: Payer: Self-pay

## 2019-07-13 ENCOUNTER — Ambulatory Visit (INDEPENDENT_AMBULATORY_CARE_PROVIDER_SITE_OTHER): Payer: Medicare Other | Admitting: Podiatry

## 2019-07-13 ENCOUNTER — Ambulatory Visit (INDEPENDENT_AMBULATORY_CARE_PROVIDER_SITE_OTHER): Payer: Medicare Other

## 2019-07-13 VITALS — BP 127/86 | HR 75 | Temp 97.4°F

## 2019-07-13 DIAGNOSIS — M79671 Pain in right foot: Secondary | ICD-10-CM

## 2019-07-13 DIAGNOSIS — M79672 Pain in left foot: Secondary | ICD-10-CM

## 2019-07-13 DIAGNOSIS — M7670 Peroneal tendinitis, unspecified leg: Secondary | ICD-10-CM

## 2019-07-13 DIAGNOSIS — M76821 Posterior tibial tendinitis, right leg: Secondary | ICD-10-CM

## 2019-07-13 DIAGNOSIS — M76822 Posterior tibial tendinitis, left leg: Secondary | ICD-10-CM

## 2019-07-13 NOTE — Unmapped (Signed)
Called patient to follow up on scheduling his biopsy. He said he is still waiting to confirm a companion. Advised it is important to get this biopsy and if he can call me back today.

## 2019-07-13 NOTE — Patient Instructions (Signed)

## 2019-07-13 NOTE — Progress Notes (Signed)
Subjective:   Patient ID: [name], male   DOB: 34 y.o.   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  States that he is try to get more active again and he is having trouble because of the pain he is experiencing   Review of Systems  All other systems reviewed and are negative.       Objective:  Physical Exam Vitals and nursing note reviewed.  Constitutional:      Appearance: He is well-developed.  Pulmonary:     Effort: Pulmonary effort is normal.  Musculoskeletal:        General: Normal range of motion.  Skin:    General: Skin is warm.  Neurological:     Mental Status: He is alert.     Neurovascular status intact muscle strength was found to be mildly diminished but adequate range of motion subtalar midtarsal joint within normal limits.  Patient referred mild flatfoot deformity is noted to have discomfort of the posterior tibial tendon insertion into the navicular bilateral and mild discomfort of the fifth metatarsal bilateral with pain upon deep palpation.  Patient is found to have good digital perfusion well oriented x3 and appears to be doing well with transplant with no pathology noted F2     Assessment:  Posterior tibial tendinitis bilateral and mild discomfort with peroneal tendinitis bilateral     Plan:  H&P reviewed both conditions number to focus on the posterior tib and I did careful injections at the insertion navicular bilateral after explaining risk 3 mg Dexasone 5 mg Xylocaine after sterile preps.  Applied sterile dressings applied fascial brace is bilateral and reappoint to recheck again in the next several weeks or earlier if any issues were to occur  X-rays indicate that there is moderate depression of the arch no indications of other pathology

## 2019-07-20 ENCOUNTER — Ambulatory Visit: Admit: 2019-07-20 | Discharge: 2019-07-21 | Payer: MEDICARE

## 2019-07-20 DIAGNOSIS — Z940 Kidney transplant status: Secondary | ICD-10-CM | POA: Diagnosis not present

## 2019-07-20 DIAGNOSIS — R8279 Other abnormal findings on microbiological examination of urine: Secondary | ICD-10-CM | POA: Diagnosis not present

## 2019-07-20 DIAGNOSIS — E559 Vitamin D deficiency, unspecified: Secondary | ICD-10-CM | POA: Diagnosis not present

## 2019-07-20 LAB — URINALYSIS
BILIRUBIN UA: NEGATIVE
BLOOD UA: NEGATIVE
GLUCOSE UA: NEGATIVE
KETONES UA: NEGATIVE
LEUKOCYTE ESTERASE UA: NEGATIVE
NITRITE UA: NEGATIVE
PH UA: 7 (ref 5.0–9.0)
PROTEIN UA: NEGATIVE
SPECIFIC GRAVITY UA: 1.015 (ref 1.005–1.040)
SQUAMOUS EPITHELIAL: <1 /HPF (ref 0–5)
WBC UA: 1 /HPF (ref <2)

## 2019-07-20 LAB — CBC W/ AUTO DIFF
BASOPHILS ABSOLUTE COUNT: 0 10*9/L (ref 0.0–0.1)
EOSINOPHILS ABSOLUTE COUNT: 0.2 10*9/L (ref 0.0–0.7)
EOSINOPHILS RELATIVE PERCENT: 4.1 %
HEMATOCRIT: 32 % — ABNORMAL LOW (ref 38.0–50.0)
HEMOGLOBIN: 10.5 g/dL — ABNORMAL LOW (ref 13.5–17.5)
LYMPHOCYTES ABSOLUTE COUNT: 0.3 10*9/L — ABNORMAL LOW (ref 0.7–4.0)
MEAN CORPUSCULAR HEMOGLOBIN CONC: 32.9 g/dL (ref 30.0–36.0)
MEAN CORPUSCULAR HEMOGLOBIN: 26.3 pg (ref 26.0–34.0)
MEAN PLATELET VOLUME: 9 fL (ref 7.0–10.0)
MONOCYTES ABSOLUTE COUNT: 0.7 10*9/L (ref 0.1–1.0)
MONOCYTES RELATIVE PERCENT: 12.5 %
NEUTROPHILS ABSOLUTE COUNT: 4 10*9/L (ref 1.7–7.7)
NEUTROPHILS RELATIVE PERCENT: 76.3 %
PLATELET COUNT: 219 10*9/L (ref 150–450)
RED BLOOD CELL COUNT: 4 10*12/L — ABNORMAL LOW (ref 4.32–5.72)
WBC ADJUSTED: 5.3 10*9/L (ref 3.5–10.5)

## 2019-07-20 LAB — COMPREHENSIVE METABOLIC PANEL
ALBUMIN: 5.1 g/dL — ABNORMAL HIGH (ref 3.5–5.0)
ALKALINE PHOSPHATASE: 186 U/L — ABNORMAL HIGH (ref 38–126)
ALT (SGPT): 17 U/L (ref <50)
ANION GAP: 8 mmol/L (ref 7–15)
AST (SGOT): 21 U/L (ref 19–55)
BILIRUBIN TOTAL: 0.4 mg/dL (ref 0.0–1.2)
BUN / CREAT RATIO: 16
CALCIUM: 11 mg/dL — ABNORMAL HIGH (ref 8.5–10.2)
CHLORIDE: 105 mmol/L (ref 98–107)
CO2: 25 mmol/L (ref 22.0–30.0)
CREATININE: 2.51 mg/dL — ABNORMAL HIGH (ref 0.70–1.30)
EGFR CKD-EPI AA MALE: 37 mL/min/{1.73_m2} — ABNORMAL LOW (ref >=60)
EGFR CKD-EPI NON-AA MALE: 32 mL/min/{1.73_m2} — ABNORMAL LOW (ref >=60)
GLUCOSE RANDOM: 115 mg/dL (ref 70–179)
POTASSIUM: 4.7 mmol/L (ref 3.5–5.0)
PROTEIN TOTAL: 8.2 g/dL (ref 6.5–8.3)
SODIUM: 138 mmol/L (ref 135–145)

## 2019-07-20 LAB — LIPID PANEL
CHOLESTEROL/HDL RATIO SCREEN: 4
CHOLESTEROL: 194 mg/dL (ref 100–199)
HDL CHOLESTEROL: 49 mg/dL (ref 40–59)
LDL CHOLESTEROL CALCULATED: 106 mg/dL
NON-HDL CHOLESTEROL: 145 mg/dL
TRIGLYCERIDES: 197 mg/dL — ABNORMAL HIGH (ref 1–149)
VLDL CHOLESTEROL CAL: 39.4 mg/dL

## 2019-07-20 LAB — PROTEIN / CREATININE RATIO, URINE: CREATININE, URINE: 65.9 mg/dL

## 2019-07-20 LAB — PARATHYROID HORMONE INTACT: Parathyrin.intact:MCnc:Pt:Ser/Plas:Qn:: 501.1 — ABNORMAL HIGH

## 2019-07-20 LAB — VITAMIN D, TOTAL (25OH): Lab: 14.7 — ABNORMAL LOW

## 2019-07-20 LAB — NITRITE UA: Nitrite:PrThr:Pt:Urine:Ord:Test strip: NEGATIVE

## 2019-07-20 LAB — MEAN CORPUSCULAR HEMOGLOBIN CONC: Erythrocyte mean corpuscular hemoglobin concentration:MCnc:Pt:RBC:Qn:Automated count: 32.9

## 2019-07-20 LAB — PROTEIN URINE: Protein:MCnc:Pt:Urine:Qn:: 8

## 2019-07-20 LAB — CO2: Carbon dioxide:SCnc:Pt:Ser/Plas:Qn:: 25

## 2019-07-20 LAB — LDL CHOLESTEROL CALCULATED: Cholesterol.in LDL:MCnc:Pt:Ser/Plas:Qn:Calculated: 106

## 2019-07-20 LAB — POIKILOCYTES

## 2019-07-20 LAB — MAGNESIUM: Magnesium:MCnc:Pt:Ser/Plas:Qn:: 1.7

## 2019-07-21 DIAGNOSIS — R7989 Other specified abnormal findings of blood chemistry: Principal | ICD-10-CM

## 2019-07-21 DIAGNOSIS — Z940 Kidney transplant status: Principal | ICD-10-CM

## 2019-07-21 DIAGNOSIS — T8619 Other complication of kidney transplant: Principal | ICD-10-CM

## 2019-07-21 LAB — TACROLIMUS BLOOD: Lab: 2.8

## 2019-07-21 NOTE — Unmapped (Addendum)
Pt. Confirms he didn't miss any doses, denies any n/v/d  He did take his medication after his 1125 lab draw.   He thinks he took his medication b/t 9-10 am on Tuesday     After reviewing recent labs with Trevor Iha, DO    And Nunzio Cobbs, MD    No changes at this time. Patient had reasonable levels at the same dose previously, will see next level before making any changes. Pt. Continues at 18 mg daily    Pt. Aware of the plan.     Drue Flirt, RN Transplant Nurse Coordinator 07/21/2019 3:08 PM

## 2019-07-22 LAB — BK BLOOD LOG(10): Lab: 0

## 2019-07-22 LAB — BK VIRUS QUANTITATIVE PCR, BLOOD: BK BLOOD RESULT: NOT DETECTED

## 2019-07-22 LAB — CMV COMMENT: Lab: 0

## 2019-07-22 LAB — CMV DNA, QUANTITATIVE, PCR

## 2019-07-22 NOTE — Unmapped (Addendum)
This covering coordinator called the patient to instruct him to hold his aspirin for upcoming biopsy scheduled for 2/12 Friday   He has not taking aspirin per patient report and his medication list has been instructed to hold until further notice.     Pt. Also reminded he needs to have a driver for his biopsy next week. Renforiced he can not drive him to and from biopsy.   Pt. verbalizes understanding.       Drue Flirt, RNTransplant Nurse Coordinator 07/22/2019 3:07 PM

## 2019-07-26 LAB — VITAMIN D 1,25-DIHYDROXY: 1,25-Dihydroxyvitamin D:MCnc:Pt:Ser/Plas:Qn:: 34

## 2019-07-27 LAB — HLA DS POST TRANSPLANT
ANTI-DONOR DRW #1 MFI: 30 MFI
ANTI-DONOR DRW #2 MFI: 30 MFI
ANTI-DONOR HLA-A #1 MFI: 17 MFI
ANTI-DONOR HLA-A #2 MFI: 12 MFI
ANTI-DONOR HLA-B #1 MFI: 31 MFI
ANTI-DONOR HLA-C #1 MFI: 0 MFI
ANTI-DONOR HLA-DQB #1 MFI: 531 MFI
ANTI-DONOR HLA-DR #1 MFI: 38 MFI
ANTI-DONOR HLA-DR #2 MFI: 35 MFI

## 2019-07-27 LAB — FSAB CLASS 1 ANTIBODY SPECIFICITY

## 2019-07-27 LAB — DONOR HLA-A ANTIGEN #1

## 2019-07-27 LAB — HLA CLASS 1 ANTIBODY

## 2019-07-27 LAB — HLA CL2 AB COMMENT: Lab: 0

## 2019-07-28 ENCOUNTER — Other Ambulatory Visit: Payer: Self-pay

## 2019-07-28 ENCOUNTER — Ambulatory Visit (HOSPITAL_COMMUNITY)
Admission: EM | Admit: 2019-07-28 | Discharge: 2019-07-28 | Discharge status: Against Medical Advice | Payer: Medicare Other

## 2019-07-28 NOTE — ED Notes (Signed)
Pt decided to leave because his PCP wanted him to have his covid test done at their offices.

## 2019-07-28 NOTE — Unmapped (Signed)
Pt. Called on call nurse @ 1530 to inform he has recently been around a person that has now tested pos for COVID. He has no s/s but is at local testing center now for rapid testing. Is scheduled for renal bx tomorrow and wanted team to be aware. This info relayed by phone to primary coord, W. Leonides Schanz, RN @ [phone].

## 2019-07-29 NOTE — Unmapped (Signed)
-----   Message from Baltazar Najjar, RN sent at 07/28/2019  6:31 PM EST -----  Regarding: COVID Test  Please schedule patient for COVID testing tomorrow. He had direct contact with a COVID + person and is Immunosuppressed recently transplanted.

## 2019-07-29 NOTE — Unmapped (Addendum)
Pt . Called on call nurse @ 1530 to inform he has recently been around a person that has now tested + for COVID. He  has no s/s but is at local testing center now for rapid testing. Notified him that scheduled renal biopsy tomorrow is rescheduled for 08/08/19 per Nunzio Cobbs, MD.

## 2019-07-29 NOTE — Unmapped (Signed)
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   If Yes: Student is symptomatic: N/A    Student is a [employer] [facility] [employer] employee/volunteer: N/A      Patient is experiencing symptoms severe enough that you would like to speak with a provider: No (Asymptomatic)       Patient is calling to schedule a COVID test: Yes -- Asked additional question below before attempting to schedule  If Yes: Patient has tested positive in the last 90 days for COVID-19: No -- Attempted to schedule      Advised If you think you might have COVID-19 or might have been exposed to COVID-19, you should practice physical distancing and other measures to reduce disease spread. We do recommend that you and everyone in your house stay at home as much as possible.  Keep six (6) feet of distance between you and other people. Wash your hands frequently and avoid touching your face.  The CDC now recommends wearing cloth face coverings or masks in public settings where other physical distancing measures are difficult to maintain, such as grocery stores and pharmacies. Call your PCP or the COVID Help Line [phone]) from 7a-7p with new or worsening symptoms.      The call was resolved in the following manner: COVID testing scheduled

## 2019-07-30 ENCOUNTER — Ambulatory Visit: Admit: 2019-07-30 | Discharge: 2019-07-30 | Payer: MEDICARE

## 2019-08-01 ENCOUNTER — Ambulatory Visit: Admit: 2019-08-01 | Discharge: 2019-08-02 | Payer: MEDICARE

## 2019-08-02 ENCOUNTER — Ambulatory Visit: Admit: 2019-08-02 | Discharge: 2019-08-03 | Payer: MEDICARE | Attending: Family | Primary: Family

## 2019-08-02 ENCOUNTER — Ambulatory Visit: Admit: 2019-08-02 | Discharge: 2019-08-03 | Payer: MEDICARE

## 2019-08-02 DIAGNOSIS — Z940 Kidney transplant status: Principal | ICD-10-CM

## 2019-08-02 DIAGNOSIS — R8279 Other abnormal findings on microbiological examination of urine: Secondary | ICD-10-CM | POA: Diagnosis not present

## 2019-08-02 DIAGNOSIS — E559 Vitamin D deficiency, unspecified: Secondary | ICD-10-CM | POA: Diagnosis not present

## 2019-08-02 DIAGNOSIS — Z01812 Encounter for preprocedural laboratory examination: Secondary | ICD-10-CM | POA: Diagnosis not present

## 2019-08-02 DIAGNOSIS — Z20822 Contact with and (suspected) exposure to covid-19: Secondary | ICD-10-CM | POA: Diagnosis not present

## 2019-08-02 DIAGNOSIS — Z79899 Other long term (current) drug therapy: Secondary | ICD-10-CM | POA: Diagnosis not present

## 2019-08-02 LAB — COMPREHENSIVE METABOLIC PANEL
ALBUMIN: 4.8 g/dL (ref 3.5–5.0)
ALKALINE PHOSPHATASE: 201 U/L — ABNORMAL HIGH (ref 38–126)
ALT (SGPT): 15 U/L (ref <50)
AST (SGOT): 21 U/L (ref 19–55)
BILIRUBIN TOTAL: 0.5 mg/dL (ref 0.0–1.2)
BLOOD UREA NITROGEN: 41 mg/dL — ABNORMAL HIGH (ref 7–21)
BUN / CREAT RATIO: 15
CALCIUM: 10.4 mg/dL — ABNORMAL HIGH (ref 8.5–10.2)
CHLORIDE: 102 mmol/L (ref 98–107)
CO2: 26 mmol/L (ref 22.0–30.0)
CREATININE: 2.73 mg/dL — ABNORMAL HIGH (ref 0.70–1.30)
EGFR CKD-EPI AA MALE: 34 mL/min/{1.73_m2} — ABNORMAL LOW (ref >=60)
EGFR CKD-EPI NON-AA MALE: 29 mL/min/{1.73_m2} — ABNORMAL LOW (ref >=60)
POTASSIUM: 5.1 mmol/L — ABNORMAL HIGH (ref 3.5–5.0)
PROTEIN TOTAL: 7.7 g/dL (ref 6.5–8.3)
SODIUM: 139 mmol/L (ref 135–145)

## 2019-08-02 LAB — URINALYSIS
BACTERIA: NONE SEEN /HPF
BILIRUBIN UA: NEGATIVE
BLOOD UA: NEGATIVE
GLUCOSE UA: NEGATIVE
KETONES UA: NEGATIVE
LEUKOCYTE ESTERASE UA: NEGATIVE
NITRITE UA: NEGATIVE
PH UA: 7 (ref 5.0–9.0)
PROTEIN UA: NEGATIVE
SPECIFIC GRAVITY UA: 1.01 (ref 1.005–1.040)
SQUAMOUS EPITHELIAL: 1 /HPF (ref 0–5)
UROBILINOGEN UA: 0.2
WBC UA: 3 /HPF — ABNORMAL HIGH (ref <2)

## 2019-08-02 LAB — CBC W/ AUTO DIFF
BASOPHILS ABSOLUTE COUNT: 0 10*9/L (ref 0.0–0.1)
BASOPHILS RELATIVE PERCENT: 1.3 %
EOSINOPHILS ABSOLUTE COUNT: 0.3 10*9/L (ref 0.0–0.7)
EOSINOPHILS RELATIVE PERCENT: 8.7 %
HEMATOCRIT: 33.1 % — ABNORMAL LOW (ref 38.0–50.0)
HEMOGLOBIN: 10.7 g/dL — ABNORMAL LOW (ref 13.5–17.5)
LYMPHOCYTES RELATIVE PERCENT: 9.2 %
MEAN CORPUSCULAR HEMOGLOBIN CONC: 32.5 g/dL (ref 30.0–36.0)
MEAN CORPUSCULAR HEMOGLOBIN: 26.2 pg (ref 26.0–34.0)
MEAN CORPUSCULAR VOLUME: 80.7 fL — ABNORMAL LOW (ref 81.0–95.0)
MEAN PLATELET VOLUME: 9.3 fL (ref 7.0–10.0)
MONOCYTES RELATIVE PERCENT: 13.6 %
NEUTROPHILS ABSOLUTE COUNT: 2.1 10*9/L (ref 1.7–7.7)
NEUTROPHILS RELATIVE PERCENT: 67.2 %
PLATELET COUNT: 177 10*9/L (ref 150–450)
RED BLOOD CELL COUNT: 4.09 10*12/L — ABNORMAL LOW (ref 4.32–5.72)
WBC ADJUSTED: 3.1 10*9/L — ABNORMAL LOW (ref 3.5–10.5)

## 2019-08-02 LAB — MAGNESIUM: Magnesium:MCnc:Pt:Ser/Plas:Qn:: 1.7

## 2019-08-02 LAB — PARATHYROID HORMONE INTACT: Parathyrin.intact:MCnc:Pt:Ser/Plas:Qn:: 500.6 — ABNORMAL HIGH

## 2019-08-02 LAB — MEAN PLATELET VOLUME: Platelet mean volume:EntVol:Pt:Bld:Qn:Automated count: 9.3

## 2019-08-02 LAB — ALBUMIN: Albumin:MCnc:Pt:Ser/Plas:Qn:: 4.8

## 2019-08-02 LAB — PROTEIN URINE: Protein:MCnc:Pt:Urine:Qn:: 7

## 2019-08-02 LAB — SMEAR REVIEW

## 2019-08-02 LAB — LIPID PANEL
NON-HDL CHOLESTEROL: 156 mg/dL
TRIGLYCERIDES: 244 mg/dL — ABNORMAL HIGH (ref 1–149)
VLDL CHOLESTEROL CAL: 48.8 mg/dL

## 2019-08-02 LAB — PROTEIN / CREATININE RATIO, URINE: CREATININE, URINE: 88 mg/dL

## 2019-08-02 LAB — NITRITE UA: Nitrite:PrThr:Pt:Urine:Ord:Test strip: NEGATIVE

## 2019-08-02 LAB — FASTING

## 2019-08-02 NOTE — Unmapped (Signed)
left message, called to schedule follow up from No Show appt w/Dr Lovena Neighbours

## 2019-08-02 NOTE — Unmapped (Signed)
Assessment     Russell Wade is a 34 y.o. male presenting to [facility] for COVID testing.     Problem List Items Addressed This Visit     None      Visit Diagnoses     Contact with and (suspected) exposure to covid-19              Plan     If no testing performed, pt counseled on routine care for respiratory illness.  If testing performed, COVID sent.  Patient directed to Home given findings during today's visit.    Subjective     Russell Wade is a 34 y.o. male who [facility] with complaints of the following:    Exposure History: In the last 21 days?     Have you traveled outside of [place]? No               Have you been in close contact with someone confirmed by a test to have COVID? (Close contact is within 6 feet for at least 10 minutes) No       Have you worked in a health care facility? No     Lived or worked facility like a nursing home, group home, or assisted living?    No         Are you scheduled to have surgery or a procedure in the next 3 days? No               Are you scheduled to receive cancer chemotherapy within the next 7 days?    No     Have you ever been tested before for COVID-19 with a swab of your nose? Yes: When: n/a, Where: n/a   Are you a healthcare worker being tested so to return to work No         Right now,  do you have any of the following that developed over the past 7 days (as stated by patient on intake form):    Subjective fever (felt feverish) No   Chills (especially repeated shaking chills) No   Severe fatigue (felt very tired) No   Muscle aches No   Runny nose No   Sore throat No   Loss of taste or smell No   Cough (new onset or worsening of chronic cough) No   Shortness of breath No   Nausea or vomiting No   Headache No   Abdominal Pain No   Diarrhea (3 or more loose stools in last 24 hours) No     History/Medical Conditions (as stated by patient on intake form):    Do you have any of the following:   Asthma or emphysema or COPD No   Cystic Fibrosis No   Diabetes No   High Blood Pressure  No   Cardiovascular Disease No   Chronic Kidney Disease Yes   Chronic Liver Disease No   Chronic blood disorder like Sickle Cell Disease  No   Weak immune system due to disease or medication Yes   Neurologic condition that limits movement  No   Developmental delay - Moderate to Severe  No   Recent (within past 2 weeks) or current Pregnancy No   Morbid Obesity (>100 pounds over ideal weight) No   Current Smoker Yes   Former Smoker No       Objective     Given above, testing performed: Yes    Testing Performed:  Test Specimen Type Sent to   COVID-19  NP Swab [facility]       Scribe's Attestation: Cortni Tays L. Lorin Picket, FNP obtained and performed the history, physical exam and medical decision making elements that were entered into the chart.  Signed by Edison Simon serving as Scribe, on 08/02/2019 1:39 PM      The documentation recorded by the scribe accurately reflects the service I personally performed and the decisions made by me. Aida Puffer, FNP  August 02, 2019 4:10 PM

## 2019-08-02 NOTE — Unmapped (Signed)
[facility] Refill Coordination Note    Specialty Medication(s) to be Shipped:   Transplant: Envarsus 1mg , Envarsus 4mg , Myfortic 180mg  and Atovaquone    Other medication(s) to be shipped:   Amlodipine   Famotidine   Mag 133mg    Sodium Bicarb       Russell Wade, DOB: 02-Apr-1986  Phone: [phone] (home) [phone] (work)      All above HIPAA information was verified with patient.     Was a Nurse, learning disability used for this call? No    Completed refill call assessment today to schedule patient's medication shipment from the [facility] [phone]).       Specialty medication(s) and dose(s) confirmed: Regimen is correct and unchanged.   Changes to medications: Russell Wade reports no changes at this time.  Changes to insurance: No  Questions for the pharmacist: No    Confirmed patient received Welcome Packet with first shipment. The patient will receive a drug information handout for each medication shipped and additional FDA Medication Guides as required.       DISEASE/MEDICATION-SPECIFIC INFORMATION        N/A    SPECIALTY MEDICATION ADHERENCE     Medication Adherence    Patient reported X missed doses in the last month: 0        Envarsus 4mg : 10 days worth of medication on hand.   Envarsus 1mg : 10 days worth of medication on hand.   Myfortic 180mg : 10 days worth of medication on hand.   Atovaquone: 8 days worth of medication on hand.            SHIPPING     Shipping address confirmed in Epic.     Delivery Scheduled: Yes, Expected medication delivery date: 08/09/19.     Medication will be delivered via UPS to the prescription address in Epic WAM.    [name]   [facility] Specialty Technician

## 2019-08-02 NOTE — Unmapped (Signed)
Pt request for RX Refill

## 2019-08-03 ENCOUNTER — Other Ambulatory Visit: Payer: Self-pay

## 2019-08-03 ENCOUNTER — Encounter: Payer: Self-pay | Admitting: Podiatry

## 2019-08-03 ENCOUNTER — Ambulatory Visit (INDEPENDENT_AMBULATORY_CARE_PROVIDER_SITE_OTHER): Payer: Medicare Other | Admitting: Podiatry

## 2019-08-03 DIAGNOSIS — M76822 Posterior tibial tendinitis, left leg: Secondary | ICD-10-CM

## 2019-08-03 DIAGNOSIS — M76821 Posterior tibial tendinitis, right leg: Secondary | ICD-10-CM

## 2019-08-03 DIAGNOSIS — M7670 Peroneal tendinitis, unspecified leg: Secondary | ICD-10-CM | POA: Diagnosis not present

## 2019-08-03 LAB — TACROLIMUS BLOOD: Lab: 5.1

## 2019-08-03 LAB — CMV COMMENT: Lab: 0

## 2019-08-03 LAB — VITAMIN D, TOTAL (25OH): Lab: 11 — ABNORMAL LOW

## 2019-08-03 LAB — CMV DNA, QUANTITATIVE, PCR

## 2019-08-03 NOTE — Progress Notes (Signed)
Subjective:   Patient ID: [name], male   DOB: 34 y.o.   MRN: [id number]   HPI Patient presents stating that he has had a significant reduction of discomfort but feels like he needs more support.  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  Patient will be seen back as symptoms indicate in the future and I spent a great deal of time educating him on this condition and its relationship to foot structure

## 2019-08-05 LAB — BK VIRUS QUANTITATIVE PCR, BLOOD

## 2019-08-05 LAB — BK BLOOD COMMENT: Lab: 0

## 2019-08-05 NOTE — Unmapped (Addendum)
Kidney Transplant Biopsy Note    Date of Biopsy Referral: 2.22.21    Referring Transplant Provider:Mark Kleman/ Nunzio Cobbs  Pager: [phone]   Phone:    Kidney Transplant Coordinator: Crystian Frith    Biopsy Fellow  _____________________ at _______________________ otherwise contact referring provider.     (NAME)   (CONTACT - pager/cell phone #)    ---------------------------------------------------------------------------------------------------------------------  Lamar Benes?: Yes  Patient Name: [name]  MR: [id number]  Age: 34 y.o.  Sex: Male Race: Black or African American Ethnicity: Not Hispanic or Latino   DOB:1985/09/30    Procedures: Ultrasound Guided Percutaneous Kidney Biopsy under Moderate Sedation  Tissue Submitted: Kidney  Special Studies Required: LM, IF, EM  ----------------------------------------------------------------------------------------------------------------------  Date of allograft implantation: 02/20/2019  ABO Incompatible: No  Underlying native kidney disease: Alport Syndrome  Was the diagnosis established by biopsy? Yes  Previous transplant biopsies: Yes  If yes, what were the previous diagnoses?   Final Diagnosis   Kidney allograft, needle biopsy:  ??  Diffuse subacute tubular injury  ??  No evidence for rejection (C4d negative, HLA-DR negative)       Previous kidney transplants: Yes If yes, this is #: 2    Donor Information (if available)  Age of donor:  Gender: unknown Race: unknown  Type of donor: Cadaveric  Ischemia time: 991 minutes  Delayed graft function: Yes  If yes, how many days on dialysis: unknown    History/Clinical Diagnosis/Indication for Biopsy:    Nunzio Cobbs, MD  Nephrology  Immunosuppression (CMS-HCC) +4 more  Dx  Kidney TXP Follow-up ; Referred by [facility]  Reason for Visit   Progress Notes    Expand AllCollapse All    Transplant Nephrology Clinic Visit  ??  History of Present Illness  ??  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 He subsequently underwent another transplant 02/20/19. The renal function was not improving steadily as expected and he under went biopsy end of sep 2020. This did not show any signs of infection. He is following with [place] for post transplant care.   ??  Subjective/Interval:   ??  Patient doing much better, no clinical complaints other than some occasional pain on the foot and around hip on the left side.  Patient not demonstrating any symptoms of drug toxicity.  As of now patient with no acute issues, no new complaints, no fever chills or sweats. no chest pain palpitations orthopnea or shortness of breath. no lightheaded. no lower extremity edema. no abdominal pain no n/v/d. no myalgias or arthralgias. no dysuria hematuria or difficulty voiding.Other ros mostly negative, no hospital admission, no ED visits, no new physicians and no new medicines.   ??  Appears to be compliant with medications, but lab works/ or results of tacro lvls have been very inconsistent with fluctuations all over the place. Requested him to get labs from [facility] facility at least for the next two weeks consistently  We had long discussion about the need for kidney biopsy and possible risk involved.   ??  Assessment:  34 year old male status post deceased donor kidney transplant 02/20/2019 for ESRD secondary to Alport's Disease who presents for routine follow up and post-transplant care.     ??  Recommendations/Plan:   ??  Allograft Function, s/p DDKT: DGF  Patient renal function still not improving as expected. At best his Cr has base lined around the 2.5 mark with occasional worsening to the 4 region. His last biopsy was sep 2020 which just showed tubular injury. He does not have significant proteinuria DSA negative so far. ??  Baseline Cr: ~2.5 at best as of now / with worsening to the 4   Last Cr: 2.5                 Date: 06/30/19  Decoy cell/ BK    neg  Date:   05/10/19  DSA                 neg      Date:   05/10/19  CMV:                           Neg      Date:   05/10/19  UPC:                0.063   Date:    05/10/19 / no rbc   ??  Decoy cell/ BK  neg  Date:  03/25/19 / DSA neg  Date: 03/02/19  /  CMV: nge   Date: 04/27/19  / UPC: 0.3  Date: 03/25/19    /   ??  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  Since Cr not improving and in fact there are spikes to the 4 region. Discussed in length about the need for biopsy and pros and cons of this. Patient is agreeable for the same. Arranging biopsy for next week.   ??  Immunosuppression Management [High Risk Medical Decision Making For Drug Therapy Requiring Intensive Monitoring For Toxicity]:   ??  Been having difficulty with establishing a steady lvl. Patient on extremely high envarsus dosing of needing 18mg  daily. Asked him to get labs from [facility] facility for the time being   ??  Tacro target: 7-9  Tacro dose: 18mg  envarsus   Tacro last lvl /date: 6.9 / 06/27/19  Last date adjusted: 06/24/19  MMF/MPA dose: 360 bid   Wbc/anc count: 2.3/1.9  Side effects: diarrhea with mpa so lower dosed   ??  ??  Anemia due to chronic kidney disease:   Hb still holding around 9 region. Last iron panel was adequate and not needed epo in the recent months.   ??  BMM and Electrolytes.   Patient with history of pthectomy / calcium/ phos lvl holding stable   Last pth nov 2020 288   K with tendency to run higher and needing lokelma support.   ??  Blood Pressure Management: BP running 140+ at home as per patient. Will add chlorthalidone 25 to help with htn and kaliuresis   ??  Infectious Prophylaxis ??CMV D+/R+ ??EBV D+/R+  Continues with valcyte 450 daily /  As of now continue with atovaquone for pjp,  check G6PD lvl and continue to avoid bactrim since high K    ??  Health Maintenance:    See Dermatology yearly  ??  Immunizations  Flu Shot: done on 03/12/19 will need a booster?    ??  ??  ??  Transplant History:  ??  Organ Received:??Left DDT, DCD, KDPI: 36%; 16 hrs cold ischemia time  Native Kidney  Disease:??Alport's Syndrome  CPRA: 87  Date of Transplant:??02/20/19  Post-Transplant Course:??DGF, last hemodialysis on 03/12/19--discontinued after increase in urine output with stable creatinine.  Prior Transplants:??Yes  Induction:??[place]  Date of Ureteral Stent Removal:??03/28/2019  Current Immunosuppression:??Envarsus/Myfrotic  CMV/EBV Status:??CMV D+/R+ ??EBV D+/R+  Valcyte 450 mg twice a week  Rejection Episodes:??None  Donor Specific Antibodies:??None  Results of Renal Imaging (pre and post):??  Pre-Transplant 03/22/18  Small echogenic kidneys bilaterally, consistent with medical renal disease. 2 subcentimeter left renal cysts measuring up to 0.6 x 0.6 x 0.7 cm. No solid masses or calculi. No hydronephrosis  ??  Post-Transplant??02/20/19 (day of txp)  -Resistive indices in the left lower quadrant renal transplant arteries within normal limits.??  -Mildly increase in velocities noted within the main renal artery at the level of the anastomosis, likely postsurgical. Attention on follow-up.??  -Small perinephric fluid collection noted along the inferior pole transplant kidney measuring 2.3 x 3.2 x 0.8 cm as well as small amount of free fluid seen in the left upper quadrant, likely postoperative.  ??   03/14/19 Kidney biopsy showed diffuse subacute tubular injury, no evidence for rejection (C4d negative)  ??  Current Immunosuppression Regimen:   Envarsus 20 mg daily  Myfotic 360 mg BID  ??  ??  Past Medical History  1. Alport's Syndrome  2. Anemia of CKD  3. 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 Failed 11/24/2013  4. HTN  5. GERD  ??  Review of Systems  ??  Otherwise as per HPI, all other systems reviewed and are negative. ??  Medications  Current Medications          Current Outpatient Medications   Medication Sig Dispense Refill   ??? acetaminophen (TYLENOL) 500 MG tablet Take 1-2 tablets (500-1,000 mg total) by mouth every six (6) hours as needed for pain or fever (> 38C). 100 tablet 11   ??? amLODIPine (NORVASC) 5 MG tablet Take 2 tablets (10 mg total) by mouth daily. 60 tablet 11   ??? aspirin (ECOTRIN) 81 MG tablet HOLD until directed to start in clinic. 30 tablet 11   ??? atovaquone (MEPRON) 750 mg/5 mL suspension Take 10 mL (1,500 mg total) by mouth daily. 300 mL 2   ??? carvediloL (COREG) 6.25 MG tablet TAKE 1 TABLET (6.25 MG TOTAL) BY MOUTH TWO (2) TIMES A DAY. 180 tablet 3   ??? chlorthalidone (HYGROTON) 25 MG tablet Take 1 tablet (25 mg total) by mouth every morning. 30 tablet 11   ??? famotidine (PEPCID) 20 MG tablet Take 1 tablet (20 mg total) by mouth daily. 30 tablet 5   ??? hydrOXYzine (ATARAX) 25 MG tablet Take 1 tablet (25 mg total) by mouth every eight (8) hours as needed for itching. 30 tablet 11   ??? loperamide (IMODIUM) 2 mg capsule Take 4 mg (2 tablets) at first onset of diarrhea then 2 mg (1 tablet) with additional diarrhea episodes as needed. Maximum 4 tablet per day. 60 capsule 11   ??? magnesium oxide-Mg AA chelate (MAGNESIUM, AMINO ACID CHELATE,) 133 mg Tab Take 1 tablet by mouth daily. 30 tablet 11   ??? mycophenolate (MYFORTIC) 180 MG EC tablet Take 2 tablets (360 mg total) by mouth Two (2) times a day. 120 tablet 11   ??? sodium bicarbonate 650 mg tablet Take 2 tablets (1,300 mg total) by mouth Two (2) times a day. 120 tablet 11   ??? sodium zirconium cyclosilicate (  LOKELMA) 5 gram PwPk packet Take 2 packets (10 g total) by mouth daily. 60 each 2   ??? tacrolimus (ENVARSUS XR) 1 mg Tb24 extended release tablet Take 2 tablets (2 mg total) by mouth daily. Take 2 tablets (2 mg) with the 4 tablets (4 mg) for a total of 18 mg daily ?? ??   ??? tacrolimus (ENVARSUS XR) 4 mg Tb24 extended release tablet Take 4 tablets (16 mg total) by mouth daily. Take 4 tablets (16 mg) by mouth daily with 2 (1mg ) tablets for a total of 18 mg daily 120 tablet 3   ??? valGANciclovir (VALCYTE) 450 mg tablet Take 1 tablet (450 mg total) by mouth daily. 15 tablet 0   ??? zolpidem (AMBIEN) 10 mg tablet Take 10 mg by mouth nightly as needed for sleep. ?? ??   ??  No current facility-administered medications for this visit.       ??  ??  ??  ??  Physical Exam      Vitals:   ?? 06/30/19 1439   BP: 129/80   Pulse: 83   Temp: 36.8 ??C (98.2 ??F)      Gen: built for age , without no distress,   Eyes: no pallor no icterus   ENT: wearing mask   Neck: wearing mask   CVS: s1 s2 heard, rhythm normal, no murmur   Resp: air entry b/l no creps   Abd: soft non tender   Ext: no edema, no muscle wasting   Cns:  Aaox3, no tremor,grossly intact   Skin:  No new rash, no lesions   ??  ??  Laboratory Data and Imaging reviewed in EPIC  Reviewed   ??  ????  Counseling:  I counseled the patient on:  The need to avoid sun exposure and the use of sunblock while outdoors given the relatively higher risk of skin malignancy in an immunosuppressed state.  The need for adherence to immunosuppression medication.  Patient verbalized understanding.  ??  ??  ??  ??            ----------------------------------------------------------------------------------------------------------------------  Current Baseline Immunosuppression: tacrolimus (Prograf or Envarsus) and mycophenolate (Cellcept or Myfortic) (please select ALL drugs used)  Specific anti-rejection treatment WITHIN 2 WEEKS before biopsy: No  If yes, what was the type of treatment? None  Patient off immunosuppression?: No  Patient seems adherent to immunosuppression? Yes  Patient is currently back on dialysis? No  Has patient had any prior episodes of rejection? No   If yes, what was the treatment? None  ----------------------------------------------------------------------------------------------------------------------  Donor Specific Antibody Results:  Lab Results Component Value Date    Donor ID [id number] 07/20/2019    Donor HLA-A Antigen #1 A2 07/20/2019    Anti-Donor HLA-A #1 MFI 17 07/20/2019    Donor HLA-A Antigen #2 A68 07/20/2019    Donor HLA-B Antigen #1 B27 07/20/2019    Anti-Donor HLA-B #1 MFI 31 07/20/2019    Donor HLA-B Antigen #2 B45 07/20/2019    Anti-Donor HLA-B #2 MFI 0 07/20/2019    Donor HLA-C Antigen #1 C2 07/20/2019    Anti-Donor HLA-C #1 MFI 0 07/20/2019    Donor HLA-C Antigen #2 C16 07/20/2019    Anti-Donor HLA-C #2 MFI 2 07/20/2019    Donor HLA-DR Antigen #1 DR13 07/20/2019    Anti-Donor HLA-DR #1 MFI 38 07/20/2019    Donor HLA-DR Antigen #2 DR14 07/20/2019    Anti-Donor HLA-DR #2 MFI 35 07/20/2019    Donor DRw Antigen #1 DR52 07/20/2019  Anti-Donor DRw #1 MFI 30 07/20/2019    Donor HLA-DQB Antigen #1 DQ5 07/20/2019    Anti-Donor HLA-DQB #1 MFI 531 07/20/2019    Donor HLA-DQB Antigen #2 DQ5 05/10/2019    Anti-Donor HLA-DQB #2 MFI 138 05/10/2019    DSA Comment  07/20/2019      Comment:      The HLA antigens listed are donor antigens and the corresponding MFI value.  MFI values >= 1000 are considered positive.  A negative result does not exclude the presence of low level DSA.  Blank donor antigen and MFI fields indicate unavailable donor HLA   type or lack of appropriate single antigen bead to determine DSA.  As such, the presence or absence of DSA to the locus cannot be confirmed.     Donor specific antibody (DSA) testing is performed with a  solid phase multiplex single antigen bead array method.  All procedures and reagents have been validated and performance characteristics determined by the [facility].    Certain of these tests have not been cleared/approved by the U.S. Food and Drug Administration (FDA).  The FDA has determined that such approval/clearance is not necessary because this laboratory is certified under the Clinical Laboratory Improvements   Amendments to perform high complexity testing.  This test is used for clinical purposes.  It should not be regarded as investigational or for research.  All HLA typings are performed using molecular methodologies.  Details of test procedures may be   obtained by calling [phone].  HLA-A, B, C, DR, and DQ results are serological equivalents of the molecular type.         Blood Pressure (mmHg):   BP Readings from Last 3 Encounters:   06/30/19 129/80   05/10/19 136/94   05/10/19 136/94     On Anti-hypertensive Therapy: Yes    Urinalysis:  Lab Results   Component Value Date    Color, UA Yellow 08/02/2019    Color, UA RED 03/16/2014    Specific Gravity, UA 1.010 08/02/2019    Specific Gravity, UA 1.015 03/16/2014    pH, UA 7.0 08/02/2019    pH, UA 9.0 03/16/2014    Glucose, UA Negative 08/02/2019    Glucose, UA NEG 03/16/2014    Ketones, UA Negative 08/02/2019    Ketones, UA NEG 03/16/2014    Blood, UA Negative 08/02/2019    Blood, UA +4 03/16/2014    Nitrite, UA Negative 08/02/2019    Nitrite, UA NEG 03/16/2014    Leukocyte Esterase, UA Negative 08/02/2019    Leukocyte Esterase, UA : 03/16/2014    Urobilinogen, UA 0.2 mg/dL 64/40/3474    Urobilinogen, UA 1 03/16/2014    Bilirubin, UA Negative 08/02/2019    Bilirubin, UA NEG 03/16/2014    WBC, UA >100 (H) 03/16/2014    RBC, UA >100 (H) 03/16/2014     Urine protein/creatinine ratio:   Lab Results   Component Value Date    Protein/Creatinine Ratio, Urine 0.080 08/02/2019    Protein/Creatinine Ratio, Urine 10.681 11/24/2013      Creatinine (present peak): 2.7 Creatinine (baseline): 2.5  Lab Results   Component Value Date    CREATININE 2.73 (H) 08/02/2019    CREATININE 2.51 (H) 07/20/2019    CREATININE 2.56 (H) 06/30/2019    CREATININE 3.77 (H) 06/27/2019    CREATININE 2.89 (H) 06/22/2019       Glucose:   Lab Results   Component Value Date    Glucose 115 08/02/2019  HGBA1C:   Lab Results   Component Value Date    Hemoglobin A1C 4.9 02/20/2019    Hemoglobin A1C 4.9 02/11/2007 ----------------------------------------------------------------------------------------------------------------------  Clinical signs of infections at time of current biopsy:  BK: No   BK blood viral load:   Lab Results   Component Value Date    BK Blood Result Not Detected 07/20/2019    BK Blood Result Negative 09/22/2012        Urine decoy cells present: No    CMV: No  CMV viral load:   Lab Results   Component Value Date    CMV Viral Ld Not Detected 08/02/2019    CMV Viral Ld Not Detected 02/12/2011    CMV Quant 1468 08/22/2008    CMV Comment : 02/12/2011      EBV:No   EBV viral load: No results found for: EBVIU, EBVLOG    HSV: No  Hepatitis B: No  Hepatitis C: No  Bacteria: No  Fungi: No  Urinary tract infection: No  Other:   ----------------------------------------------------------------------------------------------------------------------  Stenosis of renal artery: No  Obstruction of ureter: No  Lymphocele: No

## 2019-08-07 LAB — DSA COMMENT

## 2019-08-08 DIAGNOSIS — Z940 Kidney transplant status: Principal | ICD-10-CM

## 2019-08-08 LAB — VITAMIN D 1,25-DIHYDROXY: 1,25-Dihydroxyvitamin D:MCnc:Pt:Ser/Plas:Qn:: 34

## 2019-08-08 MED FILL — FAMOTIDINE 20 MG TABLET: 30 days supply | Qty: 30 | Fill #3 | Status: AC

## 2019-08-08 MED FILL — LOKELMA 5 GRAM ORAL POWDER PACKET: ORAL | 30 days supply | Qty: 60 | Fill #1

## 2019-08-08 MED FILL — ACETAMINOPHEN 500 MG TABLET: ORAL | 13 days supply | Qty: 100 | Fill #3

## 2019-08-08 MED FILL — AMLODIPINE 5 MG TABLET: ORAL | 30 days supply | Qty: 60 | Fill #4

## 2019-08-08 MED FILL — SODIUM BICARBONATE 650 MG TABLET: ORAL | 30 days supply | Qty: 120 | Fill #4

## 2019-08-08 MED FILL — MG-PLUS-PROTEIN 133 MG TABLET: 30 days supply | Qty: 30 | Fill #2 | Status: AC

## 2019-08-08 MED FILL — MG-PLUS-PROTEIN 133 MG TABLET: ORAL | 30 days supply | Qty: 30 | Fill #2

## 2019-08-08 MED FILL — LOKELMA 5 GRAM ORAL POWDER PACKET: 30 days supply | Qty: 60 | Fill #1 | Status: AC

## 2019-08-08 MED FILL — MYFORTIC 180 MG TABLET,DELAYED RELEASE: ORAL | 30 days supply | Qty: 120 | Fill #3

## 2019-08-08 MED FILL — ACETAMINOPHEN 500 MG TABLET: 13 days supply | Qty: 100 | Fill #3 | Status: AC

## 2019-08-08 MED FILL — FAMOTIDINE 20 MG TABLET: ORAL | 30 days supply | Qty: 30 | Fill #3

## 2019-08-08 MED FILL — ENVARSUS XR 1 MG TABLET,EXTENDED RELEASE: 30 days supply | Qty: 60 | Fill #1 | Status: AC

## 2019-08-08 MED FILL — ENVARSUS XR 1 MG TABLET,EXTENDED RELEASE: ORAL | 30 days supply | Qty: 60 | Fill #1

## 2019-08-08 MED FILL — MYFORTIC 180 MG TABLET,DELAYED RELEASE: 30 days supply | Qty: 120 | Fill #3 | Status: AC

## 2019-08-08 MED FILL — ENVARSUS XR 4 MG TABLET,EXTENDED RELEASE: 30 days supply | Qty: 120 | Fill #1 | Status: AC

## 2019-08-08 MED FILL — ENVARSUS XR 4 MG TABLET,EXTENDED RELEASE: ORAL | 30 days supply | Qty: 120 | Fill #1

## 2019-08-08 MED FILL — SODIUM BICARBONATE 650 MG TABLET: 30 days supply | Qty: 120 | Fill #4 | Status: AC

## 2019-08-08 MED FILL — AMLODIPINE 5 MG TABLET: 30 days supply | Qty: 60 | Fill #4 | Status: AC

## 2019-08-08 NOTE — Unmapped (Signed)
Refill request for atovaquone was denied because patient has completed therapy.   Let patient know he we no longer need refills on atovaquone.

## 2019-08-09 ENCOUNTER — Ambulatory Visit: Admit: 2019-08-09 | Discharge: 2019-08-09 | Payer: MEDICARE

## 2019-08-15 NOTE — Unmapped (Signed)
Pt advised to give me a  date when he can come in for a kidney  biopsy. He missed the last one. [facility] labs tomorrow, no labs in two weeks,

## 2019-08-16 ENCOUNTER — Ambulatory Visit: Admit: 2019-08-16 | Discharge: 2019-08-17 | Payer: MEDICARE

## 2019-08-16 DIAGNOSIS — Z79899 Other long term (current) drug therapy: Secondary | ICD-10-CM | POA: Diagnosis not present

## 2019-08-16 DIAGNOSIS — Z940 Kidney transplant status: Secondary | ICD-10-CM | POA: Diagnosis not present

## 2019-08-16 DIAGNOSIS — R8279 Other abnormal findings on microbiological examination of urine: Secondary | ICD-10-CM | POA: Diagnosis not present

## 2019-08-16 LAB — COMPREHENSIVE METABOLIC PANEL
ALBUMIN: 4.8 g/dL (ref 3.5–5.0)
ALKALINE PHOSPHATASE: 199 U/L — ABNORMAL HIGH (ref 38–126)
ALT (SGPT): 19 U/L (ref <50)
ANION GAP: 11 mmol/L (ref 7–15)
AST (SGOT): 23 U/L (ref 19–55)
BILIRUBIN TOTAL: 0.5 mg/dL (ref 0.0–1.2)
BLOOD UREA NITROGEN: 38 mg/dL — ABNORMAL HIGH (ref 7–21)
BUN / CREAT RATIO: 13
CALCIUM: 10.8 mg/dL — ABNORMAL HIGH (ref 8.5–10.2)
CHLORIDE: 107 mmol/L (ref 98–107)
CO2: 25 mmol/L (ref 22.0–30.0)
CREATININE: 2.99 mg/dL — ABNORMAL HIGH (ref 0.70–1.30)
EGFR CKD-EPI AA MALE: 30 mL/min/{1.73_m2} — ABNORMAL LOW (ref >=60)
EGFR CKD-EPI NON-AA MALE: 26 mL/min/{1.73_m2} — ABNORMAL LOW (ref >=60)
GLUCOSE RANDOM: 116 mg/dL (ref 70–179)
PROTEIN TOTAL: 7.8 g/dL (ref 6.5–8.3)

## 2019-08-16 LAB — CBC W/ AUTO DIFF
EOSINOPHILS ABSOLUTE COUNT: 0.3 10*9/L (ref 0.0–0.7)
EOSINOPHILS RELATIVE PERCENT: 9.7 %
HEMATOCRIT: 33.4 % — ABNORMAL LOW (ref 38.0–50.0)
LYMPHOCYTES ABSOLUTE COUNT: 0.3 10*9/L — ABNORMAL LOW (ref 0.7–4.0)
LYMPHOCYTES RELATIVE PERCENT: 10.3 %
MEAN CORPUSCULAR HEMOGLOBIN CONC: 32.7 g/dL (ref 30.0–36.0)
MEAN CORPUSCULAR HEMOGLOBIN: 27 pg (ref 26.0–34.0)
MEAN CORPUSCULAR VOLUME: 82.4 fL (ref 81.0–95.0)
MEAN PLATELET VOLUME: 8.7 fL (ref 7.0–10.0)
MONOCYTES ABSOLUTE COUNT: 0.6 10*9/L (ref 0.1–1.0)
MONOCYTES RELATIVE PERCENT: 20 %
NEUTROPHILS ABSOLUTE COUNT: 1.8 10*9/L (ref 1.7–7.7)
NEUTROPHILS RELATIVE PERCENT: 58.9 %
PLATELET COUNT: 175 10*9/L (ref 150–450)
RED CELL DISTRIBUTION WIDTH: 17.9 % — ABNORMAL HIGH (ref 12.0–15.0)
WBC ADJUSTED: 3 10*9/L — ABNORMAL LOW (ref 3.5–10.5)

## 2019-08-16 LAB — URINALYSIS
BACTERIA: NONE SEEN /HPF
BILIRUBIN UA: NEGATIVE
BLOOD UA: NEGATIVE
GLUCOSE UA: NEGATIVE
KETONES UA: NEGATIVE
NITRITE UA: NEGATIVE
PH UA: 6.5 (ref 5.0–9.0)
PROTEIN UA: NEGATIVE
RBC UA: <3 /HPF (ref <3)
SPECIFIC GRAVITY UA: 1.01 (ref 1.005–1.040)
SQUAMOUS EPITHELIAL: 0 /HPF (ref 0–5)
UROBILINOGEN UA: 0.2
WBC UA: <2 /HPF (ref <2)

## 2019-08-16 LAB — PROTEIN URINE: Protein:MCnc:Pt:Urine:Qn:: 9.8

## 2019-08-16 LAB — PROTEIN UA: Protein:MCnc:Pt:Urine:Qn:Test strip: NEGATIVE

## 2019-08-16 LAB — LYMPHOCYTES RELATIVE PERCENT: Lymphocytes/100 leukocytes:NFr:Pt:Bld:Qn:Automated count: 10.3

## 2019-08-16 LAB — MAGNESIUM: Magnesium:MCnc:Pt:Ser/Plas:Qn:: 2

## 2019-08-16 LAB — SMEAR REVIEW

## 2019-08-16 LAB — CREATININE: Creatinine:MCnc:Pt:Ser/Plas:Qn:: 2.99 — ABNORMAL HIGH

## 2019-08-16 NOTE — Progress Notes (Signed)
This encounter was created in error - please disregard.

## 2019-08-17 LAB — TACROLIMUS BLOOD: Lab: 7.6

## 2019-08-22 DIAGNOSIS — N3000 Acute cystitis without hematuria: Principal | ICD-10-CM

## 2019-08-22 DIAGNOSIS — Z940 Kidney transplant status: Principal | ICD-10-CM

## 2019-08-25 ENCOUNTER — Ambulatory Visit: Admit: 2019-08-25 | Discharge: 2019-08-25 | Payer: MEDICARE

## 2019-08-25 ENCOUNTER — Ambulatory Visit: Admit: 2019-08-25 | Discharge: 2019-08-25 | Payer: MEDICARE | Attending: Nephrology | Primary: Nephrology

## 2019-08-25 DIAGNOSIS — E875 Hyperkalemia: Principal | ICD-10-CM

## 2019-08-25 DIAGNOSIS — Z940 Kidney transplant status: Principal | ICD-10-CM

## 2019-08-25 DIAGNOSIS — Z4822 Encounter for aftercare following kidney transplant: Secondary | ICD-10-CM | POA: Diagnosis not present

## 2019-08-25 DIAGNOSIS — T8691 Unspecified transplanted organ and tissue rejection: Secondary | ICD-10-CM | POA: Diagnosis not present

## 2019-08-25 DIAGNOSIS — Z7982 Long term (current) use of aspirin: Secondary | ICD-10-CM | POA: Diagnosis not present

## 2019-08-25 DIAGNOSIS — Z87718 Personal history of other specified (corrected) congenital malformations of genitourinary system: Secondary | ICD-10-CM | POA: Diagnosis not present

## 2019-08-25 DIAGNOSIS — Z87448 Personal history of other diseases of urinary system: Secondary | ICD-10-CM | POA: Diagnosis not present

## 2019-08-25 DIAGNOSIS — D849 Immunodeficiency, unspecified: Secondary | ICD-10-CM | POA: Diagnosis not present

## 2019-08-25 DIAGNOSIS — E892 Postprocedural hypoparathyroidism: Secondary | ICD-10-CM | POA: Diagnosis not present

## 2019-08-25 DIAGNOSIS — Z79899 Other long term (current) drug therapy: Secondary | ICD-10-CM | POA: Diagnosis not present

## 2019-08-25 DIAGNOSIS — I10 Essential (primary) hypertension: Secondary | ICD-10-CM | POA: Diagnosis not present

## 2019-08-25 LAB — LIPID PANEL
CHOLESTEROL/HDL RATIO SCREEN: 5.7 — ABNORMAL HIGH (ref <5.0)
CHOLESTEROL: 189 mg/dL (ref 100–199)
HDL CHOLESTEROL: 33 mg/dL — ABNORMAL LOW (ref 40–59)
NON-HDL CHOLESTEROL: 156 mg/dL
TRIGLYCERIDES: 220 mg/dL — ABNORMAL HIGH (ref 1–149)
VLDL CHOLESTEROL CAL: 44 mg/dL (ref 10–50)

## 2019-08-25 LAB — URINALYSIS
BACTERIA: NONE SEEN /HPF
BILIRUBIN UA: NEGATIVE
BLOOD UA: NEGATIVE
GLUCOSE UA: NEGATIVE
KETONES UA: NEGATIVE
PH UA: 6 (ref 5.0–9.0)
PROTEIN UA: NEGATIVE
RBC UA: 1 /HPF (ref <=3)
SPECIFIC GRAVITY UA: 1.01 (ref 1.003–1.030)
SQUAMOUS EPITHELIAL: <1 /HPF (ref 0–5)
UROBILINOGEN UA: 0.2
WBC UA: 1 /HPF (ref <=2)

## 2019-08-25 LAB — COMPREHENSIVE METABOLIC PANEL
ALBUMIN: 4.3 g/dL (ref 3.5–5.0)
ALKALINE PHOSPHATASE: 188 U/L — ABNORMAL HIGH (ref 38–126)
ALT (SGPT): 14 U/L (ref <50)
ANION GAP: 7 mmol/L (ref 7–15)
BILIRUBIN TOTAL: 0.4 mg/dL (ref 0.0–1.2)
BLOOD UREA NITROGEN: 30 mg/dL — ABNORMAL HIGH (ref 7–21)
BUN / CREAT RATIO: 12
CALCIUM: 10.2 mg/dL (ref 8.5–10.2)
CHLORIDE: 107 mmol/L (ref 98–107)
CO2: 26 mmol/L (ref 22.0–30.0)
EGFR CKD-EPI AA MALE: 38 mL/min/{1.73_m2} — ABNORMAL LOW (ref >=60)
EGFR CKD-EPI NON-AA MALE: 33 mL/min/{1.73_m2} — ABNORMAL LOW (ref >=60)
GLUCOSE RANDOM: 103 mg/dL (ref 70–179)
POTASSIUM: 4.8 mmol/L (ref 3.5–5.0)
PROTEIN TOTAL: 7 g/dL (ref 6.5–8.3)
SODIUM: 140 mmol/L (ref 135–145)

## 2019-08-25 LAB — CBC W/ AUTO DIFF
BASOPHILS ABSOLUTE COUNT: 0 10*9/L (ref 0.0–0.1)
EOSINOPHILS ABSOLUTE COUNT: 0.2 10*9/L (ref 0.0–0.4)
EOSINOPHILS RELATIVE PERCENT: 7 %
HEMATOCRIT: 34 % — ABNORMAL LOW (ref 41.0–53.0)
LARGE UNSTAINED CELLS: 4 % (ref 0–4)
LYMPHOCYTES ABSOLUTE COUNT: 0.3 10*9/L — ABNORMAL LOW (ref 1.5–5.0)
LYMPHOCYTES RELATIVE PERCENT: 11 %
MEAN CORPUSCULAR HEMOGLOBIN: 26.3 pg (ref 26.0–34.0)
MEAN CORPUSCULAR VOLUME: 84.7 fL (ref 80.0–100.0)
MEAN PLATELET VOLUME: 8.8 fL (ref 7.0–10.0)
MONOCYTES ABSOLUTE COUNT: 0.3 10*9/L (ref 0.2–0.8)
MONOCYTES RELATIVE PERCENT: 10.7 %
NEUTROPHILS ABSOLUTE COUNT: 1.8 10*9/L — ABNORMAL LOW (ref 2.0–7.5)
NEUTROPHILS RELATIVE PERCENT: 66.6 %
PLATELET COUNT: 191 10*9/L (ref 150–440)
RED BLOOD CELL COUNT: 4.01 10*12/L — ABNORMAL LOW (ref 4.50–5.90)
RED CELL DISTRIBUTION WIDTH: 18 % — ABNORMAL HIGH (ref 12.0–15.0)
WBC ADJUSTED: 2.8 10*9/L — ABNORMAL LOW (ref 4.5–11.0)

## 2019-08-25 LAB — RBC UA: Erythrocytes:Naric:Pt:Urine sed:Qn:Microscopy.light.HPF: 1

## 2019-08-25 LAB — CREATININE: Creatinine:MCnc:Pt:Ser/Plas:Qn:: 2.5 — ABNORMAL HIGH

## 2019-08-25 LAB — PROTEIN / CREATININE RATIO, URINE: PROTEIN URINE: 6.8 mg/dL

## 2019-08-25 LAB — CREATININE, URINE: Lab: 81.4

## 2019-08-25 LAB — MAGNESIUM: Magnesium:MCnc:Pt:Ser/Plas:Qn:: 1.7

## 2019-08-25 LAB — PARATHYROID HORMONE INTACT: Parathyrin.intact:MCnc:Pt:Ser/Plas:Qn:: 272.9 — ABNORMAL HIGH

## 2019-08-25 LAB — TRIGLYCERIDES: Triglyceride:MCnc:Pt:Ser/Plas:Qn:: 220 — ABNORMAL HIGH

## 2019-08-25 LAB — PHOSPHORUS: Phosphate:MCnc:Pt:Ser/Plas:Qn:: 3.1

## 2019-08-25 LAB — MICROCYTES

## 2019-08-25 LAB — PARATHYROID HOMONE (PTH): PARATHYROID HORMONE INTACT: 272.9 pg/mL — ABNORMAL HIGH (ref 12.0–72.0)

## 2019-08-25 MED ORDER — LOKELMA 5 GRAM ORAL POWDER PACKET
Freq: Two times a day (BID) | ORAL | 0 days | PRN
Start: 2019-08-25 — End: ?

## 2019-08-25 NOTE — Unmapped (Signed)
Transplant Nephrology Clinic Visit    History of Present Illness    [name] is a 34 y.o. 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 He subsequently underwent another transplant 02/20/19. The renal function was not improving steadily as expected and he under went biopsy end of sep 2020. This did not show any signs of infection. He is following with [place] for post transplant care.     Subjective/Interval:     Patient continues to deny any acute issues, claims to be compliant with medications. Patient has not showed up for the scheduled biopsy twice already and missing clinic visits. Seems hard to get hold of. As per patient lot of family and social issues making it hard for him to come for these appointments. His compliance has been questionable, medications lvls fluctuating and history of previous transplant failure, high PRA makes it high risk candidate for rejection. Explained these to the patient and tried to convince him to get admitted overnight for biopsy. He wants to think about it and promises to come over next week for biopsy.     Assessment:  34 year old male status post deceased donor kidney transplant 02/20/2019 for ESRD secondary to Alport's Disease who presents for routine follow up and post-transplant care.       Recommendations/Plan:     Allograft Function, s/p DDKT 02/20/2019 : DGF    Patient renal function still not improving as expected. At best his Cr has base lined around the 2.5 mark with occasional worsening to the 4 region.     Biopsy sep 2020 which just showed tubular injury. 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 Fluctuating lvls with either higher or lower than target. Questionable non compliance high since tacro lvl read as low as 2.8 at times.     Tacro target: 7-9  Tacro dose: 18mg  envarsus   Tacro last lvl /date: 7.6  - 3/1//21  MMF/MPA dose: 360 bid   Wbc/anc count: 2.3/1.9  Side effects: diarrhea with mpa so lower dosed     ?? Will re challenge him to increase mpa to 360 tid or 540 bid to see if GI effects better  ?? Based on biopsy result will readjust future medication   ?? Impact of tacro on renal function possible since hyper K and acidosis persists.     Anemia due to chronic kidney disease:   Hb still holding around 9 region. Last iron panel was adequate and not needed epo in  the recent months.     BMM and Electrolytes.   Patient with history of pthectomy / calcium/ phos lvl holding stable   Last pth nov 2020 288   K with tendency to run higher and needing lokelma support.     Blood Pressure Management: BP running 140+ at home as per patient. Will add chlorthalidone 25 to help with htn and kaliuresis     Infectious Prophylaxis  CMV D+/R+  EBV D+/R+  Continues with valcyte 450 daily /  As of now continue with atovaquone for pjp,  check G6PD lvl and continue to avoid bactrim since high K      Health Maintenance:    See Dermatology yearly    Immunizations  uptodate  Covid pending     Transplant History:  ??  Organ Received: Left DDT, DCD, KDPI: 36%; 16 hrs cold ischemia time  Native Kidney Disease: Alport's Syndrome  CPRA: 87  Date of Transplant: 02/20/19  Post-Transplant Course: DGF, last hemodialysis on 03/12/19--discontinued after increase in urine output with stable creatinine.  Prior Transplants: Yes  Induction: [place]  Date of Ureteral Stent Removal: 03/28/2019  Current Immunosuppression: Envarsus/Myfrotic  CMV/EBV Status: CMV D+/R+  EBV D+/R+  Valcyte 450 mg twice a week  Rejection Episodes: None  Donor Specific Antibodies: None  Results of Renal Imaging (pre and post):   Pre-Transplant 03/22/18  Small echogenic kidneys bilaterally, consistent with medical renal disease. 2 subcentimeter left renal cysts measuring up to 0.6 x 0.6 x 0.7 cm. No solid masses or calculi. No hydronephrosis  ??  Post-Transplant 02/20/19 (day of txp)  -Resistive indices in the left lower quadrant renal transplant arteries within normal limits.??  -Mildly increase in velocities noted within the main renal artery at the level of the anastomosis, likely postsurgical. Attention on follow-up.??  -Small perinephric fluid collection noted along the inferior pole transplant kidney measuring 2.3 x 3.2 x 0.8 cm as well as small amount of free fluid seen in the left upper quadrant, likely postoperative.     03/14/19 Kidney biopsy showed diffuse subacute tubular injury, no evidence for rejection (C4d negative)        Past Medical History  1. Alport's Syndrome  2. Anemia of CKD  3. Kidney Transplant DBD, [facility] 11/12/2007   A. Failed 11/24/2013  4. HTN  5. GERD    Review of Systems    Otherwise as per HPI, all other systems reviewed and are negative.    Medications  Current Outpatient Medications   Medication Sig Dispense Refill   ??? acetaminophen (TYLENOL) 500 MG tablet Take 1-2 tablets (500-1,000 mg total) by mouth every six (6) hours as needed for pain or fever (> 38C). 100 tablet 11   ??? amLODIPine (NORVASC) 5 MG tablet Take 2 tablets (10 mg total) by mouth daily. 60 tablet 11   ??? carvediloL (COREG) 6.25 MG tablet TAKE 1 TABLET (6.25 MG TOTAL) BY MOUTH TWO (2) TIMES A DAY. 180 tablet 3   ??? chlorthalidone (HYGROTON) 25 MG tablet Take 1 tablet (25 mg total) by mouth every morning. 30 tablet 11   ??? famotidine (PEPCID) 20 MG tablet Take 1 tablet (20 mg total) by mouth daily. 30 tablet 5   ??? hydrOXYzine (ATARAX) 25 MG tablet Take 1 tablet (25 mg total) by mouth every eight (8) hours as needed for itching. 30 tablet 11   ??? magnesium oxide-Mg AA chelate (MAGNESIUM, AMINO ACID CHELATE,) 133 mg Tab Take 1 tablet by mouth daily. 30 tablet 11   ???  mycophenolate (MYFORTIC) 180 MG EC tablet Take 2 tablets (360 mg total) by mouth Two (2) times a day. 120 tablet 11   ??? sodium bicarbonate 650 mg tablet Take 2 tablets (1,300 mg total) by mouth Two (2) times a day. 120 tablet 11   ??? sodium zirconium cyclosilicate (LOKELMA) 5 gram PwPk packet Take 2 packets (10 g total) by mouth two (2) times a day as needed.     ??? tacrolimus (ENVARSUS XR) 1 mg Tb24 extended release tablet Take 2 tablets (2 mg total) by mouth daily. Take with the 4 tablets (4 mg) for a total of 18 mg daily 60 tablet 11   ??? tacrolimus (ENVARSUS XR) 4 mg Tb24 extended release tablet Take 4 tablets (16 mg total) by mouth daily. Take with 2 (1mg ) tablets for a total of 18 mg daily 120 tablet 3   ??? zolpidem (AMBIEN) 10 mg tablet Take 10 mg by mouth nightly as needed for sleep.     ??? aspirin (ECOTRIN) 81 MG tablet HOLD until directed to start in clinic. (Patient not taking: Reported on 08/25/2019) 30 tablet 11     No current facility-administered medications for this visit.            Physical Exam  Vitals:    08/25/19 1416   BP: 127/87   Pulse: 80   Temp: 37 ??C (98.6 ??F)   SpO2: 100%      Gen: built for age , without no distress,   Eyes: no pallor no icterus   ENT: wearing mask   Neck: wearing mask   CVS: s1 s2 heard, rhythm normal, no murmur   Resp: air entry b/l no creps   Abd: soft non tender   Ext: no edema, no muscle wasting   Cns:  Aaox3, no tremor,grossly intact   Skin:  No new rash, no lesions       Laboratory Data and Imaging reviewed in EPIC  Reviewed     Counseling:  I counseled the patient on:  The need to avoid sun exposure and the use of sunblock while outdoors given the relatively higher risk of skin malignancy in an immunosuppressed state.  The need for adherence to immunosuppression medication.  Patient verbalized understanding.     Follow-Up:  Return to clinic in 4 weeks  Patient will continue to follow-up with his primary care provider for non-transplant related issues and medication refills. We have ordered transplant specific labs per the center's guidelines to monitor and assess for toxicities from immunosuppressant drug therapy

## 2019-08-25 NOTE — Unmapped (Signed)
I spent 15 minutes with the patient at his clinic visit.  I reviewed and updated his medications.  He says he has not missed any doses recently and is trying to do better with this kidney then he did with the first one.  He took his medications while I was in the room with him and he is taking the correct dose.  He does not remember what time he took his envarsus yesterday.  He is only having 2-3 loose stools per week and has not needed the imodium.  He denies any swelling.  He is agreement to have a kidney biopsy done next week, he just needs to arrange with work, a ride and someone to get his kids.

## 2019-08-25 NOTE — Unmapped (Signed)
Urine collected and sent to lab.

## 2019-08-26 DIAGNOSIS — Z940 Kidney transplant status: Principal | ICD-10-CM

## 2019-08-26 LAB — CMV DNA, QUANTITATIVE, PCR: CMV QUANT LOG10: 2.33 {Log_IU}/mL — ABNORMAL HIGH (ref <0.00)

## 2019-08-26 LAB — CMV QUANT: Lab: 213 — ABNORMAL HIGH

## 2019-08-26 LAB — TACROLIMUS, TROUGH: Lab: 6.4

## 2019-08-26 LAB — VITAMIN D, TOTAL (25OH): Lab: 11.1 — ABNORMAL LOW

## 2019-08-27 NOTE — Unmapped (Signed)
Pt was scheduled for biopsy and no showed. Note deleted.

## 2019-08-30 LAB — BK VIRUS QUANTITATIVE PCR, BLOOD

## 2019-08-30 LAB — BK BLOOD LOG(10): Lab: 0

## 2019-08-30 NOTE — Unmapped (Signed)
[facility] Refill Coordination Note    Specialty Medication(s) to be Shipped:   Transplant: Envarsus 1mg , Envarsus 4mg  and Myfortic 180mg     Other medication(s) to be shipped: Famotidine 20mg , Amlodipine 5mg , Acetaminophen 500mg      Russell Wade, DOB: Mar 04, 1986  Phone: [phone] (home) [phone] (work)      All above HIPAA information was verified with patient.     Was a Nurse, learning disability used for this call? No    Completed refill call assessment today to schedule patient's medication shipment from the [facility] [phone]).       Specialty medication(s) and dose(s) confirmed: Regimen is correct and unchanged.   Changes to medications: [name] reports no changes at this time.  Changes to insurance: No  Questions for the pharmacist: No    Confirmed patient received Welcome Packet with first shipment. The patient will receive a drug information handout for each medication shipped and additional FDA Medication Guides as required.       DISEASE/MEDICATION-SPECIFIC INFORMATION        N/A    SPECIALTY MEDICATION ADHERENCE     Medication Adherence    Patient reported X missed doses in the last month: 0  Specialty Medication: Envarsus XR 1mg   Patient is on additional specialty medications: Yes  Additional Specialty Medications: Envarsus XR 4mg   Patient Reported Additional Medication X Missed Doses in the Last Month: 0  Patient is on more than two specialty medications: Yes  Specialty Medication: Myfortic 180mg   Patient Reported Additional Medication X Missed Doses in the Last Month: 0  Informant: patient                Envarsus XR 1 mg: 10 days of medicine on hand   Envarsus XR 4 mg: 10 days of medicine on hand   Myfortic 180 mg: 10 days of medicine on hand         SHIPPING     Shipping address confirmed in Epic.     Delivery Scheduled: Yes, Expected medication delivery date: 09/06/19.     Medication will be delivered via UPS to the prescription address in Epic [place]. Russell Mage Russell Wade   [facility] Specialty Technician

## 2019-08-31 LAB — HLA CL2 AB RESULT: Lab: POSITIVE

## 2019-08-31 LAB — HLA DS POST TRANSPLANT
ANTI-DONOR DRW #1 MFI: 33 MFI
ANTI-DONOR DRW #2 MFI: 28 MFI
ANTI-DONOR HLA-A #1 MFI: 2 MFI
ANTI-DONOR HLA-A #2 MFI: 0 MFI
ANTI-DONOR HLA-B #1 MFI: 28 MFI
ANTI-DONOR HLA-C #1 MFI: 0 MFI
ANTI-DONOR HLA-C #2 MFI: 0 MFI
ANTI-DONOR HLA-DQB #2 MFI: 371 MFI
ANTI-DONOR HLA-DR #1 MFI: 27 MFI
ANTI-DONOR HLA-DR #2 MFI: 31 MFI

## 2019-08-31 LAB — HLA CLASS 1 ANTIBODY RESULT: Lab: POSITIVE

## 2019-08-31 LAB — FSAB CLASS 2 ANTIBODY SPECIFICITY: HLA CL2 AB RESULT: POSITIVE

## 2019-08-31 LAB — FSAB CLASS 1 ANTIBODY SPECIFICITY: HLA CLASS 1 ANTIBODY RESULT: POSITIVE

## 2019-08-31 LAB — DONOR HLA-A ANTIGEN #1

## 2019-09-02 NOTE — Unmapped (Signed)
Called to verify pts renal biopsy, pt asked to arrive between 0700-0715, pt not currently taking any blood thinners, NPO status reinforced, pt will have responsible party accompany to appointment, COVID screening negative, all questions answered.

## 2019-09-05 MED FILL — FAMOTIDINE 20 MG TABLET: ORAL | 30 days supply | Qty: 30 | Fill #4

## 2019-09-05 MED FILL — ENVARSUS XR 1 MG TABLET,EXTENDED RELEASE: 30 days supply | Qty: 60 | Fill #2 | Status: AC

## 2019-09-05 MED FILL — ACETAMINOPHEN 500 MG TABLET: ORAL | 13 days supply | Qty: 100 | Fill #4

## 2019-09-05 MED FILL — MYFORTIC 180 MG TABLET,DELAYED RELEASE: 30 days supply | Qty: 120 | Fill #4 | Status: AC

## 2019-09-05 MED FILL — ENVARSUS XR 4 MG TABLET,EXTENDED RELEASE: ORAL | 30 days supply | Qty: 120 | Fill #2

## 2019-09-05 MED FILL — AMLODIPINE 5 MG TABLET: ORAL | 30 days supply | Qty: 60 | Fill #5

## 2019-09-05 MED FILL — ACETAMINOPHEN 500 MG TABLET: 13 days supply | Qty: 100 | Fill #4 | Status: AC

## 2019-09-05 MED FILL — AMLODIPINE 5 MG TABLET: 30 days supply | Qty: 60 | Fill #5 | Status: AC

## 2019-09-05 MED FILL — ENVARSUS XR 4 MG TABLET,EXTENDED RELEASE: 30 days supply | Qty: 120 | Fill #2 | Status: AC

## 2019-09-05 MED FILL — FAMOTIDINE 20 MG TABLET: 30 days supply | Qty: 30 | Fill #4 | Status: AC

## 2019-09-05 MED FILL — ENVARSUS XR 1 MG TABLET,EXTENDED RELEASE: ORAL | 30 days supply | Qty: 60 | Fill #2

## 2019-09-05 MED FILL — MYFORTIC 180 MG TABLET,DELAYED RELEASE: ORAL | 30 days supply | Qty: 120 | Fill #4

## 2019-09-06 ENCOUNTER — Ambulatory Visit: Admit: 2019-09-06 | Discharge: 2019-09-06 | Payer: MEDICARE

## 2019-09-07 NOTE — Unmapped (Signed)
This covering coordinator called patient to follow up on recent scheduled biopsy, that appears patient never showed up for    Left patient message, no answer    Drue Flirt, RN Transplant Nurse Coordinator 09/07/2019 11:39 AM

## 2019-09-26 NOTE — Unmapped (Addendum)
[facility] Clinical Assessment & Refill Coordination Note    Russell Wade, DOB: 1985-12-24  Phone: [phone] (home) [phone] (work)    All above HIPAA information was verified with patient.     Was a Nurse, learning disability used for this call? No    Specialty Medication(s):   Transplant: Envarsus 1mg , Envarsus 4mg  and Myfortic 180mg      Current Outpatient Medications   Medication Sig Dispense Refill   ??? acetaminophen (TYLENOL) 500 MG tablet Take 1-2 tablets (500-1,000 mg total) by mouth every six (6) hours as needed for pain or fever (> 38C). 100 tablet 11   ??? amLODIPine (NORVASC) 5 MG tablet Take 2 tablets (10 mg total) by mouth daily. 60 tablet 11   ??? aspirin (ECOTRIN) 81 MG tablet HOLD until directed to start in clinic. (Patient not taking: Reported on 08/25/2019) 30 tablet 11   ??? carvediloL (COREG) 6.25 MG tablet TAKE 1 TABLET (6.25 MG TOTAL) BY MOUTH TWO (2) TIMES A DAY. 180 tablet 3   ??? chlorthalidone (HYGROTON) 25 MG tablet Take 1 tablet (25 mg total) by mouth every morning. 30 tablet 11   ??? famotidine (PEPCID) 20 MG tablet Take 1 tablet (20 mg total) by mouth daily. 30 tablet 5   ??? hydrOXYzine (ATARAX) 25 MG tablet Take 1 tablet (25 mg total) by mouth every eight (8) hours as needed for itching. 30 tablet 11   ??? magnesium oxide-Mg AA chelate (MAGNESIUM, AMINO ACID CHELATE,) 133 mg Tab Take 1 tablet by mouth daily. 30 tablet 11   ??? mycophenolate (MYFORTIC) 180 MG EC tablet Take 2 tablets (360 mg total) by mouth Two (2) times a day. 120 tablet 11   ??? sodium bicarbonate 650 mg tablet Take 2 tablets (1,300 mg total) by mouth Two (2) times a day. 120 tablet 11   ??? sodium zirconium cyclosilicate (LOKELMA) 5 gram PwPk packet Take 2 packets (10 g total) by mouth two (2) times a day as needed.     ??? tacrolimus (ENVARSUS XR) 1 mg Tb24 extended release tablet Take 2 tablets (2 mg total) by mouth daily. Take with the 4 tablets (4 mg) for a total of 18 mg daily 60 tablet 11   ??? tacrolimus (ENVARSUS XR) 4 mg Tb24 extended release tablet Take 4 tablets (16 mg total) by mouth daily. Take with 2 (1mg ) tablets for a total of 18 mg daily 120 tablet 3   ??? zolpidem (AMBIEN) 10 mg tablet Take 10 mg by mouth nightly as needed for sleep.       No current facility-administered medications for this visit.        Changes to medications: Brighten reports no changes at this time.    Allergies   Allergen Reactions   ??? Tuberculin Ppd      Other reaction(s): Skin Rash       Changes to allergies: No    SPECIALTY MEDICATION ADHERENCE     envarsus 1mg   : 10 days of medicine on hand   envarsus 4mg   : 10 days of medicine on hand   Myfortic 180mg   : 10 days of medicine on hand     Medication Adherence    Patient reported X missed doses in the last month: 0  Specialty Medication: envarsus 1mg   Patient is on additional specialty medications: Yes  Additional Specialty Medications: envarsus 4mg   Patient Reported Additional Medication X Missed Doses in the Last Month: 0  Patient is on more than two  specialty medications: Yes  Specialty Medication: myfortic 180mg   Patient Reported Additional Medication X Missed Doses in the Last Month: 0          Specialty medication(s) dose(s) confirmed: pt states envarsus is now 17mg  daily - requesting new rx from clinic     Are there any concerns with adherence? No    Adherence counseling provided? Not needed    CLINICAL MANAGEMENT AND INTERVENTION      Clinical Benefit Assessment:    Do you feel the medicine is effective or helping your condition? Yes    Clinical Benefit counseling provided? Not needed    Adverse Effects Assessment:    Are you experiencing any side effects? pt reports shaking hands over the last 2 months- messaging clinic today and advised him to send mychart message as well    Are you experiencing difficulty administering your medicine? No    Quality of Life Assessment:    How many days over the past month did your transplant  keep you from your normal activities? For example, brushing your teeth or getting up in the morning. 0    Have you discussed this with your provider? Not needed    Therapy Appropriateness:    Is therapy appropriate? Yes, therapy is appropriate and should be continued    DISEASE/MEDICATION-SPECIFIC INFORMATION      N/A    PATIENT SPECIFIC NEEDS     - Does the patient have any physical, cognitive, or cultural barriers? No    - Is the patient high risk? Yes, patient is taking a REMS drug. Medication is dispensed in compliance with REMS program.     - Does the patient require a Care Management Plan? No     - Does the patient require physician intervention or other additional services (i.e. nutrition, smoking cessation, social work)? No      SHIPPING     Specialty Medication(s) to be Shipped:   Transplant: Envarsus 1mg , Envarsus 4mg  and Myfortic 180mg     Other medication(s) to be shipped: norvasc, pepcid, mgplus, sod bicarb  Pt declined lokelma and all other refills     Changes to insurance: No    Delivery Scheduled: Yes, Expected medication delivery date: 10/03/2019.  However, Rx request for refills was sent to the provider as there are none remaining.     Medication will be delivered via UPS to the confirmed prescription address in [facility] [place]    The patient will receive a drug information handout for each medication shipped and additional FDA Medication Guides as required.  Verified that patient has previously received a Conservation officer, historic buildings.    All of the patient's questions and concerns have been addressed.    [name]   [facility] Specialty Pharmacist

## 2019-09-27 DIAGNOSIS — D899 Disorder involving the immune mechanism, unspecified: Principal | ICD-10-CM

## 2019-09-27 DIAGNOSIS — Z940 Kidney transplant status: Principal | ICD-10-CM

## 2019-09-27 MED ORDER — TACROLIMUS XR 1 MG TABLET,EXTENDED RELEASE 24 HR
ORAL_TABLET | Freq: Every day | ORAL | 11 refills | 30.00000 days | Status: CP
Start: 2019-09-27 — End: ?

## 2019-09-27 MED ORDER — TACROLIMUS XR 4 MG TABLET,EXTENDED RELEASE 24 HR
ORAL_TABLET | Freq: Every day | ORAL | 3 refills | 30.00000 days | Status: CP
Start: 2019-09-27 — End: ?
  Filled 2019-09-30: qty 120, 30d supply, fill #0

## 2019-09-28 NOTE — Unmapped (Signed)
Change in Envarsus dosage increase from 17 mg to 18 mg daily. Co-pay $0.00 for both 1 and 4 mg. Previous dose was scheduled to fill 09/30/2019 and new dose will need to be rescheduled for delivery.

## 2019-09-29 ENCOUNTER — Ambulatory Visit: Admit: 2019-09-29 | Discharge: 2019-09-30 | Payer: MEDICARE

## 2019-09-29 DIAGNOSIS — Z940 Kidney transplant status: Principal | ICD-10-CM

## 2019-09-29 DIAGNOSIS — Z79899 Other long term (current) drug therapy: Principal | ICD-10-CM

## 2019-09-29 DIAGNOSIS — R8279 Other abnormal findings on microbiological examination of urine: Secondary | ICD-10-CM | POA: Diagnosis not present

## 2019-09-29 LAB — CBC W/ AUTO DIFF
BASOPHILS ABSOLUTE COUNT: 0 10*9/L (ref 0.0–0.1)
BASOPHILS RELATIVE PERCENT: 1.2 %
EOSINOPHILS ABSOLUTE COUNT: 0.4 10*9/L (ref 0.0–0.7)
EOSINOPHILS RELATIVE PERCENT: 9.9 %
HEMATOCRIT: 32.3 % — ABNORMAL LOW (ref 38.0–50.0)
HEMOGLOBIN: 11.1 g/dL — ABNORMAL LOW (ref 13.5–17.5)
LYMPHOCYTES RELATIVE PERCENT: 12.2 %
MEAN CORPUSCULAR HEMOGLOBIN CONC: 34.5 g/dL (ref 30.0–36.0)
MEAN CORPUSCULAR HEMOGLOBIN: 28.4 pg (ref 26.0–34.0)
MEAN PLATELET VOLUME: 8.1 fL (ref 7.0–10.0)
MONOCYTES ABSOLUTE COUNT: 0.6 10*9/L (ref 0.1–1.0)
MONOCYTES RELATIVE PERCENT: 15.3 %
NUCLEATED RED BLOOD CELLS: 0 /100{WBCs} (ref <=4)
PLATELET COUNT: 217 10*9/L (ref 150–450)
RED BLOOD CELL COUNT: 3.92 10*12/L — ABNORMAL LOW (ref 4.32–5.72)
RED CELL DISTRIBUTION WIDTH: 16.3 % — ABNORMAL HIGH (ref 12.0–15.0)
WBC ADJUSTED: 3.9 10*9/L (ref 3.5–10.5)

## 2019-09-29 LAB — URINALYSIS
BACTERIA: NONE SEEN /HPF
BILIRUBIN UA: NEGATIVE
GLUCOSE UA: NEGATIVE
KETONES UA: NEGATIVE
LEUKOCYTE ESTERASE UA: NEGATIVE
NITRITE UA: NEGATIVE
PH UA: 7 (ref 5.0–9.0)
PROTEIN UA: NEGATIVE
RBC UA: 0 /HPF (ref <3)
SPECIFIC GRAVITY UA: 1.01 (ref 1.005–1.040)
SQUAMOUS EPITHELIAL: 0 /HPF (ref 0–5)
UROBILINOGEN UA: 0.2
WBC UA: 2 /HPF — ABNORMAL HIGH (ref <2)

## 2019-09-29 LAB — COMPREHENSIVE METABOLIC PANEL
ALBUMIN: 5 g/dL (ref 3.5–5.0)
ALKALINE PHOSPHATASE: 202 U/L — ABNORMAL HIGH (ref 38–126)
ALT (SGPT): 38 U/L (ref <50)
ANION GAP: 12 mmol/L (ref 7–15)
AST (SGOT): 27 U/L (ref 19–55)
BILIRUBIN TOTAL: 0.5 mg/dL (ref 0.0–1.2)
BLOOD UREA NITROGEN: 31 mg/dL — ABNORMAL HIGH (ref 7–21)
BUN / CREAT RATIO: 13
CHLORIDE: 102 mmol/L (ref 98–107)
CO2: 26 mmol/L (ref 22.0–30.0)
CREATININE: 2.42 mg/dL — ABNORMAL HIGH (ref 0.70–1.30)
EGFR CKD-EPI AA MALE: 39 mL/min/{1.73_m2} — ABNORMAL LOW (ref >=60)
EGFR CKD-EPI NON-AA MALE: 34 mL/min/{1.73_m2} — ABNORMAL LOW (ref >=60)
GLUCOSE RANDOM: 97 mg/dL (ref 70–179)
POTASSIUM: 4.8 mmol/L (ref 3.5–5.0)
SODIUM: 140 mmol/L (ref 135–145)

## 2019-09-29 LAB — PROTEIN / CREATININE RATIO, URINE: PROTEIN URINE: 4.4 mg/dL

## 2019-09-29 LAB — MAGNESIUM: Magnesium:MCnc:Pt:Ser/Plas:Qn:: 1.5 — ABNORMAL LOW

## 2019-09-29 LAB — CREATININE, URINE: Lab: 47.7

## 2019-09-29 LAB — HEMOGLOBIN: Hemoglobin:MCnc:Pt:Bld:Qn:: 11.1 — ABNORMAL LOW

## 2019-09-29 LAB — GLUCOSE UA: Glucose:MCnc:Pt:Urine:Qn:Test strip: NEGATIVE

## 2019-09-29 LAB — PHOSPHORUS: Phosphate:MCnc:Pt:Ser/Plas:Qn:: 3.1

## 2019-09-29 LAB — ALKALINE PHOSPHATASE: Alkaline phosphatase:CCnc:Pt:Ser/Plas:Qn:: 202 — ABNORMAL HIGH

## 2019-09-30 LAB — CMV DNA, QUANTITATIVE, PCR
CMV QUANT: <50 [IU]/mL — ABNORMAL HIGH (ref <0)
CMV VIRAL LD: DETECTED — AB

## 2019-09-30 LAB — TACROLIMUS, TROUGH: Lab: 5

## 2019-09-30 LAB — CMV COMMENT: Lab: 0

## 2019-09-30 MED FILL — MYFORTIC 180 MG TABLET,DELAYED RELEASE: 30 days supply | Qty: 120 | Fill #5 | Status: AC

## 2019-09-30 MED FILL — ENVARSUS XR 1 MG TABLET,EXTENDED RELEASE: 30 days supply | Qty: 60 | Fill #0 | Status: AC

## 2019-09-30 MED FILL — SODIUM BICARBONATE 650 MG TABLET: 30 days supply | Qty: 120 | Fill #5 | Status: AC

## 2019-09-30 MED FILL — ENVARSUS XR 4 MG TABLET,EXTENDED RELEASE: 30 days supply | Qty: 120 | Fill #0 | Status: AC

## 2019-09-30 MED FILL — AMLODIPINE 5 MG TABLET: 30 days supply | Qty: 60 | Fill #6 | Status: AC

## 2019-09-30 MED FILL — ACETAMINOPHEN 500 MG TABLET: ORAL | 13 days supply | Qty: 100 | Fill #5

## 2019-09-30 MED FILL — ACETAMINOPHEN 500 MG TABLET: 13 days supply | Qty: 100 | Fill #5 | Status: AC

## 2019-09-30 MED FILL — AMLODIPINE 5 MG TABLET: ORAL | 30 days supply | Qty: 60 | Fill #6

## 2019-09-30 MED FILL — SODIUM BICARBONATE 650 MG TABLET: ORAL | 30 days supply | Qty: 120 | Fill #5

## 2019-09-30 MED FILL — MYFORTIC 180 MG TABLET,DELAYED RELEASE: ORAL | 30 days supply | Qty: 120 | Fill #5

## 2019-09-30 MED FILL — MG-PLUS-PROTEIN 133 MG TABLET: ORAL | 30 days supply | Qty: 30 | Fill #3

## 2019-09-30 MED FILL — MG-PLUS-PROTEIN 133 MG TABLET: 30 days supply | Qty: 30 | Fill #3 | Status: AC

## 2019-09-30 MED FILL — FAMOTIDINE 20 MG TABLET: 30 days supply | Qty: 30 | Fill #5 | Status: AC

## 2019-09-30 MED FILL — FAMOTIDINE 20 MG TABLET: ORAL | 30 days supply | Qty: 30 | Fill #5

## 2019-09-30 MED FILL — ENVARSUS XR 1 MG TABLET,EXTENDED RELEASE: ORAL | 30 days supply | Qty: 60 | Fill #0

## 2019-10-05 LAB — BK BLOOD COMMENT: Lab: 0

## 2019-10-05 LAB — BK VIRUS QUANTITATIVE PCR, BLOOD

## 2019-10-07 LAB — FSAB CLASS 2 ANTIBODY SPECIFICITY

## 2019-10-07 LAB — HLA DS POST TRANSPLANT
ANTI-DONOR DRW #1 MFI: 26 MFI
ANTI-DONOR DRW #2 MFI: 26 MFI
ANTI-DONOR HLA-A #1 MFI: 29 MFI
ANTI-DONOR HLA-B #1 MFI: 22 MFI
ANTI-DONOR HLA-B #2 MFI: 0 MFI
ANTI-DONOR HLA-C #1 MFI: 0 MFI
ANTI-DONOR HLA-C #2 MFI: 52 MFI
ANTI-DONOR HLA-DQB #1 MFI: 344 MFI
ANTI-DONOR HLA-DQB #2 MFI: 344 MFI
ANTI-DONOR HLA-DR #1 MFI: 43 MFI
ANTI-DONOR HLA-DR #2 MFI: 37 MFI

## 2019-10-07 LAB — DONOR HLA-B ANTIGEN #1

## 2019-10-07 LAB — FSAB CLASS 1 ANTIBODY SPECIFICITY: HLA CLASS 1 ANTIBODY RESULT: POSITIVE

## 2019-10-07 LAB — HLA CL1 ANTIBODY COMM: Lab: 0

## 2019-10-07 LAB — HLA CL2 AB COMMENT: Lab: 0

## 2019-10-19 NOTE — Unmapped (Signed)
Transplant Nephrology Clinic Visit    History of Present Illness    34 y.o. male here for follow up after kidney transplantation.  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  Patient received a deceased donor kidney transplant on 11/12/2007.  Patient had late cellular rejection in 2011.  He had a transplant nephrectomy on 03/20/2014  Restarted hemodialysis on 2013-11-27.     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 He had delayed graft function after transplant. On POD 1, patient started on dialysis due to low urine output. He received 2 sessions inpatient. Patient discharged on 02/23/19.  Last day of dialysis on 03/06/19.       Transplant History:    Organ Received: Left DDT, DCD, KDPI: 36%; 16 hrs cold ischemia time  Zero Hour Biopsy: No diagnostic abnormalities recognized  Native Kidney Disease: Alport's Syndrome; CPRA: 87  Date of Transplant: 02/20/19  Post-Transplant Course: DGF, Dialysis  Prior Transplants: Yes  Induction: [facility]  Date of Ureteral Stent Removal: 03/28/2019  CMV/EBV Status: CMV D+/R+  EBV D+/R+  Rejection Episodes: None  Donor Specific Antibodies: None  Results of Renal Imaging (pre and post):   Pre-Transplant 03/22/18  Small echogenic kidneys bilaterally, consistent with medical renal disease. 2 subcentimeter left renal cysts measuring up to 0.6 x 0.6 x 0.7 cm. No solid masses or calculi. No hydronephrosis    Post-Transplant 02/20/19 (day of txp)  -Resistive indices in the left lower quadrant renal transplant arteries within normal limits.??  -Mildly increase in velocities noted within the main renal artery at the level of the anastomosis, likely postsurgical. Attention on follow-up.??  -Small perinephric fluid collection noted along the inferior pole transplant kidney measuring 2.3 x 3.2 x 0.8 cm as well as small amount of free fluid seen in the left upper quadrant, likely postoperative.      Current Immunosuppression Regimen:   Envarsus 18 mg daily  Myfortic 360 mg BID    Subjective/Interval:   Since last visit, patient was scheduled for a kidney biopsy on 09/05/19, but patient no showed.     Today patient is here for follow up.  He states he is currently working in [place] but will go back to [place] in a week or so once this job is completed.  He is working 7p-7a shift. He states he is drinking about 3-4 bottles of water a day. He endorses a mild hand tremor at times. Home BP stable at home per patient.  Per patient, about 2-3 weeks ago he had some pain at his kidney/graft site but has since resolved. Patient not able to come to biopsy due to not having someone who can stay all day with him.  He denies headaches, dizziness, chest pain, shortness of breath, abdominal pain, n/v/d, edema, dysuria, hematuria, pain/burning with urination and odor.     Last dose of Prograf: 8-10 am    Review of Systems    Otherwise as per HPI, all other systems reviewed and are negative.    Past Medical History  1. Kidney Transplant #1: Left DDKT; DBD, SCD, PHS increased risk; 26.5 hours cold ischemia time; Induction: Camp[facility]  ?? Medication non-compliance  ?? CMV Viremia  ?? DSA to DR51  ?? 11/23/09 Biopsy - Acute Rejection; treated with Thymo x 5 days, Solumedrol 500mg  x 3 days; Pred taper then 10mg  x 30 days  ?? 12/28/09 Biopsy with bleed - acute cellular rejection, less severe than previous with  patchy lymphocytic infiltrate, some ATN, no endarteritis, no glomerulitis; Solumedrol 500mg  x 3 days; Pred taper  ?? 01/22/10 Biopsy - Smoldering cellular rejection; Solumedrol 500mg  x 3 days  ?? 03/07/11 Biopsy - mild interstitial fibrosis and tubular atrophy  ?? 01/10/13 Biopsy - severe cellular rejection and antibody mediated rejection; Thymo x 10 days, Solumedrol 500mg  x 3 days, Plex x 6, IVIG (2 high, 1 low) x 3, Rituximab x 1  ?? 02/16/13 Biopsy - Acute cellular without antibody mediated rejection; Thymo x 10 and Solumedrol x 3  ?? 03/23/13 Biopsy - Less inflammation; smoldering rejection  ?? Dialysis started 11/24/13  ?? Transplant Nephrectomy 03/20/14  2. Hypertension  3. Alport's syndrome  4. S/p parathyroidectomy.        Medications  Current Outpatient Medications   Medication Sig Dispense Refill   ??? acetaminophen (TYLENOL) 500 MG tablet Take 1-2 tablets (500-1,000 mg total) by mouth every six (6) hours as needed for pain or fever (> 38C). 100 tablet 11   ??? amLODIPine (NORVASC) 5 MG tablet Take 2 tablets (10 mg total) by mouth daily. 60 tablet 11   ??? carvediloL (COREG) 6.25 MG tablet TAKE 1 TABLET (6.25 MG TOTAL) BY MOUTH TWO (2) TIMES A DAY. 180 tablet 3   ??? chlorthalidone (HYGROTON) 25 MG tablet Take 1 tablet (25 mg total) by mouth every morning. 30 tablet 11   ??? famotidine (PEPCID) 20 MG tablet Take 1 tablet (20 mg total) by mouth daily. 30 tablet 5   ??? hydrOXYzine (ATARAX) 25 MG tablet Take 1 tablet (25 mg total) by mouth every eight (8) hours as needed for itching. 30 tablet 11   ??? magnesium oxide-Mg AA chelate (MAGNESIUM, AMINO ACID CHELATE,) 133 mg Tab Take 1 tablet by mouth daily. 30 tablet 11   ??? mycophenolate (MYFORTIC) 180 MG EC tablet Take 2 tablets (360 mg total) by mouth Two (2) times a day. 120 tablet 11   ??? sodium bicarbonate 650 mg tablet Take 2 tablets (1,300 mg total) by mouth Two (2) times a day. 120 tablet 11   ??? sodium zirconium cyclosilicate (LOKELMA) 5 gram PwPk packet Take 2 packets (10 g total) by mouth two (2) times a day as needed.     ??? tacrolimus (ENVARSUS XR) 1 mg Tb24 extended release tablet Take 2 tablets (2 mg total) by mouth daily with 4 (4 mg) tablets for a total dose of 18 mg daily 60 tablet 11   ??? tacrolimus (ENVARSUS XR) 4 mg Tb24 extended release tablet Take 4 tablets (16 mg total) by mouth daily with 2 (1mg ) tablets for a total dose of 18 mg daily 120 tablet 3   ??? zolpidem (AMBIEN) 10 mg tablet Take 10 mg by mouth nightly as needed for sleep.     ??? aspirin (ECOTRIN) 81 MG tablet HOLD until directed to start in clinic. (Patient not taking: Reported on 08/25/2019) 30 tablet 11     No current facility-administered medications for this visit.           Physical Exam  BP 123/85 (BP Site: R Arm, BP Position: Sitting, BP Cuff Size: Medium)  - Pulse 79  - Temp 36.5 ??C (97.7 ??F) (Temporal)  - Ht 175.3 cm (5' 9)  - Wt 68.8 kg (151 lb 9.6 oz)  - BMI 22.39 kg/m??   General: no acute distress  HEENT: wearing mask, PERRL  Neck: neck supple, no cervical lymphadenopathy appreciated  CV: normal rate, normal rhythm, no murmur, no gallops, no  rubs appreciated  Lungs: clear to auscultation bilaterally  Abdomen: soft, non tender with healed incision.  Bowel sounds present in all quadrants.   Extremities:  no edema, LFA AVF +Bruit/+Thrill  Musculoskeletal: no visible deformity, normal range of motion.  Pulses: intact distally throughout  Neurologic: awake, alert, and oriented x3      Laboratory Data and Imaging reviewed in EPIC      Assessment:  34 year old male status post deceased kidney transplant #2 on 02/20/2019 for primary ESRD secondary to Alport's syndrome who presents for routine follow up and post-transplant care.        Recommendations/Plan:     Allograft Function: Renal function 2.44 with baseline of approximately 2.4-2.8 since transplant.  No DSA's at this time (09/29/19).  UPC 0.094.    Kidney Biopsy 03/14/2019: Diffuse subacute tubular injury.  No evidence for rejection.    Immunosuppression Management [High Risk Medical Decision Making For Drug Therapy Requiring Intensive Monitoring For Toxicity]: Tacrolimus trough level pending.  Will be inaccurate as patient already took meds this morning.  Targeting tacrolimus trough levels of approximately 7-9 ng/mL. Envarsus 18 mg daily. Continue Myfortic 360 mg BID.      Blood Pressure Management: BP today is 123/85.   Continue current regimen as BP is stable at home.     Lipid Management: Last lipid panel on 08/25/19.  Patient is not on a statin.  Will continue to monitor.     Infectious Prophylaxis and Monitoring: CMV VL < 50 on 09/29/19, with today's pending.  No decoy cells 09/29/19, with today's pending. BK VL not detected 09/29/19.      Health Maintenance:   None    Immunizations:   Flu Shot: 03/12/2019  Prevnar 13: unknown  Pneumovax: unknown  Covid-19: has not gotten yet      Counseling:  I counseled the patient on:  The need to avoid sun exposure and the use of sunblock while outdoors given the relatively higher risk of skin malignancy in an immunosuppressed state.  The need for adherence to immunosuppression medication.  Patient verbalized understanding.     Follow-Up:  Return to clinic in 4 weeks with Dr. Carlene Coria at [facility]: monthly  Patient will continue to follow-up with his primary care provider for non-transplant related issues and medication refills. We have ordered transplant specific labs per the center's guidelines to monitor and assess for toxicities from immunosuppressant drug therapy

## 2019-10-20 ENCOUNTER — Ambulatory Visit: Admit: 2019-10-20 | Discharge: 2019-10-20 | Payer: MEDICARE

## 2019-10-20 DIAGNOSIS — Z940 Kidney transplant status: Principal | ICD-10-CM

## 2019-10-20 DIAGNOSIS — Z79899 Other long term (current) drug therapy: Principal | ICD-10-CM

## 2019-10-20 DIAGNOSIS — D849 Immunodeficiency, unspecified: Principal | ICD-10-CM

## 2019-10-20 DIAGNOSIS — Z4822 Encounter for aftercare following kidney transplant: Secondary | ICD-10-CM | POA: Diagnosis not present

## 2019-10-20 DIAGNOSIS — Z992 Dependence on renal dialysis: Secondary | ICD-10-CM | POA: Diagnosis not present

## 2019-10-20 DIAGNOSIS — N186 End stage renal disease: Secondary | ICD-10-CM | POA: Diagnosis not present

## 2019-10-20 DIAGNOSIS — I120 Hypertensive chronic kidney disease with stage 5 chronic kidney disease or end stage renal disease: Secondary | ICD-10-CM | POA: Diagnosis not present

## 2019-10-20 DIAGNOSIS — Z7982 Long term (current) use of aspirin: Secondary | ICD-10-CM | POA: Diagnosis not present

## 2019-10-20 DIAGNOSIS — Q8781 Alport syndrome: Secondary | ICD-10-CM | POA: Diagnosis not present

## 2019-10-20 LAB — CBC W/ AUTO DIFF
BASOPHILS ABSOLUTE COUNT: 0 10*9/L (ref 0.0–0.1)
BASOPHILS RELATIVE PERCENT: 0.7 %
EOSINOPHILS ABSOLUTE COUNT: 0.3 10*9/L (ref 0.0–0.4)
EOSINOPHILS RELATIVE PERCENT: 8.4 %
HEMATOCRIT: 37.6 % — ABNORMAL LOW (ref 41.0–53.0)
HEMOGLOBIN: 11.8 g/dL — ABNORMAL LOW (ref 13.5–17.5)
LARGE UNSTAINED CELLS: 4 % (ref 0–4)
LYMPHOCYTES ABSOLUTE COUNT: 0.5 10*9/L — ABNORMAL LOW (ref 1.5–5.0)
LYMPHOCYTES RELATIVE PERCENT: 14 %
MEAN CORPUSCULAR HEMOGLOBIN: 27.1 pg (ref 26.0–34.0)
MEAN CORPUSCULAR VOLUME: 86.6 fL (ref 80.0–100.0)
MONOCYTES ABSOLUTE COUNT: 0.3 10*9/L (ref 0.2–0.8)
MONOCYTES RELATIVE PERCENT: 8.7 %
NEUTROPHILS ABSOLUTE COUNT: 2.2 10*9/L (ref 2.0–7.5)
NEUTROPHILS RELATIVE PERCENT: 64.3 %
PLATELET COUNT: 202 10*9/L (ref 150–440)
RED BLOOD CELL COUNT: 4.35 10*12/L — ABNORMAL LOW (ref 4.50–5.90)
WBC ADJUSTED: 3.4 10*9/L — ABNORMAL LOW (ref 4.5–11.0)

## 2019-10-20 LAB — COMPREHENSIVE METABOLIC PANEL
ALBUMIN: 4.9 g/dL (ref 3.5–5.0)
ALKALINE PHOSPHATASE: 185 U/L — ABNORMAL HIGH (ref 38–126)
ALT (SGPT): 18 U/L (ref <50)
ANION GAP: 7 mmol/L (ref 7–15)
BILIRUBIN TOTAL: 0.4 mg/dL (ref 0.0–1.2)
BLOOD UREA NITROGEN: 34 mg/dL — ABNORMAL HIGH (ref 7–21)
BUN / CREAT RATIO: 14
CALCIUM: 10.3 mg/dL — ABNORMAL HIGH (ref 8.5–10.2)
CHLORIDE: 104 mmol/L (ref 98–107)
CO2: 26 mmol/L (ref 22.0–30.0)
CREATININE: 2.44 mg/dL — ABNORMAL HIGH (ref 0.70–1.30)
EGFR CKD-EPI AA MALE: 39 mL/min/{1.73_m2} — ABNORMAL LOW (ref >=60)
EGFR CKD-EPI NON-AA MALE: 33 mL/min/{1.73_m2} — ABNORMAL LOW (ref >=60)
GLUCOSE RANDOM: 127 mg/dL (ref 70–179)
POTASSIUM: 4.6 mmol/L (ref 3.5–5.0)
PROTEIN TOTAL: 7.9 g/dL (ref 6.5–8.3)
SODIUM: 137 mmol/L (ref 135–145)

## 2019-10-20 LAB — URINALYSIS
BACTERIA: NONE SEEN /HPF
BILIRUBIN UA: NEGATIVE
BLOOD UA: NEGATIVE
GLUCOSE UA: NEGATIVE
KETONES UA: NEGATIVE
LEUKOCYTE ESTERASE UA: NEGATIVE
PROTEIN UA: NEGATIVE
RBC UA: <1 /HPF (ref <=3)
SPECIFIC GRAVITY UA: 1.013 (ref 1.003–1.030)
SQUAMOUS EPITHELIAL: <1 /HPF (ref 0–5)
UROBILINOGEN UA: 0.2
WBC UA: <1 /HPF (ref <=2)

## 2019-10-20 LAB — UROBILINOGEN UA: Lab: 0.2

## 2019-10-20 LAB — PROTEIN / CREATININE RATIO, URINE
CREATININE, URINE: 85.1 mg/dL
PROTEIN URINE: 8 mg/dL

## 2019-10-20 LAB — PROTEIN URINE: Protein:MCnc:Pt:Urine:Qn:: 8

## 2019-10-20 LAB — PHOSPHORUS: Phosphate:MCnc:Pt:Ser/Plas:Qn:: 2.9

## 2019-10-20 LAB — LYMPHOCYTES RELATIVE PERCENT: Lymphocytes/100 leukocytes:NFr:Pt:Bld:Qn:Automated count: 14

## 2019-10-20 LAB — MAGNESIUM: Magnesium:MCnc:Pt:Ser/Plas:Qn:: 1.8

## 2019-10-20 LAB — BUN / CREAT RATIO: Urea nitrogen/Creatinine:MRto:Pt:Ser/Plas:Qn:: 14

## 2019-10-20 NOTE — Unmapped (Signed)
Transplant Coordinator, Clinic Visit   This covering coordinator met with the patient and Pt seen today by transplant nephrology for follow up, reviewed medications and symptoms.          10/20/19 1355   BP: 123/85   Pulse: 79   Temp: 36.5 ??C (97.7 ??F)   Weight: 68.8 kg (151 lb 9.6 oz)   Height: 175.3 cm (5' 9)   PainSc: 0-No pain       Assessment  BP: haven't been checking it at home and when he does its normal.   BG: none   Headache: none  Hand tremors: slightly, certain times its worse    Numbness/tingling: none  Fevers: none  Chills/sweats: none  Shortness of breath: none  Chest pain or pressure: none  Palpitations: none  Nausea/vomiting: none  Diarrhea/constipation: pt. Experiencing diarrhea every so often per his report, it has recently increased the last week, suggest it could be diet related.   UTI symptoms: none  Swelling: none in clinic today but patient has been reporting noting he's holding onto fluids a bit and will know the feeling.   Sleep: sleeping fine  Pain: none       Good appetite; reports adequate hydration.     Intake: 3- 4 bottles of fluids and juice   Output: voids regularly     Any new medications? None   Immunosuppressant last taken: 5/5 morning   Patient usually takes his IS around 8-10 am daily.   Labs at [facility]     Immunization status: patient has not received COVID vaccine at this time, but educated on it.     Functional Score: 100   Normal no complaints; no evidence of  disease.   Employment status is: works full time    I spent a total of 5 minutes with Russell Wade reviewing medications and symptoms.      Patient instructed to have a follow up visit in 1 mont in [place].   Patient educated on getting labs done straight from work, since he works night shift.   Pt. Verbalizes understanding. Pt. Has supply of medications at home and denies any barriers to receiving medicine and he is good on filling his Rx.     Drue Flirt, RN Transplant Nurse Coordinator 10/24/2019 10:53 AM

## 2019-10-21 LAB — CMV COMMENT: Lab: 0

## 2019-10-21 LAB — COMPREHENSIVE METABOLIC PANEL
ALKALINE PHOSPHATASE: 75 U/L
ALT (SGPT): 16 U/L
AST (SGOT): 14 U/L
BILIRUBIN TOTAL: 0.5 mg/dL
BLOOD UREA NITROGEN: 17 mg/dL
CALCIUM: 9.5 mg/dL
CHLORIDE: 101 mmol/L
CO2: 26 mmol/L
GLUCOSE RANDOM: 211 mg/dL — ABNORMAL HIGH
POTASSIUM: 3.8 mmol/L
PROTEIN TOTAL: 6.5 g/dL
SODIUM: 138 mmol/L

## 2019-10-21 LAB — CBC W/ DIFFERENTIAL
BASOPHILS ABSOLUTE COUNT: 0 10*9/L
BASOPHILS RELATIVE PERCENT: 1 %
EOSINOPHILS ABSOLUTE COUNT: 0.3 10*9/L
HEMATOCRIT: 41 %
HEMOGLOBIN: 13.7 g/dL
LYMPHOCYTES ABSOLUTE COUNT: 1.9 10*9/L
MEAN CORPUSCULAR HEMOGLOBIN CONC: 34 g/dL
MEAN CORPUSCULAR HEMOGLOBIN: 29 pg
MEAN PLATELET VOLUME: 8.1 fL
MONOCYTES ABSOLUTE COUNT: 0.5 10*9/L
MONOCYTES RELATIVE PERCENT: 9 %
NEUTROPHILS ABSOLUTE COUNT: 2.4 10*9/L
NEUTROPHILS RELATIVE PERCENT: 47 %
NUCLEATED RED BLOOD CELLS: 0 /100{WBCs}
PLATELET COUNT: 219 10*9/L
RED BLOOD CELL COUNT: 4.79 10*12/L
RED CELL DISTRIBUTION WIDTH: 14.2 %
WHITE BLOOD CELL COUNT: 5.1 10*9/L

## 2019-10-21 LAB — EGFR CKD-EPI NON-AA FEMALE: Lab: 0

## 2019-10-21 LAB — WHITE BLOOD CELL COUNT: Lab: 5.1

## 2019-10-21 LAB — MAGNESIUM: Lab: 1.1 — ABNORMAL LOW

## 2019-10-21 LAB — TACROLIMUS LEVEL, TROUGH: TACROLIMUS, TROUGH: 6.2 ng/mL (ref 5.0–15.0)

## 2019-10-21 LAB — ALBUMIN: Lab: 4.1

## 2019-10-21 LAB — PHOSPHORUS: Lab: 3.4

## 2019-10-21 LAB — TACROLIMUS, TROUGH: Lab: 6.2

## 2019-10-21 LAB — CMV DNA, QUANTITATIVE, PCR: CMV QUANT: <50 [IU]/mL — ABNORMAL HIGH (ref <0)

## 2019-10-31 NOTE — Unmapped (Signed)
[facility] Refill Coordination Note  Medication: Envarsus, Myfortic    Unable to reach patient to schedule shipment for medication being filled at [facility]. Left voicemail on phone.  As this is the 3rd unsuccessful attempt to reach the patient, no additional phone call attempts will be made at this time.      Phone numbers attempted: [phone]  Last scheduled delivery: 09/30/19    Please call the [facility] at [phone] (option 4) should you have any further questions.      Thanks,  [facility] Specialty Team

## 2019-11-02 NOTE — Unmapped (Signed)
Spoke with patient and he did need refills but he was not at home. Asked for a call back tomorrow 11/03/19.  Will reach out again after 1pm per his request.

## 2019-11-10 NOTE — Unmapped (Signed)
[facility] Refill Coordination Note    Specialty Medication(s) to be Shipped:   Transplant: Envarsus XR 1mg , Envarsus XR4mg  and Myfortic 180mg     Other medication(s) to be shipped:   Acetaminophen 500mg   Amlodipine 5mg   Magnesium 133mg   Sodium Bicarbonate 650mg   Famotidine ( Pt declined)     Russell Wade, DOB: [birth year]  Phone: [phone] (home)       All above HIPAA information was verified with patient.     Was a Nurse, learning disability used for this call? No    Completed refill call assessment today to schedule patient's medication shipment from the [facility] [phone]).       Specialty medication(s) and dose(s) confirmed: Regimen is correct and unchanged.   Changes to medications: Hilman reports no changes at this time.  Changes to insurance: No  Questions for the pharmacist: No    Confirmed patient received Welcome Packet with first shipment. The patient will receive a drug information handout for each medication shipped and additional FDA Medication Guides as required.       DISEASE/MEDICATION-SPECIFIC INFORMATION        N/A    SPECIALTY MEDICATION ADHERENCE     Medication Adherence    Patient reported X missed doses in the last month: 0  Specialty Medication: Envarsus XR 1mg   Patient is on additional specialty medications: Yes  Additional Specialty Medications: Envarsus XR 1mg   Patient Reported Additional Medication X Missed Doses in the Last Month: 0  Patient is on more than two specialty medications: Yes  Specialty Medication: Myfortic 180mg   Patient Reported Additional Medication X Missed Doses in the Last Month: 0                Envarsus XR1 mg: 5 days of medicine on hand   Envarsus XR 4  mg: 5 days of medicine on hand   Myfortic 180 mg: 5 days of medicine on hand         SHIPPING     Shipping address confirmed in Epic.     Delivery Scheduled: Yes, Expected medication delivery date: 11/15/19.     Medication will be delivered via UPS to the prescription address in Epic WAM.    Nancy Nordmann [facility] Specialty Technician

## 2019-11-11 MED FILL — AMLODIPINE 5 MG TABLET: ORAL | 30 days supply | Qty: 60 | Fill #7

## 2019-11-11 MED FILL — ACETAMINOPHEN 500 MG TABLET: 13 days supply | Qty: 100 | Fill #6 | Status: AC

## 2019-11-11 MED FILL — MYFORTIC 180 MG TABLET,DELAYED RELEASE: 30 days supply | Qty: 120 | Fill #6 | Status: AC

## 2019-11-11 MED FILL — ENVARSUS XR 1 MG TABLET,EXTENDED RELEASE: ORAL | 30 days supply | Qty: 60 | Fill #1

## 2019-11-11 MED FILL — ENVARSUS XR 4 MG TABLET,EXTENDED RELEASE: ORAL | 30 days supply | Qty: 120 | Fill #1

## 2019-11-11 MED FILL — SODIUM BICARBONATE 650 MG TABLET: 30 days supply | Qty: 120 | Fill #6 | Status: AC

## 2019-11-11 MED FILL — MG-PLUS-PROTEIN 133 MG TABLET: 30 days supply | Qty: 30 | Fill #4 | Status: AC

## 2019-11-11 MED FILL — MYFORTIC 180 MG TABLET,DELAYED RELEASE: ORAL | 30 days supply | Qty: 120 | Fill #6

## 2019-11-11 MED FILL — AMLODIPINE 5 MG TABLET: 30 days supply | Qty: 60 | Fill #7 | Status: AC

## 2019-11-11 MED FILL — SODIUM BICARBONATE 650 MG TABLET: ORAL | 30 days supply | Qty: 120 | Fill #6

## 2019-11-11 MED FILL — ENVARSUS XR 4 MG TABLET,EXTENDED RELEASE: 30 days supply | Qty: 120 | Fill #1 | Status: AC

## 2019-11-11 MED FILL — MG-PLUS-PROTEIN 133 MG TABLET: ORAL | 30 days supply | Qty: 30 | Fill #4

## 2019-11-11 MED FILL — ACETAMINOPHEN 500 MG TABLET: ORAL | 13 days supply | Qty: 100 | Fill #6

## 2019-11-11 MED FILL — ENVARSUS XR 1 MG TABLET,EXTENDED RELEASE: 30 days supply | Qty: 60 | Fill #1 | Status: AC

## 2019-11-16 DIAGNOSIS — Z940 Kidney transplant status: Principal | ICD-10-CM

## 2019-11-17 ENCOUNTER — Ambulatory Visit: Admit: 2019-11-17 | Discharge: 2019-11-18 | Payer: MEDICARE

## 2019-11-17 ENCOUNTER — Ambulatory Visit: Admit: 2019-11-17 | Discharge: 2019-11-18 | Payer: MEDICARE | Attending: Nephrology | Primary: Nephrology

## 2019-11-17 DIAGNOSIS — Z940 Kidney transplant status: Principal | ICD-10-CM

## 2019-11-17 DIAGNOSIS — Z79899 Other long term (current) drug therapy: Principal | ICD-10-CM

## 2019-11-17 DIAGNOSIS — I10 Essential (primary) hypertension: Secondary | ICD-10-CM | POA: Diagnosis not present

## 2019-11-17 DIAGNOSIS — D849 Immunodeficiency, unspecified: Secondary | ICD-10-CM | POA: Diagnosis not present

## 2019-11-17 DIAGNOSIS — Q8781 Alport syndrome: Secondary | ICD-10-CM | POA: Diagnosis not present

## 2019-11-17 DIAGNOSIS — E875 Hyperkalemia: Secondary | ICD-10-CM | POA: Diagnosis not present

## 2019-11-17 LAB — URINALYSIS
BACTERIA: NONE SEEN /HPF
BILIRUBIN UA: NEGATIVE
BLOOD UA: NEGATIVE
GLUCOSE UA: NEGATIVE
KETONES UA: NEGATIVE
NITRITE UA: NEGATIVE
PH UA: 6.5 (ref 5.0–9.0)
RBC UA: <1 /HPF (ref <3)
SPECIFIC GRAVITY UA: 1.015 (ref 1.005–1.030)
SQUAMOUS EPITHELIAL: 1 /HPF (ref 0–5)
UROBILINOGEN UA: 0.2
WBC UA: 2 /HPF — ABNORMAL HIGH (ref <2)

## 2019-11-17 LAB — CBC W/ AUTO DIFF
BASOPHILS ABSOLUTE COUNT: 0 10*9/L (ref 0.0–0.1)
BASOPHILS RELATIVE PERCENT: 1.1 %
EOSINOPHILS RELATIVE PERCENT: 7.2 %
HEMATOCRIT: 36.2 % — ABNORMAL LOW (ref 38.0–50.0)
HEMOGLOBIN: 11.9 g/dL — ABNORMAL LOW (ref 13.5–17.5)
LYMPHOCYTES ABSOLUTE COUNT: 0.5 10*9/L — ABNORMAL LOW (ref 0.7–4.0)
MEAN CORPUSCULAR HEMOGLOBIN CONC: 32.8 g/dL (ref 30.0–36.0)
MEAN CORPUSCULAR HEMOGLOBIN: 26.8 pg (ref 26.0–34.0)
MEAN CORPUSCULAR VOLUME: 81.7 fL (ref 81.0–95.0)
MEAN PLATELET VOLUME: 9 fL (ref 7.0–10.0)
MONOCYTES ABSOLUTE COUNT: 0.5 10*9/L (ref 0.1–1.0)
MONOCYTES RELATIVE PERCENT: 14.9 %
NEUTROPHILS ABSOLUTE COUNT: 2.1 10*9/L (ref 1.7–7.7)
NUCLEATED RED BLOOD CELLS: 0 /100{WBCs} (ref <=4)
PLATELET COUNT: 197 10*9/L (ref 150–450)
RED BLOOD CELL COUNT: 4.43 10*12/L (ref 4.32–5.72)
RED CELL DISTRIBUTION WIDTH: 16 % — ABNORMAL HIGH (ref 12.0–15.0)
WBC ADJUSTED: 3.3 10*9/L — ABNORMAL LOW (ref 3.5–10.5)

## 2019-11-17 LAB — BASIC METABOLIC PANEL
ANION GAP: 8 mmol/L (ref 3–11)
BLOOD UREA NITROGEN: 27 mg/dL — ABNORMAL HIGH (ref 9–23)
BUN / CREAT RATIO: 16
CHLORIDE: 102 mmol/L (ref 98–107)
CO2: 24.6 mmol/L (ref 20.0–31.0)
EGFR CKD-EPI AA MALE: 58 mL/min/{1.73_m2}
EGFR CKD-EPI NON-AA MALE: 50 mL/min/{1.73_m2}
GLUCOSE RANDOM: 89 mg/dL (ref 70–179)
POTASSIUM: 4.1 mmol/L (ref 3.5–5.1)
SODIUM: 135 mmol/L (ref 135–145)

## 2019-11-17 LAB — LIPID PANEL
CHOLESTEROL/HDL RATIO SCREEN: 8.4 — ABNORMAL HIGH (ref 1.0–4.5)
CHOLESTEROL: 243 mg/dL — ABNORMAL HIGH (ref <=200)
HDL CHOLESTEROL: 29 mg/dL — ABNORMAL LOW (ref 40–60)
TRIGLYCERIDES: 415 mg/dL — ABNORMAL HIGH (ref 0–150)

## 2019-11-17 LAB — PROTEIN URINE: Protein:MCnc:Pt:Urine:Qn:: 13

## 2019-11-17 LAB — PHOSPHORUS: Phosphate:MCnc:Pt:Ser/Plas:Qn:: 4.2

## 2019-11-17 LAB — MAGNESIUM: Magnesium:MCnc:Pt:Ser/Plas:Qn:: 2.5

## 2019-11-17 LAB — NUCLEATED RED BLOOD CELLS: Lab: 0

## 2019-11-17 LAB — PROTEIN / CREATININE RATIO, URINE: CREATININE, URINE: 92.7 mg/dL

## 2019-11-17 LAB — VLDL CHOLESTEROL CAL: Cholesterol.in VLDL:MCnc:Pt:Ser/Plas:Qn:Calculated: 0

## 2019-11-17 LAB — EGFR CKD-EPI AA MALE: Glomerular filtration rate/1.73 sq M.predicted.black:ArVRat:Pt:Ser/Plas/Bld:Qn:Creatinine-based formula (CKD-EPI): 58

## 2019-11-17 LAB — KETONES UA: Ketones:MCnc:Pt:Urine:Qn:Test strip: NEGATIVE

## 2019-11-17 NOTE — Unmapped (Signed)
Patient labs drawn in room prior to leaving Nephrology clinic

## 2019-11-19 LAB — CMV COMMENT: Lab: 0

## 2019-11-19 LAB — CMV DNA, QUANTITATIVE, PCR

## 2019-11-23 LAB — BK BLOOD LOG(10): Lab: 0

## 2019-11-23 LAB — BK VIRUS QUANTITATIVE PCR, BLOOD

## 2019-11-25 LAB — HLA DS POST TRANSPLANT
ANTI-DONOR DRW #1 MFI: 23 MFI
ANTI-DONOR HLA-A #1 MFI: 0 MFI
ANTI-DONOR HLA-A #2 MFI: 0 MFI
ANTI-DONOR HLA-B #1 MFI: 0 MFI
ANTI-DONOR HLA-C #1 MFI: 0 MFI
ANTI-DONOR HLA-C #2 MFI: 0 MFI
ANTI-DONOR HLA-DP AG #1 MFI: 27 MFI
ANTI-DONOR HLA-DR #1 MFI: 45 MFI
ANTI-DONOR HLA-DR #2 MFI: 41 MFI

## 2019-11-25 LAB — HLA CL2 AB COMMENT: Lab: 0

## 2019-11-25 LAB — FSAB CLASS 1 ANTIBODY SPECIFICITY: HLA CLASS 1 ANTIBODY RESULT: POSITIVE

## 2019-11-25 LAB — HLA CL1 ANTIBODY COMM: Lab: 0

## 2019-11-25 LAB — FSAB CLASS 2 ANTIBODY SPECIFICITY: HLA CL2 AB RESULT: POSITIVE

## 2019-11-25 LAB — DONOR HLA-B ANTIGEN #1

## 2019-11-29 NOTE — Unmapped (Addendum)
Transplant Nephrology Clinic Visit      History of Present Illness    Russell Wade is a 34 y.o. 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 He does not have evidence of donor specific antibodies as of 09/29/2019.    This is his second kidney transplant, his first was from a deceased donor on 12/01/2007 and failed 11/24/2013 secondary to rejection. He underwent transplant nephrectomy 03/20/2014.    This transplant has been complicated by delayed graft function, he required dialysis for low urine output and was discharged from the hospital on dialysis. He was initially on Myfortic 720 mg BID, reduced to 540 mg BID on 03/17/19 for diarrhea. His last dialysis was 03/12/19. He underwent transplant renal biopsy on 03/14/19 that showed diffuse subacute tubular injury with no evidence of rejection. His ureteral stent was removed 03/28/19. Required Kayexalate and Lokelma intermittently for hyperkalemia, Bactrim held. Envarsus levels elevated/labile as well. Required some Aranesp for anemia.     After stopping dialysis, his creatinine slowly trended down into the mid 2s in December, the started to rise again into the 3s and back into the mid 2s in January 2021. For that reason, we recommended biopsy at the end of January. He finally scheduled the biopsy for 07/29/19. A day prior to that biopsy, he had a COVID exposure and the biopsy was rescheduled for 08/08/19. Unfortunately he did not show for that biopsy due to not having a ride. At his visit on 08/25/19 he could not recall when he took his Envarsus the day prior, and we noted that there were a lot of issues getting a reliable trough. We recommended admission for biopsy the next day but he refused due to work and family obligations. He did agree to schedule a biopsy the following week. He was scheduled for biopsy 09/05/19 but again he no showed. He was seen in clinic on 10/20/19 with creatinine of 2.44, has not had labs since that visit. He presents today for follow up, this is his first visit with me.    Fortunately he feels well and has no physical complaints today. He continues to work full time in [employer]. He does have some hearing loss from his Alport's, does not have any hearing aids currently. Is amenable to a referral to Audiology.    He specifically denies fever, myalgias, upper respiratory/GI symptoms, or change in taste/smell.    Review of Systems    Otherwise on review of systems patient denies fever or chills, chest pain, SOB, PND or orthopnea, lower extremity edema. Denies N/V/abdominal pain. No dysuria, hematuria or difficulty voiding. Bowel movements normal. Denies joint pain or rash. All other systems are reviewed and are negative.    Medications    Current Outpatient Medications   Medication Sig Dispense Refill   ??? acetaminophen (TYLENOL) 500 MG tablet Take 1-2 tablets (500-1,000 mg total) by mouth every six (6) hours as needed for pain or fever (> 38C). 100 tablet 11   ??? amLODIPine (NORVASC) 5 MG tablet Take 2 tablets (10 mg total) by mouth daily. 60 tablet 11   ??? carvediloL (COREG) 6.25 MG tablet TAKE 1 TABLET (6.25 MG TOTAL) BY MOUTH TWO (2) TIMES A DAY. 180 tablet 3   ??? chlorthalidone (HYGROTON) 25 MG tablet Take 1 tablet (25 mg total) by mouth every morning. 30 tablet 11   ??? magnesium oxide-Mg AA chelate (MAGNESIUM, AMINO ACID CHELATE,) 133 mg Tab Take 1 tablet by mouth daily. 30 tablet 11   ???  mycophenolate (MYFORTIC) 180 MG EC tablet Take 2 tablets (360 mg total) by mouth Two (2) times a day. 120 tablet 11   ??? sodium bicarbonate 650 mg tablet Take 2 tablets (1,300 mg total) by mouth Two (2) times a day. 120 tablet 11   ??? tacrolimus (ENVARSUS XR) 1 mg Tb24 extended release tablet Take 2 tablets (2 mg total) by mouth daily with 4 (4 mg) tablets for a total dose of 18 mg daily 60 tablet 11   ??? tacrolimus (ENVARSUS XR) 4 mg Tb24 extended release tablet Take 4 tablets (16 mg total) by mouth daily with 2 (1mg ) tablets for a total dose of 18 mg daily 120 tablet 3   ??? zolpidem (AMBIEN) 10 mg tablet Take 10 mg by mouth nightly as needed for sleep.     ??? aspirin (ECOTRIN) 81 MG tablet HOLD until directed to start in clinic. (Patient not taking: Reported on 11/17/2019) 30 tablet 11   ??? famotidine (PEPCID) 20 MG tablet Take 1 tablet (20 mg total) by mouth daily. (Patient not taking: Reported on 11/17/2019) 30 tablet 5   ??? sodium zirconium cyclosilicate (LOKELMA) 5 gram PwPk packet Take 2 packets (10 g total) by mouth two (2) times a day as needed. (Patient not taking: Reported on 11/17/2019)       No current facility-administered medications for this visit.       Physical Exam    BP 128/80 (BP Site: R Arm, BP Position: Sitting, BP Cuff Size: Medium)  - Pulse 82  - Temp 36.2 ??C (97.2 ??F) (Temporal)  - Resp 14  - Wt 70.4 kg (155 lb 3.2 oz)  - BMI 22.92 kg/m??   General: Patient is a pleasant male in no apparent distress.  Eyes: Sclera anicteric.  ENT: Mask in place.  Neck: Supple without LAD/JVD/bruits.  Lungs: Clear to auscultation bilaterally, no wheezes/rales/rhonchi.  Cardiovascular: Regular rate and rhythm without murmurs, rubs or gallops.  Abdomen: Soft, notender/nondistended. Positive bowel sounds. No tenderness over the graft.  Extremities: Without edema, joints without evidence of synovitis.  Skin: Without rash.  Neurological: Grossly nonfocal, except for hearing loss.  Psychiatric: Mood and affect appropriate.    Laboratory Results    Results for orders placed or performed in visit on 11/17/19   Urine Culture    Specimen: Clean Catch; Urine   Result Value Ref Range    Urine Culture, Comprehensive Mixed Urogenital Flora    Protein/Creatinine Ratio, Urine   Result Value Ref Range    Creat U 92.7 mg/dL    Protein, Ur 81.1 mg/dL    Protein/Creatinine Ratio, Urine 0.140    Urinalysis   Result Value Ref Range    Color, UA Yellow     Clarity, UA Clear     Specific Gravity, UA 1.015 1.005 - 1.030    pH, UA 6.5 5.0 - 9.0    Leukocyte Esterase, UA Negative Negative    Nitrite, UA Negative Negative    Protein, UA Negative Negative    Glucose, UA Negative Negative    Ketones, UA Negative Negative    Urobilinogen, UA 0.2 mg/dL 0.2 - 2.0 mg/dL    Bilirubin, UA Negative Negative    Blood, UA Negative Negative    RBC, UA <1 <3 /HPF    WBC, UA 2 (H) <2 /HPF    Squam Epithel, UA 1 0 - 5 /HPF    Bacteria, UA None Seen None Seen /HPF   CMV DNA, quantitative, PCR   Result  Value Ref Range    CMV Viral Ld Not Detected Not Detected    CMV Quant      CMV Quant Log10      CMV Comment     Basic metabolic panel   Result Value Ref Range    Sodium 135 135 - 145 mmol/L    Potassium 4.1 3.5 - 5.1 mmol/L    Chloride 102 98 - 107 mmol/L    CO2 24.6 20.0 - 31.0 mmol/L    Anion Gap 8 3 - 11 mmol/L    BUN 27 (H) 9 - 23 mg/dL    Creatinine 1.61 (H) 0.60 - 1.10 mg/dL    BUN/Creatinine Ratio 16     EGFR CKD-EPI Non-African American, Male 50 mL/min/1.50m2    EGFR CKD-EPI African American, Male 83 mL/min/1.19m2    Glucose 89 70 - 179 mg/dL    Calcium 9.8 8.7 - 09.6 mg/dL   Magnesium Level   Result Value Ref Range    Magnesium 2.5 1.6 - 2.6 mg/dL   Phosphorus Level   Result Value Ref Range    Phosphorus 4.2 2.4 - 5.1 mg/dL   Cytology - Urine   Result Value Ref Range    Final Diagnosis       A. Urine, voided   - No malignant cells identified   - No decoy cells identified   - Degenerated specimen  - Acute inflammation      This electronic signature is attestation that the pathologist personally reviewed the submitted material(s) and the final diagnosis reflects that evaluation.      Clinical History       S/p kidney transplant.       Gross Description            Urine clean catch voided decoy    Rec'd 60 mls of cloudy yellow fluid - unfixed    Materials Prepared & Examined    Smear Slides.......0  Monolayers..........1  Cytospins.............0  Cell Blocks...........0  Core Biopsy.........0  Touch Prep..........0         Microscopic Description       Microscopic examination substantiates the above diagnosis.    Resident Physician: None Assigned      EMBEDDED IMAGES      Specimen Adequacy Satisfactory for evaluation     Disclaimer       Unless otherwise specified, specimens are preserved using 10% neutral buffered formalin. For cases in which immunohistochemical and/or in-situ hybridization stains are performed, the following statement applies: Appropriate controls for each stain (positive controls with or without negative controls) have been evaluated and stain as expected. These stains have not been separately validated for use on decalcified specimens and should be interpreted with caution in that setting. Some of the reagents used for these stains may be classified as analyte specific reagents (ASR). Tests using ASRs were developed, and their performance characteristics were determined, by the Anatomic Pathology Department [facility]). They have not been cleared or approved by the [place] Food and Drug Administration (FDA). The FDA does not require these tests to go through premarket FDA review. These tests are used for clinical purposes. They should not be regarded as investigational or for research. This laboratory is certified under the Clinical Laboratory Improvement Amendments (CLIA) as qualified to perform high complexity clinical laboratory testing.     CBC w/ Differential   Result Value Ref Range    WBC 3.3 (L) 3.5 - 10.5 10*9/L    RBC 4.43 4.32 - 5.72  10*12/L    HGB 11.9 (L) 13.5 - 17.5 g/dL    HCT 16.1 (L) 09.6 - 50.0 %    MCV 81.7 81.0 - 95.0 fL    MCH 26.8 26.0 - 34.0 pg    MCHC 32.8 30.0 - 36.0 g/dL    RDW 04.5 (H) 40.9 - 15.0 %    MPV 9.0 7.0 - 10.0 fL    Platelet 197 150 - 450 10*9/L    nRBC 0 <=4 /100 WBCs    Neutrophils % 63.0 %    Lymphocytes % 13.8 %    Monocytes % 14.9 %    Eosinophils % 7.2 %    Basophils % 1.1 %    Absolute Neutrophils 2.1 1.7 - 7.7 10*9/L    Absolute Lymphocytes 0.5 (L) 0.7 - 4.0 10*9/L    Absolute Monocytes 0.5 0.1 - 1.0 10*9/L    Absolute Eosinophils 0.2 0.0 - 0.7 10*9/L    Absolute Basophils 0.0 0.0 - 0.1 10*9/L     *Note: Due to a large number of results and/or encounters for the requested time period, some results have not been displayed. A complete set of results can be found in Results Review.       Assessment and Plan    1. Status post second renal transplant. Fortunately his creatinine has come down to 1.74 today, which is the lowest it has been since transplant. I am comfortable holding off on biopsy for now, but have a low threshold to biopsy should his creatinine start trending in the wrong direction. His tacrolimus level was not drawn today as he took his meds this morning. Unfortunately we do not have a tacrolimus level since 10/20/19, which was not at goal. Given his high cPRA his goal is 8-9. We spent the bulk of the visit discussing that we need labs and accurate trough levels to help him have success with this transplant. He is quite sincere in wanting to do better with this transplant than his first one, but his actions are not backing that up. Will continue current immunosuppression for now.    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 I think he would benefit from hearing aides. Will refer to Audiology.    3. Hypertension. Blood pressure well controlled current regimen.    4. Hyperkalemia. Potassium normal at 4.1, has not required [facility] recently.    5. Hypomagnesemia. Mg 2.5 today on current supplementation of one tablet daily. If remains normal could probably hold.    6. Metabolic acidosis. CO2 at goal on current supplementation.    7. Hyperlipidemia. Triglycerides quite elevated on lipid panel today, had been normal before. Will monitor and treat if remain elevated.     8. Health maintenance. Had flu shot in the fall, has not received pneumococcal vaccines or COVID series.    We discussed that while we continue to recommend the COVID vaccine for all eligible patients, we know that kidney transplant patients do not respond as strongly due to their immunosuppression, so he should continue to follow the CDC guidelines for patients who are not vaccinated. I would also encourage all household members and close contacts that are eligible for vaccination to get vaccinated. We discussed the importance of ongoing social distancing, wearing a mask outside the home, and hand washing/sanitizing.     9. Will see patient back in 4 weeks, or sooner if needed. Reinforced importance of regular labs.

## 2019-12-05 DIAGNOSIS — Q8781 Alport syndrome: Principal | ICD-10-CM

## 2019-12-05 DIAGNOSIS — H9190 Unspecified hearing loss, unspecified ear: Principal | ICD-10-CM

## 2019-12-06 NOTE — Unmapped (Signed)
Pt advised to get labs weekly with a real envarsus trough. Agreed to get labs Wednesday 12/07/19.

## 2019-12-08 DIAGNOSIS — Z940 Kidney transplant status: Principal | ICD-10-CM

## 2019-12-14 NOTE — Unmapped (Signed)
Pt advised to get labs tomorrow. Did not get labs as previously requested.Marland Kitchen

## 2019-12-20 DIAGNOSIS — Z940 Kidney transplant status: Principal | ICD-10-CM

## 2019-12-20 MED ORDER — FAMOTIDINE 20 MG TABLET
ORAL_TABLET | Freq: Every day | ORAL | 5 refills | 30.00000 days | Status: CP
Start: 2019-12-20 — End: 2020-12-19
  Filled 2019-12-22: qty 30, 30d supply, fill #0

## 2019-12-20 NOTE — Unmapped (Signed)
[facility] Refill Coordination Note    Specialty Medication(s) to be Shipped:   Transplant: Envarsus 1mg , Envarsus 4mg  and Myfortic 180mg     Other medication(s) to be shipped:   Acetaminophen 500mg   Amlodipine 5mg   Magnesium 133mg   Sodium Bicarbonate 650mg   Famotidine        Russell Wade, DOB: 09-Nov-1985  Phone: [phone] (home)       All above HIPAA information was verified with patient.     Was a Nurse, learning disability used for this call? No    Completed refill call assessment today to schedule patient's medication shipment from the [facility] [phone]).       Specialty medication(s) and dose(s) confirmed: Regimen is correct and unchanged.   Changes to medications: Tison reports no changes at this time.  Changes to insurance: No  Questions for the pharmacist: No    Confirmed patient received Welcome Packet with first shipment. The patient will receive a drug information handout for each medication shipped and additional FDA Medication Guides as required.       DISEASE/MEDICATION-SPECIFIC INFORMATION        N/A    SPECIALTY MEDICATION ADHERENCE     Medication Adherence    Patient reported X missed doses in the last month: 0      ??  Envarsus XR1 mg: 8 days of medicine on hand   Envarsus XR 4  mg: 8 days of medicine on hand   Myfortic 180 mg: 8 days of medicine on hand   ??        SHIPPING     Shipping address confirmed in Epic.     Delivery Scheduled: Yes, Expected medication delivery date: 12/23/19.     Medication will be delivered via UPS to the prescription address in Epic WAM.    Russell Wade   [facility] Specialty Technician

## 2019-12-20 NOTE — Unmapped (Unsigned)
Transplant Nephrology Clinic Visit      History of Present Illness    Russell Wade is a 34 y.o. male who underwent deceased donor transplant on 08-Mar-2019 secondary to Alport's syndrome. He does not have evidence of donor specific antibodies as of 09/29/2019.    This is his second kidney transplant, his first was from a deceased donor on 28-Nov-2007 and failed 11/24/2013 secondary to rejection. He underwent transplant nephrectomy 03/20/2014.    This transplant has been complicated by delayed graft function, he required dialysis for low urine output and was discharged from the hospital on dialysis. He was initially on Myfortic 720 mg BID, reduced to 540 mg BID on 03/17/19 for diarrhea. His last dialysis was 03/12/19. He underwent transplant renal biopsy on 03/14/19 that showed diffuse subacute tubular injury with no evidence of rejection. His ureteral stent was removed 03/28/19. Required Kayexalate and Lokelma intermittently for hyperkalemia, Bactrim held. Envarsus levels elevated/labile as well. Required some Aranesp for anemia.     After stopping dialysis, his creatinine slowly trended down into the mid 2s in December, the started to rise again into the 3s and back into the mid 2s in January 2021. For that reason, we recommended biopsy at the end of January. He finally scheduled the biopsy for 07/29/19. A day prior to that biopsy, he had a COVID exposure and the biopsy was rescheduled for 08/08/19. Unfortunately he did not show for that biopsy due to not having a ride. At his visit on 08/25/19 he could not recall when he took his Envarsus the day prior, and we noted that there were a lot of issues getting a reliable trough. We recommended admission for biopsy the next day but he refused due to work and family obligations. He did agree to schedule a biopsy the following week. He was scheduled for biopsy 09/05/19 but again he no showed. He was seen in clinic on 10/20/19 with creatinine of 2.44. He was asked to repeat labs but did not.    Interval History since last visit 11/17/2019:    At his last visit his creatinine was improved to 1.74, so we held off on biopsy. His coordinator called him on 6/21 and 12/14/19 to ask him to get labs but he did not do so.    He presents today for follow up.    He continues to work full time in [employer]. He does have some hearing loss from his Alport's, does not have any hearing aids currently. Is amenable to a referral to Audiology.    He specifically denies fever, myalgias, upper respiratory/GI symptoms, or change in taste/smell.    Review of Systems    Otherwise on review of systems patient denies fever or chills, chest pain, SOB, PND or orthopnea, lower extremity edema. Denies N/V/abdominal pain. No dysuria, hematuria or difficulty voiding. Bowel movements normal. Denies joint pain or rash. All other systems are reviewed and are negative.    Medications    Current Outpatient Medications   Medication Sig Dispense Refill   ??? acetaminophen (TYLENOL) 500 MG tablet Take 1-2 tablets (500-1,000 mg total) by mouth every six (6) hours as needed for pain or fever (> 38C). 100 tablet 11   ??? amLODIPine (NORVASC) 5 MG tablet Take 2 tablets (10 mg total) by mouth daily. 60 tablet 11   ??? aspirin (ECOTRIN) 81 MG tablet HOLD until directed to start in clinic. (Patient not taking: Reported on 11/17/2019) 30 tablet 11   ??? carvediloL (COREG) 6.25 MG tablet  TAKE 1 TABLET (6.25 MG TOTAL) BY MOUTH TWO (2) TIMES A DAY. 180 tablet 3   ??? chlorthalidone (HYGROTON) 25 MG tablet Take 1 tablet (25 mg total) by mouth every morning. 30 tablet 11   ??? famotidine (PEPCID) 20 MG tablet Take 1 tablet (20 mg total) by mouth daily. (Patient not taking: Reported on 11/17/2019) 30 tablet 5   ??? magnesium oxide-Mg AA chelate (MAGNESIUM, AMINO ACID CHELATE,) 133 mg Tab Take 1 tablet by mouth daily. 30 tablet 11   ??? mycophenolate (MYFORTIC) 180 MG EC tablet Take 2 tablets (360 mg total) by mouth Two (2) times a day. 120 tablet 11   ??? sodium bicarbonate 650 mg tablet Take 2 tablets (1,300 mg total) by mouth Two (2) times a day. 120 tablet 11   ??? sodium zirconium cyclosilicate (LOKELMA) 5 gram PwPk packet Take 2 packets (10 g total) by mouth two (2) times a day as needed. (Patient not taking: Reported on 11/17/2019)     ??? tacrolimus (ENVARSUS XR) 1 mg Tb24 extended release tablet Take 2 tablets (2 mg total) by mouth daily with 4 (4 mg) tablets for a total dose of 18 mg daily 60 tablet 11   ??? tacrolimus (ENVARSUS XR) 4 mg Tb24 extended release tablet Take 4 tablets (16 mg total) by mouth daily with 2 (1mg ) tablets for a total dose of 18 mg daily 120 tablet 3   ??? zolpidem (AMBIEN) 10 mg tablet Take 10 mg by mouth nightly as needed for sleep.       No current facility-administered medications for this visit.       Physical Exam    There were no vitals taken for this visit.  General: Patient is a pleasant male in no apparent distress.  Eyes: Sclera anicteric.  ENT: Mask in place.  Neck: Supple without LAD/JVD/bruits.  Lungs: Clear to auscultation bilaterally, no wheezes/rales/rhonchi.  Cardiovascular: Regular rate and rhythm without murmurs, rubs or gallops.  Abdomen: Soft, notender/nondistended. Positive bowel sounds. No tenderness over the graft.  Extremities: Without edema, joints without evidence of synovitis.  Skin: Without rash.  Neurological: Grossly nonfocal, except for hearing loss.  Psychiatric: Mood and affect appropriate.    Laboratory Results    Results for orders placed or performed in visit on 11/17/19   BK Virus Quantitative PCR, Blood   Result Value Ref Range    BK Blood Result Not Detected Not Detected    BK Blood Quant      BK Blood Log(10)      Bk Blood Comment     Lipid Panel   Result Value Ref Range    Triglycerides 415 (H) 0 - 150 mg/dL    Cholesterol 161 (H) <=200 mg/dL    HDL 29 (L) 40 - 60 mg/dL    LDL Calculated      VLDL Cholesterol Cal      Chol/HDL Ratio 8.4 (H) 1.0 - 4.5    Non-HDL Cholesterol 214 (H) 70 - 130 mg/dL    FASTING Unknown    HLA DSA Post Transplant   Result Value Ref Range    Donor ID [id number]     Donor HLA-A Antigen #1 A2     Anti-Donor HLA-A #1 MFI 0 <1000 MFI    Donor HLA-A Antigen #2 A68     Anti-Donor HLA-A #2 MFI 0 <1000 MFI    Donor HLA-B Antigen #1 B27     Anti-Donor HLA-B #1 MFI 0 <1000 MFI    Donor  HLA-B Antigen #2 B45     Anti-Donor HLA-B #2 MFI 0 <1000 MFI    Donor HLA-C Antigen #1 C2     Anti-Donor HLA-C #1 MFI 0 <1000 MFI    Donor HLA-C Antigen #2 C16     Anti-Donor HLA-C #2 MFI 0 <1000 MFI    Donor HLA-DR Antigen #1 DR13     Anti-Donor HLA-DR #1 MFI 45 <1000 MFI    Donor HLA-DR Antigen #2 DR14     Anti-Donor HLA-DR #2 MFI 41 <1000 MFI    Donor DRw Antigen #1 DR52     Anti-Donor DRw #1 MFI 23 <1000 MFI    Donor DRw Antigen #2 DR52     Anti-Donor DRw #2 MFI 23 <1000 MFI    Donor HLA-DQB Antigen #1 DQ5     Anti-Donor HLA-DQB #1 MFI 334 <1000 MFI    Donor HLA-DP Antigen #1 DPB04:02     Anti-Donor HLA-DP Ag #1 MFI 27 <1000 MFI    DSA Comment     FSAB Class 1 Antibody Specificity   Result Value Ref Range    HLA Class 1 Antibody Result Positive     HLA Class 1 Antibodies Identified A:3, 11, 30, 31, 32, 36  B:51  Cw:5, 15     HLA Class 1 Antibody Comment     FSAB Class 2 Antibody Specificity   Result Value Ref Range    HLA Class 2 Antibody Result Positive     HLA Class 2 Antibodies Identified       DR:1, 103, 9, 10, 15, 16  DRw:51  DPB1:1, 3, 5, 6, 9, 10, 11, 13, 14, 17, 19, 20    HLA Class 2 Antibody Comment       *Note: Due to a large number of results and/or encounters for the requested time period, some results have not been displayed. A complete set of results can be found in Results Review.       Assessment and Plan    1. Status post second renal transplant. Fortunately his creatinine has come down to 1.74 today, which is the lowest it has been since transplant. I am comfortable holding off on biopsy for now, but have a low threshold to biopsy should his creatinine start trending in the wrong direction. His tacrolimus level was not drawn today as he took his meds this morning. Unfortunately we do not have a tacrolimus level since 10/20/19, which was not at goal. Given his high cPRA his goal is 8-9. We spent the bulk of the visit discussing that we need labs and accurate trough levels to help him have success with this transplant. He is quite sincere in wanting to do better with this transplant than his first one, but his actions are not backing that up. Will continue current immunosuppression for now.    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 I think he would benefit from hearing aides. He is scheduled to see Audiology on 01/10/20.    3. Hypertension. Blood pressure well controlled current regimen.    4. Hyperkalemia. Potassium normal at 4.1, has not required [facility] recently.    5. Hypomagnesemia. Mg 2.5 today on current supplementation of one tablet daily. If remains normal could probably hold.    6. Metabolic acidosis. CO2 at goal on current supplementation.    7. Hyperlipidemia. Triglycerides quite elevated on lipid panel today, had been normal before. Will monitor and treat if remain elevated.     8. Health maintenance. Had flu  shot in the fall, has not received pneumococcal vaccines or COVID series.    We discussed that while we continue to recommend the COVID vaccine for all eligible patients, we know that kidney transplant patients do not respond as strongly due to their immunosuppression, so he should continue to follow the CDC guidelines for patients who are not vaccinated. I would also encourage all household members and close contacts that are eligible for vaccination to get vaccinated. We discussed the importance of ongoing social distancing, wearing a mask outside the home, and hand washing/sanitizing.     9. Will see patient back in 4 weeks, or sooner if needed. Reinforced importance of regular labs.

## 2019-12-20 NOTE — Unmapped (Unsigned)
[facility] PHARMACY NOTE  12/20/2019   Russell Wade  [id number]    Medication changes today:   1.     Education/Adherence tools provided today:  1.provided updated medication list  2. provided additional education on immunosuppression and transplant related medications including reviewing indications of medications, dosing and side effects  3.  provided additional education on OTC meds/ supplements    Follow up items:  1. goal of understanding indications and dosing of immunosuppression medications  2. Envarsus level  3. GI tolerance with lower dose of Myfortic  4. CMV PCR    Next visit with pharmacy in PRN  ____________________________________________________________________    Russell Wade is a 34 y.o. 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    Other PMH significant for parathyroidectomy with autotransplant    Post op course complicated by: DGF requiring maintenance on HD until 9/28.    Seen by pharmacy today for: medication management and pill box fill and adherence education; last seen by pharmacy 8 months ago     CC:  Patient complains of diarrhea, SOB, and trouble sleeping     There were no vitals filed for this visit.    Allergies   Allergen Reactions   ??? Tuberculin Ppd      Other reaction(s): Skin Rash       All medications reviewed and updated.     Medication list includes revisions made during today???s encounter    Outpatient Encounter Medications as of 12/21/2019   Medication Sig Dispense Refill   ??? acetaminophen (TYLENOL) 500 MG tablet Take 1-2 tablets (500-1,000 mg total) by mouth every six (6) hours as needed for pain or fever (> 38C). 100 tablet 11   ??? amLODIPine (NORVASC) 5 MG tablet Take 2 tablets (10 mg total) by mouth daily. 60 tablet 11   ??? aspirin (ECOTRIN) 81 MG tablet HOLD until directed to start in clinic. (Patient not taking: Reported on 11/17/2019) 30 tablet 11   ??? carvediloL (COREG) 6.25 MG tablet TAKE 1 TABLET (6.25 MG TOTAL) BY MOUTH TWO (2) TIMES A DAY. 180 tablet 3   ??? chlorthalidone (HYGROTON) 25 MG tablet Take 1 tablet (25 mg total) by mouth every morning. 30 tablet 11   ??? famotidine (PEPCID) 20 MG tablet Take 1 tablet (20 mg total) by mouth daily. (Patient not taking: Reported on 11/17/2019) 30 tablet 5   ??? magnesium oxide-Mg AA chelate (MAGNESIUM, AMINO ACID CHELATE,) 133 mg Tab Take 1 tablet by mouth daily. 30 tablet 11   ??? mycophenolate (MYFORTIC) 180 MG EC tablet Take 2 tablets (360 mg total) by mouth Two (2) times a day. 120 tablet 11   ??? sodium bicarbonate 650 mg tablet Take 2 tablets (1,300 mg total) by mouth Two (2) times a day. 120 tablet 11   ??? sodium zirconium cyclosilicate (LOKELMA) 5 gram PwPk packet Take 2 packets (10 g total) by mouth two (2) times a day as needed. (Patient not taking: Reported on 11/17/2019)     ??? tacrolimus (ENVARSUS XR) 1 mg Tb24 extended release tablet Take 2 tablets (2 mg total) by mouth daily with 4 (4 mg) tablets for a total dose of 18 mg daily 60 tablet 11   ??? tacrolimus (ENVARSUS XR) 4 mg Tb24 extended release tablet Take 4 tablets (16 mg total) by mouth daily with 2 (1mg ) tablets for a total dose of 18  mg daily 120 tablet 3   ??? zolpidem (AMBIEN) 10 mg tablet Take 10 mg by mouth nightly as needed for sleep.     ??? [DISCONTINUED] sodium polystyrene sulfonate (SODIUM POLYSTYRENE) 15 gram/60 mL Susp Take 120 mL (30 g total) by mouth daily for 2 days. 240 mL 1     No facility-administered encounter medications on file as of 12/21/2019.       Induction agent : alemtuzumab    CURRENT IMMUNOSUPPRESSION: tacrolimus 21 mg PO qd  prograf/Envarsus/cyclosporine goal: 6-8   myfortic360  mg PO bid (lower dose 2/2 diarrhea)   steroid free     Patient complains of diarrhea    IMMUNOSUPPRESSION DRUG LEVELS:  Lab Results   Component Value Date    Tacrolimus, Trough 6.2 10/20/2019    Tacrolimus, Trough 5.0 09/29/2019    Tacrolimus, Trough 6.4 08/25/2019    Tacrolimus, Trough 10.0 06/03/2019    Tacrolimus, Trough <0.5 03/17/2014    Tacrolimus, Trough 7.7 04/05/2013     No results found for: CYCLO  No results found for: EVEROLIMUS  No results found for: SIROLIMUS    Envarsus level is accurate 24 hour trough    Graft function: complicated by DGF  DSA: ntd  Biopsies to date: zero hour biopsy negative for diagnostic abnormalities  WBC/ANC:  wnl    Plan: Will *** Educated patient to take Envarsus on an empty stomach    OI Prophylaxis:   CMV Status: D+/ R+, moderate risk . CMV prophylaxis: valganciclovir 450 mg two days per week (Mon/Thur) x 3 months per protocol compelte  Lab Results   Component Value Date    CMV Quant <50 (H) 10/20/2019    CMV Quant <50 (H) 09/29/2019    CMV Quant 213 (H) 08/25/2019    CMV Quant 1468 08/22/2008     PCP Prophylaxis: bactrim SS 1 tab MWF x 6 months complete  Thrush: completed in hospital  Patient is  tolerating infectious prophylaxis well    Plan: Continue per protocol. Continue to monitor.    CV Prophylaxis: holding asa 81 mg   The ASCVD Risk score Denman George DC Montez Hageman, et al., 2013) failed to calculate.  Statin therapy: Not indicated given age; currently on no statin  Plan: consider starting statin at a later date. Continue to monitor. Pt reports SOB and chest tightness- continue to monitor.      BP: Goal < 140/90. Clinic vitals reported above  Home BP ranges: 130-150s/90s  HR: 90s  Current meds include: amlodipine 10 mg daily, carvedilol 6.25mg  BID.  Plan: within goal, occasionally elevated. Continue to monitor    Anemia of CKD:  H/H:   Lab Results   Component Value Date    HGB 11.9 (L) 11/17/2019     Lab Results   Component Value Date    HCT 36.2 (L) 11/17/2019     Iron panel:  Lab Results   Component Value Date    IRON 187 (H) 03/25/2019    TIBC 245.3 (L) 03/25/2019    FERRITIN 2,720.0 (H) 03/25/2019     Lab Results   Component Value Date    Iron Saturation (%) 76 (H) 03/25/2019 Iron Saturation (%) 15 (L) 03/21/2014       Plan: stable. Continue to monitor.     DM:   Lab Results   Component Value Date    A1C 4.9 02/20/2019   . Goal A1c < 7  History of Dm? No  Diet:did not address  Exercise:not yet  Hypoglycemia:  no   Fluid intake: 4-5 bottles daily  Plan:  Continue to monitor    Electrolytes: Phos 3.7, K 5.5, Ca 11.4, CO2 16  Meds currently on: Phoslo 3 capsulesTID AC  Plan: Start sodium bicarb 650mg  BID.  Decrease PhosLo to 2 capsules TID - can likely DC soon if SCr continues to improve. Monitor Calcium.  Of note, he did not tolerate Sensipar pre transplant due to GI effects.  Continue to monitor     GI/BM: pt reports diarrhea and occasional heartburn  Meds currently on: loperamide (pt does not think it is effective), using pepto bismol for diarrhea  Plan: Restart famotidine 20mg  once daily. Continue to monitor, may continue using pepto for symptoms    Pain: pt reports moderate incisional site pain  Meds currently on: APAP PRN (using 1g daily), tramadol PRN, gabapentin 100 mg HS  Plan: Continue to monitor    Sleep: Pt reports difficulty sleeping  Meds currently on: zolpidem 10mg  nightly, pt taking Zzzquil for sleep  Plan: Continue to monitor    Bone health:   Vitamin D Level: 9.6 on 03/02/19. Goal > 30.   Last DEXA results:  none available  Current meds include: ergocalciferol 50,000 units weeklyy  Plan: Vitamin D level  out of goal,continue supplementation. Continue to monitor.     Women's/Men's Health:  Luan Maberry is a 34 y.o. male. Patient reports no men's/women's health issues  Plan: Continue to monitor    Itching  Meds currently on: hydroxyzine PRN (hasn't needed)  Plan: continue to monitor    Pharmacy preference:  [facility]    Adherence: Patient has average understanding of medications; was able to independently identify names/doses of immunosuppressants and OI meds.  Patient  does fill their own pill box on a regular basis at home.  Patient brought medication card:no  Pill box:did not bring  Plan: ; provided extensive adherence counseling/intervention    Spent approximately 40 minutes on educating this patient and greater than 50% was spent in direct face to face counseling regarding post transplant medication education. Questions and concerns were address to patient's satisfaction.    Patient was reviewed with Dr. Gwynneth Munson who was agreement with the stated plan:     During this visit, the following was completed:   BG log data assessment  BP log data assessment  Labs ordered and evaluated  complex treatment plan >1 DS   Patient education was completed for 11-24 minutes     All questions/concerns were addressed to the patient's satisfaction.  __________________________________________    Cleone Slim, PHARMD, CPP  [employer] TRANSPLANT CLINICAL PHARMACIST PRACTITIONER  PAGER [phone]

## 2019-12-21 NOTE — Unmapped (Signed)
Pt called to cancel appointment this morning and advised to get labs today.

## 2019-12-22 MED FILL — AMLODIPINE 5 MG TABLET: ORAL | 30 days supply | Qty: 60 | Fill #8

## 2019-12-22 MED FILL — SODIUM BICARBONATE 650 MG TABLET: ORAL | 30 days supply | Qty: 120 | Fill #7

## 2019-12-22 MED FILL — MG-PLUS-PROTEIN 133 MG TABLET: ORAL | 30 days supply | Qty: 30 | Fill #5

## 2019-12-22 MED FILL — SODIUM BICARBONATE 650 MG TABLET: 30 days supply | Qty: 120 | Fill #7 | Status: AC

## 2019-12-22 MED FILL — ACETAMINOPHEN 500 MG TABLET: ORAL | 13 days supply | Qty: 100 | Fill #7

## 2019-12-22 MED FILL — MYFORTIC 180 MG TABLET,DELAYED RELEASE: 30 days supply | Qty: 120 | Fill #7 | Status: AC

## 2019-12-22 MED FILL — ENVARSUS XR 4 MG TABLET,EXTENDED RELEASE: ORAL | 30 days supply | Qty: 120 | Fill #2

## 2019-12-22 MED FILL — MYFORTIC 180 MG TABLET,DELAYED RELEASE: ORAL | 30 days supply | Qty: 120 | Fill #7

## 2019-12-22 MED FILL — ENVARSUS XR 1 MG TABLET,EXTENDED RELEASE: 30 days supply | Qty: 60 | Fill #2 | Status: AC

## 2019-12-22 MED FILL — ACETAMINOPHEN 500 MG TABLET: 13 days supply | Qty: 100 | Fill #7 | Status: AC

## 2019-12-22 MED FILL — AMLODIPINE 5 MG TABLET: 30 days supply | Qty: 60 | Fill #8 | Status: AC

## 2019-12-22 MED FILL — ENVARSUS XR 4 MG TABLET,EXTENDED RELEASE: 30 days supply | Qty: 120 | Fill #2 | Status: AC

## 2019-12-22 MED FILL — FAMOTIDINE 20 MG TABLET: 30 days supply | Qty: 30 | Fill #0 | Status: AC

## 2019-12-22 MED FILL — ENVARSUS XR 1 MG TABLET,EXTENDED RELEASE: ORAL | 30 days supply | Qty: 60 | Fill #2

## 2019-12-22 MED FILL — MG-PLUS-PROTEIN 133 MG TABLET: 30 days supply | Qty: 30 | Fill #5 | Status: AC

## 2020-01-10 ENCOUNTER — Institutional Professional Consult (permissible substitution): Admit: 2020-01-10 | Discharge: 2020-01-11 | Payer: MEDICARE | Attending: Audiologist | Primary: Audiologist

## 2020-01-10 DIAGNOSIS — H903 Sensorineural hearing loss, bilateral: Secondary | ICD-10-CM | POA: Diagnosis not present

## 2020-01-10 DIAGNOSIS — Q8781 Alport syndrome: Secondary | ICD-10-CM | POA: Diagnosis not present

## 2020-01-10 NOTE — Unmapped (Signed)
AUDIOLOGIC EVALUATION    [PAIN 0/10]. Russell Wade presents to [facility] for an audiologic evaluation. Patient was referred for a hearing evaluation by Brayton Caves, MD.    HISTORY: Russell Wade is a 34 y.o. male with a history of bilateral sensorineural hearing loss. Patient reports his hearing has remained stable since he was last seen in the clinic, but he is interested in pursuing amplification at this time. Patient has worn several pairs of hearing aids since childhood, but reportedly has not worn amplification since 2009. 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 Patient reports occasional tinnitus. Patient denies any vertigo, prior ear surgery, recent ear infections, otalgia, or aural fullness.    RESULTS: Today's results were obtained using insert headphones with good reliability.    RIGHT ear: Mild sloping to severe sensorineural hearing loss  Speech Reception Threshold: 45 dB HL  Word Recognition Score: 84% @ 85 dB HL (using Recorded NU-6 words with masking in contralateral ear)    LEFT ear: Mild sloping to severe sensorineural hearing loss  Speech Reception Threshold: 45 dB HL  Word Recognition Score: 80% @ 85 dB HL (using Recorded NU-6 words with masking in contralateral ear)    IMPRESSIONS:  Today's results are unchanged when compared with last audiogram on 03/17/2013.     *SEE AUDIOGRAM IMAGE IN MEDIA TAB*    RECOMMENDATIONS:  ??? Return for hearing aid consult on 01/12/20  ??? Consider trial with amplification pending medical clearance and patient motivation  ??? Re-evaluate per MD or sooner with any changes or concerns regarding hearing status    Elise Benne, B.S.   Doctoral Student    I was physically present and immediately available to direct and supervise tasks that were related to patient management. The direction and supervision was continuous throughout the time these tasks were performed.    I wore appropriate PPE throughout entire appointment (face mask and eye protection). Patient also wore face mask appropriately for the entire appointment.    Procedure(s):  1) CPT 92557 - Comprehensive Audio Eval & Speech Recognition    Visit Time: 30 min    Dorothyann Gibbs, AuD  Clinical Audiologist

## 2020-01-12 ENCOUNTER — Ambulatory Visit: Admit: 2020-01-12 | Payer: MEDICARE | Attending: Audiologist | Primary: Audiologist

## 2020-01-23 MED ORDER — CARVEDILOL 6.25 MG TABLET
ORAL_TABLET | Freq: Two times a day (BID) | ORAL | 3 refills | 90.00000 days
Start: 2020-01-23 — End: 2021-01-22

## 2020-01-23 NOTE — Unmapped (Signed)
[facility] Refill Coordination Note    Specialty Medication(s) to be Shipped:   Transplant: Envarsus 1mg , Envarsus 4mg  and Myfortic 180mg     Other medication(s) to be shipped:      Carvedilol  Acetaminophen 500mg    Amlodipine 5mg    Magnesium 133mg    Sodium Bicarbonate 650mg    Famotidine       Russell Wade, DOB: [birth year]  Phone: [phone] (home)       All above HIPAA information was verified with patient.     Was a Nurse, learning disability used for this call? No    Completed refill call assessment today to schedule patient's medication shipment from the [facility] [phone]).       Specialty medication(s) and dose(s) confirmed: Regimen is correct and unchanged.   Changes to medications: Russell Wade reports no changes at this time.  Changes to insurance: No  Questions for the pharmacist: No    Confirmed patient received Welcome Packet with first shipment. The patient will receive a drug information handout for each medication shipped and additional FDA Medication Guides as required.       DISEASE/MEDICATION-SPECIFIC INFORMATION        N/A    SPECIALTY MEDICATION ADHERENCE     Medication Adherence    Patient reported X missed doses in the last month: 0        Envarsus XR1 mg: 8 days of medicine on hand   Envarsus XR 4 mg: 8 days of medicine on hand   Myfortic 180 mg: 8 days of medicine on hand               SHIPPING     Shipping address confirmed in Epic.     Delivery Scheduled: Yes, Expected medication delivery date: 01/26/20.     Medication will be delivered via UPS to the prescription address in Epic WAM.    Russell Wade   [facility] Specialty Technician

## 2020-01-24 DIAGNOSIS — Z114 Encounter for screening for human immunodeficiency virus [HIV]: Principal | ICD-10-CM

## 2020-01-24 DIAGNOSIS — Z940 Kidney transplant status: Principal | ICD-10-CM

## 2020-01-24 DIAGNOSIS — E119 Type 2 diabetes mellitus without complications: Principal | ICD-10-CM

## 2020-01-25 ENCOUNTER — Ambulatory Visit: Admit: 2020-01-25 | Discharge: 2020-01-25 | Payer: MEDICARE

## 2020-01-25 ENCOUNTER — Ambulatory Visit: Admit: 2020-01-25 | Discharge: 2020-01-26 | Payer: MEDICARE

## 2020-01-25 ENCOUNTER — Ambulatory Visit: Admit: 2020-01-25 | Discharge: 2020-01-26 | Payer: MEDICARE | Attending: Ambulatory Care | Primary: Ambulatory Care

## 2020-01-25 ENCOUNTER — Ambulatory Visit: Admit: 2020-01-25 | Discharge: 2020-01-26 | Payer: MEDICARE | Attending: Nephrology | Primary: Nephrology

## 2020-01-25 DIAGNOSIS — Z114 Encounter for screening for human immunodeficiency virus [HIV]: Principal | ICD-10-CM

## 2020-01-25 DIAGNOSIS — Z79899 Other long term (current) drug therapy: Principal | ICD-10-CM

## 2020-01-25 DIAGNOSIS — E875 Hyperkalemia: Principal | ICD-10-CM

## 2020-01-25 DIAGNOSIS — E872 Acidosis: Principal | ICD-10-CM

## 2020-01-25 DIAGNOSIS — Z940 Kidney transplant status: Principal | ICD-10-CM

## 2020-01-25 DIAGNOSIS — E119 Type 2 diabetes mellitus without complications: Principal | ICD-10-CM

## 2020-01-25 DIAGNOSIS — D849 Immunodeficiency, unspecified: Principal | ICD-10-CM

## 2020-01-25 DIAGNOSIS — I10 Essential (primary) hypertension: Principal | ICD-10-CM

## 2020-01-25 DIAGNOSIS — Q8781 Alport syndrome: Principal | ICD-10-CM

## 2020-01-25 DIAGNOSIS — Z23 Encounter for immunization: Secondary | ICD-10-CM | POA: Diagnosis not present

## 2020-01-25 DIAGNOSIS — H903 Sensorineural hearing loss, bilateral: Secondary | ICD-10-CM | POA: Diagnosis not present

## 2020-01-25 DIAGNOSIS — D649 Anemia, unspecified: Secondary | ICD-10-CM | POA: Diagnosis not present

## 2020-01-25 DIAGNOSIS — Z7982 Long term (current) use of aspirin: Secondary | ICD-10-CM | POA: Diagnosis not present

## 2020-01-25 LAB — LDL CHOLESTEROL CALCULATED: Cholesterol.in LDL:MCnc:Pt:Ser/Plas:Qn:Calculated: 96

## 2020-01-25 LAB — CBC W/ AUTO DIFF
BASOPHILS ABSOLUTE COUNT: 0 10*9/L (ref 0.0–0.1)
BASOPHILS RELATIVE PERCENT: 0.6 %
EOSINOPHILS RELATIVE PERCENT: 5 %
HEMATOCRIT: 39.7 % (ref 38.0–50.0)
HEMOGLOBIN: 12.8 g/dL — ABNORMAL LOW (ref 13.5–17.5)
LYMPHOCYTES ABSOLUTE COUNT: 0.5 10*9/L — ABNORMAL LOW (ref 0.7–4.0)
LYMPHOCYTES RELATIVE PERCENT: 13.5 %
MEAN CORPUSCULAR HEMOGLOBIN CONC: 32.2 g/dL (ref 30.0–36.0)
MEAN CORPUSCULAR HEMOGLOBIN: 26.7 pg (ref 26.0–34.0)
MEAN CORPUSCULAR VOLUME: 82.8 fL (ref 81.0–95.0)
MEAN PLATELET VOLUME: 9.6 fL (ref 7.0–10.0)
MONOCYTES ABSOLUTE COUNT: 0.5 10*9/L (ref 0.1–1.0)
MONOCYTES RELATIVE PERCENT: 12.1 %
NEUTROPHILS ABSOLUTE COUNT: 2.7 10*9/L (ref 1.7–7.7)
NEUTROPHILS RELATIVE PERCENT: 68.8 %
PLATELET COUNT: 189 10*9/L (ref 150–450)
RED BLOOD CELL COUNT: 4.79 10*12/L (ref 4.32–5.72)
WBC ADJUSTED: 3.9 10*9/L (ref 3.5–10.5)

## 2020-01-25 LAB — COMPREHENSIVE METABOLIC PANEL
ALBUMIN: 4.5 g/dL (ref 3.4–5.0)
ALKALINE PHOSPHATASE: 230 U/L — ABNORMAL HIGH (ref 46–116)
ALT (SGPT): 12 U/L (ref 10–49)
ANION GAP: 5 mmol/L (ref 5–14)
BILIRUBIN TOTAL: 0.4 mg/dL (ref 0.3–1.2)
BLOOD UREA NITROGEN: 34 mg/dL — ABNORMAL HIGH (ref 9–23)
BUN / CREAT RATIO: 14
CALCIUM: 10.5 mg/dL — ABNORMAL HIGH (ref 8.7–10.4)
CHLORIDE: 109 mmol/L — ABNORMAL HIGH (ref 98–107)
CO2: 24.6 mmol/L (ref 20.0–31.0)
CREATININE: 2.36 mg/dL — ABNORMAL HIGH
EGFR CKD-EPI NON-AA MALE: 35 mL/min/{1.73_m2} — ABNORMAL LOW (ref >=60)
GLUCOSE RANDOM: 141 mg/dL (ref 70–179)
POTASSIUM: 4.4 mmol/L (ref 3.4–4.5)
PROTEIN TOTAL: 7.8 g/dL (ref 5.7–8.2)
SODIUM: 139 mmol/L (ref 135–145)

## 2020-01-25 LAB — URINALYSIS
BACTERIA: NONE SEEN /HPF
BILIRUBIN UA: NEGATIVE
BLOOD UA: NEGATIVE
GLUCOSE UA: NEGATIVE
KETONES UA: NEGATIVE
LEUKOCYTE ESTERASE UA: NEGATIVE
NITRITE UA: NEGATIVE
RBC UA: <1 /HPF (ref <3)
SPECIFIC GRAVITY UA: 1.015 (ref 1.005–1.030)
SQUAMOUS EPITHELIAL: <1 /HPF (ref 0–5)
UROBILINOGEN UA: 0.2
WBC UA: 2 /HPF — ABNORMAL HIGH (ref <2)

## 2020-01-25 LAB — LIPID PANEL
CHOLESTEROL/HDL RATIO SCREEN: 7.4 — ABNORMAL HIGH (ref 1.0–4.5)
CHOLESTEROL: 199 mg/dL (ref <=200)
HDL CHOLESTEROL: 27 mg/dL — ABNORMAL LOW (ref 40–60)
LDL CHOLESTEROL CALCULATED: 96 mg/dL (ref 40–99)
VLDL CHOLESTEROL CAL: 76.2 mg/dL — ABNORMAL HIGH (ref 10–50)

## 2020-01-25 LAB — MAGNESIUM: Magnesium:MCnc:Pt:Ser/Plas:Qn:: 1.8

## 2020-01-25 LAB — PROTEIN/CREAT RATIO, URINE: Protein/Creatinine:MRto:Pt:Urine:Qn:: 0.125

## 2020-01-25 LAB — PHOSPHORUS: Phosphate:MCnc:Pt:Ser/Plas:Qn:: 2.9

## 2020-01-25 LAB — ESTIMATED AVERAGE GLUCOSE: Estimated average glucose:MCnc:Pt:Bld:Qn:Estimated from glycated hemoglobin: 108

## 2020-01-25 LAB — BILIRUBIN UA: Bilirubin:PrThr:Pt:Urine:Ord:Test strip: NEGATIVE

## 2020-01-25 LAB — RED CELL DISTRIBUTION WIDTH: Lab: 15.4 — ABNORMAL HIGH

## 2020-01-25 LAB — ALBUMIN: Albumin:MCnc:Pt:Ser/Plas:Qn:: 4.5

## 2020-01-25 MED ORDER — CARVEDILOL 6.25 MG TABLET
ORAL_TABLET | Freq: Two times a day (BID) | ORAL | 3 refills | 90.00000 days | Status: CP
Start: 2020-01-25 — End: 2021-01-24

## 2020-01-25 MED FILL — SODIUM BICARBONATE 650 MG TABLET: 30 days supply | Qty: 120 | Fill #8 | Status: AC

## 2020-01-25 MED FILL — AMLODIPINE 5 MG TABLET: ORAL | 30 days supply | Qty: 60 | Fill #9

## 2020-01-25 MED FILL — MYFORTIC 180 MG TABLET,DELAYED RELEASE: ORAL | 30 days supply | Qty: 120 | Fill #8

## 2020-01-25 MED FILL — ACETAMINOPHEN 500 MG TABLET: 13 days supply | Qty: 100 | Fill #8 | Status: AC

## 2020-01-25 MED FILL — ENVARSUS XR 1 MG TABLET,EXTENDED RELEASE: ORAL | 30 days supply | Qty: 60 | Fill #3

## 2020-01-25 MED FILL — ENVARSUS XR 1 MG TABLET,EXTENDED RELEASE: 30 days supply | Qty: 60 | Fill #3 | Status: AC

## 2020-01-25 MED FILL — ACETAMINOPHEN 500 MG TABLET: ORAL | 13 days supply | Qty: 100 | Fill #8

## 2020-01-25 MED FILL — ENVARSUS XR 4 MG TABLET,EXTENDED RELEASE: ORAL | 30 days supply | Qty: 120 | Fill #3

## 2020-01-25 MED FILL — SODIUM BICARBONATE 650 MG TABLET: ORAL | 30 days supply | Qty: 120 | Fill #8

## 2020-01-25 MED FILL — MYFORTIC 180 MG TABLET,DELAYED RELEASE: 30 days supply | Qty: 120 | Fill #8 | Status: AC

## 2020-01-25 MED FILL — AMLODIPINE 5 MG TABLET: 30 days supply | Qty: 60 | Fill #9 | Status: AC

## 2020-01-25 MED FILL — ENVARSUS XR 4 MG TABLET,EXTENDED RELEASE: 30 days supply | Qty: 120 | Fill #3 | Status: AC

## 2020-01-25 NOTE — Unmapped (Signed)
[facility] TR[facility] PHARMACY NOTE  01/25/2020   Russell Wade  [id number]    Medication changes today:   1. Stop magnesium    Education/Adherence tools provided today:  1. provided additional education on immunosuppression and transplant related medications including reviewing indications of medications, dosing and side effects    Follow up items:  1. goal of understanding indications and dosing of immunosuppression medications  2. If he gets COVID vaccine    Next visit with pharmacy in PRN  ____________________________________________________________________    Russell Wade is a 34 y.o. 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    Other PMH significant for parathyroidectomy with autotransplant    Post op course complicated by: DGF requiring maintenance on HD. Last HD 9/28    Seen by pharmacy today for: medication management and pill box fill and adherence education; last seen by pharmacy 10 months ago     CC:  Patient has no complaints today    There were no vitals filed for this visit.    Allergies   Allergen Reactions   ??? Tuberculin Ppd      Other reaction(s): Skin Rash       All medications reviewed and updated.     Medication list includes revisions made during today???s encounter    Outpatient Encounter Medications as of 01/25/2020   Medication Sig Dispense Refill   ??? acetaminophen (TYLENOL) 500 MG tablet Take 1-2 tablets (500-1,000 mg total) by mouth every six (6) hours as needed for pain or fever (> 38C). 100 tablet 11   ??? amLODIPine (NORVASC) 5 MG tablet Take 2 tablets (10 mg total) by mouth daily. 60 tablet 11   ??? aspirin (ECOTRIN) 81 MG tablet HOLD until directed to start in clinic. (Patient not taking: Reported on 11/17/2019) 30 tablet 11   ??? carvediloL (COREG) 6.25 MG tablet TAKE 1 TABLET (6.25 MG TOTAL) BY MOUTH TWO (2) TIMES A DAY. 180 tablet 3   ??? chlorthalidone (HYGROTON) 25 MG tablet Take 1 tablet (25 mg total) by mouth every morning. 30 tablet 11   ??? famotidine (PEPCID) 20 MG tablet Take 1 tablet (20 mg total) by mouth daily. 30 tablet 5   ??? magnesium oxide-Mg AA chelate (MAGNESIUM, AMINO ACID CHELATE,) 133 mg Tab Take 1 tablet by mouth daily. 30 tablet 11   ??? mycophenolate (MYFORTIC) 180 MG EC tablet Take 2 tablets (360 mg total) by mouth Two (2) times a day. 120 tablet 11   ??? sodium bicarbonate 650 mg tablet Take 2 tablets (1,300 mg total) by mouth Two (2) times a day. 120 tablet 11   ??? sodium zirconium cyclosilicate (LOKELMA) 5 gram PwPk packet Take 2 packets (10 g total) by mouth two (2) times a day as needed. (Patient not taking: Reported on 11/17/2019)     ??? tacrolimus (ENVARSUS XR) 1 mg Tb24 extended release tablet Take 2 tablets (2 mg total) by mouth daily with 4 (4 mg) tablets for a total dose of 18 mg daily 60 tablet 11   ??? tacrolimus (ENVARSUS XR) 4 mg Tb24 extended release tablet Take 4 tablets (16 mg total) by mouth daily with 2 (1mg ) tablets for a total dose of 18 mg daily 120 tablet 3   ??? zolpidem (AMBIEN) 10 mg tablet Take 10 mg by mouth nightly as needed for sleep.     ??? [DISCONTINUED] sodium polystyrene sulfonate (SODIUM  POLYSTYRENE) 15 gram/60 mL Susp Take 120 mL (30 g total) by mouth daily for 2 days. 240 mL 1     No facility-administered encounter medications on file as of 01/25/2020.     Induction agent : alemtuzumab    CURRENT IMMUNOSUPPRESSION: Envarsus 18 mg PO qd  prograf/Envarsus/cyclosporine goal: 6-8   myfortic360  mg PO bid    steroid free     Patient complains of tremor (mild and manageable), occasional HA    IMMUNOSUPPRESSION DRUG LEVELS:  Lab Results   Component Value Date    Tacrolimus, Trough 6.2 10/20/2019    Tacrolimus, Trough 5.0 09/29/2019    Tacrolimus, Trough 6.4 08/25/2019    Tacrolimus, Trough 10.0 06/03/2019    Tacrolimus, Trough <0.5 03/17/2014    Tacrolimus, Trough 7.7 04/05/2013     No results found for: CYCLO  No results found for: EVEROLIMUS  No results found for: SIROLIMUS    Envarsus level is 27h level    Graft function: stable  DSA: ntd  Biopsies to date: zero hour biopsy negative for diagnostic abnormalities  WBC/ANC:  wnl    Plan: Per Dr. Carlene Coria will plan for biopsy.    OI Prophylaxis:   CMV Status: D+/ R+, moderate risk . CMV prophylaxis: valganciclovir 450 mg two days per week (Mon/Thur) x 3 months complete  Lab Results   Component Value Date    CMV Quant <50 (H) 10/20/2019    CMV Quant <50 (H) 09/29/2019    CMV Quant 213 (H) 08/25/2019    CMV Quant 1468 08/22/2008     PCP Prophylaxis: bactrim SS 1 tab MWF x 6 months complete  Thrush: completed in hospital  Patient is  tolerating infectious prophylaxis well    Plan: Continue per protocol. Continue to monitor.    CV Prophylaxis:  The ASCVD Risk score Denman George DC Montez Hageman, et al., 2013) failed to calculate.  Statin therapy: Not indicated given age; currently on no statin  Plan: Continue to monitor.      BP: Goal < 140/90. Clinic vitals reported above  Home BP ranges: not checking at home  Current meds include: amlodipine 10 mg daily, carvedilol 6.25mg  BID, chlorthalidone 25 mg daily  Plan: within goal at clinic. Continue to monitor    Anemia of CKD:  H/H:   Lab Results   Component Value Date    HGB 11.9 (L) 11/17/2019     Lab Results   Component Value Date    HCT 36.2 (L) 11/17/2019     Iron panel:  Lab Results   Component Value Date    IRON 187 (H) 03/25/2019    TIBC 245.3 (L) 03/25/2019    FERRITIN 2,720.0 (H) 03/25/2019     Lab Results   Component Value Date    Iron Saturation (%) 76 (H) 03/25/2019    Iron Saturation (%) 15 (L) 03/21/2014     Plan: stable. Continue to monitor.     DM:   Lab Results   Component Value Date    A1C 4.9 02/20/2019   . Goal A1c < 7  History of Dm? No  Diet:did not address  Exercise:not yet  Hypoglycemia: no   Fluid intake: 4-5 bottles daily  Plan:  Continue to monitor    Electrolytes: wnl  Meds currently on: sodium bicarbonate 1300 mg BID, mg plus protein 133 mg daily, has Lokelma PRN but not currently using  Plan: Stop magnesium.  Of note, he did not tolerate Sensipar pre transplant due to GI effects.  Continue to monitor     GI/BM: pt reports diarrhea and occasional heartburn  Meds currently on: famotidine 20 mg daily loperamide (pt does not think it is effective), using pepto bismol for diarrhea  Plan: Continue to monitor    Pain: pt reports no npain  Meds currently on: APAP PRN   Plan: Continue to monitor    Sleep: Pt reports difficulty sleeping  Meds currently on: zolpidem 10mg  nightly  Plan: Continue to monitor    Bone health:   Vitamin D Level: 11.1 08/25/19. Goal > 30.   Last DEXA results:  none available  Current meds include: none  Plan: Vitamin D level  out of goal,won't add Vit D given elevated calcium. Continue to monitor.     Women's/Men's Health:  Rylon Poitra is a 34 y.o. male. Patient reports no men's/women's health issues  Plan: Continue to monitor    Pharmacy preference:  [facility]    Adherence: Patient has average understanding of medications; was able to independently identify names/doses of immunosuppressants and OI meds.  Patient  does fill their own pill box on a regular basis at home.  Patient brought medication card:no  Pill box:did not bring  Plan: ; provided extensive adherence counseling/intervention    Spent approximately 15 minutes on educating this patient and greater than 50% was spent in direct face to face counseling regarding post transplant medication education. Questions and concerns were address to patient's satisfaction.    Patient was reviewed with Dr. Carlene Coria who was agreement with the stated plan:     During this visit, the following was completed:   BG log data assessment  BP log data assessment  Labs ordered and evaluated  complex treatment plan >1 DS   Patient education was completed for 11-24 minutes     All questions/concerns were addressed to the patient's satisfaction.  __________________________________________    Cleone Slim, PHARMD, CPP  [employer] TRANSPL[facility] CLINICAL PHARMACIST PRACTITIONER  PAGER [phone]

## 2020-01-25 NOTE — Unmapped (Signed)
Transplant Nephrology Clinic Visit      History of Present Illness    Russell Wade is a 34 y.o. 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 He does not have evidence of donor specific antibodies as of 09/29/2019.    This is his second kidney transplant, his first was from a deceased donor on November 28, 2007 and failed 11/24/2013 secondary to rejection. He underwent transplant nephrectomy 03/20/2014.    This transplant has been complicated by delayed graft function, he required dialysis for low urine output and was discharged from the hospital on dialysis. He was initially on Myfortic 720 mg BID, reduced to 540 mg BID on 03/17/19 for diarrhea. His last dialysis was 03/12/19. He underwent transplant renal biopsy on 03/14/19 that showed diffuse subacute tubular injury with no evidence of rejection. His ureteral stent was removed 03/28/19. Required Kayexalate and Lokelma intermittently for hyperkalemia, Bactrim held. Envarsus levels elevated/labile as well. Required some Aranesp for anemia.     After stopping dialysis, his creatinine slowly trended down into the mid 2s in December, the started to rise again into the 3s and back into the mid 2s in January 2021. For that reason, we recommended biopsy at the end of January. He finally scheduled the biopsy for 07/29/19. A day prior to that biopsy, he had a COVID exposure and the biopsy was rescheduled for 08/08/19. Unfortunately he did not show for that biopsy due to not having a ride. At his visit on 08/25/19 he could not recall when he took his Envarsus the day prior, and we noted that there were a lot of issues getting a reliable trough. We recommended admission for biopsy the next day but he refused due to work and family obligations. He did agree to schedule a biopsy the following week. He was scheduled for biopsy 09/05/19 but again he no showed. He was seen in clinic on 10/20/19 with creatinine of 2.44, has not had labs since that visit.    Interval history since last visit 11/17/2019:    He was a no show for his appointment with me on 12/21/19.    He saw audiology on 01/10/20 and was shown to have mild bilateral sensorineural hearing loss, unchanged from 2014. They recommended he return for a hearing aid consult on 7/29, do not see that he showed for that, rescheduled for 9/13.     He has not had labs in our system since 11/17/19.     He presents today for follow up. He was 2.5 hours late for his appointment (did go for imaging first).    He has been feeling well and is without physical complaints today.    We discussed his trouble getting regular labs. He reports being told that he should get labs at [facility] due to concern that the levels may be different. It is too far for him to get there before or after work (he works nights).    He specifically denies fever, myalgias, upper respiratory/GI symptoms, or change in taste/smell.    Review of Systems    Otherwise on review of systems patient denies fever or chills, chest pain, SOB, PND or orthopnea, lower extremity edema. Denies N/V/abdominal pain. No dysuria, hematuria or difficulty voiding. Bowel movements normal. Denies joint pain or rash. All other systems are reviewed and are negative.    Medications    Current Outpatient Medications   Medication Sig Dispense Refill   ??? acetaminophen (TYLENOL) 500 MG tablet Take 1-2 tablets (  500-1,000 mg total) by mouth every six (6) hours as needed for pain or fever (> 38C). 100 tablet 11   ??? amLODIPine (NORVASC) 5 MG tablet Take 2 tablets (10 mg total) by mouth daily. 60 tablet 11   ??? carvediloL (COREG) 6.25 MG tablet TAKE 1 TABLET (6.25 MG TOTAL) BY MOUTH TWO (2) TIMES A DAY. 180 tablet 3   ??? chlorthalidone (HYGROTON) 25 MG tablet Take 1 tablet (25 mg total) by mouth every morning. 30 tablet 11   ??? mycophenolate (MYFORTIC) 180 MG EC tablet Take 2 tablets (360 mg total) by mouth Two (2) times a day. 120 tablet 11   ??? sodium bicarbonate 650 mg tablet Take 2 tablets (1,300 mg total) by mouth Two (2) times a day. 120 tablet 11   ??? sodium zirconium cyclosilicate (LOKELMA) 5 gram PwPk packet Take 2 packets (10 g total) by mouth two (2) times a day as needed. (Patient not taking: Reported on 11/17/2019)     ??? tacrolimus (ENVARSUS XR) 1 mg Tb24 extended release tablet Take 2 tablets (2 mg total) by mouth daily with 4 (4 mg) tablets for a total dose of 18 mg daily 60 tablet 11   ??? tacrolimus (ENVARSUS XR) 4 mg Tb24 extended release tablet Take 4 tablets (16 mg total) by mouth daily with 2 (1mg ) tablets for a total dose of 18 mg daily 120 tablet 3   ??? zolpidem (AMBIEN) 10 mg tablet Take 10 mg by mouth nightly as needed for sleep.       No current facility-administered medications for this visit.       Physical Exam    BP 135/86 (BP Site: R Arm, BP Position: Sitting, BP Cuff Size: Medium)  - Pulse 75  - Temp 36.3 ??C (97.3 ??F) (Temporal)  - Wt 71.6 kg (157 lb 12.8 oz)  - BMI 23.30 kg/m??   General: Patient is a pleasant male in no apparent distress.  Eyes: Sclera anicteric.  ENT: Mask in place.  Neck: Supple without LAD/JVD/bruits.  Lungs: Clear to auscultation bilaterally, no wheezes/rales/rhonchi.  Cardiovascular: Regular rate and rhythm without murmurs, rubs or gallops.  Abdomen: Soft, notender/nondistended. Positive bowel sounds. No tenderness over the graft.  Extremities: Without edema, joints without evidence of synovitis.  Skin: Without rash.  Neurological: Grossly nonfocal, except for hearing loss.  Psychiatric: Mood and affect appropriate.    Laboratory Results    Recent Results (from the past 170 hour(s))   Comprehensive Metabolic Panel    Collection Time: 01/25/20 10:45 AM   Result Value Ref Range    Sodium 139 135 - 145 mmol/L    Potassium 4.4 3.4 - 4.5 mmol/L    Chloride 109 (H) 98 - 107 mmol/L    Anion Gap 5 5 - 14 mmol/L    CO2 24.6 20.0 - 31.0 mmol/L    BUN 34 (H) 9 - 23 mg/dL    Creatinine 1.61 (H) 0.60 - 1.10 mg/dL    BUN/Creatinine Ratio 14     EGFR CKD-EPI Non-African American, Male 35 (L) >=60 mL/min/1.2m2    EGFR CKD-EPI African American, Male 40 (L) >=60 mL/min/1.82m2    Glucose 141 70 - 179 mg/dL    Calcium 09.6 (H) 8.7 - 10.4 mg/dL    Albumin 4.5 3.4 - 5.0 g/dL    Total Protein 7.8 5.7 - 8.2 g/dL    Total Bilirubin 0.4 0.3 - 1.2 mg/dL    AST 19 <=04 U/L    ALT 12 10 -  49 U/L    Alkaline Phosphatase 230 (H) 46 - 116 U/L   Phosphorus Level    Collection Time: 01/25/20 10:45 AM   Result Value Ref Range    Phosphorus 2.9 2.4 - 5.1 mg/dL   Magnesium Level    Collection Time: 01/25/20 10:45 AM   Result Value Ref Range    Magnesium 1.8 1.6 - 2.6 mg/dL   CMV DNA, quantitative, PCR    Collection Time: 01/25/20 10:45 AM   Result Value Ref Range    CMV Viral Ld Not Detected Not Detected    CMV Quant      CMV Quant Log10      CMV Comment     Tacrolimus Level, Trough    Collection Time: 01/25/20 10:45 AM   Result Value Ref Range    Tacrolimus, Trough 7.8 5.0 - 15.0 ng/mL   Protein/Creatinine Ratio, Urine    Collection Time: 01/25/20 10:45 AM   Result Value Ref Range    Creat U 165.8 Undefined mg/dL    Protein, Ur 09.8 mg/dL    Protein/Creatinine Ratio, Urine 0.125 Undefined   Urinalysis    Collection Time: 01/25/20 10:45 AM   Result Value Ref Range    Color, UA Yellow     Clarity, UA Clear     Specific Gravity, UA 1.015 1.005 - 1.030    pH, UA 6.0 5.0 - 9.0    Leukocyte Esterase, UA Negative Negative    Nitrite, UA Negative Negative    Protein, UA Negative Negative    Glucose, UA Negative Negative    Ketones, UA Negative Negative    Urobilinogen, UA 0.2 mg/dL 0.2 - 2.0 mg/dL    Bilirubin, UA Negative Negative    Blood, UA Negative Negative    RBC, UA <1 <3 /HPF    WBC, UA 2 (H) <2 /HPF    Squam Epithel, UA <1 0 - 5 /HPF    Bacteria, UA None Seen None Seen /HPF   Cytology - Urine    Collection Time: 01/25/20 10:45 AM   Result Value Ref Range    Final Diagnosis       A. Urine, voided  - Single cohesive cluster of urothelial cells in voided urine  - No decoy cells identified  - Mixed inflammation  - Degenerated specimen    This electronic signature is attestation that the pathologist personally reviewed the submitted material(s) and the final diagnosis reflects that evaluation.      Gross Description            Urine voided decoy    60mL clear, yellow fluid, unfixed    Materials Prepared & Examined    Smear Slides.......0  Monolayers..........1  Cytospins.............0  Cell Blocks...........0  Core Biopsy.........0  Touch Prep..........0      Microscopic Description       Microscopic examination substantiates the above diagnosis.    Resident Physician: None Assigned      EMBEDDED IMAGES      Specimen Adequacy Satisfactory for evaluation     Disclaimer       Unless otherwise specified, specimens are preserved using 10% neutral buffered formalin. For cases in which immunohistochemical and/or in-situ hybridization stains are performed, the following statement applies: Appropriate controls for each stain (positive controls with or without negative controls) have been evaluated and stain as expected. These stains have not been separately validated for use on decalcified specimens and should be interpreted with caution in that setting. Some of the reagents used for  these stains may be classified as analyte specific reagents (ASR). Tests using ASRs were developed, and their performance characteristics were determined, by the Anatomic Pathology Department [facility]). They have not been cleared or approved by the [place] Food and Drug Administration (FDA). The FDA does not require these tests to go through premarket FDA review. These tests are used for clinical purposes. They should not be regarded as investigational or for research. This laboratory is certified under the Clinical Laboratory Improvement Amendments (CLIA) as qualified to perform high complexity clinical laboratory testing.     HIV Antigen/Antibody Combo    Collection Time: 01/25/20 10:45 AM Result Value Ref Range    HIV Antigen/Antibody Combo Nonreactive Nonreactive   EBV QUANTITATIVE PCR, BLOOD    Collection Time: 01/25/20 10:45 AM   Result Value Ref Range    EBV Viral Load Result Not Detected Not Detected   Lipid Panel    Collection Time: 01/25/20 10:45 AM   Result Value Ref Range    Triglycerides 381 (H) 0 - 150 mg/dL    Cholesterol 284 <=132 mg/dL    HDL 27 (L) 40 - 60 mg/dL    LDL Calculated 96 40 - 99 mg/dL    VLDL Cholesterol Cal 76.2 (H) 10 - 50 mg/dL    Chol/HDL Ratio 7.4 (H) 1.0 - 4.5    Non-HDL Cholesterol 172 (H) 70 - 130 mg/dL    FASTING No    Hemoglobin A1c    Collection Time: 01/25/20 10:45 AM   Result Value Ref Range    Hemoglobin A1C 5.4 4.8 - 5.6 %    Estimated Average Glucose 108 mg/dL   Vitamin D 25 Hydroxy (25OH D2 + D3)    Collection Time: 01/25/20 10:45 AM   Result Value Ref Range    Vitamin D Total (25OH) 6.8 (L) 20.0 - 80.0 ng/mL   CBC w/ Differential    Collection Time: 01/25/20 10:45 AM   Result Value Ref Range    WBC 3.9 3.5 - 10.5 10*9/L    RBC 4.79 4.32 - 5.72 10*12/L    HGB 12.8 (L) 13.5 - 17.5 g/dL    HCT 44.0 10.2 - 72.5 %    MCV 82.8 81.0 - 95.0 fL    MCH 26.7 26.0 - 34.0 pg    MCHC 32.2 30.0 - 36.0 g/dL    RDW 36.6 (H) 44.0 - 15.0 %    MPV 9.6 7.0 - 10.0 fL    Platelet 189 150 - 450 10*9/L    Neutrophils % 68.8 %    Lymphocytes % 13.5 %    Monocytes % 12.1 %    Eosinophils % 5.0 %    Basophils % 0.6 %    Absolute Neutrophils 2.7 1.7 - 7.7 10*9/L    Absolute Lymphocytes 0.5 (L) 0.7 - 4.0 10*9/L    Absolute Monocytes 0.5 0.1 - 1.0 10*9/L    Absolute Eosinophils 0.2 0.0 - 0.7 10*9/L    Absolute Basophils 0.0 0.0 - 0.1 10*9/L       Assessment and Plan    1. Status post second renal transplant. His creatinine had come down to 1.74 at his last visit 11/17/19, which was the lowest it had been since transplant. I was comfortable holding off on biopsy at that time as long as the creatinine continued to fall, but then he did not get any labs following that visit. Today his creatinine is back up to 2.36. It is not clear that he has  ever had a therapeutic tacrolimus level, as he takes his Envarsus at 10 am but gets labs drawn later in the afternoon. We again spent the bulk of the visit discussing that we need labs and accurate trough levels to help him have success with this transplant. He thinks he would be more successful getting regular labs at [facility] so we will send orders there. His tacrolimus trough of 7.8 is a 24.5 hour trough and within goal of 6-8. Will continue current immunosuppression.    We discussed that since his creatinine is back up and that he is high risk for rejection with his sensitization, that I would really like a biopsy to make sure there is no smoldering rejection. His main barrier to biopsy has been arranging transportation. In the past we had discussed admitting him for biopsy, but given the current bed crisis I cannot justify taking a hospital bed at this time and I explained that to him. He says that he will be able to arrange a ride and will schedule in the next 1-2 weeks.    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 Did see audiology and was found to have stable hearing loss. Has follow up 02/27/20.    3. Hypertension. Blood pressure well controlled current regimen, 135/86 today.    4. Hyperkalemia. Resolved.    5. Hypomagnesemia. Magnesium 1.8 today, supplement discontinued by pharmacy.     6. Metabolic acidosis. CO2 at goal (25 today) on current supplementation.    7. Hyperlipidemia. Triglycerides have been elevated (381 today from 415 11/17/19), did some dietary counseling. May need to treat if remain high.     8. Health maintenance. Had flu shot in the fall, has not received pneumococcal vaccines. Got first dose of COVID vaccine today, will give the second dose at follow up.    We discussed that while we continue to recommend the COVID vaccine for all eligible patients, we know that kidney transplant patients do not respond as strongly due to their immunosuppression, so he should continue to follow the CDC guidelines for patients who are not vaccinated. I would also encourage all household members and close contacts that are eligible for vaccination to get vaccinated. We discussed the importance of ongoing social distancing, wearing a mask outside the home, and hand washing/sanitizing.     9. Will see patient back in 4 weeks, or sooner if needed. Hopefully will get biopsy in the meantime.

## 2020-01-26 LAB — CMV DNA, QUANTITATIVE, PCR

## 2020-01-26 LAB — VITAMIN D, TOTAL (25OH): Lab: 6.8 — ABNORMAL LOW

## 2020-01-26 LAB — TACROLIMUS, TROUGH: Lab: 7.8

## 2020-01-26 LAB — HIV ANTIGEN/ANTIBODY COMBO: HIV 1+2 Ab+HIV1 p24 Ag:PrThr:Pt:Ser/Plas:Ord:IA: NONREACTIVE

## 2020-01-26 LAB — CMV COMMENT: Lab: 0

## 2020-01-27 LAB — EBV VIRAL LOAD RESULT: Lab: NOT DETECTED

## 2020-01-28 LAB — BK VIRUS QUANTITATIVE PCR, BLOOD: BK BLOOD RESULT: NOT DETECTED

## 2020-01-28 LAB — BK BLOOD RESULT: Lab: NOT DETECTED

## 2020-02-08 LAB — HLA DS POST TRANSPLANT
ANTI-DONOR DRW #1 MFI: 39 MFI
ANTI-DONOR DRW #2 MFI: 39 MFI
ANTI-DONOR HLA-A #1 MFI: 17 MFI
ANTI-DONOR HLA-A #2 MFI: 25 MFI
ANTI-DONOR HLA-B #1 MFI: 43 MFI
ANTI-DONOR HLA-B #2 MFI: 9 MFI
ANTI-DONOR HLA-C #1 MFI: 0 MFI
ANTI-DONOR HLA-C #2 MFI: 39 MFI
ANTI-DONOR HLA-DP AG #1 MFI: 31 MFI
ANTI-DONOR HLA-DQB #1 MFI: 379 MFI
ANTI-DONOR HLA-DR #1 MFI: 31 MFI
ANTI-DONOR HLA-DR #2 MFI: 31 MFI

## 2020-02-08 LAB — FSAB CLASS 1 ANTIBODY SPECIFICITY: HLA CLASS 1 ANTIBODY RESULT: POSITIVE

## 2020-02-08 LAB — FSAB CLASS 2 ANTIBODY SPECIFICITY: HLA CL2 AB RESULT: POSITIVE

## 2020-02-21 DIAGNOSIS — Z79899 Other long term (current) drug therapy: Principal | ICD-10-CM

## 2020-02-21 DIAGNOSIS — E559 Vitamin D deficiency, unspecified: Principal | ICD-10-CM

## 2020-02-21 DIAGNOSIS — Z940 Kidney transplant status: Principal | ICD-10-CM

## 2020-02-22 ENCOUNTER — Ambulatory Visit: Admit: 2020-02-22 | Discharge: 2020-02-23 | Payer: MEDICARE

## 2020-02-22 ENCOUNTER — Ambulatory Visit: Admit: 2020-02-22 | Discharge: 2020-02-23 | Payer: MEDICARE | Attending: Nephrology | Primary: Nephrology

## 2020-02-22 DIAGNOSIS — Z79899 Other long term (current) drug therapy: Principal | ICD-10-CM

## 2020-02-22 DIAGNOSIS — Z940 Kidney transplant status: Principal | ICD-10-CM

## 2020-02-22 DIAGNOSIS — E559 Vitamin D deficiency, unspecified: Principal | ICD-10-CM

## 2020-02-22 DIAGNOSIS — D849 Immunodeficiency, unspecified: Principal | ICD-10-CM

## 2020-02-22 DIAGNOSIS — Z23 Encounter for immunization: Secondary | ICD-10-CM | POA: Diagnosis not present

## 2020-02-22 LAB — CBC W/ AUTO DIFF
BASOPHILS ABSOLUTE COUNT: 0 10*9/L (ref 0.0–0.1)
BASOPHILS RELATIVE PERCENT: 1.1 %
EOSINOPHILS ABSOLUTE COUNT: 0.5 10*9/L (ref 0.0–0.7)
EOSINOPHILS RELATIVE PERCENT: 11.7 %
HEMATOCRIT: 39 % (ref 38.0–50.0)
HEMOGLOBIN: 12.5 g/dL — ABNORMAL LOW (ref 13.5–17.5)
LYMPHOCYTES ABSOLUTE COUNT: 0.5 10*9/L — ABNORMAL LOW (ref 0.7–4.0)
MEAN CORPUSCULAR HEMOGLOBIN CONC: 32.2 g/dL (ref 30.0–36.0)
MEAN CORPUSCULAR HEMOGLOBIN: 26.3 pg (ref 26.0–34.0)
MEAN PLATELET VOLUME: 9 fL (ref 7.0–10.0)
MONOCYTES ABSOLUTE COUNT: 0.4 10*9/L (ref 0.1–1.0)
MONOCYTES RELATIVE PERCENT: 11.3 %
NEUTROPHILS ABSOLUTE COUNT: 2.4 10*9/L (ref 1.7–7.7)
NUCLEATED RED BLOOD CELLS: 0 /100{WBCs} (ref <=4)
PLATELET COUNT: 181 10*9/L (ref 150–450)
RED CELL DISTRIBUTION WIDTH: 15.1 % — ABNORMAL HIGH (ref 12.0–15.0)
WBC ADJUSTED: 3.9 10*9/L (ref 3.5–10.5)

## 2020-02-22 LAB — COMPREHENSIVE METABOLIC PANEL
ALBUMIN: 4.5 g/dL (ref 3.4–5.0)
ALT (SGPT): 10 U/L (ref 10–49)
ANION GAP: 3 mmol/L — ABNORMAL LOW (ref 5–14)
AST (SGOT): 14 U/L (ref <=34)
BILIRUBIN TOTAL: 0.4 mg/dL (ref 0.3–1.2)
BLOOD UREA NITROGEN: 22 mg/dL (ref 9–23)
BUN / CREAT RATIO: 10
CALCIUM: 11.1 mg/dL — ABNORMAL HIGH (ref 8.7–10.4)
CHLORIDE: 111 mmol/L — ABNORMAL HIGH (ref 98–107)
CO2: 24.2 mmol/L (ref 20.0–31.0)
CREATININE: 2.13 mg/dL — ABNORMAL HIGH
EGFR CKD-EPI AA MALE: 45 mL/min/{1.73_m2} — ABNORMAL LOW (ref >=60)
EGFR CKD-EPI NON-AA MALE: 39 mL/min/{1.73_m2} — ABNORMAL LOW (ref >=60)
GLUCOSE RANDOM: 100 mg/dL (ref 70–179)
POTASSIUM: 5.4 mmol/L — ABNORMAL HIGH (ref 3.4–4.5)
SODIUM: 138 mmol/L (ref 135–145)

## 2020-02-22 LAB — URINALYSIS
BILIRUBIN UA: NEGATIVE
BLOOD UA: NEGATIVE
GLUCOSE UA: NEGATIVE
KETONES UA: NEGATIVE
LEUKOCYTE ESTERASE UA: NEGATIVE
NITRITE UA: NEGATIVE
PROTEIN UA: NEGATIVE
RBC UA: <1 /HPF (ref <3)
SQUAMOUS EPITHELIAL: <1 /HPF (ref 0–5)
UROBILINOGEN UA: 0.2
WBC UA: 1 /HPF (ref <2)

## 2020-02-22 LAB — BASOPHILS ABSOLUTE COUNT: Basophils:NCnc:Pt:Bld:Qn:Automated count: 0

## 2020-02-22 LAB — MAGNESIUM: Magnesium:MCnc:Pt:Ser/Plas:Qn:: 1.6

## 2020-02-22 LAB — PROTEIN / CREATININE RATIO, URINE
CREATININE, URINE: 73.2 mg/dL
PROTEIN URINE: 13.4 mg/dL
PROTEIN/CREAT RATIO, URINE: 0.183

## 2020-02-22 LAB — PARATHYROID HORMONE INTACT: Parathyrin.intact:MCnc:Pt:Ser/Plas:Qn:: 292.7 — ABNORMAL HIGH

## 2020-02-22 LAB — PROTEIN URINE: Protein:MCnc:Pt:Urine:Qn:: 13.4

## 2020-02-22 LAB — BUN / CREAT RATIO: Urea nitrogen/Creatinine:MRto:Pt:Ser/Plas:Qn:: 10

## 2020-02-22 LAB — PROTEIN UA: Protein:MCnc:Pt:Urine:Qn:Test strip: NEGATIVE

## 2020-02-22 LAB — PHOSPHORUS: Phosphate:MCnc:Pt:Ser/Plas:Qn:: 3

## 2020-02-22 NOTE — Unmapped (Signed)
AOBP   Patient's blood pressure was taken on right upper arm, medium cuff.   1st reading 133/95, pulse: 73  2nd reading 140/94, pulse: 73  3rd reading error  Average reading 137/95, pulse: 73     Patient labs drawn in room prior to leaving Nephrology clinic.

## 2020-02-22 NOTE — Unmapped (Signed)
Transplant Nephrology Clinic Visit      History of Present Illness    Russell Wade is a 34 y.o. 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 He does not have evidence of donor specific antibodies as of 09/29/2019.    This is his second kidney transplant, his first was from a deceased donor on 11-26-2007 and failed 11/24/2013 secondary to rejection. He underwent transplant nephrectomy 03/20/2014.    This transplant has been complicated by delayed graft function, he required dialysis for low urine output and was discharged from the hospital on dialysis. He was initially on Myfortic 720 mg BID, reduced to 540 mg BID on 03/17/19 for diarrhea. His last dialysis was 03/12/19. He underwent transplant renal biopsy on 03/14/19 that showed diffuse subacute tubular injury with no evidence of rejection. His ureteral stent was removed 03/28/19. Required Kayexalate and Lokelma intermittently for hyperkalemia, Bactrim held. Envarsus levels elevated/labile as well. Required some Aranesp for anemia.     After stopping dialysis, his creatinine slowly trended down into the mid 2s in December, the started to rise again into the 3s and back into the mid 2s in January 2021. For that reason, we recommended biopsy at the end of January. He finally scheduled the biopsy for 07/29/19. A day prior to that biopsy, he had a COVID exposure and the biopsy was rescheduled for 08/08/19. Unfortunately he did not show for that biopsy due to not having a ride. At his visit on 08/25/19 he could not recall when he took his Envarsus the day prior, and we noted that there were a lot of issues getting a reliable trough. We recommended admission for biopsy the next day but he refused due to work and family obligations. He did agree to schedule a biopsy the following week. He was scheduled for biopsy 09/05/19 but again he no showed. He was seen in clinic on 10/20/19 with creatinine of 2.44, has not had labs since that visit.    He saw audiology on 01/10/20 and was shown to have mild bilateral sensorineural hearing loss, unchanged from 2014. They recommended he return for a hearing aid consult on 7/29, do not see that he showed for that, rescheduled for 9/13.    Interval history since last visit 01/25/2020:    Since last visit patient states he has been doing well.  He has not been checking his BP at home recently. He continues to work nights. He is scheduled to get his second Covid vaccine today. He has no complaints today.     He specifically denies fever, myalgias, upper respiratory/GI symptoms, or change in taste/smell.    Last dose of Prograf: 10 am yesterday.     Review of Systems    Otherwise on review of systems patient denies fever or chills, chest pain, SOB, PND or orthopnea, lower extremity edema. Denies N/V/abdominal pain. No dysuria, hematuria or difficulty voiding. Bowel movements normal. Denies joint pain or rash. All other systems are reviewed and are negative.    Medications    Current Outpatient Medications   Medication Sig Dispense Refill   ??? amLODIPine (NORVASC) 5 MG tablet Take 2 tablets (10 mg total) by mouth daily. 60 tablet 11   ??? carvediloL (COREG) 6.25 MG tablet TAKE 1 TABLET (6.25 MG TOTAL) BY MOUTH TWO (2) TIMES A DAY. 180 tablet 3   ??? mycophenolate (MYFORTIC) 180 MG EC tablet Take 2 tablets (360 mg total) by mouth Two (2) times a day. 120  tablet 11   ??? sodium bicarbonate 650 mg tablet Take 2 tablets (1,300 mg total) by mouth Two (2) times a day. 120 tablet 11   ??? tacrolimus (ENVARSUS XR) 1 mg Tb24 extended release tablet Take 2 tablets (2 mg total) by mouth daily with 4 (4 mg) tablets for a total dose of 18 mg daily 60 tablet 11   ??? tacrolimus (ENVARSUS XR) 4 mg Tb24 extended release tablet Take 4 tablets (16 mg total) by mouth daily with 2 (1mg ) tablets for a total dose of 18 mg daily 120 tablet 3   ??? acetaminophen (TYLENOL) 500 MG tablet Take 1-2 tablets (500-1,000 mg total) by mouth every six (6) hours as needed for pain or fever (> 38C). (Patient not taking: Reported on 02/22/2020) 100 tablet 11   ??? sodium zirconium cyclosilicate (LOKELMA) 5 gram PwPk packet Take 2 packets (10 g total) by mouth two (2) times a day as needed. (Patient not taking: Reported on 11/17/2019)     ??? zolpidem (AMBIEN) 10 mg tablet Take 10 mg by mouth nightly as needed for sleep. (Patient not taking: Reported on 02/22/2020)       No current facility-administered medications for this visit.       Physical Exam    BP 137/95 (BP Site: R Arm, BP Position: Sitting, BP Cuff Size: Medium)  - Pulse 73  - Temp 35.9 ??C (96.6 ??F) (Temporal)  - Ht 175.3 cm (5' 9)  - Wt 71.2 kg (157 lb)  - BMI 23.18 kg/m??   General: Patient is a pleasant male in no apparent distress.  Eyes: Sclera anicteric.  ENT: Mask in place.  Neck: Supple without LAD/JVD/bruits.  Lungs: Clear to auscultation bilaterally, no wheezes/rales/rhonchi.  Cardiovascular: Regular rate and rhythm without murmurs, rubs or gallops.  Abdomen: Soft, notender/nondistended. Positive bowel sounds. No tenderness over the graft.  Extremities: Without edema, joints without evidence of synovitis.  Skin: Without rash.  Neurological: Grossly nonfocal, except for hearing loss.  Psychiatric: Mood and affect appropriate.    Laboratory Results    Lab Results   Component Value Date    WBC 3.9 02/22/2020    RBC 4.77 02/22/2020    HGB 12.5 (L) 02/22/2020    HCT 39.0 02/22/2020    MCV 81.8 02/22/2020    MCH 26.3 02/22/2020    MCHC 32.2 02/22/2020    RDW 15.1 (H) 02/22/2020    PLT 181 02/22/2020     Lab Results   Component Value Date    NA 138 02/22/2020    K 5.4 (H) 02/22/2020    CL 111 (H) 02/22/2020    CO2 24.2 02/22/2020    BUN 22 02/22/2020    CREATININE 2.13 (H) 02/22/2020    GLU 100 02/22/2020    CALCIUM 11.1 (H) 02/22/2020    ALBUMIN 4.5 02/22/2020    PHOS 3.0 02/22/2020     Lab Results   Component Value Date    ALKPHOS 244 (H) 02/22/2020    BILITOT 0.4 02/22/2020    BILIDIR 0.50 (H) 03/22/2018    PROT 7.5 02/22/2020    ALBUMIN 4.5 02/22/2020    ALT 10 02/22/2020    AST 14 02/22/2020     Lab Results   Component Value Date    PTH 292.7 (H) 02/22/2020       Assessment and Plan    1. Status post second renal transplant. His creatinine had come down to 1.74 at his visit 11/17/19, which was the lowest it had  been since transplant. I was comfortable holding off on biopsy at that time as long as the creatinine continued to fall, but then he did not get any labs following that visit. Today his creatinine is back up to 2.13. It is not clear that he has ever had a therapeutic tacrolimus level, as he takes his Envarsus at 10 am but gets labs drawn later in the afternoon. We again spent the bulk of the visit discussing that we need labs and accurate trough levels to help him have success with this transplant. He thinks he would be more successful getting regular labs at [facility], so we will send orders there. His tacrolimus trough of 4.9 is a 27.75 hour trough and just below goal of 6-8. Will continue current immunosuppression.    We discussed that since his creatinine is back up and that he is high risk for rejection with his sensitization, that I would really like a biopsy to make sure there is no smoldering rejection. His main barrier to biopsy has been arranging transportation. In the past we had discussed admitting him for biopsy, but given the current bed crisis I cannot justify taking a hospital bed at this time and I explained that to him. He says that his friend has next Thursday off and he should be able to get his biopsy then.  Will get it scheduled.     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 Did see audiology and was found to have stable hearing loss. Has follow up 02/27/20.    3. Hypertension. Blood pressure well controlled current regimen, 137/95 today.    4. Hyperkalemia. 5.4 today.     5. Hypomagnesemia. Magnesium 1.6 today.     6. Metabolic acidosis. CO2 at goal (24.2 today) on current supplementation.    7. Hyperlipidemia. Triglycerides have been elevated (381 01/25/20 from 415 11/17/19), did some dietary counseling. May need to treat if remain high.     8. Health maintenance. Had flu shot in the fall, has not received pneumococcal vaccines. Moderna 01/25/20 with second dose today.     We discussed that while we continue to recommend the COVID vaccine for all eligible patients, we know that kidney transplant patients do not respond as strongly due to their immunosuppression, so he should continue to follow the CDC guidelines for patients who are not vaccinated. I would also encourage all household members and close contacts that are eligible for vaccination to get vaccinated. We discussed the importance of ongoing social distancing, wearing a mask outside the home, and hand washing/sanitizing.     9. Will see patient back in 4 weeks, or sooner if needed. Hopefully will get biopsy in the meantime.

## 2020-02-23 LAB — BK VIRUS QUANTITATIVE PCR, BLOOD: BK BLOOD RESULT: NOT DETECTED

## 2020-02-23 LAB — TACROLIMUS, TROUGH: Lab: 4.9 — ABNORMAL LOW

## 2020-02-23 LAB — BK BLOOD COMMENT: Lab: 0

## 2020-02-23 NOTE — Unmapped (Signed)
Called patient to give him biopsy information and made him aware of docs sent via mychart

## 2020-02-24 LAB — CMV VIRAL LD: Lab: NOT DETECTED

## 2020-02-24 LAB — CMV DNA, QUANTITATIVE, PCR: CMV VIRAL LD: NOT DETECTED

## 2020-02-25 LAB — VITAMIN D, TOTAL (25OH): Lab: 9.2 — ABNORMAL LOW

## 2020-02-27 ENCOUNTER — Institutional Professional Consult (permissible substitution): Admit: 2020-02-27 | Discharge: 2020-02-28 | Payer: MEDICARE | Attending: Audiologist | Primary: Audiologist

## 2020-02-27 DIAGNOSIS — D849 Immunodeficiency, unspecified: Principal | ICD-10-CM

## 2020-02-27 DIAGNOSIS — Z940 Kidney transplant status: Principal | ICD-10-CM

## 2020-02-27 MED ORDER — AMLODIPINE 5 MG TABLET
ORAL_TABLET | Freq: Every day | ORAL | 11 refills | 30.00000 days | Status: CP
Start: 2020-02-27 — End: 2021-02-26
  Filled 2020-02-29: qty 60, 30d supply, fill #0

## 2020-02-27 MED ORDER — TACROLIMUS XR 4 MG TABLET,EXTENDED RELEASE 24 HR
ORAL_TABLET | Freq: Every day | ORAL | 3 refills | 30 days | Status: CP
Start: 2020-02-27 — End: ?
  Filled 2020-02-29: qty 120, 30d supply, fill #0

## 2020-02-27 NOTE — Unmapped (Signed)
ADULT HEARING AID CONSULTATION                                              Name:  Russell Wade    Date: February 27, 2020    DOB:    1986/04/16   MRN: [id number]   Age:     34 y.o.        [PAIN 0/10]. Russell Wade, a patient with a known Sensorineural hearing loss here to discuss amplification options.    Counseling:  Russell Wade was counseled regarding realistic expectations. Discussed amplification style and technology level best suited for patient's lifestyle. Patient provided contact information for our financial counselor should Russell Wade have any financial concerns or questions. Due to financial concerns, the [facility] Telecommunications program was discussed today. Patient meets the audiologic requirements for eligibility to the state program. Patient was counseled regarding purpose for the state program in assisting with phone communication via Production assistant, radio. Patient reports he is interested in purchasing a hearing aid for the contralateral ear out of pocket.    Telecommunication Program's Certification & Documentation of Equipment Need form along with patient's audiogram were provided to patient today. Patient was given instructions and contact information to the appropriate [place] [facility] regional contact center.    Ordering: [employer] Naida M50 in P8 color for the right ear with custom mold with canal lock from [employer]- pending  [facility] approval   Earmold impression was taken without incident for canal with lock style mold from [employer]    Recommendations:  ??? Return for hearing aid fitting pending state approval  ??? Contact the clinic to schedule earmold impression for LEFT ear should he decide to pursue hearing aid out of pocket for that ear   ??? Continue to monitor hearing annually    I wore appropriate PPE throughout entire appointment (face mask and eye protection). Patient also wore face mask appropriately for the entire appointment.    Procedure(s):  1) No Charge - Quantity: 2    Visit Time: 30 min     Dorothyann Gibbs, AuD  Clinical Audiologist

## 2020-02-27 NOTE — Unmapped (Signed)
Pt request RX Refill tacrolimus (ENVARSUS XR) 4 mg Tb24 extended release tablet

## 2020-02-27 NOTE — Unmapped (Signed)
[facility] Clinical Assessment & Refill Coordination Note    Russell Wade, DOB: 1986/05/28  Phone: [phone] (home)     All above HIPAA information was verified with patient.     Was a Nurse, learning disability used for this call? No    Specialty Medication(s):   Transplant: Envarsus 1mg , Envarsus 4mg  and Myfortic 180mg      Current Outpatient Medications   Medication Sig Dispense Refill   ??? acetaminophen (TYLENOL) 500 MG tablet Take 1-2 tablets (500-1,000 mg total) by mouth every six (6) hours as needed for pain or fever (> 38C). (Patient not taking: Reported on 02/22/2020) 100 tablet 11   ??? amLODIPine (NORVASC) 5 MG tablet Take 2 tablets (10 mg total) by mouth daily. 60 tablet 11   ??? carvediloL (COREG) 6.25 MG tablet TAKE 1 TABLET (6.25 MG TOTAL) BY MOUTH TWO (2) TIMES A DAY. 180 tablet 3   ??? mycophenolate (MYFORTIC) 180 MG EC tablet Take 2 tablets (360 mg total) by mouth Two (2) times a day. 120 tablet 11   ??? sodium bicarbonate 650 mg tablet Take 2 tablets (1,300 mg total) by mouth Two (2) times a day. 120 tablet 11   ??? sodium zirconium cyclosilicate (LOKELMA) 5 gram PwPk packet Take 2 packets (10 g total) by mouth two (2) times a day as needed. (Patient not taking: Reported on 11/17/2019)     ??? tacrolimus (ENVARSUS XR) 1 mg Tb24 extended release tablet Take 2 tablets (2 mg total) by mouth daily with 4 (4 mg) tablets for a total dose of 18 mg daily 60 tablet 11   ??? tacrolimus (ENVARSUS XR) 4 mg Tb24 extended release tablet Take 4 tablets (16 mg total) by mouth daily with 2 (1mg ) tablets for a total dose of 18 mg daily 120 tablet 3   ??? zolpidem (AMBIEN) 10 mg tablet Take 10 mg by mouth nightly as needed for sleep. (Patient not taking: Reported on 02/22/2020)       No current facility-administered medications for this visit.        Changes to medications: Russell Wade reports no changes at this time.    Allergies   Allergen Reactions   ??? Tuberculin Ppd      Other reaction(s): Skin Rash Changes to allergies: No    SPECIALTY MEDICATION ADHERENCE     envarsus 1mg   : 7 days of medicine on hand   envarsus 4mg   : 7 days of medicine on hand   Myfortic 180mg   : 7 days of medicine on hand       Medication Adherence    Patient reported X missed doses in the last month: 0  Specialty Medication: envarsus 1mg   Patient is on additional specialty medications: Yes  Additional Specialty Medications: envarsus 4mg   Patient Reported Additional Medication X Missed Doses in the Last Month: 0  Patient is on more than two specialty medications: Yes  Specialty Medication: myfortic 180mg   Patient Reported Additional Medication X Missed Doses in the Last Month: 0          Specialty medication(s) dose(s) confirmed: Regimen is correct and unchanged.     Are there any concerns with adherence? No    Adherence counseling provided? Not needed    CLINICAL MANAGEMENT AND INTERVENTION      Clinical Benefit Assessment:    Do you feel the medicine is effective or helping your condition? Yes    Clinical Benefit counseling provided? Not needed    Adverse Effects Assessment:  Are you experiencing any side effects? No    Are you experiencing difficulty administering your medicine? No    Quality of Life Assessment:    How many days over the past month did your transplant  keep you from your normal activities? For example, brushing your teeth or getting up in the morning. 0    Have you discussed this with your provider? Not needed    Therapy Appropriateness:    Is therapy appropriate? Yes, therapy is appropriate and should be continued    DISEASE/MEDICATION-SPECIFIC INFORMATION      N/A    PATIENT SPECIFIC NEEDS     - Does the patient have any physical, cognitive, or cultural barriers? No    - Is the patient high risk? Yes, patient is taking a REMS drug. Medication is dispensed in compliance with REMS program    - Does the patient require a Care Management Plan? No     - Does the patient require physician intervention or other additional services (i.e. nutrition, smoking cessation, social work)? No      SHIPPING     Specialty Medication(s) to be Shipped:   Transplant: Envarsus 1mg , Envarsus 4mg  and Myfortic 180mg     Other medication(s) to be shipped: apap, norvasc, coreg, sod bicarb     Changes to insurance: No    Delivery Scheduled: Yes, Expected medication delivery date: 03/01/2020.  However, Rx request for refills was sent to the provider as there are none remaining.     Medication will be delivered via UPS to the confirmed prescription address in [facility].    The patient will receive a drug information handout for each medication shipped and additional FDA Medication Guides as required.  Verified that patient has previously received a Conservation officer, historic buildings.    All of the patient's questions and concerns have been addressed.    [name]   [facility] Specialty Pharmacist

## 2020-02-27 NOTE — Unmapped (Signed)
Kidney Transplant Biopsy Note    Date of Biopsy Referral: 02/27/20    Referring Transplant Provider: Lisbeth Ply, MD   [facility]: [phone]  Pager: [phone]  Kidney Transplant Coordinator: Wincent W Fontenelle      Biopsy Fellow  _____________________ at _______________________ otherwise contact referring provider.     (NAME)   (CONTACT - pager/cell phone #)    ---------------------------------------------------------------------------------------------------------------------  Lamar Benes?: YES  Patient Name: Mr. Garett Tetzloff  MR: [id number]  Age: 34 y.o.  Sex: Male Race: Black or African American Ethnicity: Not Hispanic or Latino   DOB:May 07, 1986    Procedures: Ultrasound Guided Percutaneous Kidney Biopsy under Moderate Sedation  Tissue Submitted: Kidney  Special Studies Required: LM, IF, EM  ----------------------------------------------------------------------------------------------------------------------  Date of allograft implantation: 02/20/2019 (Kidney), 11/12/2007 (Kidney)   ABO Incompatible: No  Underlying native kidney disease: Alport's Syndrome    Was the diagnosis established by biopsy? No  Previous transplant biopsies: Yes  If yes, what were the previous diagnoses?   03/14/2019  Diffuse subacute tubular injury  No evidence for rejection (C4d negative, HLA-DR negative)    Previous kidney transplant/s: Yes If yes, this is #: 2    Donor Information (if available)  Age of donor: 27  Gender: Male  Race: Caucasian  Type of donor: Cadaveric  Cold Ischemia time: 16 hr 31 min  Delayed graft function: No  If yes, how many days on dialysis:     History/Clinical Diagnosis/Indication for Biopsy: Elevated creatinine  Kidney transplant #1 on 11/12/2007  Kidney Transplant #2 on 02/20/2019  Alport's syndrome; Hearing Loss bilateral; HTN, HLD      ----------------------------------------------------------------------------------------------------------------------  Current Baseline Immunosuppression: tacrolimus (Prograf or Envarsus) and mycophenolate (Cellcept or Myfortic) (please select ALL drugs used)  Specific anti-rejection treatment WITHIN 2 WEEKS before biopsy: No  If yes, what was the type of treatment? None  Patient off immunosuppression?: No  Patient seems adherent to immunosuppression? Yes  Patient is currently back on dialysis? No  Has patient had any prior episodes of rejection? No   If yes, what was the treatment? None  ----------------------------------------------------------------------------------------------------------------------  Donor Specific Antibody Results:  Lab Results   Component Value Date    Donor ID [id number] 01/25/2020    Donor HLA-A Antigen #1 A2 01/25/2020    Anti-Donor HLA-A #1 MFI 17 01/25/2020    Donor HLA-A Antigen #2 A68 01/25/2020    Donor HLA-B Antigen #1 B27 01/25/2020    Anti-Donor HLA-B #1 MFI 43 01/25/2020    Donor HLA-B Antigen #2 B45 01/25/2020    Anti-Donor HLA-B #2 MFI 9 01/25/2020    Donor HLA-C Antigen #1 C2 01/25/2020    Anti-Donor HLA-C #1 MFI 0 01/25/2020    Donor HLA-C Antigen #2 C16 01/25/2020    Anti-Donor HLA-C #2 MFI 39 01/25/2020    Donor HLA-DR Antigen #1 DR13 01/25/2020    Anti-Donor HLA-DR #1 MFI 31 01/25/2020    Donor HLA-DR Antigen #2 DR14 01/25/2020    Anti-Donor HLA-DR #2 MFI 31 01/25/2020    Donor DRw Antigen #1 DR52 01/25/2020    Anti-Donor DRw #1 MFI 39 01/25/2020    Donor HLA-DQB Antigen #1 DQ5 01/25/2020    Anti-Donor HLA-DQB #1 MFI 379 01/25/2020    Donor HLA-DQB Antigen #2 DQ5 09/29/2019    Anti-Donor HLA-DQB #2 MFI 344 09/29/2019    DSA Comment  01/25/2020      Comment:      The HLA antigens listed are donor antigens and the corresponding MFI value.  MFI values >=  1000 are considered positive.  A negative result does not exclude the presence of low level DSA.  Blank donor antigen and MFI fields indicate unavailable donor HLA   type or lack of appropriate single antigen bead to determine DSA.  As such, the presence or absence of DSA to the locus cannot be confirmed.     Donor specific antibody (DSA) testing is performed with a  solid phase multiplex single antigen bead array method.  All procedures and reagents have been validated and performance characteristics determined by the [facility].    Certain of these tests have not been cleared/approved by the U.S. Food and Drug Administration (FDA).  The FDA has determined that such approval/clearance is not necessary because this laboratory is certified under the Clinical Laboratory Improvements   Amendments to perform high complexity testing.  This test is used for clinical purposes.  It should not be regarded as investigational or for research.  All HLA typings are performed using molecular methodologies.  HLA-A, B, C, DR, and DQ results are   serological equivalents of the molecular type.           Blood Pressure (mmHg):   BP Readings from Last 3 Encounters:   02/22/20 137/95   01/25/20 135/86   01/25/20 135/86     On Anti-hypertensive Therapy: Yes    Urinalysis:  Lab Results   Component Value Date    Color, UA Yellow 02/22/2020    Color, UA RED 03/16/2014    Specific Gravity, UA 1.010 02/22/2020    Specific Gravity, UA 1.015 03/16/2014    pH, UA 6.0 02/22/2020    pH, UA 9.0 03/16/2014    Glucose, UA Negative 02/22/2020    Glucose, UA NEG 03/16/2014    Ketones, UA Negative 02/22/2020    Ketones, UA NEG 03/16/2014    Blood, UA Negative 02/22/2020    Blood, UA +4 03/16/2014    Nitrite, UA Negative 02/22/2020    Nitrite, UA NEG 03/16/2014    Leukocyte Esterase, UA Negative 02/22/2020    Leukocyte Esterase, UA : 03/16/2014    Urobilinogen, UA 0.2 mg/dL 16/03/9603    Urobilinogen, UA 1 03/16/2014    Bilirubin, UA Negative 02/22/2020    Bilirubin, UA NEG 03/16/2014    WBC, UA >100 (H) 03/16/2014    RBC, UA >100 (H) 03/16/2014     Urine protein/creatinine ratio:   Lab Results   Component Value Date    Protein/Creatinine Ratio, Urine 0.183 02/22/2020    Protein/Creatinine Ratio, Urine 10.681 11/24/2013 Creatinine (present peak): 2.36  Creatinine (baseline): 2.4-3.0  Lab Results   Component Value Date    CREATININE 2.13 (H) 02/22/2020    CREATININE 2.36 (H) 01/25/2020    CREATININE 1.74 (H) 11/17/2019    CREATININE 2.44 (H) 10/20/2019    CREATININE 2.42 (H) 09/29/2019       Glucose:   Lab Results   Component Value Date    Glucose 100 02/22/2020     HGBA1C:   Lab Results   Component Value Date    Hemoglobin A1C 5.4 01/25/2020    Hemoglobin A1C 4.9 02/11/2007     ----------------------------------------------------------------------------------------------------------------------  Clinical signs of infections at time of current biopsy:  BK: No   BK blood viral load:   Lab Results   Component Value Date    BK Blood Result Not Detected 02/22/2020    BK Blood Result Negative 09/22/2012        Urine decoy cells present: No    CMV:  No   CMV viral load:   Lab Results   Component Value Date    CMV Viral Ld Not Detected 02/22/2020    CMV Viral Ld Not Detected 02/12/2011    CMV Quant <50 (H) 10/20/2019    CMV Quant 1468 08/22/2008    CMV Quant Log10  10/20/2019      Comment:      <1.70 log    CMV Comment : 02/12/2011          EBV:No   EBV viral load: No results found for: EBVIU, EBVLOG    HSV: No active disease- HSV 1 IgG positive  Hepatitis B: No  Hepatitis C: No  Bacteria: No  Fungi: No  Urinary tract infection: No  Other:   ----------------------------------------------------------------------------------------------------------------------  Stenosis of renal artery: No  Obstruction of ureter: No  Lymphocele: No

## 2020-02-28 NOTE — Unmapped (Signed)
Pt confirm he has a ride and companion and will be here for the kidney biopsy 9/16.

## 2020-02-29 MED FILL — ENVARSUS XR 4 MG TABLET,EXTENDED RELEASE: 30 days supply | Qty: 120 | Fill #0 | Status: AC

## 2020-02-29 MED FILL — MYFORTIC 180 MG TABLET,DELAYED RELEASE: ORAL | 30 days supply | Qty: 120 | Fill #9

## 2020-02-29 MED FILL — AMLODIPINE 5 MG TABLET: 30 days supply | Qty: 60 | Fill #0 | Status: AC

## 2020-02-29 MED FILL — ENVARSUS XR 1 MG TABLET,EXTENDED RELEASE: ORAL | 30 days supply | Qty: 60 | Fill #4

## 2020-02-29 MED FILL — SODIUM BICARBONATE 650 MG TABLET: ORAL | 30 days supply | Qty: 120 | Fill #9

## 2020-02-29 MED FILL — SODIUM BICARBONATE 650 MG TABLET: 30 days supply | Qty: 120 | Fill #9 | Status: AC

## 2020-02-29 MED FILL — ACETAMINOPHEN 500 MG TABLET: 13 days supply | Qty: 100 | Fill #9 | Status: AC

## 2020-02-29 MED FILL — MYFORTIC 180 MG TABLET,DELAYED RELEASE: 30 days supply | Qty: 120 | Fill #9 | Status: AC

## 2020-02-29 MED FILL — ACETAMINOPHEN 500 MG TABLET: ORAL | 13 days supply | Qty: 100 | Fill #9

## 2020-02-29 MED FILL — ENVARSUS XR 1 MG TABLET,EXTENDED RELEASE: 30 days supply | Qty: 60 | Fill #4 | Status: AC

## 2020-02-29 NOTE — Unmapped (Signed)
Called to verify pts renal biopsy, pt asked to arrive at 0730, pt not currently taking any blood thinners, NPO status reinforced, pt will have responsible party accompany to appointment, COVID screening negative, all questions answered.

## 2020-03-01 NOTE — Unmapped (Signed)
Patient called to reschedule biopsy his ride is unavailable.

## 2020-03-02 ENCOUNTER — Ambulatory Visit: Admit: 2020-03-02 | Discharge: 2020-03-02 | Payer: MEDICARE

## 2020-03-23 ENCOUNTER — Ambulatory Visit: Admit: 2020-03-23 | Discharge: 2020-03-24 | Payer: MEDICARE

## 2020-03-23 ENCOUNTER — Ambulatory Visit: Admit: 2020-03-23 | Discharge: 2020-03-24 | Payer: MEDICARE | Attending: Nephrology | Primary: Nephrology

## 2020-03-23 DIAGNOSIS — E559 Vitamin D deficiency, unspecified: Principal | ICD-10-CM

## 2020-03-23 DIAGNOSIS — D849 Immunodeficiency, unspecified: Principal | ICD-10-CM

## 2020-03-23 DIAGNOSIS — Z940 Kidney transplant status: Principal | ICD-10-CM

## 2020-03-23 DIAGNOSIS — Z79899 Other long term (current) drug therapy: Principal | ICD-10-CM

## 2020-03-23 DIAGNOSIS — E785 Hyperlipidemia, unspecified: Secondary | ICD-10-CM | POA: Diagnosis not present

## 2020-03-23 DIAGNOSIS — H9190 Unspecified hearing loss, unspecified ear: Secondary | ICD-10-CM | POA: Diagnosis not present

## 2020-03-23 DIAGNOSIS — Z713 Dietary counseling and surveillance: Secondary | ICD-10-CM | POA: Diagnosis not present

## 2020-03-23 DIAGNOSIS — E872 Acidosis: Secondary | ICD-10-CM | POA: Diagnosis not present

## 2020-03-23 DIAGNOSIS — E875 Hyperkalemia: Secondary | ICD-10-CM | POA: Diagnosis not present

## 2020-03-23 DIAGNOSIS — Q8781 Alport syndrome: Secondary | ICD-10-CM | POA: Diagnosis not present

## 2020-03-23 DIAGNOSIS — D649 Anemia, unspecified: Secondary | ICD-10-CM | POA: Diagnosis not present

## 2020-03-23 DIAGNOSIS — Z23 Encounter for immunization: Secondary | ICD-10-CM | POA: Diagnosis not present

## 2020-03-23 DIAGNOSIS — I10 Essential (primary) hypertension: Secondary | ICD-10-CM | POA: Diagnosis not present

## 2020-03-23 LAB — CBC W/ AUTO DIFF
BASOPHILS ABSOLUTE COUNT: 0 10*9/L (ref 0.0–0.1)
BASOPHILS RELATIVE PERCENT: 0.9 %
EOSINOPHILS ABSOLUTE COUNT: 0.3 10*9/L (ref 0.0–0.7)
EOSINOPHILS RELATIVE PERCENT: 8.5 %
HEMATOCRIT: 37.9 % — ABNORMAL LOW (ref 38.0–50.0)
HEMOGLOBIN: 12.3 g/dL — ABNORMAL LOW (ref 13.5–17.5)
LYMPHOCYTES ABSOLUTE COUNT: 0.6 10*9/L — ABNORMAL LOW (ref 0.7–4.0)
MEAN CORPUSCULAR HEMOGLOBIN CONC: 32.5 g/dL (ref 30.0–36.0)
MEAN CORPUSCULAR VOLUME: 81 fL (ref 81.0–95.0)
MEAN PLATELET VOLUME: 8.8 fL (ref 7.0–10.0)
MONOCYTES ABSOLUTE COUNT: 0.4 10*9/L (ref 0.1–1.0)
MONOCYTES RELATIVE PERCENT: 13.1 %
NEUTROPHILS ABSOLUTE COUNT: 1.7 10*9/L (ref 1.7–7.7)
NEUTROPHILS RELATIVE PERCENT: 57.1 %
PLATELET COUNT: 235 10*9/L (ref 150–450)
RED BLOOD CELL COUNT: 4.68 10*12/L (ref 4.32–5.72)
RED CELL DISTRIBUTION WIDTH: 16.2 % — ABNORMAL HIGH (ref 12.0–15.0)
WBC ADJUSTED: 3 10*9/L — ABNORMAL LOW (ref 3.5–10.5)

## 2020-03-23 LAB — COMPREHENSIVE METABOLIC PANEL
ALBUMIN: 4.4 g/dL (ref 3.4–5.0)
ALKALINE PHOSPHATASE: 239 U/L — ABNORMAL HIGH (ref 46–116)
ALT (SGPT): 15 U/L (ref 10–49)
ANION GAP: 1 mmol/L — ABNORMAL LOW (ref 5–14)
AST (SGOT): 12 U/L (ref <=34)
BLOOD UREA NITROGEN: 23 mg/dL (ref 9–23)
BUN / CREAT RATIO: 9
CALCIUM: 11.2 mg/dL — ABNORMAL HIGH (ref 8.7–10.4)
CO2: 27.1 mmol/L (ref 20.0–31.0)
CREATININE: 2.48 mg/dL — ABNORMAL HIGH
EGFR CKD-EPI AA MALE: 38 mL/min/{1.73_m2} — ABNORMAL LOW (ref >=60)
EGFR CKD-EPI NON-AA MALE: 33 mL/min/{1.73_m2} — ABNORMAL LOW (ref >=60)
GLUCOSE RANDOM: 111 mg/dL (ref 70–179)
POTASSIUM: 5.2 mmol/L — ABNORMAL HIGH (ref 3.4–4.5)
PROTEIN TOTAL: 7.8 g/dL (ref 5.7–8.2)
SODIUM: 139 mmol/L (ref 135–145)

## 2020-03-23 LAB — URINALYSIS
BACTERIA: NONE SEEN /HPF
BILIRUBIN UA: NEGATIVE
BLOOD UA: NEGATIVE
GLUCOSE UA: NEGATIVE
LEUKOCYTE ESTERASE UA: NEGATIVE
NITRITE UA: NEGATIVE
PH UA: 7 (ref 5.0–9.0)
PROTEIN UA: NEGATIVE
RBC UA: <1 /HPF (ref <3)
SPECIFIC GRAVITY UA: 1.01 (ref 1.005–1.030)
SQUAMOUS EPITHELIAL: <1 /HPF (ref 0–5)
UROBILINOGEN UA: 0.2
WBC UA: <1 /HPF (ref <2)

## 2020-03-23 LAB — PROTEIN URINE: Protein:MCnc:Pt:Urine:Qn:: 20.4

## 2020-03-23 LAB — MAGNESIUM: Magnesium:MCnc:Pt:Ser/Plas:Qn:: 1.8

## 2020-03-23 LAB — RED CELL DISTRIBUTION WIDTH: Lab: 16.2 — ABNORMAL HIGH

## 2020-03-23 LAB — PROTEIN / CREATININE RATIO, URINE
CREATININE, URINE: 216.6 mg/dL
PROTEIN URINE: 20.4 mg/dL

## 2020-03-23 LAB — CREATININE: Creatinine:MCnc:Pt:Ser/Plas:Qn:: 2.48 — ABNORMAL HIGH

## 2020-03-23 LAB — PARATHYROID HORMONE INTACT: Parathyrin.intact:MCnc:Pt:Ser/Plas:Qn:: 328.4 — ABNORMAL HIGH

## 2020-03-23 LAB — KETONES UA

## 2020-03-23 LAB — PHOSPHORUS: Phosphate:MCnc:Pt:Ser/Plas:Qn:: 3

## 2020-03-23 NOTE — Unmapped (Signed)
AOBP    1 bp  134/84   Hr 74  2  bp  129/87   hr 74  3  bp  1204/80 hr 70

## 2020-03-23 NOTE — Unmapped (Signed)
Transplant Nephrology Clinic Visit      History of Present Illness    Russell Wade is a 34 y.o. 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 He does not have evidence of donor specific antibodies as of 09/29/2019.    This is his second kidney transplant, his first was from a deceased donor on 11/26/07 and failed 11/24/2013 secondary to rejection. He underwent transplant nephrectomy 03/20/2014.    This transplant has been complicated by delayed graft function, he required dialysis for low urine output and was discharged from the hospital on dialysis. He was initially on Myfortic 720 mg BID, reduced to 540 mg BID on 03/17/19 for diarrhea. His last dialysis was 03/12/19. He underwent transplant renal biopsy on 03/14/19 that showed diffuse subacute tubular injury with no evidence of rejection. His ureteral stent was removed 03/28/19. Required Kayexalate and Lokelma intermittently for hyperkalemia, Bactrim held. Envarsus levels elevated/labile as well. Required some Aranesp for anemia.     After stopping dialysis, his creatinine slowly trended down into the mid 2s in December, the started to rise again into the 3s and back into the mid 2s in January 2021. For that reason, we recommended biopsy at the end of January. He finally scheduled the biopsy for 07/29/19. A day prior to that biopsy, he had a COVID exposure and the biopsy was rescheduled for 08/08/19. Unfortunately he did not show for that biopsy due to not having a ride. At his visit on 08/25/19 he could not recall when he took his Envarsus the day prior, and we noted that there were a lot of issues getting a reliable trough. We recommended admission for biopsy the next day but he refused due to work and family obligations. He did agree to schedule a biopsy the following week. He was scheduled for biopsy 09/05/19 but again he no showed. He was seen in clinic on 10/20/19 with creatinine of 2.44, has not had labs since that visit.    He saw audiology on 01/10/20 and was shown to have mild bilateral sensorineural hearing loss, unchanged from 2014. They recommended he return for a hearing aid consult on 7/29, do not see that he showed for that, rescheduled for 9/13.    Interval history since last visit 02/22/20    Since last visit, patient did not get his kidney transplant biopsy.  He canceled due to transportation and never rescheduled it.  He went to his Audiology appt on 02/27/20 and is being fitted for a right hearing aid.     Patient is here today for follow up.  He is doing well.  He continues to work 3rd shift. He is complaining of an occasional fine motor tremor.  He states he is having some headaches recently due to stress and a decreased appetite but he states he is eating enough at this time.     He specifically denies fever, myalgias, upper respiratory/GI symptoms, or change in taste/smell.    Last dose of Prograf: 3:05 pm today (after labs)    Review of Systems    Otherwise on review of systems patient denies fever or chills, chest pain, SOB, PND or orthopnea, lower extremity edema. Denies N/V/abdominal pain. No dysuria, hematuria or difficulty voiding. Bowel movements normal. Denies joint pain or rash. All other systems are reviewed and are negative.    Medications    Current Outpatient Medications   Medication Sig Dispense Refill   ??? amLODIPine (NORVASC) 5 MG tablet Take 2  tablets (10 mg total) by mouth daily. 60 tablet 11   ??? carvediloL (COREG) 6.25 MG tablet TAKE 1 TABLET (6.25 MG TOTAL) BY MOUTH TWO (2) TIMES A DAY. 180 tablet 3   ??? mycophenolate (MYFORTIC) 180 MG EC tablet Take 2 tablets (360 mg total) by mouth Two (2) times a day. 120 tablet 11   ??? sodium bicarbonate 650 mg tablet Take 2 tablets (1,300 mg total) by mouth Two (2) times a day. 120 tablet 11   ??? tacrolimus (ENVARSUS XR) 1 mg Tb24 extended release tablet Take 2 tablets (2 mg total) by mouth daily with 4 (4 mg) tablets for a total dose of 18 mg daily 60 tablet 11   ??? tacrolimus (ENVARSUS XR) 4 mg Tb24 extended release tablet Take 4 tablets (16 mg total) by mouth daily with 2 (1mg ) tablets for a total dose of 18 mg daily 120 tablet 3   ??? acetaminophen (TYLENOL) 500 MG tablet Take 1-2 tablets (500-1,000 mg total) by mouth every six (6) hours as needed for pain or fever (> 38C). (Patient not taking: Reported on 02/22/2020) 100 tablet 11   ??? sodium zirconium cyclosilicate (LOKELMA) 5 gram PwPk packet Take 2 packets (10 g total) by mouth two (2) times a day as needed. (Patient not taking: Reported on 11/17/2019)     ??? zolpidem (AMBIEN) 10 mg tablet Take 10 mg by mouth nightly as needed for sleep. (Patient not taking: Reported on 02/22/2020)       No current facility-administered medications for this visit.       Physical Exam    BP 128/81  - Pulse 73  - Ht 175.3 cm (5' 9)  - Wt 72.6 kg (160 lb)  - BMI 23.63 kg/m??   General: Patient is a pleasant male in no apparent distress.  Eyes: Sclera anicteric.  ENT: Mask in place.  Neck: Supple without LAD/JVD/bruits.  Lungs: Clear to auscultation bilaterally, no wheezes/rales/rhonchi.  Cardiovascular: Regular rate and rhythm without murmurs, rubs or gallops.  Abdomen: Soft, notender/nondistended. Positive bowel sounds. No tenderness over the graft.  Extremities: Without edema, joints without evidence of synovitis.  Skin: Without rash.  Neurological: Grossly nonfocal, except for hearing loss.  Psychiatric: Mood and affect appropriate.    Laboratory Results    Lab Results   Component Value Date    WBC 3.0 (L) 03/23/2020    RBC 4.68 03/23/2020    HGB 12.3 (L) 03/23/2020    HCT 37.9 (L) 03/23/2020    MCV 81.0 03/23/2020    MCH 26.4 03/23/2020    MCHC 32.5 03/23/2020    RDW 16.2 (H) 03/23/2020    PLT 235 03/23/2020     Lab Results   Component Value Date    NA 138 02/22/2020    K 5.4 (H) 02/22/2020    CL 111 (H) 02/22/2020    CO2 24.2 02/22/2020    BUN 22 02/22/2020    CREATININE 2.13 (H) 02/22/2020    GLU 100 02/22/2020    CALCIUM 11.1 (H) 02/22/2020    ALBUMIN 4.5 02/22/2020    PHOS 3.0 02/22/2020     Lab Results   Component Value Date    ALKPHOS 244 (H) 02/22/2020    BILITOT 0.4 02/22/2020    BILIDIR 0.50 (H) 03/22/2018    PROT 7.5 02/22/2020    ALBUMIN 4.5 02/22/2020    ALT 10 02/22/2020    AST 14 02/22/2020     Lab Results   Component Value Date  PTH 292.7 (H) 02/22/2020       Assessment and Plan    1. Status post second renal transplant. His creatinine had come down to 1.74 at his visit 11/17/19, which was the lowest it had been since transplant. I was comfortable holding off on biopsy at that time as long as the creatinine continued to fall, but then he did not get any labs following that visit. Today his creatinine is back up to 2.48. We again spent the bulk of the visit discussing that we need labs and accurate trough levels to help him have success with this transplant. His tacrolimus trough of 10.2 is a 24 hour trough and just above goal of 6-8. Will continue current immunosuppression.    We discussed that since his creatinine is back up and that he is high risk for rejection with his sensitization, that I would really like a biopsy to make sure there is no smoldering rejection. His main barrier to biopsy has been arranging transportation. In the past we had discussed admitting him for biopsy, but given the current bed crisis I cannot justify taking a hospital bed at this time and I explained that to him. He apologized for canceling his last biopsy appointment and will inform us when he can get transportation so we can reschedule it.     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 Did see audiology and was found to have stable hearing loss. Being fitted for right hearing aid.     3. Hypertension. Blood pressure well controlled current regimen, 128/81 today.    4. Hyperkalemia. 5.2 today.  He is not taking Lokelma    5. Hypomagnesemia. Magnesium 1.8 today.     6. Hypercalcemia. Calcium 11.2 today.  Will start on Sensipar 30mg .     7. Metabolic acidosis. CO2 at goal (27.1 today) on current supplementation.    8. Hyperlipidemia. Triglycerides have been elevated (381 01/25/20 from 415 11/17/19), did some dietary counseling. May need to treat if remain high.     9. Health maintenance. Received the flu shot today in clinic, has not received pneumococcal vaccines. Received both doses of Covid vaccine.     We discussed that while we continue to recommend the COVID vaccine for all eligible patients, we know that kidney transplant patients do not respond as strongly due to their immunosuppression, so he should continue to follow the CDC guidelines for patients who are not vaccinated. I would also encourage all household members and close contacts that are eligible for vaccination to get vaccinated. We discussed the importance of ongoing social distancing, wearing a mask outside the home, and hand washing/sanitizing.     10. Will see patient back in 4 -6 weeks, or sooner if needed. Hopefully will get biopsy in the meantime.

## 2020-03-24 LAB — TACROLIMUS, TROUGH: Lab: 10.2

## 2020-03-24 LAB — CMV VIRAL LD: Lab: NOT DETECTED

## 2020-03-24 LAB — CMV DNA, QUANTITATIVE, PCR: CMV VIRAL LD: NOT DETECTED

## 2020-03-27 LAB — BK BLOOD LOG(10): Lab: 0

## 2020-03-27 LAB — BK VIRUS QUANTITATIVE PCR, BLOOD

## 2020-03-27 MED ORDER — CINACALCET 30 MG TABLET: 30 mg | tablet | Freq: Every day | 2 refills | 30 days | Status: AC

## 2020-03-27 MED ORDER — CINACALCET 30 MG TABLET
ORAL_TABLET | Freq: Every day | ORAL | 11 refills | 30.00000 days | Status: CP
Start: 2020-03-27 — End: 2021-03-27
  Filled 2020-03-29: qty 30, 30d supply, fill #0

## 2020-03-28 NOTE — Unmapped (Signed)
Pt advised to start Sensipar 30 mg daily.

## 2020-03-28 NOTE — Unmapped (Signed)
[facility] Specialty Medication Onboarding    Specialty Medication: cincacalcet  Prior Authorization: Not Required   Financial Assistance: No - copay  <$25  Final Copay/Day Supply: $1.30 / 30    Insurance Restrictions: None     Notes to Pharmacist: None    The triage team has completed the benefits investigation and has determined that the patient is able to fill this medication at [facility]. Please contact the patient to complete the onboarding or follow up with the prescribing physician as needed.

## 2020-03-29 LAB — VITAMIN D, TOTAL (25OH): Lab: 7.1 — ABNORMAL LOW

## 2020-03-29 MED FILL — CINACALCET 30 MG TABLET: 30 days supply | Qty: 30 | Fill #0 | Status: AC

## 2020-03-29 NOTE — Unmapped (Signed)
This onboarding is for the following medication:  1) Sensipar        [facility]   Patient Onboarding/Medication Counseling    Russell Wade is a 34 y.o. male with a kidney transplant who I am counseling today on initiation of therapy.  I am speaking to the patient.    Was a Nurse, learning disability used for this call? No    Verified patient's date of birth / HIPAA.    Specialty medication(s) to be sent: Transplant: Cinacalcet 30mg   Patient declined needing any other specialty medications today.      Non-specialty medications/supplies to be sent: none      Medications not needed at this time: none         Sensipar (cinacalcet)    Medication & Administration     Dosage: Take one tablet daily    Administration:   ??? Take with food or shortly after a meal.  ??? Take whole, do not chew, crush, or divide tablets    Adherence/Missed dose instructions:  ??? Take a missed dose as soon as you think about it, with food.  ??? If it is close to the time for your next dose, skip the missed dose and go back to your normal time.  ??? Do not take 2 doses at the same time or extra doses.    Goals of Therapy     ??? Normalize serum calcium levels    Side Effects & Monitoring Parameters     ??? Common side effects  ??? Feeling dizzy, tired, or weak  ??? Nausea, vomiting, diarrhea, and stomach pain  ??? Headache  ??? Back pain, muscle spasm/pain  ??? Cough  ??? Signs of a common cold    ??? The following side effects should be reported to the provider:  ??? Allergic reaction  ??? Signs of high blood pressure (very bad headaches or dizziness, passing out, or change in eyesight)  ??? Low calcium levels (muscle cramps or spasms, numbness/tingling, or seizures)  ??? Electrolyte issues (mood changes, confusion, muscle pain or weakness, abnormal heartbeat, lack of appetite, severe nausea/vomiting)  ??? Dehydration (dry skin, dry mouth, dry eyes, increased thirst, fast heartbeat, dizziness, fast breathing, or confusion)  ??? Heart issues (cough or shortness of breath, swelling in ankles or legs, abnormal heartbeat, weight gain > 5lbs in 24 hours, dizziness/passing out  ??? Chest pain  ??? Depression  ??? Joint pain  ??? Bone pain  ??? Severe loss of strength and energy  ??? Signs of stomach or bowel bleeding (black, tarry or bloddy stools, throwing up blood, throw up that looks like coffee grounds, very bad stomach pain, very upset stomach or throwing up)    ??? Monitoring Parameters  ??? Monitor serum calcium and phosphorus levels prior to starting and monthly   ??? Monitor intact parathyroid hormone levels prior to starting and throughout therapy    Contraindications, Warnings, & Precautions     ??? Adynamic bone disease - may develop if intact parathyroid hormone levels are suppressed <100pg/ml  ??? Cardiovascular effects - QT prolongation and ventricular arrhythmia secondary to hypocalcemia may occur  ??? GI effects - GI bleeding  ??? Hypocalcemia    Drug/Food Interactions     ??? Medication list reviewed in Epic. The patient was instructed to inform the care team before taking any new medications or supplements. No drug interactions identified.     Storage, Handling Precautions, & Disposal   ??? Store at room temperature  ??? Keep away from  children and pets        Current Medications (including OTC/herbals), Comorbidities and Allergies     Current Outpatient Medications   Medication Sig Dispense Refill   ??? acetaminophen (TYLENOL) 500 MG tablet Take 1-2 tablets (500-1,000 mg total) by mouth every six (6) hours as needed for pain or fever (> 38C). (Patient not taking: Reported on 02/22/2020) 100 tablet 11   ??? amLODIPine (NORVASC) 5 MG tablet Take 2 tablets (10 mg total) by mouth daily. 60 tablet 11   ??? carvediloL (COREG) 6.25 MG tablet TAKE 1 TABLET (6.25 MG TOTAL) BY MOUTH TWO (2) TIMES A DAY. 180 tablet 3   ??? cinacalcet (SENSIPAR) 30 MG tablet Take 1 tablet (30 mg total) by mouth daily. 30 tablet 11   ??? mycophenolate (MYFORTIC) 180 MG EC tablet Take 2 tablets (360 mg total) by mouth Two (2) times a day. 120 tablet 11   ??? sodium bicarbonate 650 mg tablet Take 2 tablets (1,300 mg total) by mouth Two (2) times a day. 120 tablet 11   ??? sodium zirconium cyclosilicate (LOKELMA) 5 gram PwPk packet Take 2 packets (10 g total) by mouth two (2) times a day as needed. (Patient not taking: Reported on 11/17/2019)     ??? tacrolimus (ENVARSUS XR) 1 mg Tb24 extended release tablet Take 2 tablets (2 mg total) by mouth daily with 4 (4 mg) tablets for a total dose of 18 mg daily 60 tablet 11   ??? tacrolimus (ENVARSUS XR) 4 mg Tb24 extended release tablet Take 4 tablets (16 mg total) by mouth daily with 2 (1mg ) tablets for a total dose of 18 mg daily 120 tablet 3   ??? zolpidem (AMBIEN) 10 mg tablet Take 10 mg by mouth nightly as needed for sleep. (Patient not taking: Reported on 02/22/2020)       No current facility-administered medications for this visit.       Allergies   Allergen Reactions   ??? Tuberculin Ppd      Other reaction(s): Skin Rash       Patient Active Problem List   Diagnosis   ??? Complication of transplanted kidney   ??? Alport syndrome   ??? Esophageal reflux   ??? Essential (primary) hypertension   ??? H/O kidney transplant   ??? Pityriasis versicolor   ??? Acute renal failure (CMS-HCC)   ??? Anemia   ??? Transplant rejection   ??? Hyperkalemia   ??? Kidney transplant rejection   ??? Acute dyspnea   ??? ESRD on dialysis (CMS-HCC)   ??? Secondary hyperparathyroidism of renal origin (CMS-HCC)   ??? Kidney replaced by transplant   ??? Health maintenance examination   ??? Immunosuppression (CMS-HCC)   ??? Other abnormal findings on microbiological examination of urine       Reviewed and up to date in Epic.    Appropriateness of Therapy     Is medication and dose appropriate based on diagnosis? Yes    Prescription has been clinically reviewed: Yes    Baseline Quality of Life Assessment      How many days over the past month did your kidney transplant  keep you from your normal activities? For example, brushing your teeth or getting up in the morning. 0    Financial Information     Medication Assistance provided: None Required    Anticipated copay of $1.30 reviewed with patient. Verified delivery address.    Delivery Information     Scheduled delivery date: 03/30/20    Expected  start date: 03/30/20    Medication will be delivered via UPS to the prescription address in [facility].  This shipment will not require a signature.      Explained the services we provide at [facility] and that each month we would call to set up refills.  Stressed importance of returning phone calls so that we could ensure they receive their medications in time each month.  Informed patient that we should be setting up refills 7-10 days prior to when they will run out of medication.  A pharmacist will reach out to perform a clinical assessment periodically.  Informed patient that a welcome packet and a drug information handout will be sent.      Patient verbalized understanding of the above information as well as how to contact the pharmacy at [phone] option 4 with any questions/concerns.  The pharmacy is open Monday through Friday 8:30am-4:30pm.  A pharmacist is available 24/7 via pager to answer any clinical questions they may have.    Patient Specific Needs     - Does the patient have any physical, cognitive, or cultural barriers? No    - Patient prefers to have medications discussed with  Patient     - Is the patient or caregiver able to read and understand education materials at a high school level or above? Yes    - Patient's primary language is  English     - Is the patient high risk? Yes, patient is taking a REMS drug. Medication is dispensed in compliance with REMS program    - Does the patient require a Care Management Plan? No     - Does the patient require physician intervention or other additional services (i.e. nutrition, smoking cessation, social work)? No      Tera Helper  [facility] Specialty Pharmacist

## 2020-04-05 LAB — DONOR HLA-DQB ANTIGEN #2

## 2020-04-05 LAB — HLA CL1 ANTIBODY COMM: Lab: 0

## 2020-04-05 LAB — HLA DS POST TRANSPLANT
ANTI-DONOR DRW #1 MFI: 23 MFI
ANTI-DONOR DRW #2 MFI: 18 MFI
ANTI-DONOR HLA-A #1 MFI: 22 MFI
ANTI-DONOR HLA-A #2 MFI: 25 MFI
ANTI-DONOR HLA-B #1 MFI: 39 MFI
ANTI-DONOR HLA-C #1 MFI: 0 MFI
ANTI-DONOR HLA-C #2 MFI: 69 MFI
ANTI-DONOR HLA-DP AG #1 MFI: 36 MFI
ANTI-DONOR HLA-DQB #1 MFI: 29 MFI
ANTI-DONOR HLA-DR #1 MFI: 45 MFI
ANTI-DONOR HLA-DR #2 MFI: 38 MFI

## 2020-04-05 LAB — FSAB CLASS 1 ANTIBODY SPECIFICITY: HLA CLASS 1 ANTIBODY RESULT: POSITIVE

## 2020-04-05 LAB — HLA CL2 AB COMMENT: Lab: 0

## 2020-04-05 LAB — FSAB CLASS 2 ANTIBODY SPECIFICITY: HLA CL2 AB RESULT: POSITIVE

## 2020-04-06 DIAGNOSIS — Z940 Kidney transplant status: Principal | ICD-10-CM

## 2020-04-06 MED ORDER — ACETAMINOPHEN 500 MG TABLET
ORAL_TABLET | Freq: Three times a day (TID) | ORAL | 11 refills | 17.00000 days | Status: CP | PRN
Start: 2020-04-06 — End: 2021-04-06
  Filled 2020-04-06: qty 100, 17d supply, fill #0

## 2020-04-06 MED ORDER — MYCOPHENOLATE SODIUM 180 MG TABLET,DELAYED RELEASE: 360 mg | tablet | Freq: Two times a day (BID) | 11 refills | 30 days | Status: AC

## 2020-04-06 MED ORDER — SODIUM BICARBONATE 650 MG TABLET
ORAL_TABLET | Freq: Two times a day (BID) | ORAL | 11 refills | 30.00000 days | Status: CN
Start: 2020-04-06 — End: 2021-04-06
  Filled 2020-04-06: qty 120, 30d supply, fill #0

## 2020-04-06 MED ORDER — MYCOPHENOLATE SODIUM 180 MG TABLET,DELAYED RELEASE
ORAL_TABLET | Freq: Two times a day (BID) | ORAL | 11 refills | 30.00000 days | Status: CN
Start: 2020-04-06 — End: 2021-04-06
  Filled 2020-04-06: qty 120, 30d supply, fill #0

## 2020-04-06 MED FILL — ENVARSUS XR 4 MG TABLET,EXTENDED RELEASE: ORAL | 30 days supply | Qty: 120 | Fill #1

## 2020-04-06 MED FILL — ENVARSUS XR 1 MG TABLET,EXTENDED RELEASE: 30 days supply | Qty: 60 | Fill #5 | Status: AC

## 2020-04-06 MED FILL — ACETAMINOPHEN 500 MG TABLET: 17 days supply | Qty: 100 | Fill #0 | Status: AC

## 2020-04-06 MED FILL — ENVARSUS XR 1 MG TABLET,EXTENDED RELEASE: ORAL | 30 days supply | Qty: 60 | Fill #5

## 2020-04-06 MED FILL — AMLODIPINE 5 MG TABLET: 30 days supply | Qty: 60 | Fill #1 | Status: AC

## 2020-04-06 MED FILL — MYFORTIC 180 MG TABLET,DELAYED RELEASE: 30 days supply | Qty: 120 | Fill #0 | Status: AC

## 2020-04-06 MED FILL — AMLODIPINE 5 MG TABLET: ORAL | 30 days supply | Qty: 60 | Fill #1

## 2020-04-06 MED FILL — ENVARSUS XR 4 MG TABLET,EXTENDED RELEASE: 30 days supply | Qty: 120 | Fill #1 | Status: AC

## 2020-04-06 MED FILL — SODIUM BICARBONATE 650 MG TABLET: 30 days supply | Qty: 120 | Fill #0 | Status: AC

## 2020-04-06 NOTE — Unmapped (Signed)
[facility] Clinical Assessment & Refill Coordination Note    Russell Wade, DOB: Jun 13, 1986  Phone: [phone] (home)     All above HIPAA information was verified with patient.     Was a Nurse, learning disability used for this call? No    Specialty Medication(s):   Transplant: Envarsus 1mg , Envarsus 4mg , Myfortic 180mg  and Cinacalcet 30mg      Current Outpatient Medications   Medication Sig Dispense Refill   ??? acetaminophen (TYLENOL) 500 MG tablet Take 1-2 tablets (500-1,000 mg total) by mouth every six (6) hours as needed for pain or fever (> 38C). (Patient not taking: Reported on 02/22/2020) 100 tablet 11   ??? amLODIPine (NORVASC) 5 MG tablet Take 2 tablets (10 mg total) by mouth daily. 60 tablet 11   ??? carvediloL (COREG) 6.25 MG tablet TAKE 1 TABLET (6.25 MG TOTAL) BY MOUTH TWO (2) TIMES A DAY. 180 tablet 3   ??? cinacalcet (SENSIPAR) 30 MG tablet Take 1 tablet (30 mg total) by mouth daily. 30 tablet 11   ??? mycophenolate (MYFORTIC) 180 MG EC tablet Take 2 tablets (360 mg total) by mouth Two (2) times a day. 120 tablet 11   ??? sodium zirconium cyclosilicate (LOKELMA) 5 gram PwPk packet Take 2 packets (10 g total) by mouth two (2) times a day as needed. (Patient not taking: Reported on 11/17/2019)     ??? tacrolimus (ENVARSUS XR) 1 mg Tb24 extended release tablet Take 2 tablets (2 mg total) by mouth daily with 4 (4 mg) tablets for a total dose of 18 mg daily 60 tablet 11   ??? tacrolimus (ENVARSUS XR) 4 mg Tb24 extended release tablet Take 4 tablets (16 mg total) by mouth daily with 2 (1mg ) tablets for a total dose of 18 mg daily 120 tablet 3   ??? zolpidem (AMBIEN) 10 mg tablet Take 10 mg by mouth nightly as needed for sleep. (Patient not taking: Reported on 02/22/2020)       No current facility-administered medications for this visit.        Changes to medications: Russell Wade reports no changes at this time.    Allergies   Allergen Reactions   ??? Tuberculin Ppd      Other reaction(s): Skin Rash Changes to allergies: No    SPECIALTY MEDICATION ADHERENCE     Envarsus Xr 1 mg: 4 days of medicine on hand   Envarsus Xr 4 mg: 4 days of medicine on hand   Myfortic 180 mg: 4 days of medicine on hand   Cinacalcet 30mg  21 days of medicine on hand    Medication Adherence    Patient reported X missed doses in the last month: 0  Specialty Medication: Envarsus Xr 1mg   Patient is on additional specialty medications: Yes  Additional Specialty Medications: Envarsus Xr 4mg   Patient Reported Additional Medication X Missed Doses in the Last Month: 0  Patient is on more than two specialty medications: Yes  Specialty Medication: Myfortic 180mg   Patient Reported Additional Medication X Missed Doses in the Last Month: 0  Specialty Medication: Cinacalcet 30mg   Patient Reported Additional Medication X Missed Doses in the Last Month: 0          Specialty medication(s) dose(s) confirmed: Regimen is correct and unchanged.     Are there any concerns with adherence? No    Adherence counseling provided? Not needed    CLINICAL MANAGEMENT AND INTERVENTION      Clinical Benefit Assessment:    Do you feel  the medicine is effective or helping your condition? Yes    Clinical Benefit counseling provided? Not needed    Adverse Effects Assessment:    Are you experiencing any side effects? No    Are you experiencing difficulty administering your medicine? No    Quality of Life Assessment:    How many days over the past month did your kidney transplant  keep you from your normal activities? For example, brushing your teeth or getting up in the morning. 0    Have you discussed this with your provider? Not needed    Therapy Appropriateness:    Is therapy appropriate? Yes, therapy is appropriate and should be continued    DISEASE/MEDICATION-SPECIFIC INFORMATION      N/A    PATIENT SPECIFIC NEEDS     - Does the patient have any physical, cognitive, or cultural barriers? No    - Is the patient high risk? Yes, patient is taking a REMS drug. Medication is dispensed in compliance with REMS program    - Does the patient require a Care Management Plan? No     - Does the patient require physician intervention or other additional services (i.e. nutrition, smoking cessation, social work)? No      SHIPPING     Specialty Medication(s) to be Shipped:   Transplant: Envarsus 1mg , Envarsus 4mg  and Myfortic 180mg   Patient declined Cinacalcet today.    Other medication(s) to be shipped: APAP, Amodipine, Sod Bicarb     Changes to insurance: No    Delivery Scheduled: Yes, Expected medication delivery date: 04/09/20.     Medication will be delivered via UPS to the confirmed prescription address in [facility].    The patient will receive a drug information handout for each medication shipped and additional FDA Medication Guides as required.  Verified that patient has previously received a Conservation officer, historic buildings.    All of the patient's questions and concerns have been addressed.    Russell Wade   [facility] Specialty Pharmacist

## 2020-04-19 NOTE — Unmapped (Signed)
[facility] Refill Coordination Note    Specialty Medication(s) to be Shipped:   Transplant: cinacalcet 30mg     Other medication(s) to be shipped: No additional medications requested for fill at this time     Russell Wade, DOB: 1985/07/18  Phone: [phone] (home)       All above HIPAA information was verified with patient.     Was a Nurse, learning disability used for this call? No    Completed refill call assessment today to schedule patient's medication shipment from the [facility] [phone]).       Specialty medication(s) and dose(s) confirmed: Regimen is correct and unchanged.   Changes to medications: Erik reports no changes at this time.  Changes to insurance: No  Questions for the pharmacist: No    Confirmed patient received Welcome Packet with first shipment. The patient will receive a drug information handout for each medication shipped and additional FDA Medication Guides as required.       DISEASE/MEDICATION-SPECIFIC INFORMATION        N/A    SPECIALTY MEDICATION ADHERENCE     Medication Adherence    Patient reported X missed doses in the last month: 0  Specialty Medication: cinacalcet 30mg   Patient is on additional specialty medications: No  Patient is on more than two specialty medications: No  Any gaps in refill history greater than 2 weeks in the last 3 months: no  Demonstrates understanding of importance of adherence: yes  Informant: patient  Reliability of informant: reliable  Provider-estimated medication adherence level: good  Patient is at risk for Non-Adherence: No                cinacalcet 30mg   : 10 days of medicine on hand         SHIPPING     Shipping address confirmed in Epic.     Delivery Scheduled: Yes, Expected medication delivery date: 11/09.     Medication will be delivered via Next Day Courier to the prescription address in Epic WAM.    Antonietta Barcelona   [facility] Specialty Technician

## 2020-04-23 MED FILL — CINACALCET 30 MG TABLET: ORAL | 30 days supply | Qty: 30 | Fill #1

## 2020-04-23 MED FILL — CINACALCET 30 MG TABLET: 30 days supply | Qty: 30 | Fill #1 | Status: AC

## 2020-04-26 NOTE — Unmapped (Signed)
[facility] Refill Coordination Note    Specialty Medication(s) to be Shipped:   Transplant: Envarsus 1mg , Envarsus 4mg  and Myfortic 180mg     Other medication(s) to be shipped: sodium bicarb, carvedilol, tylenol and amlodipine     Russell Wade, DOB: May 27, 1986  Phone: [phone] (home)       All above HIPAA information was verified with patient.     Was a Nurse, learning disability used for this call? No    Completed refill call assessment today to schedule patient's medication shipment from the [facility] [phone]).       Specialty medication(s) and dose(s) confirmed: Regimen is correct and unchanged.   Changes to medications: Russell Wade reports no changes at this time.  Changes to insurance: No  Questions for the pharmacist: No    Confirmed patient received Welcome Packet with first shipment. The patient will receive a drug information handout for each medication shipped and additional FDA Medication Guides as required.       DISEASE/MEDICATION-SPECIFIC INFORMATION        N/A    SPECIALTY MEDICATION ADHERENCE     Medication Adherence    Patient reported X missed doses in the last month: 0  Specialty Medication: Envarsus 1mg   Patient is on additional specialty medications: Yes  Additional Specialty Medications: Envarsus 4mg   Patient Reported Additional Medication X Missed Doses in the Last Month: 0  Patient is on more than two specialty medications: Yes  Specialty Medication: Myfortic 180mg   Patient Reported Additional Medication X Missed Doses in the Last Month: 1        Envarsus 1 mg: 9 days of medicine on hand   Envarsus 4 mg: 9 days of medicine on hand   Myfortic 180 mg: 9 days of medicine on hand     SHIPPING     Shipping address confirmed in Epic.     Delivery Scheduled: Yes, Expected medication delivery date: 05/04/2020.     Medication will be delivered via UPS to the prescription address in Epic WAM.    Lorelei Pont [facility] Specialty Technician

## 2020-05-03 MED FILL — AMLODIPINE 5 MG TABLET: 30 days supply | Qty: 60 | Fill #2 | Status: AC

## 2020-05-03 MED FILL — AMLODIPINE 5 MG TABLET: ORAL | 30 days supply | Qty: 60 | Fill #2

## 2020-05-03 MED FILL — CARVEDILOL 6.25 MG TABLET: ORAL | 90 days supply | Qty: 180 | Fill #0

## 2020-05-03 MED FILL — ENVARSUS XR 4 MG TABLET,EXTENDED RELEASE: ORAL | 30 days supply | Qty: 120 | Fill #2

## 2020-05-03 MED FILL — SODIUM BICARBONATE 650 MG TABLET: 30 days supply | Qty: 120 | Fill #1 | Status: AC

## 2020-05-03 MED FILL — SODIUM BICARBONATE 650 MG TABLET: ORAL | 30 days supply | Qty: 120 | Fill #1

## 2020-05-03 MED FILL — ACETAMINOPHEN 500 MG TABLET: ORAL | 17 days supply | Qty: 100 | Fill #1

## 2020-05-03 MED FILL — ACETAMINOPHEN 500 MG TABLET: 17 days supply | Qty: 100 | Fill #1 | Status: AC

## 2020-05-03 MED FILL — ENVARSUS XR 1 MG TABLET,EXTENDED RELEASE: ORAL | 30 days supply | Qty: 60 | Fill #6

## 2020-05-03 MED FILL — CARVEDILOL 6.25 MG TABLET: 90 days supply | Qty: 180 | Fill #0 | Status: AC

## 2020-05-03 MED FILL — MYFORTIC 180 MG TABLET,DELAYED RELEASE: 30 days supply | Qty: 120 | Fill #1 | Status: AC

## 2020-05-03 MED FILL — MYFORTIC 180 MG TABLET,DELAYED RELEASE: ORAL | 30 days supply | Qty: 120 | Fill #1

## 2020-05-03 MED FILL — ENVARSUS XR 4 MG TABLET,EXTENDED RELEASE: 30 days supply | Qty: 120 | Fill #2 | Status: AC

## 2020-05-03 MED FILL — ENVARSUS XR 1 MG TABLET,EXTENDED RELEASE: 30 days supply | Qty: 60 | Fill #6 | Status: AC

## 2020-05-03 NOTE — Unmapped (Deleted)
Transplant Nephrology Clinic Visit      History of Present Illness    Russell Wade is a 34 y.o. 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 He does not have evidence of donor specific antibodies as of 03/23/2020. Most recent baseline creatinine has been 1.7-2.5.    This is his second kidney transplant, his first was from a deceased donor on 11-18-2007 and failed 11/24/2013 secondary to rejection. He underwent transplant nephrectomy 03/20/2014.    This transplant has been complicated by delayed graft function, he required dialysis for low urine output and was discharged from the hospital on dialysis. He was initially on Myfortic 720 mg BID, reduced to 540 mg BID on 03/17/19 for diarrhea. His last dialysis was 03/12/19. He underwent transplant renal biopsy on 03/14/19 that showed diffuse subacute tubular injury with no evidence of rejection. His ureteral stent was removed 03/28/19. Required Kayexalate and Lokelma intermittently for hyperkalemia, Bactrim held. Envarsus levels elevated/labile as well. Required some Aranesp for anemia.     After stopping dialysis, his creatinine slowly trended down into the mid 2s in December, the started to rise again into the 3s and back into the mid 2s in January 2021. For that reason, we recommended biopsy at the end of January. He finally scheduled the biopsy for 07/29/19. A day prior to that biopsy, he had a COVID exposure and the biopsy was rescheduled for 08/08/19. Unfortunately he did not show for that biopsy due to not having a ride. At his visit on 08/25/19 he could not recall when he took his Envarsus the day prior, and we noted that there were a lot of issues getting a reliable trough. We recommended admission for biopsy the next day but he refused due to work and family obligations. He did agree to schedule a biopsy the following week. He was scheduled for biopsy 09/05/19 but again he no showed. He was seen in clinic on 10/20/19 with creatinine of 2.44, has not had labs since that visit.    He saw audiology on 01/10/20 and was shown to have mild bilateral sensorineural hearing loss, unchanged from 2014. He was seen 9/13 to be fitted for a hearing aid.    The morning of his renal biopsy scheduled for 03/01/20, he cancelled stating that he could not get a ride. He did not reschedule.    Interval history since last visit 03/23/20    At his last visit, he was noted to have a persistently elevated creatinine and was started on Sensipar 30 mg daily. Creatinine was 2.4 at that visit, again recommended renal biopsy.    He presents today for follow up. He has not had any labs since his last visit, nor has he rescheduled his renal biopsy.        He specifically denies fever, myalgias, upper respiratory/GI symptoms, or change in taste/smell.    Last dose of Prograf: *** pm     Review of Systems    Otherwise on review of systems patient denies fever or chills, chest pain, SOB, PND or orthopnea, lower extremity edema. Denies N/V/abdominal pain. No dysuria, hematuria or difficulty voiding. Bowel movements normal. Denies joint pain or rash. All other systems are reviewed and are negative.    Medications    Current Outpatient Medications   Medication Sig Dispense Refill   ??? acetaminophen (TYLENOL) 500 MG tablet Take 1-2 tablets (500-1,000 mg total) by mouth every eight (8) hours as needed for pain or fever (>  38C). Max dose 3000 mg per day 100 tablet 11   ??? amLODIPine (NORVASC) 5 MG tablet Take 2 tablets (10 mg total) by mouth daily. 60 tablet 11   ??? carvediloL (COREG) 6.25 MG tablet TAKE 1 TABLET (6.25 MG TOTAL) BY MOUTH TWO (2) TIMES A DAY. 180 tablet 3   ??? cinacalcet (SENSIPAR) 30 MG tablet Take 1 tablet (30 mg total) by mouth daily. 30 tablet 11   ??? mycophenolate (MYFORTIC) 180 MG EC tablet Take 2 tablets (360 mg total) by mouth Two (2) times a day. 120 tablet 11   ??? sodium bicarbonate 650 mg tablet Take 2 tablets (1,300 mg total) by mouth Two (2) times a day. 120 tablet 11   ??? sodium zirconium cyclosilicate (LOKELMA) 5 gram PwPk packet Take 2 packets (10 g total) by mouth two (2) times a day as needed. (Patient not taking: Reported on 11/17/2019)     ??? tacrolimus (ENVARSUS XR) 1 mg Tb24 extended release tablet Take 2 tablets (2 mg total) by mouth daily with 4 (4 mg) tablets for a total dose of 18 mg daily 60 tablet 11   ??? tacrolimus (ENVARSUS XR) 4 mg Tb24 extended release tablet Take 4 tablets (16 mg total) by mouth daily with 2 (1mg ) tablets for a total dose of 18 mg daily 120 tablet 3   ??? zolpidem (AMBIEN) 10 mg tablet Take 10 mg by mouth nightly as needed for sleep. (Patient not taking: Reported on 02/22/2020)       No current facility-administered medications for this visit.       Physical Exam    There were no vitals taken for this visit.  General: Patient is a pleasant male in no apparent distress.  Eyes: Sclera anicteric.  ENT: Mask in place.  Neck: Supple without LAD/JVD/bruits.  Lungs: Clear to auscultation bilaterally, no wheezes/rales/rhonchi.  Cardiovascular: Regular rate and rhythm without murmurs, rubs or gallops.  Abdomen: Soft, notender/nondistended. Positive bowel sounds. No tenderness over the graft.  Extremities: Without edema, joints without evidence of synovitis.  Skin: Without rash.  Neurological: Grossly nonfocal, except for hearing loss.  Psychiatric: Mood and affect appropriate.    Laboratory Results    No results found for this or any previous visit (from the past 170 hour(s)).    Assessment and Plan    1. Status post second renal transplant. His creatinine had come down to 1.74 at his visit 11/17/19, which was the lowest it had been since transplant. I was comfortable holding off on biopsy at that time as long as the creatinine continued to fall, but then he did not get any labs following that visit. Today his creatinine is ***. We again spent the bulk of the visit discussing that we need labs and accurate trough levels to help him have success with this transplant. His tacrolimus trough of *** is a *** hour trough and at goal of 6-8. Will continue current immunosuppression.    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 Did see audiology and was found to have stable hearing loss. ***Being fitted for right hearing aid.     3. Hypertension. Blood pressure well controlled current regimen, *** today.    4. Hyperkalemia. K is *** today. He is not taking Lokelma    5. Hypomagnesemia. Magnesium *** today.     6. Hypercalcemia/secondary hyperparathyroidism. Calcium *** today on Sensipar 30mg .     7. Metabolic acidosis. CO2 *** on current supplementation.    8. Hyperlipidemia. Triglycerides have been  elevated (381 01/25/20 from 415 11/17/19), did some dietary counseling. May need to treat if remain high.     9. Health maintenance. Received the flu shot 03/23/20, has not received pneumococcal vaccines. Received both doses of Covid vaccine. ***booster?    We discussed that while we continue to recommend the COVID vaccine for all eligible patients, we know that kidney transplant patients do not respond as strongly due to their immunosuppression, so he should continue to follow the CDC guidelines for patients who are not vaccinated. I would also encourage all household members and close contacts that are eligible for vaccination to get vaccinated. We discussed the importance of ongoing social distancing, wearing a mask outside the home, and hand washing/sanitizing.     10. Will see patient back in *** weeks, or sooner if needed.

## 2020-05-21 NOTE — Unmapped (Signed)
The [facility] has made a second and final attempt to reach this patient to refill the following medication: all meds.      We have left voicemails on the following phone numbers: [phone] and have been unable to leave messages on the following phone numbers: [phone].    Dates contacted: 12/01 & 12/06  Last scheduled delivery: 11/09 & 11/19    The patient may be at risk of non-compliance with this medication. The patient should call the [facility] at [phone] (option 4) to refill medication.    Oretha Milch   [facility] Specialty Technician

## 2020-05-25 NOTE — Unmapped (Signed)
The [facility] has made a third and final attempt to reach this patient to refill the following medication:Cinacalcet.      We have been unable to leave messages on the following phone numbers: [phone] (m), [phone](h) and have sent a MyChart message.    Dates contacted: 12/1, 12/6, 12/10  Last scheduled delivery: 04/24/20    The patient may be at risk of non-compliance with this medication. The patient should call the [facility] at [phone] (option 4) to refill medication.    Tera Helper   [facility] Specialty Pharmacist

## 2020-06-11 MED FILL — ENVARSUS XR 1 MG TABLET,EXTENDED RELEASE: ORAL | 30 days supply | Qty: 60 | Fill #7

## 2020-06-11 MED FILL — MYFORTIC 180 MG TABLET,DELAYED RELEASE: 30 days supply | Qty: 120 | Fill #2 | Status: AC

## 2020-06-11 MED FILL — ACETAMINOPHEN 500 MG TABLET: ORAL | 17 days supply | Qty: 100 | Fill #2

## 2020-06-11 MED FILL — ACETAMINOPHEN 500 MG TABLET: 17 days supply | Qty: 100 | Fill #2 | Status: AC

## 2020-06-11 MED FILL — ENVARSUS XR 1 MG TABLET,EXTENDED RELEASE: 30 days supply | Qty: 60 | Fill #7 | Status: AC

## 2020-06-11 MED FILL — AMLODIPINE 5 MG TABLET: 30 days supply | Qty: 60 | Fill #3 | Status: AC

## 2020-06-11 MED FILL — ENVARSUS XR 4 MG TABLET,EXTENDED RELEASE: ORAL | 30 days supply | Qty: 120 | Fill #3

## 2020-06-11 MED FILL — MYFORTIC 180 MG TABLET,DELAYED RELEASE: ORAL | 30 days supply | Qty: 120 | Fill #2

## 2020-06-11 MED FILL — SODIUM BICARBONATE 650 MG TABLET: ORAL | 30 days supply | Qty: 120 | Fill #2

## 2020-06-11 MED FILL — CINACALCET 30 MG TABLET: ORAL | 30 days supply | Qty: 30 | Fill #2

## 2020-06-11 MED FILL — SODIUM BICARBONATE 650 MG TABLET: 30 days supply | Qty: 120 | Fill #2 | Status: AC

## 2020-06-11 MED FILL — CINACALCET 30 MG TABLET: 30 days supply | Qty: 30 | Fill #2 | Status: AC

## 2020-06-11 MED FILL — AMLODIPINE 5 MG TABLET: ORAL | 30 days supply | Qty: 60 | Fill #3

## 2020-06-11 MED FILL — ENVARSUS XR 4 MG TABLET,EXTENDED RELEASE: 30 days supply | Qty: 120 | Fill #3 | Status: AC

## 2020-06-11 NOTE — Unmapped (Signed)
[facility] Refill Coordination Note    Specialty Medication(s) to be Shipped:   Transplant: Envarsus 1mg , Envarsus 4mg , Myfortic 180mg  and Cinacalcet 30mg     Other medication(s) to be shipped: sodium bicarb, amdlodipine and tylenol     Russell Wade, DOB: 05-Aug-1985  Phone: [phone] (home)       All above HIPAA information was verified with patient.     Was a Nurse, learning disability used for this call? No    Completed refill call assessment today to schedule patient's medication shipment from the [facility] [phone]).       Specialty medication(s) and dose(s) confirmed: Regimen is correct and unchanged.   Changes to medications: Dorsie reports no changes at this time.  Changes to insurance: No  Questions for the pharmacist: No    Confirmed patient received Welcome Packet with first shipment. The patient will receive a drug information handout for each medication shipped and additional FDA Medication Guides as required.       DISEASE/MEDICATION-SPECIFIC INFORMATION        N/A    SPECIALTY MEDICATION ADHERENCE     Medication Adherence    Patient reported X missed doses in the last month: 2  Specialty Medication: Envarsus 1mg   Patient is on additional specialty medications: Yes  Additional Specialty Medications: Envarsus 4mg   Patient Reported Additional Medication X Missed Doses in the Last Month: 2  Patient is on more than two specialty medications: Yes  Specialty Medication: Myfortic 180mg   Patient Reported Additional Medication X Missed Doses in the Last Month: 2  Specialty Medication: Cinacalcet 30mg   Patient Reported Additional Medication X Missed Doses in the Last Month: 2        Envarsus 1 mg: 0 days of medicine on hand   Envrasus 4 mg: 0 days of medicine on hand   Myfortic 180 mg: 0 days of medicine on hand   Cinaclcet 30 mg: 0 days of medicine on hand     SHIPPING     Shipping address confirmed in Epic.     Delivery Scheduled: Yes, Expected medication delivery date: 06/12/2020.     Medication will be delivered via UPS to the prescription address in Epic WAM.    Lorelei Pont [facility] Specialty Technician

## 2020-07-05 DIAGNOSIS — Z940 Kidney transplant status: Principal | ICD-10-CM

## 2020-07-05 DIAGNOSIS — D849 Immunodeficiency, unspecified: Principal | ICD-10-CM

## 2020-07-05 MED ORDER — ENVARSUS XR 4 MG TABLET,EXTENDED RELEASE
ORAL_TABLET | Freq: Every day | ORAL | 3 refills | 30 days
Start: 2020-07-05 — End: ?

## 2020-07-06 ENCOUNTER — Telehealth: Admit: 2020-07-06 | Discharge: 2020-07-07 | Payer: MEDICARE | Attending: Nephrology | Primary: Nephrology

## 2020-07-06 DIAGNOSIS — I10 Essential (primary) hypertension: Principal | ICD-10-CM

## 2020-07-06 DIAGNOSIS — Q8781 Alport syndrome: Principal | ICD-10-CM

## 2020-07-06 DIAGNOSIS — D849 Immunodeficiency, unspecified: Principal | ICD-10-CM

## 2020-07-06 DIAGNOSIS — Z940 Kidney transplant status: Principal | ICD-10-CM

## 2020-07-06 DIAGNOSIS — E875 Hyperkalemia: Principal | ICD-10-CM

## 2020-07-06 MED ORDER — TACROLIMUS XR 4 MG TABLET,EXTENDED RELEASE 24 HR
ORAL_TABLET | Freq: Every day | ORAL | 3 refills | 30 days | Status: CP
Start: 2020-07-06 — End: ?
  Filled 2020-07-09: qty 120, 30d supply, fill #0

## 2020-07-06 NOTE — Unmapped (Signed)
Transplant Nephrology Telemedicine Visit      History of Present Illness    Russell Wade is a 35 y.o. 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 He does not have evidence of donor specific antibodies as of 03/23/20. His most recent baseline creatinine has been 1.7-2.4, but he does not get regular labs and has not shown/cancelled scheduled biopsies.    This is his second kidney transplant, his first was from a deceased donor on 12/10/2007 and failed 11/24/2013 secondary to rejection. He underwent transplant nephrectomy 03/20/2014.    This transplant has been complicated by delayed graft function, he required dialysis for low urine output and was discharged from the hospital on dialysis. He was initially on Myfortic 720 mg BID, reduced to 540 mg BID on 03/17/19 for diarrhea. His last dialysis was 03/12/19. He underwent transplant renal biopsy on 03/14/19 that showed diffuse subacute tubular injury with no evidence of rejection. His ureteral stent was removed 03/28/19. Required Kayexalate and Lokelma intermittently for hyperkalemia, Bactrim held. Envarsus levels elevated/labile as well. Required some Aranesp for anemia.     After stopping dialysis, his creatinine slowly trended down into the mid 2s in December, the started to rise again into the 3s and back into the mid 2s in January 2021. For that reason, we recommended biopsy at the end of January. He finally scheduled the biopsy for 07/29/19. A day prior to that biopsy, he had a COVID exposure and the biopsy was rescheduled for 08/08/19. Unfortunately he did not show for that biopsy due to not having a ride. At his visit on 08/25/19 he could not recall when he took his Envarsus the day prior, and we noted that there were a lot of issues getting a reliable trough. We recommended admission for biopsy the next day but he refused due to work and family obligations. He did agree to schedule a biopsy the following week. He was scheduled for biopsy 09/05/19 but again he no showed. He was seen in clinic on 10/20/19 with creatinine of 2.44, has not had labs since that visit.    He saw audiology on 01/10/20 and was shown to have mild bilateral sensorineural hearing loss, unchanged from 2014. They recommended he return for a hearing aid consult on 7/29, do not see that he showed for that, rescheduled for 9/13, did attend that and was being fitted for a hearing aid.    After his visit 02/22/20, patient did not get his kidney transplant biopsy. He canceled due to transportation and never rescheduled it.     Interval history since last visit 03/23/20    His visit today is conducted via video due to the ongoing COVID-19 pandemic. I was located in the [place] clinic building (on campus) during this visit.    Patient presents today for follow up. He has not had labs since 03/23/20. Was a no show for visit 05/04/20.    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 He took today off for the in-person visit, and had intended to still get labs, but he got called into work to cover for someone else.    He notes some sinus symptoms that began 2-3 weeks ago. Notes some yellow mucus and minimally productive cough. No fever or SOB. Did have some coworkers with COVID. Did home test last week that was negative. Had a PCR test at [facility]  department, did not get result but assumes was negative as they did not get in touch with him. Symptoms are improving.    He has not yet had a third dose of COVID vaccine, was planning to do so in clinic today.    He specifically denies fever, myalgias, upper respiratory/GI symptoms, or change in taste/smell.    Review of Systems    Otherwise on review of systems patient denies fever or chills, chest pain, SOB, PND or orthopnea, lower extremity edema. Denies N/V/abdominal pain. No dysuria, hematuria or difficulty voiding. Bowel movements normal. Denies joint pain or rash. All other systems are reviewed and are negative.    Medications    Current Outpatient Medications   Medication Sig Dispense Refill   ??? acetaminophen (TYLENOL) 500 MG tablet Take 1-2 tablets (500-1,000 mg total) by mouth every eight (8) hours as needed for pain or fever (> 38C). Max dose 3000 mg per day 100 tablet 11   ??? amLODIPine (NORVASC) 5 MG tablet Take 2 tablets (10 mg total) by mouth daily. 60 tablet 11   ??? carvediloL (COREG) 6.25 MG tablet TAKE 1 TABLET (6.25 MG TOTAL) BY MOUTH TWO (2) TIMES A DAY. 180 tablet 3   ??? cinacalcet (SENSIPAR) 30 MG tablet Take 1 tablet (30 mg total) by mouth daily. 30 tablet 11   ??? mycophenolate (MYFORTIC) 180 MG EC tablet Take 2 tablets (360 mg total) by mouth Two (2) times a day. 120 tablet 11   ??? sodium bicarbonate 650 mg tablet Take 2 tablets (1,300 mg total) by mouth Two (2) times a day. 120 tablet 11   ??? sodium zirconium cyclosilicate (LOKELMA) 5 gram PwPk packet Take 2 packets (10 g total) by mouth two (2) times a day as needed. (Patient not taking: Reported on 11/17/2019)     ??? tacrolimus (ENVARSUS XR) 1 mg Tb24 extended release tablet Take 2 tablets (2 mg total) by mouth daily with 4 (4 mg) tablets for a total dose of 18 mg daily 60 tablet 11   ??? tacrolimus (ENVARSUS XR) 4 mg Tb24 extended release tablet Take 4 tablets (16 mg total) by mouth daily with 2 (1mg ) tablets for a total dose of 18 mg daily 120 tablet 3   ??? zolpidem (AMBIEN) 10 mg tablet Take 10 mg by mouth nightly as needed for sleep. (Patient not taking: Reported on 02/22/2020)       No current facility-administered medications for this visit.       Physical Exam    Constitutional: Alert and oriented. Well appearing and in no distress.  Eyes: Conjunctivae are normal.  ENT       Head: Normocephalic and atraumatic.       Nose: No rhinorrhea.       Mouth/Throat: Mucous membranes appear moist.       Neck: No audible stridor.  Respiratory: Normal respiratory pattern and effort. No audible wheezing or other abnormal breath sounds. Speaking easily in full sentences. No cough noted during my evaluation of the patient.  Gastrointestinal: Deferred.  Musculoskeletal: Moving all extremities normally.  Neurologic: Normal speech and language. No gross focal neurologic deficits are appreciated.  Skin: Skin appears warm and dry. No rash visible.  Psychiatric: Mood and affect are normal. Speech and behavior are normal.      Laboratory Results    Lab Results   Component Value Date    WBC 3.0 (L) 03/23/2020    RBC 4.68 03/23/2020    HGB 12.3 (L) 03/23/2020  HCT 37.9 (L) 03/23/2020    MCV 81.0 03/23/2020    MCH 26.4 03/23/2020    MCHC 32.5 03/23/2020    RDW 16.2 (H) 03/23/2020    PLT 235 03/23/2020     Lab Results   Component Value Date    NA 139 03/23/2020    K 5.2 (H) 03/23/2020    CL 111 (H) 03/23/2020    CO2 27.1 03/23/2020    BUN 23 03/23/2020    CREATININE 2.48 (H) 03/23/2020    GLU 111 03/23/2020    CALCIUM 11.2 (H) 03/23/2020    ALBUMIN 4.4 03/23/2020    PHOS 3.0 03/23/2020     Lab Results   Component Value Date    ALKPHOS 239 (H) 03/23/2020    BILITOT 0.4 03/23/2020    BILIDIR 0.50 (H) 03/22/2018    PROT 7.8 03/23/2020    ALBUMIN 4.4 03/23/2020    ALT 15 03/23/2020    AST 12 03/23/2020     Lab Results   Component Value Date    PTH 328.4 (H) 03/23/2020       Assessment and Plan    1. Status post second renal transplant. His creatinine had come down to 1.74 at his visit 11/17/19, which was the lowest it had been since transplant. I was comfortable holding off on biopsy at that time as long as the creatinine continued to fall, but then he did not get any labs following that visit. At his visit 03/23/20 it was back up to 2.48. He was to repeat his labs, he has not done so. I am not sure that there is anything that I can say that will improve his adherence to lab draws. He has cancelled or no-showed to biopsies, to the point that I am not going to schedule him for another (even though I think he needs it) until he gives [place] a date himself. I don't want to waste biopsy spots for other patients that may need them when he is extremely unlikely to follow through. He states that he has every other Friday off and will get labs the next Friday.     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 Did see audiology and was found to have stable hearing loss. Recommended being fitted for right hearing aid.     3. Hypertension. Does not measure blood pressure at home.    4. Hyperkalemia. No recent labs.    5. Hypomagnesemia. No recent labs.    6. Hypercalcemia. Calcium 11.2 at last visit, started Sensipar 30mg . No recent labs.    7. Metabolic acidosis. No recent labs.    8. Hyperlipidemia. Triglycerides have been elevated (381 01/25/20 from 415 11/17/19), did some dietary counseling. May need to treat if remain high. No recent labs.    9. Health maintenance. Received the flu shot 03/23/20, has not received pneumococcal vaccines. Received 2 doses of Covid vaccine, encouraged to get third ASAP. Will check spike IgG Ab to see if he qualifies for Evusheld.     We discussed that while we continue to recommend the COVID vaccine for all eligible patients, we know that kidney transplant patients do not respond as strongly due to their immunosuppression, so he should continue to follow the CDC guidelines for patients who are not vaccinated. I would also encourage all household members and close contacts that are eligible for vaccination to get vaccinated. We discussed the importance of ongoing social distancing, wearing a mask outside the home, and hand washing/sanitizing.  10. Will see patient back in 6 weeks, or sooner if needed. Hopefully will get labs in the meantime.

## 2020-07-06 NOTE — Unmapped (Signed)
[facility] Refill Coordination Note    Specialty Medication(s) to be Shipped:   Transplant: Envarsus 1mg , Envarsus 4mg , Myfortic 180mg  and Sensipar 30mg     Other medication(s) to be shipped:      Tylenol  Amlodipine  Sodium bicarb       Russell Wade, DOB: 31-Mar-1986  Phone: [phone] (home)       All above HIPAA information was verified with patient.     Was a Nurse, learning disability used for this call? No    Completed refill call assessment today to schedule patient's medication shipment from the [facility] [phone]).       Specialty medication(s) and dose(s) confirmed: Regimen is correct and unchanged.   Changes to medications: Montrelle reports no changes at this time.  Changes to insurance: No  Questions for the pharmacist: No    Confirmed patient received Welcome Packet with first shipment. The patient will receive a drug information handout for each medication shipped and additional FDA Medication Guides as required.       DISEASE/MEDICATION-SPECIFIC INFORMATION        N/A    SPECIALTY MEDICATION ADHERENCE     Medication Adherence    Patient reported X missed doses in the last month: 0               Envarsus 1 mg: 5 days of medicine on hand   Envrasus 4 mg: 5 days of medicine on hand   Myfortic 180 mg: 5 days of medicine on hand   Cinaclcet 30 mg: 5 days of medicine on hand         SHIPPING     Shipping address confirmed in Epic.     Delivery Scheduled: Yes, Expected medication delivery date: 07/10/20.     Medication will be delivered via UPS to the prescription address in Epic WAM.    Russell Wade   [facility] Specialty Technician

## 2020-07-09 MED FILL — ENVARSUS XR 1 MG TABLET,EXTENDED RELEASE: ORAL | 30 days supply | Qty: 60 | Fill #8

## 2020-07-09 MED FILL — SODIUM BICARBONATE 650 MG TABLET: ORAL | 30 days supply | Qty: 120 | Fill #3

## 2020-07-09 MED FILL — ACETAMINOPHEN 500 MG TABLET: ORAL | 17 days supply | Qty: 100 | Fill #3

## 2020-07-09 MED FILL — MYFORTIC 180 MG TABLET,DELAYED RELEASE: ORAL | 30 days supply | Qty: 120 | Fill #3

## 2020-07-09 MED FILL — CINACALCET 30 MG TABLET: ORAL | 30 days supply | Qty: 30 | Fill #3

## 2020-07-09 MED FILL — AMLODIPINE 5 MG TABLET: ORAL | 30 days supply | Qty: 60 | Fill #4

## 2020-07-30 MED FILL — CARVEDILOL 6.25 MG TABLET: ORAL | 90 days supply | Qty: 180 | Fill #1

## 2020-07-30 NOTE — Unmapped (Signed)
[facility] Refill Coordination Note    Specialty Medication(s) to be Shipped:   Transplant: Envarsus 1mg , Envarsus 4mg , Myfortic 180mg  and Cinacalcet 30mg     Other medication(s) to be shipped: carvedilol, sodium bicarb, amdlodipine and tylenol     Russell Wade, DOB: [birth year]  Phone: [phone] (home)       All above HIPAA information was verified with patient.     Was a Nurse, learning disability used for this call? No    Completed refill call assessment today to schedule patient's medication shipment from the [facility] [phone]).       Specialty medication(s) and dose(s) confirmed: Regimen is correct and unchanged.   Changes to medications: Merick reports no changes at this time.  Changes to insurance: No  Questions for the pharmacist: No    Confirmed patient received Welcome Packet with first shipment. The patient will receive a drug information handout for each medication shipped and additional FDA Medication Guides as required.       DISEASE/MEDICATION-SPECIFIC INFORMATION        N/A    SPECIALTY MEDICATION ADHERENCE     Medication Adherence    Patient reported X missed doses in the last month: 0  Specialty Medication: Cinacalcet 30mg   Patient is on additional specialty medications: Yes  Additional Specialty Medications: Envarsus 1mg   Patient Reported Additional Medication X Missed Doses in the Last Month: 0  Patient is on more than two specialty medications: Yes  Specialty Medication: Envarsus 4mg   Patient Reported Additional Medication X Missed Doses in the Last Month: 0  Specialty Medication: Myfortic 180mg   Patient Reported Additional Medication X Missed Doses in the Last Month: 2        Cinacalcet 30 mg: 10 days of medicine on hand   Envarsus 1 mg: 10 days of medicine on hand   Envarsus 4 mg: 10 days of medicine on hand   Myfortic 180 mg: 10 days of medicine on hand     SHIPPING     Shipping address confirmed in Epic.     Delivery Scheduled: Yes, Expected medication delivery date: 08/07/2020.     Medication will be delivered via UPS to the prescription address in Epic WAM.    [facility] Specialty Technician

## 2020-08-06 MED FILL — ENVARSUS XR 1 MG TABLET,EXTENDED RELEASE: ORAL | 30 days supply | Qty: 60 | Fill #9

## 2020-08-06 MED FILL — MYFORTIC 180 MG TABLET,DELAYED RELEASE: ORAL | 30 days supply | Qty: 120 | Fill #4

## 2020-08-06 MED FILL — ACETAMINOPHEN 500 MG TABLET: ORAL | 17 days supply | Qty: 100 | Fill #4

## 2020-08-06 MED FILL — AMLODIPINE 5 MG TABLET: ORAL | 30 days supply | Qty: 60 | Fill #5

## 2020-08-06 MED FILL — ENVARSUS XR 4 MG TABLET,EXTENDED RELEASE: ORAL | 30 days supply | Qty: 120 | Fill #1

## 2020-08-06 MED FILL — CINACALCET 30 MG TABLET: ORAL | 30 days supply | Qty: 30 | Fill #4

## 2020-08-06 MED FILL — SODIUM BICARBONATE 650 MG TABLET: ORAL | 30 days supply | Qty: 120 | Fill #4

## 2020-08-09 ENCOUNTER — Ambulatory Visit: Admit: 2020-08-09 | Discharge: 2020-08-10 | Payer: MEDICARE

## 2020-08-09 DIAGNOSIS — Z940 Kidney transplant status: Principal | ICD-10-CM

## 2020-08-09 DIAGNOSIS — Z79899 Other long term (current) drug therapy: Principal | ICD-10-CM

## 2020-08-09 DIAGNOSIS — E559 Vitamin D deficiency, unspecified: Principal | ICD-10-CM

## 2020-08-09 LAB — CBC W/ AUTO DIFF
BASOPHILS ABSOLUTE COUNT: 0 10*9/L (ref 0.0–0.1)
BASOPHILS RELATIVE PERCENT: 0.7 %
EOSINOPHILS ABSOLUTE COUNT: 0.1 10*9/L (ref 0.0–0.7)
EOSINOPHILS RELATIVE PERCENT: 4.2 %
HEMATOCRIT: 36.6 % — ABNORMAL LOW (ref 38.0–50.0)
HEMOGLOBIN: 11.8 g/dL — ABNORMAL LOW (ref 13.5–17.5)
LYMPHOCYTES ABSOLUTE COUNT: 0.5 10*9/L — ABNORMAL LOW (ref 0.7–4.0)
LYMPHOCYTES RELATIVE PERCENT: 16.8 %
MEAN CORPUSCULAR HEMOGLOBIN CONC: 32.2 g/dL (ref 30.0–36.0)
MEAN CORPUSCULAR HEMOGLOBIN: 25.6 pg — ABNORMAL LOW (ref 26.0–34.0)
MEAN CORPUSCULAR VOLUME: 79.4 fL — ABNORMAL LOW (ref 81.0–95.0)
MEAN PLATELET VOLUME: 9.1 fL (ref 7.0–10.0)
MONOCYTES ABSOLUTE COUNT: 0.4 10*9/L (ref 0.1–1.0)
MONOCYTES RELATIVE PERCENT: 11.8 %
NEUTROPHILS ABSOLUTE COUNT: 2.1 10*9/L (ref 1.7–7.7)
NEUTROPHILS RELATIVE PERCENT: 66.5 %
PLATELET COUNT: 193 10*9/L (ref 150–450)
RED BLOOD CELL COUNT: 4.61 10*12/L (ref 4.32–5.72)
RED CELL DISTRIBUTION WIDTH: 18.7 % — ABNORMAL HIGH (ref 12.0–15.0)
WBC ADJUSTED: 3.1 10*9/L — ABNORMAL LOW (ref 3.5–10.5)

## 2020-08-09 LAB — URINALYSIS
BILIRUBIN UA: NEGATIVE
BLOOD UA: NEGATIVE
GLUCOSE UA: NEGATIVE
KETONES UA: NEGATIVE
LEUKOCYTE ESTERASE UA: NEGATIVE
NITRITE UA: NEGATIVE
PH UA: 6 (ref 5.0–9.0)
PROTEIN UA: NEGATIVE
RBC UA: <1 /HPF (ref <3)
SPECIFIC GRAVITY UA: 1.015 (ref 1.005–1.030)
SQUAMOUS EPITHELIAL: <1 /HPF (ref 0–5)
UROBILINOGEN UA: 0.2
WBC UA: 1 /HPF (ref <2)

## 2020-08-09 LAB — PARATHYROID HORMONE (PTH): PARATHYROID HORMONE INTACT: 538.4 pg/mL — ABNORMAL HIGH (ref 18.4–80.1)

## 2020-08-09 LAB — COMPREHENSIVE METABOLIC PANEL
ALBUMIN: 4.6 g/dL (ref 3.4–5.0)
ALKALINE PHOSPHATASE: 276 U/L — ABNORMAL HIGH (ref 46–116)
ALT (SGPT): 17 U/L (ref 10–49)
ANION GAP: 5 mmol/L (ref 5–14)
AST (SGOT): 20 U/L (ref <=34)
BILIRUBIN TOTAL: 0.4 mg/dL (ref 0.3–1.2)
BLOOD UREA NITROGEN: 24 mg/dL — ABNORMAL HIGH (ref 9–23)
BUN / CREAT RATIO: 13
CALCIUM: 10 mg/dL (ref 8.7–10.4)
CHLORIDE: 110 mmol/L — ABNORMAL HIGH (ref 98–107)
CO2: 23.2 mmol/L (ref 20.0–31.0)
CREATININE: 1.79 mg/dL — ABNORMAL HIGH
EGFR CKD-EPI AA MALE: 56 mL/min/{1.73_m2} — ABNORMAL LOW (ref >=60)
EGFR CKD-EPI NON-AA MALE: 48 mL/min/{1.73_m2} — ABNORMAL LOW (ref >=60)
GLUCOSE RANDOM: 107 mg/dL (ref 70–179)
POTASSIUM: 4.5 mmol/L (ref 3.4–4.5)
PROTEIN TOTAL: 7.9 g/dL (ref 5.7–8.2)
SODIUM: 138 mmol/L (ref 135–145)

## 2020-08-09 LAB — PROTEIN / CREATININE RATIO, URINE
CREATININE, URINE: 125 mg/dL
PROTEIN URINE: 25.1 mg/dL
PROTEIN/CREAT RATIO, URINE: 0.201

## 2020-08-09 LAB — PHOSPHORUS: PHOSPHORUS: 2.6 mg/dL (ref 2.4–5.1)

## 2020-08-09 LAB — MAGNESIUM: MAGNESIUM: 1.7 mg/dL (ref 1.6–2.6)

## 2020-08-10 LAB — TACROLIMUS LEVEL, TROUGH: TACROLIMUS, TROUGH: 7 ng/mL (ref 5.0–15.0)

## 2020-08-11 LAB — BK VIRUS QUANTITATIVE PCR, BLOOD: BK BLOOD RESULT: NOT DETECTED

## 2020-08-11 LAB — CMV DNA, QUANTITATIVE, PCR: CMV VIRAL LD: NOT DETECTED

## 2020-08-13 LAB — VITAMIN D 25 HYDROXY: VITAMIN D, TOTAL (25OH): 6.4 ng/mL — ABNORMAL LOW (ref 20.0–80.0)

## 2020-08-20 LAB — HLA DS POST TRANSPLANT
ANTI-DONOR DRW #1 MFI: 411 MFI
ANTI-DONOR DRW #2 MFI: 411 MFI
ANTI-DONOR HLA-A #1 MFI: 23 MFI
ANTI-DONOR HLA-A #2 MFI: 0 MFI
ANTI-DONOR HLA-B #1 MFI: 0 MFI
ANTI-DONOR HLA-B #2 MFI: 4 MFI
ANTI-DONOR HLA-C #1 MFI: 61 MFI
ANTI-DONOR HLA-C #2 MFI: 248 MFI
ANTI-DONOR HLA-DP AG #1 MFI: 60 MFI
ANTI-DONOR HLA-DQB #1 MFI: 623 MFI
ANTI-DONOR HLA-DR #1 MFI: 134 MFI
ANTI-DONOR HLA-DR #2 MFI: 204 MFI

## 2020-08-20 LAB — FSAB CLASS 2 ANTIBODY SPECIFICITY: HLA CL2 AB RESULT: POSITIVE

## 2020-08-20 LAB — FSAB CLASS 1 ANTIBODY SPECIFICITY: HLA CLASS 1 ANTIBODY RESULT: POSITIVE

## 2020-08-24 NOTE — Unmapped (Unsigned)
Transplant Nephrology Clinic Visit      History of Present Illness    Russell Wade is a 35 y.o. 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 He does not have evidence of donor specific antibodies as of 03/23/20. His most recent baseline creatinine has been 1.7-2.4, but he does not get regular labs and has not shown/cancelled scheduled biopsies.    This is his second kidney transplant, his first was from a deceased donor on 10-Dec-2007 and failed 11/24/2013 secondary to rejection. He underwent transplant nephrectomy 03/20/2014.    This transplant has been complicated by delayed graft function, he required dialysis for low urine output and was discharged from the hospital on dialysis. He was initially on Myfortic 720 mg BID, reduced to 540 mg BID on 03/17/19 for diarrhea. His last dialysis was 03/12/19. He underwent transplant renal biopsy on 03/14/19 that showed diffuse subacute tubular injury with no evidence of rejection. His ureteral stent was removed 03/28/19. Required Kayexalate and Lokelma intermittently for hyperkalemia, Bactrim held. Envarsus levels elevated/labile as well. Required some Aranesp for anemia.     After stopping dialysis, his creatinine slowly trended down into the mid 2s in December, the started to rise again into the 3s and back into the mid 2s in January 2021. For that reason, we recommended biopsy at the end of January. He finally scheduled the biopsy for 07/29/19. A day prior to that biopsy, he had a COVID exposure and the biopsy was rescheduled for 08/08/19. Unfortunately he did not show for that biopsy due to not having a ride. At his visit on 08/25/19 he could not recall when he took his Envarsus the day prior, and we noted that there were a lot of issues getting a reliable trough. We recommended admission for biopsy the next day but he refused due to work and family obligations. He did agree to schedule a biopsy the following week. He was scheduled for biopsy 09/05/19 but again he no showed. He was seen in clinic on 10/20/19 with creatinine of 2.44, has not had labs since that visit.    He saw audiology on 01/10/20 and was shown to have mild bilateral sensorineural hearing loss, unchanged from 2014. They recommended he return for a hearing aid consult on 7/29, do not see that he showed for that, rescheduled for 9/13, did attend that and was being fitted for a hearing aid.    After his visit 02/22/20, patient did not get his kidney transplant biopsy. He canceled due to transportation and never rescheduled it.     Interval history since last visit 03/23/20    He has had labs once since his last visit. His creatinine was improved at 1.74, tacrolimus level was 7.0 and was a 14.5 hour trough. No changes made since he is unreliable about getting labs.    Patient presents today for follow up.  He was a no show for visit 05/04/20.    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 He took today off for the in-person visit, and had intended to still get labs, but he got called into work to cover for someone else.    He notes some sinus symptoms that began 2-3 weeks ago. Notes some yellow mucus and minimally productive cough. No fever or SOB. Did have some coworkers with COVID. Did home test last week that was negative. Had a PCR test  at [facility] department, did not get result but assumes was negative as they did not get in touch with him. Symptoms are improving.    He has not yet had a third dose of COVID vaccine, was planning to do so in clinic today.    He specifically denies fever, myalgias, upper respiratory/GI symptoms, or change in taste/smell.    Review of Systems    Otherwise on review of systems patient denies fever or chills, chest pain, SOB, PND or orthopnea, lower extremity edema. Denies N/V/abdominal pain. No dysuria, hematuria or difficulty voiding. Bowel movements normal. Denies joint pain or rash. All other systems are reviewed and are negative.    Medications    Current Outpatient Medications   Medication Sig Dispense Refill   ??? acetaminophen (TYLENOL) 500 MG tablet Take 1-2 tablets (500-1,000 mg total) by mouth every eight (8) hours as needed for pain or fever (> 38C). Max dose 3000 mg per day 100 tablet 11   ??? amLODIPine (NORVASC) 5 MG tablet Take 2 tablets (10 mg total) by mouth daily. 60 tablet 11   ??? carvediloL (COREG) 6.25 MG tablet TAKE 1 TABLET (6.25 MG TOTAL) BY MOUTH TWO (2) TIMES A DAY. 180 tablet 3   ??? cinacalcet (SENSIPAR) 30 MG tablet Take 1 tablet (30 mg total) by mouth daily. 30 tablet 11   ??? mycophenolate (MYFORTIC) 180 MG EC tablet Take 2 tablets (360 mg total) by mouth Two (2) times a day. 120 tablet 11   ??? sodium bicarbonate 650 mg tablet Take 2 tablets (1,300 mg total) by mouth Two (2) times a day. 120 tablet 11   ??? sodium zirconium cyclosilicate (LOKELMA) 5 gram PwPk packet Take 2 packets (10 g total) by mouth two (2) times a day as needed. (Patient not taking: Reported on 11/17/2019)     ??? tacrolimus (ENVARSUS XR) 1 mg Tb24 extended release tablet Take 2 tablets (2 mg total) by mouth daily with 4 (4 mg) tablets for a total dose of 18 mg daily 60 tablet 11   ??? tacrolimus (ENVARSUS XR) 4 mg Tb24 extended release tablet Take 4 tablets (16 mg total) by mouth daily with 2 (1mg ) tablets for a total dose of 18 mg daily 120 tablet 3   ??? zolpidem (AMBIEN) 10 mg tablet Take 10 mg by mouth nightly as needed for sleep. (Patient not taking: Reported on 02/22/2020)       No current facility-administered medications for this visit.       Physical Exam    There were no vitals taken for this visit.  General: Patient is a pleasant male in {Pain Distress:[phone]::no apparent distress}.  Eyes: Sclera {eyes:21926}.  ENT: Oropharynx without lesions.   Neck: {neck:21927}.  Lungs: {lungs:21928}.  Cardiovascular: {cardiovascular:21929}.  Abdomen: {KAT transplant abdomen:31800}.  Extremities: {extremities:21931}.  Skin: {skin:21932}.  Neurological: {neuro:21933}.  Psychiatric: {psych:21936}.        Laboratory Results    No results found for this or any previous visit (from the past 170 hour(s)).    Assessment and Plan    1. Status post second renal transplant. His creatinine had come down to 1.74 at his visit 11/17/19, which was the lowest it had been since transplant. I was comfortable holding off on biopsy at that time as long as the creatinine continued to fall, but then he did not get any labs following that visit. At his visit 03/23/20 it was back up to 2.48. He was to repeat his labs, he has  not done so. I am not sure that there is anything that I can say that will improve his adherence to lab draws. He has cancelled or no-showed to biopsies, to the point that I am not going to schedule him for another (even though I think he needs it) until he gives [place] a date himself. I don't want to waste biopsy spots for other patients that may need them when he is extremely unlikely to follow through. He states that he has every other Friday off and will get labs the next Friday.     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 Did see audiology and was found to have stable hearing loss. Recommended being fitted for right hearing aid.     3. Hypertension. Does not measure blood pressure at home.    4. Hyperkalemia. No recent labs.    5. Hypomagnesemia. No recent labs.    6. Hypercalcemia. Calcium 11.2 at last visit, started Sensipar 30mg . No recent labs.    7. Metabolic acidosis. No recent labs.    8. Hyperlipidemia. Triglycerides have been elevated (381 01/25/20 from 415 11/17/19), did some dietary counseling. May need to treat if remain high. No recent labs.    9. Health maintenance. Received the flu shot 03/23/20, has not received pneumococcal vaccines. Received 2 doses of Covid vaccine, encouraged to get third ASAP. Will check spike IgG Ab to see if he qualifies for Evusheld. We discussed that while we continue to recommend the COVID vaccine for all eligible patients, we know that kidney transplant patients do not respond as strongly due to their immunosuppression, so he should continue to follow the CDC guidelines for patients who are not vaccinated. I would also encourage all household members and close contacts that are eligible for vaccination to get vaccinated. We discussed the importance of ongoing social distancing, wearing a mask outside the home, and hand washing/sanitizing.     10. Will see patient back in 6 weeks, or sooner if needed. Hopefully will get labs in the meantime.

## 2020-08-31 NOTE — Unmapped (Signed)
[facility] Clinical Assessment & Refill Coordination Note    Russell Wade, DOB: [birth year]  Phone: [phone] (home)     All above HIPAA information was verified with patient.     Was a Nurse, learning disability used for this call? No    Specialty Medication(s):   Transplant: Envarsus 1mg , Envarsus 4mg  and Myfortic 180mg      Current Outpatient Medications   Medication Sig Dispense Refill   ??? acetaminophen (TYLENOL) 500 MG tablet Take 1-2 tablets (500-1,000 mg total) by mouth every eight (8) hours as needed for pain or fever (> 38C). Max dose 3000 mg per day 100 tablet 11   ??? amLODIPine (NORVASC) 5 MG tablet Take 2 tablets (10 mg total) by mouth daily. 60 tablet 11   ??? carvediloL (COREG) 6.25 MG tablet TAKE 1 TABLET (6.25 MG TOTAL) BY MOUTH TWO (2) TIMES A DAY. 180 tablet 3   ??? cinacalcet (SENSIPAR) 30 MG tablet Take 1 tablet (30 mg total) by mouth daily. 30 tablet 11   ??? mycophenolate (MYFORTIC) 180 MG EC tablet Take 2 tablets (360 mg total) by mouth Two (2) times a day. 120 tablet 11   ??? sodium bicarbonate 650 mg tablet Take 2 tablets (1,300 mg total) by mouth Two (2) times a day. 120 tablet 11   ??? sodium zirconium cyclosilicate (LOKELMA) 5 gram PwPk packet Take 2 packets (10 g total) by mouth two (2) times a day as needed. (Patient not taking: Reported on 11/17/2019)     ??? tacrolimus (ENVARSUS XR) 1 mg Tb24 extended release tablet Take 2 tablets (2 mg total) by mouth daily with 4 (4 mg) tablets for a total dose of 18 mg daily 60 tablet 11   ??? tacrolimus (ENVARSUS XR) 4 mg Tb24 extended release tablet Take 4 tablets (16 mg total) by mouth daily with 2 (1mg ) tablets for a total dose of 18 mg daily 120 tablet 3   ??? zolpidem (AMBIEN) 10 mg tablet Take 10 mg by mouth nightly as needed for sleep. (Patient not taking: Reported on 02/22/2020)       No current facility-administered medications for this visit.        Changes to medications: Ericberto reports no changes at this time.    Allergies Allergen Reactions   ??? Tuberculin Ppd      Other reaction(s): Skin Rash       Changes to allergies: No    SPECIALTY MEDICATION ADHERENCE     envarsus 1mg   : 10 days of medicine on hand   envarsus 4mg   : 10 days of medicine on hand   Myfortic 180mg   : 10 days of medicine on hand       Medication Adherence    Patient reported X missed doses in the last month: 2  Specialty Medication: envarsus 1mg   Patient is on additional specialty medications: Yes  Additional Specialty Medications: envarsus 4mg   Patient Reported Additional Medication X Missed Doses in the Last Month: 0  Patient is on more than two specialty medications: Yes  Specialty Medication: myfortic 180mg   Patient Reported Additional Medication X Missed Doses in the Last Month: 0          Specialty medication(s) dose(s) confirmed: Regimen is correct and unchanged.     Are there any concerns with adherence? Yes: patient states he may have forgotten a few doses of the 1mg  tablets, but isn't sure. he doesn't use pillbox but will consider. i told him to make sure  to notify coordinator in future if misses/believes he has missed doses as bloodwork may be needed. i will message clinic as well    Adherence counseling provided? Yes: see above    CLINICAL MANAGEMENT AND INTERVENTION      Clinical Benefit Assessment:    Do you feel the medicine is effective or helping your condition? Yes    Clinical Benefit counseling provided? Not needed    Adverse Effects Assessment:    Are you experiencing any side effects? No    Are you experiencing difficulty administering your medicine? No    Quality of Life Assessment:    How many days over the past month did your transplant  keep you from your normal activities? For example, brushing your teeth or getting up in the morning. 0    Have you discussed this with your provider? Not needed    Therapy Appropriateness:    Is therapy appropriate? Yes, therapy is appropriate and should be continued    DISEASE/MEDICATION-SPECIFIC INFORMATION      N/A    PATIENT SPECIFIC NEEDS     - Does the patient have any physical, cognitive, or cultural barriers? No    - Is the patient high risk? Yes, patient is taking a REMS drug. Medication is dispensed in compliance with REMS program    - Does the patient require a Care Management Plan? No     - Does the patient require physician intervention or other additional services (i.e. nutrition, smoking cessation, social work)? No      SHIPPING     Specialty Medication(s) to be Shipped:   Transplant: Envarsus 1mg , Envarsus 4mg  and Myfortic 180mg     Other medication(s) to be shipped: apap, norvasc, cinacalcet, sodium bicarb     Changes to insurance: No    Delivery Scheduled: Yes, Expected medication delivery date: 09/04/2020.     Medication will be delivered via UPS to the confirmed prescription address in [facility].    The patient will receive a drug information handout for each medication shipped and additional FDA Medication Guides as required.  Verified that patient has previously received a Conservation officer, historic buildings.    All of the patient's questions and concerns have been addressed.    [name]   [facility] Specialty Pharmacist

## 2020-09-03 MED FILL — CINACALCET 30 MG TABLET: ORAL | 30 days supply | Qty: 30 | Fill #5

## 2020-09-03 MED FILL — ENVARSUS XR 1 MG TABLET,EXTENDED RELEASE: ORAL | 30 days supply | Qty: 60 | Fill #10

## 2020-09-03 MED FILL — SODIUM BICARBONATE 650 MG TABLET: ORAL | 30 days supply | Qty: 120 | Fill #5

## 2020-09-03 MED FILL — ENVARSUS XR 4 MG TABLET,EXTENDED RELEASE: ORAL | 30 days supply | Qty: 120 | Fill #2

## 2020-09-03 MED FILL — ACETAMINOPHEN 500 MG TABLET: ORAL | 17 days supply | Qty: 100 | Fill #5

## 2020-09-03 MED FILL — MYFORTIC 180 MG TABLET,DELAYED RELEASE: ORAL | 30 days supply | Qty: 120 | Fill #5

## 2020-09-03 MED FILL — AMLODIPINE 5 MG TABLET: ORAL | 30 days supply | Qty: 60 | Fill #6

## 2020-09-26 NOTE — Unmapped (Signed)
[facility] Refill Coordination Note    Specialty Medication(s) to be Shipped:   Transplant: Envarsus 1mg , Envarsus 4mg  and Myfortic 180mg     Other medication(s) to be shipped: tylenol, norvasc, sensipar, sodium bicarb     Russell Wade, DOB: Mar 05, 1986  Phone: [phone] (home)       All above HIPAA information was verified with patient.     Was a Nurse, learning disability used for this call? No    Completed refill call assessment today to schedule patient's medication shipment from the [facility] [phone]).  All relevant notes have been reviewed.     Specialty medication(s) and dose(s) confirmed: Regimen is correct and unchanged.   Changes to medications: Briton reports no changes at this time.  Changes to insurance: No  New side effects reported not previously addressed with a pharmacist or physician: None reported  Questions for the pharmacist: No    Confirmed patient received a Conservation officer, historic buildings and a Surveyor, mining with first shipment. The patient will receive a drug information handout for each medication shipped and additional FDA Medication Guides as required.       DISEASE/MEDICATION-SPECIFIC INFORMATION        N/A    SPECIALTY MEDICATION ADHERENCE     Medication Adherence    Patient reported X missed doses in the last month: 0  Specialty Medication: envarsus 1mg   Patient is on additional specialty medications: Yes  Additional Specialty Medications: envarsus 4mg   Patient Reported Additional Medication X Missed Doses in the Last Month: 0  Patient is on more than two specialty medications: Yes  Specialty Medication: myfortic 180mg   Patient Reported Additional Medication X Missed Doses in the Last Month: 0              Were doses missed due to medication being on hold? No    envarsus 1mg   : 10 days of medicine on hand   envarsus 4mg   : 10 days of medicine on hand   Myfortic 180mg   : 10 days of medicine on hand       REFERRAL TO PHARMACIST     Referral to the pharmacist: Not needed      [facility]     Shipping address confirmed in Epic.     Delivery Scheduled: Yes, Expected medication delivery date: 10/02/2020.     Medication will be delivered via UPS to the prescription address in Epic [facility].    [name]   [facility] Specialty Pharmacist

## 2020-10-01 DIAGNOSIS — D849 Immunodeficiency, unspecified: Principal | ICD-10-CM

## 2020-10-01 DIAGNOSIS — Z940 Kidney transplant status: Principal | ICD-10-CM

## 2020-10-01 MED ORDER — TACROLIMUS XR 1 MG TABLET,EXTENDED RELEASE 24 HR
ORAL_TABLET | Freq: Every day | ORAL | 11 refills | 30 days | Status: CP
Start: 2020-10-01 — End: ?
  Filled 2020-10-01: qty 60, 30d supply, fill #0

## 2020-10-01 MED FILL — CINACALCET 30 MG TABLET: ORAL | 30 days supply | Qty: 30 | Fill #6

## 2020-10-01 MED FILL — SODIUM BICARBONATE 650 MG TABLET: ORAL | 30 days supply | Qty: 120 | Fill #6

## 2020-10-01 MED FILL — ACETAMINOPHEN 500 MG TABLET: ORAL | 17 days supply | Qty: 100 | Fill #6

## 2020-10-01 MED FILL — AMLODIPINE 5 MG TABLET: ORAL | 30 days supply | Qty: 60 | Fill #7

## 2020-10-01 MED FILL — MYFORTIC 180 MG TABLET,DELAYED RELEASE: ORAL | 30 days supply | Qty: 120 | Fill #6

## 2020-10-01 MED FILL — ENVARSUS XR 4 MG TABLET,EXTENDED RELEASE: ORAL | 30 days supply | Qty: 120 | Fill #3

## 2020-10-11 ENCOUNTER — Other Ambulatory Visit (HOSPITAL_COMMUNITY)
Admission: RE | Admit: 2020-10-11 | Discharge: 2020-10-11 | Disposition: A | Discharge status: Home / Self Care | Payer: Medicare Other | Source: Ambulatory Visit | Attending: Nephrology | Admitting: Nephrology

## 2020-10-11 ENCOUNTER — Ambulatory Visit: Admit: 2020-10-11 | Discharge: 2020-10-11 | Payer: MEDICARE | Attending: Nephrology | Primary: Nephrology

## 2020-10-11 ENCOUNTER — Ambulatory Visit: Admit: 2020-10-11 | Discharge: 2020-10-11 | Payer: MEDICARE

## 2020-10-11 DIAGNOSIS — Z940 Kidney transplant status: Principal | ICD-10-CM

## 2020-10-11 DIAGNOSIS — Z79899 Other long term (current) drug therapy: Principal | ICD-10-CM

## 2020-10-11 DIAGNOSIS — E1129 Type 2 diabetes mellitus with other diabetic kidney complication: Secondary | ICD-10-CM | POA: Insufficient documentation

## 2020-10-11 DIAGNOSIS — Z789 Other specified health status: Secondary | ICD-10-CM | POA: Diagnosis not present

## 2020-10-11 DIAGNOSIS — D631 Anemia in chronic kidney disease: Secondary | ICD-10-CM | POA: Diagnosis not present

## 2020-10-11 DIAGNOSIS — Z09 Encounter for follow-up examination after completed treatment for conditions other than malignant neoplasm: Secondary | ICD-10-CM | POA: Diagnosis not present

## 2020-10-11 DIAGNOSIS — Z9483 Pancreas transplant status: Secondary | ICD-10-CM | POA: Insufficient documentation

## 2020-10-11 DIAGNOSIS — N390 Urinary tract infection, site not specified: Secondary | ICD-10-CM | POA: Diagnosis not present

## 2020-10-11 DIAGNOSIS — D899 Disorder involving the immune mechanism, unspecified: Secondary | ICD-10-CM | POA: Insufficient documentation

## 2020-10-11 LAB — CBC W/ DIFFERENTIAL
BASOPHILS ABSOLUTE COUNT: 0 10*3/uL (ref 0.0–0.1)
BASOPHILS RELATIVE PERCENT: 1
EOSINOPHILS ABSOLUTE COUNT: 0.1 10*3/uL (ref 0.0–0.5)
EOSINOPHILS RELATIVE PERCENT: 4 %
HEMATOCRIT: 38 — ABNORMAL LOW
HEMOGLOBIN: 11.9 — ABNORMAL LOW
LYMPHOCYTES ABSOLUTE COUNT: 0.8 10*3/uL
LYMPHOCYTES RELATIVE PERCENT: 26 %
MEAN CORPUSCULAR HEMOGLOBIN CONC: 31.3 g/dL (ref 30.0–36.0)
MEAN CORPUSCULAR HEMOGLOBIN: 26.3 pg (ref 26.0–34.0)
MEAN CORPUSCULAR VOLUME: 83.9 fL
MONOCYTES ABSOLUTE COUNT: 0.5 10*3/uL (ref 0.1–1.0)
MONOCYTES RELATIVE PERCENT: 17 %
NEUTROPHILS ABSOLUTE COUNT: 1.5 10*3/uL — ABNORMAL LOW (ref 1.7–7.7)
NEUTROPHILS RELATIVE PERCENT: 51 %
NUCLEATED RED BLOOD CELLS: 0 % (ref 0.0–0.2)
PLATELET COUNT: 170 10*3/uL (ref 150–400)
RED BLOOD CELL COUNT: 4.53 MIL/uL (ref 4.22–5.81)
RED CELL DISTRIBUTION WIDTH: 16.4 — ABNORMAL HIGH
WHITE BLOOD CELL COUNT: 2.9 — ABNORMAL LOW

## 2020-10-11 LAB — URINALYSIS
BACTERIA: NONE SEEN /HPF
BILIRUBIN UA: NEGATIVE
BLOOD UA: NEGATIVE
GLUCOSE UA: NEGATIVE
KETONES UA: NEGATIVE
LEUKOCYTE ESTERASE UA: NEGATIVE
NITRITE UA: NEGATIVE
PH UA: 6.5 (ref 5.0–9.0)
PROTEIN UA: NEGATIVE
RBC UA: <1 /HPF (ref <3)
SPECIFIC GRAVITY UA: 1.01 (ref 1.005–1.030)
SQUAMOUS EPITHELIAL: <1 /HPF (ref 0–5)
UROBILINOGEN UA: 0.2
WBC UA: <1 /HPF (ref <2)

## 2020-10-11 LAB — BASIC METABOLIC PANEL
ANION GAP: 6 (ref 5–15)
Anion gap: 6 (ref 5–15)
BLOOD UREA NITROGEN: 28 mg/dL — ABNORMAL HIGH (ref 6–20)
BUN: 28 mg/dL — ABNORMAL HIGH (ref 6–20)
CALCIUM: 9.6 mg/dL (ref 8.9–10.3)
CHLORIDE: 107 mmol/L (ref 98–111)
CO2: 24 mmol/L (ref 22–32)
CO2: 24 mmol/L (ref 22–32)
CREATININE: 1.82 — ABNORMAL HIGH (ref 0.61–1.24)
Calcium: 9.6 mg/dL (ref 8.9–10.3)
Chloride: 107 mmol/L (ref 98–111)
Creatinine, Ser: 1.82 mg/dL — ABNORMAL HIGH (ref 0.61–1.24)
EGFR CKD-EPI AA MALE: 49 mL/min — ABNORMAL LOW
GFR, Estimated: 49 mL/min — ABNORMAL LOW (ref >60)
GLUCOSE RANDOM: 111 mg/dL — ABNORMAL HIGH (ref 70–99)
Glucose, Bld: 111 mg/dL — ABNORMAL HIGH (ref 70–99)
MAGNESIUM: 1.6 mg/dL — ABNORMAL LOW (ref 1.7–2.4)
PHOSPHORUS: 3.3
POTASSIUM: 5.2 — ABNORMAL HIGH
Potassium: 5.2 mmol/L — ABNORMAL HIGH (ref 3.5–5.1)
SODIUM: 137 mmol/L (ref 135–145)
Sodium: 137 mmol/L (ref 135–145)

## 2020-10-11 LAB — PROTEIN / CREATININE RATIO, URINE
CREATININE, URINE: 96.8 mg/dL
PROTEIN URINE: 23.7 mg/dL
PROTEIN/CREAT RATIO, URINE: 0.245

## 2020-10-11 LAB — CBC WITH DIFFERENTIAL/PLATELET
Abs Immature Granulocytes: 0.03 10*3/uL (ref 0.00–0.07)
Basophils Absolute: 0 10*3/uL (ref 0.0–0.1)
Basophils Relative: 1 %
Eosinophils Absolute: 0.1 10*3/uL (ref 0.0–0.5)
Eosinophils Relative: 4 %
HCT: 38 % — ABNORMAL LOW (ref 39.0–52.0)
Hemoglobin: 11.9 g/dL — ABNORMAL LOW (ref 13.0–17.0)
Immature Granulocytes: 1 %
Lymphocytes Relative: 26 %
Lymphs Abs: 0.8 10*3/uL (ref 0.7–4.0)
MCH: 26.3 pg (ref 26.0–34.0)
MCHC: 31.3 g/dL (ref 30.0–36.0)
MCV: 83.9 fL (ref 80.0–100.0)
Monocytes Absolute: 0.5 10*3/uL (ref 0.1–1.0)
Monocytes Relative: 17 %
Neutro Abs: 1.5 10*3/uL — ABNORMAL LOW (ref 1.7–7.7)
Neutrophils Relative %: 51 %
Platelets: 170 10*3/uL (ref 150–400)
RBC: 4.53 MIL/uL (ref 4.22–5.81)
RDW: 16.4 % — ABNORMAL HIGH (ref 11.5–15.5)
WBC: 2.9 10*3/uL — ABNORMAL LOW (ref 4.0–10.5)
nRBC: 0 % (ref 0.0–0.2)

## 2020-10-11 LAB — MAGNESIUM: Magnesium: 1.6 mg/dL — ABNORMAL LOW (ref 1.7–2.4)

## 2020-10-11 LAB — PHOSPHORUS: Phosphorus: 3.3 mg/dL (ref 2.5–4.6)

## 2020-10-11 MED ORDER — GABAPENTIN 100 MG CAPSULE
ORAL_CAPSULE | Freq: Three times a day (TID) | ORAL | 3 refills | 90.00000 days | Status: CP | PRN
Start: 2020-10-11 — End: 2021-10-11
  Filled 2020-10-17: qty 270, 90d supply, fill #0

## 2020-10-11 NOTE — Unmapped (Addendum)
Transplant Nephrology Clinic Visit      History of Present Illness    Russell Wade is a 35 y.o. 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 He does not have evidence of donor specific antibodies as of 08/09/20. His most recent baseline creatinine has been 1.7-2.4, but he does not get regular labs and has not shown/cancelled scheduled biopsies.    This is his second kidney transplant, his first was from a deceased donor on 12/02/2007 and failed 11/24/2013 secondary to rejection. He underwent transplant nephrectomy 03/20/2014.    This transplant has been complicated by delayed graft function, he required dialysis for low urine output and was discharged from the hospital on dialysis. He was initially on Myfortic 720 mg BID, reduced to 540 mg BID on 03/17/19 for diarrhea. His last dialysis was 03/12/19. He underwent transplant renal biopsy on 03/14/19 that showed diffuse subacute tubular injury with no evidence of rejection. His ureteral stent was removed 03/28/19. Required Kayexalate and Lokelma intermittently for hyperkalemia, Bactrim held. Envarsus levels elevated/labile as well. Required some Aranesp for anemia.     After stopping dialysis, his creatinine slowly trended down into the mid 2s in December, the started to rise again into the 3s and back into the mid 2s in January 2021. For that reason, we recommended biopsy at the end of January. He finally scheduled the biopsy for 07/29/19. A day prior to that biopsy, he had a COVID exposure and the biopsy was rescheduled for 08/08/19. Unfortunately he did not show for that biopsy due to not having a ride. At his visit on 08/25/19 he could not recall when he took his Envarsus the day prior, and we noted that there were a lot of issues getting a reliable trough. We recommended admission for biopsy the next day but he refused due to work and family obligations. He did agree to schedule a biopsy the following week. He was scheduled for biopsy 09/05/19 but again he no showed. He was seen in clinic on 10/20/19 with creatinine of 2.44, has not had labs since that visit.    He saw audiology on 01/10/20 and was shown to have mild bilateral sensorineural hearing loss, unchanged from 2014. They recommended he return for a hearing aid consult on 7/29, do not see that he showed for that, rescheduled for 9/13, did attend that and was being fitted for a hearing aid.    After his visit 02/22/20, patient did not get his kidney transplant biopsy. He canceled due to transportation and never rescheduled it.     Interval history since last visit 07/06/2020    He presents today for follow up. He has had labs once since his last visit.    Generally he has been feeling well. Notes some nerve pain numbness/tingling over transplant site. Denies dysuria or hematuria. No fever. Some mild tremor which is stable. No other complaints today.    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     He specifically denies fever, myalgias, upper respiratory/GI symptoms, or change in taste/smell.    Last dose of Envarsus: 10 am - 12 pm yesterday    Review of Systems    Otherwise on review of systems patient denies fever or chills, chest pain, SOB, PND or orthopnea, lower extremity edema. Denies N/V/abdominal pain. No dysuria, hematuria or difficulty voiding. Bowel movements normal. Denies joint pain or rash.  All other systems are reviewed and are negative.    Medications    Current Outpatient Medications   Medication Sig Dispense Refill   ??? acetaminophen (TYLENOL) 500 MG tablet Take 1-2 tablets (500-1,000 mg total) by mouth every eight (8) hours as needed for pain or fever (> 38C). Max dose 3000 mg per day 100 tablet 11   ??? amLODIPine (NORVASC) 5 MG tablet Take 2 tablets (10 mg total) by mouth daily. 60 tablet 11   ??? carvediloL (COREG) 6.25 MG tablet TAKE 1 TABLET (6.25 MG TOTAL) BY MOUTH TWO (2) TIMES A DAY. 180 tablet 3   ??? cinacalcet (SENSIPAR) 30 MG tablet Take 1 tablet (30 mg total) by mouth daily. 30 tablet 11   ??? mycophenolate (MYFORTIC) 180 MG EC tablet Take 2 tablets (360 mg total) by mouth Two (2) times a day. 120 tablet 11   ??? sodium bicarbonate 650 mg tablet Take 2 tablets (1,300 mg total) by mouth Two (2) times a day. 120 tablet 11   ??? sodium zirconium cyclosilicate (LOKELMA) 5 gram PwPk packet Take 2 packets (10 g total) by mouth two (2) times a day as needed. (Patient not taking: Reported on 11/17/2019)     ??? tacrolimus (ENVARSUS XR) 1 mg Tb24 extended release tablet Take 2 tablets (2 mg total) by mouth daily with 4 (4 mg) tablets for a total dose of 18 mg daily 60 tablet 11   ??? tacrolimus (ENVARSUS XR) 4 mg Tb24 extended release tablet Take 4 tablets (16 mg total) by mouth daily with 2 (1mg ) tablets for a total dose of 18 mg daily 120 tablet 3   ??? zolpidem (AMBIEN) 10 mg tablet Take 10 mg by mouth nightly as needed for sleep. (Patient not taking: Reported on 02/22/2020)       No current facility-administered medications for this visit.       Physical Exam    BP 123/83 (BP Site: R Arm, BP Position: Sitting, BP Cuff Size: Medium)  - Pulse 75  - Temp 36.2 ??C (97.2 ??F) (Temporal)  - Wt 71.3 kg (157 lb 3.2 oz)  - BMI 23.21 kg/m??   General: Patient is a pleasant male in no apparent distress.  Eyes: Sclera anicteric.  ENT: Mask in place.  Neck: Supple without LAD/JVD/bruits.  Lungs: Clear to auscultation bilaterally, no wheezes/rales/rhonchi.  Cardiovascular: Regular rate and rhythm without murmurs, rubs or gallops.  Abdomen: Soft, notender/nondistended. Positive bowel sounds. No tenderness over the graft.  Extremities: Without edema, joints without evidence of synovitis.  Skin: Without rash.  Neurological: Grossly nonfocal.  Psychiatric: Mood and affect appropriate.      Laboratory Results    Results for orders placed or performed in visit on 10/11/20   Urine Culture    Specimen: Clean Catch; Urine   Result Value Ref Range    Urine Culture, Comprehensive NO GROWTH    Urinalysis   Result Value Ref Range    Color, UA Yellow     Clarity, UA Clear     Specific Gravity, UA 1.010 1.005 - 1.030    pH, UA 6.5 5.0 - 9.0    Leukocyte Esterase, UA Negative Negative    Nitrite, UA Negative Negative    Protein, UA Negative Negative    Glucose, UA Negative Negative    Ketones, UA Negative Negative    Urobilinogen, UA 0.2 mg/dL 0.2 - 2.0 mg/dL    Bilirubin, UA Negative Negative    Blood, UA Negative Negative  RBC, UA <1 <3 /HPF    WBC, UA <1 <2 /HPF    Squam Epithel, UA <1 0 - 5 /HPF    Bacteria, UA None Seen None Seen /HPF   Protein/Creatinine Ratio, Urine   Result Value Ref Range    Creat U 96.8 Undefined mg/dL    Protein, Ur 81.1 Undefined mg/dL    Protein/Creatinine Ratio, Urine 0.245 Undefined   Cytology - Urine   Result Value Ref Range    Diagnosis       Urine for decoy cells, voided  - No malignant cells identified  - No decoy cells identified  - Hypocellular specimen    This electronic signature is attestation that the pathologist personally reviewed the submitted material(s) and the final diagnosis reflects that evaluation.      Gross Description            Urine voided decoy    90mL clear, yellow fluid, unfixed    Materials Prepared & Examined    Smear Slides.......0  Monolayers..........1  Cytospins.............0  Cell Blocks...........0  Core Biopsy.........0  Touch Prep..........0          Microscopic Description       Microscopic examination substantiates the above diagnosis.    Resident Physician: None Assigned      EMBEDDED IMAGES      Specimen Adequacy Satisfactory for evaluation     Disclaimer       Unless otherwise specified, specimens are preserved using 10% neutral buffered formalin. For cases in which immunohistochemical and/or in-situ hybridization stains are performed, the following statement applies: Appropriate controls for each stain (positive controls with or without negative controls) have been evaluated and stain as expected. These stains have not been separately validated for use on decalcified specimens and should be interpreted with caution in that setting. Some of the reagents used for these stains may be classified as analyte specific reagents (ASR). Tests using ASRs were developed, and their performance characteristics were determined, by the Anatomic Pathology Department [facility]). They have not been cleared or approved by the [place] Food and Drug Administration (FDA). The FDA does not require these tests to go through premarket FDA review. These tests are used for clinical purposes. They should not be regarded as investigational or for research. This laboratory is certified under the Clinical Laboratory Improvement Amendments (CLIA) as qualified to perform high complexity clinical laboratory testing.       *Note: Due to a large number of results and/or encounters for the requested time period, some results have not been displayed. A complete set of results can be found in Results Review.     Lab Results   Component Value Date    WBC 2.9 (L) 10/11/2020    RBC 4.53 10/11/2020    HGB 11.9 (L) 10/11/2020    HCT 38.0 (L) 10/11/2020    MCV 83.9 10/11/2020    MCH 26.3 10/11/2020    MCHC 31.3 10/11/2020    RDW 16.4 (H) 10/11/2020    PLT 170 10/11/2020     Lab Results   Component Value Date    NA 137 10/11/2020    K 5.2 (H) 10/11/2020    CL 107 10/11/2020    CO2 24 10/11/2020    BUN 28 (H) 10/11/2020    CREATININE 1.82 (H) 10/11/2020    GLU 111 (H) 10/11/2020    CALCIUM 9.6 10/11/2020    ALBUMIN 4.6 08/09/2020    PHOS 3.3 10/11/2020  Assessment and Plan    1. Status post second renal transplant. His creatinine today is 1.82, which is within his most recent baseline. Had wanted to biopsy him since his baseline is on the higher side,but he has no showed and cancelled multiple biopsies. Will hold off for now as long as creatinine stable. His tacrolimus level was not sent today as he already took his dose. His goal is 6-8. Will continue current immunosuppression. Have talked extensively about the importance of labs.     Regarding the nerve pain over the transplant, his exam and labs are reassuring. UA unremarkable. Discussed that he may have intermittent symptoms like this as his nerves regrow at the incision site.    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 Did see audiology and was found to have stable hearing loss. Recommended being fitted for right hearing aid.     3. Hypertension. Does not measure blood pressure at home, is 123/83 today. Continue amlodipine.    4. Hyperkalemia. K mildly elevated at 5.2. Is not taking Lokelma right now but has to use prn. Has been counseled on a low potassium diet.    5. Hypomagnesemia. Mg 1.6 today off supplementation.    6. Hypercalcemia. Calcium improved to 9.6 from 11.2 on Sensipar.    7. Metabolic acidosis. CO2 normal at 24, not on supplementation.    8. Hyperlipidemia. Triglycerides have been elevated (381 01/25/20 from 415 11/17/19), did some dietary counseling. May need to treat if remain high. Will get lipid panel at next visit.    9. Health maintenance. Received the flu shot 03/23/20, has not received pneumococcal vaccines. Received third dose of COVID vaccine in clinic today 10/11/20. Will check spike IgG Ab next visit to see if he qualifies for Evusheld.     We discussed that while we continue to recommend the COVID vaccine for all eligible patients, we know that kidney transplant patients do not respond as strongly due to their immunosuppression, so he should continue to follow the CDC guidelines for patients who are not vaccinated. I would also encourage all household members and close contacts that are eligible for vaccination to get vaccinated. We discussed the importance of ongoing social distancing, wearing a mask outside the home, and hand washing/sanitizing.     10. Will see patient back in 4 months, or sooner if needed. Hopefully will get labs in the meantime.

## 2020-10-12 NOTE — Unmapped (Signed)
Received a VM from Onalee Hua at [facility] on 4/28 @9 :27pm. Onalee Hua was requesting that another lab order be faxed in due to the prior faxed lab being illegible. re-faxed another lab order to requesting lab @ [phone].

## 2020-10-14 LAB — TACROLIMUS LEVEL: Tacrolimus (FK506) - LabCorp: 7.2 ng/mL (ref 2.0–20.0)

## 2020-10-30 DIAGNOSIS — Z940 Kidney transplant status: Principal | ICD-10-CM

## 2020-10-30 DIAGNOSIS — D849 Immunodeficiency, unspecified: Principal | ICD-10-CM

## 2020-10-30 MED ORDER — ENVARSUS XR 4 MG TABLET,EXTENDED RELEASE
ORAL_TABLET | Freq: Every day | ORAL | 3 refills | 30 days
Start: 2020-10-30 — End: ?

## 2020-10-30 NOTE — Unmapped (Signed)
[facility] Refill Coordination Note    Specialty Medication(s) to be Shipped:   Transplant: Envarsus 1mg , Envarsus 4mg  and Myfortic 180mg     Other medication(s) to be shipped: apap, amlodipine, carvedilol, cinacalcet, sod bicarb     Russell Wade, DOB: 12-Feb-1986  Phone: [phone] (home)       All above HIPAA information was verified with patient.     Was a Nurse, learning disability used for this call? No    Completed refill call assessment today to schedule patient's medication shipment from the [facility] [phone]).  All relevant notes have been reviewed.     Specialty medication(s) and dose(s) confirmed: Regimen is correct and unchanged.   Changes to medications: Cadel reports no changes at this time.  Changes to insurance: No  New side effects reported not previously addressed with a pharmacist or physician: None reported  Questions for the pharmacist: No    Confirmed patient received a Conservation officer, historic buildings and a Surveyor, mining with first shipment. The patient will receive a drug information handout for each medication shipped and additional FDA Medication Guides as required.       DISEASE/MEDICATION-SPECIFIC INFORMATION        N/A    SPECIALTY MEDICATION ADHERENCE     Medication Adherence    Patient reported X missed doses in the last month: 0  Specialty Medication: Envarsus 1mg   Patient is on additional specialty medications: Yes  Additional Specialty Medications: Envarsus 4mg   Patient Reported Additional Medication X Missed Doses in the Last Month: 0  Patient is on more than two specialty medications: Yes  Specialty Medication: Myfortic 180mg   Patient Reported Additional Medication X Missed Doses in the Last Month: 0              Were doses missed due to medication being on hold? No    Envarsus Xr 1 mg: 7 days of medicine on hand   Envarsus Xr 4 mg: 7 days of medicine on hand   Myfortic 180 mg: 7 days of medicine on hand       REFERRAL TO PHARMACIST     Referral to the pharmacist: Not needed      [facility]     Shipping address confirmed in Epic.     Delivery Scheduled: Yes, Expected medication delivery date: 11/02/20.     Medication will be delivered via UPS to the prescription address in Epic [facility].    Tera Helper   [facility] Specialty Pharmacist

## 2020-11-01 DIAGNOSIS — D849 Immunodeficiency, unspecified: Principal | ICD-10-CM

## 2020-11-01 DIAGNOSIS — Z940 Kidney transplant status: Principal | ICD-10-CM

## 2020-11-01 MED ORDER — TACROLIMUS XR 4 MG TABLET,EXTENDED RELEASE 24 HR
ORAL_TABLET | Freq: Every day | ORAL | 3 refills | 30 days | Status: CP
Start: 2020-11-01 — End: ?
  Filled 2020-11-02: qty 120, 30d supply, fill #0

## 2020-11-01 NOTE — Unmapped (Signed)
Russell Wade 's Envarsus XR 4mg  tablet shipment will be delayed as a result of no refills remain on the prescription.      I have reached out to the patient  at [phone] and was unable to leave a message. We will call the patient back to reschedule the delivery upon resolution. We have not confirmed the new delivery date.

## 2020-11-02 MED FILL — ENVARSUS XR 1 MG TABLET,EXTENDED RELEASE: ORAL | 30 days supply | Qty: 60 | Fill #1

## 2020-11-02 MED FILL — MYFORTIC 180 MG TABLET,DELAYED RELEASE: ORAL | 30 days supply | Qty: 120 | Fill #7

## 2020-11-02 NOTE — Unmapped (Signed)
Russell Wade 's entire shipment will be delayed as a result of no refills remained on the prescription, but it has now been received.     I have reached out to the patient  at [phone] and communicated the delivery change. We will reschedule the medication for the delivery date that the patient agreed upon.  We have confirmed the delivery date as 11/05/2020, via ups.     Patient confirmed that he will not miss any doses due to the delay. He said he has enough medication to get through the weekend and until his delivery arrives.

## 2020-11-05 MED FILL — AMLODIPINE 5 MG TABLET: ORAL | 30 days supply | Qty: 60 | Fill #8

## 2020-11-05 MED FILL — SODIUM BICARBONATE 650 MG TABLET: ORAL | 30 days supply | Qty: 120 | Fill #7

## 2020-11-05 MED FILL — ACETAMINOPHEN 500 MG TABLET: ORAL | 17 days supply | Qty: 100 | Fill #7

## 2020-11-05 MED FILL — CINACALCET 30 MG TABLET: ORAL | 30 days supply | Qty: 30 | Fill #7

## 2020-11-05 MED FILL — CARVEDILOL 6.25 MG TABLET: ORAL | 90 days supply | Qty: 180 | Fill #2

## 2020-11-28 NOTE — Unmapped (Signed)
[facility] Refill Coordination Note    Specialty Medication(s) to be Shipped:   Transplant: Envarsus 1mg , Envarsus 4mg  and Myfortic 180mg     Other medication(s) to be shipped: cinacalcet, sodium bicarb, amlodipine and tylenol     Russell Wade, DOB: 1986/03/22  Phone: [phone] (home)       All above HIPAA information was verified with patient.     Was a Nurse, learning disability used for this call? No    Completed refill call assessment today to schedule patient's medication shipment from the [facility] [phone]).  All relevant notes have been reviewed.     Specialty medication(s) and dose(s) confirmed: Regimen is correct and unchanged.   Changes to medications: Roark reports no changes at this time.  Changes to insurance: No  New side effects reported not previously addressed with a pharmacist or physician: None reported  Questions for the pharmacist: No    Confirmed patient received a Conservation officer, historic buildings and a Surveyor, mining with first shipment. The patient will receive a drug information handout for each medication shipped and additional FDA Medication Guides as required.       DISEASE/MEDICATION-SPECIFIC INFORMATION        N/A    SPECIALTY MEDICATION ADHERENCE     Medication Adherence    Patient reported X missed doses in the last month: 1  Specialty Medication: Envarsus 1mg   Patient is on additional specialty medications: Yes  Additional Specialty Medications: Envarsus 4mg   Patient Reported Additional Medication X Missed Doses in the Last Month: 1  Patient is on more than two specialty medications: Yes  Specialty Medication: Myfortic 180mg   Patient Reported Additional Medication X Missed Doses in the Last Month: 1        Were doses missed due to medication being on hold? No    Envarsus 1 mg: 4 days of medicine on hand   Envarsus 4 mg: 4 days of medicine on hand   Myfortic 180 mg: 4 days of medicine on hand     REFERRAL TO PHARMACIST     Referral to the pharmacist: Not needed      [facility]     Shipping address confirmed in Epic.     Delivery Scheduled: Yes, Expected medication delivery date: 11/30/2020.     Medication will be delivered via UPS to the prescription address in Epic WAM.    Lorelei Pont [facility] Specialty Technician

## 2020-11-29 MED FILL — CINACALCET 30 MG TABLET: ORAL | 30 days supply | Qty: 30 | Fill #8

## 2020-11-29 MED FILL — MYFORTIC 180 MG TABLET,DELAYED RELEASE: ORAL | 30 days supply | Qty: 120 | Fill #8

## 2020-11-29 MED FILL — AMLODIPINE 5 MG TABLET: ORAL | 30 days supply | Qty: 60 | Fill #9

## 2020-11-29 MED FILL — ACETAMINOPHEN 500 MG TABLET: ORAL | 17 days supply | Qty: 100 | Fill #8

## 2020-11-29 MED FILL — ENVARSUS XR 4 MG TABLET,EXTENDED RELEASE: ORAL | 30 days supply | Qty: 120 | Fill #1

## 2020-11-29 MED FILL — SODIUM BICARBONATE 650 MG TABLET: ORAL | 30 days supply | Qty: 120 | Fill #8

## 2020-11-29 MED FILL — ENVARSUS XR 1 MG TABLET,EXTENDED RELEASE: ORAL | 30 days supply | Qty: 60 | Fill #2

## 2020-12-31 NOTE — Unmapped (Signed)
[facility] Refill Coordination Note    Specialty Medication(s) to be Shipped:   Transplant: Envarsus 1mg , Envarsus 4mg , Myfortic 180mg  and Sensipar 30mg     Other medication(s) to be shipped: amlodipine, sodium bicarbonate     Russell Wade, DOB: 1986/02/22  Phone: [phone] (home)       All above HIPAA information was verified with patient.     Was a Nurse, learning disability used for this call? No    Completed refill call assessment today to schedule patient's medication shipment from the [facility] [phone]).  All relevant notes have been reviewed.     Specialty medication(s) and dose(s) confirmed: Regimen is correct and unchanged.   Changes to medications: Gerritt reports no changes at this time.  Changes to insurance: No  New side effects reported not previously addressed with a pharmacist or physician: None reported  Questions for the pharmacist: No    Confirmed patient received a Conservation officer, historic buildings and a Surveyor, mining with first shipment. The patient will receive a drug information handout for each medication shipped and additional FDA Medication Guides as required.       DISEASE/MEDICATION-SPECIFIC INFORMATION        N/A    SPECIALTY MEDICATION ADHERENCE     Medication Adherence    Patient reported X missed doses in the last month: 0  Specialty Medication: Envarsus 1mg   Patient is on additional specialty medications: Yes  Additional Specialty Medications: Envarsus 4mg   Patient Reported Additional Medication X Missed Doses in the Last Month: 0  Patient is on more than two specialty medications: Yes  Specialty Medication: Cinacalcet  Patient Reported Additional Medication X Missed Doses in the Last Month: 0  Specialty Medication: Myfortic 180mg   Patient Reported Additional Medication X Missed Doses in the Last Month: 0  Any gaps in refill history greater than 2 weeks in the last 3 months: no  Demonstrates understanding of importance of adherence: yes  Informant: patient              Were doses missed due to medication being on hold? No    Envarsus 1mg : Patient has 4 days of medication on hand  Envarsus 4mg : Patient has 4 days of medication on hand  Myfortic 180mg : Patient has 4 days of medication on hand  Sensipar 100mg : Patient has 4 days of medication on hand    REFERRAL TO PHARMACIST     Referral to the pharmacist: Not needed      [facility]     Shipping address confirmed in Epic.     Delivery Scheduled: Yes, Expected medication delivery date: 7/21.     Medication will be delivered via UPS to the prescription address in Epic WAM.    Olga Millers   [facility] Specialty Technician

## 2021-01-02 MED FILL — CINACALCET 30 MG TABLET: ORAL | 30 days supply | Qty: 30 | Fill #9

## 2021-01-02 MED FILL — AMLODIPINE 5 MG TABLET: ORAL | 30 days supply | Qty: 60 | Fill #10

## 2021-01-02 MED FILL — MYFORTIC 180 MG TABLET,DELAYED RELEASE: ORAL | 30 days supply | Qty: 120 | Fill #9

## 2021-01-02 MED FILL — ENVARSUS XR 1 MG TABLET,EXTENDED RELEASE: ORAL | 30 days supply | Qty: 60 | Fill #3

## 2021-01-02 MED FILL — SODIUM BICARBONATE 650 MG TABLET: ORAL | 30 days supply | Qty: 120 | Fill #9

## 2021-01-02 MED FILL — ENVARSUS XR 4 MG TABLET,EXTENDED RELEASE: ORAL | 30 days supply | Qty: 120 | Fill #2

## 2021-01-22 MED FILL — GABAPENTIN 100 MG CAPSULE: ORAL | 90 days supply | Qty: 270 | Fill #1

## 2021-01-22 MED FILL — ACETAMINOPHEN 500 MG TABLET: ORAL | 17 days supply | Qty: 100 | Fill #9

## 2021-01-22 NOTE — Unmapped (Signed)
[facility] Clinical Assessment & Refill Coordination Note    Russell Wade, DOB: Sep 10, 1985  Phone: [phone] (home)     All above HIPAA information was verified with patient.     Was a Nurse, learning disability used for this call? No    Specialty Medication(s):   Transplant: Envarsus 1mg , Envarsus 4mg  and Myfortic 180mg      Current Outpatient Medications   Medication Sig Dispense Refill   ??? acetaminophen (TYLENOL) 500 MG tablet Take 1-2 tablets (500-1,000 mg total) by mouth every eight (8) hours as needed for pain or fever (> 38C). Max dose 3000 mg per day 100 tablet 11   ??? amLODIPine (NORVASC) 5 MG tablet Take 2 tablets (10 mg total) by mouth daily. 60 tablet 11   ??? carvediloL (COREG) 6.25 MG tablet TAKE 1 TABLET (6.25 MG TOTAL) BY MOUTH TWO (2) TIMES A DAY. 180 tablet 3   ??? cinacalcet (SENSIPAR) 30 MG tablet Take 1 tablet (30 mg total) by mouth daily. 30 tablet 11   ??? gabapentin (NEURONTIN) 100 MG capsule Take 1 capsule (100 mg total) by mouth Three (3) times a day as needed. 270 capsule 3   ??? mycophenolate (MYFORTIC) 180 MG EC tablet Take 2 tablets (360 mg total) by mouth Two (2) times a day. 120 tablet 11   ??? sodium bicarbonate 650 mg tablet Take 2 tablets (1,300 mg total) by mouth Two (2) times a day. 120 tablet 11   ??? sodium zirconium cyclosilicate (LOKELMA) 5 gram PwPk packet Take 2 packets (10 g total) by mouth two (2) times a day as needed. (Patient not taking: Reported on 11/17/2019)     ??? tacrolimus (ENVARSUS XR) 1 mg Tb24 extended release tablet Take 2 tablets (2 mg total) by mouth daily with 4 (4 mg) tablets for a total dose of 18 mg daily 60 tablet 11   ??? tacrolimus (ENVARSUS XR) 4 mg Tb24 extended release tablet Take 4 tablets (16 mg total) by mouth daily with 2 (1mg ) tablets for a total dose of 18 mg daily 120 tablet 3   ??? zolpidem (AMBIEN) 10 mg tablet Take 10 mg by mouth nightly as needed for sleep. (Patient not taking: Reported on 02/22/2020)       No current facility-administered medications for this visit.        Changes to medications: Ronav reports no changes at this time.    Allergies   Allergen Reactions   ??? Tuberculin Ppd      Other reaction(s): Skin Rash       Changes to allergies: No    SPECIALTY MEDICATION ADHERENCE     Envarsus Xr 1 mg: 10 days of medicine on hand   Envarsus Xr 4 mg: 10 days of medicine on hand   Myfortic 180 mg: 10 days of medicine on hand      Medication Adherence    Patient reported X missed doses in the last month: 0  Specialty Medication: Envarsus Xr 1mg   Patient is on additional specialty medications: Yes  Additional Specialty Medications: Envarsus Xr 4mg   Patient Reported Additional Medication X Missed Doses in the Last Month: 0  Patient is on more than two specialty medications: Yes  Specialty Medication: Myfortic 180mg   Patient Reported Additional Medication X Missed Doses in the Last Month: 0          Specialty medication(s) dose(s) confirmed: Regimen is correct and unchanged.     Are there any concerns with adherence? No  Adherence counseling provided? Not needed    CLINICAL MANAGEMENT AND INTERVENTION      Clinical Benefit Assessment:    Do you feel the medicine is effective or helping your condition? Yes    Clinical Benefit counseling provided? Not needed    Adverse Effects Assessment:    Are you experiencing any side effects? No    Are you experiencing difficulty administering your medicine? No    Quality of Life Assessment:         How many days over the past month did your kidney transplant  keep you from your normal activities? For example, brushing your teeth or getting up in the morning. 0    Have you discussed this with your provider? Not needed    Acute Infection Status:    Acute infections noted within Epic:  No active infections  Patient reported infection: None    Therapy Appropriateness:    Is therapy appropriate? Yes, therapy is appropriate and should be continued    DISEASE/MEDICATION-SPECIFIC INFORMATION N/A    PATIENT SPECIFIC NEEDS     - Does the patient have any physical, cognitive, or cultural barriers? No    - Is the patient high risk? Yes, patient is taking a REMS drug. Medication is dispensed in compliance with REMS program    - Does the patient require a Care Management Plan? No     - Does the patient require physician intervention or other additional services (i.e. nutrition, smoking cessation, social work)? No      SHIPPING     Specialty Medication(s) to be Shipped:   Transplant: Envarsus 1mg , Envarsus 4mg  and Myfortic 180mg     Other medication(s) to be shipped: amlodipine, cinacalcet, sodium bicarb, carvedilol     Changes to insurance: No    Delivery Scheduled: Yes, Expected medication delivery date: 01/30/21.     Medication will be delivered via UPS to the confirmed prescription address in [facility].    The patient will receive a drug information handout for each medication shipped and additional FDA Medication Guides as required.  Verified that patient has previously received a Conservation officer, historic buildings and a Surveyor, mining.    The patient or caregiver noted above participated in the development of this care plan and knows that they can request review of or adjustments to the care plan at any time.      All of the patient's questions and concerns have been addressed.    Tera Helper   [facility] Specialty Pharmacist

## 2021-01-23 DIAGNOSIS — Z940 Kidney transplant status: Principal | ICD-10-CM

## 2021-01-25 ENCOUNTER — Other Ambulatory Visit (HOSPITAL_COMMUNITY)
Admission: AD | Admit: 2021-01-25 | Discharge: 2021-01-25 | Disposition: A | Discharge status: Home / Self Care | Payer: Medicare Other | Source: Ambulatory Visit

## 2021-01-25 DIAGNOSIS — Z029 Encounter for administrative examinations, unspecified: Secondary | ICD-10-CM | POA: Diagnosis present

## 2021-01-25 LAB — BASIC METABOLIC PANEL
Anion gap: 5 (ref 5–15)
BUN: 20 mg/dL (ref 6–20)
CO2: 25 mmol/L (ref 22–32)
Calcium: 10.4 mg/dL — ABNORMAL HIGH (ref 8.9–10.3)
Chloride: 107 mmol/L (ref 98–111)
Creatinine, Ser: 2.09 mg/dL — ABNORMAL HIGH (ref 0.61–1.24)
GFR, Estimated: 42 mL/min — ABNORMAL LOW (ref >60)
Glucose, Bld: 104 mg/dL — ABNORMAL HIGH (ref 70–99)
Potassium: 6.1 mmol/L — ABNORMAL HIGH (ref 3.5–5.1)
Sodium: 137 mmol/L (ref 135–145)

## 2021-01-25 LAB — CBC WITH DIFFERENTIAL/PLATELET
Abs Immature Granulocytes: 0.44 10*3/uL — ABNORMAL HIGH (ref 0.00–0.07)
Basophils Absolute: 0 10*3/uL (ref 0.0–0.1)
Basophils Relative: 1 %
Eosinophils Absolute: 0.1 10*3/uL (ref 0.0–0.5)
Eosinophils Relative: 3 %
HCT: 39.9 % (ref 39.0–52.0)
Hemoglobin: 13 g/dL (ref 13.0–17.0)
Immature Granulocytes: 9 %
Lymphocytes Relative: 17 %
Lymphs Abs: 0.9 10*3/uL (ref 0.7–4.0)
MCH: 26.6 pg (ref 26.0–34.0)
MCHC: 32.6 g/dL (ref 30.0–36.0)
MCV: 81.8 fL (ref 80.0–100.0)
Monocytes Absolute: 0.5 10*3/uL (ref 0.1–1.0)
Monocytes Relative: 10 %
Neutro Abs: 3.1 10*3/uL (ref 1.7–7.7)
Neutrophils Relative %: 60 %
Platelets: 181 10*3/uL (ref 150–400)
RBC: 4.88 MIL/uL (ref 4.22–5.81)
RDW: 15.3 % (ref 11.5–15.5)
WBC: 5.1 10*3/uL (ref 4.0–10.5)
nRBC: 0 % (ref 0.0–0.2)

## 2021-01-25 LAB — MAGNESIUM: Magnesium: 1.6 mg/dL — ABNORMAL LOW (ref 1.7–2.4)

## 2021-01-25 LAB — PHOSPHORUS: Phosphorus: 2.5 mg/dL (ref 2.5–4.6)

## 2021-01-28 DIAGNOSIS — E875 Hyperkalemia: Principal | ICD-10-CM

## 2021-01-28 DIAGNOSIS — Z940 Kidney transplant status: Principal | ICD-10-CM

## 2021-01-28 LAB — CBC W/ DIFFERENTIAL
BASOPHILS ABSOLUTE COUNT: 0 10*3/uL (ref 0.0–0.1)
BASOPHILS RELATIVE PERCENT: 1 %
EOSINOPHILS ABSOLUTE COUNT: 0.1 10*3/uL (ref 0.0–0.5)
EOSINOPHILS RELATIVE PERCENT: 3 %
HEMATOCRIT: 39.9 (ref 39.0–52.0)
HEMOGLOBIN: 13 g/dL (ref 13.0–17.0)
IMMATURE CELLS: 9
LYMPHOCYTES ABSOLUTE COUNT: 0.9 10*3/uL (ref 0.7–4.0)
LYMPHOCYTES RELATIVE PERCENT: 17 %
MEAN CORPUSCULAR HEMOGLOBIN CONC: 32.6 g/dL (ref 30.0–36.0)
MEAN CORPUSCULAR HEMOGLOBIN: 26.6 % (ref 26.0–34.0)
MEAN CORPUSCULAR VOLUME: 81.8 fL (ref 80.0–100.0)
MONOCYTES ABSOLUTE COUNT: 0.5
MONOCYTES RELATIVE PERCENT: 10 %
NEUTROPHILS ABSOLUTE COUNT: 3.1
NEUTROPHILS RELATIVE PERCENT: 60 %
NUCLEATED RED BLOOD CELLS: 0 (ref 0.0–0.2)
PLATELET COUNT: 181 10*3/uL (ref 150–400)
RED BLOOD CELL COUNT: 4.88
RED CELL DISTRIBUTION WIDTH: 15.3 % (ref 11.5–15.5)
WHITE BLOOD CELL COUNT: 5.1 10*3/uL (ref 4.0–10.5)

## 2021-01-28 LAB — BASIC METABOLIC PANEL
ANION GAP: 5 (ref 5–15)
BLOOD UREA NITROGEN: 20 mg/dL (ref 6–20)
CALCIUM: 10.4 — ABNORMAL HIGH (ref 8.9–10.3)
CHLORIDE: 107 mmol/L (ref 98–111)
CO2: 25 mmol/L (ref 22–32)
CREATININE: 2.09 mg/dL — ABNORMAL HIGH (ref 0.61–1.24)
EGFR CKD-EPI AA MALE: 42 mL/min — ABNORMAL LOW
GLUCOSE RANDOM: 104 mg/dL — ABNORMAL HIGH (ref 70–99)
MAGNESIUM: 1.6 mg/dL — ABNORMAL LOW (ref 1.7–2.4)
PHOSPHORUS: 2.5 mg/dL (ref 2.5–4.6)
POTASSIUM: 6.1 mmol/L — ABNORMAL HIGH (ref 3.5–5.1)
SODIUM: 137 mmol/L (ref 135–145)

## 2021-01-28 LAB — TACROLIMUS LEVEL: Tacrolimus (FK506) - LabCorp: 10.1 ng/mL (ref 2.0–20.0)

## 2021-01-28 MED ORDER — SODIUM ZIRCONIUM CYCLOSILICATE 5 GRAM ORAL POWDER PACKET
PACK | Freq: Three times a day (TID) | ORAL | 11 refills | 30.00000 days | Status: CP
Start: 2021-01-28 — End: 2022-01-28

## 2021-01-28 NOTE — Unmapped (Addendum)
Potassium level 6.1, patient advised to take Lokelma 10 mg TID and repeat labs on Thursday per Dr Carlene Coria. He said he will repeat  Friday due to prior commitment on that day. I emphasized the importance of repeating labs Thursday or Friday. Pt verbalize understanding.

## 2021-01-29 MED ORDER — CARVEDILOL 6.25 MG TABLET
ORAL_TABLET | Freq: Two times a day (BID) | ORAL | 3 refills | 90 days | Status: CP
Start: 2021-01-29 — End: 2022-01-29
  Filled 2021-01-29: qty 180, 90d supply, fill #0

## 2021-01-29 MED FILL — AMLODIPINE 5 MG TABLET: ORAL | 30 days supply | Qty: 60 | Fill #11

## 2021-01-29 MED FILL — SODIUM BICARBONATE 650 MG TABLET: ORAL | 30 days supply | Qty: 120 | Fill #10

## 2021-01-29 MED FILL — ENVARSUS XR 1 MG TABLET,EXTENDED RELEASE: ORAL | 30 days supply | Qty: 60 | Fill #4

## 2021-01-29 MED FILL — MYFORTIC 180 MG TABLET,DELAYED RELEASE: ORAL | 30 days supply | Qty: 120 | Fill #10

## 2021-01-29 MED FILL — ENVARSUS XR 4 MG TABLET,EXTENDED RELEASE: ORAL | 30 days supply | Qty: 120 | Fill #3

## 2021-01-29 MED FILL — CINACALCET 30 MG TABLET: ORAL | 30 days supply | Qty: 30 | Fill #10

## 2021-01-29 NOTE — Unmapped (Signed)
Tacrolimus level 10.1 reviewed with Dr Carlene Coria. Will wait for repeat labs on Friday.

## 2021-02-01 DIAGNOSIS — Z79899 Other long term (current) drug therapy: Principal | ICD-10-CM

## 2021-02-01 DIAGNOSIS — Z940 Kidney transplant status: Principal | ICD-10-CM

## 2021-02-07 NOTE — Unmapped (Signed)
Called patient to repeat labs, unable to leave a message VM box full. Sent [employer].

## 2021-02-20 DIAGNOSIS — D849 Immunodeficiency, unspecified: Principal | ICD-10-CM

## 2021-02-20 DIAGNOSIS — Z940 Kidney transplant status: Principal | ICD-10-CM

## 2021-02-20 MED ORDER — AMLODIPINE 5 MG TABLET
ORAL_TABLET | Freq: Every day | ORAL | 11 refills | 30.00000 days
Start: 2021-02-20 — End: 2022-02-20

## 2021-02-20 MED ORDER — ENVARSUS XR 4 MG TABLET,EXTENDED RELEASE
ORAL_TABLET | Freq: Every day | ORAL | 3 refills | 30 days
Start: 2021-02-20 — End: ?

## 2021-02-20 NOTE — Unmapped (Signed)
Pt request for RX Refill amLODIPine (NORVASC) 5 MG tablet

## 2021-02-20 NOTE — Unmapped (Signed)
[facility] Refill Coordination Note    Specialty Medication(s) to be Shipped:   Transplant: Envarsus 1mg , Envarsus 4mg  and Myfortic 180mg     Other medication(s) to be shipped:     Tylenol  Amlodipine  sensipar  Sodium bicarb       Russell Wade, DOB: 18-Oct-1985  Phone: [phone] (home)       All above HIPAA information was verified with patient.     Was a Nurse, learning disability used for this call? No    Completed refill call assessment today to schedule patient's medication shipment from the [facility] [phone]).  All relevant notes have been reviewed.     Specialty medication(s) and dose(s) confirmed: Regimen is correct and unchanged.   Changes to medications: Russell Wade reports no changes at this time.  Changes to insurance: No  New side effects reported not previously addressed with a pharmacist or physician: None reported  Questions for the pharmacist: No    Confirmed patient received a Conservation officer, historic buildings and a Surveyor, mining with first shipment. The patient will receive a drug information handout for each medication shipped and additional FDA Medication Guides as required.       DISEASE/MEDICATION-SPECIFIC INFORMATION        N/A    SPECIALTY MEDICATION ADHERENCE     Medication Adherence    Patient reported X missed doses in the last month: 0  Specialty Medication: tacrolimus (ENVARSUS XR) 1 mg Tb24 extended release tablet  Patient is on additional specialty medications: Yes  Additional Specialty Medications: tacrolimus (ENVARSUS XR) 4 mg Tb24 extended release tablet  Patient Reported Additional Medication X Missed Doses in the Last Month: 0  Patient is on more than two specialty medications: Yes  Specialty Medication: mycophenolate (MYFORTIC) 180 MG EC tablet  Patient Reported Additional Medication X Missed Doses in the Last Month: 0        Envarsus 1mg : Patient has 9 days of medication on hand   Envarsus 4mg : Patient has 9 days of medication on hand   Myfortic 180mg : Patient has 9 days of medication on hand        Were doses missed due to medication being on hold? No      REFERRAL TO PHARMACIST     Referral to the pharmacist: Not needed      [facility]     Shipping address confirmed in Epic.     Delivery Scheduled: Yes, Expected medication delivery date: 02/27/21.     Medication will be delivered via UPS to the prescription address in Epic WAM.    Russell Wade   [facility] Specialty Technician

## 2021-02-21 MED ORDER — AMLODIPINE 5 MG TABLET
ORAL_TABLET | Freq: Every day | ORAL | 11 refills | 30 days | Status: CP
Start: 2021-02-21 — End: 2022-02-21
  Filled 2021-02-26: qty 60, 30d supply, fill #0

## 2021-02-26 DIAGNOSIS — Z940 Kidney transplant status: Principal | ICD-10-CM

## 2021-02-26 DIAGNOSIS — D849 Immunodeficiency, unspecified: Principal | ICD-10-CM

## 2021-02-26 MED ORDER — TACROLIMUS XR 4 MG TABLET,EXTENDED RELEASE 24 HR
ORAL_TABLET | Freq: Every day | ORAL | 3 refills | 30 days | Status: CP
Start: 2021-02-26 — End: ?
  Filled 2021-02-28: qty 120, 30d supply, fill #0

## 2021-02-26 MED FILL — ACETAMINOPHEN 500 MG TABLET: ORAL | 17 days supply | Qty: 100 | Fill #10

## 2021-02-26 MED FILL — SODIUM BICARBONATE 650 MG TABLET: ORAL | 30 days supply | Qty: 120 | Fill #11

## 2021-02-26 MED FILL — CINACALCET 30 MG TABLET: ORAL | 30 days supply | Qty: 30 | Fill #11

## 2021-02-26 MED FILL — MYFORTIC 180 MG TABLET,DELAYED RELEASE: ORAL | 30 days supply | Qty: 120 | Fill #11

## 2021-02-26 MED FILL — ENVARSUS XR 1 MG TABLET,EXTENDED RELEASE: ORAL | 30 days supply | Qty: 60 | Fill #5

## 2021-02-26 NOTE — Unmapped (Signed)
Cherlynn Kaiser 's Envarsus XR 4mg  shipment will be delayed as a result of no refills remain on the prescription.      I have reached out to the patient  at [phone] and left a voicemail message.  We will wait for a call back from the patient to reschedule the delivery.  We have not confirmed the new delivery date.      Sending all other meds in case of an early cutoff.

## 2021-02-27 NOTE — Unmapped (Signed)
Russell Wade 's Envarsus XR 4mg  shipment will be delayed as a result of a new prescription for the medication has been received.      I have reached out to the patient  at [phone] and left a voicemail message.  We will wait for a call back from the patient to reschedule the delivery.  We have not confirmed the new delivery date.

## 2021-02-28 NOTE — Unmapped (Signed)
Russell Wade 's Envarsus XR 4mg  shipment will be sent out  as a result of a new prescription for the medication has been received.      I have reached out to the patient  at [phone] and communicated the delivery change. We will reschedule the medication for the delivery date that the patient agreed upon.  We have confirmed the delivery date as 03/01/2021, via ups.

## 2021-03-11 ENCOUNTER — Ambulatory Visit: Admit: 2021-03-11 | Discharge: 2021-03-11 | Payer: MEDICARE

## 2021-03-19 DIAGNOSIS — Z940 Kidney transplant status: Principal | ICD-10-CM

## 2021-03-20 ENCOUNTER — Ambulatory Visit: Admit: 2021-03-20 | Payer: MEDICARE | Attending: Nephrology | Primary: Nephrology

## 2021-03-20 ENCOUNTER — Ambulatory Visit: Admit: 2021-03-20 | Payer: MEDICARE

## 2021-03-20 DIAGNOSIS — Z940 Kidney transplant status: Principal | ICD-10-CM

## 2021-03-20 MED ORDER — SODIUM BICARBONATE 650 MG TABLET
ORAL_TABLET | Freq: Two times a day (BID) | ORAL | 11 refills | 30 days
Start: 2021-03-20 — End: 2022-03-20

## 2021-03-20 MED ORDER — CINACALCET 30 MG TABLET
ORAL_TABLET | Freq: Every day | ORAL | 11 refills | 30 days
Start: 2021-03-20 — End: 2022-03-20

## 2021-03-20 MED ORDER — MYCOPHENOLATE SODIUM 180 MG TABLET,DELAYED RELEASE
ORAL_TABLET | Freq: Two times a day (BID) | ORAL | 11 refills | 30 days
Start: 2021-03-20 — End: 2022-03-20

## 2021-03-20 NOTE — Unmapped (Signed)
[facility] Refill Coordination Note    Specialty Medication(s) to be Shipped:   Transplant: Envarsus 4mg , Envarsus 1mg  and mycophenolate mofetil 180mg     Other medication(s) to be shipped: acetaminophen 500 MG - amLODIPine 5 MG - cinacalcet 30 MG - sodium bicarbonate 650 mg     Russell Wade, DOB: Sep 08, 1985  Phone: [phone] (home)       All above HIPAA information was verified with patient.     Was a Nurse, learning disability used for this call? No    Completed refill call assessment today to schedule patient's medication shipment from the [facility] [phone]).  All relevant notes have been reviewed.     Specialty medication(s) and dose(s) confirmed: Regimen is correct and unchanged.   Changes to medications: Demont reports no changes at this time.  Changes to insurance: No  New side effects reported not previously addressed with a pharmacist or physician: None reported  Questions for the pharmacist: No    Confirmed patient received a Conservation officer, historic buildings and a Surveyor, mining with first shipment. The patient will receive a drug information handout for each medication shipped and additional FDA Medication Guides as required.       DISEASE/MEDICATION-SPECIFIC INFORMATION        N/A    SPECIALTY MEDICATION ADHERENCE     Medication Adherence    Patient reported X missed doses in the last month: 1  Specialty Medication: ENVARSUS XR 4 mg Tb24 extended release tablet (tacrolimus)  Patient is on additional specialty medications: Yes  Additional Specialty Medications: ENVARSUS XR 1 mg Tb24 extended release tablet (tacrolimus)  Patient Reported Additional Medication X Missed Doses in the Last Month: 1  Patient is on more than two specialty medications: Yes  Specialty Medication: MYFORTIC 180 MG EC tablet (mycophenolate)  Patient Reported Additional Medication X Missed Doses in the Last Month: 1              Were doses missed due to medication being on hold? No    ENVARSUS XR 4 mg :10  days of medicine on hand   ENVARSUS XR 1 mg : 10 days of medicine on hand   MYFORTIC 180 MG : 8 days of medicine on hand       REFERRAL TO PHARMACIST     Referral to the pharmacist: Not needed      [facility]     Shipping address confirmed in Epic.     Delivery Scheduled: Yes, Expected medication delivery date: 03/28/21.  However, Rx request for refills was sent to the provider as there are none remaining.     Medication will be delivered via UPS to the prescription address in Epic WAM.    Yolonda Kida   [facility] Specialty Technician

## 2021-03-21 MED ORDER — CINACALCET 30 MG TABLET
ORAL_TABLET | Freq: Every day | ORAL | 11 refills | 30.00000 days | Status: CP
Start: 2021-03-21 — End: 2022-03-21
  Filled 2021-03-27: qty 30, 30d supply, fill #0

## 2021-03-21 MED ORDER — MYCOPHENOLATE SODIUM 180 MG TABLET,DELAYED RELEASE
ORAL_TABLET | Freq: Two times a day (BID) | ORAL | 11 refills | 30 days | Status: CP
Start: 2021-03-21 — End: 2022-03-21
  Filled 2021-03-27: qty 120, 30d supply, fill #0

## 2021-03-21 MED ORDER — SODIUM BICARBONATE 650 MG TABLET
ORAL_TABLET | Freq: Two times a day (BID) | ORAL | 11 refills | 30 days | Status: CP
Start: 2021-03-21 — End: 2022-03-21
  Filled 2021-03-27: qty 120, 30d supply, fill #0

## 2021-03-21 NOTE — Unmapped (Signed)
The patient is requesting a medication refill

## 2021-03-26 NOTE — Unmapped (Signed)
Complex Case Management Pre-Assessment Note  Pre-Assessment  NOTE      Summary:  Care Coordinator spoke with patient and verified correct patient using two identifiers today for enrollment in Complex Case Management. Informed patient of Complex Case Management services. Pt has agreed to participate in the Complex Case Management program. Case Manager contact information was provided to patient. Care Coordinator scheduled a time for Case Manager to call patient to complete initial Assessment. Assessment Scheduled for : 04/04/21 at 11:00am     General Case management Questions:     General Care Management - Patient Level    Assessment completed with: patient[KH1.1]  Patient lives with: friends (Comment: lives with mother of his child but they are not romantically involved)[KH1.1]  Support system: parent, other family members, partner (Comment: family members and childs mother will provide emotional,transportation and financial support)[KH1.1]  Type of residence: apartment[KH1.1]  DME used at home: BP cuff, scale[KH1.1]  Transportation means: personal vehicle[KH1.1]  Does your health interfere with activities of daily living?: sleep[KH1.1]  Exercise: currently not exercising[KH1.1]  Follow special diet?: regular (Comment: not eating grapefruit due to transplant restriction)[KH1.1]  Interested in seeing dietician?: Yes (Comment: having a balanced diet with transplant)[KH1.1]  Experiencing side effects from current medications: No (Comment: not at this time)[KH1.1]  Interested in seeing pharmacist?: No (Comment: not at this time)[KH1.1]  Difficulty keeping appointments: No (Comment: not at this time)[KH1.1]  Need assistance with community resources?: Yes (Comment: housing resources in [place] coordination;food pantry resources)[KH1.2]  Other significant issues impacting care?: Kidney transplant in September 2020 still following up with transplant team, will need to schedule a follow  up with their office but has not been able to reach them via telephone per patient.   Appointment Coordination-he will need to follow up with Audiology as he currently has hearing impairment.   He is not established with a specific PCP [KH1.2]     Attribution     KH1.1 Sarita Bottom Gaile Allmon 03/26/21 11:12    KH1.2 Steffanie Rainwater 03/26/21 11:30           History Review:     Past Medical History:   Diagnosis Date   ??? Alport syndrome    ??? Anemia    ??? ESRD (end stage renal disease) (CMS-HCC)    ??? Financial difficulties    ??? Hearing impaired    ??? Hearing impairment    ??? History of transfusion    ??? Hypertension    ??? Nephrotic syndrome    ??? Renal transplant disorder    ??? Visual impairment      Caregiver burden No   Cognitive Impairment No   Falls Risk No   Financial difficulty Yes   Frail Elderly No   Hearing impairment/loss Yes   Homeless No   Impaired mobility No   Inadequate social/family support No   Ineffective family coping No   Low Literacy No   Nonadherence to medication No   Non-english speaking No   Terminal Illness/Hospice No   Transportation barriers No   Visual impairment Yes     Past Surgical History:   Procedure Laterality Date   ??? ABDOMINAL SURGERY     ??? AV FISTULA PLACEMENT Left 06/16/2002    left arm   ??? FRACTURE SURGERY     ??? 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 PR REMOVAL OF RECIPIENT KIDNEY Right 03/20/2014  Procedure: RECIPIENT NEPHRECTOMY (SEPARATE PROCEDURE);  Surgeon: Vivi Barrack, MD;  Location: [facility];  Service: Transplant   ??? PR TRANSPLANT,PREP RENAL GRAFT/ARTERIAL N/A 02/20/2019    Procedure: [facility] RECONSTRUCTION CADAVER/LIVING DONOR RENAL ALLOGRAFT PRIOR TO TRANSPLANT; ARTERIAL ANASTOMOSIS EAC;  Surgeon: Leona Carry, MD;  Location: MAIN OR [facility];  Service: Transplant   ??? PR TRANSPLANTATION OF KIDNEY Left 02/20/2019    Procedure: RENAL ALLOTRANSPLANTATION, IMPLANTATION OF GRAFT; WITHOUT RECIPIENT NEPHRECTOMY;  Surgeon: Leona Carry, MD;  Location: [facility];  Service: Transplant     Family History   Problem Relation Age of Onset   ??? Alport syndrome Cousin    ??? Kidney disease Mother    ??? Kidney disease Maternal Grandmother    ??? Kidney disease Maternal Aunt      Counseling given: Not Answered  Comment: Occasional historical cigars, previously quit cigarettes in 2014 (9 years history of pack/day)    Counseling given: Not Answered  Comment: Occasional historical cigars, previously quit cigarettes in 2014 (9 years history of pack/day)                     Social History     Substance and Sexual Activity   Drug Use No     Social History     Substance and Sexual Activity   Sexual Activity Not on file       Self-Efficacy Score:  SCORE: 9.17 (03/26/2021 11:23 AM)      Medications:   Outpatient Encounter Medications as of 03/26/2021   Medication Sig Dispense Refill   ??? acetaminophen (TYLENOL) 500 MG tablet Take 1-2 tablets (500-1,000 mg total) by mouth every eight (8) hours as needed for pain or fever (> 38C). Max dose 3000 mg per day 100 tablet 11   ??? amLODIPine (NORVASC) 5 MG tablet Take 2 tablets (10 mg total) by mouth daily. 60 tablet 11   ??? carvediloL (COREG) 6.25 MG tablet TAKE 1 TABLET (6.25 MG TOTAL) BY MOUTH TWO (2) TIMES A DAY. 180 tablet 3   ??? cinacalcet (SENSIPAR) 30 MG tablet Take 1 tablet (30 mg total) by mouth daily. 30 tablet 11   ??? gabapentin (NEURONTIN) 100 MG capsule Take 1 capsule (100 mg total) by mouth Three (3) times a day as needed. 270 capsule 3   ??? mycophenolate (MYFORTIC) 180 MG EC tablet Take 2 tablets (360 mg total) by mouth Two (2) times a day. 120 tablet 11   ??? sodium bicarbonate 650 mg tablet Take 2 tablets (1,300 mg total) by mouth Two (2) times a day. 120 tablet 11   ??? sodium zirconium cyclosilicate (LOKELMA) 5 gram PwPk packet Take 2 packets (10 g total) by mouth Three (3) times a day. 180 packet 11   ??? tacrolimus (ENVARSUS XR) 1 mg Tb24 extended release tablet Take 2 tablets (2 mg total) by mouth daily with 4 (4 mg) tablets for a total dose of 18 mg daily 60 tablet 11   ??? tacrolimus (ENVARSUS XR) 4 mg Tb24 extended release tablet Take 4 tablets (16 mg total) by mouth daily with 2 (1mg ) tablets for a total dose of 18 mg daily 120 tablet 3   ??? zolpidem (AMBIEN) 10 mg tablet Take 10 mg by mouth nightly as needed for sleep. (Patient not taking: Reported on 02/22/2020)     ??? [DISCONTINUED] sodium polystyrene sulfonate (SODIUM POLYSTYRENE) 15 gram/60 mL Susp Take 120 mL (30 g total) by mouth daily for 2 days. 240 mL 1  No facility-administered encounter medications on file as of 03/26/2021.        Social History Review:     Physicist, medical Strain: Medium Risk   ??? Difficulty of Paying Living Expenses: Somewhat hard      Food Insecurity: No Food Insecurity   ??? Worried About Running Out of Food in the Last Year: Never true   ??? Ran Out of Food in the Last Year: Never true      Transportation Needs: No Transportation Needs   ??? Lack of Transportation (Medical): No   ??? Lack of Transportation (Non-Medical): No      Physical Activity: Inactive   ??? Days of Exercise per Week: 0 days   ??? Minutes of Exercise per Session: 0 min      Stress: Stress Concern Present   ??? Feeling of Stress : Rather much      Intimate Partner Violence: Not At Risk   ??? Fear of Current or Ex-Partner: No   ??? Emotionally Abused: No   ??? Physically Abused: No   ??? Sexually Abused: No      Alcohol Use: Not At Risk   ??? How often do you have a drink containing alcohol?: 2 - 4 times per month   ??? How many drinks containing alcohol do you have on a typical day when you are drinking?: 3 - 4   ??? How often do you have 5 or more drinks on one occasion?: Never      Tobacco Use: Medium Risk   ??? Smoking Tobacco Use: Former Smoker   ??? Smokeless Tobacco Use: Never Used      Depression: Not on file        Upcoming Appointment (s):   No future appointments.      This patient is currently under review for Complex Case Management services. For progress, care plan changes, updates or recent discharges please contact CM.         Sarita Bottom Mikias Lanz      COVID Vaccine Status    Have you received your COVID Vaccine YES/NO: Yes.   If you would ike to receive your COVID-19 vaccine or would like more information please contact your PCP or visit [facility] at [email or web address]

## 2021-03-27 MED FILL — ENVARSUS XR 1 MG TABLET,EXTENDED RELEASE: ORAL | 30 days supply | Qty: 60 | Fill #6

## 2021-03-27 MED FILL — AMLODIPINE 5 MG TABLET: ORAL | 30 days supply | Qty: 60 | Fill #1

## 2021-03-27 MED FILL — ENVARSUS XR 4 MG TABLET,EXTENDED RELEASE: ORAL | 30 days supply | Qty: 120 | Fill #1

## 2021-03-27 MED FILL — ACETAMINOPHEN 500 MG TABLET: ORAL | 17 days supply | Qty: 100 | Fill #11

## 2021-04-04 NOTE — Unmapped (Signed)
Complex Case Management  Assessment  NOTE  04/04/2021      Summary:  [facility] Case Manager spoke with patient and verified correct patient using two identifiers today for enrollment in Complex Case Management. Informed patient of Complex Case Management services. Pt has agreed to participate in the Complex Case Management program. Case Manager contact information was provided to patient.     General Case management Questions:   General Care Management - Patient Level    Assessment completed with: patient[KH1.1]  Patient lives with: friends (Comment: lives with mother of his child but they are not romantically involved)[KH1.1]  Support system: parent, other family members, partner (Comment: family members and childs mother will provide emotional,transportation and financial support)[KH1.1]  Type of residence: apartment[KH1.1]  DME used at home: BP cuff, scale[KH1.1]  Transportation means: personal vehicle[KH1.1]  Does your health interfere with activities of daily living?: sleep[KH1.1]  Exercise: currently not exercising[KH1.1]  Follow special diet?: regular (Comment: not eating grapefruit due to transplant restriction)[KH1.1]  Interested in seeing dietician?: Yes (Comment: having a balanced diet with transplant)[KH1.1]  Experiencing side effects from current medications: No (Comment: not at this time)[KH1.1]  Interested in seeing pharmacist?: No (Comment: not at this time)[KH1.1]  Difficulty keeping appointments: No (Comment: not at this time)[KH1.1]  Need assistance with community resources?: Yes (Comment: housing resources in [place] coordination;food pantry resources)[KH1.2]  Other significant issues impacting care?: Kidney transplant in September 2020 still following up with transplant team, will need to schedule a follow  up with their office but has not been able to reach them via telephone per patient.   Appointment Coordination-he will need to follow up with Audiology as he currently has hearing impairment.   He is not established with a specific PCP [KH1.2]     Attribution     KH1.1 Sarita Bottom Hutchins 03/26/21 11:12    KH1.2 Steffanie Rainwater 03/26/21 11:30           Primary Health Concerns:  What is your/your child's primary health concern?: issues sleeping    Assessment:  Completed By: Patient    Self-Health:  How would you describe you or your child's current physical health?: Very Good  How would you describe you or your child's current mental health?: Excellent     What concerns would cause you to go to the ED (Emergency Department) rather than your PCP/other provider? : serious needs     Oral Health - How would you describe you or your child's oral health?: Good (top dentures, bottom partials)  Have you had a dental or oral exam in the last year?: (!) No    ACS Disability Status:   Are you deaf or do you have serious difficulty hearing?: (!) Yes (Alport syndrome- needs hearing aides.)  Do you wear hearing aids?: No  Do you use any assistive devices to communicate?: No  Summary of Vision, Driving, Reading/Writing, and Disability Status : Patient denies any vision issues and is able to drive. Patient is able to read/write and denies learning disabilities. Patient does have concerns with short term memory. Patient is A&Ox4. 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 Patient needs follow up appt with audiologist and needs to be evaluated for hearing aides.    Self Efficacy Assessment:                         Childhood Experiences:  Did you have any exposure to Adverse Childhood experiences or child trauma?: No  Pediatric ACE:                                     Support Summary:  Summary of Caregiver/Social Support: Patient's social support, mother, brother, son and sometimes aunt. They provide emotional support, but if he needed them, they would help him with anything.    ADL/IADL:  Dressing - Ability to perform: Independent  Grooming - Ability to perform: Independent  Feeding - Ability to perform: Independent  Bathing - Ability to perform: Independent  Toileting - Ability to perform: Independent  Transfering- Ability to perform: Independent  Continence: Futures trader - Do you have a problem with any of the following:: Independent    Vision:  Patient's Vision Adequate to Safely Complete Daily Activities: Yes    Driving Concerns:  Do you drive?: Yes   Mode of Transportation: Car    Reading/Writing:  Are you able to read?: Yes   Are you able to write?: Yes  Oriented to person, place, time, situation: Oriented x 4    Patient/Family Memory Concerns:  Do you have concerns about your memory?: (!) Yes (short term)   Does anyone in your family have concerns about your memory?: No    Learning and Disability:  History of attendance at a Special Education program or setting?: No   Intellectual Disability (formally Mental Retardation)?: No  Learning Disability?: No    CM Assessment:  Describe the patient's cognitive status:: 1  Summary of Memory and Cognitive Status: Patient is A&Ox.    Barriers to Care:  What are the patient's barriers? (Select all that apply): (!) Financial Stress, Unstable Housing, Health Literacy, Difficulty Accessing Healthcare    ACP Review:  ACP Reviewed: Yes (Would like to receive ACP documents)    Chronic Pain:  Do you/ your child have chronic pain?: (!) Yes (knees and back. Pain is 8. Pain is described as sharp. knees buckle)    Community Resources:         Dietary Needs:   Do you/your child have any special dietary needs or restrictions?: No (No grapefruit)    Home Health:  Any Home Health?: None    Insurance Benefits:  What is your current understanding of your insurance benefits?: Excellent    Culture and Beliefs:  Is there anything I should know about your culture, beliefs, or religious practices that would help me take better care of you?: Religious beliefs Ephriam Knuckles)    Life Planing Summary:  Summary of Advanced Care and Life Planning: CM provided ACP education. Patient does not have an ACP and would like to receive documents. Patient does not use any home health. Patient has no upcoming life transition or long term service needs. 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 Patient has an excellent understanding of his insurance, but has quesitons about his Medicaid card. Patient gets a new card every other week and each time, it has a different provider listed as his PCP. CM will help patient find out who his PCP needs to be. Patient receives Medicaid through [facility]. Patient does not receive any community resources, but would like information on housing and food pantries. 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 Patient does not volunteer for any organization.    CM Summary:  Patient needs/concerns addressed today:: CM provided ACP education,  Care Plan items covered  today:: ACP education  Care plan items planned for next conversation:: Medication Reconciliation        History Review:   Past Medical History:   Diagnosis Date   ??? Alport syndrome    ??? Anemia    ??? ESRD (end stage renal disease) (CMS-HCC)    ??? Financial difficulties    ??? Hearing impaired    ??? Hearing impairment    ??? History of transfusion    ??? Hypertension    ??? Nephrotic syndrome    ??? Renal transplant disorder    ??? Visual impairment      Caregiver burden No   Cognitive Impairment No   Falls Risk No   Financial difficulty Yes   Frail Elderly No   Hearing impairment/loss Yes   Homeless No   Impaired mobility No   Inadequate social/family support No   Ineffective family coping No   Low Literacy No   Nonadherence to medication No   Non-english speaking No   Terminal Illness/Hospice No   Transportation barriers No   Visual impairment Yes     Past Surgical History:   Procedure Laterality Date   ??? ABDOMINAL SURGERY     ??? AV FISTULA PLACEMENT Left 06/16/2002    left arm   ??? FRACTURE SURGERY     ??? NEPHRECTOMY TRANSPLANTED ORGAN 11-01-07    Deceased donor renal transplant 10/2007, episode of cellular acute rejection 11/2009     ??? PR REMOVAL OF RECIPIENT KIDNEY Right 03/20/2014    Procedure: RECIPIENT NEPHRECTOMY (SEPARATE PROCEDURE);  Surgeon: Vivi Barrack, MD;  [facility]: MA[facility] OR [facility];  Service: Transplant   ??? PR TRANSPLANT,PREP RENAL GRAFT/ARTERIAL N/A 02/20/2019    Procedure: [facility] RECONSTRUCTION CADAVER/LIVING DONOR RENAL ALLOGRAFT PRIOR TO TRANSPLANT; ARTERIAL ANASTOMOSIS EAC;  Surgeon: Leona Carry, MD;  Location: MAIN OR [facility];  Service: Transplant   ??? PR TRANSPLANTATION OF KIDNEY Left 02/20/2019    Procedure: RENAL ALLOTRANSPLANTATION, IMPLANTATION OF GRAFT; WITHOUT RECIPIENT NEPHRECTOMY;  Surgeon: Leona Carry, MD;  Location: MAIN OR [facility];  Service: Transplant     Family History   Problem Relation Age of Onset   ??? Alport syndrome Cousin    ??? Kidney disease Mother    ??? Kidney disease Maternal Grandmother    ??? Kidney disease Maternal Aunt      Counseling given: Not Answered  Comment: Occasional historical cigars, previously quit cigarettes in 2014 (9 years history of pack/day)    Counseling given: Not Answered  Comment: Occasional historical cigars, previously quit cigarettes in 2014 (9 years history of pack/day)                     Social History     Substance and Sexual Activity   Drug Use No       Medications:   Outpatient Encounter Medications as of 04/04/2021   Medication Sig Dispense Refill   ??? acetaminophen (TYLENOL) 500 MG tablet Take 1-2 tablets (500-1,000 mg total) by mouth every eight (8) hours as needed for pain or fever (> 38C). Max dose 3000 mg per day 100 tablet 11   ??? amLODIPine (NORVASC) 5 MG tablet Take 2 tablets (10 mg total) by mouth daily. 60 tablet 11   ??? carvediloL (COREG) 6.25 MG tablet TAKE 1 TABLET (6.25 MG TOTAL) BY MOUTH TWO (2) TIMES A DAY. 180 tablet 3   ??? cinacalcet (SENSIPAR) 30 MG tablet Take 1 tablet (30 mg total) by mouth daily. 30 tablet 11   ???  gabapentin (NEURONTIN) 100 MG capsule Take 1 capsule (100 mg total) by mouth Three (3) times a day as needed. 270 capsule 3   ??? mycophenolate (MYFORTIC) 180 MG EC tablet Take 2 tablets (360 mg total) by mouth Two (2) times a day. 120 tablet 11   ??? sodium bicarbonate 650 mg tablet Take 2 tablets (1,300 mg total) by mouth Two (2) times a day. 120 tablet 11   ??? sodium zirconium cyclosilicate (LOKELMA) 5 gram PwPk packet Take 2 packets (10 g total) by mouth Three (3) times a day. 180 packet 11   ??? tacrolimus (ENVARSUS XR) 1 mg Tb24 extended release tablet Take 2 tablets (2 mg total) by mouth daily with 4 (4 mg) tablets for a total dose of 18 mg daily 60 tablet 11   ??? tacrolimus (ENVARSUS XR) 4 mg Tb24 extended release tablet Take 4 tablets (16 mg total) by mouth daily with 2 (1mg ) tablets for a total dose of 18 mg daily 120 tablet 3   ??? zolpidem (AMBIEN) 10 mg tablet Take 10 mg by mouth nightly as needed for sleep. (Patient not taking: Reported on 02/22/2020)     ??? [DISCONTINUED] sodium polystyrene sulfonate (SODIUM POLYSTYRENE) 15 gram/60 mL Susp Take 120 mL (30 g total) by mouth daily for 2 days. 240 mL 1     No facility-administered encounter medications on file as of 04/04/2021.        Social History Review:   Physicist, medical Strain: Medium Risk   ??? Difficulty of Paying Living Expenses: Somewhat hard      Food Insecurity: No Food Insecurity   ??? Worried About Running Out of Food in the Last Year: Never true   ??? Ran Out of Food in the Last Year: Never true      Transportation Needs: No Transportation Needs   ??? Lack of Transportation (Medical): No   ??? Lack of Transportation (Non-Medical): No      Physical Activity: Inactive   ??? Days of Exercise per Week: 0 days   ??? Minutes of Exercise per Session: 0 min      Stress: Stress Concern Present   ??? Feeling of Stress : Rather much      Intimate Partner Violence: Not At Risk   ??? Fear of Current or Ex-Partner: No   ??? Emotionally Abused: No   ??? Physically Abused: No   ??? Sexually Abused: No Alcohol Use: Not At Risk   ??? How often do you have a drink containing alcohol?: 2 - 4 times per month   ??? How many drinks containing alcohol do you have on a typical day when you are drinking?: 3 - 4   ??? How often do you have 5 or more drinks on one occasion?: Never      Tobacco Use: Medium Risk   ??? Smoking Tobacco Use: Former Smoker   ??? Smokeless Tobacco Use: Never Used      Depression: Not at risk   ??? PHQ-2 Score: 0        PHQ-2 Score:  PHQ-2 Total Score : 0   PHQ-9 Score:        Emergency Back-up Plan   Does the patient currently have an emergency back-up plan in the event of a power outage or natural disaster? no   If no, I would like to assist you to identify a written plan to account for short and long-term needs in the event of an emergency situation, natural disaster, power outage or  equipment failure. This plan will also include identification of individuals in which you are able to contact to assist in an event of an emergency including emergency response system, provider, friends, family or alternate care provider.  Patients's Back-up Plan: Pt has access to call emergency contact , transportation and candles.     Care Plan:   Care plan shared with patient and provider: Yes    Patient Rights and Responsibilities:  Patient Rights and Responsibilities reviewed and provided with enclosed Welcome Letter via Care Plan : No    Upcoming Appointment (s):   No future appointments.    This patient is currently receiving Complex Case Management services.      Primary Case Manager: Sherlyn Hay, RN [phone]  Please contact CM for care plan changes, updates or recent discharges.    High Risk Drivers: Complex Diagnosis  Primary Disease Process: HTN Chronic Pain back pain, h/o kidney transplant, alport syndrome.  Current Residence: Home with ex girlfriend  Primary Medical Home: No PCP Per Patient???s office    Current services: None  Patient's Primary Concern is/goals are: Improve chronic condition management  Barriers: Health Literacy and Difficulty Accessing Healthcare  Strengths: Self-advocacy and Family connection  Supports: Parents and Extended Family  Interventions provided: Contact Information Provided, Introduction to ICM, Advanced Care Planning Education and Supportive Listening   Follow up with ICM Team Member: 2 weeks        Sherlyn Hay, RN

## 2021-04-07 ENCOUNTER — Emergency Department (HOSPITAL_COMMUNITY)
Admission: EM | Admit: 2021-04-07 | Discharge: 2021-04-08 | Disposition: A | Discharge status: Home / Self Care | Payer: Medicare Other | Attending: Emergency Medicine | Admitting: Emergency Medicine

## 2021-04-07 ENCOUNTER — Emergency Department (HOSPITAL_COMMUNITY): Payer: Medicare Other

## 2021-04-07 ENCOUNTER — Encounter (HOSPITAL_COMMUNITY): Payer: Self-pay

## 2021-04-07 ENCOUNTER — Other Ambulatory Visit: Payer: Self-pay

## 2021-04-07 DIAGNOSIS — Z20822 Contact with and (suspected) exposure to covid-19: Secondary | ICD-10-CM | POA: Insufficient documentation

## 2021-04-07 DIAGNOSIS — I120 Hypertensive chronic kidney disease with stage 5 chronic kidney disease or end stage renal disease: Secondary | ICD-10-CM | POA: Insufficient documentation

## 2021-04-07 DIAGNOSIS — Z992 Dependence on renal dialysis: Secondary | ICD-10-CM | POA: Diagnosis not present

## 2021-04-07 DIAGNOSIS — N186 End stage renal disease: Secondary | ICD-10-CM | POA: Insufficient documentation

## 2021-04-07 DIAGNOSIS — J209 Acute bronchitis, unspecified: Secondary | ICD-10-CM | POA: Diagnosis not present

## 2021-04-07 DIAGNOSIS — Z87891 Personal history of nicotine dependence: Secondary | ICD-10-CM | POA: Insufficient documentation

## 2021-04-07 DIAGNOSIS — E875 Hyperkalemia: Secondary | ICD-10-CM

## 2021-04-07 DIAGNOSIS — Z940 Kidney transplant status: Secondary | ICD-10-CM | POA: Diagnosis not present

## 2021-04-07 DIAGNOSIS — R059 Cough, unspecified: Secondary | ICD-10-CM | POA: Diagnosis present

## 2021-04-07 LAB — RESP PANEL BY RT-PCR (FLU A&B, COVID) ARPGX2
Influenza A by PCR: NEGATIVE
Influenza B by PCR: NEGATIVE
SARS Coronavirus 2 by RT PCR: NEGATIVE

## 2021-04-07 LAB — BASIC METABOLIC PANEL
Anion gap: 6 (ref 5–15)
BUN: 19 mg/dL (ref 6–20)
CO2: 23 mmol/L (ref 22–32)
Calcium: 9.3 mg/dL (ref 8.9–10.3)
Chloride: 107 mmol/L (ref 98–111)
Creatinine, Ser: 2.07 mg/dL — ABNORMAL HIGH (ref 0.61–1.24)
GFR, Estimated: 42 mL/min — ABNORMAL LOW (ref >60)
Glucose, Bld: 131 mg/dL — ABNORMAL HIGH (ref 70–99)
Potassium: 5.2 mmol/L — ABNORMAL HIGH (ref 3.5–5.1)
Sodium: 136 mmol/L (ref 135–145)

## 2021-04-07 NOTE — ED Provider Notes (Signed)
Emergency Medicine Provider Triage Evaluation Note  [name] , a 35 y.o. male  was evaluated in triage.  Pt complains of 4-day history of cough, congestion, right-sided chest pain, and sore throat.  He denies any abdominal symptoms, nausea, vomiting, diarrhea.  Review of Systems  Positive:  Negative: See above   Physical Exam  BP (!) 151/105 (BP Location: Right Arm)   Pulse 80   Temp 98.6 F (37 C) (Oral)   Resp 20   Ht $R'5\' 9"'eq$  (1.753 m)   Wt 65.8 kg   SpO2 100%   BMI 21.41 kg/m  Gen:   Awake, no distress   Resp:  Normal effort  MSK:   Moves extremities without difficulty  Other:    Medical Decision Making  Medically screening exam initiated at 9:53 PM.  Appropriate orders placed.  [name] was informed that the remainder of the evaluation will be completed by another provider, this initial triage assessment does not replace that evaluation, and the importance of remaining in the ED until their evaluation is complete.  History of renal transplant. Within window of Paxlovid.    Hendricks Limes, PA-C 04/07/21 [id number]    Jeanell Sparrow, DO 04/07/21 [id number]

## 2021-04-07 NOTE — ED Triage Notes (Signed)
Pt reports cough and right sided cp for 4-5 days. Kid recently tested covid +.

## 2021-04-08 DIAGNOSIS — J209 Acute bronchitis, unspecified: Secondary | ICD-10-CM | POA: Diagnosis not present

## 2021-04-08 MED ORDER — ALBUTEROL SULFATE HFA 108 (90 BASE) MCG/ACT IN AERS: 1.0000 | INHALATION_SPRAY | Freq: Four times a day (QID) | RESPIRATORY_TRACT | 0 refills | Status: AC | PRN

## 2021-04-08 MED ORDER — ALBUTEROL SULFATE (2.5 MG/3ML) 0.083% IN NEBU
2.5000 mg | INHALATION_SOLUTION | Freq: Once | RESPIRATORY_TRACT | Status: AC
  Administered 2021-04-08: 2.5 mg via RESPIRATORY_TRACT
  Filled 2021-04-08: qty 3

## 2021-04-08 MED ORDER — DOXYCYCLINE HYCLATE 100 MG PO CAPS: 100.0000 mg | ORAL_CAPSULE | Freq: Two times a day (BID) | ORAL | 0 refills | Status: AC

## 2021-04-08 MED ORDER — BUDESONIDE-FORMOTEROL FUMARATE 80-4.5 MCG/ACT IN AERO: 2.0000 | INHALATION_SPRAY | Freq: Two times a day (BID) | RESPIRATORY_TRACT | 12 refills | Status: AC

## 2021-04-08 NOTE — Discharge Instructions (Signed)
Your potassium is 5.2 today.  Recommend manage as directed by your care team.  Take doxycycline and complete the full course. Use albuterol inhaler 1 to 2 puffs every 4-6 hours as needed. Use Symbicort inhaler daily as prescribed. Recheck with your doctor, return to the emergency room for any worsening or concerning symptoms.

## 2021-04-08 NOTE — ED Provider Notes (Signed)
[facility] DEPT Provider Note   CSN: [id number] Arrival date & time: 04/07/21  2020     History Chief Complaint  Patient presents with   Cough    [name] is a 35 y.o. male.  35 year old male with past medical history of renal transplant presents with complaint of 4 days of cough, congestion, right-sided chest pain and sore throat.  No known sick contacts, denies fevers.  No other complaints or concerns.      Past Medical History:  Diagnosis Date   Alport syndrome    Alport's syndrome    EFFECT KIDNEYS+ HEARING   Anemia    Blood dyscrasia    GERD (gastroesophageal reflux disease)    GERD (gastroesophageal reflux disease)    Headache    Heart murmur    ASYMPTOMATIC   Kidney failure as a baby   went on dialysis age 62      T- TH-SAT   Sickle cell trait ([place])     Patient Active Problem List   Diagnosis Date Noted   Secondary hyperparathyroidism of renal origin ([place]) 12/03/2015   Hyperparathyroidism, secondary ([place]) 12/02/2015   ESRD on dialysis ([place]) 12/02/2015   Acute dyspnea 04/25/2014   H/O kidney transplant 11/12/2007   Alport syndrome 06/29/1995   Essential (primary) hypertension 06/19/1995    Past Surgical History:  Procedure Laterality Date   AV FISTULA PLACEMENT     LEFT ARM   KIDNEY TRANSPLANT     KIDNEY TRANSPLANT     NEPHRECTOMY TRANSPLANTED ORGAN     PARATHYROIDECTOMY N/A 12/03/2015   Procedure: TOTAL PARATHYROIDECTOMY WITH  AUTOTRANSPLANT TO RIGHT FOREARM ;  Surgeon: Armandina Gemma, MD;  Location: [facility];  Service: General;  Laterality: N/A;   transplant kidney removed Right 03/20/2014       Family History  Problem Relation Age of Onset   Cancer Mother     Social History   Tobacco Use   Smoking status: Former    Types: Cigarettes   Smokeless tobacco: Former   Tobacco comments:    pt smoked last cig today, cant have nicotine in system while waiting for kidney for transplant   Substance Use Topics    Alcohol use: No   Drug use: No    Home Medications Prior to Admission medications   Medication Sig Start Date End Date Taking? Authorizing Provider  acetaminophen (TYLENOL) 325 MG tablet Take 650 mg by mouth daily as needed.   Yes [provider]  albuterol (VENTOLIN HFA) 108 (90 Base) MCG/ACT inhaler Inhale 1-2 puffs into the lungs every 6 (six) hours as needed for wheezing or shortness of breath. 04/08/21  Yes Tacy Learn, PA-C  amLODipine (NORVASC) 5 MG tablet Take 5 mg by mouth daily. 07/11/19  Yes [provider]  budesonide-formoterol (SYMBICORT) 80-4.5 MCG/ACT inhaler Inhale 2 puffs into the lungs 2 (two) times daily. 04/08/21  Yes Tacy Learn, PA-C  carvedilol (COREG) 6.25 MG tablet Take 6.25 mg by mouth 2 (two) times daily. 06/28/19  Yes [provider]  doxycycline (VIBRAMYCIN) 100 MG capsule Take 1 capsule (100 mg total) by mouth 2 (two) times daily. 04/08/21  Yes Tacy Learn, PA-C  MYFORTIC 180 MG EC tablet Take 540 mg by mouth 2 (two) times daily. 02/24/19  Yes [provider]  Tacrolimus ER 1 MG TB24 Take 2 mg by mouth daily. 10/01/20  Yes [provider]  Tacrolimus ER 4 MG TB24 Take by mouth. 07/01/19  Yes [provider]  zolpidem (AMBIEN) 10 MG tablet Take 10 mg by mouth at bedtime as needed. 02/24/19   [provider]    Allergies    Tuberculin ppd  Review of Systems   Review of Systems  Constitutional:  Negative for chills and fever.  HENT:  Positive for congestion and sore throat.   Respiratory:  Positive for cough, shortness of breath and wheezing.   Cardiovascular:  Positive for chest pain.  Gastrointestinal:  Negative for abdominal pain, nausea and vomiting.  Genitourinary:  Negative for decreased urine volume.  Musculoskeletal:  Negative for arthralgias and myalgias.  Skin:  Negative for rash and wound.  Allergic/Immunologic: Positive for immunocompromised state.  Neurological:  Negative for  weakness.  Psychiatric/Behavioral:  Negative for confusion.   All other systems reviewed and are negative.  Physical Exam Updated Vital Signs BP (!) 153/102 (BP Location: Right Arm)   Pulse 77   Temp 98.6 F (37 C) (Oral)   Resp 16   Ht 5\' 9"  (1.753 m)   Wt 65.8 kg   SpO2 100%   BMI 21.41 kg/m   Physical Exam Vitals and nursing note reviewed.  Constitutional:      General: He is not in acute distress.    Appearance: He is well-developed. He is not diaphoretic.  HENT:     Head: Normocephalic and atraumatic.     Right Ear: Tympanic membrane and ear canal normal.     Left Ear: Tympanic membrane and ear canal normal.     Nose: Congestion present.     Mouth/Throat:     Mouth: Mucous membranes are moist.     Pharynx: Posterior oropharyngeal erythema present. No oropharyngeal exudate.  Eyes:     Conjunctiva/sclera: Conjunctivae normal.  Cardiovascular:     Rate and Rhythm: Normal rate and regular rhythm.     Pulses: Normal pulses.     Heart sounds: Normal heart sounds.  Pulmonary:     Effort: Pulmonary effort is normal.     Breath sounds: Wheezing and rhonchi present.  Musculoskeletal:     Cervical back: Neck supple.     Right lower leg: No edema.     Left lower leg: No edema.  Lymphadenopathy:     Cervical: No cervical adenopathy.  Skin:    General: Skin is warm and dry.     Findings: No erythema or rash.  Neurological:     Mental Status: He is alert and oriented to person, place, and time.  Psychiatric:        Behavior: Behavior normal.    ED Results / Procedures / Treatments   Labs (all labs ordered are listed, but only abnormal results are displayed) Labs Reviewed  BASIC METABOLIC PANEL - Abnormal; Notable for the following components:      Result Value   Potassium 5.2 (*)    Glucose, Bld 131 (*)    Creatinine, Ser 2.07 (*)    GFR, Estimated 42 (*)    All other components within normal limits  RESP PANEL BY RT-PCR (FLU A&B, COVID) ARPGX2     EKG None  Radiology DG Chest 2 View  Result Date: 04/07/2021 CLINICAL DATA:  Cough with right-sided chest pain. EXAM: CHEST - 2 VIEW COMPARISON:  Radiograph 02/10/2017 FINDINGS: The heart is normal in size.The cardiomediastinal contours are normal. The lungs are clear. Pulmonary vasculature is normal. No consolidation, pleural effusion, or pneumothorax. No acute osseous abnormalities are seen. IMPRESSION: Negative radiographs of the chest. Electronically Signed  By: Keith Rake M.D.   On: 04/07/2021 21:42    Procedures Procedures   Medications Ordered in ED Medications  albuterol (PROVENTIL) (2.5 MG/3ML) 0.083% nebulizer solution 2.5 mg (2.5 mg Nebulization Given 04/08/21 0205)    ED Course  I have reviewed the triage vital signs and the nursing notes.  Pertinent labs & imaging results that were available during my care of the patient were reviewed by me and considered in my medical decision making (see chart for details).  Clinical Course as of 04/08/21 0255  Mon Apr 09, 5195  3875 77-year-old male with 4 days of URI symptoms as above.  On exam, he is found have wheezing and coarse lung sounds throughout. Patient was given albuterol nebulizer with resolution.  Chest x-ray is unremarkable.  Patient had a BMP obtained in triage, his potassium today is 5.2.  Discussed with patient, he is managed closely by his transplant team, has [facility] and will contact his team in the morning to discuss presentation the ER today as well as lab results for further guidance.  He is COVID and flu negative.  Discussed with Dr. Florina Ou, ER attending, plan is for inhaled steroids, albuterol inhaler as well as doxycycline due to immune compromised state and risk for developing pneumonia. [LM]    Clinical Course User Index [LM] Roque Lias   MDM Rules/Calculators/A&P                           Final Clinical Impression(s) / ED Diagnoses Final diagnoses:  Acute bronchitis, unspecified  organism  Hyperkalemia    Rx / DC Orders ED Discharge Orders          Ordered    doxycycline (VIBRAMYCIN) 100 MG capsule  2 times daily        04/08/21 0240    albuterol (VENTOLIN HFA) 108 (90 Base) MCG/ACT inhaler  Every 6 hours PRN        04/08/21 0240    budesonide-formoterol (SYMBICORT) 80-4.5 MCG/ACT inhaler  2 times daily        04/08/21 0240             Tacy Learn, PA-C 04/08/21 0255    Molpus, Jenny Reichmann, MD 04/08/21 [phone]

## 2021-04-10 NOTE — Unmapped (Signed)
Patient said he was admitted locally for bronchitis and treated with, Symbicort,  Albuterol, and doxycycline. Advised to proceed with prescribed treatment.

## 2021-04-16 MED ORDER — ACETAMINOPHEN 500 MG TABLET
ORAL_TABLET | Freq: Three times a day (TID) | ORAL | 11 refills | 17 days | Status: CP | PRN
Start: 2021-04-16 — End: 2022-04-16
  Filled 2021-04-25: qty 100, 17d supply, fill #0

## 2021-04-16 NOTE — Unmapped (Signed)
Pt request for RX Refill         acetaminophen (TYLENOL) 500 MG tablet

## 2021-04-16 NOTE — Unmapped (Signed)
[facility] Refill Coordination Note    Specialty Medication(s) to be Shipped:   Transplant: Envarsus 1mg , Envarsus 4mg  and Myfortic 180mg     Other medication(s) to be shipped: gabapentin, sodium bicarb, tylenol, amlodipine carvedilol and cinacalcet     Russell Wade, DOB: 1985/09/05  Phone: [phone] (home)       All above HIPAA information was verified with patient.     Was a Nurse, learning disability used for this call? No    Completed refill call assessment today to schedule patient's medication shipment from the [facility] [phone]).  All relevant notes have been reviewed.     Specialty medication(s) and dose(s) confirmed: Regimen is correct and unchanged.   Changes to medications: Tejay reports no changes at this time.  Changes to insurance: No  New side effects reported not previously addressed with a pharmacist or physician: None reported  Questions for the pharmacist: No    Confirmed patient received a Conservation officer, historic buildings and a Surveyor, mining with first shipment. The patient will receive a drug information handout for each medication shipped and additional FDA Medication Guides as required.       DISEASE/MEDICATION-SPECIFIC INFORMATION        N/A    SPECIALTY MEDICATION ADHERENCE     Medication Adherence    Patient reported X missed doses in the last month: 0  Specialty Medication: Envarsus 1mg   Patient is on additional specialty medications: Yes  Additional Specialty Medications: Envarsus 4mg   Patient Reported Additional Medication X Missed Doses in the Last Month: 0  Patient is on more than two specialty medications: Yes  Specialty Medication: Myfortic 180mg   Patient Reported Additional Medication X Missed Doses in the Last Month: 0        Were doses missed due to medication being on hold? No    Envarsus 1 mg: 12 days of medicine on hand   Envarsus 4 mg: 12 days of medicine on hand   Myfortic 180 mg: 12 days of medicine on hand     REFERRAL TO PHARMACIST Referral to the pharmacist: Not needed      SHIPPING     Shipping address confirmed in Epic.     Delivery Scheduled: Yes, Expected medication delivery date: 04/26/2021.     Medication will be delivered via UPS to the prescription address in Epic WAM.    Lorelei Pont [facility] Specialty Technician

## 2021-04-18 NOTE — Unmapped (Signed)
Complex Case Management  SUMMARY NOTE    Attempted to contact pt today at Cell number to follow up for Complex Case Management services. No answer, unable to leave message; 1st attempt    Discuss at next visit: Medication Management       Sherlyn Hay, BSN, RN   [facility]  [address] [place], [place] [address]  P [phone]  [email or web address] [email or web address]

## 2021-04-19 LAB — BASIC METABOLIC PANEL
ANION GAP: 6 mmol/L
BLOOD UREA NITROGEN: 19 mg/dL
CALCIUM: 9.3 mg/dL
CHLORIDE: 107 mmol/L
CO2: 23 mmol/L
CREATININE: 2.07 mg/dL — ABNORMAL HIGH
EGFR CKD-EPI AA MALE: 42 mL/min/{1.73_m2} — ABNORMAL LOW
GLUCOSE RANDOM: 131 mg/dL — ABNORMAL HIGH
POTASSIUM: 5.2 mmol/L — ABNORMAL HIGH
SODIUM: 136 mmol/L

## 2021-04-19 NOTE — Unmapped (Signed)
COMPLEX CASE MANAGEMENT   Brief Note    Care Coordinator mailed blank Advance Care Planning document to patient per patient request.     Plan:     1. Case Management to: Follow up in 1 weeks    2. Patient/caregiver to: contact PCP/Complex Case Management program if needs arise.     Discuss at next outreach: Resource Coordination    Care Coordination Note updated in [facility]: Yes      Deliah Goody - High Risk Care Coordinator   [facility]  [address] [place], [place] [address]  P: [phone] F: [phone]  [email or web address]

## 2021-04-22 NOTE — Unmapped (Signed)
COMPLEX CASE MANAGEMENT   Brief Note    Care Coordinator sent Food Pantry Resources to patient via his [place] My Chart per patient request.     Plan:     1. Case Management to: follow up within the week    2. Patient/caregiver to: contact Complex Case Management if needs arise.     Discuss at next outreach: Resource Coordination    Care Coordination Note updated in [facility]: Yes      Deliah Goody - High Risk Care Coordinator   [facility]  [address] [place], [place] [address]  P: [phone] F: [phone]  Harvin [email or web address]

## 2021-04-22 NOTE — Unmapped (Signed)
COMPLEX CASE MANAGEMENT   Brief Note    Care Coordinator sent housing resources to patient via his [place] My Chart per patient request.      Plan:     1. Case Management to: follow up within the week    2. Patient/caregiver to: contact PCP/Complex Case Management program if needs arise.     Discuss at next outreach: Resource Coordination    Care Coordination Note updated in [facility]: Yes      Deliah Goody - High Risk Care Coordinator   [facility]  [address], [address] [place], [place] [address]  P: [phone] F: [phone]  Harvin [email or web address]

## 2021-04-22 NOTE — Unmapped (Signed)
COMPLEX CASE MANAGEMENT   Brief Note    Care Coordinator sent patient Primary Care Physicians near his home that are accepting new patients.     Plan:     1. Case Management to: follow up within the week     2. Patient/caregiver to: contact Complex Case Management if needs arise.     Discuss at next outreach: Medication Management with Case Manager    Care Coordination Note updated in [facility]: Yes      Deliah Goody - High Risk Care Coordinator   [facility]  [address], [address] [place], [place] [address]  P: [phone] F: [phone]  Harvin [email or web address]

## 2021-04-25 MED FILL — AMLODIPINE 5 MG TABLET: ORAL | 30 days supply | Qty: 60 | Fill #2

## 2021-04-25 MED FILL — CARVEDILOL 6.25 MG TABLET: ORAL | 90 days supply | Qty: 180 | Fill #1

## 2021-04-25 MED FILL — SODIUM BICARBONATE 650 MG TABLET: ORAL | 30 days supply | Qty: 120 | Fill #1

## 2021-04-25 MED FILL — MYFORTIC 180 MG TABLET,DELAYED RELEASE: ORAL | 30 days supply | Qty: 120 | Fill #1

## 2021-04-25 MED FILL — GABAPENTIN 100 MG CAPSULE: ORAL | 90 days supply | Qty: 270 | Fill #2

## 2021-04-25 MED FILL — ENVARSUS XR 1 MG TABLET,EXTENDED RELEASE: ORAL | 30 days supply | Qty: 60 | Fill #7

## 2021-04-25 MED FILL — CINACALCET 30 MG TABLET: ORAL | 30 days supply | Qty: 30 | Fill #1

## 2021-04-25 MED FILL — ENVARSUS XR 4 MG TABLET,EXTENDED RELEASE: ORAL | 30 days supply | Qty: 120 | Fill #2

## 2021-04-25 NOTE — Unmapped (Signed)
Complex Case Management  SUMMARY NOTE    Attempted to contact pt today at Cell number to follow up for Complex Case Management services. Left message to return call.; 2nd attempt    Discuss at next visit: Medication Management       Sherlyn Hay, BSN, RN   [facility]  [address], [address] [place], [place] [address]  P [phone]  Marylu [email or web address]

## 2021-05-02 NOTE — Unmapped (Signed)
COMPLEX CASE MANAGEMENT   FOLLOW UP NOTE  Summary:  Complex Case Manager  spoke with patient and verified correct patient using two identifiers today for Complex Case Management follow up. Patient currently resides at Home. Primary concern is none expressed.      Subjective:    Patient/caregiver reported he has not followed up with Medicaid regarding PCP. CM explained that Care Coordinator sent information on PCP's in his area and that he will need to choose and let Medicaid know. CM also suggested patient look up the PCP's that are listed on his card, to see if he would want to establish with one of them. Patient voiced understanding. CM asked patient if she has looked at information sent regarding food/housing resources and patient has not looked. CM explained that the information has been sent to his mychart account. Patient voiced understanding. CM complete medication reconciliation and verified allergies. CM will follow back up with patient in 3 weeks.       Objective:     Screenings Completed during visit: None completed at this visit    Barriers to care: Financial Stress and Health Literacy    Interventions provided: Medication Reconciliation    Progress towards Medication Reconciliation goal : Completed 05/02/21    Plan:     1. Case Management to: Follow up in 3 weeks    2. Patient/caregiver to: call PCP if questions/concerns arise    Discuss at next outreach: HTN Education     Care Coordination Note updated in [facility]: Yes    Future Appointments   Date Time Provider Department Center   07/12/2021  1:00 PM Posey Boyer True, MD [facility]enn[facility]       Problem List     ??? Kidney transplant complication    ??? Alport's syndrome    ??? Acid reflux    ??? High blood pressure    ??? History of kidney transplant    ??? Fungal skin infection    ??? Acute kidney failure    ??? Anemia    ??? Transplanted organ rejection    ??? High potassium levels    ??? COMPLICATIONS OF TRANSPLANTED KIDNEY    ??? Acute dyspnea    ??? Chronic kidney disease requiring chronic dialysis    ??? Parathyroid hormone excess    ??? Status post kidney transplant    ??? GENERAL MEDICAL EXAMINATION    ??? Other abnormal findings on microbiological examination of urine       Allergies:   Allergies   Allergen Reactions   ??? Tuberculin Ppd      Other reaction(s): Skin Rash       Medications:  Prior to Admission medications    Medication Dose, Route, Frequency   acetaminophen (TYLENOL) 500 MG tablet 500-1,000 mg, Oral, Every 8 hours PRN, Max dose 3000 mg per day   amLODIPine (NORVASC) 5 MG tablet 10 mg, Oral, Daily (standard)   carvediloL (COREG) 6.25 MG tablet TAKE 1 TABLET (6.25 MG TOTAL) BY MOUTH TWO (2) TIMES A DAY.   cinacalcet (SENSIPAR) 30 MG tablet 30 mg, Oral, Daily (standard)   gabapentin (NEURONTIN) 100 MG capsule 100 mg, Oral, 3 times a day PRN   mycophenolate (MYFORTIC) 180 MG EC tablet 360 mg, Oral, 2 times a day (standard)   sodium bicarbonate 650 mg tablet 1,300 mg, Oral, 2 times a day (standard)   sodium zirconium cyclosilicate (LOKELMA) 5 gram PwPk packet 10 g, Oral, 3 times a day (standard)   tacrolimus (ENVARSUS XR)  1 mg Tb24 extended release tablet Take 2 tablets (2 mg total) by mouth daily with 4 (4 mg) tablets for a total dose of 18 mg daily   tacrolimus (ENVARSUS XR) 4 mg Tb24 extended release tablet Take 4 tablets (16 mg total) by mouth daily with 2 (1mg ) tablets for a total dose of 18 mg daily   zolpidem (AMBIEN) 10 mg tablet 10 mg, Oral, Nightly PRN   sodium polystyrene sulfonate (SODIUM POLYSTYRENE) 15 gram/60 mL Susp 30 g, Oral, Daily          Sherlyn Hay, RN  05/02/2021

## 2021-05-17 NOTE — Unmapped (Signed)
[facility] Refill Coordination Note     Specialty Medication(s) to be Shipped:   Transplant: Envarsus 1mg , Envarsus 4mg  and Myfortic 180mg     Other medication(s) to be shipped: apap, amlodipine, cinacalcet, sod bicarb     Russell Wade, DOB: [birth year]  Phone: [phone] (home)       All above HIPAA information was verified with patient.     Was a Nurse, learning disability used for this call? No    Completed refill call assessment today to schedule patient's medication shipment from the [facility] [phone]).  All relevant notes have been reviewed.     Specialty medication(s) and dose(s) confirmed: Regimen is correct and unchanged.   Changes to medications: Marckus reports no changes at this time.  Changes to insurance: No  New side effects reported not previously addressed with a pharmacist or physician: None reported  Questions for the pharmacist: No    Confirmed patient received a Conservation officer, historic buildings and a Surveyor, mining with first shipment. The patient will receive a drug information handout for each medication shipped and additional FDA Medication Guides as required.       DISEASE/MEDICATION-SPECIFIC INFORMATION        N/A    SPECIALTY MEDICATION ADHERENCE     Medication Adherence    Patient reported X missed doses in the last month: 1  Specialty Medication: Envarsus Xr 1mg   Patient is on additional specialty medications: Yes  Additional Specialty Medications: Envarsus Xr 4mg   Patient Reported Additional Medication X Missed Doses in the Last Month: 0  Patient is on more than two specialty medications: Yes  Specialty Medication: Myfortic 180mg   Patient Reported Additional Medication X Missed Doses in the Last Month: 0              Were doses missed due to medication being on hold? No    Envarsus Xr 1 mg: 7 days of medicine on hand   Envarsus Xr 4 mg: 7 days of medicine on hand   Myfortic 180 mg: 7 days of medicine on hand       REFERRAL TO PHARMACIST     Referral to the pharmacist: Not needed      [facility]     Shipping address confirmed in Epic.     Delivery Scheduled: Yes, Expected medication delivery date: 05/21/21.     Medication will be delivered via UPS to the prescription address in Epic WAM.    Tera Helper   [facility] Specialty Pharmacist

## 2021-05-20 MED FILL — ENVARSUS XR 1 MG TABLET,EXTENDED RELEASE: ORAL | 30 days supply | Qty: 60 | Fill #8

## 2021-05-20 MED FILL — SODIUM BICARBONATE 650 MG TABLET: ORAL | 30 days supply | Qty: 120 | Fill #2

## 2021-05-20 MED FILL — CINACALCET 30 MG TABLET: ORAL | 30 days supply | Qty: 30 | Fill #2

## 2021-05-20 MED FILL — ENVARSUS XR 4 MG TABLET,EXTENDED RELEASE: ORAL | 30 days supply | Qty: 120 | Fill #3

## 2021-05-20 MED FILL — ACETAMINOPHEN 500 MG TABLET: ORAL | 17 days supply | Qty: 100 | Fill #1

## 2021-05-20 MED FILL — AMLODIPINE 5 MG TABLET: ORAL | 30 days supply | Qty: 60 | Fill #3

## 2021-05-20 MED FILL — MYFORTIC 180 MG TABLET,DELAYED RELEASE: ORAL | 30 days supply | Qty: 120 | Fill #2

## 2021-05-29 NOTE — Unmapped (Signed)
Complex Case Management  SUMMARY NOTE    Attempted to contact pt today at Home number to follow up for Complex Case Management services. Left message to return call.; 1st attempt    Discuss at next visit: HTN       Sherlyn Hay, BSN, RN   [facility]  [address], [address] [place], [place] [address]  P [phone]  Marylu [email or web address]

## 2021-06-12 NOTE — Unmapped (Signed)
COMPLEX CASE MANAGEMENT   FOLLOW UP NOTE  Summary:  Complex Case Manager  spoke with patient and verified correct patient using two identifiers today for Complex Case Management follow up. Patient currently resides at Home. Primary concern is not feeling well.      Subjective:    Patient/caregiver reported he is not feeling well and has had some nasal drainage and cough. CM discussed drinking plenty of liquids and rest. CM also discussed if the cold worsens, to go to the urgent care. Patient voiced understanding. CM asked patient if he received ACP documents and patient stated he did receive them. CM discussed upcoming graduation from CCM services and patient is in agreement.      Objective:     Screenings Completed during visit: None completed at this visit    Barriers to care: Health Literacy    Interventions provided: Advanced Care Planning Education    Progress towards Advanced Care Planning goal : Completed 06/12/21    Plan:     1. Case Management to: Follow up in 2 weeks    2. Patient/caregiver to: call PCP if questions/concerns arise.    Discuss at next outreach: Graduation from Complex Case Management    Care Coordination Note updated in [facility]: Yes    Future Appointments   Date Time Provider Department Center   07/12/2021  1:00 PM Posey Boyer True, MD [facility]       Problem List     ??? Kidney transplant complication    ??? Alport's syndrome    ??? Acid reflux    ??? High blood pressure    ??? History of kidney transplant    ??? Fungal skin infection    ??? Acute kidney failure    ??? Anemia    ??? Transplanted organ rejection    ??? High potassium levels    ??? COMPLICATIONS OF TRANSPLANTED KIDNEY    ??? Acute dyspnea    ??? Chronic kidney disease requiring chronic dialysis    ??? Parathyroid hormone excess    ??? Status post kidney transplant    ??? GENERAL MEDICAL EXAMINATION    ??? Other abnormal findings on microbiological examination of urine       Allergies:   Allergies   Allergen Reactions   ??? Tuberculin Ppd      Other reaction(s): Skin Rash       Medications:  Prior to Admission medications    Medication Dose, Route, Frequency   acetaminophen (TYLENOL) 500 MG tablet 500-1,000 mg, Oral, Every 8 hours PRN, Max dose 3000 mg per day   amLODIPine (NORVASC) 5 MG tablet 10 mg, Oral, Daily (standard)   carvediloL (COREG) 6.25 MG tablet TAKE 1 TABLET (6.25 MG TOTAL) BY MOUTH TWO (2) TIMES A DAY.   cinacalcet (SENSIPAR) 30 MG tablet 30 mg, Oral, Daily (standard)   gabapentin (NEURONTIN) 100 MG capsule 100 mg, Oral, 3 times a day PRN   mycophenolate (MYFORTIC) 180 MG EC tablet 360 mg, Oral, 2 times a day (standard)   sodium bicarbonate 650 mg tablet 1,300 mg, Oral, 2 times a day (standard)   sodium zirconium cyclosilicate (LOKELMA) 5 gram PwPk packet 10 g, Oral, 3 times a day (standard)   tacrolimus (ENVARSUS XR) 1 mg Tb24 extended release tablet Take 2 tablets (2 mg total) by mouth daily with 4 (4 mg) tablets for a total dose of 18 mg daily   tacrolimus (ENVARSUS XR) 4 mg Tb24 extended release tablet Take 4 tablets (16 mg total)  by mouth daily with 2 (1mg ) tablets for a total dose of 18 mg daily   zolpidem (AMBIEN) 10 mg tablet 10 mg, Oral, Nightly PRN   sodium polystyrene sulfonate (SODIUM POLYSTYRENE) 15 gram/60 mL Susp 30 g, Oral, Daily          Sherlyn Hay, RN  06/12/2021

## 2021-06-20 DIAGNOSIS — Z940 Kidney transplant status: Principal | ICD-10-CM

## 2021-06-20 DIAGNOSIS — D849 Immunodeficiency, unspecified: Principal | ICD-10-CM

## 2021-06-20 MED ORDER — ENVARSUS XR 4 MG TABLET,EXTENDED RELEASE
ORAL_TABLET | Freq: Every day | ORAL | 3 refills | 30 days
Start: 2021-06-20 — End: ?

## 2021-06-20 NOTE — Unmapped (Signed)
[facility] Refill Coordination Note    Specialty Medication(s) to be Shipped:   Transplant: Envarsus 1mg , Envarsus 4mg  and Myfortic 180mg     Other medication(s) to be shipped: apap, amlodipine, cinacalcet, sod bicarb     Russell Wade, DOB: 27-Jul-1985  Phone: [phone] (home)       All above HIPAA information was verified with patient.     Was a Nurse, learning disability used for this call? No    Completed refill call assessment today to schedule patient's medication shipment from the [facility] [phone]).  All relevant notes have been reviewed.     Specialty medication(s) and dose(s) confirmed: Regimen is correct and unchanged.   Changes to medications: Soren reports no changes at this time.  Changes to insurance: No  New side effects reported not previously addressed with a pharmacist or physician: None reported  Questions for the pharmacist: No    Confirmed patient received a Conservation officer, historic buildings and a Surveyor, mining with first shipment. The patient will receive a drug information handout for each medication shipped and additional FDA Medication Guides as required.       DISEASE/MEDICATION-SPECIFIC INFORMATION        N/A    SPECIALTY MEDICATION ADHERENCE     Medication Adherence    Patient reported X missed doses in the last month: 1  Specialty Medication: Envarsus 1mg   Patient is on additional specialty medications: Yes  Additional Specialty Medications: Envarsus 4mg   Patient Reported Additional Medication X Missed Doses in the Last Month: 1  Patient is on more than two specialty medications: Yes  Specialty Medication: Myfortic 180mg   Patient Reported Additional Medication X Missed Doses in the Last Month: 1              Were doses missed due to medication being on hold? No    Envarsus Xr 1 mg: 7 days of medicine on hand   Envarsus Xr 4 mg: 7 days of medicine on hand   Myfortic 180 mg: 7 days of medicine on hand       REFERRAL TO PHARMACIST     Referral to the pharmacist: Not needed      [facility]     Shipping address confirmed in Epic.     Delivery Scheduled: Yes, Expected medication delivery date: 06/24/21.     Medication will be delivered via UPS to the prescription address in Epic WAM.    Tera Helper   [facility] Specialty Pharmacist

## 2021-06-21 DIAGNOSIS — D849 Immunodeficiency, unspecified: Principal | ICD-10-CM

## 2021-06-21 DIAGNOSIS — Z940 Kidney transplant status: Principal | ICD-10-CM

## 2021-06-21 MED ORDER — TACROLIMUS XR 4 MG TABLET,EXTENDED RELEASE 24 HR
ORAL_TABLET | Freq: Every day | ORAL | 3 refills | 30 days | Status: CP
Start: 2021-06-21 — End: ?
  Filled 2021-06-21: qty 120, 30d supply, fill #0

## 2021-06-21 MED FILL — ACETAMINOPHEN 500 MG TABLET: ORAL | 17 days supply | Qty: 100 | Fill #2

## 2021-06-21 MED FILL — CINACALCET 30 MG TABLET: ORAL | 30 days supply | Qty: 30 | Fill #3

## 2021-06-21 MED FILL — SODIUM BICARBONATE 650 MG TABLET: ORAL | 30 days supply | Qty: 120 | Fill #3

## 2021-06-21 MED FILL — MYFORTIC 180 MG TABLET,DELAYED RELEASE: ORAL | 30 days supply | Qty: 120 | Fill #3

## 2021-06-21 MED FILL — ENVARSUS XR 1 MG TABLET,EXTENDED RELEASE: ORAL | 30 days supply | Qty: 60 | Fill #9

## 2021-06-21 MED FILL — AMLODIPINE 5 MG TABLET: ORAL | 30 days supply | Qty: 60 | Fill #4

## 2021-06-24 NOTE — Unmapped (Signed)
Complex Case Management  SUMMARY NOTE    Attempted to contact pt today at Cell number to resolve Complex Case Management services. No answer, unable to leave message; 1st attempt    Discuss at next visit: Graduation from Complex Case Management       Sherlyn Hay, BSN, RN   [facility]  [address] [place], [place] [address]  P [phone]  [email or web address]

## 2021-06-25 NOTE — Unmapped (Signed)
Complex Case Management   Final Call- Program Completion  Summary:   Case Manager spoke with patient today to resolve Complex Case Management services. Patient currently resides at Home.    Patient/caregiver reported he has a blood pressure cuff at home and he hasn't had any issues with his blood pressures CM discussed watching sodium in his diet and making sure that he has some physical activity. 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 Patient tends to eat one meal d/t his shift hours and when he gets off work, he does not eat and goes home to sleep. Patient states he eats around 9 pm-12am. CM suggested snacking throughout the day, to help with not just eating one meal. CM discussed importance of eating and how it can affect the whole body (fatigue). Patient voiced understanding. CM discussed stress management and patient states he likes to be alone and will sleep. CM discussed mindfulness, talking with a friend and physical activity. CM will send mindfulness information to patient. CM asked patient if he has a follow up with PCP and patient does not have one coming up, but does have an appt with Nephrology. CM asked patient about PCP and patient states his Medicaid card has [facility], but did not remember the full office name. CM discussed graduation and patient was in agreement.    How would you rate your overall health right now?  (Scale of 1-5) 5- Excellent     Health Literacy: How confident are you that you understand your health issues/concerns, can participate in your care, and manage your care along with your physician (Scale of 1-3): 3- Confident    Plan:   Provided PCP office number and after-hours nurse line information for future care needs     Care Coordination Note updated in [facility]: Yes    Pt Care Coordination Note:  This patient completed Complex Case Management services on 06/25/2021.     The following barriers were addressed: Health Literacy    The following interventions were provided: Contact Information Provided, Introduction to ICM, Medication Reconciliation, Advanced Care Planning Education and Supportive Listening     If this patient develops new chronic conditions, new opportunities to resolve barriers, or other areas of need, please send an AMB Referral to Case Management to the Personal Health Advocate Department.

## 2021-07-08 DIAGNOSIS — Z79899 Other long term (current) drug therapy: Principal | ICD-10-CM

## 2021-07-08 DIAGNOSIS — E119 Type 2 diabetes mellitus without complications: Principal | ICD-10-CM

## 2021-07-08 DIAGNOSIS — Z940 Kidney transplant status: Principal | ICD-10-CM

## 2021-07-12 ENCOUNTER — Ambulatory Visit: Admit: 2021-07-12 | Payer: MEDICARE | Attending: Nephrology | Primary: Nephrology

## 2021-07-12 NOTE — Unmapped (Unsigned)
Transplant Nephrology Clinic Visit      History of Present Illness    Russell Wade is a 36 y.o. 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 He does not have evidence of donor specific antibodies as of 08/09/20. His most recent baseline creatinine has been 1.7-2.4, but he does not get regular labs and has not shown/cancelled scheduled biopsies.    This is his second kidney transplant, his first was from a deceased donor on 2007/12/10 and failed 11/24/2013 secondary to rejection. He underwent transplant nephrectomy 03/20/2014.    This transplant has been complicated by delayed graft function, he required dialysis for low urine output and was discharged from the hospital on dialysis. He was initially on Myfortic 720 mg BID, reduced to 540 mg BID on 03/17/19 for diarrhea. His last dialysis was 03/12/19. He underwent transplant renal biopsy on 03/14/19 that showed diffuse subacute tubular injury with no evidence of rejection. His ureteral stent was removed 03/28/19. Required Kayexalate and Lokelma intermittently for hyperkalemia, Bactrim held. Envarsus levels elevated/labile as well. Required some Aranesp for anemia.     After stopping dialysis, his creatinine slowly trended down into the mid 2s in December, the started to rise again into the 3s and back into the mid 2s in January 2021. For that reason, we recommended biopsy at the end of January. He finally scheduled the biopsy for 07/29/19. A day prior to that biopsy, he had a COVID exposure and the biopsy was rescheduled for 08/08/19. Unfortunately he did not show for that biopsy due to not having a ride. At his visit on 08/25/19 he could not recall when he took his Envarsus the day prior, and we noted that there were a lot of issues getting a reliable trough. We recommended admission for biopsy the next day but he refused due to work and family obligations. He did agree to schedule a biopsy the following week. He was scheduled for biopsy 09/05/19 but again he no showed. He was seen in clinic on 10/20/19 with creatinine of 2.44, has not had labs since that visit.    He saw audiology on 01/10/20 and was shown to have mild bilateral sensorineural hearing loss, unchanged from 2014. They recommended he return for a hearing aid consult on 7/29, do not see that he showed for that, rescheduled for 9/13, did attend that and was being fitted for a hearing aid.    After his visit 02/22/20, patient did not get his kidney transplant biopsy. He canceled due to transportation and never rescheduled it.     Interval history since last visit 10/11/2020    He had a K of 6.1 on 01/25/21, was instructed on 01/28/21 to take Lokelma 10 g TID and repeat labs 01/31/21. He did not get labs again until 04/07/21, K was 5.2.     He had his annual imaging performed on 03/11/21, it was unremarkable.    He called 04/10/21 to let us know he was admitted locally for bronchitis and treated with albuterol, symbicort and doxycycline.     Has been engaged with complex case management services from 04/04/21 through 06/25/21. They talked to him about establishing a PCP but he had not yet done so.    He presents today for follow up. He has had labs twice in our system since his last visit. His last tacrolimus level was on 01/25/21.    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     He specifically denies fever, myalgias, upper respiratory/GI symptoms, or change in taste/smell.    Last dose of Envarsus: *** yesterday    Review of Systems    Otherwise on review of systems patient denies fever or chills, chest pain, SOB, PND or orthopnea, lower extremity edema. Denies N/V/abdominal pain. No dysuria, hematuria or difficulty voiding. Bowel movements normal. Denies joint pain or rash. All other systems are reviewed and are negative.    Medications    Current Outpatient Medications   Medication Sig Dispense Refill   ??? acetaminophen (TYLENOL) 500 MG tablet Take 1-2 tablets (500-1,000 mg total) by mouth every eight (8) hours as needed for pain or fever (> 38C). Max dose 3000 mg per day 100 tablet 11   ??? amLODIPine (NORVASC) 5 MG tablet Take 2 tablets (10 mg total) by mouth daily. 60 tablet 11   ??? carvediloL (COREG) 6.25 MG tablet TAKE 1 TABLET (6.25 MG TOTAL) BY MOUTH TWO (2) TIMES A DAY. 180 tablet 3   ??? cinacalcet (SENSIPAR) 30 MG tablet Take 1 tablet (30 mg total) by mouth daily. 30 tablet 11   ??? gabapentin (NEURONTIN) 100 MG capsule Take 1 capsule (100 mg total) by mouth Three (3) times a day as needed. 270 capsule 3   ??? mycophenolate (MYFORTIC) 180 MG EC tablet Take 2 tablets (360 mg total) by mouth Two (2) times a day. 120 tablet 11   ??? sodium bicarbonate 650 mg tablet Take 2 tablets (1,300 mg total) by mouth Two (2) times a day. 120 tablet 11   ??? sodium zirconium cyclosilicate (LOKELMA) 5 gram PwPk packet Take 2 packets (10 g total) by mouth Three (3) times a day. 180 packet 11   ??? tacrolimus (ENVARSUS XR) 1 mg Tb24 extended release tablet Take 2 tablets (2 mg total) by mouth daily with 4 (4 mg) tablets for a total dose of 18 mg daily 60 tablet 11   ??? tacrolimus (ENVARSUS XR) 4 mg Tb24 extended release tablet Take 4 tablets (16 mg total) by mouth daily with 2 (1mg ) tablets for a total dose of 18 mg daily 120 tablet 3   ??? zolpidem (AMBIEN) 10 mg tablet Take 10 mg by mouth nightly as needed for sleep.       No current facility-administered medications for this visit.       Physical Exam    There were no vitals taken for this visit.  General: Patient is a pleasant male in no apparent distress.  Eyes: Sclera anicteric.  ENT: Mask in place.  Neck: Supple without LAD/JVD/bruits.  Lungs: Clear to auscultation bilaterally, no wheezes/rales/rhonchi.  Cardiovascular: Regular rate and rhythm without murmurs, rubs or gallops.  Abdomen: Soft, notender/nondistended. Positive bowel sounds. No tenderness over the graft.  Extremities: Without edema, joints without evidence of synovitis.  Skin: Without rash.  Neurological: Grossly nonfocal.  Psychiatric: Mood and affect appropriate.      Laboratory Results    No results found for this or any previous visit (from the past 170 hour(s)).    Assessment and Plan    1. Status post second renal transplant. His creatinine today is 1.82, which is within his most recent baseline. Had wanted to biopsy him since his baseline is on the higher side,but he has no showed and cancelled multiple biopsies. Will hold off for now as long as creatinine stable. His tacrolimus level was not  sent today as he already took his dose. His goal is 6-8. Will continue current immunosuppression. Have talked extensively about the importance of labs.     Regarding the nerve pain over the transplant, his exam and labs are reassuring. UA unremarkable. Discussed that he may have intermittent symptoms like this as his nerves regrow at the incision site.    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 Did see audiology and was found to have stable hearing loss. Recommended being fitted for right hearing aid.     3. Hypertension. Does not measure blood pressure at home, is 123/83 today. Continue amlodipine.    4. Hyperkalemia. K mildly elevated at 5.2. Is not taking Lokelma right now but has to use prn. Has been counseled on a low potassium diet.    5. Hypomagnesemia. Mg 1.6 today off supplementation.    6. Hypercalcemia. Calcium improved to 9.6 from 11.2 on Sensipar.    7. Metabolic acidosis. CO2 normal at 24, not on supplementation.    8. Hyperlipidemia. Triglycerides have been elevated (381 01/25/20 from 415 11/17/19), did some dietary counseling. May need to treat if remain high. Will get lipid panel at next visit.    9. Health maintenance. Received the flu shot 03/23/20, has not received pneumococcal vaccines. Received third dose of COVID vaccine in clinic today 10/11/20. Will check spike IgG Ab next visit to see if he qualifies for Evusheld.     We discussed that while we continue to recommend the COVID vaccine for all eligible patients, we know that kidney transplant patients do not respond as strongly due to their immunosuppression, so he should continue to follow the CDC guidelines for patients who are not vaccinated. I would also encourage all household members and close contacts that are eligible for vaccination to get vaccinated. We discussed the importance of ongoing social distancing, wearing a mask outside the home, and hand washing/sanitizing.     10. Will see patient back in 4 months, or sooner if needed. Hopefully will get labs in the meantime.

## 2021-07-16 NOTE — Unmapped (Signed)
[facility] Refill Coordination Note    Specialty Medication(s) to be Shipped:   Transplant: Envarsus 1mg , Envarsus 4mg  and Myfortic 180mg     Other medication(s) to be shipped:   apap, amlodipine, cinacalcet, sod bicarb, carvedilol, gabapentin        Russell Wade, DOB: 05/01/1986  Phone: [phone] (home)       All above HIPAA information was verified with patient.     Was a Nurse, learning disability used for this call? No    Completed refill call assessment today to schedule patient's medication shipment from the [facility] [phone]).  All relevant notes have been reviewed.     Specialty medication(s) and dose(s) confirmed: Regimen is correct and unchanged.   Changes to medications: Jushua reports no changes at this time.  Changes to insurance: No  New side effects reported not previously addressed with a pharmacist or physician: None reported  Questions for the pharmacist: No    Confirmed patient received a Conservation officer, historic buildings and a Surveyor, mining with first shipment. The patient will receive a drug information handout for each medication shipped and additional FDA Medication Guides as required.       DISEASE/MEDICATION-SPECIFIC INFORMATION        N/A    SPECIALTY MEDICATION ADHERENCE     Medication Adherence    Patient reported X missed doses in the last month: 0  Specialty Medication: ENVARSUS XR 1 mg Tb24 extended release tablet (tacrolimus)  Patient is on additional specialty medications: Yes  Additional Specialty Medications: ENVARSUS XR 4 mg Tb24 extended release tablet (tacrolimus)  Patient is on more than two specialty medications: Yes  Specialty Medication: MYFORTIC 180 MG EC tablet (mycophenolate)  Patient Reported Additional Medication X Missed Doses in the Last Month: 0        Envarsus Xr 1 mg: 10 days of medicine on hand   Envarsus Xr 4 mg: 10 days of medicine on hand   Myfortic 180 mg: 10 days of medicine on hand         Were doses missed due to medication being on hold? No      REFERRAL TO PHARMACIST     Referral to the pharmacist: Not needed      [facility]     Shipping address confirmed in Epic.     Delivery Scheduled: Yes, Expected medication delivery date: 07/23/21.     Medication will be delivered via UPS to the prescription address in Epic WAM.    Russell Wade   [facility] Specialty Technician

## 2021-07-17 MED FILL — SODIUM BICARBONATE 650 MG TABLET: ORAL | 30 days supply | Qty: 120 | Fill #4

## 2021-07-17 MED FILL — ACETAMINOPHEN 500 MG TABLET: ORAL | 17 days supply | Qty: 100 | Fill #3

## 2021-07-17 MED FILL — CARVEDILOL 6.25 MG TABLET: ORAL | 90 days supply | Qty: 180 | Fill #2

## 2021-07-17 MED FILL — GABAPENTIN 100 MG CAPSULE: ORAL | 90 days supply | Qty: 270 | Fill #3

## 2021-07-17 MED FILL — ENVARSUS XR 1 MG TABLET,EXTENDED RELEASE: ORAL | 30 days supply | Qty: 60 | Fill #10

## 2021-07-17 MED FILL — CINACALCET 30 MG TABLET: ORAL | 30 days supply | Qty: 30 | Fill #4

## 2021-07-17 MED FILL — MYFORTIC 180 MG TABLET,DELAYED RELEASE: ORAL | 30 days supply | Qty: 120 | Fill #4

## 2021-07-17 MED FILL — AMLODIPINE 5 MG TABLET: ORAL | 30 days supply | Qty: 60 | Fill #5

## 2021-07-17 MED FILL — ENVARSUS XR 4 MG TABLET,EXTENDED RELEASE: ORAL | 30 days supply | Qty: 120 | Fill #1

## 2021-08-17 NOTE — Unmapped (Signed)
[facility] Refill Coordination Note    Specialty Medication(s) to be Shipped:   Transplant: Envarsus 4mg  and Envarsus 1mg     Other medication(s) to be shipped: No additional medications requested for fill at this time     Russell Wade, DOB: 07/29/1985  Phone: [phone] (home)       All above HIPAA information was verified with patient.     Was a Nurse, learning disability used for this call? No    Completed refill call assessment today to schedule patient's medication shipment from the [facility] [phone]).  All relevant notes have been reviewed.     Specialty medication(s) and dose(s) confirmed: Regimen is correct and unchanged.   Changes to medications: Cato reports no changes at this time.  Changes to insurance: No  New side effects reported not previously addressed with a pharmacist or physician: None reported  Questions for the pharmacist: No    Confirmed patient received a Conservation officer, historic buildings and a Surveyor, mining with first shipment. The patient will receive a drug information handout for each medication shipped and additional FDA Medication Guides as required.       DISEASE/MEDICATION-SPECIFIC INFORMATION        N/A    SPECIALTY MEDICATION ADHERENCE     Medication Adherence    Patient reported X missed doses in the last month: 2  Specialty Medication: ENVARSUS XR 1 mg  Patient is on additional specialty medications: Yes  Additional Specialty Medications: ENVARSUS XR 4 mg    Patient Reported Additional Medication X Missed Doses in the Last Month: 2  Patient is on more than two specialty medications: Yes  Specialty Medication: MYFORTIC 180 MG EC  Demonstrates understanding of importance of adherence: yes  Informant: patient  Reliability of informant: reliable  Confirmed plan for next specialty medication refill: delivery by pharmacy  Refills needed for supportive medications: not needed              Were doses missed due to medication being on hold? No    ENVARSUS XR 1 mg : 5 days of medicine on hand   ENVARSUS XR 4 mg : 5 days of medicine on hand       REFERRAL TO PHARMACIST     Referral to the pharmacist: Not needed      [facility]     Shipping address confirmed in Epic.     Delivery Scheduled: Yes, Expected medication delivery date: 08/21/21.     Medication will be delivered via UPS to the prescription address in Epic WAM.    Yolonda Kida   [facility] Specialty Technician

## 2021-08-20 MED FILL — ENVARSUS XR 4 MG TABLET,EXTENDED RELEASE: ORAL | 30 days supply | Qty: 120 | Fill #2

## 2021-08-20 MED FILL — ENVARSUS XR 1 MG TABLET,EXTENDED RELEASE: ORAL | 30 days supply | Qty: 60 | Fill #11

## 2021-09-06 DIAGNOSIS — D849 Immunodeficiency, unspecified: Principal | ICD-10-CM

## 2021-09-06 DIAGNOSIS — Z940 Kidney transplant status: Principal | ICD-10-CM

## 2021-09-06 MED ORDER — TACROLIMUS XR 1 MG TABLET,EXTENDED RELEASE 24 HR
ORAL_TABLET | Freq: Every day | ORAL | 11 refills | 30 days | Status: CP
Start: 2021-09-06 — End: ?
  Filled 2021-09-16: qty 60, 30d supply, fill #0

## 2021-09-06 MED ORDER — GABAPENTIN 100 MG CAPSULE
ORAL_CAPSULE | Freq: Three times a day (TID) | ORAL | 3 refills | 90 days | Status: CP | PRN
Start: 2021-09-06 — End: 2022-09-06
  Filled 2021-10-11: qty 270, 90d supply, fill #0

## 2021-09-06 MED FILL — CINACALCET 30 MG TABLET: ORAL | 30 days supply | Qty: 30 | Fill #5

## 2021-09-06 MED FILL — SODIUM BICARBONATE 650 MG TABLET: ORAL | 30 days supply | Qty: 120 | Fill #5

## 2021-09-06 MED FILL — ACETAMINOPHEN 500 MG TABLET: ORAL | 17 days supply | Qty: 100 | Fill #4

## 2021-09-06 MED FILL — AMLODIPINE 5 MG TABLET: ORAL | 30 days supply | Qty: 60 | Fill #6

## 2021-09-06 MED FILL — MYFORTIC 180 MG TABLET,DELAYED RELEASE: ORAL | 30 days supply | Qty: 120 | Fill #5

## 2021-09-06 NOTE — Unmapped (Signed)
The patient is requesting a medication refill

## 2021-09-06 NOTE — Unmapped (Signed)
[facility] Refill Coordination Note    Specialty Medication(s) to be Shipped:   Transplant: Myfortic 180mg     Other medication(s) to be shipped: acetaminophen,amlodipine,cinacalcet,sodium bicarb     Russell Wade, DOB: 11-04-1985  Phone: [phone] (home)       All above HIPAA information was verified with patient.     Was a Nurse, learning disability used for this call? No    Completed refill call assessment today to schedule patient's medication shipment from the [facility] [phone]).  All relevant notes have been reviewed.     Specialty medication(s) and dose(s) confirmed: Regimen is correct and unchanged.   Changes to medications: Altariq reports no changes at this time.  Changes to insurance: No  New side effects reported not previously addressed with a pharmacist or physician: None reported  Questions for the pharmacist: No    Confirmed patient received a Conservation officer, historic buildings and a Surveyor, mining with first shipment. The patient will receive a drug information handout for each medication shipped and additional FDA Medication Guides as required.       DISEASE/MEDICATION-SPECIFIC INFORMATION        N/A    SPECIALTY MEDICATION ADHERENCE     Medication Adherence    Patient reported X missed doses in the last month: 0  Specialty Medication: myfortic 180mg   Patient is on additional specialty medications: No  Patient is on more than two specialty medications: No  Any gaps in refill history greater than 2 weeks in the last 3 months: no  Demonstrates understanding of importance of adherence: yes  Informant: patient  Reliability of informant: reliable  Provider-estimated medication adherence level: good  Patient is at risk for Non-Adherence: No  Reasons for non-adherence: no problems identified  Confirmed plan for next specialty medication refill: delivery by pharmacy  Refills needed for supportive medications: not needed          Refill Coordination    Has the Patients' Contact Information Changed: No  Is the Shipping Address Different: No         Were doses missed due to medication being on hold? No    Myfortic 180 mg: 5 days of medicine on hand       Referral to the pharmacist: Not needed      [facility]     Shipping address confirmed in Epic.     Delivery Scheduled: Yes, Expected medication delivery date: 03/27.     Medication will be delivered via UPS to the prescription address in Epic WAM.    Antonietta Barcelona   [facility] Specialty Technician

## 2021-09-11 NOTE — Unmapped (Signed)
[facility] Refill Coordination Note    Specialty Medication(s) to be Shipped:   Transplant: Envarsus 1mg  and Envarsus 4mg     Other medication(s) to be shipped: gabapentin     Russell Wade, DOB: 1985/09/20  Phone: [phone] (home)       All above HIPAA information was verified with patient.     Was a Nurse, learning disability used for this call? No    Completed refill call assessment today to schedule patient's medication shipment from the [facility] [phone]).  All relevant notes have been reviewed.     Specialty medication(s) and dose(s) confirmed: Regimen is correct and unchanged.   Changes to medications: [name] reports no changes at this time.  Changes to insurance: No  New side effects reported not previously addressed with a pharmacist or physician: None reported  Questions for the pharmacist: No    Confirmed patient received a Conservation officer, historic buildings and a Surveyor, mining with first shipment. The patient will receive a drug information handout for each medication shipped and additional FDA Medication Guides as required.       DISEASE/MEDICATION-SPECIFIC INFORMATION        N/A    SPECIALTY MEDICATION ADHERENCE     Medication Adherence    Patient reported X missed doses in the last month: 0  Specialty Medication: tacrolimus (ENVARSUS XR) 4 mg Tb24 extended release tablet  Patient is on additional specialty medications: Yes  Additional Specialty Medications: tacrolimus (ENVARSUS XR) 1 mg Tb24 extended release tablet  Patient Reported Additional Medication X Missed Doses in the Last Month: 0  Patient is on more than two specialty medications: No         Envarsus 1mg   8 days worth of medication on hand.  Envarsus 4mg   8 days worth of medication on hand.        Were doses missed due to medication being on hold? No        REFERRAL TO PHARMACIST     Referral to the pharmacist: Not needed      [facility]     Shipping address confirmed in Epic.     Delivery Scheduled: Yes, Expected medication delivery date: 09/17/21.     Medication will be delivered via UPS to the prescription address in Epic WAM.    Russell Wade   [facility] Specialty Technician

## 2021-09-16 MED FILL — ENVARSUS XR 4 MG TABLET,EXTENDED RELEASE: ORAL | 30 days supply | Qty: 120 | Fill #3

## 2021-10-09 DIAGNOSIS — D849 Immunodeficiency, unspecified: Principal | ICD-10-CM

## 2021-10-09 DIAGNOSIS — Z940 Kidney transplant status: Principal | ICD-10-CM

## 2021-10-09 MED ORDER — ENVARSUS XR 4 MG TABLET,EXTENDED RELEASE
ORAL_TABLET | Freq: Every day | ORAL | 3 refills | 30 days
Start: 2021-10-09 — End: ?

## 2021-10-09 NOTE — Unmapped (Signed)
[facility] Refill Coordination Note    Specialty Medication(s) to be Shipped:   Transplant: Envarsus 1mg , Envarsus 4mg  and Myfortic 180mg     Other medication(s) to be shipped: gabapentin, sodium bicarb, tylenol, amlodipine, carverdilol and cinacalcet     Russell Wade, DOB: Mar 26, 1986  Phone: [phone] (home)       All above HIPAA information was verified with patient.     Was a Nurse, learning disability used for this call? No    Completed refill call assessment today to schedule patient's medication shipment from the [facility] [phone]).  All relevant notes have been reviewed.     Specialty medication(s) and dose(s) confirmed: Regimen is correct and unchanged.   Changes to medications: Derell reports no changes at this time.  Changes to insurance: No  New side effects reported not previously addressed with a pharmacist or physician: None reported  Questions for the pharmacist: No    Confirmed patient received a Conservation officer, historic buildings and a Surveyor, mining with first shipment. The patient will receive a drug information handout for each medication shipped and additional FDA Medication Guides as required.       DISEASE/MEDICATION-SPECIFIC INFORMATION        N/A    SPECIALTY MEDICATION ADHERENCE     Medication Adherence    Patient reported X missed doses in the last month: 0  Specialty Medication: Myfortic 180mg   Patient is on additional specialty medications: Yes  Additional Specialty Medications: Envarsus 1mg   Patient Reported Additional Medication X Missed Doses in the Last Month: 0  Patient is on more than two specialty medications: Yes  Specialty Medication: Envarsus 4mg   Patient Reported Additional Medication X Missed Doses in the Last Month: 0        Were doses missed due to medication being on hold? No    Myfortic 180 mg: 6 days of medicine on hand   Envarsus 1 mg: 9 days of medicine on hand   Envarsus 4 mg: 9 days of medicine on hand     REFERRAL TO PHARMACIST Referral to the pharmacist: Not needed      SHIPPING     Shipping address confirmed in Epic.     Delivery Scheduled: Yes, Expected medication delivery date: 10/14/2021.     Medication will be delivered via UPS to the prescription address in Epic WAM.    Lorelei Pont [facility] Specialty Technician

## 2021-10-11 DIAGNOSIS — D849 Immunodeficiency, unspecified: Principal | ICD-10-CM

## 2021-10-11 DIAGNOSIS — Z940 Kidney transplant status: Principal | ICD-10-CM

## 2021-10-11 MED ORDER — TACROLIMUS XR 4 MG TABLET,EXTENDED RELEASE 24 HR
ORAL_TABLET | Freq: Every day | ORAL | 11 refills | 30 days | Status: CP
Start: 2021-10-11 — End: 2022-10-11
  Filled 2021-10-11: qty 120, 30d supply, fill #0

## 2021-10-11 MED FILL — MYFORTIC 180 MG TABLET,DELAYED RELEASE: ORAL | 30 days supply | Qty: 120 | Fill #6

## 2021-10-11 MED FILL — ACETAMINOPHEN 500 MG TABLET: ORAL | 17 days supply | Qty: 100 | Fill #5

## 2021-10-11 MED FILL — SODIUM BICARBONATE 650 MG TABLET: ORAL | 30 days supply | Qty: 120 | Fill #6

## 2021-10-11 MED FILL — CINACALCET 30 MG TABLET: ORAL | 30 days supply | Qty: 30 | Fill #6

## 2021-10-11 MED FILL — CARVEDILOL 6.25 MG TABLET: ORAL | 90 days supply | Qty: 180 | Fill #3

## 2021-10-11 MED FILL — ENVARSUS XR 1 MG TABLET,EXTENDED RELEASE: ORAL | 30 days supply | Qty: 60 | Fill #1

## 2021-10-11 MED FILL — AMLODIPINE 5 MG TABLET: ORAL | 30 days supply | Qty: 60 | Fill #7

## 2021-10-26 ENCOUNTER — Encounter (HOSPITAL_BASED_OUTPATIENT_CLINIC_OR_DEPARTMENT_OTHER): Payer: Self-pay

## 2021-10-26 ENCOUNTER — Emergency Department (HOSPITAL_BASED_OUTPATIENT_CLINIC_OR_DEPARTMENT_OTHER): Payer: Medicare Other | Admitting: Radiology

## 2021-10-26 ENCOUNTER — Emergency Department (HOSPITAL_BASED_OUTPATIENT_CLINIC_OR_DEPARTMENT_OTHER)
Admission: EM | Admit: 2021-10-26 | Discharge: 2021-10-26 | Disposition: A | Discharge status: Home / Self Care | Payer: Medicare Other | Attending: Emergency Medicine | Admitting: Emergency Medicine

## 2021-10-26 DIAGNOSIS — X509XXA Other and unspecified overexertion or strenuous movements or postures, initial encounter: Secondary | ICD-10-CM | POA: Insufficient documentation

## 2021-10-26 DIAGNOSIS — M79644 Pain in right finger(s): Secondary | ICD-10-CM | POA: Diagnosis present

## 2021-10-26 NOTE — Discharge Instructions (Signed)
Follow-up with a hand surgeon as discussed.  Return to emergency room if you have any worsening symptoms.  You can use Tylenol for the pain. ?

## 2021-10-26 NOTE — ED Triage Notes (Signed)
He reports sudden pain and inability to extend right middle finger after "flicking a cigarette" this morning. He has some mildly painful edema at #3 m.c.p. joint area. ?

## 2021-10-26 NOTE — ED Provider Notes (Signed)
?[facility] EMERGENCY DEPT ?Provider Note ? ? ?CSN: [id number] ?Arrival date & time: 10/26/21  0751 ? ?  ? ?History ? ?Chief Complaint  ?Patient presents with  ? Hand Problem  ? ? ?[name] is a 36 y.o. male. ? ?Patient is a 36 year old male who presents with pain in his right middle finger.  He says that he popped it last night and felt a little bit of tenderness when he did that.  Then about 530 this morning he flicked a cigarette and noticed pain and swelling to the knuckle at that point.  He has been having some increased pain since that happened.  He has difficulty straightening it.  No prior injuries.  He is right-hand dominant. ? ? ?  ? ?Home Medications ?Prior to Admission medications   ?Medication Sig Start Date End Date Taking? Authorizing Provider  ?acetaminophen (TYLENOL) 325 MG tablet Take 650 mg by mouth daily as needed.    [provider]  ?albuterol (VENTOLIN HFA) 108 (90 Base) MCG/ACT inhaler Inhale 1-2 puffs into the lungs every 6 (six) hours as needed for wheezing or shortness of breath. 04/08/21   Tacy Learn, PA-C  ?amLODipine (NORVASC) 5 MG tablet Take 5 mg by mouth daily. 07/11/19   [provider]  ?budesonide-formoterol (SYMBICORT) 80-4.5 MCG/ACT inhaler Inhale 2 puffs into the lungs 2 (two) times daily. 04/08/21   Tacy Learn, PA-C  ?carvedilol (COREG) 6.25 MG tablet Take 6.25 mg by mouth 2 (two) times daily. 06/28/19   [provider]  ?doxycycline (VIBRAMYCIN) 100 MG capsule Take 1 capsule (100 mg total) by mouth 2 (two) times daily. 04/08/21   Tacy Learn, PA-C  ?MYFORTIC 180 MG EC tablet Take 540 mg by mouth 2 (two) times daily. 02/24/19   [provider]  ?Tacrolimus ER 1 MG TB24 Take 2 mg by mouth daily. 10/01/20   [provider]  ?Tacrolimus ER 4 MG TB24 Take by mouth. 07/01/19   [provider]  ?zolpidem (AMBIEN) 10 MG tablet Take 10 mg by mouth at bedtime as needed. 02/24/19   [provider]   ?   ? ?Allergies    ?Tuberculin ppd   ? ?Review of Systems   ?Review of Systems  ?Constitutional:  Negative for fever.  ?Gastrointestinal:  Negative for nausea and vomiting.  ?Musculoskeletal:  Positive for arthralgias and joint swelling. Negative for back pain and neck pain.  ?Skin:  Negative for wound.  ?Neurological:  Negative for weakness, numbness and headaches.  ? ?Physical Exam ?Updated Vital Signs ?BP 128/87 (BP Location: Right Arm)   Pulse 80   Temp 98.6 ?F (37 ?C) (Oral)   Resp 16   SpO2 96%  ?Physical Exam ?Constitutional:   ?   Appearance: He is well-developed.  ?HENT:  ?   Head: Normocephalic and atraumatic.  ?Cardiovascular:  ?   Rate and Rhythm: Normal rate.  ?Pulmonary:  ?   Effort: Pulmonary effort is normal.  ?Musculoskeletal:     ?   General: Tenderness present.  ?   Cervical back: Normal range of motion and neck supple.  ?   Comments: Positive swelling over the right third MCP joint.  There is tenderness to this area.  He is able to flex and extend the finger although extension is limited due to his discomfort.  He can extend against resistance.  He has normal sensation and motor function distally.  Capillary refills less than 2.  No warmth or erythema.  ?  Skin: ?   General: Skin is warm and dry.  ?Neurological:  ?   Mental Status: He is alert and oriented to person, place, and time.  ? ? ?ED Results / Procedures / Treatments   ?Labs ?(all labs ordered are listed, but only abnormal results are displayed) ?Labs Reviewed - No data to display ? ?EKG ?None ? ?Radiology ?DG Hand Complete Right ? ?Result Date: 10/26/2021 ?CLINICAL DATA:  Pain and swelling third metacarpal phalangeal joint with inability to extend right middle finger. EXAM: RIGHT HAND - COMPLETE 3+ VIEW COMPARISON:  None Available. FINDINGS: There is no evidence of fracture or dislocation. There is no evidence of arthropathy or other focal bone abnormality. Soft tissues are unremarkable. IMPRESSION: Negative. Electronically Signed    By: Kerby Moors M.D.   On: 10/26/2021 09:03   ? ?Procedures ?Procedures  ? ? ?Medications Ordered in ED ?Medications - No data to display ? ?ED Course/ Medical Decision Making/ A&P ?  ?                        ?Medical Decision Making ?Amount and/or Complexity of Data Reviewed ?Radiology: ordered. ? ? ?Patient is a 36 year old male who presents with pain in his right MCP  joint.  He has some minor swelling in this area.  X-rays were obtained which were interpreted by me to show no acute bony injury.  He potentially on clinical exam has a ligamentous versus tendon injury.  We will refer him to follow-up with hand surgery.  I advised him that he will have to call make an appointment.  He was advised on ice and elevation.  On chart review, he is status post kidney transplant.  He cannot take ibuprofen but can take Tylenol.  Return precautions were given. ? ?Final Clinical Impression(s) / ED Diagnoses ?Final diagnoses:  ?Finger pain, right  ? ? ?Rx / DC Orders ?ED Discharge Orders   ? ? None  ? ?  ? ? ?  ?Malvin Johns, MD ?10/26/21 [phone] ? ?

## 2021-11-05 NOTE — Unmapped (Signed)
[facility] Clinical Assessment & Refill Coordination Note    Russell Wade, DOB: 21-Oct-1985  Phone: [phone] (home)     All above HIPAA information was verified with patient.     Was a Nurse, learning disability used for this call? No    Specialty Medication(s):   Transplant: Envarsus 1mg , Envarsus 4mg  and Myfortic 180mg      Current Outpatient Medications   Medication Sig Dispense Refill   ??? acetaminophen (TYLENOL) 500 MG tablet Take 1-2 tablets (500-1,000 mg total) by mouth every eight (8) hours as needed for pain or fever (> 38C). Max dose 3000 mg per day 100 tablet 11   ??? amLODIPine (NORVASC) 5 MG tablet Take 2 tablets (10 mg total) by mouth daily. 60 tablet 11   ??? carvediloL (COREG) 6.25 MG tablet TAKE 1 TABLET (6.25 MG TOTAL) BY MOUTH TWO (2) TIMES A DAY. 180 tablet 3   ??? cinacalcet (SENSIPAR) 30 MG tablet Take 1 tablet (30 mg total) by mouth daily. 30 tablet 11   ??? gabapentin (NEURONTIN) 100 MG capsule Take 1 capsule (100 mg total) by mouth Three (3) times a day as needed. 270 capsule 3   ??? mycophenolate (MYFORTIC) 180 MG EC tablet Take 2 tablets (360 mg total) by mouth Two (2) times a day. 120 tablet 11   ??? sodium bicarbonate 650 mg tablet Take 2 tablets (1,300 mg total) by mouth Two (2) times a day. 120 tablet 11   ??? sodium zirconium cyclosilicate (LOKELMA) 5 gram PwPk packet Take 2 packets (10 g total) by mouth Three (3) times a day. 180 packet 11   ??? tacrolimus (ENVARSUS XR) 1 mg Tb24 extended release tablet Take 2 tablets (2 mg total) by mouth daily with 4 (4 mg) tablets for a total dose of 18 mg daily 60 tablet 11   ??? tacrolimus (ENVARSUS XR) 4 mg Tb24 extended release tablet Take 4 tablets (16 mg total) by mouth in the morning. 120 tablet 11   ??? zolpidem (AMBIEN) 10 mg tablet Take 10 mg by mouth nightly as needed for sleep.       No current facility-administered medications for this visit.        Changes to medications: Russell Wade reports no changes at this time.    Allergies Allergen Reactions   ??? Tuberculin Ppd      Other reaction(s): Skin Rash       Changes to allergies: No    SPECIALTY MEDICATION ADHERENCE     envarsus 1mg   : 10 days of medicine on hand   envarsus 4mg   : 10 days of medicine on hand   Myfortic 180mg   : 10 days of medicine on hand       Medication Adherence    Patient reported X missed doses in the last month: 0  Specialty Medication: envarsus 1mg   Patient is on additional specialty medications: Yes  Additional Specialty Medications: Envarsus 4mg   Patient Reported Additional Medication X Missed Doses in the Last Month: 0  Patient is on more than two specialty medications: Yes  Specialty Medication: myfortic 180mg   Patient Reported Additional Medication X Missed Doses in the Last Month: 0          Specialty medication(s) dose(s) confirmed: Regimen is correct and unchanged.     Are there any concerns with adherence? No    Adherence counseling provided? Not needed    CLINICAL MANAGEMENT AND INTERVENTION      Clinical Benefit Assessment:  Do you feel the medicine is effective or helping your condition? Yes    Clinical Benefit counseling provided? Not needed    Adverse Effects Assessment:    Are you experiencing any side effects? No    Are you experiencing difficulty administering your medicine? No    Quality of Life Assessment:         How many days over the past month did your transplant  keep you from your normal activities? For example, brushing your teeth or getting up in the morning. 0    Have you discussed this with your provider? Not needed    Acute Infection Status:    Acute infections noted within Epic:  No active infections  Patient reported infection: None    Therapy Appropriateness:    Is therapy appropriate and patient progressing towards therapeutic goals? Yes, therapy is appropriate and should be continued    DISEASE/MEDICATION-SPECIFIC INFORMATION      N/A    PATIENT SPECIFIC NEEDS     - Does the patient have any physical, cognitive, or cultural barriers? No    - Is the patient high risk? Yes, patient is taking a REMS drug. Medication is dispensed in compliance with REMS program    - Does the patient require a Care Management Plan? No     SOCIAL DETERMINANTS OF HEALTH     At the [facility], we have learned that life circumstances - like trouble affording food, housing, utilities, or transportation can affect the health of many of our patients.   That is why we wanted to ask: are you currently experiencing any life circumstances that are negatively impacting your health and/or quality of life? Yes    Social Determinants of Health     Financial Resource Strain: Medium Risk   ??? Difficulty of Paying Living Expenses: Somewhat hard   Internet Connectivity: Not on file   Food Insecurity: No Food Insecurity   ??? Worried About Running Out of Food in the Last Year: Never true   ??? Ran Out of Food in the Last Year: Never true   Tobacco Use: Medium Risk   ??? Smoking Tobacco Use: Former   ??? Smokeless Tobacco Use: Never   ??? Passive Exposure: Not on file   Housing/Utilities: Low Risk    ??? Within the past 12 months, have you ever stayed: outside, in a car, in a tent, in an overnight shelter, or temporarily in someone else's home (i.e. couch-surfing)?: No   ??? Are you worried about losing your housing?: No   ??? Within the past 12 months, have you been unable to get utilities (heat, electricity) when it was really needed?: No   Alcohol Use: Not At Risk   ??? How often do you have a drink containing alcohol?: 2 - 4 times per month   ??? How many drinks containing alcohol do you have on a typical day when you are drinking?: 3 - 4   ??? How often do you have 5 or more drinks on one occasion?: Never   Transportation Needs: No Transportation Needs   ??? Lack of Transportation (Medical): No   ??? Lack of Transportation (Non-Medical): No   Substance Use: Low Risk    ??? Taken prescription drugs for non-medical reasons: Never   ??? Taken illegal drugs: Never   ??? Patient indicated they have taken drugs in the past year for non-medical reasons: Yes, [positive answer(s)]: Not on file   Health Literacy: Low Risk    ??? :  Never   Physical Activity: Inactive   ??? Days of Exercise per Week: 0 days   ??? Minutes of Exercise per Session: 0 min   Interpersonal Safety: Not on file   Stress: Stress Concern Present   ??? Feeling of Stress : Rather much   Intimate Partner Violence: Not At Risk   ??? Fear of Current or Ex-Partner: No   ??? Emotionally Abused: No   ??? Physically Abused: No   ??? Sexually Abused: No   Depression: Not at risk   ??? PHQ-2 Score: 0   Social Connections: Socially Isolated   ??? Frequency of Communication with Friends and Family: More than three times a week   ??? Frequency of Social Gatherings with Friends and Family: More than three times a week   ??? Attends Religious Services: Never   ??? Active Member of Clubs or Organizations: No   ??? Attends Banker Meetings: Never   ??? Marital Status: Separated       Would you be willing to receive help with any of the needs that you have identified today? Yes   Messaged JH to enter referral.      SHIPPING     Specialty Medication(s) to be Shipped:   Transplant: Envarsus 1mg , Envarsus 4mg  and Myfortic 180mg     Other medication(s) to be shipped: tylenol, norvasc, sensipar, sodium bicarb     Changes to insurance: No    Delivery Scheduled: Yes, Expected medication delivery date: 11/12/2021.     Medication will be delivered via UPS to the confirmed prescription address in [facility].  Note new address given today and entered as prescription address:  [address] [place] [address]      The patient will receive a drug information handout for each medication shipped and additional FDA Medication Guides as required.  Verified that patient has previously received a Conservation officer, historic buildings and a Surveyor, mining.    The patient or caregiver noted above participated in the development of this care plan and knows that they can request review of or adjustments to the care plan at any time.      All of the patient's questions and concerns have been addressed.    [name]   [facility] Specialty Pharmacist

## 2021-11-08 MED FILL — SODIUM BICARBONATE 650 MG TABLET: ORAL | 30 days supply | Qty: 120 | Fill #7

## 2021-11-08 MED FILL — MYFORTIC 180 MG TABLET,DELAYED RELEASE: ORAL | 30 days supply | Qty: 120 | Fill #7

## 2021-11-08 MED FILL — AMLODIPINE 5 MG TABLET: ORAL | 30 days supply | Qty: 60 | Fill #8

## 2021-11-08 MED FILL — ACETAMINOPHEN 500 MG TABLET: ORAL | 17 days supply | Qty: 100 | Fill #6

## 2021-11-08 MED FILL — CINACALCET 30 MG TABLET: ORAL | 30 days supply | Qty: 30 | Fill #7

## 2021-11-08 MED FILL — ENVARSUS XR 1 MG TABLET,EXTENDED RELEASE: ORAL | 30 days supply | Qty: 60 | Fill #2

## 2021-11-08 MED FILL — ENVARSUS XR 4 MG TABLET,EXTENDED RELEASE: ORAL | 30 days supply | Qty: 120 | Fill #1

## 2021-12-04 NOTE — Unmapped (Signed)
[facility] Refill Coordination Note    Specialty Medication(s) to be Shipped:   Transplant: Envarsus 1mg , Envarsus 4mg , and Myfortic 180mg     Other medication(s) to be shipped:  gabapentin , tylenol , cinacalcet, amlodipine and sodium bicarb     Russell Wade, DOB: [birth year]  Phone: [phone] (home)       All above HIPAA information was verified with patient.     Was a Nurse, learning disability used for this call? No    Completed refill call assessment today to schedule patient's medication shipment from the [facility] [phone]).  All relevant notes have been reviewed.     Specialty medication(s) and dose(s) confirmed: Regimen is correct and unchanged.   Changes to medications: Russell Wade reports no changes at this time.  Changes to insurance: No  New side effects reported not previously addressed with a pharmacist or physician: None reported  Questions for the pharmacist: No    Confirmed patient received a Conservation officer, historic buildings and a Surveyor, mining with first shipment. The patient will receive a drug information handout for each medication shipped and additional FDA Medication Guides as required.       DISEASE/MEDICATION-SPECIFIC INFORMATION        N/A    SPECIALTY MEDICATION ADHERENCE     Medication Adherence    Patient reported X missed doses in the last month: 0  Specialty Medication: Envarsus 4 mg  Patient is on additional specialty medications: Yes  Additional Specialty Medications: Envarsus 1 MG   Patient Reported Additional Medication X Missed Doses in the Last Month: 0  Patient is on more than two specialty medications: Yes  Specialty Medication: Myfortic 180 mg  Patient Reported Additional Medication X Missed Doses in the Last Month: 0              Were doses missed due to medication being on hold? No    Envarsus 1 mg: 10 days of medicine on hand   Envarsus 4 mg: 10 days of medicine on hand   Myfortic 180 mg: 10 days of medicine on hand       REFERRAL TO PHARMACIST Referral to the pharmacist: Not needed      [facility]     Shipping address confirmed in Epic.     Delivery Scheduled: Yes, Expected medication delivery date: 12/10/21.     Medication will be delivered via UPS to the prescription address in Epic WAM.    Russell Wade   [facility] Specialty Technician

## 2021-12-09 MED FILL — SODIUM BICARBONATE 650 MG TABLET: ORAL | 30 days supply | Qty: 120 | Fill #8

## 2021-12-09 MED FILL — ENVARSUS XR 4 MG TABLET,EXTENDED RELEASE: ORAL | 30 days supply | Qty: 120 | Fill #2

## 2021-12-09 MED FILL — ENVARSUS XR 1 MG TABLET,EXTENDED RELEASE: ORAL | 30 days supply | Qty: 60 | Fill #3

## 2021-12-09 MED FILL — MYFORTIC 180 MG TABLET,DELAYED RELEASE: ORAL | 30 days supply | Qty: 120 | Fill #8

## 2021-12-09 MED FILL — CINACALCET 30 MG TABLET: ORAL | 30 days supply | Qty: 30 | Fill #8

## 2021-12-09 MED FILL — ACETAMINOPHEN 500 MG TABLET: ORAL | 17 days supply | Qty: 100 | Fill #7

## 2021-12-09 MED FILL — AMLODIPINE 5 MG TABLET: ORAL | 30 days supply | Qty: 60 | Fill #9

## 2022-01-03 ENCOUNTER — Encounter (HOSPITAL_COMMUNITY): Payer: Self-pay | Admitting: *Deleted

## 2022-01-03 ENCOUNTER — Other Ambulatory Visit: Payer: Self-pay

## 2022-01-03 ENCOUNTER — Emergency Department (HOSPITAL_COMMUNITY)
Admission: EM | Admit: 2022-01-03 | Discharge: 2022-01-03 | Disposition: A | Discharge status: Home / Self Care | Payer: Medicare Other | Attending: Emergency Medicine | Admitting: Emergency Medicine

## 2022-01-03 ENCOUNTER — Ambulatory Visit: Admit: 2022-01-03 | Discharge: 2022-01-04 | Payer: MEDICARE | Attending: Nephrology | Primary: Nephrology

## 2022-01-03 DIAGNOSIS — E875 Hyperkalemia: Principal | ICD-10-CM

## 2022-01-03 DIAGNOSIS — D849 Immunodeficiency, unspecified: Principal | ICD-10-CM

## 2022-01-03 DIAGNOSIS — Z940 Kidney transplant status: Principal | ICD-10-CM

## 2022-01-03 DIAGNOSIS — N2581 Secondary hyperparathyroidism of renal origin: Principal | ICD-10-CM

## 2022-01-03 DIAGNOSIS — I10 Essential (primary) hypertension: Principal | ICD-10-CM

## 2022-01-03 DIAGNOSIS — N186 End stage renal disease: Secondary | ICD-10-CM | POA: Diagnosis not present

## 2022-01-03 DIAGNOSIS — Z992 Dependence on renal dialysis: Secondary | ICD-10-CM | POA: Insufficient documentation

## 2022-01-03 DIAGNOSIS — Z79899 Other long term (current) drug therapy: Secondary | ICD-10-CM | POA: Insufficient documentation

## 2022-01-03 DIAGNOSIS — R110 Nausea: Secondary | ICD-10-CM | POA: Diagnosis not present

## 2022-01-03 DIAGNOSIS — I120 Hypertensive chronic kidney disease with stage 5 chronic kidney disease or end stage renal disease: Secondary | ICD-10-CM | POA: Insufficient documentation

## 2022-01-03 DIAGNOSIS — R109 Unspecified abdominal pain: Secondary | ICD-10-CM | POA: Diagnosis present

## 2022-01-03 DIAGNOSIS — R197 Diarrhea, unspecified: Secondary | ICD-10-CM

## 2022-01-03 LAB — COMPREHENSIVE METABOLIC PANEL
ALT: 12 U/L (ref 0–44)
AST: 13 U/L — ABNORMAL LOW (ref 15–41)
Albumin: 4.3 g/dL (ref 3.5–5.0)
Alkaline Phosphatase: 108 U/L (ref 38–126)
Anion gap: 6 (ref 5–15)
BUN: 30 mg/dL — ABNORMAL HIGH (ref 6–20)
CO2: 22 mmol/L (ref 22–32)
Calcium: 10 mg/dL (ref 8.9–10.3)
Chloride: 110 mmol/L (ref 98–111)
Creatinine, Ser: 2.11 mg/dL — ABNORMAL HIGH (ref 0.61–1.24)
GFR, Estimated: 41 mL/min — ABNORMAL LOW (ref >60)
Glucose, Bld: 102 mg/dL — ABNORMAL HIGH (ref 70–99)
Potassium: 4.8 mmol/L (ref 3.5–5.1)
Sodium: 138 mmol/L (ref 135–145)
Total Bilirubin: 0.6 mg/dL (ref 0.3–1.2)
Total Protein: 7.6 g/dL (ref 6.5–8.1)

## 2022-01-03 LAB — CBC
HCT: 39.5 % (ref 39.0–52.0)
Hemoglobin: 12.6 g/dL — ABNORMAL LOW (ref 13.0–17.0)
MCH: 26.6 pg (ref 26.0–34.0)
MCHC: 31.9 g/dL (ref 30.0–36.0)
MCV: 83.3 fL (ref 80.0–100.0)
Platelets: 196 10*3/uL (ref 150–400)
RBC: 4.74 MIL/uL (ref 4.22–5.81)
RDW: 15.7 % — ABNORMAL HIGH (ref 11.5–15.5)
WBC: 5.4 10*3/uL (ref 4.0–10.5)
nRBC: 0 % (ref 0.0–0.2)

## 2022-01-03 LAB — LIPASE, BLOOD: Lipase: 30 U/L (ref 11–51)

## 2022-01-03 MED ORDER — ONDANSETRON 4 MG PO TBDP: 4.0000 mg | ORAL_TABLET | Freq: Three times a day (TID) | ORAL | 0 refills | Status: AC | PRN
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
## 2022-01-03 NOTE — Unmapped (Signed)
Saw patient in clinic    Went to ED this AM for stomach pain, nausea, and diarrhea at [facility]. Had labs    +diarrhea and pain started Tuesday. 4-5 times a day.    Drinking about 6 bottles    Sleeping: off with working night shift    BP: not checking    +dizziness today and yesterday    Denies CP, SOB, swelling, vomiting, urinary problems, numbness/tingling, tremors, or fevers    Will get labs and took envarsus at 11am    Not taking sensipar: upsetting stomach for past month

## 2022-01-03 NOTE — ED Triage Notes (Signed)
Patient coming to ED for evaluation of lower abdominal pain and diarrhea.  Reports pain "is on the same side as my transplanted kidney."  No reports of fevers.  Symptoms started on Tuesday.

## 2022-01-03 NOTE — ED Provider Notes (Signed)
[place] DEPT Provider Note  CSN: [id number] Arrival date & time: 01/03/22 2774  Chief Complaint(s) Abdominal Pain  HPI [name] is a 36 y.o. 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  Patient endorses mild nausea without emesis.  He denies any recent fevers or infections.  No suspicious food intake.  No sick contacts.  No recent antibiotic use.  Currently denies any abdominal discomfort.  States that he wants to check his kidney function.  Reports that he has follow-up with his nephrologist later this morning  The history is provided by the patient.    Past Medical History Past Medical History:  Diagnosis Date   Alport syndrome    Alport's syndrome    EFFECT KIDNEYS+ HEARING   Anemia    Blood dyscrasia    GERD (gastroesophageal reflux disease)    GERD (gastroesophageal reflux disease)    Headache    Heart murmur    ASYMPTOMATIC   Kidney failure as a baby   went on dialysis age 89      T- TH-SAT   Sickle cell trait ([place])    Patient Active Problem List   Diagnosis Date Noted   Secondary hyperparathyroidism of renal origin ([place]) 12/03/2015   Hyperparathyroidism, secondary ([place]) 12/02/2015   ESRD on dialysis ([place]) 12/02/2015   Acute dyspnea 04/25/2014   H/O kidney transplant 11/12/2007   Alport syndrome 06/29/1995   Essential (primary) hypertension 06/19/1995   Home Medication(s) Prior to Admission medications   Medication Sig Start Date End Date Taking? Authorizing Provider  acetaminophen (TYLENOL) 325 MG tablet Take 650 mg by mouth daily as needed for mild pain.   Yes [provider]  amLODipine (NORVASC) 5 MG tablet Take 10 mg by mouth daily. 07/11/19  Yes [provider]  budesonide-formoterol (SYMBICORT) 80-4.5  MCG/ACT inhaler Inhale 2 puffs into the lungs 2 (two) times daily. Patient taking differently: Inhale 2 puffs into the lungs daily as needed (for shortness of breath). 04/08/21  Yes Tacy Learn, PA-C  carvedilol (COREG) 6.25 MG tablet Take 6.25 mg by mouth 2 (two) times daily. 06/28/19  Yes [provider]  mycophenolate (MYFORTIC) 180 MG EC tablet Take 540 mg by mouth 2 (two) times daily. 02/24/19  Yes [provider]  ondansetron (ZOFRAN-ODT) 4 MG disintegrating tablet Take 1 tablet (4 mg total) by mouth every 8 (eight) hours as needed for up to 3 days for nausea or vomiting. 01/03/22 01/06/22 Yes Dejohn Ibarra, Grayce Sessions, MD  albuterol (VENTOLIN HFA) 108 (90 Base) MCG/ACT inhaler Inhale 1-2 puffs into the lungs every 6 (six) hours as needed for wheezing or shortness of breath. Patient not taking: Reported on 01/03/2022 04/08/21   Suella Broad A, PA-C  doxycycline (VIBRAMYCIN) 100 MG capsule Take 1 capsule (100 mg total) by mouth 2 (two) times daily. 04/08/21   Tacy Learn, PA-C  Tacrolimus ER 1 MG TB24 Take 2 mg by mouth daily. 10/01/20   [provider]  Tacrolimus ER 4 MG TB24 Take by mouth. 07/01/19   [provider]  zolpidem (AMBIEN) 10 MG tablet Take 10 mg by mouth at bedtime as needed. 02/24/19   [provider]  Allergies Tuberculin ppd  Review of Systems Review of Systems As noted in HPI  Physical Exam Vital Signs  I have reviewed the triage vital signs BP 115/71 (BP Location: Right Arm)   Pulse 74   Temp 98 F (36.7 C) (Oral)   Resp 18   Ht $R'5\' 9"'oB$  (1.753 m)   Wt 65.8 kg   SpO2 100%   BMI 21.41 kg/m   Physical Exam Vitals reviewed.  Constitutional:      General: He is not in acute distress.    Appearance: He is well-developed. He is not diaphoretic.  HENT:     Head: Normocephalic and atraumatic.      Right Ear: External ear normal.     Left Ear: External ear normal.     Nose: Nose normal.     Mouth/Throat:     Mouth: Mucous membranes are moist.  Eyes:     General: No scleral icterus.    Conjunctiva/sclera: Conjunctivae normal.  Neck:     Trachea: Phonation normal.  Cardiovascular:     Rate and Rhythm: Normal rate and regular rhythm.     Arteriovenous access: Left arteriovenous access is present. Pulmonary:     Effort: Pulmonary effort is normal. No respiratory distress.     Breath sounds: No stridor.  Abdominal:     General: There is no distension.     Tenderness: There is no abdominal tenderness.  Musculoskeletal:        General: Normal range of motion.     Cervical back: Normal range of motion.  Neurological:     Mental Status: He is alert and oriented to person, place, and time.  Psychiatric:        Behavior: Behavior normal.     ED Results and Treatments Labs (all labs ordered are listed, but only abnormal results are displayed) Labs Reviewed  COMPREHENSIVE METABOLIC PANEL - Abnormal; Notable for the following components:      Result Value   Glucose, Bld 102 (*)    BUN 30 (*)    Creatinine, Ser 2.11 (*)    AST 13 (*)    GFR, Estimated 41 (*)    All other components within normal limits  CBC - Abnormal; Notable for the following components:   Hemoglobin 12.6 (*)    RDW 15.7 (*)    All other components within normal limits  LIPASE, BLOOD  URINALYSIS, ROUTINE W REFLEX MICROSCOPIC                                                                                                                         EKG  EKG Interpretation  Date/Time:    Ventricular Rate:    PR Interval:    QRS Duration:   QT Interval:    QTC Calculation:   R Axis:     Text Interpretation:         Radiology No results found.  Pertinent labs & imaging results that were available during my care  of the patient were reviewed by me and considered in my medical decision making (see  MDM for details).  Medications Ordered in ED Medications - No data to display                                                                                                                                   Procedures Procedures  (including critical care time)  Medical Decision Making / ED Course    Complexity of Problem:  Patient's presenting problem/concern, DDX, and MDM listed below: Diarrhea with abd cramping Abdomen benign We will obtain screening labs to assess for any electrolyte or metabolic derangements and assess for his renal function. Low suspicion for serious intra-abdominal inflammatory/infectious process requiring imaging at this time    Complexity of Data:   Laboratory Tests ordered listed below with my independent interpretation: CBC without leukocytosis or anemia Metabolic panel without significant electrolyte derangement.  Baseline renal function. No evidence of bili obstruction or pancreatitis.    ED Course:    Assessment, Add'l Intervention, and Reassessment: Diarrhea Patient is well-appearing and well-hydrated. Low suspicion for serious intra-abdominal inflammatory/infectious process requiring imaging or antibiotics at this time Recommended continue supportive management He already has close follow-up with nephrologist later on the day    Final Clinical Impression(s) / ED Diagnoses Final diagnoses:  Diarrhea, unspecified type  Abdominal cramping   The patient appears reasonably screened and/or stabilized for discharge and I doubt any other medical condition or other [facility] requiring further screening, evaluation, or treatment in the ED at this time prior to discharge. Safe for discharge with strict return precautions.  Disposition: Discharge  Condition: Good  I have discussed the results, Dx and Tx plan with the patient/family who expressed understanding and agree(s) with the plan. Discharge instructions discussed at length. The patient/family was  given strict return precautions who verbalized understanding of the instructions. No further questions at time of discharge.    ED Discharge Orders          Ordered    ondansetron (ZOFRAN-ODT) 4 MG disintegrating tablet  Every 8 hours PRN        01/03/22 0553             Follow Up: Nephrology  Go today as scheduled           This chart was dictated using voice recognition software.  Despite best efforts to proofread,  errors can occur which can change the documentation meaning.    Fatima Blank, MD 01/03/22 [phone]

## 2022-01-03 NOTE — ED Notes (Signed)
Extra lab tubes sent to lab.

## 2022-01-10 MED ORDER — CARVEDILOL 6.25 MG TABLET
ORAL_TABLET | Freq: Two times a day (BID) | ORAL | 3 refills | 90 days
Start: 2022-01-10 — End: 2023-01-10

## 2022-01-10 NOTE — Unmapped (Signed)
[facility] Refill Coordination Note    Specialty Medication(s) to be Shipped:   Transplant: Envarsus 1mg , Envarsus 4mg , and Myfortic 180mg     Other medication(s) to be shipped:  gabapentin , tylenol , carvedilol, amlodipine and sodium bicarb     Russell Wade, DOB: October 07, 1985  Phone: [phone] (home)       All above HIPAA information was verified with patient.     Was a Nurse, learning disability used for this call? No    Completed refill call assessment today to schedule patient's medication shipment from the [facility] [phone]).  All relevant notes have been reviewed.     Specialty medication(s) and dose(s) confirmed: Regimen is correct and unchanged.   Changes to medications: Russell Wade reports no changes at this time.  Changes to insurance: No  New side effects reported not previously addressed with a pharmacist or physician: None reported  Questions for the pharmacist: No    Confirmed patient received a Conservation officer, historic buildings and a Surveyor, mining with first shipment. The patient will receive a drug information handout for each medication shipped and additional FDA Medication Guides as required.       DISEASE/MEDICATION-SPECIFIC INFORMATION        N/A    SPECIALTY MEDICATION ADHERENCE     Medication Adherence    Patient reported X missed doses in the last month: 0  Specialty Medication: Envaarsus 1 mg  Patient is on additional specialty medications: Yes  Additional Specialty Medications: Envarsus 4 mg   Patient Reported Additional Medication X Missed Doses in the Last Month: 0  Patient is on more than two specialty medications: Yes  Specialty Medication: Myfortic 180 mg  Patient Reported Additional Medication X Missed Doses in the Last Month: 0              Were doses missed due to medication being on hold? No    Envarsus 1 mg: 6 days of medicine on hand   Envarsus 4 mg: 6 days of medicine on hand   Myfortic 180 mg: 6 days of medicine on hand       REFERRAL TO PHARMACIST Referral to the pharmacist: Not needed      [facility]     Shipping address confirmed in Epic.     Delivery Scheduled: Yes, Expected medication delivery date: 01/14/22.     Medication will be delivered via UPS to the prescription address in Epic WAM.    Russell Wade   [facility] Specialty Technician

## 2022-01-13 MED ORDER — CARVEDILOL 6.25 MG TABLET
ORAL_TABLET | Freq: Two times a day (BID) | ORAL | 3 refills | 90 days | Status: CP
Start: 2022-01-13 — End: 2023-01-13
  Filled 2022-01-13: qty 180, 90d supply, fill #0

## 2022-01-13 MED FILL — SODIUM BICARBONATE 650 MG TABLET: ORAL | 30 days supply | Qty: 120 | Fill #9

## 2022-01-13 MED FILL — GABAPENTIN 100 MG CAPSULE: ORAL | 90 days supply | Qty: 270 | Fill #1

## 2022-01-13 MED FILL — ENVARSUS XR 1 MG TABLET,EXTENDED RELEASE: ORAL | 30 days supply | Qty: 60 | Fill #4

## 2022-01-13 MED FILL — ENVARSUS XR 4 MG TABLET,EXTENDED RELEASE: ORAL | 30 days supply | Qty: 120 | Fill #3

## 2022-01-13 MED FILL — ACETAMINOPHEN 500 MG TABLET: ORAL | 17 days supply | Qty: 100 | Fill #8

## 2022-01-13 MED FILL — MYFORTIC 180 MG TABLET,DELAYED RELEASE: ORAL | 30 days supply | Qty: 120 | Fill #9

## 2022-01-13 MED FILL — AMLODIPINE 5 MG TABLET: ORAL | 30 days supply | Qty: 60 | Fill #10

## 2022-01-13 NOTE — Unmapped (Signed)
The patient is requesting a medication refill

## 2022-01-17 NOTE — Unmapped (Signed)
Received a return call f rom pt.  Pt reports he is still having loose stool but it is improving on its own.  Pt will get labs next week and will ensure a good 24 hour trough.  Pt will also leave a stool sample.  Pt provided this TNCs direct number to call with questions and needs.

## 2022-01-17 NOTE — Unmapped (Signed)
Attempted to check in with patient to remind him to get labs and tac level and if still having loose stool to have GI studies done at [facility].  Unable to leave VM as mailbox is full.  Will send Mychart message of the above.

## 2022-01-20 DIAGNOSIS — Z940 Kidney transplant status: Principal | ICD-10-CM

## 2022-01-20 DIAGNOSIS — Z79899 Other long term (current) drug therapy: Principal | ICD-10-CM

## 2022-01-20 NOTE — Unmapped (Signed)
Lab orders were expiring. New orders entered.

## 2022-02-13 NOTE — Unmapped (Signed)
[facility] Refill Coordination Note    Specialty Medication(s) to be Shipped:   Transplant: Envarsus 1mg , Envarsus 4mg , and Myfortic 180mg     Other medication(s) to be shipped:  cinacalcet, tylenol,  amlodipine and sodium bicarb     Russell Wade, DOB: June 28, 1985  Phone: [phone] (home)       All above HIPAA information was verified with patient.     Was a Nurse, learning disability used for this call? No    Completed refill call assessment today to schedule patient's medication shipment from the [facility] [phone]).  All relevant notes have been reviewed.     Specialty medication(s) and dose(s) confirmed: Regimen is correct and unchanged.   Changes to medications: [employer] reports no changes at this time.  Changes to insurance: No  New side effects reported not previously addressed with a pharmacist or physician: None reported  Questions for the pharmacist: No    Confirmed patient received a Conservation officer, historic buildings and a Surveyor, mining with first shipment. The patient will receive a drug information handout for each medication shipped and additional FDA Medication Guides as required.       DISEASE/MEDICATION-SPECIFIC INFORMATION        N/A    SPECIALTY MEDICATION ADHERENCE     Medication Adherence    Specialty Medication: ENVARSUS XR 1 mg Tb24 extended release tablet (tacrolimus)  Patient is on additional specialty medications: Yes  Additional Specialty Medications: ENVARSUS XR 4 mg Tb24 extended release tablet (tacrolimus)  Patient Reported Additional Medication X Missed Doses in the Last Month: 0  Patient is on more than two specialty medications: Yes  Specialty Medication: MYFORTIC 180 MG EC tablet (mycophenolate)  Patient Reported Additional Medication X Missed Doses in the Last Month: 0                                Were doses missed due to medication being on hold? No    Envarsus 1 mg: 6 days of medicine on hand   Envarsus 4 mg: 6 days of medicine on hand   Myfortic 180 mg: 6 days of medicine on hand       REFERRAL TO PHARMACIST     Referral to the pharmacist: Not needed      [facility]     Shipping address confirmed in Epic.     Delivery Scheduled: Yes, Expected medication delivery date: 02/18/22.     Medication will be delivered via UPS to the prescription address in Epic WAM.    Russell Wade   [facility] Specialty Technician

## 2022-02-14 MED FILL — SODIUM BICARBONATE 650 MG TABLET: ORAL | 30 days supply | Qty: 120 | Fill #10

## 2022-02-14 MED FILL — MYFORTIC 180 MG TABLET,DELAYED RELEASE: ORAL | 30 days supply | Qty: 120 | Fill #10

## 2022-02-14 MED FILL — ENVARSUS XR 4 MG TABLET,EXTENDED RELEASE: ORAL | 30 days supply | Qty: 120 | Fill #4

## 2022-02-14 MED FILL — ACETAMINOPHEN 500 MG TABLET: ORAL | 17 days supply | Qty: 100 | Fill #9

## 2022-02-14 MED FILL — AMLODIPINE 5 MG TABLET: ORAL | 30 days supply | Qty: 60 | Fill #11

## 2022-02-14 MED FILL — ENVARSUS XR 1 MG TABLET,EXTENDED RELEASE: ORAL | 30 days supply | Qty: 60 | Fill #5

## 2022-02-14 MED FILL — CINACALCET 30 MG TABLET: ORAL | 30 days supply | Qty: 30 | Fill #9

## 2022-02-24 NOTE — Unmapped (Signed)
UNOS form

## 2022-03-07 MED ORDER — AMLODIPINE 5 MG TABLET
ORAL_TABLET | Freq: Every day | ORAL | 11 refills | 30 days | Status: CP
Start: 2022-03-07 — End: 2023-03-07
  Filled 2022-03-12: qty 60, 30d supply, fill #0

## 2022-03-07 NOTE — Unmapped (Signed)
[facility] Refill Coordination Note    Specialty Medication(s) to be Shipped:   Transplant: Envarsus 1mg , Envarsus 4mg , and Myfortic 180mg     Other medication(s) to be shipped:  amlodipine , cinacalcet and sodium      Russell Wade, DOB: 1986-05-27  Phone: [phone] (home)       All above HIPAA information was verified with patient.     Was a Nurse, learning disability used for this call? No    Completed refill call assessment today to schedule patient's medication shipment from the [facility] [phone]).  All relevant notes have been reviewed.     Specialty medication(s) and dose(s) confirmed: Regimen is correct and unchanged.   Changes to medications: Dauntae reports no changes at this time.  Changes to insurance: No  New side effects reported not previously addressed with a pharmacist or physician: None reported  Questions for the pharmacist: No    Confirmed patient received a Conservation officer, historic buildings and a Surveyor, mining with first shipment. The patient will receive a drug information handout for each medication shipped and additional FDA Medication Guides as required.       DISEASE/MEDICATION-SPECIFIC INFORMATION        N/A    SPECIALTY MEDICATION ADHERENCE     Medication Adherence    Patient reported X missed doses in the last month: 0  Specialty Medication: Envarsus 1 mg  Patient is on additional specialty medications: Yes  Additional Specialty Medications: Envarsus 4 mg   Patient Reported Additional Medication X Missed Doses in the Last Month: 0  Patient is on more than two specialty medications: Yes  Specialty Medication: Myfortic 180 mg  Patient Reported Additional Medication X Missed Doses in the Last Month: 0                                Were doses missed due to medication being on hold? No    Envarsus 1 mg: 9 days of medicine on hand   Envarsus 4 mg: 9 days of medicine on hand   Myfortic 180 mg: 9 days of medicine on hand       REFERRAL TO PHARMACIST     Referral to the pharmacist: Not needed      SHIPPING     Shipping address confirmed in Epic.     Delivery Scheduled: Yes, Expected medication delivery date: 03/12/22.     Medication will be delivered via UPS to the prescription address in Epic WAM.    [name]   [facility] Specialty Technician

## 2022-03-07 NOTE — Unmapped (Signed)
Pt request for RX Refill

## 2022-03-11 DIAGNOSIS — Z940 Kidney transplant status: Principal | ICD-10-CM

## 2022-03-11 MED ORDER — MYCOPHENOLATE SODIUM 180 MG TABLET,DELAYED RELEASE
ORAL_TABLET | Freq: Two times a day (BID) | ORAL | 11 refills | 30 days | Status: CP
Start: 2022-03-11 — End: 2023-03-11
  Filled 2022-03-12: qty 120, 30d supply, fill #0

## 2022-03-11 NOTE — Unmapped (Signed)
Russell Wade 's entire shipment will be delayed as a result of insufficient inventory of the drug.     I have reached out to the patient  and communicated the delay. We will reschedule the medication for the delivery date that the patient agreed upon.  We have confirmed the delivery date as 03/13/22, via ups.

## 2022-03-12 MED FILL — SODIUM BICARBONATE 650 MG TABLET: ORAL | 30 days supply | Qty: 120 | Fill #11

## 2022-03-12 MED FILL — CINACALCET 30 MG TABLET: ORAL | 30 days supply | Qty: 30 | Fill #10

## 2022-03-12 MED FILL — ENVARSUS XR 1 MG TABLET,EXTENDED RELEASE: ORAL | 30 days supply | Qty: 60 | Fill #6

## 2022-03-12 MED FILL — ENVARSUS XR 4 MG TABLET,EXTENDED RELEASE: ORAL | 30 days supply | Qty: 120 | Fill #5

## 2022-04-11 MED FILL — ACETAMINOPHEN 500 MG TABLET: ORAL | 17 days supply | Qty: 100 | Fill #10

## 2022-04-11 MED FILL — GABAPENTIN 100 MG CAPSULE: ORAL | 90 days supply | Qty: 270 | Fill #2

## 2022-04-17 NOTE — Unmapped (Signed)
[facility] Refill Coordination Note    Specialty Medication(s) to be Shipped:   Transplant: Envarsus 1mg , Envarsus 4mg , and Myfortic 180mg     Other medication(s) to be shipped:  amlodipine , carvedilol     Russell Wade, DOB: May 30, 1986  Phone: [phone] (home)       All above HIPAA information was verified with patient.     Was a Nurse, learning disability used for this call? No    Completed refill call assessment today to schedule patient's medication shipment from the [name] [phone]).  All relevant notes have been reviewed.     Specialty medication(s) and dose(s) confirmed: Regimen is correct and unchanged.   Changes to medications: Kentavious reports no changes at this time.  Changes to insurance: No  New side effects reported not previously addressed with a pharmacist or physician: None reported  Questions for the pharmacist: No    Confirmed patient received a Conservation officer, historic buildings and a Surveyor, mining with first shipment. The patient will receive a drug information handout for each medication shipped and additional FDA Medication Guides as required.       DISEASE/MEDICATION-SPECIFIC INFORMATION        N/A    SPECIALTY MEDICATION ADHERENCE     Medication Adherence    Patient reported X missed doses in the last month: 0  Specialty Medication: mycophenolate 180 MG EC tablet (MYFORTIC)  Patient is on additional specialty medications: Yes  Additional Specialty Medications: ENVARSUS XR 4 mg Tb24 extended release tablet (tacrolimus)  Patient Reported Additional Medication X Missed Doses in the Last Month: 0  Patient is on more than two specialty medications: Yes  Specialty Medication: ENVARSUS XR 1 mg Tb24 extended release tablet (tacrolimus)  Patient Reported Additional Medication X Missed Doses in the Last Month: 0                                Were doses missed due to medication being on hold? No    Envarsus 1 mg: 7 days of medicine on hand   Envarsus 4 mg: 7 days of medicine on hand   Myfortic 180 mg: 7 days of medicine on hand       REFERRAL TO PHARMACIST     Referral to the pharmacist: Not needed      [facility]     Shipping address confirmed in Epic.     Delivery Scheduled: Yes, Expected medication delivery date: 04/22/22.     Medication will be delivered via UPS to the prescription address in Epic WAM.    Russell Wade   [facility] Specialty Technician

## 2022-04-21 MED FILL — ENVARSUS XR 4 MG TABLET,EXTENDED RELEASE: ORAL | 30 days supply | Qty: 120 | Fill #6

## 2022-04-21 MED FILL — CARVEDILOL 6.25 MG TABLET: ORAL | 90 days supply | Qty: 180 | Fill #1

## 2022-04-21 MED FILL — AMLODIPINE 5 MG TABLET: ORAL | 30 days supply | Qty: 60 | Fill #1

## 2022-04-21 MED FILL — ENVARSUS XR 1 MG TABLET,EXTENDED RELEASE: ORAL | 30 days supply | Qty: 60 | Fill #7

## 2022-04-21 MED FILL — MYCOPHENOLATE SODIUM 180 MG TABLET,DELAYED RELEASE: ORAL | 30 days supply | Qty: 120 | Fill #1

## 2022-05-16 MED ORDER — ACETAMINOPHEN 500 MG TABLET
ORAL_TABLET | Freq: Three times a day (TID) | ORAL | 11 refills | 17 days | Status: CP | PRN
Start: 2022-05-16 — End: 2023-05-16
  Filled 2022-05-21: qty 100, 17d supply, fill #0

## 2022-05-16 NOTE — Unmapped (Signed)
Pt request for RX Refill

## 2022-05-16 NOTE — Unmapped (Signed)
[facility] Clinical Assessment & Refill Coordination Note    Russell Wade, DOB: [birth year]  Phone: [phone] (home)     All above HIPAA information was verified with patient.     Was a Nurse, learning disability used for this call? No    Specialty Medication(s):   Transplant: Envarsus 1mg , Envarsus 4mg  and  mycophenolic acid 180mg      Current Outpatient Medications   Medication Sig Dispense Refill   ??? acetaminophen (TYLENOL) 500 MG tablet Take 1-2 tablets (500-1,000 mg total) by mouth every eight (8) hours as needed for pain or fever (> 38C). Max dose 3000 mg per day 100 tablet 11   ??? amlodipine (NORVASC) 5 MG tablet Take 2 tablets (10 mg total) by mouth daily. 60 tablet 11   ??? carvediloL (COREG) 6.25 MG tablet TAKE 1 TABLET (6.25 MG TOTAL) BY MOUTH TWO (2) TIMES A DAY. 180 tablet 3   ??? cinacalcet (SENSIPAR) 30 MG tablet Take 1 tablet (30 mg total) by mouth daily. (Patient not taking: Reported on 01/03/2022) 30 tablet 11   ??? gabapentin (NEURONTIN) 100 MG capsule Take 1 capsule (100 mg total) by mouth Three (3) times a day as needed. 270 capsule 3   ??? mycophenolate (MYFORTIC) 180 MG EC tablet Take 2 tablets (360 mg total) by mouth Two (2) times a day. 120 tablet 11   ??? tacrolimus (ENVARSUS XR) 1 mg Tb24 extended release tablet Take 2 tablets (2 mg total) by mouth daily with 4 (4 mg) tablets for a total dose of 18 mg daily 60 tablet 11   ??? tacrolimus (ENVARSUS XR) 4 mg Tb24 extended release tablet Take 4 tablets (16 mg total) by mouth in the morning. 120 tablet 11   ??? zolpidem (AMBIEN) 10 mg tablet Take 10 mg by mouth nightly as needed for sleep. (Patient not taking: Reported on 01/03/2022)       No current facility-administered medications for this visit.        Changes to medications: Russell Wade reports no changes at this time.    Allergies   Allergen Reactions   ??? Tuberculin Ppd      Other reaction(s): Skin Rash       Changes to allergies: No    SPECIALTY MEDICATION ADHERENCE     envarsus 1mg   : 10 days of medicine on hand   envarsus 4mg   : 10 days of medicine on hand   Mycophenolate 180mg   : 10 days of medicine on hand       Medication Adherence    Patient reported X missed doses in the last month: 0  Specialty Medication: envarsus 1mg   Patient is on additional specialty medications: Yes  Additional Specialty Medications: Envarsus 4mg   Patient Reported Additional Medication X Missed Doses in the Last Month: 0  Patient is on more than two specialty medications: Yes  Specialty Medication: mycophenolate 180mg   Patient Reported Additional Medication X Missed Doses in the Last Month: 0                      Specialty medication(s) dose(s) confirmed: Regimen is correct and unchanged.     Are there any concerns with adherence? No    Adherence counseling provided? Not needed    CLINICAL MANAGEMENT AND INTERVENTION      Clinical Benefit Assessment:    Do you feel the medicine is effective or helping your condition? Yes    Clinical Benefit counseling provided? Not needed  Adverse Effects Assessment:    Are you experiencing any side effects? No    Are you experiencing difficulty administering your medicine? 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 For example, brushing your teeth or getting up in the morning. 0    Have you discussed this with your provider? Not needed    Acute Infection Status:    Acute infections noted within Epic:  No active infections  Patient reported infection: None    Therapy Appropriateness:    Is therapy appropriate and patient progressing towards therapeutic goals? Yes, therapy is appropriate and should be continued    DISEASE/MEDICATION-SPECIFIC INFORMATION      N/A    Solid Organ Transplant: Not Applicable    PATIENT SPECIFIC NEEDS     - Does the patient have any physical, cognitive, or cultural barriers? No    - Is the patient high risk? Yes, patient is taking a REMS drug. Medication is dispensed in compliance with REMS program    - Did the patient require a clinical intervention? No    - Does the patient require physician intervention or other additional services (i.e., nutrition, smoking cessation, social work)? No    SOCIAL DETERMINANTS OF HEALTH     At the [facility], we have learned that life circumstances - like trouble affording food, housing, utilities, or transportation can affect the health of many of our patients.   That is why we wanted to ask: are you currently experiencing any life circumstances that are negatively impacting your health and/or quality of life? No    Social Determinants of Health     Financial Resource Strain: Medium Risk (03/26/2021)    Overall Financial Resource Strain (CARDIA)    ??? Difficulty of Paying Living Expenses: Somewhat hard   Internet Connectivity: Not on file   Food Insecurity: No Food Insecurity (03/26/2021)    Hunger Vital Sign    ??? Worried About Running Out of Food in the Last Year: Never true    ??? Ran Out of Food in the Last Year: Never true   Tobacco Use: High Risk (01/03/2022)    Patient History    ??? Smoking Tobacco Use: Former    ??? Smokeless Tobacco Use: Current    ??? Passive Exposure: Not on file   Housing/Utilities: Low Risk  (03/26/2021)    Housing/Utilities    ??? Within the past 12 months, have you ever stayed: outside, in a car, in a tent, in an overnight shelter, or temporarily in someone else's home (i.e. couch-surfing)?: No    ??? Are you worried about losing your housing?: No    ??? Within the past 12 months, have you been unable to get utilities (heat, electricity) when it was really needed?: No   Alcohol Use: Not At Risk (03/26/2021)    Alcohol Use    ??? How often do you have a drink containing alcohol?: 2 - 4 times per month    ??? How many drinks containing alcohol do you have on a typical day when you are drinking?: 3 - 4    ??? How often do you have 5 or more drinks on one occasion?: Never   Transportation Needs: No Transportation Needs (03/26/2021)    PRAPARE -  Transportation    ??? Lack of Transportation (Medical): No    ??? Lack of Transportation (Non-Medical): No   Substance Use: Low Risk  (03/26/2021)    Substance Use    ??? Taken prescription drugs for non-medical reasons: Never    ??? Taken illegal drugs: Never    ??? Patient indicated they have taken drugs in the past year for non-medical reasons: Yes, [positive answer(s)]: Not on file   Health Literacy: Low Risk  (03/26/2021)    Health Literacy    ??? : Never   Physical Activity: Inactive (03/26/2021)    Exercise Vital Sign    ??? Days of Exercise per Week: 0 days    ??? Minutes of Exercise per Session: 0 min   Interpersonal Safety: Not on file   Stress: Stress Concern Present (03/26/2021)    [employer] of Occupational Health - Occupational Stress Questionnaire    ??? Feeling of Stress : Rather much   Intimate Partner Violence: Not At Risk (03/26/2021)    Humiliation, Afraid, Rape, and Kick questionnaire    ??? Fear of Current or Ex-Partner: No    ??? Emotionally Abused: No    ??? Physically Abused: No    ??? Sexually Abused: No   Depression: Not at risk (04/04/2021)    PHQ-2    ??? PHQ-2 Score: 0   Social Connections: Socially Isolated (03/26/2021)    Social Connection and Isolation Panel [NHANES]    ??? Frequency of Communication with Friends and Family: More than three times a week    ??? Frequency of Social Gatherings with Friends and Family: More than three times a week    ??? Attends Religious Services: Never    ??? Active Member of Clubs or Organizations: No    ??? Attends Banker Meetings: Never    ??? Marital Status: Separated       Would you be willing to receive help with any of the needs that you have identified today? Not applicable       SHIPPING     Specialty Medication(s) to be Shipped:   Transplant: Envarsus 1mg , Envarsus 4mg  and  mycophenolic acid 180mg     Other medication(s) to be shipped: amlodipine, acetaminophen     Changes to insurance: No    Delivery Scheduled: Yes, Expected medication delivery date: 05/21/2022.  However, Rx request for refills was sent to the provider as there are none remaining.     Medication will be delivered via UPS to the confirmed prescription address in [facility].    The patient will receive a drug information handout for each medication shipped and additional FDA Medication Guides as required.  Verified that patient has previously received a Conservation officer, historic buildings and a Surveyor, mining.    The patient or caregiver noted above participated in the development of this care plan and knows that they can request review of or adjustments to the care plan at any time.      All of the patient's questions and concerns have been addressed.    [name], PharmD   [facility] Specialty Pharmacist

## 2022-05-21 MED FILL — MYCOPHENOLATE SODIUM 180 MG TABLET,DELAYED RELEASE: ORAL | 30 days supply | Qty: 120 | Fill #2

## 2022-05-21 MED FILL — AMLODIPINE 5 MG TABLET: ORAL | 30 days supply | Qty: 60 | Fill #2

## 2022-05-21 MED FILL — ENVARSUS XR 1 MG TABLET,EXTENDED RELEASE: ORAL | 30 days supply | Qty: 60 | Fill #8

## 2022-05-21 MED FILL — ENVARSUS XR 4 MG TABLET,EXTENDED RELEASE: ORAL | 30 days supply | Qty: 120 | Fill #7

## 2022-06-16 NOTE — Unmapped (Signed)
Russell Wade , Russell Wade , Russell Wade, Russell Wade, Russell Wade.     Was a Nurse, learning disability used for this call? No    Completed refill call assessment today to schedule Wade's medication shipment from the [facility] [phone]).  All relevant notes have been reviewed.     Specialty medication(s) Russell dose(s) confirmed: Regimen is correct Russell unchanged.   Changes to medications: Raphiel reports no changes at this time.  Changes to insurance: No  New side effects reported not previously addressed with a pharmacist or physician: None reported  Questions for the pharmacist: No    Confirmed Wade received a Conservation officer, historic buildings Russell a Surveyor, mining with first shipment. The Wade will receive a drug information handout for each medication shipped Russell additional FDA Medication Guides as required.       DISEASE/MEDICATION-SPECIFIC INFORMATION        N/A    SPECIALTY MEDICATION ADHERENCE     Medication Adherence    Wade reported X missed doses in the last month: 0  Specialty Medication: Russell XR 1 mg Tb24 extended release tablet (tacrolimus)  Wade is on additional specialty medications: Yes  Additional Specialty Medications: Russell XR 4 mg Tb24 extended release tablet (tacrolimus)  Wade Reported Additional Medication X Missed Doses in the Last Month: 0  Wade is on more than two specialty medications: Yes  Specialty Medication: mycophenolate 180 MG EC tablet (MYFORTIC)  Wade Reported Additional Medication X Missed Doses in the Last Month: 0                                Were doses missed due to medication being on hold? No    Russell 1 mg: 0 days of medicine on hand   Russell 4 mg: 0 days of medicine on hand   Myfortic 180 mg: 7 days of medicine on hand       REFERRAL TO PHARMACIST     Referral to the pharmacist: Not needed      [facility]     Shipping address confirmed in Epic.     Delivery Scheduled: Yes, Expected medication delivery date: 06/18/22.     Medication will be delivered via UPS to the prescription address in Epic WAM.    Ernestine Mcmurray   [facility] Specialty Technician

## 2022-06-18 MED FILL — MYCOPHENOLATE SODIUM 180 MG TABLET,DELAYED RELEASE: ORAL | 30 days supply | Qty: 120 | Fill #3

## 2022-06-18 MED FILL — ENVARSUS XR 4 MG TABLET,EXTENDED RELEASE: ORAL | 30 days supply | Qty: 120 | Fill #8

## 2022-06-18 MED FILL — AMLODIPINE 5 MG TABLET: ORAL | 30 days supply | Qty: 60 | Fill #3

## 2022-06-18 MED FILL — ENVARSUS XR 1 MG TABLET,EXTENDED RELEASE: ORAL | 30 days supply | Qty: 60 | Fill #9

## 2022-07-06 DIAGNOSIS — Z940 Kidney transplant status: Principal | ICD-10-CM

## 2022-07-18 DIAGNOSIS — Z940 Kidney transplant status: Principal | ICD-10-CM

## 2022-07-18 DIAGNOSIS — Z79899 Other long term (current) drug therapy: Principal | ICD-10-CM

## 2022-07-18 DIAGNOSIS — E119 Type 2 diabetes mellitus without complications: Principal | ICD-10-CM

## 2022-07-22 MED ORDER — AMLODIPINE 5 MG TABLET
ORAL_TABLET | Freq: Every day | ORAL | 11 refills | 30 days
Start: 2022-07-22 — End: 2023-07-22

## 2022-07-22 NOTE — Unmapped (Signed)
[facility] Refill Coordination Note    Specialty Medication(s) to be Shipped:   Transplant: Envarsus 1mg , Envarsus 4mg , and  mycophenolic acid 180mg     Other medication(s) to be shipped:  apap, gabapentin, carvedilol, amlodipine     Russell Wade, DOB: 07/27/1985  Phone: [phone] (home)       All above HIPAA information was verified with patient.     Was a Nurse, learning disability used for this call? No    Completed refill call assessment today to schedule patient's medication shipment from the [facility] [phone]).  All relevant notes have been reviewed.     Specialty medication(s) and dose(s) confirmed: Regimen is correct and unchanged.   Changes to medications: Russell Wade reports no changes at this time.  Changes to insurance: No  New side effects reported not previously addressed with a pharmacist or physician: None reported  Questions for the pharmacist: No    Confirmed patient received a Conservation officer, historic buildings and a Surveyor, mining with first shipment. The patient will receive a drug information handout for each medication shipped and additional FDA Medication Guides as required.       DISEASE/MEDICATION-SPECIFIC INFORMATION        N/A    SPECIALTY MEDICATION ADHERENCE     Medication Adherence    Patient reported X missed doses in the last month: 0  Specialty Medication: ENVARSUS XR 1 mg Tb24 extended release tablet (tacrolimus)  Patient is on additional specialty medications: Yes  Additional Specialty Medications: ENVARSUS XR 4 mg Tb24 extended release tablet (tacrolimus)  Patient Reported Additional Medication X Missed Doses in the Last Month: 0  Patient is on more than two specialty medications: Yes  Specialty Medication: mycophenolate 180 MG EC tablet (MYFORTIC)  Patient Reported Additional Medication X Missed Doses in the Last Month: 0                                Were doses missed due to medication being on hold? No    Envarsus Xr 1 mg: 5 days of medicine on hand   Envarsus Xr 4 mg: 5 days of medicine on hand   Mycophenolate 180 mg: 5 days of medicine on hand     REFERRAL TO PHARMACIST     Referral to the pharmacist: Not needed      [facility]     Shipping address confirmed in Epic.     Delivery Scheduled: Yes, Expected medication delivery date: 07/24/22.     Medication will be delivered via UPS to the prescription address in Epic WAM.    Tera Helper, [facility]   [facility] Specialty Pharmacist

## 2022-07-23 MED ORDER — AMLODIPINE 5 MG TABLET
ORAL_TABLET | Freq: Every day | ORAL | 11 refills | 30 days | Status: CP
Start: 2022-07-23 — End: 2023-07-23
  Filled 2022-07-23: qty 60, 30d supply, fill #0

## 2022-07-23 MED FILL — CARVEDILOL 6.25 MG TABLET: ORAL | 90 days supply | Qty: 180 | Fill #2

## 2022-07-23 MED FILL — GABAPENTIN 100 MG CAPSULE: ORAL | 90 days supply | Qty: 270 | Fill #3

## 2022-07-23 MED FILL — ACETAMINOPHEN 500 MG TABLET: ORAL | 17 days supply | Qty: 100 | Fill #1

## 2022-07-23 MED FILL — ENVARSUS XR 1 MG TABLET,EXTENDED RELEASE: ORAL | 30 days supply | Qty: 60 | Fill #10

## 2022-07-23 MED FILL — MYCOPHENOLATE SODIUM 180 MG TABLET,DELAYED RELEASE: ORAL | 30 days supply | Qty: 120 | Fill #4

## 2022-07-23 MED FILL — ENVARSUS XR 4 MG TABLET,EXTENDED RELEASE: ORAL | 30 days supply | Qty: 120 | Fill #9

## 2022-07-23 NOTE — Unmapped (Signed)
The patient is requesting a medication refill

## 2022-07-24 ENCOUNTER — Ambulatory Visit: Admit: 2022-07-24 | Payer: MEDICAID | Attending: Nephrology | Primary: Nephrology

## 2022-07-29 DIAGNOSIS — H9190 Unspecified hearing loss, unspecified ear: Principal | ICD-10-CM

## 2022-07-29 DIAGNOSIS — Z940 Kidney transplant status: Principal | ICD-10-CM

## 2022-08-11 ENCOUNTER — Ambulatory Visit: Admit: 2022-08-11 | Discharge: 2022-08-11 | Payer: MEDICAID

## 2022-08-12 ENCOUNTER — Ambulatory Visit: Admit: 2022-08-12 | Payer: MEDICAID | Attending: Audiologist | Primary: Audiologist

## 2022-08-12 NOTE — Unmapped (Signed)
[name] Clinical Assessment & Refill Coordination Note    Russell Wade, DOB: Oct 15, 1985  Phone: [phone] (home)     All above HIPAA information was verified with patient.     Was a Nurse, learning disability used for this call? 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 Max dose 6 tablets (3000 mg) per day 100 tablet 11    amlodipine (NORVASC) 5 MG tablet Take 2 tablets (10 mg total) by mouth daily. 60 tablet 11    carvedilol (COREG) 6.25 MG tablet TAKE 1 TABLET (6.25 MG TOTAL) BY MOUTH TWO (2) TIMES A DAY. 180 tablet 3    cinacalcet (SENSIPAR) 30 MG tablet Take 1 tablet (30 mg total) by mouth daily. (Patient not taking: Reported on 01/03/2022) 30 tablet 11    gabapentin (NEURONTIN) 100 MG capsule Take 1 capsule (100 mg total) by mouth Three (3) times a day as needed. 270 capsule 3    mycophenolate (MYFORTIC) 180 MG EC tablet Take 2 tablets (360 mg total) by mouth Two (2) times a day. 120 tablet 11    tacrolimus (ENVARSUS XR) 1 mg Tb24 extended release tablet Take 2 tablets (2 mg total) by mouth daily with 4 (4 mg) tablets for a total dose of 18 mg daily 60 tablet 11    tacrolimus (ENVARSUS XR) 4 mg Tb24 extended release tablet Take 4 tablets (16 mg total) by mouth in the morning. 120 tablet 11    zolpidem (AMBIEN) 10 mg tablet Take 10 mg by mouth nightly as needed for sleep. (Patient not taking: Reported on 01/03/2022)       No current facility-administered medications for this visit.        Changes to medications: Delmonte reports no changes at this time.    Allergies   Allergen Reactions    Tuberculin Ppd      Other reaction(s): Skin Rash       Changes to allergies: No    SPECIALTY MEDICATION ADHERENCE Envarsus 1mg   : 10 days of medicine on hand   Envarsus 4mg   : 10 days of medicine on hand   Mycophenolate 180mg   : 10 days of medicine on hand       Medication Adherence    Patient reported X missed doses in the last month: 0  Specialty Medication: envarsus 1mg   Patient is on additional specialty medications: Yes  Additional Specialty Medications: Envarsus 4mg   Patient Reported Additional Medication X Missed Doses in the Last Month: 0  Patient is on more than two specialty medications: Yes  Specialty Medication: mycophenolate 180mg   Patient Reported Additional Medication X Missed Doses in the Last Month: 0          Specialty medication(s) dose(s) confirmed: Regimen is correct and unchanged.     Are there any concerns with adherence? No    Adherence counseling provided? Not needed    CLINICAL MANAGEMENT AND INTERVENTION      Clinical Benefit Assessment:    Do you feel the medicine is effective or helping your condition? Yes    Clinical Benefit counseling provided? Not needed    Adverse Effects Assessment:    Are you experiencing any side effects? No  Are you experiencing difficulty administering your medicine? 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 For example, brushing your teeth or getting up in the morning. 0    Have you discussed this with your provider? Not needed    Acute Infection Status:    Acute infections noted within Epic:  No active infections  Patient reported infection: None    Therapy Appropriateness:    Is therapy appropriate and patient progressing towards therapeutic goals? Yes, therapy is appropriate and should be continued    DISEASE/MEDICATION-SPECIFIC INFORMATION      N/A    Solid Organ Transplant: Not Applicable    PATIENT SPECIFIC NEEDS     Does the patient have any physical, cognitive, or cultural barriers? No    Is the patient high risk? Yes, patient is taking a REMS drug. Medication is dispensed in compliance with REMS program    Did the patient require a clinical intervention? No    Does the patient require physician intervention or other additional services (i.e., nutrition, smoking cessation, social work)? No    SOCIAL DETERMINANTS OF HEALTH     At the [facility], we have learned that life circumstances - like trouble affording food, housing, utilities, or transportation can affect the health of many of our patients.   That is why we wanted to ask: are you currently experiencing any life circumstances that are negatively impacting your health and/or quality of life? No    Social Determinants of Health     Financial Resource Strain: Medium Risk (03/26/2021)    Overall Financial Resource Strain (CARDIA)     Difficulty of Paying Living Expenses: Somewhat hard   Internet Connectivity: Not on file   Food Insecurity: No Food Insecurity (03/26/2021)    Hunger Vital Sign     Worried About Running Out of Food in the Last Year: Never true     Ran Out of Food in the Last Year: Never true   Tobacco Use: High Risk (01/03/2022)    Patient History     Smoking Tobacco Use: Former     Smokeless Tobacco Use: Current     Passive Exposure: Not on file   Housing/Utilities: Low Risk  (03/26/2021)    Housing/Utilities     Within the past 12 months, have you ever stayed: outside, in a car, in a tent, in an overnight shelter, or temporarily in someone else's home (i.e. couch-surfing)?: No     Are you worried about losing your housing?: No     Within the past 12 months, have you been unable to get utilities (heat, electricity) when it was really needed?: No   Alcohol Use: Not At Risk (03/26/2021)    Alcohol Use     How often do you have a drink containing alcohol?: 2 - 4 times per month     How many drinks containing alcohol do you have on a typical day when you are drinking?: 3 - 4     How often do you have 5 or more drinks on one occasion?: Never   Transportation Needs: No Transportation Needs (03/26/2021)    PRAPARE - Therapist, art (Medical): No     Lack of Transportation (Non-Medical): No  Substance Use: Low Risk  (03/26/2021)    Substance Use     Taken prescription drugs for non-medical reasons: Never     Taken illegal drugs: Never     Patient indicated they have taken drugs in the past year for non-medical reasons: Yes, [positive answer(s)]: Not on file   Health Literacy: Low Risk  (03/26/2021)    Health Literacy     : Never   Physical Activity: Inactive (03/26/2021)    Exercise Vital Sign     Days of Exercise per Week: 0 days     Minutes of Exercise per Session: 0 min   Interpersonal Safety: Not on file   Stress: Stress Concern Present (03/26/2021)    [employer] of Occupational Health - Occupational Stress Questionnaire     Feeling of Stress : Rather much   Intimate Partner Violence: Not At Risk (03/26/2021)    Humiliation, Afraid, Rape, and Kick questionnaire     Fear of Current or Ex-Partner: No     Emotionally Abused: No     Physically Abused: No     Sexually Abused: No   Depression: Not at risk (04/04/2021)    PHQ-2     PHQ-2 Score: 0   Social Connections: Socially Isolated (03/26/2021)    Social Connection and Isolation Panel [NHANES]     Frequency of Communication with Friends and Family: More than three times a week     Frequency of Social Gatherings with Friends and Family: More than three times a week     Attends Religious Services: Never     Database administrator or Organizations: No     Attends Banker Meetings: Never     Marital Status: Separated       Would you be willing to receive help with any of the needs that you have identified today? Not applicable       SHIPPING     Specialty Medication(s) to be Shipped:   Transplant: Envarsus 1mg , Envarsus 4mg , and  mycophenolic acid 180mg     Other medication(s) to be shipped:  tylenol, norvasc     Changes to insurance: No    Patient was informed of new phone menu: Yes    Delivery Scheduled: Yes, Expected medication delivery date: 08/20/2022.     Medication will be delivered via UPS to the confirmed prescription address in [facility].    The patient will receive a drug information handout for each medication shipped and additional FDA Medication Guides as required.  Verified that patient has previously received a Conservation officer, historic buildings and a Surveyor, mining.    The patient or caregiver noted above participated in the development of this care plan and knows that they can request review of or adjustments to the care plan at any time.      All of the patient's questions and concerns have been addressed.    [name], PharmD   [facility] Specialty Pharmacist

## 2022-08-19 MED FILL — ENVARSUS XR 4 MG TABLET,EXTENDED RELEASE: ORAL | 30 days supply | Qty: 120 | Fill #10

## 2022-08-19 MED FILL — ACETAMINOPHEN 500 MG TABLET: ORAL | 17 days supply | Qty: 100 | Fill #2

## 2022-08-19 MED FILL — MYCOPHENOLATE SODIUM 180 MG TABLET,DELAYED RELEASE: ORAL | 30 days supply | Qty: 120 | Fill #5

## 2022-08-19 MED FILL — ENVARSUS XR 1 MG TABLET,EXTENDED RELEASE: ORAL | 30 days supply | Qty: 60 | Fill #11

## 2022-08-19 MED FILL — AMLODIPINE 5 MG TABLET: ORAL | 30 days supply | Qty: 60 | Fill #1

## 2022-08-25 DIAGNOSIS — Z940 Kidney transplant status: Principal | ICD-10-CM

## 2022-09-05 ENCOUNTER — Ambulatory Visit: Admit: 2022-09-05 | Discharge: 2022-09-05 | Payer: MEDICAID | Attending: Nephrology | Primary: Nephrology

## 2022-09-05 ENCOUNTER — Ambulatory Visit: Admit: 2022-09-05 | Discharge: 2022-09-05 | Payer: MEDICAID

## 2022-09-05 DIAGNOSIS — Z940 Kidney transplant status: Principal | ICD-10-CM

## 2022-09-05 DIAGNOSIS — E119 Type 2 diabetes mellitus without complications: Principal | ICD-10-CM

## 2022-09-05 DIAGNOSIS — Z79899 Other long term (current) drug therapy: Principal | ICD-10-CM

## 2022-09-05 LAB — URINALYSIS WITH MICROSCOPY
BACTERIA: NONE SEEN /HPF
BILIRUBIN UA: NEGATIVE
BLOOD UA: NEGATIVE
GLUCOSE UA: NEGATIVE
HYALINE CASTS: 7 /LPF — ABNORMAL HIGH (ref 0–1)
KETONES UA: NEGATIVE
LEUKOCYTE ESTERASE UA: NEGATIVE
NITRITE UA: NEGATIVE
PH UA: 6 (ref 5.0–9.0)
PROTEIN UA: NEGATIVE
RBC UA: 1 /HPF (ref <3)
SPECIFIC GRAVITY UA: 1.025 (ref 1.005–1.030)
SQUAMOUS EPITHELIAL: 1 /HPF (ref 0–5)
UROBILINOGEN UA: 0.2
WBC UA: 2 /HPF — ABNORMAL HIGH (ref <2)

## 2022-09-05 LAB — PROTEIN / CREATININE RATIO, URINE
CREATININE, URINE: 290 mg/dL
PROTEIN URINE: 32.8 mg/dL
PROTEIN/CREAT RATIO, URINE: 0.113
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
## 2022-09-08 ENCOUNTER — Other Ambulatory Visit (HOSPITAL_COMMUNITY)
Admission: RE | Admit: 2022-09-08 | Discharge: 2022-09-08 | Disposition: A | Discharge status: Home / Self Care | Payer: Medicaid Other | Source: Ambulatory Visit | Attending: Nephrology | Admitting: Nephrology

## 2022-09-08 ENCOUNTER — Encounter (HOSPITAL_COMMUNITY): Payer: Self-pay | Admitting: *Deleted

## 2022-09-08 DIAGNOSIS — Z940 Kidney transplant status: Secondary | ICD-10-CM | POA: Insufficient documentation

## 2022-09-08 DIAGNOSIS — Z789 Other specified health status: Secondary | ICD-10-CM | POA: Diagnosis not present

## 2022-09-08 DIAGNOSIS — B259 Cytomegaloviral disease, unspecified: Secondary | ICD-10-CM | POA: Insufficient documentation

## 2022-09-08 DIAGNOSIS — D899 Disorder involving the immune mechanism, unspecified: Secondary | ICD-10-CM | POA: Diagnosis not present

## 2022-09-08 DIAGNOSIS — E559 Vitamin D deficiency, unspecified: Secondary | ICD-10-CM | POA: Insufficient documentation

## 2022-09-08 DIAGNOSIS — E1129 Type 2 diabetes mellitus with other diabetic kidney complication: Secondary | ICD-10-CM | POA: Insufficient documentation

## 2022-09-08 DIAGNOSIS — N390 Urinary tract infection, site not specified: Secondary | ICD-10-CM | POA: Insufficient documentation

## 2022-09-08 DIAGNOSIS — D631 Anemia in chronic kidney disease: Secondary | ICD-10-CM | POA: Insufficient documentation

## 2022-09-08 DIAGNOSIS — Z79899 Other long term (current) drug therapy: Secondary | ICD-10-CM | POA: Diagnosis not present

## 2022-09-08 DIAGNOSIS — Z114 Encounter for screening for human immunodeficiency virus [HIV]: Secondary | ICD-10-CM | POA: Diagnosis not present

## 2022-09-08 LAB — CBC W/ DIFFERENTIAL
BASOPHILS ABSOLUTE COUNT: 0 10*3/uL (ref 0.0–0.1)
BASOPHILS RELATIVE PERCENT: 1 %
EOSINOPHILS ABSOLUTE COUNT: 0.1 10*3/uL (ref 0.0–0.5)
EOSINOPHILS RELATIVE PERCENT: 3 %
HEMATOCRIT: 37.6 — ABNORMAL LOW (ref 39.0–52.0)
HEMOGLOBIN: 12.6 g/dL — ABNORMAL LOW (ref 13.0–17.0)
IMMATURE CELLS: 0
LYMPHOCYTES ABSOLUTE COUNT: 1.5 10*3/uL (ref 0.7–4.0)
LYMPHOCYTES RELATIVE PERCENT: 38 %
MEAN CORPUSCULAR HEMOGLOBIN CONC: 33.5 g/dL (ref 30.0–36.0)
MEAN CORPUSCULAR HEMOGLOBIN: 27.7 pg (ref 26.0–34.0)
MEAN CORPUSCULAR VOLUME: 82.6 fL (ref 80.0–100.0)
MONOCYTES ABSOLUTE COUNT: 0.5 10*3/uL (ref 0.1–1.0)
MONOCYTES RELATIVE PERCENT: 12 %
NEUTROPHILS RELATIVE PERCENT: 46 %
NUCLEATED RED BLOOD CELLS: 0 (ref 0.0–0.2)
PLATELET COUNT: 190 10*3/uL (ref 150–400)
RED BLOOD CELL COUNT: 4.55 MIL/uL (ref 4.22–5.81)
RED CELL DISTRIBUTION WIDTH: 16.1 % — ABNORMAL HIGH (ref 11.5–15.5)
WHITE BLOOD CELL COUNT: 3.9 10*3/uL — ABNORMAL LOW (ref 4.0–10.5)

## 2022-09-08 LAB — VITAMIN D 25 HYDROXY: VITAMIN D, TOTAL (25OH): 7.97 ng/mL — ABNORMAL LOW (ref 30–100)

## 2022-09-08 LAB — BASIC METABOLIC PANEL
Anion gap: 10 (ref 5–15)
BUN: 21 mg/dL — ABNORMAL HIGH (ref 6–20)
CO2: 22 mmol/L (ref 22–32)
Calcium: 9.8 mg/dL (ref 8.9–10.3)
Chloride: 104 mmol/L (ref 98–111)
Creatinine, Ser: 2.27 mg/dL — ABNORMAL HIGH (ref 0.61–1.24)
GFR, Estimated: 37 mL/min — ABNORMAL LOW (ref >60)
Glucose, Bld: 113 mg/dL — ABNORMAL HIGH (ref 70–99)
Potassium: 4.3 mmol/L (ref 3.5–5.1)
Sodium: 136 mmol/L (ref 135–145)

## 2022-09-08 LAB — GAMMA GT: GGT: 23 U/L (ref 7–50)

## 2022-09-08 LAB — VITAMIN D 25 HYDROXY (VIT D DEFICIENCY, FRACTURES): Vit D, 25-Hydroxy: 7.97 ng/mL — ABNORMAL LOW (ref 30–100)

## 2022-09-08 LAB — CBC WITH DIFFERENTIAL/PLATELET
Abs Immature Granulocytes: 0.01 10*3/uL (ref 0.00–0.07)
Basophils Absolute: 0 10*3/uL (ref 0.0–0.1)
Basophils Relative: 1 %
Eosinophils Absolute: 0.1 10*3/uL (ref 0.0–0.5)
Eosinophils Relative: 3 %
HCT: 37.6 % — ABNORMAL LOW (ref 39.0–52.0)
Hemoglobin: 12.6 g/dL — ABNORMAL LOW (ref 13.0–17.0)
Immature Granulocytes: 0 %
Lymphocytes Relative: 38 %
Lymphs Abs: 1.5 10*3/uL (ref 0.7–4.0)
MCH: 27.7 pg (ref 26.0–34.0)
MCHC: 33.5 g/dL (ref 30.0–36.0)
MCV: 82.6 fL (ref 80.0–100.0)
Monocytes Absolute: 0.5 10*3/uL (ref 0.1–1.0)
Monocytes Relative: 12 %
Neutro Abs: 1.8 10*3/uL (ref 1.7–7.7)
Neutrophils Relative %: 46 %
Platelets: 190 10*3/uL (ref 150–400)
RBC: 4.55 MIL/uL (ref 4.22–5.81)
RDW: 16.1 % — ABNORMAL HIGH (ref 11.5–15.5)
WBC: 3.9 10*3/uL — ABNORMAL LOW (ref 4.0–10.5)
nRBC: 0 % (ref 0.0–0.2)

## 2022-09-08 LAB — LIPID PANEL
Cholesterol: 160 mg/dL (ref 0–200)
HDL: 24 mg/dL — ABNORMAL LOW (ref >40)
LDL Cholesterol: 99 mg/dL (ref 0–99)
Total CHOL/HDL Ratio: 6.7 RATIO
Triglycerides: 183 mg/dL — ABNORMAL HIGH (ref <150)
VLDL: 37 mg/dL (ref 0–40)

## 2022-09-08 LAB — ALKALINE PHOSPHATASE: Alkaline Phosphatase: 132 U/L — ABNORMAL HIGH (ref 38–126)

## 2022-09-08 LAB — AST: AST: 16 U/L (ref 15–41)

## 2022-09-08 LAB — MAGNESIUM: Magnesium: 1.6 mg/dL — ABNORMAL LOW (ref 1.7–2.4)

## 2022-09-08 LAB — ALT: ALT: 12 U/L (ref 0–44)

## 2022-09-08 LAB — ALBUMIN: Albumin: 4.5 g/dL (ref 3.5–5.0)

## 2022-09-09 DIAGNOSIS — D849 Immunodeficiency, unspecified: Principal | ICD-10-CM

## 2022-09-09 DIAGNOSIS — Z940 Kidney transplant status: Principal | ICD-10-CM

## 2022-09-09 LAB — HEMOGLOBIN A1C
Hgb A1c MFr Bld: 6 % — ABNORMAL HIGH (ref 4.8–5.6)
Mean Plasma Glucose: 126 mg/dL

## 2022-09-09 MED ORDER — TACROLIMUS XR 1 MG TABLET,EXTENDED RELEASE 24 HR
ORAL_TABLET | Freq: Every day | ORAL | 11 refills | 30 days | Status: CP
Start: 2022-09-09 — End: ?
  Filled 2022-09-16: qty 60, 30d supply, fill #0

## 2022-09-09 NOTE — Unmapped (Signed)
[facility] Refill Coordination Note    Specialty Medication(s) to be Shipped:   Transplant: Envarsus 1mg , Envarsus 4mg , and  mycophenolic acid 180mg     Other medication(s) to be shipped:   amlodipine     Russell Wade, DOB: 31-May-1986  Phone: [phone] (home)       All above HIPAA information was verified with patient.     Was a Nurse, learning disability used for this call? No    Completed refill call assessment today to schedule patient's medication shipment from the [facility] [phone]).  All relevant notes have been reviewed.     Specialty medication(s) and dose(s) confirmed: Regimen is correct and unchanged.   Changes to medications: Russell Wade reports no changes at this time.  Changes to insurance: No  New side effects reported not previously addressed with a pharmacist or physician: None reported  Questions for the pharmacist: No    Confirmed patient received a Conservation officer, historic buildings and a Surveyor, mining with first shipment. The patient will receive a drug information handout for each medication shipped and additional FDA Medication Guides as required.       DISEASE/MEDICATION-SPECIFIC INFORMATION        N/A    SPECIALTY MEDICATION ADHERENCE     Medication Adherence    Patient reported X missed doses in the last month: 1  Specialty Medication: ENVARSUS XR 1 mg Tb24 extended release tablet (tacrolimus)  Patient is on additional specialty medications: Yes  Additional Specialty Medications: ENVARSUS XR 4 mg Tb24 extended release tablet (tacrolimus)  Patient Reported Additional Medication X Missed Doses in the Last Month: 1  Patient is on more than two specialty medications: Yes  Specialty Medication: mycophenolate 180 MG EC tablet (MYFORTIC)  Patient Reported Additional Medication X Missed Doses in the Last Month: 1              Were doses missed due to medication being on hold? No    Envarsus Xr 1 mg: 11 days of medicine on hand   Envarsus Xr 4 mg: 11 days of medicine on hand   Mycophenolate 180 mg: 11 days of medicine on hand     REFERRAL TO PHARMACIST     Referral to the pharmacist: Not needed      SHIPPING     Shipping address confirmed in Epic.     Delivery Scheduled: Yes, Expected medication delivery date: 09/17/2022.  However, Rx request for refills was sent to the provider as there are none remaining.     Medication will be delivered via UPS to the prescription address in Epic [facility].    Alwyn Pea   [facility] Specialty Pharmacist

## 2022-09-09 NOTE — Unmapped (Signed)
The patient is requesting a medication refill

## 2022-09-10 LAB — BK QUANT PCR (PLASMA/SERUM): BK Quantitaion PCR: NEGATIVE IU/mL

## 2022-09-10 LAB — CMV DNA, QUANTITATIVE, PCR
CMV DNA Quant: NEGATIVE IU/mL
Log10 CMV Qn DNA Pl: UNDETERMINED log10 IU/mL

## 2022-09-10 LAB — TACROLIMUS LEVEL: Tacrolimus (FK506) - LabCorp: 11.8 ng/mL (ref 2.0–20.0)

## 2022-09-11 LAB — COMPREHENSIVE METABOLIC PANEL
ALBUMIN: 4.5 (ref 3.5–5.0)
ALKALINE PHOSPHATASE: 132 U/L — ABNORMAL HIGH (ref 38–126)
ALT (SGPT): 12 U/L (ref 0–44)
ANION GAP: 10 (ref 5–15)
AST (SGOT): 16 U/L (ref 15–41)
BLOOD UREA NITROGEN: 21 mg/dL — ABNORMAL HIGH (ref 6–20)
CALCIUM: 9.8 mg/dL (ref 8.9–10.3)
CHLORIDE: 104 mmol/L (ref 98–111)
CO2: 22 mmol/L (ref 22–32)
CREATININE: 2.27 mg/dL — ABNORMAL HIGH (ref 0.61–1.24)
EGFR CKD-EPI AA MALE: 37 mL/min — ABNORMAL LOW
GLUCOSE RANDOM: 113 mg/dL — ABNORMAL HIGH (ref 70–99)
POTASSIUM: 4.3 mmol/L (ref 3.5–5.1)
SODIUM: 136 mmol/L (ref 135–145)

## 2022-09-11 LAB — LIPID PANEL
CHOLESTEROL: 160 mg/dL (ref 0–200)
HDL CHOLESTEROL: 24 mg/dL — ABNORMAL LOW
LDL CHOLESTEROL CALCULATED: 99 mg/dL (ref 0–99)
TRIGLYCERIDES: 183 mg/dL — ABNORMAL HIGH
VLDL CHOLESTEROL CAL: 37 mg/dL (ref 0–40)

## 2022-09-11 LAB — TACROLIMUS LEVEL, TROUGH: TACROLIMUS, TROUGH: 11.8 ng/mL (ref 2.0–20.0)

## 2022-09-11 LAB — HEMOGLOBIN A1C
ESTIMATED AVERAGE GLUCOSE: 126
HEMOGLOBIN A1C: 6 — ABNORMAL HIGH

## 2022-09-11 LAB — GAMMA GT: GAMMA GLUTAMYL TRANSFERASE: 23 U/L (ref 7–50)

## 2022-09-11 LAB — CMV DNA, QUANTITATIVE, PCR: CMV QUANT: NEGATIVE

## 2022-09-11 LAB — MAGNESIUM: MAGNESIUM: 1.6 — ABNORMAL LOW

## 2022-09-11 LAB — BK VIRUS QUANTITATIVE PCR, BLOOD: BK BLOOD RESULT: NEGATIVE [IU]/mL

## 2022-09-16 MED FILL — AMLODIPINE 5 MG TABLET: ORAL | 30 days supply | Qty: 60 | Fill #2

## 2022-09-16 MED FILL — ENVARSUS XR 4 MG TABLET,EXTENDED RELEASE: ORAL | 30 days supply | Qty: 120 | Fill #11

## 2022-09-16 MED FILL — MYCOPHENOLATE SODIUM 180 MG TABLET,DELAYED RELEASE: ORAL | 30 days supply | Qty: 120 | Fill #6

## 2022-09-18 LAB — VITAMIN D 1,25 DIHYDROXY
Vitamin D 1, 25 (OH)2 Total: 50 pg/mL
Vitamin D2 1, 25 (OH)2: <10 pg/mL
Vitamin D3 1, 25 (OH)2: 45 pg/mL

## 2022-09-25 DIAGNOSIS — Z940 Kidney transplant status: Principal | ICD-10-CM

## 2022-09-25 DIAGNOSIS — D849 Immunodeficiency, unspecified: Principal | ICD-10-CM

## 2022-09-25 MED ORDER — TACROLIMUS XR 1 MG TABLET,EXTENDED RELEASE 24 HR
ORAL_TABLET | Freq: Every day | ORAL | 11 refills | 30 days | Status: CP
Start: 2022-09-25 — End: ?

## 2022-09-25 MED ORDER — TACROLIMUS XR 4 MG TABLET,EXTENDED RELEASE 24 HR
ORAL_TABLET | Freq: Every day | ORAL | 3 refills | 90 days | Status: CP
Start: 2022-09-25 — End: 2023-09-25
  Filled 2022-10-13: qty 60, 30d supply, fill #0

## 2022-09-25 MED ORDER — ERGOCALCIFEROL (VITAMIN D2) 1,250 MCG (50,000 UNIT) CAPSULE
ORAL_CAPSULE | ORAL | 15 refills | 28 days | Status: CP
Start: 2022-09-25 — End: 2023-12-25
  Filled 2022-10-13: qty 4, 28d supply, fill #0

## 2022-09-25 NOTE — Unmapped (Signed)
Reviewed recent labs with Dr. Carlene Coria, pt advised to decrease envarsus to 14 mg daily if level of 11.8 is a true 24 hour level.  Pt also advised to start vitamin D supplement 50,000 units weekly for 12 weeks.  Script sent to [facility].  Attempted to leave pt a VM but mailbox was full.  Sent a [place] my chart of the above.

## 2022-09-29 NOTE — Unmapped (Addendum)
[facility] Pharmacist has reviewed a new prescription for envarsus  that indicates a dose decrease.  Patient was counseled on this dosage change by CC- see epic note from 09/25/22.via [employer]  Next refill call date adjusted if necessary.        Clinical Assessment Needed For: Dose Change  Medication: Envarsus XR 1mg  tablet  Last Fill Date/Day Supply: 09/16/2022 / 30 days  Copay $0  Was previous dose already scheduled to fill: No    Notes to Pharmacist: N/A      Clinical Assessment Needed For: Dose Change  Medication: Envarsus XR 4mg  tablet  Last Fill Date/Day Supply: 09/16/2022 / 30 days  Copay $0  Was previous dose already scheduled to fill: No    Notes to Pharmacist: Vit D2 - $4

## 2022-10-08 MED ORDER — GABAPENTIN 100 MG CAPSULE
ORAL_CAPSULE | Freq: Three times a day (TID) | ORAL | 3 refills | 90 days | Status: CP | PRN
Start: 2022-10-08 — End: 2023-10-08
  Filled 2022-10-13: qty 270, 90d supply, fill #0

## 2022-10-08 NOTE — Unmapped (Signed)
[facility] Refill Coordination Note    Specialty Medication(s) to be Shipped:   Transplant: Envarsus 1mg , Envarsus 4mg , and mycophenolate mofetil 180mg     Other medication(s) to be shipped:  tylenol , amlodipine , carvedilol drisodol and gabapentin      Russell Wade, DOB: Jan 09, 1986  Phone: [phone] (home)       All above HIPAA information was verified with patient.     Was a Nurse, learning disability used for this call? No    Completed refill call assessment today to schedule patient's medication shipment from the [facility] [phone]).  All relevant notes have been reviewed.     Specialty medication(s) and dose(s) confirmed: Regimen is correct and unchanged.   Changes to medications: Morgun reports no changes at this time.  Changes to insurance: No  New side effects reported not previously addressed with a pharmacist or physician: None reported  Questions for the pharmacist: No    Confirmed patient received a Conservation officer, historic buildings and a Surveyor, mining with first shipment. The patient will receive a drug information handout for each medication shipped and additional FDA Medication Guides as required.       DISEASE/MEDICATION-SPECIFIC INFORMATION        N/A    SPECIALTY MEDICATION ADHERENCE     Medication Adherence    Patient reported X missed doses in the last month: 0  Specialty Medication: ENVARSUS XR 4 mg Tb24 extended release tablet (tacrolimus)  Patient is on additional specialty medications: Yes  Additional Specialty Medications: ENVARSUS XR 1 mg Tb24 extended release tablet (tacrolimus)  Patient Reported Additional Medication X Missed Doses in the Last Month: 0  Patient is on more than two specialty medications: Yes  Specialty Medication: mycophenolate 180 MG EC tablet (MYFORTIC)  Patient Reported Additional Medication X Missed Doses in the Last Month: 0              Were doses missed due to medication being on hold? No    Envarsus 1 mg: 10 days of medicine on hand   Envarsus 4 mg: 10 days of medicine on hand   mycophenolate 180 mg: 10 days of medicine on hand       REFERRAL TO PHARMACIST     Referral to the pharmacist: Not needed      [facility]     Shipping address confirmed in Epic.       Delivery Scheduled: Yes, Expected medication delivery date: 10/14/22.     Medication will be delivered via UPS to the prescription address in Epic [place].    Russell Wade   [facility] Specialty Technician

## 2022-10-13 MED FILL — ENVARSUS XR 4 MG TABLET,EXTENDED RELEASE: ORAL | 90 days supply | Qty: 270 | Fill #0

## 2022-10-13 MED FILL — CARVEDILOL 6.25 MG TABLET: ORAL | 90 days supply | Qty: 180 | Fill #3

## 2022-10-13 MED FILL — MYCOPHENOLATE SODIUM 180 MG TABLET,DELAYED RELEASE: ORAL | 30 days supply | Qty: 120 | Fill #7

## 2022-10-13 MED FILL — AMLODIPINE 5 MG TABLET: ORAL | 30 days supply | Qty: 60 | Fill #3

## 2022-10-13 MED FILL — ACETAMINOPHEN 500 MG TABLET: ORAL | 17 days supply | Qty: 100 | Fill #3

## 2022-11-04 NOTE — Unmapped (Signed)
[facility] Refill Coordination Note    Specialty Medication(s) to be Shipped:   Transplant: Envarsus 1mg  and mycophenolate mofetil 180mg     Other medication(s) to be shipped:  drisdol and amlodipine      Russell Wade, DOB: 1985/10/12  Phone: [phone] (home)       All above HIPAA information was verified with patient.     Was a Nurse, learning disability used for this call? No    Completed refill call assessment today to schedule patient's medication shipment from the [facility] [phone]).  All relevant notes have been reviewed.     Specialty medication(s) and dose(s) confirmed: Regimen is correct and unchanged.   Changes to medications: Cobin reports no changes at this time.  Changes to insurance: No  New side effects reported not previously addressed with a pharmacist or physician: None reported  Questions for the pharmacist: No    Confirmed patient received a Conservation officer, historic buildings and a Surveyor, mining with first shipment. The patient will receive a drug information handout for each medication shipped and additional FDA Medication Guides as required.       DISEASE/MEDICATION-SPECIFIC INFORMATION        N/A    SPECIALTY MEDICATION ADHERENCE     Medication Adherence    Patient reported X missed doses in the last month: 0  Specialty Medication: ENVARSUS XR 1 mg Tb24 extended release tablet (tacrolimus)  Patient is on additional specialty medications: Yes  Additional Specialty Medications: mycophenolate 180 MG EC tablet (MYFORTIC)  Patient Reported Additional Medication X Missed Doses in the Last Month: 0  Patient is on more than two specialty medications: No              Were doses missed due to medication being on hold? No    Envarsus 1 mg: 10 days of medicine on hand   mycophenolate 180 mg: 10 days of medicine on hand     REFERRAL TO PHARMACIST     Referral to the pharmacist: Not needed      [facility]     Shipping address confirmed in Epic.       Delivery Scheduled: Yes, Expected medication delivery date: 05/28/24d.     Medication will be delivered via UPS to the prescription address in Epic [place].    [name]   [facility] Specialty Technician

## 2022-11-07 MED FILL — ERGOCALCIFEROL (VITAMIN D2) 1,250 MCG (50,000 UNIT) CAPSULE: ORAL | 28 days supply | Qty: 4 | Fill #1

## 2022-11-07 MED FILL — MYCOPHENOLATE SODIUM 180 MG TABLET,DELAYED RELEASE: ORAL | 30 days supply | Qty: 120 | Fill #8

## 2022-11-07 MED FILL — AMLODIPINE 5 MG TABLET: ORAL | 30 days supply | Qty: 60 | Fill #4

## 2022-11-07 MED FILL — ENVARSUS XR 1 MG TABLET,EXTENDED RELEASE: ORAL | 30 days supply | Qty: 60 | Fill #1

## 2022-12-17 NOTE — Unmapped (Signed)
[facility] Refill Coordination Note    Specialty Medication(s) to be Shipped:   Transplant: Envarsus 1mg  and  mycophenolic acid 180mg     Other medication(s) to be shipped:  amlodipine (may call [facility] to transfer this month), acetaminophen, ergocalciferol      Russell Wade, DOB: 03-Feb-1986  Phone: [phone] (home)       All above HIPAA information was verified with patient.     Was a Nurse, learning disability used for this call? No    Completed refill call assessment today to schedule patient's medication shipment from the [facility] [phone]).  All relevant notes have been reviewed.     Specialty medication(s) and dose(s) confirmed: Regimen is correct and unchanged.   Changes to medications: [name] reports no changes at this time.  Changes to insurance: No  New side effects reported not previously addressed with a pharmacist or physician: None reported  Questions for the pharmacist: No    Confirmed patient received a Conservation officer, historic buildings and a Surveyor, mining with first shipment. The patient will receive a drug information handout for each medication shipped and additional FDA Medication Guides as required.       DISEASE/MEDICATION-SPECIFIC INFORMATION        N/A    SPECIALTY MEDICATION ADHERENCE     Medication Adherence    Patient reported X missed doses in the last month: 0  Specialty Medication: tacrolimus (ENVARSUS XR) 1 mg Tb24 extended release tablet  Patient is on additional specialty medications: Yes  Additional Specialty Medications: tacrolimus (ENVARSUS XR) 4 mg Tb24 extended release tablet  Patient Reported Additional Medication X Missed Doses in the Last Month: 0  Patient is on more than two specialty medications: Yes  Specialty Medication: mycophenolate (MYFORTIC) 180 MG EC tablet  Patient Reported Additional Medication X Missed Doses in the Last Month: 0  Informant: patient              Were doses missed due to medication being on hold? No    Mycophenolate 180 mg: 7 days of medicine on hand   Envarsus 1 mg: 7 days of medicine on hand       REFERRAL TO PHARMACIST     Referral to the pharmacist: Not needed      [facility]     Shipping address confirmed in Epic.       Delivery Scheduled: Yes, Expected medication delivery date: 12/23/22.     Medication will be delivered via UPS to the prescription address in Epic WAM.    Sherral Hammers, PharmD   [facility] Specialty Pharmacist

## 2022-12-22 MED FILL — MYCOPHENOLATE SODIUM 180 MG TABLET,DELAYED RELEASE: ORAL | 30 days supply | Qty: 120 | Fill #9

## 2022-12-22 MED FILL — ENVARSUS XR 1 MG TABLET,EXTENDED RELEASE: ORAL | 30 days supply | Qty: 60 | Fill #2

## 2022-12-22 MED FILL — ERGOCALCIFEROL (VITAMIN D2) 1,250 MCG (50,000 UNIT) CAPSULE: ORAL | 28 days supply | Qty: 4 | Fill #2

## 2022-12-22 MED FILL — ACETAMINOPHEN 500 MG TABLET: ORAL | 17 days supply | Qty: 100 | Fill #4

## 2022-12-22 MED FILL — AMLODIPINE 5 MG TABLET: ORAL | 30 days supply | Qty: 60 | Fill #5

## 2023-01-13 DIAGNOSIS — Z940 Kidney transplant status: Principal | ICD-10-CM

## 2023-01-14 MED ORDER — CARVEDILOL 6.25 MG TABLET
ORAL_TABLET | Freq: Two times a day (BID) | ORAL | 3 refills | 90 days | Status: CP
Start: 2023-01-14 — End: 2024-01-14
  Filled 2023-02-05: qty 60, 30d supply, fill #0

## 2023-01-14 NOTE — Unmapped (Signed)
[facility] Refill Coordination Note    Specialty Medication(s) to be Shipped:   Transplant: Envarsus 1mg , Envarsus 4mg , and mycophenolate mofetil 180mg     Other medication(s) to be shipped:  tylenol , amlodipine , carvedilol , drisdol , gabapentin      Russell Wade, DOB: [birth year]  Phone: [phone] (home)       All above HIPAA information was verified with patient.     Was a Nurse, learning disability used for this call? No    Completed refill call assessment today to schedule patient's medication shipment from the [facility] [phone]).  All relevant notes have been reviewed.     Specialty medication(s) and dose(s) confirmed: Regimen is correct and unchanged.   Changes to medications: Turon reports no changes at this time.  Changes to insurance: No  New side effects reported not previously addressed with a pharmacist or physician: None reported  Questions for the pharmacist: No    Confirmed patient received a Conservation officer, historic buildings and a Surveyor, mining with first shipment. The patient will receive a drug information handout for each medication shipped and additional FDA Medication Guides as required.       DISEASE/MEDICATION-SPECIFIC INFORMATION        N/A    SPECIALTY MEDICATION ADHERENCE     Medication Adherence    Patient reported X missed doses in the last month: 0  Specialty Medication: ENVARSUS XR 4 mg Tb24 extended release tablet (tacrolimus)  Patient is on additional specialty medications: Yes  Additional Specialty Medications: tacrolimus: ENVARSUS XR 1 mg Tb24 extended release tablet  Patient Reported Additional Medication X Missed Doses in the Last Month: 0  Patient is on more than two specialty medications: Yes  Specialty Medication: mycophenolate 180 MG EC tablet (MYFORTIC)  Patient Reported Additional Medication X Missed Doses in the Last Month: 0              Were doses missed due to medication being on hold? No    Envarsus 1 mg: 4 days of medicine on hand Envarsus 4 mg: 4 days of medicine on hand   mycophenolate 180 mg: 4 days of medicine on hand     REFERRAL TO PHARMACIST     Referral to the pharmacist: Not needed      [facility]     Shipping address confirmed in Epic.       Delivery Scheduled: Yes, Expected medication delivery date: 01/16/23.     Medication will be delivered via UPS to the prescription address in Epic WAM.    [name]   [facility] Specialty Technician

## 2023-01-14 NOTE — Unmapped (Signed)
Pt request for RX Refill

## 2023-01-15 NOTE — Unmapped (Signed)
Roshawn Ayala 's entire shipment will be delayed as a result of the patient's insurance being terminated.      I have reached out to the patient  at [phone] and was unable to leave a message. We will send a text message and wait for a call back from the patient to reschedule the delivery.  We have not confirmed the new delivery date.

## 2023-01-19 NOTE — Unmapped (Signed)
Russell Wade 's entire shipment will be canceled  as a result of the patient's insurance being terminated.      I have reached out to the patient  at [phone] and was unable to leave a message. We will not reschedule the medication and have removed this/these medication(s) from the work request.  We have canceled this work request.

## 2023-02-03 MED ORDER — CINACALCET 30 MG TABLET
ORAL_TABLET | Freq: Every day | ORAL | 11 refills | 30 days
Start: 2023-02-03 — End: 2024-02-03

## 2023-02-03 NOTE — Unmapped (Signed)
Lab orders

## 2023-02-03 NOTE — Unmapped (Signed)
Pt request for RX Refill

## 2023-02-03 NOTE — Unmapped (Signed)
Received a message from [facility] that patient;s medicaid is not active.  Called patient to inquire, he was told that he needs to go to DSS to re-apply for his Medicaid which he will do tomorrow.  Pt advised to call asap to PAP line to apply for temporary supply of his medications.  [facility] will look at a free trial for his envarsus.  He currently has approx 5 days of envarsus left.  He is out of his other meds.  Pt also advised to get labs asap as he has not had labs since March.  He think he has had them since but nothing in [place].  New lab orders sent to [facility].  WIll follow up Thursday to make sure pt has receive meds and that he has re-activated his Medicaid.

## 2023-02-04 MED ORDER — CINACALCET 30 MG TABLET
ORAL_TABLET | Freq: Every day | ORAL | 11 refills | 30 days | Status: CP
Start: 2023-02-04 — End: 2024-02-04
  Filled 2023-02-05: qty 30, 30d supply, fill #0

## 2023-02-04 NOTE — Unmapped (Signed)
[facility] Refill Coordination Note    Specialty Medication(s) to be Shipped:   Transplant: Envarsus 1mg , Envarsus 4mg , and mycophenolate mofetil 180 mg    Other medication(s) to be shipped:  Gabapentin,tylenol,amlodipine,carvedilol,cinacalcet,vitamin d     Russell Wade, DOB: [birth year]  Phone: [phone] (home)       All above HIPAA information was verified with patient.     Was a Nurse, learning disability used for this call? No    Completed refill call assessment today to schedule patient's medication shipment from the [facility] [phone]).  All relevant notes have been reviewed.     Specialty medication(s) and dose(s) confirmed: Regimen is correct and unchanged.   Changes to medications: Russell Wade reports no changes at this time.  Changes to insurance: No  New side effects reported not previously addressed with a pharmacist or physician: None reported  Questions for the pharmacist: No    Confirmed patient received a Conservation officer, historic buildings and a Surveyor, mining with first shipment. The patient will receive a drug information handout for each medication shipped and additional FDA Medication Guides as required.       DISEASE/MEDICATION-SPECIFIC INFORMATION        N/A    SPECIALTY MEDICATION ADHERENCE     Medication Adherence    Specialty Medication: ENVARSUS XR 4 mg Tb24 extended release tablet (tacrolimus)  Patient is on additional specialty medications: Yes  Additional Specialty Medications: ENVARSUS XR 1 mg Tb24 extended release tablet (tacrolimus)  Patient is on more than two specialty medications: Yes  Specialty Medication: mycophenolate 180 MG EC tablet (MYFORTIC)              Were doses missed due to medication being on hold? No      ENVARSUS XR 1 mg Tb24 extended release tablet (tacrolimus): 0 doses of medicine on hand     ENVARSUS XR 4 mg Tb24 extended release tablet (tacrolimus): 0 doses of medicine on hand     mycophenolate 180 MG EC tablet (MYFORTIC): 0 doses of medicine on hand       REFERRAL TO PHARMACIST     Referral to the pharmacist: Yes - routine compliance concerns. Patient has missed 1-3 doses of medication. Refills were scheduled and concern routed to pharmacist for evaluation.      SHIPPING     Shipping address confirmed in Epic.       Delivery Scheduled: Yes, Expected medication delivery date: 02/06/2023.     Medication will be delivered via UPS to the prescription address in Epic [place].    Mechell Girgis J Helane Gunther   [facility] Specialty Technician

## 2023-02-05 MED FILL — ENVARSUS XR 1 MG TABLET,EXTENDED RELEASE: ORAL | 30 days supply | Qty: 60 | Fill #3

## 2023-02-05 MED FILL — AMLODIPINE 5 MG TABLET: ORAL | 30 days supply | Qty: 60 | Fill #6

## 2023-02-05 MED FILL — ERGOCALCIFEROL (VITAMIN D2) 1,250 MCG (50,000 UNIT) CAPSULE: ORAL | 28 days supply | Qty: 4 | Fill #3

## 2023-02-05 MED FILL — ENVARSUS XR 4 MG TABLET,EXTENDED RELEASE: ORAL | 30 days supply | Qty: 90 | Fill #1

## 2023-02-05 MED FILL — GABAPENTIN 100 MG CAPSULE: ORAL | 30 days supply | Qty: 90 | Fill #1

## 2023-02-05 MED FILL — MYCOPHENOLATE SODIUM 180 MG TABLET,DELAYED RELEASE: ORAL | 30 days supply | Qty: 120 | Fill #10

## 2023-02-05 MED FILL — ACETAMINOPHEN 500 MG TABLET: ORAL | 17 days supply | Qty: 100 | Fill #5

## 2023-02-05 NOTE — Unmapped (Signed)
30d temp expires 03-07-2023 mailing pap app requested by Onalee Hua from [facility]

## 2023-02-05 NOTE — Unmapped (Signed)
Russell Wade 's entire shipment will be delayed as a result of the patient's insurance being terminated.      I have spoken with the patient  at [phone]  and communicated the delay. We will wait for a call back from the patient to reschedule the delivery.  We have not confirmed the new delivery date.

## 2023-02-23 NOTE — Unmapped (Signed)
UNOS form

## 2023-03-04 NOTE — Unmapped (Signed)
[facility] Refill Coordination Note    Specialty Medication(s) to be Shipped:   Transplant: Envarsus 1mg , Envarsus 4mg , and mycophenolate mofetil 180 mg    Other medication(s) to be shipped:  amlodipine,carvedilol,gabapentin,vitamin d     Russell Wade, DOB: 1985/11/02  Phone: [phone] (home)       All above HIPAA information was verified with patient.     Was a Nurse, learning disability used for this call? No    Completed refill call assessment today to schedule patient's medication shipment from the [facility]  [phone]).  All relevant notes have been reviewed.     Specialty medication(s) and dose(s) confirmed: Regimen is correct and unchanged.   Changes to medications: Arion reports stopping the following medications: cinacalcet   Changes to insurance: No  New side effects reported not previously addressed with a pharmacist or physician: None reported  Questions for the pharmacist: No    Confirmed patient received a Conservation officer, historic buildings and a Surveyor, mining with first shipment. The patient will receive a drug information handout for each medication shipped and additional FDA Medication Guides as required.       DISEASE/MEDICATION-SPECIFIC INFORMATION        N/A    SPECIALTY MEDICATION ADHERENCE     Medication Adherence    Patient reported X missed doses in the last month: 0  Specialty Medication: ENVARSUS XR 1 mg Tb24 extended release tablet (tacrolimus)  Patient is on additional specialty medications: Yes  Additional Specialty Medications: ENVARSUS XR 4 mg Tb24 extended release tablet (tacrolimus)  Patient Reported Additional Medication X Missed Doses in the Last Month: 0  Patient is on more than two specialty medications: Yes  Specialty Medication: mycophenolate 180 MG EC tablet (MYFORTIC)  Patient Reported Additional Medication X Missed Doses in the Last Month: 0              Were doses missed due to medication being on hold? No ENVARSUS XR 1 mg Tb24 extended release tablet (tacrolimus): 5 days of medicine on hand     ENVARSUS XR 4 mg Tb24 extended release tablet (tacrolimus): 5 days of medicine on hand    mycophenolate 180 MG EC tablet (MYFORTIC): 5 days of medicine on hand       REFERRAL TO PHARMACIST     Referral to the pharmacist: Not needed      [facility]     Shipping address confirmed in Epic.       Delivery Scheduled: Yes, Expected medication delivery date: 03/10/2023.     Medication will be delivered via UPS to the prescription address in Epic WAM.    Shuntell Foody [facility]  Specialty Technician

## 2023-03-09 NOTE — Unmapped (Signed)
30 DAY TEMP DENIED- HAS NOT BEEN 1 YEAR SINCE LAST TEMP

## 2023-03-09 NOTE — Unmapped (Signed)
Russell Wade 's entire shipment will be delayed as a result of the patient's insurance being terminated.      [name] has spoken with the patient  at [phone]  and communicated the delay. We will wait for a call back from the patient to reschedule the delivery.  We have not confirmed the new delivery date.

## 2023-03-12 NOTE — Unmapped (Signed)
Russell Wade 's entire shipment will be canceled as a result of the patient's insurance being terminated.      I have reached out to the patient  at [phone]  and left a voicemail message.  We will not reschedule the medication and have removed this/these medication(s) from the work request.  We have canceled this work request.

## 2023-03-24 NOTE — Unmapped (Signed)
PATIENT CAME TO HOP OFFICE- NMI

## 2023-03-30 DIAGNOSIS — Z940 Kidney transplant status: Principal | ICD-10-CM

## 2023-03-30 MED ORDER — MYCOPHENOLATE SODIUM 180 MG TABLET,DELAYED RELEASE
ORAL_TABLET | Freq: Two times a day (BID) | ORAL | 0 refills | 30 days | Status: CP
Start: 2023-03-30 — End: 2023-04-29

## 2023-03-30 NOTE — Unmapped (Signed)
In error

## 2023-04-10 NOTE — Unmapped (Signed)
Russell Wade has been contacted in regards to their refill of ENVARSUS XR 1 mg Tb24 extended release tablet (tacrolimus),ENVARSUS XR 4 mg Tb24 extended release tablet (tacrolimus),  mycophenolate 180 MG EC tablet (MYFORTIC).At this time, they have declined refill due to  waiting on insurance/ working with clinic to get small supplies . Refill assessment call date has been updated per the patient's request.

## 2023-05-01 NOTE — Unmapped (Signed)
Russell Wade has been contacted in regards to their refill of ENVARSUS XR 1 mg Tb24 extended release tablet (tacrolimus), ENVARSUS XR 4 mg Tb24 extended release tablet (tacrolimus), and mycophenolate 180 MG EC tablet (MYFORTIC). At this time, they have declined refill due to  waiting on insurance to be active . Refill assessment call date has been updated per the patient's request.

## 2023-06-15 NOTE — Unmapped (Signed)
Called to schedule annual visit with Dr.True. Patient requested to call him back tomorrow, he wanted to check his insurance coverage.

## 2023-06-24 NOTE — Unmapped (Signed)
1/8: patient states he was getting mycophenolate and envarsus from an outside pharmacy- has not missed any envarsus but hasn't take mycophenolate for the last month. He is unsure that pharmacy name but said they did mail the meds to him. He has not yet fixed insurance and is keeping in touch with clinic on all of this. I will message clinic as well -[facility] Clinical Assessment & Refill Coordination Note    Russell Wade, DOB: 1986-03-03  Phone: [phone] (home)     All above HIPAA information was verified with patient.     Was a Nurse, learning disability used for this call? 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 Max dose 6 tablets (3000 mg) per day 100 tablet 11    amlodipine (NORVASC) 5 MG tablet Take 2 tablets (10 mg total) by mouth daily. 60 tablet 11    carvedilol (COREG) 6.25 MG tablet TAKE 1 TABLET (6.25 MG TOTAL) BY MOUTH TWO (2) TIMES A DAY. 180 tablet 3    cinacalcet (SENSIPAR) 30 MG tablet Take 1 tablet (30 mg total) by mouth daily. 30 tablet 11    ergocalciferol-1,250 mcg, 50,000 unit, (DRISDOL) 1,250 mcg (50,000 unit) capsule Take 1 capsule (1,250 mcg total) by mouth once a week. 4 capsule 15    gabapentin (NEURONTIN) 100 MG capsule Take 1 capsule (100 mg total) by mouth Three (3) times a day as needed. 270 capsule 3    mycophenolate (MYFORTIC) 180 MG EC tablet Take 2 tablets (360 mg total) by mouth two (2) times a day. 120 tablet 0    tacrolimus (ENVARSUS XR) 1 mg Tb24 extended release tablet Take 2 tablets (2 mg total) by mouth in the morning. In addition to 4 mg tablet for a total daily dose of 14 mg. 60 tablet 11    tacrolimus (ENVARSUS XR) 4 mg Tb24 extended release tablet Take 3 tablets (12 mg total) by mouth in the morning. Take in addition to 1 mg tablets for a total daily dose of 14 mg. 270 tablet 3    zolpidem (AMBIEN) 10 mg tablet Take 10 mg by mouth nightly as needed for sleep. (Patient not taking: Reported on 01/03/2022)       No current facility-administered medications for this visit.        Changes to medications: Shafi reports no changes at this time.    Allergies   Allergen Reactions    Tuberculin Ppd      Other reaction(s): Skin Rash       Changes to allergies: No    SPECIALTY MEDICATION ADHERENCE     Envarsus 1mg   : 10 days of medicine on hand   Envarsus 4mg   : 10 days of medicine on hand   Mycophenolate 180mg   : 0 days of medicine on hand       Medication Adherence    Patient reported X missed doses in the last month: all  Specialty Medication: mycophenolate 180 MG EC tablet (MYFORTIC)  Patient is on additional specialty medications: Yes  Additional Specialty Medications: tacrolimus: ENVARSUS XR 1 mg Tb24 extended release tablet  Patient Reported Additional Medication X Missed Doses in the Last  Month: 0  Patient is on more than two specialty medications: Yes  Specialty Medication: tacrolimus: ENVARSUS XR 4 mg Tb24 extended release tablet  Patient Reported Additional Medication X Missed Doses in the Last Month: 0          Specialty medication(s) dose(s) confirmed: Regimen is correct and unchanged.     Are there any concerns with adherence?  \see notes above re: communication with clinic    Adherence counseling provided?  See notes above    CLINICAL MANAGEMENT AND INTERVENTION      Clinical Benefit Assessment:    Do you feel the medicine is effective or helping your condition? Yes    Clinical Benefit counseling provided? Not needed    Adverse Effects Assessment:    Are you experiencing any side effects? No    Are you experiencing difficulty administering your medicine? 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 For example, brushing your teeth or getting up in the morning. 0    Have you discussed this with your provider? Not needed    Acute Infection Status:    Acute infections noted within Epic:  No active infections  Patient reported infection: None    Therapy Appropriateness:    Is therapy appropriate based on current medication list, adverse reactions, adherence, clinical benefit and progress toward achieving therapeutic goals? Yes, therapy is appropriate and should be continued     DISEASE/MEDICATION-SPECIFIC INFORMATION      N/A    Solid Organ Transplant: Not Applicable    PATIENT SPECIFIC NEEDS     Does the patient have any physical, cognitive, or cultural barriers? No    Is the patient high risk? Yes, patient is taking a REMS drug. Medication is dispensed in compliance with REMS program    Did the patient require a clinical intervention? No    Does the patient require physician intervention or other additional services (i.e., nutrition, smoking cessation, social work)? No    SOCIAL DETERMINANTS OF HEALTH     At the [facility], we have learned that life circumstances - like trouble affording food, housing, utilities, or transportation can affect the health of many of our patients.   That is why we wanted to ask: are you currently experiencing any life circumstances that are negatively impacting your health and/or quality of life? Patient declined to answer    Social Drivers of Health     Food Insecurity: No Food Insecurity (03/26/2021)    Hunger Vital Sign     Worried About Running Out of Food in the Last Year: Never true     Ran Out of Food in the Last Year: Never true   Internet Connectivity: Not on file   Housing/Utilities: Low Risk  (03/26/2021)    Housing/Utilities     Within the past 12 months, have you ever stayed: outside, in a car, in a tent, in an overnight shelter, or temporarily in someone else's home (i.e. couch-surfing)?: No     Are you worried about losing your housing?: No     Within the past 12 months, have you been unable to get utilities (heat, electricity) when it was really needed?: No   Tobacco Use: High Risk (09/05/2022)    Patient History  Smoking Tobacco Use: Former     Smokeless Tobacco Use: Current     Passive Exposure: Not on file   Transportation Needs: No Transportation Needs (03/26/2021)    PRAPARE - Therapist, art (Medical): No     Lack of Transportation (Non-Medical): No   Alcohol Use: Not At Risk (03/26/2021)    Alcohol Use     How often do you have a drink containing alcohol?: 2 - 4 times per month     How many drinks containing alcohol do you have on a typical day when you are drinking?: 3 - 4     How often do you have 5 or more drinks on one occasion?: Never   Interpersonal Safety: Not on file   Physical Activity: Inactive (03/26/2021)    Exercise Vital Sign     Days of Exercise per Week: 0 days     Minutes of Exercise per Session: 0 min   Intimate Partner Violence: Not At Risk (03/26/2021)    Humiliation, Afraid, Rape, and Kick questionnaire     Fear of Current or Ex-Partner: No     Emotionally Abused: No     Physically Abused: No     Sexually Abused: No   Stress: Stress Concern Present (03/26/2021)    [employer] of Occupational Health - Occupational Stress Questionnaire     Feeling of Stress : Rather much   Substance Use: Not on file (04/26/2023)   Social Connections: Socially Isolated (03/26/2021)    Social Connection and Isolation Panel [NHANES]     Frequency of Communication with Friends and Family: More than three times a week     Frequency of Social Gatherings with Friends and Family: More than three times a week     Attends Religious Services: Never     Database administrator or Organizations: No     Attends Banker Meetings: Never     Marital Status: Separated   Physicist, medical Strain: Medium Risk (03/26/2021)    Overall Financial Resource Strain (CARDIA)     Difficulty of Paying Living Expenses: Somewhat hard   Depression: Not at risk (04/04/2021)    PHQ-2     PHQ-2 Score: 0   Health Literacy: Low Risk  (03/26/2021)    Health Literacy     : Never       Would you be willing to receive help with any of the needs that you have identified today? Not applicable       SHIPPING     Specialty Medication(s) to be Shipped:   N/a    Other medication(s) to be shipped: No additional medications requested for fill at this time     Changes to insurance: No    Delivery Scheduled: Patient declined refill at this time due to filling at outside pharmacy.     Medication will be delivered via  n/a  to the confirmed  n/a  address in [facility].    The patient will receive a drug information handout for each medication shipped and additional FDA Medication Guides as required.  Verified that patient has previously received a Conservation officer, historic buildings and a Surveyor, mining.    The patient or caregiver noted above participated in the development of this care plan and knows that they can request review of or adjustments to the care plan at any time.      All of the patient's questions and concerns have been addressed.  [name], PharmD   [facility] Specialty Pharmacist

## 2023-06-24 NOTE — Unmapped (Addendum)
1/8 update: wam states PAP application incomplete, however patient said when he filled it out he didn't qualify so didn't complete it and still won't qualify. This info sent on to clinic as well -ef      Specialty Medication(s): envarsus, mycophenolate, maintenance meds    Russell Wade has been dis-enrolled from the [employer] [facility] specialty pharmacy services due to a pharmacy change. The patient is now filling at he is unsure name of pharmacy but said his clinic knows and that the pharmacy mails the meds to him .    Additional information provided to the patient: patient aware to contact us or clinic if he would like [facility] to fill in the future.    [name], PharmD  [facility] Specialty Pharmacist

## 2023-12-31 DIAGNOSIS — Z940 Kidney transplant status: Principal | ICD-10-CM

## 2024-02-18 DIAGNOSIS — D849 Immunodeficiency, unspecified: Principal | ICD-10-CM

## 2024-02-18 DIAGNOSIS — Z940 Kidney transplant status: Principal | ICD-10-CM

## 2024-02-18 MED ORDER — TACROLIMUS XR 1 MG TABLET,EXTENDED RELEASE 24 HR
ORAL_TABLET | Freq: Every day | ORAL | 0 refills | 30.00000 days | Status: CP
Start: 2024-02-18 — End: ?

## 2024-02-18 MED ORDER — ENVARSUS XR 1 MG TABLET,EXTENDED RELEASE
ORAL_TABLET | 0.00000 days
Start: 2024-02-18 — End: ?

## 2024-02-18 MED ORDER — ENVARSUS XR 4 MG TABLET,EXTENDED RELEASE
ORAL_TABLET | 0.00000 days
Start: 2024-02-18 — End: ?

## 2024-02-18 MED ORDER — TACROLIMUS XR 4 MG TABLET,EXTENDED RELEASE 24 HR
ORAL_TABLET | Freq: Every day | ORAL | 0 refills | 30.00000 days | Status: CP
Start: 2024-02-18 — End: 2025-02-17

## 2024-02-25 ENCOUNTER — Encounter (HOSPITAL_COMMUNITY): Payer: Self-pay | Admitting: *Deleted

## 2024-02-29 ENCOUNTER — Ambulatory Visit: Payer: Self-pay | Attending: Obstetrics and Gynecology

## 2024-02-29 MED ORDER — ENVARSUS XR 1 MG TABLET,EXTENDED RELEASE
ORAL_TABLET | ORAL | 3 refills | 0.00000 days | Status: CP
Start: 2024-02-29 — End: ?

## 2024-02-29 MED ORDER — ENVARSUS XR 4 MG TABLET,EXTENDED RELEASE
ORAL_TABLET | ORAL | 3 refills | 0.00000 days | Status: CP
Start: 2024-02-29 — End: ?

## 2024-03-10 DIAGNOSIS — Z940 Kidney transplant status: Principal | ICD-10-CM

## 2024-03-10 MED ORDER — MYCOPHENOLATE SODIUM 180 MG TABLET,DELAYED RELEASE
ORAL_TABLET | Freq: Two times a day (BID) | ORAL | 1 refills | 30.00000 days | Status: CP
Start: 2024-03-10 — End: 2024-04-09

## 2024-03-13 ENCOUNTER — Telehealth: Payer: Self-pay | Admitting: Family

## 2024-03-13 DIAGNOSIS — A084 Viral intestinal infection, unspecified: Secondary | ICD-10-CM

## 2024-03-13 MED ORDER — ONDANSETRON HCL 4 MG PO TABS: 4.0000 mg | ORAL_TABLET | Freq: Three times a day (TID) | ORAL | 0 refills | Status: AC | PRN

## 2024-03-13 NOTE — Progress Notes (Signed)
 E-Visit for Diarrhea  We are sorry that you are not feeling well.  Here is how we plan to help!  Based on what you have shared with me it looks like you have Acute Infectious Diarrhea.  Most cases of acute diarrhea are due to infections with virus and bacteria and are self-limited conditions lasting less than 14 days.  For your symptoms you may take Imodium 2 mg tablets that are over the counter at your local pharmacy. Take two tablet now and then one after each loose stool up to 6 a day.  Antibiotics are not needed for most people with diarrhea.   Zofran  4 mg 1 tablet every 8 hours as needed for nausea and vomiting    HOME CARE We recommend changing your diet to help with your symptoms for the next few days. Drink plenty of fluids that contain water salt and sugar. Sports drinks such as Gatorade may help.  You may try broths, soups, bananas, applesauce, soft breads, mashed potatoes or crackers.  You are considered infectious for as long as the diarrhea continues. Hand washing or use of alcohol based hand sanitizers is recommend. It is best to stay out of work or school until your symptoms stop.   GET HELP RIGHT AWAY If you have dark yellow colored urine or do not pass urine frequently you should drink more fluids.   If your symptoms worsen  If you feel like you are going to pass out (faint) You have a new problem  MAKE SURE YOU  Understand these instructions. Will watch your condition. Will get help right away if you are not doing well or get worse.  Thank you for choosing an e-visit.  Your e-visit answers were reviewed by a board certified advanced clinical practitioner to complete your personal care plan. Depending upon the condition, your plan could have included both over the counter or prescription medications.  Please review your pharmacy choice. Make sure the pharmacy is open so you can pick up prescription now. If there is a problem, you may contact your provider through  [employer] and have the prescription routed to another pharmacy.  Your safety is important to us . If you have drug allergies check your prescription carefully.   For the next 24 hours you can use MyChart to ask questions about today's visit, request a non-urgent call back, or ask for a work or school excuse. You will get an email in the next two days asking about your experience. I hope that your e-visit has been valuable and will speed your recovery.

## 2024-03-13 NOTE — Progress Notes (Signed)
Approximately 5 minutes was spent documenting and reviewing patient's chart.

## 2024-03-17 DIAGNOSIS — Z940 Kidney transplant status: Principal | ICD-10-CM

## 2024-03-18 ENCOUNTER — Encounter (HOSPITAL_COMMUNITY): Payer: Self-pay | Admitting: *Deleted

## 2024-03-24 ENCOUNTER — Telehealth: Payer: Self-pay

## 2024-03-24 NOTE — Telephone Encounter (Addendum)
 I spoke with the patient and his partner Mychal Isley DOB 12/12/1989 during a genetic counseling visit to review his Myriad carrier screening results.  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  This gene is located on the X chromosome; therefore, he has the X-linked form of Alport syndrome. He was negative for pathogenic variants in the COL4A3 and COL4A4 genes. Please review his report for details.   We reviewed that since he has X-linked Alport syndrome, it is expected that all of his daughters would be affected including this male fetus.  None of his sons would inherit this variant from him.  He may request a referral to medical genetics from his PCP for further review of his test reports and management of his diagnosis. He is currently followed at [facility] for Alport syndrome and is comfortable with his care team.   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 Please see report for details. We discussed that this couple's current and future pregnancies are not at an increased risk for alpha thalassemia, including hemoglobin Bart's disease. Based on the results, there is a 25% chance the current pregnancy would be an alpha thalassemia carrier in the trans configuration (-?/-?). There is a 50% chance the pregnancy would be silent carrier for alpha thalassemia (??/-?). There is a 25% chance the pregnancy would be unaffected and not a carrier (??/??) as Mychal is also a silent carrier due to the 3.7 deletion.   Sickle cell trait: FOB was also found to have sickle cell trait; however, Mychal is not a carrier and the couple's current and future pregnancies have a low risk of having a beta-hemoglobinopathy including sickle cell disease.  Lauraine Bodily, MS, [facility] Certified Genetic Counselor [facility] [phone]

## 2024-04-19 DIAGNOSIS — N184 Chronic kidney disease, stage 4 (severe): Principal | ICD-10-CM

## 2024-04-19 DIAGNOSIS — E119 Type 2 diabetes mellitus without complications: Principal | ICD-10-CM

## 2024-04-19 DIAGNOSIS — Z940 Kidney transplant status: Principal | ICD-10-CM

## 2024-04-19 DIAGNOSIS — Z79899 Other long term (current) drug therapy: Principal | ICD-10-CM
